# Patient Record
Sex: Male | Born: 1970 | Race: White | Hispanic: No | Smoking: Never smoker
Health system: Southern US, Community
[De-identification: ages and names within clinical notes are randomized; demographics above are authoritative.]

## PROBLEM LIST (undated history)

## (undated) DIAGNOSIS — K5903 Drug induced constipation: Secondary | ICD-10-CM

## (undated) DIAGNOSIS — G809 Cerebral palsy, unspecified: Secondary | ICD-10-CM

## (undated) DIAGNOSIS — F419 Anxiety disorder, unspecified: Secondary | ICD-10-CM

## (undated) DIAGNOSIS — J189 Pneumonia, unspecified organism: Secondary | ICD-10-CM

## (undated) DIAGNOSIS — K224 Dyskinesia of esophagus: Secondary | ICD-10-CM

## (undated) DIAGNOSIS — J45909 Unspecified asthma, uncomplicated: Secondary | ICD-10-CM

## (undated) DIAGNOSIS — L0390 Cellulitis, unspecified: Secondary | ICD-10-CM

## (undated) DIAGNOSIS — G8250 Quadriplegia, unspecified: Secondary | ICD-10-CM

## (undated) DIAGNOSIS — I5032 Chronic diastolic (congestive) heart failure: Secondary | ICD-10-CM

## (undated) DIAGNOSIS — Z9109 Other allergy status, other than to drugs and biological substances: Secondary | ICD-10-CM

## (undated) DIAGNOSIS — K222 Esophageal obstruction: Secondary | ICD-10-CM

## (undated) DIAGNOSIS — I10 Essential (primary) hypertension: Secondary | ICD-10-CM

## (undated) DIAGNOSIS — Z9229 Personal history of other drug therapy: Secondary | ICD-10-CM

## (undated) DIAGNOSIS — E119 Type 2 diabetes mellitus without complications: Secondary | ICD-10-CM

## (undated) DIAGNOSIS — J302 Other seasonal allergic rhinitis: Secondary | ICD-10-CM

## (undated) DIAGNOSIS — K219 Gastro-esophageal reflux disease without esophagitis: Secondary | ICD-10-CM

## (undated) HISTORY — DX: Anxiety disorder, unspecified: F41.9

## (undated) HISTORY — PX: ESOPHAGUS SURGERY: SHX626

## (undated) HISTORY — DX: Cerebral palsy, unspecified: G80.9

## (undated) HISTORY — PX: TENDON RELEASE: SHX230

## (undated) HISTORY — DX: Drug induced constipation: K59.03

## (undated) HISTORY — DX: Unspecified asthma, uncomplicated: J45.909

## (undated) HISTORY — DX: Dyskinesia of esophagus: K22.4

## (undated) HISTORY — DX: Esophageal obstruction: K22.2

## (undated) HISTORY — DX: Quadriplegia, unspecified: G82.50

## (undated) HISTORY — PX: MOUTH SURGERY: SHX715

## (undated) HISTORY — PX: OTHER SURGICAL HISTORY: SHX169

---

## 1999-06-12 ENCOUNTER — Ambulatory Visit (HOSPITAL_COMMUNITY): Admission: RE | Admit: 1999-06-12 | Discharge: 1999-06-12 | Payer: Self-pay | Admitting: *Deleted

## 2005-06-17 ENCOUNTER — Encounter: Admission: RE | Admit: 2005-06-17 | Discharge: 2005-07-24 | Payer: Self-pay | Admitting: Internal Medicine

## 2005-10-14 ENCOUNTER — Encounter: Admission: RE | Admit: 2005-10-14 | Discharge: 2005-10-14 | Payer: Self-pay | Admitting: Unknown Physician Specialty

## 2007-07-14 ENCOUNTER — Encounter: Admission: RE | Admit: 2007-07-14 | Discharge: 2007-10-12 | Payer: Self-pay | Admitting: Occupational Therapy

## 2007-12-23 ENCOUNTER — Encounter: Admission: RE | Admit: 2007-12-23 | Discharge: 2008-03-15 | Payer: Self-pay | Admitting: Internal Medicine

## 2008-09-07 ENCOUNTER — Encounter: Admission: RE | Admit: 2008-09-07 | Discharge: 2008-12-06 | Payer: Self-pay | Admitting: Ophthalmology

## 2009-06-21 ENCOUNTER — Ambulatory Visit: Payer: Self-pay | Admitting: Family Medicine

## 2009-07-26 ENCOUNTER — Ambulatory Visit: Payer: Self-pay | Admitting: Internal Medicine

## 2009-08-13 ENCOUNTER — Encounter
Admission: RE | Admit: 2009-08-13 | Discharge: 2009-09-09 | Payer: Self-pay | Admitting: Physical Medicine and Rehabilitation

## 2009-08-26 ENCOUNTER — Ambulatory Visit: Payer: Self-pay | Admitting: Internal Medicine

## 2009-08-26 ENCOUNTER — Encounter (INDEPENDENT_AMBULATORY_CARE_PROVIDER_SITE_OTHER): Payer: Self-pay | Admitting: Family Medicine

## 2009-08-26 LAB — CONVERTED CEMR LAB
ALT: 79 units/L — ABNORMAL HIGH (ref 0–53)
AST: 41 units/L — ABNORMAL HIGH (ref 0–37)
Albumin: 4.6 g/dL (ref 3.5–5.2)
Alkaline Phosphatase: 78 units/L (ref 39–117)
BUN: 16 mg/dL (ref 6–23)
Basophils Absolute: 0 10*3/uL (ref 0.0–0.1)
Basophils Relative: 0 % (ref 0–1)
CO2: 28 meq/L (ref 19–32)
Calcium: 9.8 mg/dL (ref 8.4–10.5)
Chloride: 102 meq/L (ref 96–112)
Cholesterol: 199 mg/dL (ref 0–200)
Creatinine, Ser: 0.63 mg/dL (ref 0.40–1.50)
Eosinophils Absolute: 0.1 10*3/uL (ref 0.0–0.7)
Eosinophils Relative: 2 % (ref 0–5)
Glucose, Bld: 100 mg/dL — ABNORMAL HIGH (ref 70–99)
HCT: 47.7 % (ref 39.0–52.0)
HDL: 29 mg/dL — ABNORMAL LOW (ref >39)
Hemoglobin: 15.5 g/dL (ref 13.0–17.0)
LDL Cholesterol: 127 mg/dL — ABNORMAL HIGH (ref 0–99)
Lymphocytes Relative: 35 % (ref 12–46)
Lymphs Abs: 2.5 10*3/uL (ref 0.7–4.0)
MCHC: 32.5 g/dL (ref 30.0–36.0)
MCV: 90.7 fL (ref 78.0–100.0)
Monocytes Absolute: 0.5 10*3/uL (ref 0.1–1.0)
Monocytes Relative: 6 % (ref 3–12)
Neutro Abs: 4.1 10*3/uL (ref 1.7–7.7)
Neutrophils Relative %: 57 % (ref 43–77)
Platelets: 218 10*3/uL (ref 150–400)
Potassium: 3.8 meq/L (ref 3.5–5.3)
RBC: 5.26 M/uL (ref 4.22–5.81)
RDW: 13.1 % (ref 11.5–15.5)
Sodium: 142 meq/L (ref 135–145)
Total Bilirubin: 0.5 mg/dL (ref 0.3–1.2)
Total CHOL/HDL Ratio: 6.9
Total Protein: 7.3 g/dL (ref 6.0–8.3)
Triglycerides: 215 mg/dL — ABNORMAL HIGH (ref <150)
VLDL: 43 mg/dL — ABNORMAL HIGH (ref 0–40)
WBC: 7.2 10*3/uL (ref 4.0–10.5)

## 2010-04-17 ENCOUNTER — Encounter (INDEPENDENT_AMBULATORY_CARE_PROVIDER_SITE_OTHER): Payer: Self-pay | Admitting: *Deleted

## 2010-04-17 LAB — CONVERTED CEMR LAB
ALT: 21 units/L (ref 0–53)
AST: 17 units/L (ref 0–37)
Albumin: 4.5 g/dL (ref 3.5–5.2)
Alkaline Phosphatase: 72 units/L (ref 39–117)
BUN: 15 mg/dL (ref 6–23)
CO2: 27 meq/L (ref 19–32)
Calcium: 9.8 mg/dL (ref 8.4–10.5)
Chloride: 104 meq/L (ref 96–112)
Cholesterol: 194 mg/dL (ref 0–200)
Cortisol, Plasma: 7.5 ug/dL
Creatinine, Ser: 0.63 mg/dL (ref 0.40–1.50)
Glucose, Bld: 87 mg/dL (ref 70–99)
HDL: 27 mg/dL — ABNORMAL LOW (ref >39)
LDL Cholesterol: 132 mg/dL — ABNORMAL HIGH (ref 0–99)
Magnesium: 1.9 mg/dL (ref 1.5–2.5)
Phosphorus: 2.8 mg/dL (ref 2.3–4.6)
Potassium: 3.7 meq/L (ref 3.5–5.3)
Sodium: 143 meq/L (ref 135–145)
TSH: 0.564 microintl units/mL (ref 0.350–4.500)
Total Bilirubin: 0.6 mg/dL (ref 0.3–1.2)
Total CHOL/HDL Ratio: 7.2
Total Protein: 7 g/dL (ref 6.0–8.3)
Triglycerides: 173 mg/dL — ABNORMAL HIGH (ref <150)
VLDL: 35 mg/dL (ref 0–40)
Vitamin B-12: >2000 pg/mL — ABNORMAL HIGH (ref 211–911)

## 2010-04-18 ENCOUNTER — Encounter: Payer: Self-pay | Admitting: Internal Medicine

## 2010-07-21 ENCOUNTER — Other Ambulatory Visit (HOSPITAL_COMMUNITY): Payer: Self-pay | Admitting: Family Medicine

## 2010-07-21 DIAGNOSIS — R1310 Dysphagia, unspecified: Secondary | ICD-10-CM

## 2010-07-23 ENCOUNTER — Ambulatory Visit (HOSPITAL_COMMUNITY)
Admission: RE | Admit: 2010-07-23 | Discharge: 2010-07-23 | Disposition: A | Discharge status: Home / Self Care | Payer: Medicare Other | Source: Ambulatory Visit | Attending: Family Medicine | Admitting: Family Medicine

## 2010-07-23 DIAGNOSIS — K224 Dyskinesia of esophagus: Secondary | ICD-10-CM | POA: Insufficient documentation

## 2010-07-23 DIAGNOSIS — R1310 Dysphagia, unspecified: Secondary | ICD-10-CM

## 2010-08-06 ENCOUNTER — Encounter: Payer: Self-pay | Admitting: Gastroenterology

## 2010-09-16 ENCOUNTER — Ambulatory Visit (INDEPENDENT_AMBULATORY_CARE_PROVIDER_SITE_OTHER): Payer: Medicare Other | Admitting: Gastroenterology

## 2010-09-16 ENCOUNTER — Encounter: Payer: Self-pay | Admitting: Gastroenterology

## 2010-09-16 DIAGNOSIS — R1310 Dysphagia, unspecified: Secondary | ICD-10-CM | POA: Insufficient documentation

## 2010-09-16 DIAGNOSIS — G809 Cerebral palsy, unspecified: Secondary | ICD-10-CM

## 2010-09-16 NOTE — Progress Notes (Signed)
History of Present Illness:  Thomas Ramos is a 40 year old white male with cerebral palsy referred at the request of Dr. Clelia Croft for evaluation of dysphagia. He apparently has had dysphagia to both solids and liquids over the past year. He denies pyrosis. Recent barium swallow, which I reviewed, demonstrated a dilated esophagus with a narrowed fixed area in the distal esophagus. There was hangup of the barium pill. Questionable filling defects are also seen in the distal esophagus. Weight has been stable.    Review of Systems: Pertinent positive and negative review of systems were noted in the above HPI section. All other review of systems were otherwise negative.    Current Medications, Allergies, Past Medical History, Past Surgical History, Family History and Social History were reviewed in [employer] electronic medical record  Vital signs were reviewed in today's medical record. Physical Exam: General: Well developed , well nourished, no acute distress with some contractures sitting in a wheelchair Head: Normocephalic and atraumatic Eyes:  sclerae anicteric, EOMI Ears: Normal auditory acuity Mouth: No deformity or lesions Lungs: Clear throughout to auscultation Heart: Regular rate and rhythm; no murmurs, rubs or bruits Abdomen: Soft, non tender and non distended. No masses, hepatosplenomegaly or hernias noted. Normal Bowel sounds Rectal:deferred Musculoskeletal: Symmetrical with no gross deformities  Pulses:  Normal pulses noted Extremities: No clubbing, cyanosis, edema; there is some contractures of his upper and lower extremities Neurological: Alert oriented x 4, grossly nonfocal Psychological:  Alert and cooperative. Normal mood and affect

## 2010-09-16 NOTE — Assessment & Plan Note (Addendum)
I suspect the patient may have achalasia. Secondary achalasia from a neoplasm needs to be ruled out in view of a possible mucosal abnormality seen by barium swallow. A fixed esophageal stricture is less likely.  Recommendations #1 upper endoscopy with possible Botox injection versus balloon dilatation.  He will require sedation with propofol  Risks, alternatives, and complications of the procedure, including bleeding, perforation, and possible need for surgery, were explained to the patient.  Patient's questions were answered.

## 2010-10-28 ENCOUNTER — Ambulatory Visit (HOSPITAL_COMMUNITY)
Admission: RE | Admit: 2010-10-28 | Discharge: 2010-10-28 | Disposition: A | Discharge status: Home / Self Care | Payer: Medicare Other | Source: Ambulatory Visit | Attending: Gastroenterology | Admitting: Gastroenterology

## 2010-10-28 ENCOUNTER — Encounter: Payer: Medicare Other | Admitting: Gastroenterology

## 2010-10-28 DIAGNOSIS — G809 Cerebral palsy, unspecified: Secondary | ICD-10-CM | POA: Insufficient documentation

## 2010-10-28 DIAGNOSIS — K222 Esophageal obstruction: Secondary | ICD-10-CM | POA: Insufficient documentation

## 2010-10-28 DIAGNOSIS — I10 Essential (primary) hypertension: Secondary | ICD-10-CM | POA: Insufficient documentation

## 2010-10-28 DIAGNOSIS — R1310 Dysphagia, unspecified: Secondary | ICD-10-CM | POA: Insufficient documentation

## 2010-10-28 DIAGNOSIS — Z79899 Other long term (current) drug therapy: Secondary | ICD-10-CM | POA: Insufficient documentation

## 2010-10-28 DIAGNOSIS — K220 Achalasia of cardia: Secondary | ICD-10-CM

## 2010-12-08 ENCOUNTER — Encounter: Payer: Self-pay | Admitting: Gastroenterology

## 2010-12-08 ENCOUNTER — Ambulatory Visit (INDEPENDENT_AMBULATORY_CARE_PROVIDER_SITE_OTHER): Payer: Medicare Other | Admitting: Gastroenterology

## 2010-12-08 VITALS — BP 102/76 | HR 56

## 2010-12-08 DIAGNOSIS — R1310 Dysphagia, unspecified: Secondary | ICD-10-CM

## 2010-12-08 NOTE — Progress Notes (Signed)
Thomas Ramos has returned following Botox injection of his LES. Endoscopic findings were most compatible with achalasia. He reports significant improvement in his swallowing since the injection.

## 2010-12-08 NOTE — Patient Instructions (Signed)
Follow up as needed

## 2010-12-08 NOTE — Assessment & Plan Note (Addendum)
He probably has achalasia. Findings were discussed and therapeutic options were reviewed including repeat Botox injection, forceful balloon dilatation of the LES and surgical myotomy. It was decided to repeat his Botox injection as needed for the time being, in view of his other medical problems. Should he require frequent injections, that is every 3 to 4 months, then I would consider a more permanent therapy by surgical myotomy.

## 2011-11-17 ENCOUNTER — Other Ambulatory Visit: Payer: Self-pay | Admitting: Dentistry

## 2011-11-17 NOTE — Progress Notes (Signed)
1630 Tuesday.Marland KitchenMarland KitchenI have called the office requesting "consent to read" and labs...Marland KitchenDA

## 2011-11-17 NOTE — Pre-Procedure Instructions (Addendum)
20 Thomas Ramos   11/17/2011   Your procedure is scheduled on: Tuesday, September 10th   Report to [facility] at  5:30 AM.   Call this number if you have problems the morning of surgery: [phone]   Remember:   Do not eat food or drink any liquids:After Midnight Monday   Take these medicines the morning of surgery with A SIP OF WATER: Proair, Celexa, Flovent,             Prilosec & Inderal   Do not wear jewelry,.Do not wear lotions, powders, or cologne. You may NOT wear deodorant.   Ladies--Do not shave 48 hours prior to surgery. Men may shave face and neck.   Do not bring valuables to the hospital.   Contacts, dentures or bridgework may not be worn into surgery.  Leave suitcase in the car. After surgery it may be brought to your room.  For patients admitted to the hospital, checkout time is 11:00 AM the day of discharge.   Patients discharged the day of surgery will not be allowed to drive home.              Someone will need to stay overnight with you for the first 24 hours.   Name and phone number of your driver:      Special Instructions: CHG Shower Use Special Wash: 1/2 bottle night before surgery and 1/2 bottle morning of surgery.   Please read over the following fact sheets that you were given: Pain Booklet, Coughing and Deep Breathing and Surgical Site Infection Prevention

## 2011-11-18 ENCOUNTER — Encounter (HOSPITAL_COMMUNITY)
Admission: RE | Admit: 2011-11-18 | Discharge: 2011-11-18 | Payer: Medicare Other | Source: Ambulatory Visit | Attending: Dentistry | Admitting: Dentistry

## 2011-11-19 ENCOUNTER — Encounter (HOSPITAL_COMMUNITY): Payer: Self-pay | Admitting: Pharmacy Technician

## 2011-11-19 ENCOUNTER — Encounter (HOSPITAL_COMMUNITY)
Admission: RE | Admit: 2011-11-19 | Discharge: 2011-11-19 | Disposition: A | Discharge status: Home / Self Care | Payer: Medicare Other | Source: Ambulatory Visit | Attending: Dentistry | Admitting: Dentistry

## 2011-11-19 ENCOUNTER — Encounter (HOSPITAL_COMMUNITY): Payer: Self-pay

## 2011-11-19 ENCOUNTER — Ambulatory Visit (HOSPITAL_COMMUNITY)
Admission: RE | Admit: 2011-11-19 | Discharge: 2011-11-19 | Disposition: A | Discharge status: Home / Self Care | Payer: Medicare Other | Source: Ambulatory Visit | Attending: Dentistry | Admitting: Dentistry

## 2011-11-19 ENCOUNTER — Ambulatory Visit (HOSPITAL_COMMUNITY): Admission: RE | Admit: 2011-11-19 | Payer: Medicare Other | Source: Ambulatory Visit

## 2011-11-19 DIAGNOSIS — Z01812 Encounter for preprocedural laboratory examination: Secondary | ICD-10-CM | POA: Insufficient documentation

## 2011-11-19 DIAGNOSIS — Z01818 Encounter for other preprocedural examination: Secondary | ICD-10-CM | POA: Insufficient documentation

## 2011-11-19 DIAGNOSIS — Z01810 Encounter for preprocedural cardiovascular examination: Secondary | ICD-10-CM | POA: Insufficient documentation

## 2011-11-19 HISTORY — DX: Essential (primary) hypertension: I10

## 2011-11-19 HISTORY — DX: Other allergy status, other than to drugs and biological substances: Z91.09

## 2011-11-19 HISTORY — DX: Other seasonal allergic rhinitis: J30.2

## 2011-11-19 HISTORY — DX: Gastro-esophageal reflux disease without esophagitis: K21.9

## 2011-11-19 HISTORY — DX: Pneumonia, unspecified organism: J18.9

## 2011-11-19 HISTORY — DX: Personal history of other drug therapy: Z92.29

## 2011-11-19 HISTORY — DX: Cerebral palsy, unspecified: G80.9

## 2011-11-19 LAB — BASIC METABOLIC PANEL
Chloride: 95 mEq/L — ABNORMAL LOW (ref 96–112)
Creatinine, Ser: 0.57 mg/dL (ref 0.50–1.35)
GFR calc Af Amer: >90 mL/min (ref >90)
Potassium: 2.9 mEq/L — ABNORMAL LOW (ref 3.5–5.1)
Sodium: 137 mEq/L (ref 135–145)

## 2011-11-19 LAB — CBC
HCT: 42.7 % (ref 39.0–52.0)
RBC: 5.1 MIL/uL (ref 4.22–5.81)
RDW: 12.3 % (ref 11.5–15.5)
WBC: 8.8 10*3/uL (ref 4.0–10.5)

## 2011-11-19 NOTE — Progress Notes (Signed)
PCP was Dr. Clelia Croft # [phone] however office has closed and Nurse was unable to obtain records. Patients new PCP will be Georgette Shell, PA-C and patient has a upcoming appointment.

## 2011-11-19 NOTE — Pre-Procedure Instructions (Signed)
20 Thomas Ramos  11/19/2011   Your procedure is scheduled on:  Tuesday November 24, 2011.  Report to [facility] at 0530 AM.  Call this number if you have problems the morning of surgery: [phone]   Remember:   Do not eat food or drink:After Midnight.    Take these medicines the morning of surgery with A SIP OF WATER: Albuterol inhaler, Amlodipine (Norvasc), Citalopram (Celexa), Flovent inhaler, Omeprazole (Prilosec), Propranolol (Inderal)   Do not wear jewelry  Do not wear lotions or colognes.  Men may shave face and neck.  Do not bring valuables to the hospital.  Contacts, dentures or bridgework may not be worn into surgery.  Leave suitcase in the car. After surgery it may be brought to your room.  For patients admitted to the hospital, checkout time is 11:00 AM the day of discharge.   Patients discharged the day of surgery will not be allowed to drive home.  Name and phone number of your driver:   Special Instructions: CHG Shower Use Special Wash: 1/2 bottle night before surgery and 1/2 bottle morning of surgery.   Please read over the following fact sheets that you were given: Pain Booklet, Coughing and Deep Breathing, MRSA Information and Surgical Site Infection Prevention

## 2011-11-19 NOTE — Progress Notes (Signed)
Nurse called Dr. Janyce Llanos office and spoke with physician and informed him that patient did not have orders in EPIC.

## 2011-11-20 NOTE — Consult Note (Signed)
Anesthesia Chart Review:  Patient is a 41 year old male posted dental restoration/extraction with xray teeth by Dr. Martie Round on 11/24/11.  History includes former smoker, HTN, GERD, cerebral palsy, spastic quadriplegia.  Neurology notes indicate that he is wheelchair bound, but he can feed himself and go to the bathroom with assistance.  He saw Dr. Levert Feinstein in January of this year for Botox injections in his RUE from spasticity.  He has also seen Dr. Arlyce Dice for endoscopic findings most compatible with achalasia that has also been treated with Botox last September.  Labs noted.  K 2.9.  Cr 0.57.  Will get an ISTAT on arrival to re-evaluate for hypokalemia.    CXR on 11/19/11 showed no acute cardiopulmonary disease.  EKG on 11/19/11 showed NSR, anterior T wave abnormality, consider ischemia.  He had T wave inversion in V1-2 and a flat T wave in V3 on EKG on 06/12/99.  He now has a negative T wave in V3, but otherwise his EKG appears stable.  I spoke with patient's PAT RN.  Patient was previously seen by Dr. Clelia Croft, but that office is now closed (? [facility]).  He is scheduled to see Georgette Shell, PA-C [phone]) on 11/23/11 @ 1445 to get established and for his preoperative H&P.  I have faxed his labs, EKG, and CXR for her review. Chales Abrahams at Dr. Enrique Sack office is aware.  If no contraindications for surgery identified during his pre-operative H&P visit and his follow-up K+ is reasonable, then anticipate he can proceed as planned.  Reviewed with Anesthesiologist Dr. Malen Gauze.  Shonna Chock, PA-C

## 2011-11-24 ENCOUNTER — Encounter (HOSPITAL_COMMUNITY): Payer: Self-pay | Admitting: Vascular Surgery

## 2011-11-24 ENCOUNTER — Ambulatory Visit (HOSPITAL_COMMUNITY): Payer: Medicare Other | Admitting: Vascular Surgery

## 2011-11-24 ENCOUNTER — Encounter (HOSPITAL_COMMUNITY)
Admission: RE | Disposition: A | Discharge status: Home / Self Care | Payer: Self-pay | Source: Ambulatory Visit | Attending: Dentistry

## 2011-11-24 ENCOUNTER — Ambulatory Visit (HOSPITAL_COMMUNITY)
Admission: RE | Admit: 2011-11-24 | Discharge: 2011-11-24 | Disposition: A | Discharge status: Home / Self Care | Payer: Medicare Other | Source: Ambulatory Visit | Attending: Dentistry | Admitting: Dentistry

## 2011-11-24 DIAGNOSIS — K219 Gastro-esophageal reflux disease without esophagitis: Secondary | ICD-10-CM | POA: Insufficient documentation

## 2011-11-24 DIAGNOSIS — F79 Unspecified intellectual disabilities: Secondary | ICD-10-CM | POA: Insufficient documentation

## 2011-11-24 DIAGNOSIS — K029 Dental caries, unspecified: Secondary | ICD-10-CM | POA: Insufficient documentation

## 2011-11-24 DIAGNOSIS — I10 Essential (primary) hypertension: Secondary | ICD-10-CM | POA: Insufficient documentation

## 2011-11-24 DIAGNOSIS — F88 Other disorders of psychological development: Secondary | ICD-10-CM | POA: Insufficient documentation

## 2011-11-24 LAB — POCT I-STAT 4, (NA,K, GLUC, HGB,HCT): Potassium: 3.3 mEq/L — ABNORMAL LOW (ref 3.5–5.1)

## 2011-11-24 SURGERY — DENTAL RESTORATION/EXTRACTION WITH X-RAY
Anesthesia: General | Site: Mouth | Wound class: Clean Contaminated

## 2011-11-24 MED ORDER — KETOROLAC TROMETHAMINE 30 MG/ML IJ SOLN
30.0000 mg | INTRAMUSCULAR | Status: DC
  Filled 2011-11-24: qty 1

## 2011-11-24 MED ORDER — LIDOCAINE-EPINEPHRINE 2 %-1:100000 IJ SOLN
INTRAMUSCULAR | Status: DC | PRN
  Administered 2011-11-24: 5 mL

## 2011-11-24 MED ORDER — KETOROLAC TROMETHAMINE 30 MG/ML IJ SOLN
INTRAMUSCULAR | Status: DC | PRN
Start: 2011-11-24 — End: 2011-11-24
  Administered 2011-11-24: 30 mg via INTRAVENOUS

## 2011-11-24 MED ORDER — SUCCINYLCHOLINE CHLORIDE 20 MG/ML IJ SOLN
INTRAMUSCULAR | Status: DC | PRN
  Administered 2011-11-24: 140 mg via INTRAVENOUS

## 2011-11-24 MED ORDER — ONDANSETRON HCL 4 MG/2ML IJ SOLN
INTRAMUSCULAR | Status: DC | PRN
  Administered 2011-11-24: 4 mg via INTRAVENOUS

## 2011-11-24 MED ORDER — CHLORHEXIDINE GLUCONATE 0.12 % MT SOLN
OROMUCOSAL | Status: DC | PRN
  Administered 2011-11-24: 15 mL via OROMUCOSAL

## 2011-11-24 MED ORDER — LIDOCAINE-EPINEPHRINE 2 %-1:100000 IJ SOLN
INTRAMUSCULAR | Status: AC
  Filled 2011-11-24: qty 1

## 2011-11-24 MED ORDER — OXYMETAZOLINE HCL 0.05 % NA SOLN
NASAL | Status: DC | PRN
  Administered 2011-11-24: 2 via NASAL

## 2011-11-24 MED ORDER — LIDOCAINE-EPINEPHRINE 2 %-1:100000 IJ SOLN
INTRAMUSCULAR | Status: AC
  Filled 2011-11-24: qty 3.4

## 2011-11-24 MED ORDER — LACTATED RINGERS IV SOLN
INTRAVENOUS | Status: DC | PRN
  Administered 2011-11-24: 08:00:00 via INTRAVENOUS

## 2011-11-24 MED ORDER — OXYMETAZOLINE HCL 0.05 % NA SOLN
NASAL | Status: DC | PRN
  Administered 2011-11-24: 1 via NASAL

## 2011-11-24 MED ORDER — CHLORHEXIDINE GLUCONATE 0.12 % MT SOLN
15.0000 mL | Freq: Once | OROMUCOSAL | Status: DC
  Filled 2011-11-24: qty 15

## 2011-11-24 MED ORDER — FENTANYL CITRATE 0.05 MG/ML IJ SOLN
INTRAMUSCULAR | Status: DC | PRN
  Administered 2011-11-24: 25 ug via INTRAVENOUS
  Administered 2011-11-24: 20 ug via INTRAVENOUS
  Administered 2011-11-24: 50 ug via INTRAVENOUS

## 2011-11-24 MED ORDER — PROPOFOL 10 MG/ML IV BOLUS
INTRAVENOUS | Status: DC | PRN
  Administered 2011-11-24: 200 mg via INTRAVENOUS

## 2011-11-24 MED ORDER — OXYMETAZOLINE HCL 0.05 % NA SOLN
NASAL | Status: AC
  Filled 2011-11-24: qty 15

## 2011-11-24 MED ORDER — HYDROMORPHONE HCL PF 1 MG/ML IJ SOLN: 0.2500 mg | INTRAMUSCULAR | Status: DC | PRN

## 2011-11-24 SURGICAL SUPPLY — 41 items
BLADE SURG 15 STRL LF DISP TIS (BLADE) IMPLANT
BLADE SURG 15 STRL SS (BLADE)
CANISTER SUCTION 2500CC (MISCELLANEOUS) ×2 IMPLANT
CLOTH BEACON ORANGE TIMEOUT ST (SAFETY) ×2 IMPLANT
CONT SPEC 4OZ CLIKSEAL STRL BL (MISCELLANEOUS) IMPLANT
COVER PROBE W GEL 5X96 (DRAPES) IMPLANT
COVER SURGICAL LIGHT HANDLE (MISCELLANEOUS) ×2 IMPLANT
COVER TABLE BACK 60X90 (DRAPES) ×2 IMPLANT
DECANTER SPIKE VIAL GLASS SM (MISCELLANEOUS) ×1 IMPLANT
DRAPE PROXIMA HALF (DRAPES) IMPLANT
ELECT COATED BLADE 2.86 ST (ELECTRODE) IMPLANT
ELECT REM PT RETURN 9FT ADLT (ELECTROSURGICAL)
ELECTRODE REM PT RTRN 9FT ADLT (ELECTROSURGICAL) IMPLANT
GAUZE PACKING FOLDED 2  STR (GAUZE/BANDAGES/DRESSINGS)
GAUZE PACKING FOLDED 2 STR (GAUZE/BANDAGES/DRESSINGS) IMPLANT
GAUZE SPONGE 2X2 8PLY STRL LF (GAUZE/BANDAGES/DRESSINGS) IMPLANT
GAUZE SPONGE 4X4 16PLY XRAY LF (GAUZE/BANDAGES/DRESSINGS) ×2 IMPLANT
GLOVE BIO SURGEON STRL SZ7.5 (GLOVE) ×2 IMPLANT
GOWN STRL NON-REIN LRG LVL3 (GOWN DISPOSABLE) ×4 IMPLANT
KIT BASIN OR (CUSTOM PROCEDURE TRAY) ×2 IMPLANT
KIT ROOM TURNOVER OR (KITS) ×2 IMPLANT
NDL 25GX 5/8IN NON SAFETY (NEEDLE) IMPLANT
NDL FILTER BLUNT 18X1 1/2 (NEEDLE) IMPLANT
NEEDLE 25GX 5/8IN NON SAFETY (NEEDLE) ×2 IMPLANT
NEEDLE 27GAX1X1/2 (NEEDLE) IMPLANT
NEEDLE FILTER BLUNT 18X 1/2SAF (NEEDLE)
NEEDLE FILTER BLUNT 18X1 1/2 (NEEDLE) IMPLANT
PAD ARMBOARD 7.5X6 YLW CONV (MISCELLANEOUS) ×4 IMPLANT
PENCIL BUTTON HOLSTER BLD 10FT (ELECTRODE) IMPLANT
SPONGE GAUZE 2X2 STER 10/PKG (GAUZE/BANDAGES/DRESSINGS)
SPONGE GAUZE 4X4 12PLY (GAUZE/BANDAGES/DRESSINGS) IMPLANT
SPONGE SURGIFOAM ABS GEL 12-7 (HEMOSTASIS) IMPLANT
SPONGE SURGIFOAM ABS GEL SZ50 (HEMOSTASIS) IMPLANT
SUT CHROMIC 3 0 PS 2 (SUTURE) ×1 IMPLANT
SYR CONTROL 10ML LL (SYRINGE) ×2 IMPLANT
TOOTHBRUSH ADULT (PERSONAL CARE ITEMS) ×1 IMPLANT
TOWEL OR 17X24 6PK STRL BLUE (TOWEL DISPOSABLE) IMPLANT
TOWEL OR 17X26 10 PK STRL BLUE (TOWEL DISPOSABLE) ×2 IMPLANT
TUBE CONNECTING 12X1/4 (SUCTIONS) ×2 IMPLANT
WATER STERILE IRR 1000ML POUR (IV SOLUTION) ×3 IMPLANT
YANKAUER SUCT BULB TIP NO VENT (SUCTIONS) ×2 IMPLANT

## 2011-11-24 NOTE — Preoperative (Signed)
Beta Blockers   Reason not to administer Beta Blockers:Not Applicable 

## 2011-11-24 NOTE — Anesthesia Procedure Notes (Signed)
Procedure Name: Intubation Date/Time: 11/24/2011 8:20 AM Performed by: Jefm Miles E Pre-anesthesia Checklist: Patient identified, Timeout performed, Emergency Drugs available, Suction available and Patient being monitored Patient Re-evaluated:Patient Re-evaluated prior to inductionOxygen Delivery Method: Circle system utilized Preoxygenation: Pre-oxygenation with 100% oxygen Intubation Type: IV induction and Rapid sequence Ventilation: Mask ventilation without difficulty Laryngoscope Size: Mac and 4 Nasal Tubes: Nasal Rae, Right and Nasal prep performed Tube size: 7.0 mm Number of attempts: 1 Airway Equipment and Method: Video-laryngoscopy Placement Confirmation: ETT inserted through vocal cords under direct vision,  breath sounds checked- equal and bilateral and positive ETCO2 Secured at: 27 cm Tube secured with: Tape Dental Injury: Teeth and Oropharynx as per pre-operative assessment  Difficulty Due To: Difficulty was anticipated, Difficult Airway- due to reduced neck mobility and Difficult Airway- due to limited oral opening Future Recommendations: Recommend- induction with short-acting agent, and alternative techniques readily available

## 2011-11-24 NOTE — Brief Op Note (Signed)
11/24/2011  11:00 AM  PATIENT:  Thomas Ramos  42 y.o. male  PRE-OPERATIVE DIAGNOSIS:  DENTAL CARIES  POST-OPERATIVE DIAGNOSIS:  DENTAL CARIES  PROCEDURE:  Procedure(s) (LRB) with comments: DENTAL RESTORATION/EXTRACTION WITH X-RAY (N/A) - WITH CLEANING Recall exam, prophy, fillings, x-rays SURGEON:  Surgeon(s) and Role:    * Esaw Dace., DDS - Primary  PHYSICIAN ASSISTANT:   ASSISTANTS: Laqueta Carina ANESTHESIA:   general  EBL:  Total I/O In: 700 [I.V.:700] Out: -   BLOOD ADMINISTERED:none  DRAINS: none   LOCAL MEDICATIONS USED:  LIDOCAINE   SPECIMEN:  No Specimen  DISPOSITION OF SPECIMEN:  N/A  COUNTS:  YES  TOURNIQUET:  * No tourniquets in log *  DICTATION: .Note written in EPIC  PLAN OF CARE: Discharge to home after PACU  PATIENT DISPOSITION:  PACU - hemodynamically stable.   Delay start of Pharmacological VTE agent (>24hrs) due to surgical blood loss or risk of bleeding: not applicable

## 2011-11-24 NOTE — Anesthesia Postprocedure Evaluation (Signed)
  Anesthesia Post-op Note  Patient: Thomas Ramos  Procedure(s) Performed: Procedure(s) (LRB) with comments: DENTAL RESTORATION/EXTRACTION WITH X-RAY (N/A) - WITH CLEANING  Patient Location: PACU  Anesthesia Type: General  Level of Consciousness: awake  Airway and Oxygen Therapy: Patient Spontanous Breathing  Post-op Pain: mild  Post-op Assessment: Post-op Vital signs reviewed  Post-op Vital Signs: Reviewed  Complications: No apparent anesthesia complications

## 2011-11-24 NOTE — Anesthesia Preprocedure Evaluation (Addendum)
Anesthesia Evaluation  Patient identified by MRN, date of birth, ID band Patient awake    Reviewed: Allergy & Precautions, H&P , NPO status , Patient's Chart, lab work & pertinent test results  Airway Mallampati: IV TM Distance: >3 FB   Mouth opening: Limited Mouth Opening  Dental  (+) Missing and Dental Advisory Given   Pulmonary pneumonia -,  breath sounds clear to auscultation        Cardiovascular hypertension, Rhythm:Regular Rate:Normal     Neuro/Psych  Neuromuscular disease    GI/Hepatic Neg liver ROS, GERD-  Medicated,  Endo/Other  negative endocrine ROS  Renal/GU negative Renal ROS     Musculoskeletal   Abdominal   Peds  Hematology   Anesthesia Other Findings   Reproductive/Obstetrics                          Anesthesia Physical Anesthesia Plan  ASA: III  Anesthesia Plan: General   Post-op Pain Management:    Induction: Intravenous  Airway Management Planned: Nasal ETT  Additional Equipment:   Intra-op Plan:   Post-operative Plan: Extubation in OR  Informed Consent:   Dental advisory given  Plan Discussed with: CRNA, Anesthesiologist and Surgeon  Anesthesia Plan Comments:         Anesthesia Quick Evaluation

## 2011-11-24 NOTE — Op Note (Signed)
Recall Exam, X-rays, Prophy, Amalgams completed on #2,4,5,15,18,19,31 with 5 cc 2%Lidocaine 1:100,000 epi

## 2011-11-24 NOTE — H&P (Signed)
  Stable for surgery

## 2011-11-24 NOTE — Transfer of Care (Signed)
Immediate Anesthesia Transfer of Care Note  Patient: Thomas Ramos  Procedure(s) Performed: Procedure(s) (LRB) with comments: DENTAL RESTORATION/EXTRACTION WITH X-RAY (N/A) - WITH CLEANING  Patient Location: PACU  Anesthesia Type: General  Level of Consciousness: awake, alert  and oriented  Airway & Oxygen Therapy: Patient Spontanous Breathing and Patient connected to face mask oxygen  Post-op Assessment: Report given to PACU RN  Post vital signs: Reviewed and stable  Complications: No apparent anesthesia complications

## 2011-11-26 NOTE — Op Note (Signed)
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  Dental care provided in the OR for medically necessary treatment.  OPERATION:  Full mouth oral rehabilitation including exam, x-rays, cleaning, and operative care.  SURGEON:  Azucena Freed, D.D.S.  ASSISTANT:  Laqueta Carina and hospital staff.  ANESTHESIA:  General.  PROCEDURE:  The patient was brought into the operating room and placed in the supine position.  General anesthesia was administered via nasal intubation.  The patient was prepped and draped in the usual manner for an intraoral general dentistry procedure.  Oropharynx was suctioned and a moistened posterior throat pack was placed.  A full intraoral exam including all hard and soft tissues was performed.  This was a recall exam.  Soft tissue exam reveals the floor of the mouth, buccal mucosa, soft palate, hard palate, tongue, gingiva, and frenum attachments all within normal limits with regard to size, color, and consistency.  The hard tissue exam reveals tooth #1 missing, 2 present, 3 missing, 4 through 11 present, 12 through 14 missing, 15 present, 16 and 17 missing, 18 and 19 present, 20 missing, 21 through 29 present, 30 missing, 31 present, 32 missing.  Full mouth series of digital x-rays was taken.  Prophy was done.  Operative care was provided with high- speed handpiece #557 bur and #4 bur and a slow speed handpiece #4 bur. Amalgams were completed on #2 O, L; #4 O; #5 D, O, L; #15 M, O, L; 18 M, O, D, F, L; 19, O and 31 D, O, M.  A 5 mL of 2% lidocaine with 1:100, 000  epinephrine was given.  Estimated blood loss of 10 mL, 30 mg of Toradol was given during surgery for postoperative pain.  The mouth was suctioned dry and a posterior throat pack was carefully removed with constant suction.  The patient was awakened in the OR and transferred to the recovery room in good condition.     Esaw Dace., D.D.S.     WEM/MEDQ  D:  11/25/2011  T:  11/26/2011  Job:  [id number]

## 2011-12-02 NOTE — H&P (Signed)
Patient ok for anesthesia.

## 2011-12-31 ENCOUNTER — Ambulatory Visit (INDEPENDENT_AMBULATORY_CARE_PROVIDER_SITE_OTHER): Payer: Medicare Other | Admitting: Physician Assistant

## 2011-12-31 ENCOUNTER — Ambulatory Visit: Payer: Medicare Other | Admitting: Gastroenterology

## 2011-12-31 ENCOUNTER — Encounter: Payer: Self-pay | Admitting: Physician Assistant

## 2011-12-31 VITALS — BP 122/76 | HR 80

## 2011-12-31 DIAGNOSIS — K219 Gastro-esophageal reflux disease without esophagitis: Secondary | ICD-10-CM

## 2011-12-31 DIAGNOSIS — K220 Achalasia of cardia: Secondary | ICD-10-CM

## 2011-12-31 NOTE — Patient Instructions (Addendum)
We have given you samples of the following medication to take: Prilosec 20 mg. Please take one by mouth once daily   Please follow up as needed

## 2011-12-31 NOTE — Progress Notes (Signed)
Subjective:    Patient ID: Thomas Ramos, male    DOB: 1970-04-16, 41 y.o.   MRN: [id number]  HPI Thomas Ramos is a pleasant 41 year old white male with cerebral palsy known to Dr. Arlyce Dice from prior endoscopy. He was last seen about a year ago and at that time had been diagnosed with probable achalasia. He had had a barium swallow showing a dilated esophagus with fixed narrowing in the distal esophagus. EGD was done 10/28/2010 was questionable stenosis at the GE junction but the scope passed easily, he was injected with Botox in each quadrant . When seen in followup he had had improvement and had no complaints of dysphagia. He comes in today for followup stating that he was having problems with his stomach within the past couple of months with some increased reflux symptoms and increased frequency of stooling. He denies any dysphagia or odynophagia. He remains on Prilosec 20 mg by mouth every morning He states that he was living in a bad social situation and was anxious all the time feels that that was causing his GI symptoms. He has since moved into a much calmer household and says that he has no current complaints.    Review of Systems  Constitutional: Negative.   HENT: Negative.   Eyes: Negative.   Respiratory: Negative.   Cardiovascular: Negative.   Gastrointestinal: Negative.   Genitourinary: Negative.   Musculoskeletal: Positive for gait problem.  Skin: Negative.   Neurological: Negative.   Hematological: Negative.   Psychiatric/Behavioral: The patient is nervous/anxious.    Outpatient Prescriptions Prior to Visit  Medication Sig Dispense Refill  . albuterol (PROAIR HFA) 108 (90 BASE) MCG/ACT inhaler Inhale 2 puffs into the lungs every 6 (six) hours as needed. For shortness of breath      . amLODipine (NORVASC) 5 MG tablet Take 5 mg by mouth daily.        Marland Kitchen docusate sodium (COLACE) 100 MG capsule Take 100 mg by mouth 2 (two) times daily as needed. For constipation      . hydrochlorothiazide  25 MG tablet Take 25 mg by mouth daily.        . Multiple Vitamin (MULTIVITAMIN) tablet Take 1 tablet by mouth daily.        Marland Kitchen omeprazole (PRILOSEC) 20 MG capsule Take 20 mg by mouth daily.        . citalopram (CELEXA) 20 MG tablet Take 20 mg by mouth daily.      Marland Kitchen loratadine (CLARITIN) 10 MG tablet Take 10 mg by mouth daily as needed. For allergies      . polycarbophil (FIBERCON) 625 MG tablet Take 625 mg by mouth 2 (two) times daily.         Allergies  Allergen Reactions  . Penicillins    Patient Active Problem List  Diagnosis  . Dysphagia, unspecified  . Congenital cerebral palsy   History   Social History  . Marital Status: Married    Spouse Name: N/A    Number of Children: N/A  . Years of Education: N/A   Occupational History  . Disabled    Social History Main Topics  . Smoking status: Former Games developer  . Smokeless tobacco: Never Used  . Alcohol Use: Yes     rare  . Drug Use: No  . Sexually Active: Not on file   Other Topics Concern  . Not on file   Social History Narrative  . No narrative on file       Objective:  Physical Exam well-developed white male with cerebral palsy in a motorized wheelchair he has significant spasticity but cognitively is intact blood pressure 122/76 pulse 80. HEENT; nontraumatic normocephalic he has some spasticity of his ocular musculature. 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 He is currently asymptomatic #2 unspecified GI upset -probable IBS exacerbated by  anxiety and poor living situation. 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 He is currently asymptomatic #2 unspecified GI upset -probable IBS exacerbated by  anxiety and poor living situation. Currently asymptomatic since a recent move  Plan; continue Prilosec 20 mg by mouth every morning Followup with GI on an as-needed basis

## 2012-01-01 NOTE — Progress Notes (Signed)
Reviewed and agree with management. Robert D. Kaplan, M.D., FACG  

## 2012-02-22 DIAGNOSIS — E785 Hyperlipidemia, unspecified: Secondary | ICD-10-CM | POA: Insufficient documentation

## 2012-06-01 ENCOUNTER — Encounter: Payer: Self-pay | Admitting: Neurology

## 2012-06-01 ENCOUNTER — Ambulatory Visit (INDEPENDENT_AMBULATORY_CARE_PROVIDER_SITE_OTHER): Payer: Medicaid Other | Admitting: Neurology

## 2012-06-01 DIAGNOSIS — R259 Unspecified abnormal involuntary movements: Secondary | ICD-10-CM

## 2012-06-01 DIAGNOSIS — G808 Other cerebral palsy: Secondary | ICD-10-CM

## 2012-06-01 DIAGNOSIS — Z993 Dependence on wheelchair: Secondary | ICD-10-CM

## 2012-06-01 DIAGNOSIS — R252 Cramp and spasm: Secondary | ICD-10-CM

## 2012-06-01 MED ORDER — ONABOTULINUMTOXINA 100 UNITS IJ SOLR
300.0000 [IU] | Freq: Once | INTRAMUSCULAR | Status: AC
  Administered 2012-06-01: 300 [IU] via INTRAMUSCULAR

## 2012-06-01 NOTE — Progress Notes (Signed)
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 He was able to feed himself with his left hand , able to go to bathroom with assistance , but he can not transfer himself in and out of wheelchair.  Over the past 2 years, he was under the care of cerebral palsy specialist Dr. Dan Humphreys, at [facility], for EMG guided Botox injection for the right upper extremity spasticity, he reported moderate improvement, he has less spastic right upper extremity pain after the injection. Last injection was in March 2012. He is not ambulatory, denies significant pain in bilateral lower extremity, with fixed contraction. He transferred his injection to our office for convenience.  He recently moved to a different living facility,  He is also taking baclofen 10 mg one in the morning, 2 at nighttime, complains of drowsiness with baclofen.  UPDATE March 19th 2014:  Last injection was in 03/03/2012,he was able to relax his right arm, there was no significant side effect, his father passed away in January 31, 2012.  Physical Exam  Neck: supple no carotid bruits Respiratory: clear to auscultation bilaterally Cardiovascular: regular rate rhythm  Neurologic Exam  Mental Status:  wheelchair bound, spastic slow talking, following commands Cranial Nerves: CN II-XII pupils were equal round reactive to light.  spontaneous nystagmus, conjugate eye movements, difficulty to hold right gaze,  he was able to read large print.   Facial sensation and strength were normal.    Uvula tongue were midline.  Head turning and shoulder shrugging were spastic and symmetric. difficulty with tongue protrusion. Motor:  spastic quadriplegia, relative free movement in left arm,  contracted right upper extremity and bilateral lower extremity,  right pectoralis muscle hypertrophy, tender, right arm stayed at right elbow flexion, fixed  at 160, elbow pronation, wrist flexion, finger flexion at metacarpophalangeal joints, thumb inposition,extended finger at proximal and distal interphalangeal joints. fixed  wrist flexion, fixed bilateral knee flexion. He has mild left hip flexion 2/5, right hip flexion 0 Sensory:  not reliable Coordination:  There was no dysmetria noticed. 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 (Lot [id number], exp Oct 2016), 100 units of BOTOX was dissolved into 2 cc of NS.  Right pectoralis major 50 units Right biceps 50 Right brachialis 50 Right  flexor carpi ulnaris  50 units Right flexor carpi radialis  50 Right flexor digitorum profundus 50 units  He tolerated the injection well, will return in 3 months for repeat injections.

## 2012-07-14 ENCOUNTER — Other Ambulatory Visit: Payer: Self-pay | Admitting: Family Medicine

## 2012-09-13 ENCOUNTER — Telehealth: Payer: Self-pay

## 2012-09-13 NOTE — Telephone Encounter (Signed)
Disregard, this is not WC/ not our patient. Pharmacy advised.

## 2012-09-13 NOTE — Telephone Encounter (Signed)
Does this patient have a WC account?

## 2012-09-13 NOTE — Telephone Encounter (Signed)
[facility] is calling to follow up on a rx refill they have sent over Call back number is [phone]

## 2012-10-05 ENCOUNTER — Telehealth: Payer: Self-pay | Admitting: Gastroenterology

## 2012-10-05 NOTE — Telephone Encounter (Signed)
Left message for pt to call back  °

## 2012-10-06 NOTE — Telephone Encounter (Signed)
Spoke with patient and scheduled OV on 10/14/12 at 10:30 AM with Dr. Arlyce Dice.

## 2012-10-14 ENCOUNTER — Ambulatory Visit: Payer: Medicare Other | Admitting: Gastroenterology

## 2012-10-24 ENCOUNTER — Telehealth: Payer: Self-pay | Admitting: Gastroenterology

## 2012-10-24 ENCOUNTER — Ambulatory Visit: Payer: Medicare Other | Admitting: Gastroenterology

## 2012-10-24 NOTE — Telephone Encounter (Signed)
Pts appt moved to 11/18/12@9 :30am. Pt aware.

## 2012-11-18 ENCOUNTER — Ambulatory Visit (INDEPENDENT_AMBULATORY_CARE_PROVIDER_SITE_OTHER): Payer: Medicare Other | Admitting: Gastroenterology

## 2012-11-18 ENCOUNTER — Encounter: Payer: Self-pay | Admitting: Gastroenterology

## 2012-11-18 VITALS — BP 130/80 | HR 82

## 2012-11-18 DIAGNOSIS — R1310 Dysphagia, unspecified: Secondary | ICD-10-CM

## 2012-11-18 NOTE — Progress Notes (Signed)
History of Present Illness: 42 year old male with history of cerebral palsy, achalasia here for evaluation of dysphagia.  Last injection was in 2012.  Over the past month he's had recurrent dysphagia with choking to solids and liquids.  Pyrosis has improved since resuming omeprazole.    Past Medical History  Diagnosis Date  . Motility disorder, esophageal   . Esophageal stricture   . Hypertension   . Pneumonia   . GERD (gastroesophageal reflux disease)   . CP (cerebral palsy)   . S/P Botox injection     approx every 4 months  . Seasonal allergies   . Environmental allergies     takes inhalers if needed  . Quadriplegic spinal paralysis   . Cerebral palsy    Past Surgical History  Procedure Laterality Date  . Tendon release    . Mouth surgery    . Esophagus surgery      stretched esophagus  . Legs     family history includes Diabetes in his mother; Hypertension in his father; Lung cancer in his father. Current Outpatient Prescriptions  Medication Sig Dispense Refill  . Acetaminophen (TYLENOL EXTRA STRENGTH PO) Take by mouth as needed.       Marland Kitchen albuterol (PROAIR HFA) 108 (90 BASE) MCG/ACT inhaler Inhale 2 puffs into the lungs every 6 (six) hours as needed. For shortness of breath      . atenolol (TENORMIN) 25 MG tablet Take by mouth. Takes two tablets by mouth twice daily      . budesonide (RHINOCORT AQUA) 32 MCG/ACT nasal spray Place 1 spray into the nose daily.      . cyclobenzaprine (FLEXERIL) 10 MG tablet Take 10 mg by mouth daily.      Marland Kitchen docusate sodium (COLACE) 100 MG capsule Take 100 mg by mouth 2 (two) times daily as needed. For constipation      . esomeprazole (NEXIUM) 40 MG capsule Take 40 mg by mouth daily before breakfast.      . hydrochlorothiazide 25 MG tablet Take 25 mg by mouth daily.        Marland Kitchen HYDROcodone-acetaminophen (VICODIN) 5-500 MG per tablet Take 1 tablet by mouth every 6 (six) hours as needed for pain.      . montelukast (SINGULAIR) 10 MG tablet Take 10 mg  by mouth at bedtime.      . Multiple Vitamin (MULTIVITAMIN) tablet Take 1 tablet by mouth daily.        . polycarbophil (FIBERCON) 625 MG tablet Take 625 mg by mouth 2 (two) times daily.        Marland Kitchen senna (SENOKOT) 8.6 MG tablet Take 1 tablet by mouth daily.      Satira Sark Johns Wort 450 MG CAPS Take 1 capsule by mouth daily.       No current facility-administered medications for this visit.   Allergies as of 11/18/2012 - Review Complete 11/18/2012  Allergen Reaction Noted  . Penicillins  09/16/2010    reports that he has quit smoking. He has quit using smokeless tobacco. He reports that  drinks alcohol. He reports that he does not use illicit drugs.     Review of Systems: Pertinent positive and negative review of systems were noted in the above HPI section. All other review of systems were otherwise negative.  Vital signs were reviewed in today's medical record Physical Exam: General: Well developed , well nourished, no acute distress examined while sitting in a wheelchair Skin: anicteric Head: Normocephalic and atraumatic Eyes:  sclerae anicteric, EOMI  Ears: Normal auditory acuity Mouth: No deformity or lesions Neck: Supple, no masses or thyromegaly Lungs: Clear throughout to auscultation Heart: Regular rate and rhythm; no murmurs, rubs or bruits Abdomen: Soft, non tender and non distended. No masses, hepatosplenomegaly or hernias noted. Normal Bowel sounds Rectal:deferred Musculoskeletal: He has hyperextension of several digits of his right hand Skin: No lesions on visible extremities Pulses:  Normal pulses noted Extremities: No clubbing, cyanosis, edema or deformities noted Neurological: Alert oriented x 4, grossly nonfocal Cervical Nodes:  No significant cervical adenopathy Inguinal Nodes: No significant inguinal adenopathy Psychological:  Alert and cooperative. Normal mood and affect

## 2012-11-18 NOTE — Assessment & Plan Note (Signed)
Patient has achalasia which has responded to Botox injections.  We again discussed therapeutic alternatives including myotomy and Botox injection.  Since she's had such excellent and prolonged responses to botox he would like to repeat this in lieu of having surgery.

## 2012-11-23 ENCOUNTER — Ambulatory Visit: Payer: Medicare Other | Admitting: Gastroenterology

## 2012-12-20 ENCOUNTER — Encounter (HOSPITAL_COMMUNITY): Payer: Self-pay | Admitting: Pharmacy Technician

## 2012-12-20 ENCOUNTER — Encounter (HOSPITAL_COMMUNITY): Payer: Self-pay | Admitting: *Deleted

## 2012-12-20 NOTE — Pre-Procedure Instructions (Signed)
Talked to [facility] of patient at [facility] and went over instructions for patient for procedure 12/21/2012.

## 2012-12-21 ENCOUNTER — Ambulatory Visit (HOSPITAL_COMMUNITY): Payer: Medicare Other | Admitting: Anesthesiology

## 2012-12-21 ENCOUNTER — Encounter (HOSPITAL_COMMUNITY): Payer: Medicare Other | Admitting: Anesthesiology

## 2012-12-21 ENCOUNTER — Encounter (HOSPITAL_COMMUNITY): Payer: Self-pay

## 2012-12-21 ENCOUNTER — Encounter (HOSPITAL_COMMUNITY)
Admission: RE | Disposition: A | Discharge status: Home / Self Care | Payer: Self-pay | Source: Ambulatory Visit | Attending: Gastroenterology

## 2012-12-21 ENCOUNTER — Ambulatory Visit (HOSPITAL_COMMUNITY)
Admission: RE | Admit: 2012-12-21 | Discharge: 2012-12-21 | Disposition: A | Discharge status: Home / Self Care | Payer: Medicare Other | Source: Ambulatory Visit | Attending: Gastroenterology | Admitting: Gastroenterology

## 2012-12-21 DIAGNOSIS — K220 Achalasia of cardia: Secondary | ICD-10-CM | POA: Insufficient documentation

## 2012-12-21 DIAGNOSIS — K219 Gastro-esophageal reflux disease without esophagitis: Secondary | ICD-10-CM | POA: Insufficient documentation

## 2012-12-21 DIAGNOSIS — R1310 Dysphagia, unspecified: Secondary | ICD-10-CM

## 2012-12-21 DIAGNOSIS — I10 Essential (primary) hypertension: Secondary | ICD-10-CM | POA: Insufficient documentation

## 2012-12-21 DIAGNOSIS — G808 Other cerebral palsy: Secondary | ICD-10-CM | POA: Insufficient documentation

## 2012-12-21 HISTORY — PX: BOTOX INJECTION: SHX5754

## 2012-12-21 HISTORY — PX: ESOPHAGOGASTRODUODENOSCOPY: SHX5428

## 2012-12-21 SURGERY — EGD (ESOPHAGOGASTRODUODENOSCOPY)
Anesthesia: Monitor Anesthesia Care

## 2012-12-21 MED ORDER — MIDAZOLAM HCL 5 MG/5ML IJ SOLN
INTRAMUSCULAR | Status: DC | PRN
  Administered 2012-12-21: 2 mg via INTRAVENOUS

## 2012-12-21 MED ORDER — ONABOTULINUMTOXINA 100 UNITS IJ SOLR
100.0000 [IU] | Freq: Once | INTRAMUSCULAR | Status: AC
  Administered 2012-12-21: 100 [IU] via INTRAMUSCULAR
  Filled 2012-12-21: qty 100

## 2012-12-21 MED ORDER — LACTATED RINGERS IV SOLN
INTRAVENOUS | Status: DC | PRN
  Administered 2012-12-21: 10:00:00 via INTRAVENOUS

## 2012-12-21 MED ORDER — SODIUM CHLORIDE 0.9 % IV SOLN: INTRAVENOUS | Status: DC

## 2012-12-21 MED ORDER — BUTAMBEN-TETRACAINE-BENZOCAINE 2-2-14 % EX AERO
INHALATION_SPRAY | CUTANEOUS | Status: DC | PRN
  Administered 2012-12-21: 2 via TOPICAL

## 2012-12-21 MED ORDER — PROPOFOL 10 MG/ML IV BOLUS
INTRAVENOUS | Status: DC | PRN
  Administered 2012-12-21: 50 mg via INTRAVENOUS

## 2012-12-21 NOTE — H&P (Signed)
History of Present Illness: 42 year old male with history of cerebral palsy, achalasia here for evaluation of dysphagia.  Last injection was in 2012.  Over the past month he's had recurrent dysphagia with choking to solids and liquids.  Pyrosis has improved since resuming omeprazole.        Past Medical History   Diagnosis  Date   .  Motility disorder, esophageal     .  Esophageal stricture     .  Hypertension     .  Pneumonia     .  GERD (gastroesophageal reflux disease)     .  CP (cerebral palsy)     .  S/P Botox injection         approx every 4 months   .  Seasonal allergies     .  Environmental allergies         takes inhalers if needed   .  Quadriplegic spinal paralysis     .  Cerebral palsy         Past Surgical History   Procedure  Laterality  Date   .  Tendon release       .  Mouth surgery       .  Esophagus surgery           stretched esophagus   .  Legs          family history includes Diabetes in his mother; Hypertension in his father; Lung cancer in his father. Current Outpatient Prescriptions   Medication  Sig  Dispense  Refill   .  Acetaminophen (TYLENOL EXTRA STRENGTH PO)  Take by mouth as needed.          Marland Kitchen  albuterol (PROAIR HFA) 108 (90 BASE) MCG/ACT inhaler  Inhale 2 puffs into the lungs every 6 (six) hours as needed. For shortness of breath         .  atenolol (TENORMIN) 25 MG tablet  Take by mouth. Takes two tablets by mouth twice daily         .  budesonide (RHINOCORT AQUA) 32 MCG/ACT nasal spray  Place 1 spray into the nose daily.         .  cyclobenzaprine (FLEXERIL) 10 MG tablet  Take 10 mg by mouth daily.         Marland Kitchen  docusate sodium (COLACE) 100 MG capsule  Take 100 mg by mouth 2 (two) times daily as needed. For constipation         .  esomeprazole (NEXIUM) 40 MG capsule  Take 40 mg by mouth daily before breakfast.         .  hydrochlorothiazide 25 MG tablet  Take 25 mg by mouth daily.           Marland Kitchen  HYDROcodone-acetaminophen (VICODIN) 5-500 MG per  tablet  Take 1 tablet by mouth every 6 (six) hours as needed for pain.         .  montelukast (SINGULAIR) 10 MG tablet  Take 10 mg by mouth at bedtime.         .  Multiple Vitamin (MULTIVITAMIN) tablet  Take 1 tablet by mouth daily.           .  polycarbophil (FIBERCON) 625 MG tablet  Take 625 mg by mouth 2 (two) times daily.           Marland Kitchen  senna (SENOKOT) 8.6 MG tablet  Take 1 tablet by mouth daily.         Marland Kitchen  St Johns Wort 450 MG CAPS  Take 1 capsule by mouth daily.             No current facility-administered medications for this visit.       Allergies as of 11/18/2012 - Review Complete 11/18/2012   Allergen  Reaction  Noted   .  Penicillins    09/16/2010       reports that he has quit smoking. He has quit using smokeless tobacco. He reports that  drinks alcohol. He reports that he does not use illicit drugs.         Review of Systems: Pertinent positive and negative review of systems were noted in the above HPI section. All other review of systems were otherwise negative.   Vital signs were reviewed in today's medical record Physical Exam: General: Well developed , well nourished, no acute distress examined while sitting in a wheelchair Skin: anicteric Head: Normocephalic and atraumatic Eyes:  sclerae anicteric, EOMI Ears: Normal auditory acuity Mouth: No deformity or lesions Neck: Supple, no masses or thyromegaly Lungs: Clear throughout to auscultation Heart: Regular rate and rhythm; no murmurs, rubs or bruits Abdomen: Soft, non tender and non distended. No masses, hepatosplenomegaly or hernias noted. Normal Bowel sounds Rectal:deferred Musculoskeletal: He has hyperextension of several digits of his right hand Skin: No lesions on visible extremities Pulses:  Normal pulses noted Extremities: No clubbing, cyanosis, edema or deformities noted Neurological: Alert oriented x 4, grossly nonfocal Cervical Nodes:  No significant cervical adenopathy Inguinal Nodes: No  significant inguinal adenopathy Psychological:  Alert and cooperative. Normal mood and affect      Impression      Patient has achalasia which has responded to Botox injections.  We again discussed therapeutic alternatives including myotomy and Botox injection.  Since she's had such excellent and prolonged responses to botox he would like to repeat this in lieu of having surgery.

## 2012-12-21 NOTE — Op Note (Addendum)
[facility] [address] [place], [address]   ENDOSCOPY PROCEDURE REPORT  PATIENT: Thomas Ramos, Thomas Ramos  MR#: [id number] BIRTHDATE: Feb 08, 1971 , 42  yrs. old GENDER: Male ENDOSCOPIST: Louis Meckel, MD REFERRED BY:  Kari Baars, M.D. PROCEDURE DATE:  12/21/2012 PROCEDURE:  EGD w/ directed submucosal injection(s), any substance (botox) ASA CLASS:     Class II INDICATIONS:  Dysphagia. MEDICATIONS: MAC sedation, administered by CRNA TOPICAL ANESTHETIC:  DESCRIPTION OF PROCEDURE: After the risks benefits and alternatives of the procedure were thoroughly explained, informed consent was obtained.  The PENTAX GASTOROSCOPE [id number] endoscope was introduced through the mouth and advanced to the third portion of the duodenum. Without limitations.  The instrument was slowly withdrawn as the mucosa was fully examined.      The upper, middle and distal third of the esophagus were carefully inspected .  There was a single diverticulum in the distal esophagus.   The endoscope was pushed into the fundus which was normal including a retroflexed view.  The antrum, gastric body, first and second part of the duodenum were unremarkable. Retroflexed views revealed no abnormalities.    At the GE junction 25 units (1 cc) Botox was injected submucosally into each quadrant. The scope was then withdrawn from the patient and the procedure completed.  COMPLICATIONS: There were no complications.  ENDOSCOPIC IMPRESSION: achalasia-status post Botox injection esophageal diverticulum  RECOMMENDATIONS: Office visit one month REPEAT EXAM:  eSigned:  Louis Meckel, MD 12/21/2012 12:27 PM Revised: 12/21/2012 12:27 PM  CC:  PATIENT NAME:  Thomas Ramos, Thomas Ramos MR#: [id number]

## 2012-12-21 NOTE — Transfer of Care (Signed)
Immediate Anesthesia Transfer of Care Note  Patient: Thomas Ramos  Procedure(s) Performed: Procedure(s): ESOPHAGOGASTRODUODENOSCOPY (EGD) (N/A) BOTOX INJECTION (N/A)  Patient Location: PACU  Anesthesia Type:MAC  Level of Consciousness: awake, sedated and patient cooperative  Airway & Oxygen Therapy: Patient Spontanous Breathing and Patient connected to nasal cannula oxygen  Post-op Assessment: Report given to PACU RN and Post -op Vital signs reviewed and stable  Post vital signs: Reviewed and stable  Complications: No apparent anesthesia complications

## 2012-12-21 NOTE — Anesthesia Preprocedure Evaluation (Signed)
Anesthesia Evaluation  Patient identified by MRN, date of birth, ID band Patient awake    Reviewed: Allergy & Precautions, H&P , NPO status , Patient's Chart, lab work & pertinent test results  Airway Mallampati: II TM Distance: <3 FB Neck ROM: Full    Dental no notable dental hx. (+) Missing   Pulmonary neg pulmonary ROS,  breath sounds clear to auscultation  Pulmonary exam normal       Cardiovascular hypertension, Pt. on medications Rhythm:Regular Rate:Normal     Neuro/Psych Quadriplegic spinal paralysis  Cerebral palsy negative neurological ROS  negative psych ROS   GI/Hepatic Neg liver ROS, GERD-  Medicated,  Endo/Other  negative endocrine ROS  Renal/GU negative Renal ROS  negative genitourinary   Musculoskeletal negative musculoskeletal ROS (+)   Abdominal   Peds negative pediatric ROS (+)  Hematology negative hematology ROS (+)   Anesthesia Other Findings   Reproductive/Obstetrics negative OB ROS                           Anesthesia Physical Anesthesia Plan  ASA: III  Anesthesia Plan: MAC   Post-op Pain Management:    Induction: Intravenous  Airway Management Planned: Nasal Cannula  Additional Equipment:   Intra-op Plan:   Post-operative Plan:   Informed Consent: I have reviewed the patients History and Physical, chart, labs and discussed the procedure including the risks, benefits and alternatives for the proposed anesthesia with the patient or authorized representative who has indicated his/her understanding and acceptance.   Dental advisory given  Plan Discussed with: CRNA and Surgeon  Anesthesia Plan Comments:         Anesthesia Quick Evaluation

## 2012-12-21 NOTE — Anesthesia Postprocedure Evaluation (Signed)
  Anesthesia Post-op Note  Patient: Thomas Ramos  Procedure(s) Performed: Procedure(s) (LRB): ESOPHAGOGASTRODUODENOSCOPY (EGD) (N/A) BOTOX INJECTION (N/A)  Patient Location: PACU  Anesthesia Type: MAC  Level of Consciousness: awake and alert   Airway and Oxygen Therapy: Patient Spontanous Breathing  Post-op Pain: mild  Post-op Assessment: Post-op Vital signs reviewed, Patient's Cardiovascular Status Stable, Respiratory Function Stable, Patent Airway and No signs of Nausea or vomiting  Last Vitals:  Filed Vitals:   12/21/12 1107  BP:   Pulse: 79  Temp: 36.8 C  Resp: 16    Post-op Vital Signs: stable   Complications: No apparent anesthesia complications

## 2012-12-22 ENCOUNTER — Encounter (HOSPITAL_COMMUNITY): Payer: Self-pay | Admitting: Gastroenterology

## 2013-01-30 ENCOUNTER — Encounter: Payer: Self-pay | Admitting: Gastroenterology

## 2013-01-30 ENCOUNTER — Ambulatory Visit (INDEPENDENT_AMBULATORY_CARE_PROVIDER_SITE_OTHER): Payer: Medicare Other | Admitting: Gastroenterology

## 2013-01-30 VITALS — BP 102/78 | HR 72

## 2013-01-30 DIAGNOSIS — R1310 Dysphagia, unspecified: Secondary | ICD-10-CM

## 2013-01-30 NOTE — Progress Notes (Signed)
History of Present Illness: The patient has returned following Botox injection for achalasia.  Since the procedure approximately one month ago he has done well.  Dysphagia is significantly improved.  On only one occasion has he had difficulty swallowing food.    Past Medical History  Diagnosis Date  . Motility disorder, esophageal   . Esophageal stricture   . Hypertension   . Pneumonia   . GERD (gastroesophageal reflux disease)   . CP (cerebral palsy)   . S/P Botox injection     approx every 4 months  . Seasonal allergies   . Environmental allergies     takes inhalers if needed  . Quadriplegic spinal paralysis   . Cerebral palsy    Past Surgical History  Procedure Laterality Date  . Tendon release    . Mouth surgery    . Esophagus surgery      stretched esophagus  . Legs    . Esophagogastroduodenoscopy N/A 12/21/2012    Procedure: ESOPHAGOGASTRODUODENOSCOPY (EGD);  Surgeon: Louis Meckel, MD;  Location: [facility];  Service: Endoscopy;  Laterality: N/A;  . Botox injection N/A 12/21/2012    Procedure: BOTOX INJECTION;  Surgeon: Louis Meckel, MD;  Location: [facility];  Service: Endoscopy;  Laterality: N/A;   family history includes Diabetes in his mother; Hypertension in his father; Lung cancer in his father. Current Outpatient Prescriptions  Medication Sig Dispense Refill  . amLODipine (NORVASC) 5 MG tablet Take 5 mg by mouth every evening.      . citalopram (CELEXA) 20 MG tablet Take 20 mg by mouth every evening.      . ezetimibe (ZETIA) 10 MG tablet Take 10 mg by mouth every evening.      Marland Kitchen guaiFENesin (MUCINEX) 600 MG 12 hr tablet Take 600 mg by mouth 2 (two) times daily.      . hydrochlorothiazide 25 MG tablet Take 25 mg by mouth every evening.       . meloxicam (MOBIC) 7.5 MG tablet Take 7.5 mg by mouth daily.      . Multiple Vitamin (MULTIVITAMIN WITH MINERALS) TABS tablet Take 1 tablet by mouth daily.      Marland Kitchen omeprazole (PRILOSEC) 20 MG capsule Take 20 mg by  mouth daily.      . propranolol (INDERAL) 40 MG tablet Take 40 mg by mouth 3 (three) times daily.      . psyllium (REGULOID) 0.52 G capsule Take 0.52 g by mouth daily.       No current facility-administered medications for this visit.   Allergies as of 01/30/2013 - Review Complete 01/30/2013  Allergen Reaction Noted  . Penicillins Hives 09/16/2010    reports that he has quit smoking. He has quit using smokeless tobacco. He reports that he drinks alcohol. He reports that he does not use illicit drugs.     Review of Systems: Pertinent positive and negative review of systems were noted in the above HPI section. All other review of systems were otherwise negative.  Vital signs were reviewed in today's medical record Physical Exam: General: The patient was observed sitting in a wheelchair; he is in no acute distress He has contractile deformities over his upper and lower extremities

## 2013-01-30 NOTE — Assessment & Plan Note (Addendum)
The patient appears to have achalasia by endoscopic exam and barium swallow.  No formal manometry has yet been done.  He has had a good and prolonged response to Botox.  We again discussed therapies for achalasia including surgical myotomy or balloon dilatation.  He feels that he'd like to defer any invasive procedure provided that  response to injections remain long lasting.  Should he lose response to Botox, or if any other therapies are contemplated, he will require formal esophageal manometry testing.

## 2013-01-30 NOTE — Patient Instructions (Signed)
Follow up as needed

## 2013-02-22 ENCOUNTER — Ambulatory Visit (INDEPENDENT_AMBULATORY_CARE_PROVIDER_SITE_OTHER): Payer: Medicare Other | Admitting: Neurology

## 2013-02-22 ENCOUNTER — Encounter: Payer: Self-pay | Admitting: Neurology

## 2013-02-22 DIAGNOSIS — G809 Cerebral palsy, unspecified: Secondary | ICD-10-CM

## 2013-02-22 DIAGNOSIS — G8250 Quadriplegia, unspecified: Secondary | ICD-10-CM

## 2013-02-22 DIAGNOSIS — R1310 Dysphagia, unspecified: Secondary | ICD-10-CM

## 2013-02-22 MED ORDER — ONABOTULINUMTOXINA 100 UNITS IJ SOLR
300.0000 [IU] | Freq: Once | INTRAMUSCULAR | Status: AC
  Administered 2013-02-22: 300 [IU] via INTRAMUSCULAR

## 2013-02-22 NOTE — Progress Notes (Signed)
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 He was able to feed himself with his left hand , able to go to bathroom with assistance , but he can not transfer himself in and out of wheelchair.    Since 2010, he was under the care of cerebral palsy specialist Dr. Dan Humphreys, at [facility], for EMG guided Botox injection for the right upper extremity spasticity, he reported moderate improvement, he has less spastic right upper extremity pain after the injection. Last injection was in March 2012. He is not ambulatory, denies significant pain in bilateral lower extremity, with fixed contraction. He transferred his injection to our office for convenience.  He recently moved to a different living facility,  He is also taking baclofen 10 mg one in the morning, 2 at nighttime, complains of drowsiness with baclofen.  UPDATE 02/22/2013:  Last injection was in March 2014,he was able to relax his right arm, less pain, there was no significant side effect,    Physical Exam  Neck: supple no carotid bruits Respiratory: clear to auscultation bilaterally Cardiovascular: regular rate rhythm  Neurologic Exam  Mental Status:  wheelchair bound, spastic slow talking, following commands Cranial Nerves: CN II-XII pupils were equal round reactive to light.  spontaneous nystagmus, conjugate eye movements, difficulty to hold right gaze,  he was able to read large print.   Facial sensation and strength were normal.    Uvula tongue were midline.  Head turning and shoulder shrugging were spastic and symmetric. difficulty with tongue protrusion. Motor:  spastic quadriplegia, relative free movement in left arm, contracted right upper extremity and  bilateral lower extremity,  right pectoralis muscle hypertrophy, tender, right arm stayed at right elbow flexion, fixed  at 160, elbow pronation, forceful wrist flexion, finger flexion at metacarpophalangeal joints, thumb in position,extended finger at proximal and distal interphalangeal joints. fixed  wrist flexion, maximum 90 degree, fixed bilateral knee flexion. He has mild left hip flexion 2/5, right hip flexion 0 Sensory:  not reliable Coordination:  There was no dysmetria noticed. 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 (Lot [id number], exp April 2017), 100 units of BOTOX was dissolved into 2 cc of NS.  Right biceps 50 Right brachialis 50 Right  flexor carpi ulnaris  50 units Right flexor carpi radialis  50 Right pronator teres 50units Right flexor digitorum profundus 50 units  He tolerated the injection well, will return in 3 months for repeat injections.

## 2013-02-23 ENCOUNTER — Telehealth: Payer: Self-pay | Admitting: Neurology

## 2013-02-23 NOTE — Telephone Encounter (Signed)
Left message and relayed Dr. Zannie Cove message.  If worsening condition was told to call us back.

## 2013-02-23 NOTE — Telephone Encounter (Signed)
Left message for patient about Botox reaction.  Told to call if his condition worsens.

## 2013-02-23 NOTE — Telephone Encounter (Signed)
Patient called stating that he had a question about some type of results concerning his visit. Patient was hard to understand. Please call.

## 2013-02-23 NOTE — Telephone Encounter (Signed)
The patient's PA, Brandon Melnick called and had the patient in the office today.  He was complaining of soreness where he received the Botox injection.  She said he had no swelling or irritation at the injection sites and didn't think this was an unusual reaction.  I told her I would check with the doctor.  Please advise.

## 2013-02-23 NOTE — Telephone Encounter (Signed)
Please call patient, it is expected, continue to observe patient, call back for worsening  Complains, edema, hematoma.

## 2013-02-24 ENCOUNTER — Telehealth: Payer: Self-pay | Admitting: Neurology

## 2013-02-24 NOTE — Telephone Encounter (Signed)
Please advise 

## 2013-02-24 NOTE — Telephone Encounter (Signed)
Patient said doesn't want to use the new machine. New machine feels like the voltage was going through patients body. The old one it doesn't do that with.   Patient is a little sore and achy now. Didn't have the problem with the old one. Would like you to use the old one in the future. Does not want to go through this again. Patient did go to his primary doctor yesterday because of pain and discomfort. Dr. Jannifer Hick concerned. She didn't see any marks. But, patient states, it's his body and it wasn't comfortable.

## 2013-02-24 NOTE — Telephone Encounter (Signed)
I have called his superviser, he complained of discomfort following his BOTOX injection in 12/10, now he is feeling much better.  I tried to call him at [phone].  He has severe dysarthria, it was difficulty to understand him sometimes,  he complained of right arm achy pain, now he is much better.

## 2013-02-28 ENCOUNTER — Telehealth: Payer: Self-pay | Admitting: *Deleted

## 2013-02-28 DIAGNOSIS — R1310 Dysphagia, unspecified: Secondary | ICD-10-CM

## 2013-02-28 DIAGNOSIS — G809 Cerebral palsy, unspecified: Secondary | ICD-10-CM

## 2013-02-28 NOTE — Telephone Encounter (Signed)
Please let patient know that I have ordered home OT

## 2013-02-28 NOTE — Telephone Encounter (Signed)
Dr.Yan's order has been placed and in chart for Home OT

## 2013-04-03 ENCOUNTER — Encounter: Payer: Self-pay | Admitting: Family Medicine

## 2013-04-16 ENCOUNTER — Encounter: Payer: Self-pay | Admitting: Family Medicine

## 2013-05-23 ENCOUNTER — Ambulatory Visit: Payer: Medicare Other | Admitting: Neurology

## 2013-06-28 ENCOUNTER — Telehealth: Payer: Self-pay | Admitting: Neurology

## 2013-06-28 NOTE — Telephone Encounter (Signed)
Message copied by Marcial Pacas on Wed Jun 28, 2013 10:56 AM ------      Message from: Danford Bad      Created: Tue Jun 13, 2013  8:32 AM       I called this patient because it looks like he has appointments being made by everyone and is now set for an appointment in July and his last injection was in December. He did not want to set up an appointment stating he wants to know if you will be using the "new" machine again because he did not like it and only wants more injections if you are going to use the "old" machine again. Please advise so that I can adjust his appointment. ------

## 2013-06-28 NOTE — Telephone Encounter (Addendum)
Janisha:  Please call patient, he needs to come every 3 month for BOTOX injection if he thinks that previous injection was helpful. I only used Botox, is that correct? Dr. Krista Blue  Message copied by Marcial Pacas on Wed Jun 28, 2013 10:58 AM ------      Message from: Danford Bad      Created: Tue Jun 13, 2013  8:32 AM       I called this patient because it looks like he has appointments being made by everyone and is now set for an appointment in July and his last injection was in December. He did not want to set up an appointment stating he wants to know if you will be using the "new" machine again because he did not like it and only wants more injections if you are going to use the "old" machine again. Please advise so that I can adjust his appointment. ------

## 2013-07-04 ENCOUNTER — Ambulatory Visit: Payer: Self-pay | Admitting: Neurology

## 2013-07-19 ENCOUNTER — Emergency Department (HOSPITAL_COMMUNITY): Payer: Medicare Other

## 2013-07-19 ENCOUNTER — Emergency Department (INDEPENDENT_AMBULATORY_CARE_PROVIDER_SITE_OTHER)
Admission: EM | Admit: 2013-07-19 | Discharge: 2013-07-19 | Disposition: A | Discharge status: Nursing Home (Includes ICF) | Payer: Medicare Other | Source: Home / Self Care | Attending: Family Medicine | Admitting: Family Medicine

## 2013-07-19 ENCOUNTER — Encounter (HOSPITAL_COMMUNITY): Payer: Self-pay | Admitting: Emergency Medicine

## 2013-07-19 ENCOUNTER — Emergency Department (HOSPITAL_COMMUNITY)
Admission: EM | Admit: 2013-07-19 | Discharge: 2013-07-19 | Disposition: A | Discharge status: Home / Self Care | Payer: Medicare Other | Attending: Emergency Medicine | Admitting: Emergency Medicine

## 2013-07-19 DIAGNOSIS — Z880 Allergy status to penicillin: Secondary | ICD-10-CM | POA: Insufficient documentation

## 2013-07-19 DIAGNOSIS — R079 Chest pain, unspecified: Secondary | ICD-10-CM

## 2013-07-19 DIAGNOSIS — Z8701 Personal history of pneumonia (recurrent): Secondary | ICD-10-CM | POA: Insufficient documentation

## 2013-07-19 DIAGNOSIS — S50311A Abrasion of right elbow, initial encounter: Secondary | ICD-10-CM

## 2013-07-19 DIAGNOSIS — Y939 Activity, unspecified: Secondary | ICD-10-CM | POA: Insufficient documentation

## 2013-07-19 DIAGNOSIS — S2231XA Fracture of one rib, right side, initial encounter for closed fracture: Secondary | ICD-10-CM

## 2013-07-19 DIAGNOSIS — R0781 Pleurodynia: Secondary | ICD-10-CM

## 2013-07-19 DIAGNOSIS — I10 Essential (primary) hypertension: Secondary | ICD-10-CM | POA: Insufficient documentation

## 2013-07-19 DIAGNOSIS — Z79899 Other long term (current) drug therapy: Secondary | ICD-10-CM | POA: Insufficient documentation

## 2013-07-19 DIAGNOSIS — K219 Gastro-esophageal reflux disease without esophagitis: Secondary | ICD-10-CM | POA: Insufficient documentation

## 2013-07-19 DIAGNOSIS — IMO0002 Reserved for concepts with insufficient information to code with codable children: Secondary | ICD-10-CM | POA: Insufficient documentation

## 2013-07-19 DIAGNOSIS — Z791 Long term (current) use of non-steroidal anti-inflammatories (NSAID): Secondary | ICD-10-CM | POA: Insufficient documentation

## 2013-07-19 DIAGNOSIS — Y929 Unspecified place or not applicable: Secondary | ICD-10-CM | POA: Insufficient documentation

## 2013-07-19 DIAGNOSIS — Z8669 Personal history of other diseases of the nervous system and sense organs: Secondary | ICD-10-CM | POA: Insufficient documentation

## 2013-07-19 DIAGNOSIS — W050XXA Fall from non-moving wheelchair, initial encounter: Secondary | ICD-10-CM | POA: Insufficient documentation

## 2013-07-19 DIAGNOSIS — Z87891 Personal history of nicotine dependence: Secondary | ICD-10-CM | POA: Insufficient documentation

## 2013-07-19 DIAGNOSIS — S2239XA Fracture of one rib, unspecified side, initial encounter for closed fracture: Secondary | ICD-10-CM | POA: Insufficient documentation

## 2013-07-19 MED ORDER — IBUPROFEN 200 MG PO TABS
600.0000 mg | ORAL_TABLET | Freq: Once | ORAL | Status: AC
Start: 2013-07-19 — End: 2013-07-19
  Administered 2013-07-19: 600 mg via ORAL
  Filled 2013-07-19 (×2): qty 1

## 2013-07-19 MED ORDER — OXYCODONE-ACETAMINOPHEN 5-325 MG PO TABS: ORAL_TABLET | ORAL | Status: DC

## 2013-07-19 MED ORDER — OXYCODONE-ACETAMINOPHEN 5-325 MG PO TABS
2.0000 | ORAL_TABLET | Freq: Once | ORAL | Status: AC
  Administered 2013-07-19: 2 via ORAL
  Filled 2013-07-19: qty 2

## 2013-07-19 NOTE — ED Notes (Signed)
2 caregivers with patient, patient wheelchair bound, sustained a fall today.  Abrasion to right elbow, pain right ribcage-no visible mark on torso.

## 2013-07-19 NOTE — ED Notes (Signed)
Pt sent down from urgent care for xrays, told by urgent care RN that they were unable to complete xrays due to patient muscle problems

## 2013-07-19 NOTE — ED Notes (Signed)
Pt sent here for further evaluation of fall from wheelchair this am. Pt having right rib pain.

## 2013-07-19 NOTE — ED Provider Notes (Signed)
Thomas Ramos is a 43 y.o. male who presents to Urgent Care today for rib pain. Patient fell from his wheelchair today laying on his right side. He has right lateral elbow pain as well as rib pain. The pain is worse with deep inspiration. He denies any significant new shortness of breath. He denies any nausea vomiting or diarrhea. He denies hitting his head headache dizziness lightheadedness. He denies any loss of consciousness.  History significant for cerebral palsy and quadriplegia.   Past Medical History  Diagnosis Date  . Motility disorder, esophageal   . Esophageal stricture   . Hypertension   . Pneumonia   . GERD (gastroesophageal reflux disease)   . CP (cerebral palsy)   . S/P Botox injection     approx every 4 months  . Seasonal allergies   . Environmental allergies     takes inhalers if needed  . Quadriplegic spinal paralysis   . Cerebral palsy    History  Substance Use Topics  . Smoking status: Former Research scientist (life sciences)  . Smokeless tobacco: Former Systems developer     Comment: smoked one time  . Alcohol Use: 0.0 oz/week     Comment: rare   ROS as above Medications: No current facility-administered medications for this encounter.   Current Outpatient Prescriptions  Medication Sig Dispense Refill  . amLODipine (NORVASC) 5 MG tablet Take 5 mg by mouth every evening.      . citalopram (CELEXA) 20 MG tablet Take 20 mg by mouth every evening.      . ezetimibe (ZETIA) 10 MG tablet Take 10 mg by mouth every evening.      Marland Kitchen guaiFENesin (MUCINEX) 600 MG 12 hr tablet Take 600 mg by mouth 2 (two) times daily.      . hydrochlorothiazide 25 MG tablet Take 25 mg by mouth every evening.       . meloxicam (MOBIC) 7.5 MG tablet Take 7.5 mg by mouth daily.      . Multiple Vitamin (MULTIVITAMIN WITH MINERALS) TABS tablet Take 1 tablet by mouth daily.      Marland Kitchen omeprazole (PRILOSEC) 20 MG capsule Take 20 mg by mouth daily.      Marland Kitchen PROAIR HFA 108 (90 BASE) MCG/ACT inhaler       . propranolol (INDERAL) 40 MG  tablet Take 40 mg by mouth 3 (three) times daily.      . psyllium (REGULOID) 0.52 G capsule Take 0.52 g by mouth daily.        Exam:  BP 154/92  Pulse 94  Temp(Src) 98 F (36.7 C) (Oral)  Resp 18  SpO2 100% Gen: Well NAD HEENT: EOMI,  MMM Lungs: Normal work of breathing. Breath sounds present bilaterally. Some expiratory wheezing present in the upper airway but not heard in the lungs.  Heart: RRR no MRG Abd: NABS, Soft. NT, ND Exts: Abrasion right forearm. Spasticity bilateral upper and lower extremities  No results found for this or any previous visit (from the past 24 hour(s)). No results found.  Assessment and Plan: 43 y.o. male with rib injury and elbow injury secondary to fall with the setting of pre-existing cerebral palsy and spastic quadriparesis. Were technically unable to perform satisfactory x-rays at the urgent care with our limited x-ray room given the nature of his injury and pre-existing condition.  Patient has good air movement bilaterally. Doubtful for pneumothorax. Plan to transfer to the emergency room for further evaluation and management  Discussed warning signs or symptoms. Please see discharge instructions. Patient  expresses understanding.    Gregor Hams, MD 07/19/13 1059

## 2013-07-19 NOTE — ED Provider Notes (Signed)
CSN: [id number]     Arrival date & time 07/19/13  1134 History   First MD Initiated Contact with Patient 07/19/13 1429    This chart was scribed for Noland Fordyce PA-C, a non-physician practitioner working with Alfonzo Feller, DO by Denice Bors, ED Scribe. This patient was seen in room TR09C/TR09C and the patient's care was started at 2:44 PM    Chief Complaint  Patient presents with  . Fall     (Consider location/radiation/quality/duration/timing/severity/associated sxs/prior Treatment) The history is provided by the patient and a caregiver. No language interpreter was used.  Level 5 caveat applies due to CP.   HPI Comments: Thomas Ramos is a 43 y.o. male who presents to the Emergency Department with PMHx of CP complaining of fall onto right side from a wheel chair onset this morning as staff members were moving him in a manual wheelchair. Pt normally has an IT trainer wheelchair. Reports associated right rib pain and right elbow pain. Pain is exacerbated by touch and deep breathing. Denies taking any medications PTA. Denies associated head injury, and LOC.  Pt is at baseline per caregiver.  Past Medical History  Diagnosis Date  . Motility disorder, esophageal   . Esophageal stricture   . Hypertension   . Pneumonia   . GERD (gastroesophageal reflux disease)   . CP (cerebral palsy)   . S/P Botox injection     approx every 4 months  . Seasonal allergies   . Environmental allergies     takes inhalers if needed  . Quadriplegic spinal paralysis   . Cerebral palsy    Past Surgical History  Procedure Laterality Date  . Tendon release    . Mouth surgery    . Esophagus surgery      stretched esophagus  . Legs    . Esophagogastroduodenoscopy N/A 12/21/2012    Procedure: ESOPHAGOGASTRODUODENOSCOPY (EGD);  Surgeon: Inda Castle, MD;  Location: [facility];  Service: Endoscopy;  Laterality: N/A;  . Botox injection N/A 12/21/2012    Procedure: BOTOX INJECTION;  Surgeon: Inda Castle, MD;  Location: [facility];  Service: Endoscopy;  Laterality: N/A;   Family History  Problem Relation Age of Onset  . Hypertension Father   . Lung cancer Father   . Diabetes Mother    History  Substance Use Topics  . Smoking status: Former Research scientist (life sciences)  . Smokeless tobacco: Former Systems developer     Comment: smoked one time  . Alcohol Use: 0.0 oz/week     Comment: rare    Review of Systems  Unable to perform ROS: Other  Constitutional: Negative for fever.  Musculoskeletal: Positive for myalgias.  Psychiatric/Behavioral: Negative for confusion.      Allergies  Penicillins  Home Medications   Prior to Admission medications   Medication Sig Start Date End Date Taking? Authorizing Provider  amLODipine (NORVASC) 5 MG tablet Take 5 mg by mouth every evening.    Historical Provider, MD  citalopram (CELEXA) 20 MG tablet Take 20 mg by mouth every evening.    Historical Provider, MD  ezetimibe (ZETIA) 10 MG tablet Take 10 mg by mouth every evening.    Historical Provider, MD  guaiFENesin (MUCINEX) 600 MG 12 hr tablet Take 600 mg by mouth 2 (two) times daily.    Historical Provider, MD  hydrochlorothiazide 25 MG tablet Take 25 mg by mouth every evening.     Historical Provider, MD  meloxicam (MOBIC) 7.5 MG tablet Take 7.5 mg by mouth daily.  Historical Provider, MD  Multiple Vitamin (MULTIVITAMIN WITH MINERALS) TABS tablet Take 1 tablet by mouth daily.    Historical Provider, MD  omeprazole (PRILOSEC) 20 MG capsule Take 20 mg by mouth daily.    Historical Provider, MD  PROAIR HFA 108 (90 BASE) MCG/ACT inhaler  12/29/12   Historical Provider, MD  propranolol (INDERAL) 40 MG tablet Take 40 mg by mouth 3 (three) times daily.    Historical Provider, MD  psyllium (REGULOID) 0.52 G capsule Take 0.52 g by mouth daily.    Historical Provider, MD   BP 118/94  Pulse 103  Temp(Src) 98.4 F (36.9 C)  Resp 18  SpO2 96% Physical Exam  Nursing note and vitals reviewed. Constitutional: He is  oriented to person, place, and time. He appears well-developed and well-nourished. No distress.  HENT:  Head: Normocephalic and atraumatic.  Eyes: EOM are normal. Pupils are equal, round, and reactive to light.  Neck: Normal range of motion. Neck supple. No tracheal deviation present.  No midline bone tenderness, no crepitus or step-offs.   Cardiovascular: Normal rate.   Pulmonary/Chest: Effort normal and breath sounds normal. No respiratory distress. He has no wheezes. He has no rales. He exhibits tenderness.  No respiratory distress.  Lungs: CTAB.  Tenderness to palpation over right ribs 9-11. No flail chest or crepitus.    Musculoskeletal: Normal range of motion.  Muscle contractures of both arms and hands (chronic).  Tenderness to lateral aspect of right elbow.   Neurological: He is alert and oriented to person, place, and time.  Skin: Skin is warm and dry.  Abrasion to right elbow.  Psychiatric: He has a normal mood and affect. His behavior is normal.    ED Course  Procedures (including critical care time) COORDINATION OF CARE:  Nursing notes reviewed. Vital signs reviewed. Initial pt interview and examination performed.   Filed Vitals:   07/19/13 1156  BP: 118/94  Pulse: 103  Temp: 98.4 F (36.9 C)  Resp: 18  SpO2: 96%    4:08 PM-Discussed work up plan with pt at bedside, which includes  Orders Placed This Encounter  Procedures  . DG Ribs Unilateral W/Chest Right    Standing Status: Standing     Number of Occurrences: 1     Standing Expiration Date:     Order Specific Question:  Reason for exam:    Answer:  FALL  . DG Elbow Complete Right    Standing Status: Standing     Number of Occurrences: 1     Standing Expiration Date:     Order Specific Question:  Reason for exam:    Answer:  FALL  . Incentive spirometry RT    Standing Status: Standing     Number of Occurrences: 1     Standing Expiration Date:   . Pt agrees with plan.   Treatment plan  initiated: Medications  oxyCODONE-acetaminophen (PERCOCET/ROXICET) 5-325 MG per tablet 2 tablet (2 tablets Oral Given 07/19/13 1502)  ibuprofen (ADVIL,MOTRIN) tablet 600 mg (600 mg Oral Given 07/19/13 1502)     Initial diagnostic testing ordered.      Labs Review Labs Reviewed - No data to display  Imaging Review Dg Ribs Unilateral W/chest Right  07/19/2013   CLINICAL DATA:  Status post fall.  Lateral right chest pain.  EXAM: RIGHT RIBS AND CHEST - 3+ VIEW  COMPARISON:  PA and lateral chest 11/19/2011.  FINDINGS: Mild elevation of the right hemidiaphragm relative to the left is identified. Lungs appear clear.  No pneumothorax or pleural effusion. Heart size is normal. There is a fracture of the posterior arc of the right eleventh rib. No other fracture is identified.  IMPRESSION: Right eleventh rib fracture.  No other acute abnormality.   Electronically Signed   By: Inge Rise M.D.   On: 07/19/2013 12:51   2:49 PM Nursing Notes Reviewed/ Care Coordinated Applicable Imaging Reviewed and incorporated into ED treatment Discussed results and treatment plan with pt. Pt demonstrates understanding and agrees with plan.   EKG Interpretation None      MDM   Final diagnoses:  Fall from wheelchair  Right rib fracture  Abrasion of right elbow    Pt brought to ED by care giver after fall from wheelchair. No loss of consciousness.  No respiratory distress however complaining of right rib pain plain films consistent with 11th rib fracture no pneumothorax or pleural effusion.  Will discharge patient home with incentive spirometry and pain medication. Advise followup with PCP. Return precautions provided. Pt and caregiver verbalized understanding and agreement with tx plan.   I personally performed the services described in this documentation, which was scribed in my presence. The recorded information has been reviewed and is accurate.    Noland Fordyce, PA-C 07/19/13 [phone]

## 2013-07-19 NOTE — ED Notes (Signed)
The pt reports that his pain is better

## 2013-07-21 NOTE — ED Provider Notes (Signed)
Medical screening examination/treatment/procedure(s) were performed by non-physician practitioner and as supervising physician I was immediately available for consultation/collaboration.   EKG Interpretation None        Alfonzo Feller, DO 07/21/13 2111

## 2013-07-27 ENCOUNTER — Ambulatory Visit: Payer: Medicare Other | Attending: Family Medicine | Admitting: Physical Therapy

## 2013-07-27 DIAGNOSIS — M6281 Muscle weakness (generalized): Secondary | ICD-10-CM | POA: Diagnosis not present

## 2013-07-27 DIAGNOSIS — IMO0001 Reserved for inherently not codable concepts without codable children: Secondary | ICD-10-CM | POA: Diagnosis present

## 2013-07-27 DIAGNOSIS — G809 Cerebral palsy, unspecified: Secondary | ICD-10-CM | POA: Insufficient documentation

## 2013-08-09 ENCOUNTER — Encounter: Payer: Self-pay | Admitting: Neurology

## 2013-08-09 ENCOUNTER — Ambulatory Visit (INDEPENDENT_AMBULATORY_CARE_PROVIDER_SITE_OTHER): Payer: Medicare Other | Admitting: Neurology

## 2013-08-09 VITALS — BP 138/94 | HR 76

## 2013-08-09 DIAGNOSIS — G8250 Quadriplegia, unspecified: Secondary | ICD-10-CM

## 2013-08-09 DIAGNOSIS — R1310 Dysphagia, unspecified: Secondary | ICD-10-CM

## 2013-08-09 DIAGNOSIS — G809 Cerebral palsy, unspecified: Secondary | ICD-10-CM

## 2013-08-09 MED ORDER — ONABOTULINUMTOXINA 100 UNITS IJ SOLR
300.0000 [IU] | Freq: Once | INTRAMUSCULAR | Status: AC
  Administered 2013-08-09: 300 [IU] via INTRAMUSCULAR

## 2013-08-09 NOTE — Progress Notes (Signed)
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    Since 2010, he was under the care of cerebral palsy specialist Dr. Gilford Rile, at [facility], for EMG guided Botox injection for the right upper extremity spasticity, he reported moderate improvement, he has less spastic right upper extremity pain after the injection. Last injection was in March 2012. He is not ambulatory, denies significant pain in bilateral lower extremity, with fixed contraction. He transferred his injection to our office for convenience.  He recently moved to a different living facility,  He is also taking baclofen 10 mg one in the morning, 2 at nighttime, complains of drowsiness with baclofen.  UPDATE 02/22/2013:  Last injection was in March 2014,he was able to relax his right arm, less pain, there was no significant side effect,    Physical Exam  Neck: supple no carotid bruits Respiratory: clear to auscultation bilaterally Cardiovascular: regular rate rhythm  Neurologic Exam  Mental Status:  wheelchair bound, spastic slow talking, following commands Cranial Nerves: CN II-XII pupils were equal round reactive to light.  spontaneous nystagmus, conjugate eye movements, difficulty to hold right gaze,  he was able to read large print.   Facial sensation and strength were normal.    Uvula tongue were midline.  Head turning and shoulder shrugging were spastic and symmetric. difficulty with tongue protrusion. Motor:  spastic quadriplegia, relative free movement in left arm, contracted right upper extremity and  bilateral lower extremity,  right pectoralis muscle hypertrophy, tender, right arm stayed at right elbow flexion, fixed  at 160, elbow pronation, forceful wrist flexion, finger flexion at metacarpophalangeal joints, thumb in position,extended finger at proximal and distal interphalangeal joints. fixed  wrist flexion, maximum 90 degree, fixed bilateral knee flexion. He has mild left hip flexion 2/5, right hip flexion 0 Sensory:  not reliable Coordination:  There was no dysmetria noticed. Gait and Station:  deferred Reflexes:  hyperactive at bilateral upper extremities  Assessment Plan: 43 years old left-handed Caucasian male, with cerebral palsy, wheelchair-bound all his life,  spastic quadriplegia, now with right arm and chest spastic pain, responded moderately to previous Botox injection, desires continued injections at our office,  Under EMG guidance, 300 units of BOTOX A were injected.  100 units of BOTOX/2 cc of NS.  Right biceps 50 Right brachialis 50   Right  flexor carpi ulnaris  50 units Right flexor digitorum profundus 50 units  Right pectoralis major 50 units Right pronator teres 50 units  He tolerated the injection well, will return in 3 months for repeat injections.

## 2013-10-06 ENCOUNTER — Ambulatory Visit: Payer: Self-pay | Admitting: Neurology

## 2013-10-15 ENCOUNTER — Encounter (HOSPITAL_COMMUNITY): Payer: Self-pay | Admitting: Emergency Medicine

## 2013-10-15 ENCOUNTER — Emergency Department (HOSPITAL_COMMUNITY)
Admission: EM | Admit: 2013-10-15 | Discharge: 2013-10-15 | Disposition: A | Discharge status: Home / Self Care | Payer: Medicare Other | Attending: Emergency Medicine | Admitting: Emergency Medicine

## 2013-10-15 DIAGNOSIS — Z880 Allergy status to penicillin: Secondary | ICD-10-CM | POA: Diagnosis not present

## 2013-10-15 DIAGNOSIS — S0003XA Contusion of scalp, initial encounter: Secondary | ICD-10-CM | POA: Insufficient documentation

## 2013-10-15 DIAGNOSIS — W1809XA Striking against other object with subsequent fall, initial encounter: Secondary | ICD-10-CM | POA: Insufficient documentation

## 2013-10-15 DIAGNOSIS — I10 Essential (primary) hypertension: Secondary | ICD-10-CM | POA: Diagnosis not present

## 2013-10-15 DIAGNOSIS — Z8701 Personal history of pneumonia (recurrent): Secondary | ICD-10-CM | POA: Insufficient documentation

## 2013-10-15 DIAGNOSIS — Y9389 Activity, other specified: Secondary | ICD-10-CM | POA: Insufficient documentation

## 2013-10-15 DIAGNOSIS — Z791 Long term (current) use of non-steroidal anti-inflammatories (NSAID): Secondary | ICD-10-CM | POA: Diagnosis not present

## 2013-10-15 DIAGNOSIS — S0993XA Unspecified injury of face, initial encounter: Secondary | ICD-10-CM | POA: Insufficient documentation

## 2013-10-15 DIAGNOSIS — S199XXA Unspecified injury of neck, initial encounter: Secondary | ICD-10-CM

## 2013-10-15 DIAGNOSIS — S1093XA Contusion of unspecified part of neck, initial encounter: Principal | ICD-10-CM

## 2013-10-15 DIAGNOSIS — S0081XA Abrasion of other part of head, initial encounter: Secondary | ICD-10-CM

## 2013-10-15 DIAGNOSIS — S0083XA Contusion of other part of head, initial encounter: Principal | ICD-10-CM | POA: Insufficient documentation

## 2013-10-15 DIAGNOSIS — IMO0002 Reserved for concepts with insufficient information to code with codable children: Secondary | ICD-10-CM | POA: Insufficient documentation

## 2013-10-15 DIAGNOSIS — K219 Gastro-esophageal reflux disease without esophagitis: Secondary | ICD-10-CM | POA: Diagnosis not present

## 2013-10-15 DIAGNOSIS — W19XXXA Unspecified fall, initial encounter: Secondary | ICD-10-CM

## 2013-10-15 DIAGNOSIS — Z79899 Other long term (current) drug therapy: Secondary | ICD-10-CM | POA: Insufficient documentation

## 2013-10-15 DIAGNOSIS — Y9289 Other specified places as the place of occurrence of the external cause: Secondary | ICD-10-CM | POA: Insufficient documentation

## 2013-10-15 DIAGNOSIS — W010XXA Fall on same level from slipping, tripping and stumbling without subsequent striking against object, initial encounter: Secondary | ICD-10-CM | POA: Insufficient documentation

## 2013-10-15 DIAGNOSIS — Z8669 Personal history of other diseases of the nervous system and sense organs: Secondary | ICD-10-CM | POA: Insufficient documentation

## 2013-10-15 DIAGNOSIS — S0033XA Contusion of nose, initial encounter: Secondary | ICD-10-CM

## 2013-10-15 DIAGNOSIS — Z993 Dependence on wheelchair: Secondary | ICD-10-CM | POA: Diagnosis not present

## 2013-10-15 DIAGNOSIS — Z87891 Personal history of nicotine dependence: Secondary | ICD-10-CM | POA: Diagnosis not present

## 2013-10-15 MED ORDER — FLUORESCEIN SODIUM 1 MG OP STRP
1.0000 | ORAL_STRIP | Freq: Once | OPHTHALMIC | Status: DC
  Filled 2013-10-15: qty 1

## 2013-10-15 MED ORDER — TETRACAINE HCL 0.5 % OP SOLN
1.0000 [drp] | Freq: Once | OPHTHALMIC | Status: DC
  Filled 2013-10-15: qty 2

## 2013-10-15 NOTE — ED Provider Notes (Signed)
CSN: [id number]     Arrival date & time 10/15/13  1931 History  This chart was scribed for non-physician practitioner, Antonietta Breach, PA-C,working with Threasa Beards, MD, by Marlowe Kays, ED Scribe. This patient was seen in room WTR5/WTR5 and the patient's care was started at 8:17 PM.  Chief Complaint  Patient presents with  . Fall   The history is provided by the patient. No language interpreter was used.   HPI Comments:  Thomas Ramos is a 43 y.o. quadriplegic male with h/o HTN and cerebral palsy who presents to the Emergency Department complaining of a fall from his wheel chair that occurred approximately one hour ago. Pt states his care taker was helping him back in his chair and he slipped onto a cement floor. He reports hitting his nose on the cement floor. Pt reports associated tenderness of the right-sided forehead bleeding from the nose area and mild epistaxis. He states it feels as if he swallowed a small amount of blood. He denies pain, LOC, visual disturbance, trouble breathing, any new numbness, lower or upper extremity pain, or tinnitus.    Past Medical History  Diagnosis Date  . Motility disorder, esophageal   . Esophageal stricture   . Hypertension   . Pneumonia   . GERD (gastroesophageal reflux disease)   . CP (cerebral palsy)   . S/P Botox injection     approx every 4 months  . Seasonal allergies   . Environmental allergies     takes inhalers if needed  . Quadriplegic spinal paralysis   . Cerebral palsy    Past Surgical History  Procedure Laterality Date  . Tendon release    . Mouth surgery    . Esophagus surgery      stretched esophagus  . Legs    . Esophagogastroduodenoscopy N/A 12/21/2012    Procedure: ESOPHAGOGASTRODUODENOSCOPY (EGD);  Surgeon: Inda Castle, MD;  Location: [facility];  Service: Endoscopy;  Laterality: N/A;  . Botox injection N/A 12/21/2012    Procedure: BOTOX INJECTION;  Surgeon: Inda Castle, MD;  Location: [facility];  Service:  Endoscopy;  Laterality: N/A;   Family History  Problem Relation Age of Onset  . Hypertension Father   . Lung cancer Father   . Diabetes Mother    History  Substance Use Topics  . Smoking status: Former Research scientist (life sciences)  . Smokeless tobacco: Former Systems developer     Comment: smoked one time  . Alcohol Use: 0.0 oz/week     Comment: rare    Review of Systems  HENT: Negative for tinnitus and trouble swallowing.   Eyes: Negative for visual disturbance.  Respiratory: Negative for shortness of breath.   Musculoskeletal: Negative for arthralgias, back pain and neck pain.  Skin: Positive for wound.  Neurological: Negative for syncope and numbness.  All other systems reviewed and are negative.   Allergies  Penicillins  Home Medications   Prior to Admission medications   Medication Sig Start Date End Date Taking? Authorizing Provider  amLODipine (NORVASC) 5 MG tablet Take 5 mg by mouth every evening.   Yes Historical Provider, MD  citalopram (CELEXA) 20 MG tablet Take 20 mg by mouth every evening.   Yes Historical Provider, MD  guaiFENesin (MUCINEX) 600 MG 12 hr tablet Take 600 mg by mouth 2 (two) times daily.   Yes Historical Provider, MD  hydrochlorothiazide 25 MG tablet Take 25 mg by mouth every evening.    Yes Historical Provider, MD  meloxicam (MOBIC) 7.5 MG tablet  Take 7.5 mg by mouth daily.   Yes Historical Provider, MD  Multiple Vitamin (MULTIVITAMIN WITH MINERALS) TABS tablet Take 1 tablet by mouth daily.   Yes Historical Provider, MD  Omega-3 Fatty Acids (FISH OIL) 1000 MG CAPS Take 1 capsule by mouth daily.   Yes Historical Provider, MD  omeprazole (PRILOSEC) 20 MG capsule Take 20 mg by mouth daily.   Yes Historical Provider, MD  oxyCODONE-acetaminophen (PERCOCET/ROXICET) 5-325 MG per tablet Take 1-2 pills every 4-6 hours as needed for pain. 07/19/13  Yes Noland Fordyce, PA-C  PROAIR HFA 108 (90 BASE) MCG/ACT inhaler Inhale 2 puffs into the lungs every 6 (six) hours as needed.  12/29/12  Yes  Historical Provider, MD  propranolol (INDERAL) 40 MG tablet Take 40 mg by mouth 3 (three) times daily.   Yes Historical Provider, MD  psyllium (REGULOID) 0.52 G capsule Take 0.52 g by mouth daily.   Yes Historical Provider, MD   Triage Vitals: BP 142/98  Pulse 101  Temp(Src) 98.6 F (37 C) (Oral)  Resp 16  SpO2 98%  Physical Exam  Nursing note and vitals reviewed. Constitutional: He is oriented to person, place, and time. He appears well-developed and well-nourished. No distress.  Nontoxic/nonseptic appearing  HENT:  Head: Normocephalic.    Nose: Sinus tenderness present. No septal deviation or nasal septal hematoma.  Mouth/Throat: Uvula is midline, oropharynx is clear and moist and mucous membranes are normal.  B/l nares patent. Contusion to bridge of nose with mild associated TTP. Oropharynx clear without signs of dental trauma. Abrasion along zygomatic arch of R cheek with mild swelling. No skull instability.  Eyes: Conjunctivae and EOM are normal. Pupils are equal, round, and reactive to light. No scleral icterus.    Mild contusion to inner aspect of R eye. No proptosis or hyphema. Normal fundoscopic exam. No FB identified. No uptake on fluorescein staining; no corneal abrasion or ulcer. Patient states visual acuity is at baseline.  Neck: Normal range of motion.  Pulmonary/Chest: Effort normal. No respiratory distress.  Chest expansion symmetric.  Musculoskeletal: Normal range of motion.  No deformities to extremities noted.  Neurological: He is alert and oriented to person, place, and time. He exhibits normal muscle tone. Coordination normal.  GCS 15. Patient moving extremities at baseline, per group home staff. No gross sensory deficits appreciated.  Skin: Skin is warm and dry. No rash noted. He is not diaphoretic. No erythema. No pallor.  Psychiatric: He has a normal mood and affect. His behavior is normal.    ED Course  Procedures (including critical care  time) DIAGNOSTIC STUDIES: Oxygen Saturation is 98% on RA, normal by my interpretation.   COORDINATION OF CARE: 8:25 PM- Will apply fluorescein dye to right eye and check for abrasion. Pt verbalizes understanding and agrees to plan.  Medications  tetracaine (PONTOCAINE) 0.5 % ophthalmic solution 1 drop (not administered)  fluorescein ophthalmic strip 1 strip (not administered)   Labs Review Labs Reviewed - No data to display  Imaging Review No results found.   EKG Interpretation None      MDM   Final diagnoses:  Nasal contusion, initial encounter  Abrasion of face, initial encounter  Fall, initial encounter    43 year old male presents to the emergency department for further evaluation of injuries after he fell out of his wheelchair. Patient is wheelchair-bound secondary to quadriplegia. Patient denies loss of consciousness. Patient denies any pain. He does have a contusion to the bridge of his nose. Bilateral nares patent. No septal  deviation or hematoma. There is also a mild periorbital contusion of the right eye. No evidence of corneal abrasion or ulcer on fluorescein staining. No proptosis or hyphema. Imaging not indicated based on work up today. Patient stable and appropriate for discharge with instructions for symptomatic management. Patient has Percocet which he or he takes as needed for pain. Have advised patient to continue with this if needed. Return precautions discussed and provided. Patient agreeable to plan with no unaddressed concerns.  I personally performed the services described in this documentation, which was scribed in my presence. The recorded information has been reviewed and is accurate.   Filed Vitals:   10/15/13 1939 10/15/13 1948  BP: 137/107 142/98  Pulse: 78 101  Temp: 98.7 F (37.1 C) 98.6 F (37 C)  TempSrc: Oral Oral  Resp: 20 16  SpO2: 97% 98%     Antonietta Breach, PA-C 10/16/13 0031

## 2013-10-15 NOTE — ED Notes (Addendum)
Patient from group home and wheelchair bound Patient brought in by group home staff who states that he "slipped and fell out of the wheelchair on the ground"--staff want him checked out, per group home policy Patient currently using urinal and is in NAD Small superficial laceration noted to bridge of nose--bleeding controlled

## 2013-10-15 NOTE — ED Notes (Signed)
ED PA at bedside

## 2013-10-15 NOTE — Discharge Instructions (Signed)
Abrasion °An abrasion is a cut or scrape of the skin. Abrasions do not extend through all layers of the skin and most heal within 10 days. It is important to care for your abrasion properly to prevent infection. °CAUSES  °Most abrasions are caused by falling on, or gliding across, the ground or other surface. When your skin rubs on something, the outer and inner layer of skin rubs off, causing an abrasion. °DIAGNOSIS  °Your caregiver will be able to diagnose an abrasion during a physical exam.  °TREATMENT  °Your treatment depends on how large and deep the abrasion is. Generally, your abrasion will be cleaned with water and a mild soap to remove any dirt or debris. An antibiotic ointment may be put over the abrasion to prevent an infection. A bandage (dressing) may be wrapped around the abrasion to keep it from getting dirty.  °You may need a tetanus shot if: °· You cannot remember when you had your last tetanus shot. °· You have never had a tetanus shot. °· The injury broke your skin. °If you get a tetanus shot, your arm may swell, get red, and feel warm to the touch. This is common and not a problem. If you need a tetanus shot and you choose not to have one, there is a rare chance of getting tetanus. Sickness from tetanus can be serious.  °HOME CARE INSTRUCTIONS  °· If a dressing was applied, change it at least once a day or as directed by your caregiver. If the bandage sticks, soak it off with warm water.   °· Wash the area with water and a mild soap to remove all the ointment 2 times a day. Rinse off the soap and pat the area dry with a clean towel.   °· Reapply any ointment as directed by your caregiver. This will help prevent infection and keep the bandage from sticking. Use gauze over the wound and under the dressing to help keep the bandage from sticking.   °· Change your dressing right away if it becomes wet or dirty.   °· Only take over-the-counter or prescription medicines for pain, discomfort, or fever as  directed by your caregiver.   °· Follow up with your caregiver within 24-48 hours for a wound check, or as directed. If you were not given a wound-check appointment, look closely at your abrasion for redness, swelling, or pus. These are signs of infection. °SEEK IMMEDIATE MEDICAL CARE IF:  °· You have increasing pain in the wound.   °· You have redness, swelling, or tenderness around the wound.   °· You have pus coming from the wound.   °· You have a fever or persistent symptoms for more than 2-3 days. °· You have a fever and your symptoms suddenly get worse. °· You have a bad smell coming from the wound or dressing.   °MAKE SURE YOU:  °· Understand these instructions. °· Will watch your condition. °· Will get help right away if you are not doing well or get worse. °Document Released: 12/10/2004 Document Revised: 02/17/2012 Document Reviewed: 02/03/2011 °[employer]® Patient Information ©2015 [employer]. This information is not intended to replace advice given to you by your health care provider. Make sure you discuss any questions you have with your health care provider. °Contusion °A contusion is a deep bruise. Contusions are the result of an injury that caused bleeding under the skin. The contusion may turn blue, purple, or yellow. Minor injuries will give you a painless contusion, but more severe contusions may stay painful and swollen for   a few weeks.  °CAUSES  °A contusion is usually caused by a blow, trauma, or direct force to an area of the body. °SYMPTOMS  °· Swelling and redness of the injured area. °· Bruising of the injured area. °· Tenderness and soreness of the injured area. °· Pain. °DIAGNOSIS  °The diagnosis can be made by taking a history and physical exam. An X-ray, CT scan, or MRI may be needed to determine if there were any associated injuries, such as fractures. °TREATMENT  °Specific treatment will depend on what area of the body was injured. In general, the best treatment for a contusion is  resting, icing, elevating, and applying cold compresses to the injured area. Over-the-counter medicines may also be recommended for pain control. Ask your caregiver what the best treatment is for your contusion. °HOME CARE INSTRUCTIONS  °· Put ice on the injured area. °¨ Put ice in a plastic bag. °¨ Place a towel between your skin and the bag. °¨ Leave the ice on for 15-20 minutes, 3-4 times a day, or as directed by your health care provider. °· Only take over-the-counter or prescription medicines for pain, discomfort, or fever as directed by your caregiver. Your caregiver may recommend avoiding anti-inflammatory medicines (aspirin, ibuprofen, and naproxen) for 48 hours because these medicines may increase bruising. °· Rest the injured area. °· If possible, elevate the injured area to reduce swelling. °SEEK IMMEDIATE MEDICAL CARE IF:  °· You have increased bruising or swelling. °· You have pain that is getting worse. °· Your swelling or pain is not relieved with medicines. °MAKE SURE YOU:  °· Understand these instructions. °· Will watch your condition. °· Will get help right away if you are not doing well or get worse. °Document Released: 12/10/2004 Document Revised: 03/07/2013 Document Reviewed: 01/05/2011 °[employer]® Patient Information ©2015 [employer]. This information is not intended to replace advice given to you by your health care provider. Make sure you discuss any questions you have with your health care provider. ° °

## 2013-10-17 NOTE — ED Provider Notes (Signed)
Medical screening examination/treatment/procedure(s) were performed by non-physician practitioner and as supervising physician I was immediately available for consultation/collaboration.   EKG Interpretation None       Threasa Beards, MD 10/17/13 1500

## 2013-11-08 ENCOUNTER — Ambulatory Visit: Payer: Medicare Other | Admitting: Neurology

## 2013-11-15 ENCOUNTER — Ambulatory Visit (INDEPENDENT_AMBULATORY_CARE_PROVIDER_SITE_OTHER): Payer: Medicare Other | Admitting: Neurology

## 2013-11-15 ENCOUNTER — Encounter: Payer: Self-pay | Admitting: Neurology

## 2013-11-15 DIAGNOSIS — R1310 Dysphagia, unspecified: Secondary | ICD-10-CM

## 2013-11-15 DIAGNOSIS — G8250 Quadriplegia, unspecified: Secondary | ICD-10-CM

## 2013-11-15 DIAGNOSIS — G809 Cerebral palsy, unspecified: Secondary | ICD-10-CM

## 2013-11-15 MED ORDER — ONABOTULINUMTOXINA 100 UNITS IJ SOLR
300.0000 [IU] | Freq: Once | INTRAMUSCULAR | Status: AC
  Administered 2013-11-15: 300 [IU] via INTRAMUSCULAR

## 2013-11-15 NOTE — Progress Notes (Signed)
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    Since 2010, he was under the care of cerebral palsy specialist Dr. Gilford Rile, at [facility], for EMG guided Botox injection for the right upper extremity spasticity, he reported moderate improvement, he has less spastic right upper extremity pain after the injection. Last injection by Dr. Gilford Rile was in March 2012 . He is not ambulatory, denies significant pain in bilateral lower extremity, with fixed contraction. He transferred his injection to our office for convenience since May 2012, he received EMG guided BOTOX injection every 3 months.  He recently moved to a different living facility,  UPDATE Sep 2015:  Last injection was in May 27th 2015,he was able to relax his right arm, less pain, there was no significant side effect, he is at  different facility now  Physical Exam  Neck: supple no carotid bruits Respiratory: clear to auscultation bilaterally Cardiovascular: regular rate rhythm  Neurologic Exam  Mental Status:  wheelchair bound, spastic slow talking, following commands Cranial Nerves: CN II-XII pupils were equal round reactive to light.  spontaneous nystagmus, conjugate eye movements, difficulty to hold right gaze,  he was able to read large print.   Facial sensation and strength were normal.    Uvula tongue were midline.  Head turning and shoulder shrugging were spastic and symmetric. difficulty with tongue protrusion. Motor:  spastic quadriplegia, relative free movement in left arm, contracted right upper  extremity and bilateral lower extremity,  right pectoralis muscle hypertrophy, tender, right arm stayed at right elbow flexion, fixed  at 160, elbow pronation, forceful wrist flexion, finger flexion at metacarpophalangeal joints, thumb in position,extended finger at proximal and distal interphalangeal joints. fixed right wrist flexion, maximum 90 degree, fixed bilateral knee flexion. He has mild left hip flexion movement 2/5, right hip flexion 0 Sensory:  not reliable Coordination:  There was no dysmetria noticed. 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  100 units of BOTOX/2 cc of NS.  Right biceps 50 Right brachialis 50   Right  flexor carpi ulnaris  50 units Right flexor digitorum profundus 50 units  Right palmaris longus 25  Right flexor carpi radialis 25 Right pronator teres 50 units  He tolerated the injection well, will return in 3 months for repeat injections.

## 2014-02-22 ENCOUNTER — Ambulatory Visit (INDEPENDENT_AMBULATORY_CARE_PROVIDER_SITE_OTHER): Payer: Medicare Other | Admitting: Neurology

## 2014-02-22 ENCOUNTER — Encounter: Payer: Self-pay | Admitting: Neurology

## 2014-02-22 DIAGNOSIS — R1310 Dysphagia, unspecified: Secondary | ICD-10-CM

## 2014-02-22 DIAGNOSIS — G809 Cerebral palsy, unspecified: Secondary | ICD-10-CM

## 2014-02-22 MED ORDER — ONABOTULINUMTOXINA 100 UNITS IJ SOLR
300.0000 [IU] | Freq: Once | INTRAMUSCULAR | Status: AC
  Administered 2014-02-22: 300 [IU] via INTRAMUSCULAR

## 2014-02-22 NOTE — Progress Notes (Signed)
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    Since 2010, he was under the care of cerebral palsy specialist Dr. Gilford Rile, at [facility], for EMG guided Botox injection for the right upper extremity spasticity, he reported moderate improvement, he has less spastic right upper extremity pain after the injection. Last injection by Dr. Gilford Rile was in March 2012 . He is not ambulatory, denies significant pain in bilateral lower extremity, with fixed contraction. He transferred his injection to our office for convenience since May 2012, he received EMG guided BOTOX injection every 3 months.  He recently moved to a different living facility,  UPDATE Sep 2015:  Last injection was in Sep 2015,he was able to relax his right arm, less pain, there was no significant side effect, he moved to  different facility again  Physical Exam  Neck: supple no carotid bruits Respiratory: clear to auscultation bilaterally Cardiovascular: regular rate rhythm  Neurologic Exam  Mental Status:  wheelchair bound, spastic slow talking, following commands Cranial Nerves: CN II-XII pupils were equal round reactive to light.  spontaneous nystagmus, conjugate eye movements, difficulty to hold right gaze,  he was able to read large print.   Facial sensation and strength were normal.    Uvula tongue were midline.  Head turning and shoulder shrugging were spastic and symmetric. difficulty with tongue protrusion. Motor:  spastic quadriplegia, relative free movement in left arm, contracted right upper  extremity and bilateral lower extremity,  right pectoralis muscle hypertrophy, tender, right arm stayed at right elbow flexion, fixed  at 160, elbow pronation, forceful wrist flexion, finger flexion at metacarpophalangeal joints, thumb in position,extended finger at proximal and distal interphalangeal joints. fixed right wrist flexion, maximum 90 degree, fixed bilateral knee flexion. He has mild left hip flexion movement 1/5, right hip flexion 0 Sensory:  not reliable Coordination:  There was no dysmetria noticed. 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  100 units of BOTOX/2 cc of NS.  Right biceps 25 Right brachialis 50   Right  flexor carpi ulnaris  50 units  Right pectoralis major 50x2= 100  Right palmaris longus 25  Right flexor carpi radialis 25 Right pronator teres 25 units  He tolerated the injection well, will return in 3 months for repeat injections.  Marcial Pacas, M.D. Ph.D.  [facility] [place], [place] [address] Phone: [phone] Fax:      [phone]

## 2014-05-24 ENCOUNTER — Ambulatory Visit: Payer: Medicare Other | Admitting: Neurology

## 2014-05-24 ENCOUNTER — Emergency Department (HOSPITAL_COMMUNITY): Payer: Medicare Other

## 2014-05-24 ENCOUNTER — Encounter (HOSPITAL_COMMUNITY): Payer: Self-pay | Admitting: *Deleted

## 2014-05-24 ENCOUNTER — Inpatient Hospital Stay (HOSPITAL_COMMUNITY)
Admission: EM | Admit: 2014-05-24 | Discharge: 2014-05-28 | DRG: 193 | Disposition: A | Discharge status: Home Health | Payer: Medicare Other | Attending: Internal Medicine | Admitting: Internal Medicine

## 2014-05-24 DIAGNOSIS — J1100 Influenza due to unidentified influenza virus with unspecified type of pneumonia: Secondary | ICD-10-CM | POA: Insufficient documentation

## 2014-05-24 DIAGNOSIS — G809 Cerebral palsy, unspecified: Secondary | ICD-10-CM | POA: Diagnosis present

## 2014-05-24 DIAGNOSIS — R0602 Shortness of breath: Secondary | ICD-10-CM | POA: Diagnosis not present

## 2014-05-24 DIAGNOSIS — R509 Fever, unspecified: Secondary | ICD-10-CM

## 2014-05-24 DIAGNOSIS — I10 Essential (primary) hypertension: Secondary | ICD-10-CM

## 2014-05-24 DIAGNOSIS — R1314 Dysphagia, pharyngoesophageal phase: Secondary | ICD-10-CM | POA: Diagnosis not present

## 2014-05-24 DIAGNOSIS — Z993 Dependence on wheelchair: Secondary | ICD-10-CM | POA: Diagnosis not present

## 2014-05-24 DIAGNOSIS — E785 Hyperlipidemia, unspecified: Secondary | ICD-10-CM | POA: Diagnosis present

## 2014-05-24 DIAGNOSIS — R1312 Dysphagia, oropharyngeal phase: Secondary | ICD-10-CM | POA: Diagnosis present

## 2014-05-24 DIAGNOSIS — Z801 Family history of malignant neoplasm of trachea, bronchus and lung: Secondary | ICD-10-CM

## 2014-05-24 DIAGNOSIS — R110 Nausea: Secondary | ICD-10-CM | POA: Diagnosis not present

## 2014-05-24 DIAGNOSIS — Z79899 Other long term (current) drug therapy: Secondary | ICD-10-CM

## 2014-05-24 DIAGNOSIS — Z8249 Family history of ischemic heart disease and other diseases of the circulatory system: Secondary | ICD-10-CM

## 2014-05-24 DIAGNOSIS — J690 Pneumonitis due to inhalation of food and vomit: Secondary | ICD-10-CM | POA: Diagnosis present

## 2014-05-24 DIAGNOSIS — R0902 Hypoxemia: Secondary | ICD-10-CM | POA: Diagnosis present

## 2014-05-24 DIAGNOSIS — R1310 Dysphagia, unspecified: Secondary | ICD-10-CM | POA: Diagnosis present

## 2014-05-24 DIAGNOSIS — K226 Gastro-esophageal laceration-hemorrhage syndrome: Secondary | ICD-10-CM | POA: Diagnosis present

## 2014-05-24 DIAGNOSIS — Z880 Allergy status to penicillin: Secondary | ICD-10-CM | POA: Diagnosis not present

## 2014-05-24 DIAGNOSIS — Z87891 Personal history of nicotine dependence: Secondary | ICD-10-CM | POA: Diagnosis not present

## 2014-05-24 DIAGNOSIS — K220 Achalasia of cardia: Secondary | ICD-10-CM | POA: Diagnosis present

## 2014-05-24 DIAGNOSIS — K567 Ileus, unspecified: Secondary | ICD-10-CM

## 2014-05-24 DIAGNOSIS — K219 Gastro-esophageal reflux disease without esophagitis: Secondary | ICD-10-CM | POA: Diagnosis present

## 2014-05-24 DIAGNOSIS — Z833 Family history of diabetes mellitus: Secondary | ICD-10-CM

## 2014-05-24 DIAGNOSIS — R112 Nausea with vomiting, unspecified: Secondary | ICD-10-CM | POA: Diagnosis not present

## 2014-05-24 DIAGNOSIS — K920 Hematemesis: Secondary | ICD-10-CM | POA: Insufficient documentation

## 2014-05-24 DIAGNOSIS — J09X1 Influenza due to identified novel influenza A virus with pneumonia: Secondary | ICD-10-CM | POA: Diagnosis not present

## 2014-05-24 DIAGNOSIS — G801 Spastic diplegic cerebral palsy: Secondary | ICD-10-CM | POA: Diagnosis present

## 2014-05-24 LAB — BASIC METABOLIC PANEL
Anion gap: 9 (ref 5–15)
BUN: 14 mg/dL (ref 6–23)
CHLORIDE: 105 mmol/L (ref 96–112)
CO2: 26 mmol/L (ref 19–32)
Calcium: 9.4 mg/dL (ref 8.4–10.5)
Creatinine, Ser: 0.73 mg/dL (ref 0.50–1.35)
GFR calc Af Amer: >90 mL/min (ref >90)
Glucose, Bld: 119 mg/dL — ABNORMAL HIGH (ref 70–99)
Potassium: 3.8 mmol/L (ref 3.5–5.1)
SODIUM: 140 mmol/L (ref 135–145)

## 2014-05-24 LAB — HEPATIC FUNCTION PANEL
ALK PHOS: 109 U/L (ref 39–117)
ALT: 20 U/L (ref 0–53)
AST: 24 U/L (ref 0–37)
Albumin: 4.2 g/dL (ref 3.5–5.2)
BILIRUBIN INDIRECT: 0.2 mg/dL — AB (ref 0.3–0.9)
BILIRUBIN TOTAL: 0.3 mg/dL (ref 0.3–1.2)
Bilirubin, Direct: 0.1 mg/dL (ref 0.0–0.5)
Total Protein: 7.3 g/dL (ref 6.0–8.3)

## 2014-05-24 LAB — CBC WITH DIFFERENTIAL/PLATELET
BASOS ABS: 0 10*3/uL (ref 0.0–0.1)
BASOS PCT: 0 % (ref 0–1)
Eosinophils Absolute: 0.1 10*3/uL (ref 0.0–0.7)
Eosinophils Relative: 1 % (ref 0–5)
HEMATOCRIT: 42.1 % (ref 39.0–52.0)
HEMOGLOBIN: 14.6 g/dL (ref 13.0–17.0)
LYMPHS PCT: 15 % (ref 12–46)
Lymphs Abs: 1.4 10*3/uL (ref 0.7–4.0)
MCH: 28.7 pg (ref 26.0–34.0)
MCHC: 34.7 g/dL (ref 30.0–36.0)
MCV: 82.9 fL (ref 78.0–100.0)
MONO ABS: 1.1 10*3/uL — AB (ref 0.1–1.0)
Monocytes Relative: 11 % (ref 3–12)
NEUTROS ABS: 7.2 10*3/uL (ref 1.7–7.7)
NEUTROS PCT: 73 % (ref 43–77)
PLATELETS: 232 10*3/uL (ref 150–400)
RBC: 5.08 MIL/uL (ref 4.22–5.81)
RDW: 12.7 % (ref 11.5–15.5)
WBC: 9.9 10*3/uL (ref 4.0–10.5)

## 2014-05-24 LAB — URINALYSIS, ROUTINE W REFLEX MICROSCOPIC
BILIRUBIN URINE: NEGATIVE
GLUCOSE, UA: 250 mg/dL — AB
HGB URINE DIPSTICK: NEGATIVE
Ketones, ur: NEGATIVE mg/dL
Leukocytes, UA: NEGATIVE
Nitrite: NEGATIVE
PH: 5.5 (ref 5.0–8.0)
PROTEIN: 30 mg/dL — AB
SPECIFIC GRAVITY, URINE: 1.027 (ref 1.005–1.030)
Urobilinogen, UA: 1 mg/dL (ref 0.0–1.0)

## 2014-05-24 LAB — URINE MICROSCOPIC-ADD ON

## 2014-05-24 LAB — I-STAT CG4 LACTIC ACID, ED: LACTIC ACID, VENOUS: 2.54 mmol/L — AB (ref 0.5–2.0)

## 2014-05-24 LAB — LIPASE, BLOOD: LIPASE: 24 U/L (ref 11–59)

## 2014-05-24 MED ORDER — ACETAMINOPHEN 650 MG RE SUPP
650.0000 mg | Freq: Once | RECTAL | Status: AC
  Administered 2014-05-24: 650 mg via RECTAL
  Filled 2014-05-24: qty 1

## 2014-05-24 MED ORDER — ACETAMINOPHEN 325 MG PO TABS: 650.0000 mg | ORAL_TABLET | Freq: Once | ORAL | Status: DC

## 2014-05-24 MED ORDER — CLINDAMYCIN PHOSPHATE 900 MG/50ML IV SOLN
900.0000 mg | Freq: Once | INTRAVENOUS | Status: AC
  Administered 2014-05-25: 900 mg via INTRAVENOUS
  Filled 2014-05-24: qty 50

## 2014-05-24 MED ORDER — SODIUM CHLORIDE 0.9 % IV BOLUS (SEPSIS)
500.0000 mL | Freq: Once | INTRAVENOUS | Status: AC
  Administered 2014-05-24: 500 mL via INTRAVENOUS

## 2014-05-24 MED ORDER — SODIUM CHLORIDE 0.9 % IV SOLN: Freq: Once | INTRAVENOUS | Status: DC

## 2014-05-24 MED ORDER — ALBUTEROL SULFATE (2.5 MG/3ML) 0.083% IN NEBU
2.5000 mg | INHALATION_SOLUTION | RESPIRATORY_TRACT | Status: DC | PRN
  Administered 2014-05-24: 2.5 mg via RESPIRATORY_TRACT
  Filled 2014-05-24: qty 3

## 2014-05-24 MED ORDER — ONDANSETRON HCL 4 MG/2ML IJ SOLN
4.0000 mg | Freq: Once | INTRAMUSCULAR | Status: AC
  Administered 2014-05-25: 4 mg via INTRAVENOUS
  Filled 2014-05-24: qty 2

## 2014-05-24 NOTE — ED Notes (Signed)
Pt arrives from home via [facility]. Pt c/o back pain and leg pain that started to day. Pt has n/v. Pt has hx of CP. 137/95 [id number] [id number]

## 2014-05-24 NOTE — ED Notes (Signed)
ATTEMPTED IN AND OUT BUT UNSUCCESSFUL

## 2014-05-24 NOTE — ED Provider Notes (Signed)
CSN: [id number]     Arrival date & time 05/24/14  2024 History   First MD Initiated Contact with Patient 05/24/14 2031     Chief Complaint  Patient presents with  . Back Pain  . Leg Pain     HPI  Patient presents via EMS from his group home. Complains of back pain. Also states that he's had a cough and nausea and vomiting through most of the day. Upon arrival was hypoxemic and febrile.  Patient is able to provide his own history. Assisted by his group home staff. History of spastic CP. Gets frequent RUE Botox injections. History of dysphasia. According to the staff used to get "Botox in his neck because of the difficulty swallowing".  Per chart review has a previous diagnosis of achalasia. Never had formal manometry, but diagnosis clinically, M.D. a barium swallow. Has had prolonged response to Botox injections, therefore has never undergone balloon dilatation, or myotomy.    He is scheduled for a swallow study (per his Group home staff)  and possibly additional injections, because of increasing difficulty with eating and drinking and  coughing and choking. Caregiver states that once or twice per week they "have to do the Heimlich" because of choking.  Went to [employer] today. Upon returning home he complained of some body aches. Particularly in  his low back. No leg symptoms. Is normally in a wheelchair. Symptoms progressed with fevers and shakes and chills and progressive coughing and sensation of choking.   Past Medical History  Diagnosis Date  . Motility disorder, esophageal   . Esophageal stricture   . Hypertension   . Pneumonia   . GERD (gastroesophageal reflux disease)   . CP (cerebral palsy)   . S/P Botox injection     approx every 4 months  . Seasonal allergies   . Environmental allergies     takes inhalers if needed  . Quadriplegic spinal paralysis   . Cerebral palsy    Past Surgical History  Procedure Laterality Date  . Tendon release    . Mouth surgery    . Esophagus  surgery      stretched esophagus  . Legs    . Esophagogastroduodenoscopy N/A 12/21/2012    Procedure: ESOPHAGOGASTRODUODENOSCOPY (EGD);  Surgeon: Inda Castle, MD;  Location: [facility];  Service: Endoscopy;  Laterality: N/A;  . Botox injection N/A 12/21/2012    Procedure: BOTOX INJECTION;  Surgeon: Inda Castle, MD;  Location: [facility];  Service: Endoscopy;  Laterality: N/A;   Family History  Problem Relation Age of Onset  . Hypertension Father   . Lung cancer Father   . Diabetes Mother    History  Substance Use Topics  . Smoking status: Former Research scientist (life sciences)  . Smokeless tobacco: Former Systems developer     Comment: smoked one time  . Alcohol Use: 0.0 oz/week     Comment: rare    Review of Systems  Constitutional: Positive for fever, chills, diaphoresis and fatigue. Negative for appetite change.  HENT: Negative for mouth sores, sore throat and trouble swallowing.   Eyes: Negative for visual disturbance.  Respiratory: Positive for cough, choking and shortness of breath. Negative for chest tightness and wheezing.   Cardiovascular: Negative for chest pain.  Gastrointestinal: Positive for nausea and vomiting. Negative for abdominal pain, diarrhea and abdominal distention.  Endocrine: Negative for polydipsia, polyphagia and polyuria.  Genitourinary: Negative for dysuria, frequency and hematuria.  Musculoskeletal: Negative for gait problem.  Skin: Negative for color change, pallor and rash.  Neurological: Negative for dizziness, syncope, light-headedness and headaches.  Hematological: Does not bruise/bleed easily.  Psychiatric/Behavioral: Negative for behavioral problems and confusion.      Allergies  Penicillins  Home Medications   Prior to Admission medications   Medication Sig Start Date End Date Taking? Authorizing Provider  amLODipine (NORVASC) 5 MG tablet Take 5 mg by mouth every evening.   Yes Historical Provider, MD  cetirizine (ZYRTEC) 10 MG tablet Take 10 mg by mouth  daily.  03/01/14  Yes Historical Provider, MD  citalopram (CELEXA) 20 MG tablet Take 20 mg by mouth every evening.   Yes Historical Provider, MD  CRESTOR 5 MG tablet Take 5 mg by mouth at bedtime. 04/22/14  Yes Historical Provider, MD  econazole nitrate 1 % cream Apply 1 application topically daily.  05/16/14 05/16/15 Yes Historical Provider, MD  fluticasone (FLONASE) 50 MCG/ACT nasal spray Place 1 spray into both nostrils daily.  03/01/14  Yes Historical Provider, MD  meloxicam (MOBIC) 7.5 MG tablet Take 7.5 mg by mouth daily.   Yes Historical Provider, MD  montelukast (SINGULAIR) 10 MG tablet Take 10 mg by mouth at bedtime.  03/01/14 03/01/15 Yes Historical Provider, MD  Multiple Vitamin (MULTIVITAMIN WITH MINERALS) TABS tablet Take 1 tablet by mouth daily.   Yes Historical Provider, MD  Omega-3 Fatty Acids (FISH OIL) 1000 MG CAPS Take 1 capsule by mouth daily.   Yes Historical Provider, MD  PROAIR HFA 108 (90 BASE) MCG/ACT inhaler Inhale 2 puffs into the lungs every 6 (six) hours as needed for wheezing or shortness of breath.  12/29/12  Yes Historical Provider, MD  ranitidine (ZANTAC) 150 MG tablet Take 150 mg by mouth at bedtime.  04/30/14 04/30/15 Yes Historical Provider, MD  guaiFENesin (MUCINEX) 600 MG 12 hr tablet Take 600 mg by mouth 2 (two) times daily.    Historical Provider, MD  hydrochlorothiazide 25 MG tablet Take 25 mg by mouth every evening.     Historical Provider, MD  omeprazole (PRILOSEC) 20 MG capsule Take 20 mg by mouth daily.    Historical Provider, MD  oxyCODONE-acetaminophen (PERCOCET/ROXICET) 5-325 MG per tablet Take 1-2 pills every 4-6 hours as needed for pain. Patient not taking: Reported on 05/24/2014 07/19/13   Noland Fordyce, PA-C  propranolol (INDERAL) 40 MG tablet Take 40 mg by mouth 3 (three) times daily.    Historical Provider, MD  psyllium (REGULOID) 0.52 G capsule Take 0.52 g by mouth daily.    Historical Provider, MD   BP 103/66 mmHg  Pulse 73  Temp(Src) 99 F (37.2 C)  (Oral)  Resp 24  Ht 4\' 10"  (1.473 m)  Wt 161 lb 6.4 oz (73.211 kg)  BMI 33.74 kg/m2  SpO2 95% Physical Exam  Constitutional: He is oriented to person, place, and time. He appears ill. No distress.  Awake. Legs flexed at the hips and knees. Is able to speak in full sentences and give details of his history. Appears ill, but mentating well.  HENT:  Head: Normocephalic.  Frequent coughing and clearing of his throat.  Eyes: Conjunctivae are normal. Pupils are equal, round, and reactive to light. No scleral icterus.  Neck: Normal range of motion. Neck supple. No thyromegaly present.  Cardiovascular: Normal rate and regular rhythm.  Exam reveals no gallop and no friction rub.   No murmur heard. Tachycardic. Regular. Sinus tach 112 on the monitor.  Pulmonary/Chest:  Distant breath sounds. Diffuse rhonchi.  No increased work of breathing.  Abdominal: Soft. Bowel sounds are normal. He  exhibits no distension. There is no tenderness. There is no rebound.  Soft benign abdomen. No complaint of tenderness.  Musculoskeletal: Normal range of motion.       Back:  Paraspinal muscular tenderness in the low back. No midline tenderness. Denies radicular pain.  Neurological: He is alert and oriented to person, place, and time.  States normal sensation to his legs. Able to move both lower extremities. Has spastic limitation of his right upper extremity.  Skin: Skin is warm and dry. No rash noted.  Psychiatric: He has a normal mood and affect. His behavior is normal.    ED Course  Procedures (including critical care time) Labs Review Labs Reviewed  CBC WITH DIFFERENTIAL/PLATELET - Abnormal; Notable for the following:    Monocytes Absolute 1.1 (*)    All other components within normal limits  BASIC METABOLIC PANEL - Abnormal; Notable for the following:    Glucose, Bld 119 (*)    All other components within normal limits  HEPATIC FUNCTION PANEL - Abnormal; Notable for the following:    Indirect  Bilirubin 0.2 (*)    All other components within normal limits  URINALYSIS, ROUTINE W REFLEX MICROSCOPIC - Abnormal; Notable for the following:    Glucose, UA 250 (*)    Protein, ur 30 (*)    All other components within normal limits  URINE MICROSCOPIC-ADD ON - Abnormal; Notable for the following:    Bacteria, UA FEW (*)    Casts HYALINE CASTS (*)    All other components within normal limits  HEMOGLOBIN A1C - Abnormal; Notable for the following:    Hgb A1c MFr Bld 6.2 (*)    All other components within normal limits  CBC - Abnormal; Notable for the following:    Hemoglobin 12.3 (*)    HCT 36.6 (*)    All other components within normal limits  INFLUENZA PANEL BY PCR (TYPE A & B, H1N1) - Abnormal; Notable for the following:    Influenza A By PCR POSITIVE (*)    H1N1 flu by pcr DETECTED (*)    All other components within normal limits  GLUCOSE, CAPILLARY - Abnormal; Notable for the following:    Glucose-Capillary 109 (*)    All other components within normal limits  GLUCOSE, CAPILLARY - Abnormal; Notable for the following:    Glucose-Capillary 63 (*)    All other components within normal limits  GLUCOSE, CAPILLARY - Abnormal; Notable for the following:    Glucose-Capillary 190 (*)    All other components within normal limits  CBC - Abnormal; Notable for the following:    WBC 3.1 (*)    RBC 4.21 (*)    Hemoglobin 11.8 (*)    HCT 37.0 (*)    Platelets 129 (*)    All other components within normal limits  GLUCOSE, CAPILLARY - Abnormal; Notable for the following:    Glucose-Capillary 106 (*)    All other components within normal limits  URINALYSIS, ROUTINE W REFLEX MICROSCOPIC - Abnormal; Notable for the following:    Color, Urine AMBER (*)    Specific Gravity, Urine 1.035 (*)    Bilirubin Urine SMALL (*)    Ketones, ur >80 (*)    Protein, ur 30 (*)    All other components within normal limits  CBC - Abnormal; Notable for the following:    WBC 3.4 (*)    RBC 4.16 (*)     Hemoglobin 11.6 (*)    HCT 36.0 (*)    Platelets  129 (*)    All other components within normal limits  BASIC METABOLIC PANEL - Abnormal; Notable for the following:    Glucose, Bld 106 (*)    Calcium 8.2 (*)    All other components within normal limits  GLUCOSE, CAPILLARY - Abnormal; Notable for the following:    Glucose-Capillary 101 (*)    All other components within normal limits  GLUCOSE, CAPILLARY - Abnormal; Notable for the following:    Glucose-Capillary 125 (*)    All other components within normal limits  GLUCOSE, CAPILLARY - Abnormal; Notable for the following:    Glucose-Capillary 107 (*)    All other components within normal limits  HEMOGLOBIN AND HEMATOCRIT, BLOOD - Abnormal; Notable for the following:    Hemoglobin 12.7 (*)    HCT 38.1 (*)    All other components within normal limits  HEMOGLOBIN AND HEMATOCRIT, BLOOD - Abnormal; Notable for the following:    Hemoglobin 12.5 (*)    HCT 37.9 (*)    All other components within normal limits  HEMOGLOBIN AND HEMATOCRIT, BLOOD - Abnormal; Notable for the following:    Hemoglobin 11.8 (*)    HCT 35.9 (*)    All other components within normal limits  GLUCOSE, CAPILLARY - Abnormal; Notable for the following:    Glucose-Capillary 103 (*)    All other components within normal limits  GLUCOSE, CAPILLARY - Abnormal; Notable for the following:    Glucose-Capillary 103 (*)    All other components within normal limits  I-STAT CG4 LACTIC ACID, ED - Abnormal; Notable for the following:    Lactic Acid, Venous 2.54 (*)    All other components within normal limits  CULTURE, BLOOD (ROUTINE X 2)  CULTURE, BLOOD (ROUTINE X 2)  URINE CULTURE  MRSA PCR SCREENING  CULTURE, BLOOD (ROUTINE X 2)  CULTURE, BLOOD (ROUTINE X 2)  LIPASE, BLOOD  TSH  COMPREHENSIVE METABOLIC PANEL  GLUCOSE, CAPILLARY  BASIC METABOLIC PANEL  URINE MICROSCOPIC-ADD ON  GLUCOSE, CAPILLARY  GLUCOSE, CAPILLARY  CBC  BASIC METABOLIC PANEL  TYPE AND SCREEN    ABO/RH    Imaging Review Dg Chest 2 View  05/26/2014   CLINICAL DATA:  One day history of cough and fever.  EXAM: CHEST  2 VIEW  COMPARISON:  05/24/2014 dating back to 11/19/2011.  FINDINGS: Suboptimal inspiration accounts for crowded bronchovascular markings diffusely and atelectasis in the bases, and accentuates the cardiac silhouette. Taking this into account, cardiac silhouette upper normal in size to slightly enlarged. Lungs otherwise clear. No localized airspace consolidation. No pleural effusions. No pneumothorax. Normal pulmonary vascularity. Residual barium in the stomach and in the visualized colon from the swallowing function study earlier today.  IMPRESSION: Suboptimal inspiration accounts for bibasilar atelectasis. No acute cardiopulmonary disease otherwise.   Electronically Signed   By: Evangeline Dakin M.D.   On: 05/26/2014 16:22   Dg Abd Portable 1v  05/27/2014   CLINICAL DATA:  Ileus.  Coffee ground emesis.  EXAM: PORTABLE ABDOMEN - 1 VIEW  COMPARISON:  No prior abdominal exams.  FINDINGS: There is contrast throughout distal small bowel and colon from barium swallow performed 1 day prior. No bowel obstruction. Moderate stool distending the rectum. Mild gaseous distention of small bowel loops in the central abdomen consistent with ileus. There is mild gastric gastric distension. Evaluation for radiopaque calculi is limited due to the presence of oral contrast. There is no evidence pneumatosis or free air. Dysplastic appearance of the left hip.  IMPRESSION: Findings consistent with  ileus. There is oral contrast throughout the distal small bowel in colon from barium swallow performed 1 day prior, no obstruction.   Electronically Signed   By: Jeb Levering M.D.   On: 05/27/2014 01:48   Dg Swallowing Func-speech Pathology  05/26/2014   Lorre Nick, CCC-SLP     05/26/2014  1:16 PM  Objective Swallowing Evaluation: Modified Barium Swallowing Study   Patient Details  Name: MORIS RATCHFORD  MRN: [id number] Date of Birth: August 18, 1970  Today's Date: 05/26/2014 Time: SLP Start Time (ACUTE ONLY): 1215-SLP Stop Time (ACUTE  ONLY): 1245 SLP Time Calculation (min) (ACUTE ONLY): 30 min  Past Medical History:  Past Medical History  Diagnosis Date  . Motility disorder, esophageal   . Esophageal stricture   . Hypertension   . Pneumonia   . GERD (gastroesophageal reflux disease)   . CP (cerebral palsy)   . S/P Botox injection     approx every 4 months  . Seasonal allergies   . Environmental allergies     takes inhalers if needed  . Quadriplegic spinal paralysis   . Cerebral palsy    Past Surgical History:  Past Surgical History  Procedure Laterality Date  . Tendon release    . Mouth surgery    . Esophagus surgery      stretched esophagus  . Legs    . Esophagogastroduodenoscopy N/A 12/21/2012    Procedure: ESOPHAGOGASTRODUODENOSCOPY (EGD);  Surgeon: Inda Castle, MD;  Location: [facility];  Service: Endoscopy;   Laterality: N/A;  . Botox injection N/A 12/21/2012    Procedure: BOTOX INJECTION;  Surgeon: Inda Castle, MD;   Location: [facility];  Service: Endoscopy;  Laterality: N/A;   HPI:  HPI: VETO MACQUEEN is a 44 year old male who presents with cough  and pneumonia. Patient states that he has been having episode of  choking and gagging. He states that anytime he eats or drinks he  has noted that he chokes and that he has reportedly had vomiting.  He feels as though it goes down his lungs. He has had fevers also  noted in the ED. In addition he also has a history of achalasia  and has had attempts at stretching his esophagus in the past. BSE  completed, with recommendation to proceed with MBS.  No Data Recorded  Assessment / Plan / Recommendation CHL IP CLINICAL IMPRESSIONS 05/26/2014  Dysphagia Diagnosis (None)  Clinical impression Mild oropharyngeal dysphagia, characterized  by excessive secretions orally, but otherwise functional oral  phase. No anterior leakage of any consistency noted.  Pharyngeally,  Vallecular post-swallow residue was noted across  consistencies due to decreased tongue base retraction. Dry  swallow effectively cleared residue. Trace flash penetration of  thin liquids was seen intermittently, during the swallow due to  decreased airway closure, and after the swallow due to vallecular  residue. Pt was able to follow commands to clear throat to remove  penetrate. Puree, cracker, and barium tablet (given with water)  were tolerated without penetration or aspiration, but with trace  vallecular residue.  Dry swallow was effective to reduce  post-swallow residue. Will begin conservative diet of dys 2/thin  liquids with cues to swallow 2x, clear throat intermittently, and  remain upright 30 minutes after eating/drinking. ST to follow for  diet tolerance and education.      CHL IP TREATMENT RECOMMENDATION 05/26/2014  Treatment Plan Recommendations Therapy as outlined in treatment  plan below     CHL IP DIET RECOMMENDATION  05/26/2014  Diet Recommendations Dysphagia 2 (Fine chop);Thin liquid  Liquid Administration via (None)  Medication Administration (None)  Compensations (None)  Postural Changes and/or Swallow Maneuvers (None)     CHL IP OTHER RECOMMENDATIONS 05/26/2014  Recommended Consults (None)  Oral Care Recommendations Oral care Q4 per protocol  Other Recommendations Clarify dietary restrictions     CHL IP FOLLOW UP RECOMMENDATIONS 05/26/2014 Follow up  Recommendations 24 hour supervision/assistance     CHL IP FREQUENCY AND DURATION 05/26/2014  Speech Therapy Frequency (ACUTE ONLY) min 2x/week  Treatment Duration 1 week     Pertinent Vitals/Pain No pain reported    SLP Swallow Goals No flowsheet data found.  No flowsheet data found.    CHL IP REASON FOR REFERRAL 05/26/2014  Reason for Referral Objectively evaluate swallowing function     CHL IP ORAL PHASE 05/26/2014  Lips (None)  Tongue (None)  Mucous membranes (None)  Nutritional status (None)  Other (None)  Oxygen therapy (None)  Oral Phase Impaired   Oral - Pudding Teaspoon (None)  Oral - Pudding Cup (None)  Oral - Honey Teaspoon (None)  Oral - Honey Cup (None)  Oral - Honey Syringe (None)  Oral - Nectar Teaspoon (None)  Oral - Nectar Cup (None)  Oral - Nectar Straw (None)  Oral - Nectar Syringe (None)  Oral - Ice Chips (None)  Oral - Thin Teaspoon (None)  Oral - Thin Cup (None)  Oral - Thin Straw (None)  Oral - Thin Syringe (None)  Oral - Puree (None)  Oral - Mechanical Soft (None)  Oral - Regular (None) Oral - Multi-consistency (None)  Oral - Pill (None)  Oral Phase - Comment Excessive oral secretions, otherwise  functional oral phase      CHL IP PHARYNGEAL PHASE 05/26/2014  Pharyngeal Phase Impaired  Pharyngeal - Pudding Teaspoon (None)  Penetration/Aspiration details (pudding teaspoon) (None)  Pharyngeal - Pudding Cup (None)  Penetration/Aspiration details (pudding cup) (None)  Pharyngeal - Honey Teaspoon (None)  Penetration/Aspiration details (honey teaspoon) (None)  Pharyngeal - Honey Cup (None)  Penetration/Aspiration details (honey cup) (None)  Pharyngeal - Honey Syringe (None)  Penetration/Aspiration details (honey syringe) (None)  Pharyngeal - Nectar Teaspoon (None)  Penetration/Aspiration details (nectar teaspoon) (None)  Pharyngeal - Nectar Cup (None)  Penetration/Aspiration details (nectar cup) (None)  Pharyngeal - Nectar Straw Reduced tongue base  retraction;Pharyngeal residue - valleculae  Penetration/Aspiration details (nectar straw) (None)  Pharyngeal - Nectar Syringe (None)  Penetration/Aspiration details (nectar syringe) (None)  Pharyngeal - Ice Chips (None)  Penetration/Aspiration details (ice chips) (None)  Pharyngeal - Thin Teaspoon (None)  Penetration/Aspiration details (thin teaspoon) (None)  Pharyngeal - Thin Cup (None)  Penetration/Aspiration details (thin cup) (None)  Pharyngeal - Thin Straw Reduced tongue base retraction;Pharyngeal  residue - valleculae;Reduced airway/laryngeal  closure;Penetration/Aspiration after   swallow;Penetration/Aspiration during swallow  Penetration/Aspiration details (thin straw) Material enters  airway, remains ABOVE vocal cords then ejected out;Material does  not enter airway  Pharyngeal - Thin Syringe (None)  Penetration/Aspiration details (thin syringe') (None)  Pharyngeal - Puree Reduced tongue base retraction;Pharyngeal  residue - valleculae  Penetration/Aspiration details (puree) (None)  Pharyngeal - Mechanical Soft (None)  Penetration/Aspiration details (mechanical soft) (None)  Pharyngeal - Regular (None)  Penetration/Aspiration details (regular) (None)  Pharyngeal - Multi-consistency Reduced tongue base  retraction;Pharyngeal residue - valleculae  Penetration/Aspiration details (multi-consistency) (None)  Pharyngeal - Pill Reduced pharyngeal peristalsis;Pharyngeal  residue - valleculae  Penetration/Aspiration details (pill) (None)  Pharyngeal Comment (None)     CHL IP CERVICAL ESOPHAGEAL PHASE 05/26/2014  Cervical Esophageal Phase WFL  Pudding Teaspoon (None)  Pudding Cup (None)  Honey Teaspoon (None)  Honey Cup (None)  Honey Syringe (None)  Nectar Teaspoon (None)  Nectar Cup (None)  Nectar Straw (None)  Nectar Syringe (None)  Thin Teaspoon (None)  Thin Cup (None)  Thin Straw (None)  Thin Syringe (None)  Cervical Esophageal Comment (None)    No flowsheet data found.  Celia B. Quentin Ore [facility], CCC-SLP [phone]       Shonna Chock 05/26/2014, 1:15 PM      EKG Interpretation None      MDM   Final diagnoses:  SOB (shortness of breath)  Aspiration pneumonia, unspecified aspiration pneumonia type    Febrile and tachycardic. Hypoxemic at 86%. Plan is nasal cannula O2.  Given Tylenol. Plan is IV, x-ray, lab evaluation. Reevaluation. He would be high risk for aspiration.  Chest x-ray shows no obvious or marked infiltrate. Limited by positioning and body habitus. No marked leukocytosis. Continues with dyspnea and continues to require 1-2 L nasal cannula.  Clinically, on  recheck he appears much or comfortable. His heart rate improved to 110. Diaphoretic. Recheck temp 100.7. Urine pending. No increased work of  breathing. Remains conversational. States he feels "better". Still requiring 1 to 2 L nasal cannula O2.   Tanna Furry, MD 05/28/14 [phone]

## 2014-05-24 NOTE — H&P (Signed)
[facility] History and Physical  Thomas Ramos NWG:[id number] DOB: 19-Oct-1970 DOA: 05/24/2014  Referring physician: Tanna Furry, MD PCP: Aura Dials, PA-C   Chief Complaint: Fever Pneumonia  HPI: Thomas Ramos is a 44 y.o. male presents with cough and pneumonia. Patient states that he has been having episode of choking and gagging. He states that anytime he eats or drinks he has noted that he chokes. He also states that he has had vomiting since he has been here. He feels as though it goes down his lungs. He has had fevers also noted in the ED. Patient has no shortness of breath noted. He has no chest pain. He has had prior history of pneumonia. In addition he has been having back pain which sounds like it is more chronic. He staets that he does get periodic spasms in his llimbs due to contractures. He has received Botox in the past to relieve spasm. In addition he also has a history of achalasia and has had attempts at stretching his esophagus in the past.   Review of Systems:  Patient ROS was negative other than what is noted in HPI  Past Medical History  Diagnosis Date  . Motility disorder, esophageal   . Esophageal stricture   . Hypertension   . Pneumonia   . GERD (gastroesophageal reflux disease)   . CP (cerebral palsy)   . S/P Botox injection     approx every 4 months  . Seasonal allergies   . Environmental allergies     takes inhalers if needed  . Quadriplegic spinal paralysis   . Cerebral palsy    Past Surgical History  Procedure Laterality Date  . Tendon release    . Mouth surgery    . Esophagus surgery      stretched esophagus  . Legs    . Esophagogastroduodenoscopy N/A 12/21/2012    Procedure: ESOPHAGOGASTRODUODENOSCOPY (EGD);  Surgeon: Inda Castle, MD;  Location: [facility];  Service: Endoscopy;  Laterality: N/A;  . Botox injection N/A 12/21/2012    Procedure: BOTOX INJECTION;  Surgeon: Inda Castle, MD;  Location: [facility];  Service:  Endoscopy;  Laterality: N/A;   Social History:  reports that he has quit smoking. He has quit using smokeless tobacco. He reports that he drinks alcohol. He reports that he does not use illicit drugs.  Allergies  Allergen Reactions  . Penicillins Hives and Nausea And Vomiting    Family History  Problem Relation Age of Onset  . Hypertension Father   . Lung cancer Father   . Diabetes Mother      Prior to Admission medications   Medication Sig Start Date End Date Taking? Authorizing Provider  amLODipine (NORVASC) 5 MG tablet Take 5 mg by mouth every evening.   Yes Historical Provider, MD  cetirizine (ZYRTEC) 10 MG tablet Take 10 mg by mouth daily.  03/01/14  Yes Historical Provider, MD  citalopram (CELEXA) 20 MG tablet Take 20 mg by mouth every evening.   Yes Historical Provider, MD  CRESTOR 5 MG tablet Take 5 mg by mouth at bedtime. 04/22/14  Yes Historical Provider, MD  econazole nitrate 1 % cream Apply 1 application topically daily.  05/16/14 05/16/15 Yes Historical Provider, MD  fluticasone (FLONASE) 50 MCG/ACT nasal spray Place 1 spray into both nostrils daily.  03/01/14  Yes Historical Provider, MD  meloxicam (MOBIC) 7.5 MG tablet Take 7.5 mg by mouth daily.   Yes Historical Provider, MD  montelukast (SINGULAIR) 10 MG tablet Take  10 mg by mouth at bedtime.  03/01/14 03/01/15 Yes Historical Provider, MD  Multiple Vitamin (MULTIVITAMIN WITH MINERALS) TABS tablet Take 1 tablet by mouth daily.   Yes Historical Provider, MD  Omega-3 Fatty Acids (FISH OIL) 1000 MG CAPS Take 1 capsule by mouth daily.   Yes Historical Provider, MD  PROAIR HFA 108 (90 BASE) MCG/ACT inhaler Inhale 2 puffs into the lungs every 6 (six) hours as needed for wheezing or shortness of breath.  12/29/12  Yes Historical Provider, MD  ranitidine (ZANTAC) 150 MG tablet Take 150 mg by mouth at bedtime.  04/30/14 04/30/15 Yes Historical Provider, MD  guaiFENesin (MUCINEX) 600 MG 12 hr tablet Take 600 mg by mouth 2 (two) times  daily.    Historical Provider, MD  hydrochlorothiazide 25 MG tablet Take 25 mg by mouth every evening.     Historical Provider, MD  omeprazole (PRILOSEC) 20 MG capsule Take 20 mg by mouth daily.    Historical Provider, MD  oxyCODONE-acetaminophen (PERCOCET/ROXICET) 5-325 MG per tablet Take 1-2 pills every 4-6 hours as needed for pain. Patient not taking: Reported on 05/24/2014 07/19/13   Noland Fordyce, PA-C  propranolol (INDERAL) 40 MG tablet Take 40 mg by mouth 3 (three) times daily.    Historical Provider, MD  psyllium (REGULOID) 0.52 G capsule Take 0.52 g by mouth daily.    Historical Provider, MD   Physical Exam: Filed Vitals:   05/24/14 2245 05/24/14 2300 05/24/14 2315 05/24/14 2330  BP: 106/58 122/53 114/54 100/62  Pulse: 109 111 117 111  Temp:      TempSrc:      Resp: 37 34 21 19  Height:      Weight:      SpO2: 96% 94% 95% 96%    Wt Readings from Last 3 Encounters:  05/24/14 99.791 kg (220 lb)    General:  Appears calm and comfortable Eyes: PERRL, normal lids, irises & conjunctiva ENT: grossly normal hearing, lips & tongue Neck: no LAD, masses or thyromegaly Cardiovascular: RRR, no m/r/g  Respiratory: CTA bilaterally, no w/r/r. Normal respiratory effort. Abdomen: soft, ntnd Skin: no rash or induration seen on limited exam Musculoskeletal: bilateral upper and lower extremity contractures noted Psychiatric: grossly normal Neurologic: unable to assess          Labs on Admission:  Basic Metabolic Panel:  Recent Labs Lab 05/24/14 2120  NA 140  K 3.8  CL 105  CO2 26  GLUCOSE 119*  BUN 14  CREATININE 0.73  CALCIUM 9.4   Liver Function Tests:  Recent Labs Lab 05/24/14 2120  AST 24  ALT 20  ALKPHOS 109  BILITOT 0.3  PROT 7.3  ALBUMIN 4.2    Recent Labs Lab 05/24/14 2120  LIPASE 24   No results for input(s): AMMONIA in the last 168 hours. CBC:  Recent Labs Lab 05/24/14 2120  WBC 9.9  NEUTROABS 7.2  HGB 14.6  HCT 42.1  MCV 82.9  PLT 232    Cardiac Enzymes: No results for input(s): CKTOTAL, CKMB, CKMBINDEX, TROPONINI in the last 168 hours.  BNP (last 3 results) No results for input(s): BNP in the last 8760 hours.  ProBNP (last 3 results) No results for input(s): PROBNP in the last 8760 hours.  CBG: No results for input(s): GLUCAP in the last 168 hours.  Radiological Exams on Admission: Dg Chest Port 1 View  05/24/2014   CLINICAL DATA:  Shortness of breath for 1 month. Patient is at risk for checking in aspiration. History of  esophageal motility disorder, cerebral palsy, pneumonia, esophageal stricture, quadriplegic spinal paralysis. Nonsmoker.  EXAM: PORTABLE CHEST - 1 VIEW  COMPARISON:  07/19/2013  FINDINGS: Examination is technically limited due to patient positioning and rotation. Suggestion of thoracic kyphosis. Shallow inspiration. Heart size and pulmonary vascularity are normal. No focal airspace disease or consolidation seen in the lungs. No pneumothorax. No blunting of costophrenic angles.  IMPRESSION: Shallow inspiration.  No evidence of active pulmonary disease.   Electronically Signed   By: Lucienne Capers M.D.   On: 05/24/2014 21:40     Assessment/Plan Active Problems:   Aspiration pneumonia   HTN (hypertension)   Fever   1. Aspiration Pneumonia -patient has obvious aspiration by history but no obvious infiltrate on CXR however he is a high risk patient has had witnessed aspiration so may actually benefit from antibiotics -he is PCN allergic will start on clindamycin and meropenem pharmacy consult -will monitor CXR -will need GI consultation as he does have history of UGI issues in the past  2. HTN -will continue with home medications -monitor pressures  3. Fever -blood cultures have been ordered -tylenol for fever -urine cultures also ordered  Code Status: Full Code (must indicate code status--if unknown or must be presumed, indicate so) DVT Prophylaxis:Heparin Family Communication: Caretaker  in room (indicate person spoken with, if applicable, with phone number if by telephone) Disposition Plan: Home (indicate anticipated LOS)  Time spent: 33min  Vonya Ohalloran A [facility] Pager [phone]

## 2014-05-24 NOTE — ED Notes (Signed)
Admitting MD at bedside.

## 2014-05-25 ENCOUNTER — Inpatient Hospital Stay (HOSPITAL_COMMUNITY): Payer: Medicare Other

## 2014-05-25 LAB — CBC
HCT: 36.6 % — ABNORMAL LOW (ref 39.0–52.0)
Hemoglobin: 12.3 g/dL — ABNORMAL LOW (ref 13.0–17.0)
MCH: 28.7 pg (ref 26.0–34.0)
MCHC: 33.6 g/dL (ref 30.0–36.0)
MCV: 85.5 fL (ref 78.0–100.0)
PLATELETS: 163 10*3/uL (ref 150–400)
RBC: 4.28 MIL/uL (ref 4.22–5.81)
RDW: 13.1 % (ref 11.5–15.5)
WBC: 5.2 10*3/uL (ref 4.0–10.5)

## 2014-05-25 LAB — COMPREHENSIVE METABOLIC PANEL
ALBUMIN: 3.5 g/dL (ref 3.5–5.2)
ALK PHOS: 83 U/L (ref 39–117)
ALT: 16 U/L (ref 0–53)
AST: 20 U/L (ref 0–37)
Anion gap: 7 (ref 5–15)
BUN: 15 mg/dL (ref 6–23)
CALCIUM: 8.7 mg/dL (ref 8.4–10.5)
CO2: 27 mmol/L (ref 19–32)
Chloride: 108 mmol/L (ref 96–112)
Creatinine, Ser: 0.68 mg/dL (ref 0.50–1.35)
GFR calc non Af Amer: >90 mL/min (ref >90)
Glucose, Bld: 95 mg/dL (ref 70–99)
Potassium: 3.9 mmol/L (ref 3.5–5.1)
SODIUM: 142 mmol/L (ref 135–145)
TOTAL PROTEIN: 6 g/dL (ref 6.0–8.3)
Total Bilirubin: 0.5 mg/dL (ref 0.3–1.2)

## 2014-05-25 LAB — GLUCOSE, CAPILLARY: GLUCOSE-CAPILLARY: 96 mg/dL (ref 70–99)

## 2014-05-25 LAB — TSH: TSH: 0.961 u[IU]/mL (ref 0.350–4.500)

## 2014-05-25 LAB — MRSA PCR SCREENING: MRSA by PCR: NEGATIVE

## 2014-05-25 MED ORDER — CITALOPRAM HYDROBROMIDE 20 MG PO TABS
20.0000 mg | ORAL_TABLET | Freq: Every evening | ORAL | Status: DC
  Administered 2014-05-26 – 2014-05-28 (×3): 20 mg via ORAL
  Filled 2014-05-25 (×4): qty 1

## 2014-05-25 MED ORDER — ECONAZOLE NITRATE 1 % EX CREA: 1.0000 "application " | TOPICAL_CREAM | Freq: Every day | CUTANEOUS | Status: DC

## 2014-05-25 MED ORDER — ONDANSETRON HCL 4 MG/2ML IJ SOLN
4.0000 mg | Freq: Four times a day (QID) | INTRAMUSCULAR | Status: DC | PRN
  Administered 2014-05-25 – 2014-05-26 (×3): 4 mg via INTRAVENOUS
  Filled 2014-05-25 (×3): qty 2

## 2014-05-25 MED ORDER — FAMOTIDINE 20 MG PO TABS
20.0000 mg | ORAL_TABLET | Freq: Every day | ORAL | Status: DC
  Administered 2014-05-26 – 2014-05-27 (×2): 20 mg via ORAL
  Filled 2014-05-25 (×5): qty 1

## 2014-05-25 MED ORDER — HEPARIN SODIUM (PORCINE) 5000 UNIT/ML IJ SOLN
5000.0000 [IU] | Freq: Three times a day (TID) | INTRAMUSCULAR | Status: DC
  Administered 2014-05-25 – 2014-05-27 (×7): 5000 [IU] via SUBCUTANEOUS
  Filled 2014-05-25 (×10): qty 1

## 2014-05-25 MED ORDER — ADULT MULTIVITAMIN W/MINERALS CH
1.0000 | ORAL_TABLET | Freq: Every day | ORAL | Status: DC
  Administered 2014-05-26 – 2014-05-28 (×2): 1 via ORAL
  Filled 2014-05-25 (×4): qty 1

## 2014-05-25 MED ORDER — ASPIRIN EC 325 MG PO TBEC
325.0000 mg | DELAYED_RELEASE_TABLET | Freq: Every day | ORAL | Status: DC
  Administered 2014-05-26: 325 mg via ORAL
  Filled 2014-05-25 (×3): qty 1

## 2014-05-25 MED ORDER — ACETAMINOPHEN 325 MG PO TABS
650.0000 mg | ORAL_TABLET | Freq: Four times a day (QID) | ORAL | Status: DC | PRN
  Filled 2014-05-25: qty 2

## 2014-05-25 MED ORDER — SODIUM CHLORIDE 0.9 % IV SOLN
1.0000 g | Freq: Once | INTRAVENOUS | Status: AC
  Administered 2014-05-25: 1 g via INTRAVENOUS
  Filled 2014-05-25: qty 1

## 2014-05-25 MED ORDER — AMLODIPINE BESYLATE 5 MG PO TABS
5.0000 mg | ORAL_TABLET | Freq: Every evening | ORAL | Status: DC
  Administered 2014-05-26 – 2014-05-28 (×3): 5 mg via ORAL
  Filled 2014-05-25 (×4): qty 1

## 2014-05-25 MED ORDER — MELOXICAM 7.5 MG PO TABS
7.5000 mg | ORAL_TABLET | Freq: Every day | ORAL | Status: DC
  Administered 2014-05-26: 7.5 mg via ORAL
  Filled 2014-05-25 (×3): qty 1

## 2014-05-25 MED ORDER — MONTELUKAST SODIUM 10 MG PO TABS
10.0000 mg | ORAL_TABLET | Freq: Every day | ORAL | Status: DC
  Administered 2014-05-26 – 2014-05-27 (×2): 10 mg via ORAL
  Filled 2014-05-25 (×5): qty 1

## 2014-05-25 MED ORDER — CETYLPYRIDINIUM CHLORIDE 0.05 % MT LIQD
7.0000 mL | Freq: Two times a day (BID) | OROMUCOSAL | Status: DC
  Administered 2014-05-25 – 2014-05-27 (×5): 7 mL via OROMUCOSAL

## 2014-05-25 MED ORDER — KETOCONAZOLE 2 % EX CREA
TOPICAL_CREAM | Freq: Every day | CUTANEOUS | Status: DC
  Administered 2014-05-25 – 2014-05-28 (×4): via TOPICAL
  Filled 2014-05-25: qty 15

## 2014-05-25 MED ORDER — BISACODYL 10 MG RE SUPP: 10.0000 mg | Freq: Every day | RECTAL | Status: DC | PRN

## 2014-05-25 MED ORDER — ONDANSETRON HCL 4 MG PO TABS
4.0000 mg | ORAL_TABLET | Freq: Four times a day (QID) | ORAL | Status: DC | PRN
Start: 2014-05-25 — End: 2014-05-28

## 2014-05-25 MED ORDER — SODIUM CHLORIDE 0.9 % IV SOLN
INTRAVENOUS | Status: DC
  Administered 2014-05-25 – 2014-05-26 (×2): via INTRAVENOUS

## 2014-05-25 MED ORDER — ACETAMINOPHEN 650 MG RE SUPP
650.0000 mg | Freq: Four times a day (QID) | RECTAL | Status: DC | PRN
  Administered 2014-05-25 (×2): 650 mg via RECTAL
  Filled 2014-05-25 (×2): qty 1

## 2014-05-25 MED ORDER — FLUTICASONE PROPIONATE 50 MCG/ACT NA SUSP
1.0000 | Freq: Every day | NASAL | Status: DC
  Administered 2014-05-26 – 2014-05-28 (×3): 1 via NASAL
  Filled 2014-05-25: qty 16

## 2014-05-25 MED ORDER — SODIUM CHLORIDE 0.9 % IV SOLN
1.0000 g | Freq: Three times a day (TID) | INTRAVENOUS | Status: DC
  Administered 2014-05-25: 1 g via INTRAVENOUS
  Filled 2014-05-25 (×2): qty 1

## 2014-05-25 MED ORDER — OSELTAMIVIR PHOSPHATE 75 MG PO CAPS
75.0000 mg | ORAL_CAPSULE | Freq: Two times a day (BID) | ORAL | Status: DC
Start: 2014-05-25 — End: 2014-05-28
  Administered 2014-05-26 – 2014-05-28 (×5): 75 mg via ORAL
  Filled 2014-05-25 (×8): qty 1

## 2014-05-25 MED ORDER — CLINDAMYCIN PHOSPHATE 600 MG/50ML IV SOLN
600.0000 mg | Freq: Four times a day (QID) | INTRAVENOUS | Status: DC
  Administered 2014-05-25: 600 mg via INTRAVENOUS
  Filled 2014-05-25 (×3): qty 50

## 2014-05-25 MED ORDER — OXYCODONE HCL 5 MG PO TABS: 5.0000 mg | ORAL_TABLET | ORAL | Status: DC | PRN

## 2014-05-25 MED ORDER — ROSUVASTATIN CALCIUM 5 MG PO TABS
5.0000 mg | ORAL_TABLET | Freq: Every day | ORAL | Status: DC
  Administered 2014-05-26 – 2014-05-27 (×2): 5 mg via ORAL
  Filled 2014-05-25 (×6): qty 1

## 2014-05-25 MED ORDER — IPRATROPIUM BROMIDE 0.02 % IN SOLN
0.5000 mg | Freq: Four times a day (QID) | RESPIRATORY_TRACT | Status: DC
  Administered 2014-05-25 – 2014-05-26 (×4): 0.5 mg via RESPIRATORY_TRACT
  Filled 2014-05-25 (×5): qty 2.5

## 2014-05-25 MED ORDER — VITAMIN B-1 100 MG PO TABS
100.0000 mg | ORAL_TABLET | Freq: Every day | ORAL | Status: DC
  Administered 2014-05-26 – 2014-05-28 (×2): 100 mg via ORAL
  Filled 2014-05-25 (×4): qty 1

## 2014-05-25 MED ORDER — OMEGA-3-ACID ETHYL ESTERS 1 G PO CAPS
1.0000 g | ORAL_CAPSULE | Freq: Every day | ORAL | Status: DC
  Administered 2014-05-26 – 2014-05-28 (×2): 1 g via ORAL
  Filled 2014-05-25 (×4): qty 1

## 2014-05-25 MED ORDER — DOCUSATE SODIUM 100 MG PO CAPS
100.0000 mg | ORAL_CAPSULE | Freq: Two times a day (BID) | ORAL | Status: DC
  Administered 2014-05-26 – 2014-05-28 (×4): 100 mg via ORAL
  Filled 2014-05-25 (×9): qty 1

## 2014-05-25 MED ORDER — FOLIC ACID 1 MG PO TABS
1.0000 mg | ORAL_TABLET | Freq: Every day | ORAL | Status: DC
  Administered 2014-05-26 – 2014-05-28 (×2): 1 mg via ORAL
  Filled 2014-05-25 (×4): qty 1

## 2014-05-25 NOTE — ED Notes (Signed)
Attempt to call report.  RN to call back.

## 2014-05-25 NOTE — Progress Notes (Signed)
Received pt report from Monique,RN-ED. 

## 2014-05-25 NOTE — Progress Notes (Signed)
ANTIBIOTIC CONSULT NOTE - INITIAL  Pharmacy Consult for Merrem Indication: rule out pneumonia  Allergies  Allergen Reactions  . Penicillins Hives and Nausea And Vomiting    Patient Measurements: Height: 5' (152.4 cm) Weight: 220 lb (99.791 kg) IBW/kg (Calculated) : 50  Vital Signs: Temp: 97.3 F (36.3 C) (03/11 0043) Temp Source: Oral (03/11 0043) BP: 121/90 mmHg (03/11 0030) Pulse Rate: 112 (03/11 0000)  Labs:  Recent Labs  05/24/14 2120  WBC 9.9  HGB 14.6  PLT 232  CREATININE 0.73   Estimated Creatinine Clearance: 120.1 mL/min (by C-G formula based on Cr of 0.73).  Medical History: Past Medical History  Diagnosis Date  . Motility disorder, esophageal   . Esophageal stricture   . Hypertension   . Pneumonia   . GERD (gastroesophageal reflux disease)   . CP (cerebral palsy)   . S/P Botox injection     approx every 4 months  . Seasonal allergies   . Environmental allergies     takes inhalers if needed  . Quadriplegic spinal paralysis   . Cerebral palsy     Medications:  Prescriptions prior to admission  Medication Sig Dispense Refill Last Dose  . amLODipine (NORVASC) 5 MG tablet Take 5 mg by mouth every evening.   05/24/2014 at Unknown time  . cetirizine (ZYRTEC) 10 MG tablet Take 10 mg by mouth daily.    05/24/2014 at Unknown time  . citalopram (CELEXA) 20 MG tablet Take 20 mg by mouth every evening.   05/23/2014 at Unknown time  . CRESTOR 5 MG tablet Take 5 mg by mouth at bedtime.  5 05/23/2014 at Unknown time  . econazole nitrate 1 % cream Apply 1 application topically daily.    05/24/2014 at Unknown time  . fluticasone (FLONASE) 50 MCG/ACT nasal spray Place 1 spray into both nostrils daily.    05/24/2014 at Unknown time  . meloxicam (MOBIC) 7.5 MG tablet Take 7.5 mg by mouth daily.   05/24/2014 at Unknown time  . montelukast (SINGULAIR) 10 MG tablet Take 10 mg by mouth at bedtime.    05/23/2014 at Unknown time  . Multiple Vitamin (MULTIVITAMIN WITH MINERALS) TABS  tablet Take 1 tablet by mouth daily.   05/24/2014 at Unknown time  . Omega-3 Fatty Acids (FISH OIL) 1000 MG CAPS Take 1 capsule by mouth daily.   05/24/2014 at Unknown time  . PROAIR HFA 108 (90 BASE) MCG/ACT inhaler Inhale 2 puffs into the lungs every 6 (six) hours as needed for wheezing or shortness of breath.    05/24/2014 at Unknown time  . ranitidine (ZANTAC) 150 MG tablet Take 150 mg by mouth at bedtime.    05/23/2014 at Unknown time  . guaiFENesin (MUCINEX) 600 MG 12 hr tablet Take 600 mg by mouth 2 (two) times daily.   Not Taking at Unknown time  . hydrochlorothiazide 25 MG tablet Take 25 mg by mouth every evening.    Not Taking at Unknown time  . omeprazole (PRILOSEC) 20 MG capsule Take 20 mg by mouth daily.   Not Taking at Unknown time  . oxyCODONE-acetaminophen (PERCOCET/ROXICET) 5-325 MG per tablet Take 1-2 pills every 4-6 hours as needed for pain. (Patient not taking: Reported on 05/24/2014) 20 tablet 0 Not Taking at Unknown time  . propranolol (INDERAL) 40 MG tablet Take 40 mg by mouth 3 (three) times daily.   Not Taking at Unknown time  . psyllium (REGULOID) 0.52 G capsule Take 0.52 g by mouth daily.   Not Taking  at Unknown time   Scheduled:  . amLODipine  5 mg Oral QPM  . aspirin EC  325 mg Oral Daily  . citalopram  20 mg Oral QPM  . clindamycin (CLEOCIN) IV  600 mg Intravenous 4 times per day  . docusate sodium  100 mg Oral BID  . famotidine  20 mg Oral QHS  . fluticasone  1 spray Each Nare Daily  . folic acid  1 mg Oral Daily  . heparin  5,000 Units Subcutaneous 3 times per day  . ipratropium  0.5 mg Nebulization Q6H  . ketoconazole   Topical Daily  . meloxicam  7.5 mg Oral Daily  . meropenem (MERREM) IV  1 g Intravenous Once  . meropenem (MERREM) IV  1 g Intravenous Q8H  . montelukast  10 mg Oral QHS  . multivitamin with minerals  1 tablet Oral Daily  . omega-3 acid ethyl esters  1 g Oral Daily  . rosuvastatin  5 mg Oral QHS  . thiamine  100 mg Oral Daily   Infusions:  .  sodium chloride      Assessment: 44yo male c/o back/leg pain w/ N/V and choking/gagging w/ eating, known h/o esophageal stricture and motility d/o, concern for PNA, to begin IV ABX.   Plan:  Will begin Merrem 1g IV Q8H and monitor CBC and Cx.  Wynona Neat, PharmD, BCPS  05/25/2014,1:32 AM

## 2014-05-25 NOTE — Progress Notes (Signed)
Attempted to get report, RN unavailable at this time. Left number.

## 2014-05-25 NOTE — Progress Notes (Signed)
Thomas Ramos [id number] Admission Data: 05/25/2014 2:09 AM Attending Provider: Allyne Gee, MD UKG:[id number],CBJS C, PA-C Code Status: Full  Thomas Ramos is a 44 y.o. male patient admitted from ED:  -No acute distress noted.  -No complaints of shortness of breath.  -No complaints of chest pain.   Cardiac Monitoring: Box # n/a in place. Cardiac monitor yields:n/a.  Blood pressure 123/72, pulse 104, temperature 99.8 F (37.7 C), temperature source Oral, resp. rate 18, height 4\' 10"  (1.473 m), weight 73.211 kg (161 lb 6.4 oz), SpO2 94 %.   IV Fluids:  IV in place, occlusive dsg intact without redness, IV cath L hand normal saline.   Allergies:  Penicillins  Past Medical History:   has a past medical history of Motility disorder, esophageal; Esophageal stricture; Hypertension; Pneumonia; GERD (gastroesophageal reflux disease); CP (cerebral palsy); S/P Botox injection; Seasonal allergies; Environmental allergies; Quadriplegic spinal paralysis; and Cerebral palsy.  Past Surgical History:   has past surgical history that includes Tendon release; Mouth surgery; Esophagus surgery; legs; Esophagogastroduodenoscopy (N/A, 12/21/2012); and Botox injection (N/A, 12/21/2012).  Social History:   reports that he has quit smoking. He has quit using smokeless tobacco. He reports that he drinks alcohol. He reports that he does not use illicit drugs.  Skin: charted on CHL  Patient/Family orientated to room. Information packet given to patient/family. Admission inpatient armband information verified with patient/family to include name and date of birth and placed on patient arm. Side rails up x 2, fall assessment and education completed with patient/family. Patient/family able to verbalize understanding of risk associated with falls and verbalized understanding to call for assistance before getting out of bed. Call light within reach. Patient/family able to voice and demonstrate understanding of unit  orientation instructions.

## 2014-05-25 NOTE — Evaluation (Signed)
Clinical/Bedside Swallow Evaluation Patient Details  Name: Thomas Ramos MRN: [id number] Date of Birth: 13-Feb-1971  Today's Date: 05/25/2014 Time: SLP Start Time (ACUTE ONLY): 0935 SLP Stop Time (ACUTE ONLY): 1002 SLP Time Calculation (min) (ACUTE ONLY): 27 min  Past Medical History:  Past Medical History  Diagnosis Date  . Motility disorder, esophageal   . Esophageal stricture   . Hypertension   . Pneumonia   . GERD (gastroesophageal reflux disease)   . CP (cerebral palsy)   . S/P Botox injection     approx every 4 months  . Seasonal allergies   . Environmental allergies     takes inhalers if needed  . Quadriplegic spinal paralysis   . Cerebral palsy    Past Surgical History:  Past Surgical History  Procedure Laterality Date  . Tendon release    . Mouth surgery    . Esophagus surgery      stretched esophagus  . Legs    . Esophagogastroduodenoscopy N/A 12/21/2012    Procedure: ESOPHAGOGASTRODUODENOSCOPY (EGD);  Surgeon: Inda Castle, MD;  Location: [facility];  Service: Endoscopy;  Laterality: N/A;  . Botox injection N/A 12/21/2012    Procedure: BOTOX INJECTION;  Surgeon: Inda Castle, MD;  Location: [facility];  Service: Endoscopy;  Laterality: N/A;   HPI:  Thomas Ramos is a 44 year old male who presents with cough and pneumonia. Patient states that he has been having episode of choking and gagging. He states that anytime he eats or drinks he has noted that he chokes and that he has reportedly had vomiting. He feels as though it goes down his lungs. He has had fevers also noted in the ED. In addition he also has a history of achalasia and has had attempts at stretching his esophagus in the past.   Assessment / Plan / Recommendation Clinical Impression  Orders received; bedside swallow evaluation completed.  Patient presents with mildly decreased labial and lingual strength as well as suspected delayed swallow initiation.  These impairments result in overt s/s of  aspiration indicating airway compromise with thin and pureed consistencies.  At this time SLP is unable to determine if aspiration is a result of pharyngeal or esophageal deficits, given patient's history; as a result, recommend an objective swallow assessment to determine cause of aspiration.      Aspiration Risk  Severe    Diet Recommendation NPO   Medication Administration: Via alternative means    Other  Recommendations Oral Care Recommendations: Oral care Q4 per protocol   Follow Up Recommendations  Other (comment) (TBD)            Pertinent Vitals/Pain Temperature 101.7, SOB throughout evaluation so trials were spaced out.    SLP Swallow Goals  defer until after objective assessment   Swallow Study Prior Functional Status       General HPI: Thomas Ramos is a 44 year old male who presents with cough and pneumonia. Patient states that he has been having episode of choking and gagging. He states that anytime he eats or drinks he has noted that he chokes and that he has reportedly had vomiting. He feels as though it goes down his lungs. He has had fevers also noted in the ED. In addition he also has a history of achalasia and has had attempts at stretching his esophagus in the past. Type of Study: Bedside swallow evaluation Previous Swallow Assessment: none on record Diet Prior to this Study: NPO Temperature Spikes Noted: Yes  Respiratory Status: Nasal cannula History of Recent Intubation: No Behavior/Cognition: Alert;Cooperative;Pleasant mood Oral Cavity - Dentition: Adequate natural dentition Self-Feeding Abilities: Needs assist;Needs set up;Able to feed self with adaptive devices Patient Positioning: Partially reclined Baseline Vocal Quality: Other (comment);Wet (strained, SOB) Volitional Cough: Strong;Congested Volitional Swallow: Able to elicit    Oral/Motor/Sensory Function Overall Oral Motor/Sensory Function: Impaired Labial ROM: Within Functional Limits Labial  Symmetry: Within Functional Limits Labial Strength: Reduced Labial Sensation: Within Functional Limits Lingual ROM: Reduced right;Reduced left Lingual Symmetry: Within Functional Limits Lingual Strength: Reduced Lingual Sensation: Within Functional Limits Facial ROM: Within Functional Limits Facial Symmetry: Within Functional Limits Facial Strength: Within Functional Limits Facial Sensation: Within Functional Limits Velum: Within Functional Limits Mandible: Within Functional Limits   Ice Chips Ice chips: Impaired Presentation: Spoon Pharyngeal Phase Impairments: Suspected delayed Swallow   Thin Liquid Thin Liquid: Impaired Presentation: Straw Pharyngeal  Phase Impairments: Suspected delayed Swallow;Multiple swallows;Cough - Delayed    Nectar Thick Nectar Thick Liquid: Not tested   Honey Thick Honey Thick Liquid: Not tested   Puree Puree: Impaired Presentation: Spoon Pharyngeal Phase Impairments: Suspected delayed Swallow;Cough - Delayed   Solid   GO    Solid: Not tested      Gunnar Fusi, M.A., CCC-SLP [phone]  Katora Fini 05/25/2014,10:22 AM

## 2014-05-25 NOTE — Progress Notes (Signed)
UR completed.  Kirkland Figg, RN BSN MHA CCM Trauma/Neuro ICU Case Manager [phone]  

## 2014-05-25 NOTE — Progress Notes (Signed)
Patient Demographics  Thomas Ramos, is a 44 y.o. male, DOB - 1970-08-24, VAN:[id number]  Admit date - 05/24/2014   Admitting Physician Allyne Gee, MD  Outpatient Primary MD for the patient is Aura Dials, PA-C  LOS - 1   Chief Complaint  Patient presents with  . Back Pain  . Leg Pain        Subjective:   Denise Bramblett today has, No headache, No chest pain, No abdominal pain - No Nausea, No new weakness tingling or numbness, No Cough - SOB.    Assessment & Plan    1. Fever, body aches. History of nausea vomiting yesterday. Suspicious for viral illness, rule out flu, place on Tamiflu, hold antibiotics. Currently symptom-free feeling better. Will monitor clinically.    2. History of cerebral palsy. Lives in group home. Supportive care.    3.H/O Dysphagia and Achalasia -  Speech following, B swallow today, diet per speech.    4.Dyslipidemia - on statin.    5.HTN - continue Norvasc.    6.GERD - on Pepcid.       Code Status: Full  Family Communication:  none  Disposition Plan: Group Home   Procedures   B Swallow   Consults  Speech   Medications  Scheduled Meds: . amLODipine  5 mg Oral QPM  . aspirin EC  325 mg Oral Daily  . citalopram  20 mg Oral QPM  . docusate sodium  100 mg Oral BID  . famotidine  20 mg Oral QHS  . fluticasone  1 spray Each Nare Daily  . folic acid  1 mg Oral Daily  . heparin  5,000 Units Subcutaneous 3 times per day  . ipratropium  0.5 mg Nebulization Q6H  . ketoconazole   Topical Daily  . meloxicam  7.5 mg Oral Daily  . meropenem (MERREM) IV  1 g Intravenous Q8H  . montelukast  10 mg Oral QHS  . multivitamin with minerals  1 tablet Oral Daily  . omega-3 acid ethyl esters  1 g Oral Daily  . oseltamivir  75 mg Oral BID  .  rosuvastatin  5 mg Oral QHS  . thiamine  100 mg Oral Daily   Continuous Infusions: . sodium chloride 50 mL/hr at 05/25/14 0249   PRN Meds:.acetaminophen **OR** acetaminophen, bisacodyl, ondansetron **OR** ondansetron (ZOFRAN) IV, oxyCODONE  DVT Prophylaxis   Heparin   Lab Results  Component Value Date   PLT 163 05/25/2014    Antibiotics     Anti-infectives    Start     Dose/Rate Route Frequency Ordered Stop   05/25/14 1030  oseltamivir (TAMIFLU) capsule 75 mg     75 mg Oral 2 times daily 05/25/14 1020 05/30/14 0959   05/25/14 0800  meropenem (MERREM) 1 g in sodium chloride 0.9 % 100 mL IVPB     1 g 200 mL/hr over 30 Minutes Intravenous Every 8 hours 05/25/14 0130     05/25/14 0600  clindamycin (CLEOCIN) IVPB 600 mg  Status:  Discontinued     600 mg 100 mL/hr over 30 Minutes Intravenous 4 times per day 05/25/14 0120 05/25/14 1020   05/25/14 0145  meropenem (MERREM) 1 g in sodium chloride 0.9 % 100 mL IVPB  1 g 200 mL/hr over 30 Minutes Intravenous  Once 05/25/14 0130 05/25/14 0402   05/24/14 2345  clindamycin (CLEOCIN) IVPB 900 mg     900 mg 100 mL/hr over 30 Minutes Intravenous  Once 05/24/14 2332 05/25/14 0041          Objective:   Filed Vitals:   05/25/14 0207 05/25/14 0447 05/25/14 0538 05/25/14 1013  BP:   105/59   Pulse:   83   Temp:  99 F (37.2 C) 99.5 F (37.5 C)   TempSrc:   Oral   Resp:   18   Height:      Weight:      SpO2: 96%  98% 96%    Wt Readings from Last 3 Encounters:  05/25/14 73.211 kg (161 lb 6.4 oz)     Intake/Output Summary (Last 24 hours) at 05/25/14 1021 Last data filed at 05/25/14 0601  Gross per 24 hour  Intake 304.17 ml  Output      0 ml  Net 304.17 ml     Physical Exam  Awake Alert, Oriented X 3, No new F.N deficits, Normal affect [place].AT,PERRAL Supple Neck,No JVD, No cervical lymphadenopathy appriciated.  Symmetrical Chest wall movement, Good air movement bilaterally, CTAB RRR,No Gallops,Rubs or new Murmurs, No  Parasternal Heave +ve B.Sounds, Abd Soft, No tenderness, No organomegaly appriciated, No rebound - guarding or rigidity. No Cyanosis, Clubbing or edema, No new Rash or bruise      Data Review   Micro Results Recent Results (from the past 240 hour(s))  MRSA PCR Screening     Status: None   Collection Time: 05/25/14  2:05 AM  Result Value Ref Range Status   MRSA by PCR NEGATIVE NEGATIVE Final    Comment:        The GeneXpert MRSA Assay (FDA approved for NASAL specimens only), is one component of a comprehensive MRSA colonization surveillance program. It is not intended to diagnose MRSA infection nor to guide or monitor treatment for MRSA infections.     Radiology Reports Dg Chest Port 1 View  05/24/2014   CLINICAL DATA:  Shortness of breath for 1 month. Patient is at risk for checking in aspiration. History of esophageal motility disorder, cerebral palsy, pneumonia, esophageal stricture, quadriplegic spinal paralysis. Nonsmoker.  EXAM: PORTABLE CHEST - 1 VIEW  COMPARISON:  07/19/2013  FINDINGS: Examination is technically limited due to patient positioning and rotation. Suggestion of thoracic kyphosis. Shallow inspiration. Heart size and pulmonary vascularity are normal. No focal airspace disease or consolidation seen in the lungs. No pneumothorax. No blunting of costophrenic angles.  IMPRESSION: Shallow inspiration.  No evidence of active pulmonary disease.   Electronically Signed   By: Lucienne Capers M.D.   On: 05/24/2014 21:40     CBC  Recent Labs Lab 05/24/14 2120 05/25/14 0528  WBC 9.9 5.2  HGB 14.6 12.3*  HCT 42.1 36.6*  PLT 232 163  MCV 82.9 85.5  MCH 28.7 28.7  MCHC 34.7 33.6  RDW 12.7 13.1  LYMPHSABS 1.4  --   MONOABS 1.1*  --   EOSABS 0.1  --   BASOSABS 0.0  --     Chemistries   Recent Labs Lab 05/24/14 2120 05/25/14 0528  NA 140 142  K 3.8 3.9  CL 105 108  CO2 26 27  GLUCOSE 119* 95  BUN 14 15  CREATININE 0.73 0.68  CALCIUM 9.4 8.7  AST 24 20   ALT 20 16  ALKPHOS 109 83  BILITOT  0.3 0.5   ------------------------------------------------------------------------------------------------------------------ estimated creatinine clearance is 97.1 mL/min (by C-G formula based on Cr of 0.68). ------------------------------------------------------------------------------------------------------------------ No results for input(s): HGBA1C in the last 72 hours. ------------------------------------------------------------------------------------------------------------------ No results for input(s): CHOL, HDL, LDLCALC, TRIG, CHOLHDL, LDLDIRECT in the last 72 hours. ------------------------------------------------------------------------------------------------------------------  Recent Labs  05/25/14 0528  TSH 0.961   ------------------------------------------------------------------------------------------------------------------ No results for input(s): VITAMINB12, FOLATE, FERRITIN, TIBC, IRON, RETICCTPCT in the last 72 hours.  Coagulation profile No results for input(s): INR, PROTIME in the last 168 hours.  No results for input(s): DDIMER in the last 72 hours.  Cardiac Enzymes No results for input(s): CKMB, TROPONINI, MYOGLOBIN in the last 168 hours.  Invalid input(s): CK ------------------------------------------------------------------------------------------------------------------ Invalid input(s): POCBNP     Time Spent in minutes   35   Lala Lund K M.D on 05/25/2014 at 10:21 AM  Between 7am to 7pm - Pager - [phone]  After 7pm go to [email or web address] - password [facility]  [facility]   Office  [phone]

## 2014-05-26 ENCOUNTER — Inpatient Hospital Stay (HOSPITAL_COMMUNITY): Payer: Medicare Other

## 2014-05-26 DIAGNOSIS — R1314 Dysphagia, pharyngoesophageal phase: Secondary | ICD-10-CM

## 2014-05-26 LAB — INFLUENZA PANEL BY PCR (TYPE A & B)
H1N1 flu by pcr: DETECTED — AB
INFLBPCR: NEGATIVE
Influenza A By PCR: POSITIVE — AB

## 2014-05-26 LAB — HEMOGLOBIN A1C
HEMOGLOBIN A1C: 6.2 % — AB (ref 4.8–5.6)
MEAN PLASMA GLUCOSE: 131 mg/dL

## 2014-05-26 LAB — CBC
HCT: 37 % — ABNORMAL LOW (ref 39.0–52.0)
Hemoglobin: 11.8 g/dL — ABNORMAL LOW (ref 13.0–17.0)
MCH: 28 pg (ref 26.0–34.0)
MCHC: 31.9 g/dL (ref 30.0–36.0)
MCV: 87.9 fL (ref 78.0–100.0)
Platelets: 129 10*3/uL — ABNORMAL LOW (ref 150–400)
RBC: 4.21 MIL/uL — ABNORMAL LOW (ref 4.22–5.81)
RDW: 13.3 % (ref 11.5–15.5)
WBC: 3.1 10*3/uL — ABNORMAL LOW (ref 4.0–10.5)

## 2014-05-26 LAB — URINALYSIS, ROUTINE W REFLEX MICROSCOPIC
Glucose, UA: NEGATIVE mg/dL
HGB URINE DIPSTICK: NEGATIVE
LEUKOCYTES UA: NEGATIVE
NITRITE: NEGATIVE
PH: 6 (ref 5.0–8.0)
PROTEIN: 30 mg/dL — AB
Specific Gravity, Urine: 1.035 — ABNORMAL HIGH (ref 1.005–1.030)
UROBILINOGEN UA: 1 mg/dL (ref 0.0–1.0)

## 2014-05-26 LAB — BASIC METABOLIC PANEL
ANION GAP: 8 (ref 5–15)
BUN: 12 mg/dL (ref 6–23)
CALCIUM: 8.5 mg/dL (ref 8.4–10.5)
CO2: 25 mmol/L (ref 19–32)
CREATININE: 0.63 mg/dL (ref 0.50–1.35)
Chloride: 108 mmol/L (ref 96–112)
GFR calc Af Amer: >90 mL/min (ref >90)
Glucose, Bld: 89 mg/dL (ref 70–99)
Potassium: 3.7 mmol/L (ref 3.5–5.1)
Sodium: 141 mmol/L (ref 135–145)

## 2014-05-26 LAB — GLUCOSE, CAPILLARY
GLUCOSE-CAPILLARY: 63 mg/dL — AB (ref 70–99)
Glucose-Capillary: 109 mg/dL — ABNORMAL HIGH (ref 70–99)
Glucose-Capillary: 190 mg/dL — ABNORMAL HIGH (ref 70–99)
Glucose-Capillary: 106 mg/dL — ABNORMAL HIGH (ref 70–99)
Glucose-Capillary: 101 mg/dL — ABNORMAL HIGH (ref 70–99)

## 2014-05-26 LAB — URINE CULTURE
Colony Count: 30000
Special Requests: NORMAL

## 2014-05-26 LAB — URINE MICROSCOPIC-ADD ON

## 2014-05-26 MED ORDER — VANCOMYCIN HCL IN DEXTROSE 1-5 GM/200ML-% IV SOLN
1000.0000 mg | Freq: Two times a day (BID) | INTRAVENOUS | Status: DC
  Administered 2014-05-26 – 2014-05-28 (×5): 1000 mg via INTRAVENOUS
  Filled 2014-05-26 (×6): qty 200

## 2014-05-26 MED ORDER — DEXTROSE 50 % IV SOLN
INTRAVENOUS | Status: AC
  Filled 2014-05-26: qty 50

## 2014-05-26 MED ORDER — IPRATROPIUM-ALBUTEROL 0.5-2.5 (3) MG/3ML IN SOLN
3.0000 mL | RESPIRATORY_TRACT | Status: DC | PRN
  Administered 2014-05-26: 3 mL via RESPIRATORY_TRACT
  Filled 2014-05-26: qty 3

## 2014-05-26 MED ORDER — MEROPENEM 1 G IV SOLR
1.0000 g | Freq: Three times a day (TID) | INTRAVENOUS | Status: DC
  Administered 2014-05-26 – 2014-05-28 (×7): 1 g via INTRAVENOUS
  Filled 2014-05-26 (×9): qty 1

## 2014-05-26 MED ORDER — DEXTROSE 50 % IV SOLN
1.0000 | Freq: Once | INTRAVENOUS | Status: AC
  Administered 2014-05-26: 50 mL via INTRAVENOUS

## 2014-05-26 MED ORDER — SODIUM CHLORIDE 0.9 % IV SOLN: INTRAVENOUS | Status: DC

## 2014-05-26 NOTE — Progress Notes (Signed)
Patient Demographics  Thomas Ramos, is a 44 y.o. male, DOB - 07/05/1970, ZHG:[id number]  Admit date - 05/24/2014   Admitting Physician Allyne Gee, MD  Outpatient Primary MD for the patient is Aura Dials, PA-C  LOS - 2   Chief Complaint  Patient presents with  . Back Pain  . Leg Pain        Subjective:   Thomas Ramos today has, No headache, No chest pain, No abdominal pain - No Nausea, No new weakness tingling or numbness, No Cough - SOB.    Assessment & Plan    1. Fever, body aches. History of nausea vomiting yesterday. Suspicious for viral illness, pending influenza panel, placed on Tamiflu, hold antibiotics. Currently symptom-free feeling better. Will monitor clinically.    2. History of cerebral palsy. Lives in group home. Supportive care.    3.H/O Dysphagia and Achalasia -  Speech following, B swallow today, currently nothing by mouth except for Tamiflu with assistance and aspiration precautions.    4.Dyslipidemia - on statin once taking by mouth.    5.HTN - continue Norvasc once taking by mouth.    6.GERD - on Pepcid once taking by mouth.       Code Status: Full  Family Communication:  none  Disposition Plan: Group Home   Procedures   B Swallow   Consults  Speech   Medications  Scheduled Meds: . amLODipine  5 mg Oral QPM  . antiseptic oral rinse  7 mL Mouth Rinse BID  . aspirin EC  325 mg Oral Daily  . citalopram  20 mg Oral QPM  . dextrose      . docusate sodium  100 mg Oral BID  . famotidine  20 mg Oral QHS  . fluticasone  1 spray Each Nare Daily  . folic acid  1 mg Oral Daily  . heparin  5,000 Units Subcutaneous 3 times per day  . ketoconazole   Topical Daily  . meloxicam  7.5 mg Oral Daily  . montelukast  10 mg Oral QHS  . multivitamin  with minerals  1 tablet Oral Daily  . omega-3 acid ethyl esters  1 g Oral Daily  . oseltamivir  75 mg Oral BID  . rosuvastatin  5 mg Oral QHS  . thiamine  100 mg Oral Daily   Continuous Infusions: . sodium chloride 60 mL/hr at 05/26/14 0855   PRN Meds:.acetaminophen **OR** acetaminophen, bisacodyl, ipratropium-albuterol, ondansetron **OR** ondansetron (ZOFRAN) IV, oxyCODONE  DVT Prophylaxis   Heparin   Lab Results  Component Value Date   PLT 163 05/25/2014    Antibiotics     Anti-infectives    Start     Dose/Rate Route Frequency Ordered Stop   05/25/14 1100  oseltamivir (TAMIFLU) capsule 75 mg     75 mg Oral 2 times daily 05/25/14 1020 05/30/14 0959   05/25/14 0800  meropenem (MERREM) 1 g in sodium chloride 0.9 % 100 mL IVPB  Status:  Discontinued     1 g 200 mL/hr over 30 Minutes Intravenous Every 8 hours 05/25/14 0130 05/25/14 1351   05/25/14 0600  clindamycin (CLEOCIN) IVPB 600 mg  Status:  Discontinued     600 mg 100 mL/hr over 30 Minutes Intravenous 4 times  per day 05/25/14 0120 05/25/14 1020   05/25/14 0145  meropenem (MERREM) 1 g in sodium chloride 0.9 % 100 mL IVPB     1 g 200 mL/hr over 30 Minutes Intravenous  Once 05/25/14 0130 05/25/14 0402   05/24/14 2345  clindamycin (CLEOCIN) IVPB 900 mg     900 mg 100 mL/hr over 30 Minutes Intravenous  Once 05/24/14 2332 05/25/14 0041          Objective:   Filed Vitals:   05/25/14 2122 05/25/14 2203 05/26/14 0242 05/26/14 0510  BP: 113/60   99/47  Pulse: 88   74  Temp: 100.2 F (37.9 C) 99.3 F (37.4 C)  99.8 F (37.7 C)  TempSrc: Oral Oral  Oral  Resp: 18   18  Height:      Weight:      SpO2: 97%  98% 100%    Wt Readings from Last 3 Encounters:  05/25/14 73.211 kg (161 lb 6.4 oz)     Intake/Output Summary (Last 24 hours) at 05/26/14 1031 Last data filed at 05/26/14 0844  Gross per 24 hour  Intake   1130 ml  Output    325 ml  Net    805 ml     Physical Exam  Awake Alert, Oriented X 3, No new  F.N deficits, Normal affect [place].AT,PERRAL Supple Neck,No JVD, No cervical lymphadenopathy appriciated.  Symmetrical Chest wall movement, Good air movement bilaterally, CTAB RRR,No Gallops,Rubs or new Murmurs, No Parasternal Heave +ve B.Sounds, Abd Soft, No tenderness, No organomegaly appriciated, No rebound - guarding or rigidity. No Cyanosis, Clubbing or edema, No new Rash or bruise      Data Review   Micro Results Recent Results (from the past 240 hour(s))  Culture, blood (routine x 2)     Status: None (Preliminary result)   Collection Time: 05/24/14  9:20 PM  Result Value Ref Range Status   Specimen Description BLOOD LEFT WRIST  Final   Special Requests BOTTLES DRAWN AEROBIC AND ANAEROBIC 5CC EACH  Final   Culture   Final           BLOOD CULTURE RECEIVED NO GROWTH TO DATE CULTURE WILL BE HELD FOR 5 DAYS BEFORE ISSUING A FINAL NEGATIVE REPORT Performed at [facility]    Report Status PENDING  Incomplete  Culture, blood (routine x 2)     Status: None (Preliminary result)   Collection Time: 05/24/14  9:45 PM  Result Value Ref Range Status   Specimen Description BLOOD LEFT ARM  Final   Special Requests BOTTLES DRAWN AEROBIC AND ANAEROBIC 5CC  Final   Culture   Final           BLOOD CULTURE RECEIVED NO GROWTH TO DATE CULTURE WILL BE HELD FOR 5 DAYS BEFORE ISSUING A FINAL NEGATIVE REPORT Performed at [facility]    Report Status PENDING  Incomplete  Urine culture     Status: None   Collection Time: 05/24/14 10:20 PM  Result Value Ref Range Status   Specimen Description URINE, RANDOM  Final   Special Requests Normal  Final   Colony Count   Final    30,000 COLONIES/ML Performed at [facility]    Culture   Final    Multiple bacterial morphotypes present, none predominant. Suggest appropriate recollection if clinically indicated. Performed at [facility]    Report Status 05/26/2014 FINAL  Final  MRSA PCR Screening     Status: None    Collection Time:  05/25/14  2:05 AM  Result Value Ref Range Status   MRSA by PCR NEGATIVE NEGATIVE Final    Comment:        The GeneXpert MRSA Assay (FDA approved for NASAL specimens only), is one component of a comprehensive MRSA colonization surveillance program. It is not intended to diagnose MRSA infection nor to guide or monitor treatment for MRSA infections.     Radiology Reports Dg Chest Port 1 View  05/24/2014   CLINICAL DATA:  Shortness of breath for 1 month. Patient is at risk for checking in aspiration. History of esophageal motility disorder, cerebral palsy, pneumonia, esophageal stricture, quadriplegic spinal paralysis. Nonsmoker.  EXAM: PORTABLE CHEST - 1 VIEW  COMPARISON:  07/19/2013  FINDINGS: Examination is technically limited due to patient positioning and rotation. Suggestion of thoracic kyphosis. Shallow inspiration. Heart size and pulmonary vascularity are normal. No focal airspace disease or consolidation seen in the lungs. No pneumothorax. No blunting of costophrenic angles.  IMPRESSION: Shallow inspiration.  No evidence of active pulmonary disease.   Electronically Signed   By: Lucienne Capers M.D.   On: 05/24/2014 21:40     CBC  Recent Labs Lab 05/24/14 2120 05/25/14 0528  WBC 9.9 5.2  HGB 14.6 12.3*  HCT 42.1 36.6*  PLT 232 163  MCV 82.9 85.5  MCH 28.7 28.7  MCHC 34.7 33.6  RDW 12.7 13.1  LYMPHSABS 1.4  --   MONOABS 1.1*  --   EOSABS 0.1  --   BASOSABS 0.0  --     Chemistries   Recent Labs Lab 05/24/14 2120 05/25/14 0528  NA 140 142  K 3.8 3.9  CL 105 108  CO2 26 27  GLUCOSE 119* 95  BUN 14 15  CREATININE 0.73 0.68  CALCIUM 9.4 8.7  AST 24 20  ALT 20 16  ALKPHOS 109 83  BILITOT 0.3 0.5   ------------------------------------------------------------------------------------------------------------------ estimated creatinine clearance is 97.1 mL/min (by C-G formula based on Cr of  0.68). ------------------------------------------------------------------------------------------------------------------  Recent Labs  05/25/14 0528  HGBA1C 6.2*   ------------------------------------------------------------------------------------------------------------------ No results for input(s): CHOL, HDL, LDLCALC, TRIG, CHOLHDL, LDLDIRECT in the last 72 hours. ------------------------------------------------------------------------------------------------------------------  Recent Labs  05/25/14 0528  TSH 0.961   ------------------------------------------------------------------------------------------------------------------ No results for input(s): VITAMINB12, FOLATE, FERRITIN, TIBC, IRON, RETICCTPCT in the last 72 hours.  Coagulation profile No results for input(s): INR, PROTIME in the last 168 hours.  No results for input(s): DDIMER in the last 72 hours.  Cardiac Enzymes No results for input(s): CKMB, TROPONINI, MYOGLOBIN in the last 168 hours.  Invalid input(s): CK ------------------------------------------------------------------------------------------------------------------ Invalid input(s): POCBNP     Time Spent in minutes   35   Lala Lund K M.D on 05/26/2014 at 10:31 AM  Between 7am to 7pm - Pager - [phone]  After 7pm go to [email or web address] - password [facility]  [facility]   Office  [phone]

## 2014-05-26 NOTE — Progress Notes (Signed)
Speech Language Pathology Treatment:  (Patient was seen for self care visit.  )  Patient Details Name: Thomas Ramos MRN: [id number] DOB: 12-28-1970 Today's Date: 05/26/2014 Time: 2395-3202 SLP Time Calculation (min) (ACUTE ONLY): 8 min  Assessment / Plan / Recommendation Clinical Impression  The patient was scheduled for an MBS yesterday and was unable to participate secondary to emesis.  The patient's chart was reviewed and discussion with nursing to determine if the patient was appropriate for MBS today to determine the safest diet.  Nursing reported that the patient has no longer been vomiting and is appropriate for MBS today.  Exam will take place at approximately 1030am.     HPI HPI: Thomas Ramos is a 44 year old male who presents with cough and pneumonia. Patient states that he has been having episode of choking and gagging. He states that anytime he eats or drinks he has noted that he chokes and that he has reportedly had vomiting. He feels as though it goes down his lungs. He has had fevers also noted in the ED. In addition he also has a history of achalasia and has had attempts at stretching his esophagus in the past.      SLP Plan  MBS                  Plan: Brice, Deloise Marchant N 05/26/2014, 9:16 AM Shelly Flatten, [place], [facility] SLP [phone]

## 2014-05-26 NOTE — Progress Notes (Signed)
MD Candiss Norse at bedside and verbal order okay to give patient oral tamiflu and hold all other medications until after MBS test.

## 2014-05-26 NOTE — Progress Notes (Signed)
Hypoglycemic Event  CBG: 63  Treatment: D50 IV 50 mL  Symptoms: Pale, Sweaty and Hungry  Follow-up CBG: Time:0700 CBG Result: 190  Possible Reasons for Event: Inadequate meal intake  Comments/MD notified: Sharlene Motts L  Remember to initiate Hypoglycemia Order Set & complete

## 2014-05-26 NOTE — Progress Notes (Signed)
ANTIBIOTIC CONSULT NOTE - FOLLOW UP  Pharmacy Consult for vancomycin and meropenem Indication: pneumonia  Allergies  Allergen Reactions  . Penicillins Hives and Nausea And Vomiting    Patient Measurements: Height: 4\' 10"  (147.3 cm) Weight: 161 lb 6.4 oz (73.211 kg) IBW/kg (Calculated) : 45.4  Vital Signs: Temp: 101 F (38.3 C) (03/12 1348) Temp Source: Oral (03/12 1348) BP: 115/59 mmHg (03/12 1348) Pulse Rate: 92 (03/12 1348) Intake/Output from previous day: 03/11 0701 - 03/12 0700 In: 1130 [I.V.:1130] Out: 200 [Urine:200] Intake/Output from this shift: Total I/O In: -  Out: 125 [Urine:125]  Labs:  Recent Labs  05/24/14 2120 05/25/14 0528 05/26/14 0950  WBC 9.9 5.2 3.1*  HGB 14.6 12.3* 11.8*  PLT 232 163 129*  CREATININE 0.73 0.68 0.63   Estimated Creatinine Clearance: 97.1 mL/min (by C-G formula based on Cr of 0.63). No results for input(s): VANCOTROUGH, VANCOPEAK, VANCORANDOM, GENTTROUGH, GENTPEAK, GENTRANDOM, TOBRATROUGH, TOBRAPEAK, TOBRARND, AMIKACINPEAK, AMIKACINTROU, AMIKACIN in the last 72 hours.   Microbiology: Recent Results (from the past 720 hour(s))  Culture, blood (routine x 2)     Status: None (Preliminary result)   Collection Time: 05/24/14  9:20 PM  Result Value Ref Range Status   Specimen Description BLOOD LEFT WRIST  Final   Special Requests BOTTLES DRAWN AEROBIC AND ANAEROBIC 5CC EACH  Final   Culture   Final           BLOOD CULTURE RECEIVED NO GROWTH TO DATE CULTURE WILL BE HELD FOR 5 DAYS BEFORE ISSUING A FINAL NEGATIVE REPORT Performed at [facility]    Report Status PENDING  Incomplete  Culture, blood (routine x 2)     Status: None (Preliminary result)   Collection Time: 05/24/14  9:45 PM  Result Value Ref Range Status   Specimen Description BLOOD LEFT ARM  Final   Special Requests BOTTLES DRAWN AEROBIC AND ANAEROBIC 5CC  Final   Culture   Final           BLOOD CULTURE RECEIVED NO GROWTH TO DATE CULTURE WILL BE HELD FOR 5  DAYS BEFORE ISSUING A FINAL NEGATIVE REPORT Performed at [facility]    Report Status PENDING  Incomplete  Urine culture     Status: None   Collection Time: 05/24/14 10:20 PM  Result Value Ref Range Status   Specimen Description URINE, RANDOM  Final   Special Requests Normal  Final   Colony Count   Final    30,000 COLONIES/ML Performed at [facility]    Culture   Final    Multiple bacterial morphotypes present, none predominant. Suggest appropriate recollection if clinically indicated. Performed at [facility]    Report Status 05/26/2014 FINAL  Final  MRSA PCR Screening     Status: None   Collection Time: 05/25/14  2:05 AM  Result Value Ref Range Status   MRSA by PCR NEGATIVE NEGATIVE Final    Comment:        The GeneXpert MRSA Assay (FDA approved for NASAL specimens only), is one component of a comprehensive MRSA colonization surveillance program. It is not intended to diagnose MRSA infection nor to guide or monitor treatment for MRSA infections.     Anti-infectives    Start     Dose/Rate Route Frequency Ordered Stop   05/25/14 1100  oseltamivir (TAMIFLU) capsule 75 mg     75 mg Oral 2 times daily 05/25/14 1020 05/30/14 0959   05/25/14 0800  meropenem (MERREM) 1 g in sodium  chloride 0.9 % 100 mL IVPB  Status:  Discontinued     1 g 200 mL/hr over 30 Minutes Intravenous Every 8 hours 05/25/14 0130 05/25/14 1351   05/25/14 0600  clindamycin (CLEOCIN) IVPB 600 mg  Status:  Discontinued     600 mg 100 mL/hr over 30 Minutes Intravenous 4 times per day 05/25/14 0120 05/25/14 1020   05/25/14 0145  meropenem (MERREM) 1 g in sodium chloride 0.9 % 100 mL IVPB     1 g 200 mL/hr over 30 Minutes Intravenous  Once 05/25/14 0130 05/25/14 0402   05/24/14 2345  clindamycin (CLEOCIN) IVPB 900 mg     900 mg 100 mL/hr over 30 Minutes Intravenous  Once 05/24/14 2332 05/25/14 0041      Assessment: 44 y/o male with cerebral palsy and quadriplegia admitted  with back/leg pain w/ N/V and choking/gagging w/ eating, known h/o esophageal stricture and motility d/o. Pharmacy consulted to begin meropenem and vancomycin for PNA. He is febrile with Tmax 101. WBC are low. SCr is normal but difficult to determine true renal function with quadriplegia.   Tamiflu 3/12>> Meropenem 3/11 x1, 3/12>> Clinda 3/11 x1 Vanc 3/12>>  3/10 BCx2 - NGTD 3/10 UCx - NEG MRSA PCR NEG  Goal of Therapy:  Vancomycin trough level 15-20 mcg/ml Eradication of infection  Plan:  - Meropenem 1 g IV q8h - Vancomycin 1000 mg IV q12h - Monitor renal function, clinical course, and culture data - Vancomycin trough at steady state  [facility], [place].D., BCPS Clinical Pharmacist Pager: [phone] 05/26/2014 2:37 PM

## 2014-05-26 NOTE — Progress Notes (Signed)
RN called to pt room due to patient suctioning dark brown colored liquid from his mouth. RN able to view approximately 20-30 mL of dark brown substance in suction container. Patient asked if he coughed it up or was having emesis. Patient stated he coughed it up. No other distress noted. On-call provider made aware. Will await any new orders and continue to monitor.

## 2014-05-26 NOTE — Procedures (Signed)
Objective Swallowing Evaluation: Modified Barium Swallowing Study  Patient Details  Name: Thomas Ramos MRN: [id number] Date of Birth: 1971-03-03  Today's Date: 05/26/2014 Time: SLP Start Time (ACUTE ONLY): 1215-SLP Stop Time (ACUTE ONLY): 1245 SLP Time Calculation (min) (ACUTE ONLY): 30 min  Past Medical History:  Past Medical History  Diagnosis Date  . Motility disorder, esophageal   . Esophageal stricture   . Hypertension   . Pneumonia   . GERD (gastroesophageal reflux disease)   . CP (cerebral palsy)   . S/P Botox injection     approx every 4 months  . Seasonal allergies   . Environmental allergies     takes inhalers if needed  . Quadriplegic spinal paralysis   . Cerebral palsy    Past Surgical History:  Past Surgical History  Procedure Laterality Date  . Tendon release    . Mouth surgery    . Esophagus surgery      stretched esophagus  . Legs    . Esophagogastroduodenoscopy N/A 12/21/2012    Procedure: ESOPHAGOGASTRODUODENOSCOPY (EGD);  Surgeon: Inda Castle, MD;  Location: [facility];  Service: Endoscopy;  Laterality: N/A;  . Botox injection N/A 12/21/2012    Procedure: BOTOX INJECTION;  Surgeon: Inda Castle, MD;  Location: [facility];  Service: Endoscopy;  Laterality: N/A;   HPI:  HPI: Thomas Ramos is a 44 year old male who presents with cough and pneumonia. Patient states that he has been having episode of choking and gagging. He states that anytime he eats or drinks he has noted that he chokes and that he has reportedly had vomiting. He feels as though it goes down his lungs. He has had fevers also noted in the ED. In addition he also has a history of achalasia and has had attempts at stretching his esophagus in the past. BSE completed, with recommendation to proceed with MBS.  No Data Recorded  Assessment / Plan / Recommendation CHL IP CLINICAL IMPRESSIONS 05/26/2014  Dysphagia Diagnosis (None)  Clinical impression Mild oropharyngeal dysphagia,  characterized by excessive secretions orally, but otherwise functional oral phase. No anterior leakage of any consistency noted. Pharyngeally, Vallecular post-swallow residue was noted across consistencies due to decreased tongue base retraction. Dry swallow effectively cleared residue. Trace flash penetration of thin liquids was seen intermittently, during the swallow due to decreased airway closure, and after the swallow due to vallecular residue. Pt was able to follow commands to clear throat to remove penetrate. Puree, cracker, and barium tablet (given with water) were tolerated without penetration or aspiration, but with trace vallecular residue.  Dry swallow was effective to reduce post-swallow residue. Will begin conservative diet of dys 2/thin liquids with cues to swallow 2x, clear throat intermittently, and remain upright 30 minutes after eating/drinking. ST to follow for diet tolerance and education.      CHL IP TREATMENT RECOMMENDATION 05/26/2014  Treatment Plan Recommendations Therapy as outlined in treatment plan below     CHL IP DIET RECOMMENDATION 05/26/2014  Diet Recommendations Dysphagia 2 (Fine chop);Thin liquid  Liquid Administration via (None)  Medication Administration (None)  Compensations (None)  Postural Changes and/or Swallow Maneuvers (None)     CHL IP OTHER RECOMMENDATIONS 05/26/2014  Recommended Consults (None)  Oral Care Recommendations Oral care Q4 per protocol  Other Recommendations Clarify dietary restrictions     CHL IP FOLLOW UP RECOMMENDATIONS 05/26/2014  Follow up Recommendations 24 hour supervision/assistance     CHL IP FREQUENCY AND DURATION 05/26/2014  Speech Therapy Frequency (ACUTE ONLY)  min 2x/week  Treatment Duration 1 week     Pertinent Vitals/Pain No pain reported    SLP Swallow Goals No flowsheet data found.  No flowsheet data found.    CHL IP REASON FOR REFERRAL 05/26/2014  Reason for Referral Objectively evaluate swallowing function      CHL IP ORAL PHASE 05/26/2014  Lips (None)  Tongue (None)  Mucous membranes (None)  Nutritional status (None)  Other (None)  Oxygen therapy (None)  Oral Phase Impaired  Oral - Pudding Teaspoon (None)  Oral - Pudding Cup (None)  Oral - Honey Teaspoon (None)  Oral - Honey Cup (None)  Oral - Honey Syringe (None)  Oral - Nectar Teaspoon (None)  Oral - Nectar Cup (None)  Oral - Nectar Straw (None)  Oral - Nectar Syringe (None)  Oral - Ice Chips (None)  Oral - Thin Teaspoon (None)  Oral - Thin Cup (None)  Oral - Thin Straw (None)  Oral - Thin Syringe (None)  Oral - Puree (None)  Oral - Mechanical Soft (None)  Oral - Regular (None)  Oral - Multi-consistency (None)  Oral - Pill (None)  Oral Phase - Comment Excessive oral secretions, otherwise functional oral phase      CHL IP PHARYNGEAL PHASE 05/26/2014  Pharyngeal Phase Impaired  Pharyngeal - Pudding Teaspoon (None)  Penetration/Aspiration details (pudding teaspoon) (None)  Pharyngeal - Pudding Cup (None)  Penetration/Aspiration details (pudding cup) (None)  Pharyngeal - Honey Teaspoon (None)  Penetration/Aspiration details (honey teaspoon) (None)  Pharyngeal - Honey Cup (None)  Penetration/Aspiration details (honey cup) (None)  Pharyngeal - Honey Syringe (None)  Penetration/Aspiration details (honey syringe) (None)  Pharyngeal - Nectar Teaspoon (None)  Penetration/Aspiration details (nectar teaspoon) (None)  Pharyngeal - Nectar Cup (None)  Penetration/Aspiration details (nectar cup) (None)  Pharyngeal - Nectar Straw Reduced tongue base retraction;Pharyngeal residue - valleculae  Penetration/Aspiration details (nectar straw) (None)  Pharyngeal - Nectar Syringe (None)  Penetration/Aspiration details (nectar syringe) (None)  Pharyngeal - Ice Chips (None)  Penetration/Aspiration details (ice chips) (None)  Pharyngeal - Thin Teaspoon (None)  Penetration/Aspiration details (thin teaspoon) (None)  Pharyngeal - Thin Cup (None)   Penetration/Aspiration details (thin cup) (None)  Pharyngeal - Thin Straw Reduced tongue base retraction;Pharyngeal residue - valleculae;Reduced airway/laryngeal closure;Penetration/Aspiration after swallow;Penetration/Aspiration during swallow  Penetration/Aspiration details (thin straw) Material enters airway, remains ABOVE vocal cords then ejected out;Material does not enter airway  Pharyngeal - Thin Syringe (None)  Penetration/Aspiration details (thin syringe') (None)  Pharyngeal - Puree Reduced tongue base retraction;Pharyngeal residue - valleculae  Penetration/Aspiration details (puree) (None)  Pharyngeal - Mechanical Soft (None)  Penetration/Aspiration details (mechanical soft) (None)  Pharyngeal - Regular (None)  Penetration/Aspiration details (regular) (None)  Pharyngeal - Multi-consistency Reduced tongue base retraction;Pharyngeal residue - valleculae  Penetration/Aspiration details (multi-consistency) (None)  Pharyngeal - Pill Reduced pharyngeal peristalsis;Pharyngeal residue - valleculae  Penetration/Aspiration details (pill) (None)  Pharyngeal Comment (None)     CHL IP CERVICAL ESOPHAGEAL PHASE 05/26/2014  Cervical Esophageal Phase WFL  Pudding Teaspoon (None)  Pudding Cup (None)  Honey Teaspoon (None)  Honey Cup (None)  Honey Syringe (None)  Nectar Teaspoon (None)  Nectar Cup (None)  Nectar Straw (None)  Nectar Syringe (None)  Thin Teaspoon (None)  Thin Cup (None)  Thin Straw (None)  Thin Syringe (None)  Cervical Esophageal Comment (None)    No flowsheet data found.  Celia B. Quentin Ore [facility], CCC-SLP [phone]       Shonna Chock 05/26/2014, 1:15 PM

## 2014-05-27 ENCOUNTER — Inpatient Hospital Stay (HOSPITAL_COMMUNITY): Payer: Medicare Other

## 2014-05-27 DIAGNOSIS — R110 Nausea: Secondary | ICD-10-CM

## 2014-05-27 DIAGNOSIS — K920 Hematemesis: Secondary | ICD-10-CM | POA: Insufficient documentation

## 2014-05-27 DIAGNOSIS — R112 Nausea with vomiting, unspecified: Secondary | ICD-10-CM

## 2014-05-27 DIAGNOSIS — J1100 Influenza due to unidentified influenza virus with unspecified type of pneumonia: Secondary | ICD-10-CM

## 2014-05-27 LAB — BASIC METABOLIC PANEL
Anion gap: 5 (ref 5–15)
BUN: 11 mg/dL (ref 6–23)
CO2: 29 mmol/L (ref 19–32)
CREATININE: 0.51 mg/dL (ref 0.50–1.35)
Calcium: 8.2 mg/dL — ABNORMAL LOW (ref 8.4–10.5)
Chloride: 104 mmol/L (ref 96–112)
GFR calc Af Amer: >90 mL/min (ref >90)
GFR calc non Af Amer: >90 mL/min (ref >90)
GLUCOSE: 106 mg/dL — AB (ref 70–99)
POTASSIUM: 3.5 mmol/L (ref 3.5–5.1)
Sodium: 138 mmol/L (ref 135–145)

## 2014-05-27 LAB — HEMOGLOBIN AND HEMATOCRIT, BLOOD
HCT: 38.1 % — ABNORMAL LOW (ref 39.0–52.0)
HCT: 35.9 % — ABNORMAL LOW (ref 39.0–52.0)
HEMATOCRIT: 37.9 % — AB (ref 39.0–52.0)
HEMOGLOBIN: 12.5 g/dL — AB (ref 13.0–17.0)
Hemoglobin: 12.7 g/dL — ABNORMAL LOW (ref 13.0–17.0)
Hemoglobin: 11.8 g/dL — ABNORMAL LOW (ref 13.0–17.0)

## 2014-05-27 LAB — TYPE AND SCREEN
ABO/RH(D): O POS
Antibody Screen: NEGATIVE

## 2014-05-27 LAB — CBC
HEMATOCRIT: 36 % — AB (ref 39.0–52.0)
HEMOGLOBIN: 11.6 g/dL — AB (ref 13.0–17.0)
MCH: 27.9 pg (ref 26.0–34.0)
MCHC: 32.2 g/dL (ref 30.0–36.0)
MCV: 86.5 fL (ref 78.0–100.0)
Platelets: 129 10*3/uL — ABNORMAL LOW (ref 150–400)
RBC: 4.16 MIL/uL — ABNORMAL LOW (ref 4.22–5.81)
RDW: 13.2 % (ref 11.5–15.5)
WBC: 3.4 10*3/uL — AB (ref 4.0–10.5)

## 2014-05-27 LAB — GLUCOSE, CAPILLARY
GLUCOSE-CAPILLARY: 107 mg/dL — AB (ref 70–99)
GLUCOSE-CAPILLARY: 97 mg/dL (ref 70–99)
Glucose-Capillary: 125 mg/dL — ABNORMAL HIGH (ref 70–99)
Glucose-Capillary: 83 mg/dL (ref 70–99)

## 2014-05-27 LAB — ABO/RH: ABO/RH(D): O POS

## 2014-05-27 MED ORDER — PANTOPRAZOLE SODIUM 40 MG IV SOLR: 40.0000 mg | Freq: Two times a day (BID) | INTRAVENOUS | Status: DC

## 2014-05-27 MED ORDER — SODIUM CHLORIDE 0.9 % IV SOLN
80.0000 mg | Freq: Once | INTRAVENOUS | Status: AC
  Administered 2014-05-27: 80 mg via INTRAVENOUS
  Filled 2014-05-27: qty 80

## 2014-05-27 MED ORDER — SODIUM CHLORIDE 0.9 % IV SOLN
INTRAVENOUS | Status: AC
  Administered 2014-05-27: 08:00:00 via INTRAVENOUS

## 2014-05-27 MED ORDER — BISACODYL 10 MG RE SUPP
10.0000 mg | Freq: Every day | RECTAL | Status: DC
  Administered 2014-05-27: 10 mg via RECTAL
  Filled 2014-05-27: qty 1

## 2014-05-27 MED ORDER — SODIUM CHLORIDE 0.9 % IV SOLN
8.0000 mg/h | INTRAVENOUS | Status: DC
  Administered 2014-05-27: 8 mg/h via INTRAVENOUS
  Filled 2014-05-27 (×5): qty 80

## 2014-05-27 MED ORDER — PANTOPRAZOLE SODIUM 40 MG IV SOLR
40.0000 mg | Freq: Two times a day (BID) | INTRAVENOUS | Status: DC
  Filled 2014-05-27 (×2): qty 40

## 2014-05-27 NOTE — Progress Notes (Signed)
Patient Demographics  Thomas Ramos, is a 44 y.o. male, DOB - 27-Jul-1970, HQI:[id number]  Admit date - 05/24/2014   Admitting Physician Allyne Gee, MD  Outpatient Primary MD for the patient is [name]  LOS - 3   Chief Complaint  Patient presents with  . Back Pain  . Leg Pain        Subjective:   [name] today has, No headache, No chest pain, No abdominal pain - No Nausea, No new weakness tingling or numbness, No Cough - SOB.    Assessment & Plan    1. Fever, body aches due to influenza. With nausea vomiting . Suspicious for viral illness, continue  Tamiflu, a clear as aspirated some vomitus to therefore HCAP antibiotics along with blood cultures and aspiration precautions, speech following, continue supportive care and monitor.     2. History of cerebral palsy. Lives in group home. Supportive care.    3.H/O Dysphagia and Achalasia -  Speech following, B swallow  has been done, had developed nausea vomiting on 05/27/2014 with some hematemesis therefore now nothing by mouth, GI to see, question repeat EGD.     4.Dyslipidemia - on statin once taking by mouth.    5.HTN - continue Norvasc once taking by mouth.   6. Nausea vomiting with mild hematemesis on 05/27/2014. Likely small Mallory-Weiss tear, hold NSAIDs, aspirin, heparin for DVT prophylaxis. Placed on IV PPI. Monitor H&H, type screen done. GI called. May require EGD.        Code Status: Full  Family Communication:  none  Disposition Plan: Group Home   Procedures   B Swallow   Consults  Speech, GI   Medications  Scheduled Meds: . amLODipine  5 mg Oral QPM  . antiseptic oral rinse  7 mL Mouth Rinse BID  . bisacodyl  10 mg Rectal Daily  . citalopram  20 mg Oral QPM  . docusate sodium  100 mg  Oral BID  . famotidine  20 mg Oral QHS  . fluticasone  1 spray Each Nare Daily  . folic acid  1 mg Oral Daily  . ketoconazole   Topical Daily  . meropenem (MERREM) IV  1 g Intravenous 3 times per day  . montelukast  10 mg Oral QHS  . multivitamin with minerals  1 tablet Oral Daily  . omega-3 acid ethyl esters  1 g Oral Daily  . oseltamivir  75 mg Oral BID  . [START ON 05/30/2014] pantoprazole (PROTONIX) IV  40 mg Intravenous Q12H  . rosuvastatin  5 mg Oral QHS  . thiamine  100 mg Oral Daily  . vancomycin  1,000 mg Intravenous Q12H   Continuous Infusions: . sodium chloride 60 mL/hr at 05/27/14 0821  . pantoprozole (PROTONIX) infusion 8 mg/hr (05/27/14 0951)   PRN Meds:.acetaminophen **OR** acetaminophen, ipratropium-albuterol, ondansetron **OR** ondansetron (ZOFRAN) IV, oxyCODONE  DVT Prophylaxis   Heparin   Lab Results  Component Value Date   PLT 129* 05/27/2014    Antibiotics     Anti-infectives    Start     Dose/Rate Route Frequency Ordered Stop   05/26/14 1515  meropenem (MERREM) 1 g in sodium chloride 0.9 % 100 mL IVPB     1 g 200  mL/hr over 30 Minutes Intravenous 3 times per day 05/26/14 1440     05/26/14 1500  vancomycin (VANCOCIN) IVPB 1000 mg/200 mL premix     1,000 mg 200 mL/hr over 60 Minutes Intravenous Every 12 hours 05/26/14 1440     05/25/14 1100  oseltamivir (TAMIFLU) capsule 75 mg     75 mg Oral 2 times daily 05/25/14 1020 05/30/14 0959   05/25/14 0800  meropenem (MERREM) 1 g in sodium chloride 0.9 % 100 mL IVPB  Status:  Discontinued     1 g 200 mL/hr over 30 Minutes Intravenous Every 8 hours 05/25/14 0130 05/25/14 1351   05/25/14 0600  clindamycin (CLEOCIN) IVPB 600 mg  Status:  Discontinued     600 mg 100 mL/hr over 30 Minutes Intravenous 4 times per day 05/25/14 0120 05/25/14 1020   05/25/14 0145  meropenem (MERREM) 1 g in sodium chloride 0.9 % 100 mL IVPB     1 g 200 mL/hr over 30 Minutes Intravenous  Once 05/25/14 0130 05/25/14 0402   05/24/14  2345  clindamycin (CLEOCIN) IVPB 900 mg     900 mg 100 mL/hr over 30 Minutes Intravenous  Once 05/24/14 2332 05/25/14 0041          Objective:   Filed Vitals:   05/26/14 1617 05/26/14 1748 05/26/14 2150 05/27/14 0606  BP:  108/60 125/65 107/62  Pulse:   87 81  Temp: 99.4 F (37.4 C)  98.8 F (37.1 C) 99.5 F (37.5 C)  TempSrc: Oral  Oral Oral  Resp:   34 24  Height:      Weight:      SpO2:   96% 94%    Wt Readings from Last 3 Encounters:  05/25/14 73.211 kg (161 lb 6.4 oz)     Intake/Output Summary (Last 24 hours) at 05/27/14 1028 Last data filed at 05/27/14 0915  Gross per 24 hour  Intake   1320 ml  Output    375 ml  Net    945 ml     Physical Exam  Awake Alert, Oriented X 3, No new F.N deficits, Normal affect [place].AT,PERRAL Supple Neck,No JVD, No cervical lymphadenopathy appriciated.  Symmetrical Chest wall movement, Good air movement bilaterally, CTAB RRR,No Gallops,Rubs or new Murmurs, No Parasternal Heave +ve B.Sounds, Abd Soft, No tenderness, No organomegaly appriciated, No rebound - guarding or rigidity. No Cyanosis, Clubbing or edema, No new Rash or bruise      Data Review   Micro Results Recent Results (from the past 240 hour(s))  Culture, blood (routine x 2)     Status: None (Preliminary result)   Collection Time: 05/24/14  9:20 PM  Result Value Ref Range Status   Specimen Description BLOOD LEFT WRIST  Final   Special Requests BOTTLES DRAWN AEROBIC AND ANAEROBIC 5CC EACH  Final   Culture   Final           BLOOD CULTURE RECEIVED NO GROWTH TO DATE CULTURE WILL BE HELD FOR 5 DAYS BEFORE ISSUING A FINAL NEGATIVE REPORT Performed at [facility]    Report Status PENDING  Incomplete  Culture, blood (routine x 2)     Status: None (Preliminary result)   Collection Time: 05/24/14  9:45 PM  Result Value Ref Range Status   Specimen Description BLOOD LEFT ARM  Final   Special Requests BOTTLES DRAWN AEROBIC AND ANAEROBIC 5CC  Final   Culture    Final           BLOOD  CULTURE RECEIVED NO GROWTH TO DATE CULTURE WILL BE HELD FOR 5 DAYS BEFORE ISSUING A FINAL NEGATIVE REPORT Performed at [facility]    Report Status PENDING  Incomplete  Urine culture     Status: None   Collection Time: 05/24/14 10:20 PM  Result Value Ref Range Status   Specimen Description URINE, RANDOM  Final   Special Requests Normal  Final   Colony Count   Final    30,000 COLONIES/ML Performed at [facility]    Culture   Final    Multiple bacterial morphotypes present, none predominant. Suggest appropriate recollection if clinically indicated. Performed at [facility]    Report Status 05/26/2014 FINAL  Final  MRSA PCR Screening     Status: None   Collection Time: 05/25/14  2:05 AM  Result Value Ref Range Status   MRSA by PCR NEGATIVE NEGATIVE Final    Comment:        The GeneXpert MRSA Assay (FDA approved for NASAL specimens only), is one component of a comprehensive MRSA colonization surveillance program. It is not intended to diagnose MRSA infection nor to guide or monitor treatment for MRSA infections.   Culture, blood (routine x 2)     Status: None (Preliminary result)   Collection Time: 05/26/14  3:10 PM  Result Value Ref Range Status   Specimen Description BLOOD LEFT HAND  Final   Special Requests BOTTLES DRAWN AEROBIC AND ANAEROBIC 10CC  Final   Culture   Final           BLOOD CULTURE RECEIVED NO GROWTH TO DATE CULTURE WILL BE HELD FOR 5 DAYS BEFORE ISSUING A FINAL NEGATIVE REPORT Performed at [facility]    Report Status PENDING  Incomplete  Culture, blood (routine x 2)     Status: None (Preliminary result)   Collection Time: 05/26/14  3:17 PM  Result Value Ref Range Status   Specimen Description BLOOD LEFT WRIST  Final   Special Requests BOTTLES DRAWN AEROBIC AND ANAEROBIC 10CC  Final   Culture   Final           BLOOD CULTURE RECEIVED NO GROWTH TO DATE CULTURE WILL BE HELD FOR 5 DAYS BEFORE ISSUING  A FINAL NEGATIVE REPORT Performed at [facility]    Report Status PENDING  Incomplete    Radiology Reports Dg Chest 2 View  05/26/2014   CLINICAL DATA:  One day history of cough and fever.  EXAM: CHEST  2 VIEW  COMPARISON:  05/24/2014 dating back to 11/19/2011.  FINDINGS: Suboptimal inspiration accounts for crowded bronchovascular markings diffusely and atelectasis in the bases, and accentuates the cardiac silhouette. Taking this into account, cardiac silhouette upper normal in size to slightly enlarged. Lungs otherwise clear. No localized airspace consolidation. No pleural effusions. No pneumothorax. Normal pulmonary vascularity. Residual barium in the stomach and in the visualized colon from the swallowing function study earlier today.  IMPRESSION: Suboptimal inspiration accounts for bibasilar atelectasis. No acute cardiopulmonary disease otherwise.   Electronically Signed   By: Evangeline Dakin M.D.   On: 05/26/2014 16:22   Dg Chest Port 1 View  05/24/2014   CLINICAL DATA:  Shortness of breath for 1 month. Patient is at risk for checking in aspiration. History of esophageal motility disorder, cerebral palsy, pneumonia, esophageal stricture, quadriplegic spinal paralysis. Nonsmoker.  EXAM: PORTABLE CHEST - 1 VIEW  COMPARISON:  07/19/2013  FINDINGS: Examination is technically limited due to patient positioning and rotation. Suggestion of thoracic kyphosis.  Shallow inspiration. Heart size and pulmonary vascularity are normal. No focal airspace disease or consolidation seen in the lungs. No pneumothorax. No blunting of costophrenic angles.  IMPRESSION: Shallow inspiration.  No evidence of active pulmonary disease.   Electronically Signed   By: Lucienne Capers M.D.   On: 05/24/2014 21:40   Dg Abd Portable 1v  05/27/2014   CLINICAL DATA:  Ileus.  Coffee ground emesis.  EXAM: PORTABLE ABDOMEN - 1 VIEW  COMPARISON:  No prior abdominal exams.  FINDINGS: There is contrast throughout distal small  bowel and colon from barium swallow performed 1 day prior. No bowel obstruction. Moderate stool distending the rectum. Mild gaseous distention of small bowel loops in the central abdomen consistent with ileus. There is mild gastric gastric distension. Evaluation for radiopaque calculi is limited due to the presence of oral contrast. There is no evidence pneumatosis or free air. Dysplastic appearance of the left hip.  IMPRESSION: Findings consistent with ileus. There is oral contrast throughout the distal small bowel in colon from barium swallow performed 1 day prior, no obstruction.   Electronically Signed   By: Jeb Levering M.D.   On: 05/27/2014 01:48   Dg Swallowing Func-speech Pathology  05/26/2014   Lorre Nick, CCC-SLP     05/26/2014  1:16 PM  Objective Swallowing Evaluation: Modified Barium Swallowing Study   Patient Details  Name: SHEPHERD FINNAN MRN: [id number] Date of Birth: May 03, 1970  Today's Date: 05/26/2014 Time: SLP Start Time (ACUTE ONLY): 1215-SLP Stop Time (ACUTE  ONLY): 1245 SLP Time Calculation (min) (ACUTE ONLY): 30 min  Past Medical History:  Past Medical History  Diagnosis Date  . Motility disorder, esophageal   . Esophageal stricture   . Hypertension   . Pneumonia   . GERD (gastroesophageal reflux disease)   . CP (cerebral palsy)   . S/P Botox injection     approx every 4 months  . Seasonal allergies   . Environmental allergies     takes inhalers if needed  . Quadriplegic spinal paralysis   . Cerebral palsy    Past Surgical History:  Past Surgical History  Procedure Laterality Date  . Tendon release    . Mouth surgery    . Esophagus surgery      stretched esophagus  . Legs    . Esophagogastroduodenoscopy N/A 12/21/2012    Procedure: ESOPHAGOGASTRODUODENOSCOPY (EGD);  Surgeon: Inda Castle, MD;  Location: [facility];  Service: Endoscopy;   Laterality: N/A;  . Botox injection N/A 12/21/2012    Procedure: BOTOX INJECTION;  Surgeon: Inda Castle, MD;   Location: [facility];  Service:  Endoscopy;  Laterality: N/A;   HPI:  HPI: JAVARUS DORNER is a 44 year old male who presents with cough  and pneumonia. Patient states that he has been having episode of  choking and gagging. He states that anytime he eats or drinks he  has noted that he chokes and that he has reportedly had vomiting.  He feels as though it goes down his lungs. He has had fevers also  noted in the ED. In addition he also has a history of achalasia  and has had attempts at stretching his esophagus in the past. BSE  completed, with recommendation to proceed with MBS.  No Data Recorded  Assessment / Plan / Recommendation CHL IP CLINICAL IMPRESSIONS 05/26/2014  Dysphagia Diagnosis (None)  Clinical impression Mild oropharyngeal dysphagia, characterized  by excessive secretions orally, but otherwise functional oral  phase. No anterior  leakage of any consistency noted.  Pharyngeally, Vallecular post-swallow residue was noted across  consistencies due to decreased tongue base retraction. Dry  swallow effectively cleared residue. Trace flash penetration of  thin liquids was seen intermittently, during the swallow due to  decreased airway closure, and after the swallow due to vallecular  residue. Pt was able to follow commands to clear throat to remove  penetrate. Puree, cracker, and barium tablet (given with water)  were tolerated without penetration or aspiration, but with trace  vallecular residue.  Dry swallow was effective to reduce  post-swallow residue. Will begin conservative diet of dys 2/thin  liquids with cues to swallow 2x, clear throat intermittently, and  remain upright 30 minutes after eating/drinking. ST to follow for  diet tolerance and education.      CHL IP TREATMENT RECOMMENDATION 05/26/2014  Treatment Plan Recommendations Therapy as outlined in treatment  plan below     CHL IP DIET RECOMMENDATION 05/26/2014  Diet Recommendations Dysphagia 2 (Fine chop);Thin liquid  Liquid Administration via (None)  Medication Administration  (None)  Compensations (None)  Postural Changes and/or Swallow Maneuvers (None)     CHL IP OTHER RECOMMENDATIONS 05/26/2014  Recommended Consults (None)  Oral Care Recommendations Oral care Q4 per protocol  Other Recommendations Clarify dietary restrictions     CHL IP FOLLOW UP RECOMMENDATIONS 05/26/2014 Follow up  Recommendations 24 hour supervision/assistance     CHL IP FREQUENCY AND DURATION 05/26/2014  Speech Therapy Frequency (ACUTE ONLY) min 2x/week  Treatment Duration 1 week     Pertinent Vitals/Pain No pain reported    SLP Swallow Goals No flowsheet data found.  No flowsheet data found.    CHL IP REASON FOR REFERRAL 05/26/2014  Reason for Referral Objectively evaluate swallowing function     CHL IP ORAL PHASE 05/26/2014  Lips (None)  Tongue (None)  Mucous membranes (None)  Nutritional status (None)  Other (None)  Oxygen therapy (None)  Oral Phase Impaired  Oral - Pudding Teaspoon (None)  Oral - Pudding Cup (None)  Oral - Honey Teaspoon (None)  Oral - Honey Cup (None)  Oral - Honey Syringe (None)  Oral - Nectar Teaspoon (None)  Oral - Nectar Cup (None)  Oral - Nectar Straw (None)  Oral - Nectar Syringe (None)  Oral - Ice Chips (None)  Oral - Thin Teaspoon (None)  Oral - Thin Cup (None)  Oral - Thin Straw (None)  Oral - Thin Syringe (None)  Oral - Puree (None)  Oral - Mechanical Soft (None)  Oral - Regular (None) Oral - Multi-consistency (None)  Oral - Pill (None)  Oral Phase - Comment Excessive oral secretions, otherwise  functional oral phase      CHL IP PHARYNGEAL PHASE 05/26/2014  Pharyngeal Phase Impaired  Pharyngeal - Pudding Teaspoon (None)  Penetration/Aspiration details (pudding teaspoon) (None)  Pharyngeal - Pudding Cup (None)  Penetration/Aspiration details (pudding cup) (None)  Pharyngeal - Honey Teaspoon (None)  Penetration/Aspiration details (honey teaspoon) (None)  Pharyngeal - Honey Cup (None)  Penetration/Aspiration details (honey cup) (None)  Pharyngeal - Honey Syringe (None)  Penetration/Aspiration  details (honey syringe) (None)  Pharyngeal - Nectar Teaspoon (None)  Penetration/Aspiration details (nectar teaspoon) (None)  Pharyngeal - Nectar Cup (None)  Penetration/Aspiration details (nectar cup) (None)  Pharyngeal - Nectar Straw Reduced tongue base  retraction;Pharyngeal residue - valleculae  Penetration/Aspiration details (nectar straw) (None)  Pharyngeal - Nectar Syringe (None)  Penetration/Aspiration details (nectar syringe) (None)  Pharyngeal - Ice Chips (None)  Penetration/Aspiration details (ice chips) (None)  Pharyngeal - Thin Teaspoon (None)  Penetration/Aspiration details (thin teaspoon) (None)  Pharyngeal - Thin Cup (None)  Penetration/Aspiration details (thin cup) (None)  Pharyngeal - Thin Straw Reduced tongue base retraction;Pharyngeal  residue - valleculae;Reduced airway/laryngeal  closure;Penetration/Aspiration after  swallow;Penetration/Aspiration during swallow  Penetration/Aspiration details (thin straw) Material enters  airway, remains ABOVE vocal cords then ejected out;Material does  not enter airway  Pharyngeal - Thin Syringe (None)  Penetration/Aspiration details (thin syringe') (None)  Pharyngeal - Puree Reduced tongue base retraction;Pharyngeal  residue - valleculae  Penetration/Aspiration details (puree) (None)  Pharyngeal - Mechanical Soft (None)  Penetration/Aspiration details (mechanical soft) (None)  Pharyngeal - Regular (None)  Penetration/Aspiration details (regular) (None)  Pharyngeal - Multi-consistency Reduced tongue base  retraction;Pharyngeal residue - valleculae  Penetration/Aspiration details (multi-consistency) (None)  Pharyngeal - Pill Reduced pharyngeal peristalsis;Pharyngeal  residue - valleculae  Penetration/Aspiration details (pill) (None)  Pharyngeal Comment (None)     CHL IP CERVICAL ESOPHAGEAL PHASE 05/26/2014  Cervical Esophageal Phase WFL  Pudding Teaspoon (None)  Pudding Cup (None)  Honey Teaspoon (None)  Honey Cup (None)  Honey Syringe (None)  Nectar Teaspoon  (None)  Nectar Cup (None)  Nectar Straw (None)  Nectar Syringe (None)  Thin Teaspoon (None)  Thin Cup (None)  Thin Straw (None)  Thin Syringe (None)  Cervical Esophageal Comment (None)    No flowsheet data found.  Celia B. Quentin Ore [facility], CCC-SLP [phone]       Shonna Chock 05/26/2014, 1:15 PM      CBC  Recent Labs Lab 05/24/14 2120 05/25/14 0528 05/26/14 0950 05/27/14 0632  WBC 9.9 5.2 3.1* 3.4*  HGB 14.6 12.3* 11.8* 11.6*  HCT 42.1 36.6* 37.0* 36.0*  PLT 232 163 129* 129*  MCV 82.9 85.5 87.9 86.5  MCH 28.7 28.7 28.0 27.9  MCHC 34.7 33.6 31.9 32.2  RDW 12.7 13.1 13.3 13.2  LYMPHSABS 1.4  --   --   --   MONOABS 1.1*  --   --   --   EOSABS 0.1  --   --   --   BASOSABS 0.0  --   --   --     Chemistries   Recent Labs Lab 05/24/14 2120 05/25/14 0528 05/26/14 0950 05/27/14 0632  NA 140 142 141 138  K 3.8 3.9 3.7 3.5  CL 105 108 108 104  CO2 26 27 25 29   GLUCOSE 119* 95 89 106*  BUN 14 15 12 11   CREATININE 0.73 0.68 0.63 0.51  CALCIUM 9.4 8.7 8.5 8.2*  AST 24 20  --   --   ALT 20 16  --   --   ALKPHOS 109 83  --   --   BILITOT 0.3 0.5  --   --    ------------------------------------------------------------------------------------------------------------------ estimated creatinine clearance is 97.1 mL/min (by C-G formula based on Cr of 0.51). ------------------------------------------------------------------------------------------------------------------  Recent Labs  05/25/14 0528  HGBA1C 6.2*   ------------------------------------------------------------------------------------------------------------------ No results for input(s): CHOL, HDL, LDLCALC, TRIG, CHOLHDL, LDLDIRECT in the last 72 hours. ------------------------------------------------------------------------------------------------------------------  Recent Labs  05/25/14 0528  TSH 0.961    ------------------------------------------------------------------------------------------------------------------ No results for input(s): VITAMINB12, FOLATE, FERRITIN, TIBC, IRON, RETICCTPCT in the last 72 hours.  Coagulation profile No results for input(s): INR, PROTIME in the last 168 hours.  No results for input(s): DDIMER in the last 72 hours.  Cardiac Enzymes No results for input(s): CKMB, TROPONINI, MYOGLOBIN in the last 168 hours.  Invalid input(s): CK ------------------------------------------------------------------------------------------------------------------ Invalid input(s): POCBNP     Time Spent  in minutes   35   Lala Lund K M.D on 05/27/2014 at 10:28 AM  Between 7am to 7pm - Pager - [phone]  After 7pm go to [email or web address] - password [facility]  [facility]   Office  [phone]

## 2014-05-27 NOTE — Evaluation (Addendum)
Physical Therapy Evaluation and D/C Patient Details Name: VERNICE BOWKER MRN: [id number] DOB: [birth year] Today's Date: 05/27/2014   History of Present Illness  Pt admit for aspiration PNA and flu.  Clinical Impression  Pt admitted with above diagnosis. Pt currently with functional limitations due to chronic deficits. Pt appears to be at baseline.  Has 24 hour caregivers.  REcommended to nurse that if pt stays that caregivers bring chair up and then pt can get up to wheelchair with hospital mechanical lift.  Do not feel it would be safe for pt to get up to recliner.  Nurse feels that pt will go home tomorrow but will pass along information to caregivers if pt stays.  Nurse asked this PT about transport to group home and this PT agrees that ambulance transport will most likely be best.  Pt has no PT needs at this time therefore will sign off.       Follow Up Recommendations No PT follow up    Equipment Recommendations  None recommended by PT    Recommendations for Other Services       Precautions / Restrictions Precautions Precautions: Fall (DROPLET) Restrictions Weight Bearing Restrictions: No      Mobility  Bed Mobility Overal bed mobility: Needs Assistance;+2 for physical assistance Bed Mobility: Rolling Rolling: Max assist;+2 for physical assistance         General bed mobility comments: Pt can only use left hand functionally.  Used hand on rail to roll to left.  Assisted nurse in moving pt up in bed and positioning with pillows.    Transfers                 General transfer comment: N/A as pt hoyer lifted at group home   Ambulation/Gait                Stairs            Wheelchair Mobility    Modified Rankin (Stroke Patients Only)       Balance                                             Pertinent Vitals/Pain Pain Assessment: No/denies pain  VSS    Home Living Family/patient expects to be discharged to:: Group  home Living Arrangements: Non-relatives/Friends Available Help at Discharge: Personal care attendant;Available 24 hours/day Type of Home: Group Home Home Access: Level entry       Home Equipment: Wheelchair - power      Prior Function Level of Independence: Needs assistance   Gait / Transfers Assistance Needed: hoyer lift to chair  ADL's / Homemaking Assistance Needed: total assist        Hand Dominance        Extremity/Trunk Assessment   Upper Extremity Assessment: Defer to OT evaluation           Lower Extremity Assessment: RLE deficits/detail;LLE deficits/detail RLE Deficits / Details: No functional movement.  Tone bil LEs LLE Deficits / Details: No functional movement, tone bil LEs     Communication      Cognition Arousal/Alertness: Awake/alert Behavior During Therapy: Flat affect Overall Cognitive Status: History of cognitive impairments - at baseline                      General Comments General comments (skin integrity, edema, etc.): Pt states  that caregivers assist him 24 hours day.      Exercises        Assessment/Plan    PT Assessment Patent does not need any further PT services  PT Diagnosis Quadraplegia   PT Problem List    PT Treatment Interventions     PT Goals (Current goals can be found in the Care Plan section) Acute Rehab PT Goals PT Goal Formulation: All assessment and education complete, DC therapy    Frequency     Barriers to discharge        Co-evaluation               End of Session Equipment Utilized During Treatment: Oxygen Activity Tolerance: Patient limited by fatigue (limited by prior functional level) Patient left: in bed;with call bell/phone within reach;with nursing/sitter in room Nurse Communication: Need for lift equipment;Mobility status         Time: 0952-1003 PT Time Calculation (min) (ACUTE ONLY): 11 min   Charges:   PT Evaluation $Initial PT Evaluation Tier I: 1 Procedure      PT G CodesDenice Paradise 2014/06/14, 11:41 AM Amanda Cockayne Acute Rehabilitation [phone] (pager)

## 2014-05-27 NOTE — Consult Note (Signed)
Referring Provider: [facility] Primary Care Physician:  Aura Dials, PA-C Primary Gastroenterologist:  Dr.Kaplan  Reason for Consultation:   hematemesis    HPI: Thomas Ramos is a 44 y.o. male and dysphagia. He has a history of cerebral palsy. Had an EGD with Botox injection in 2012 and again in October 2014. He was last seen in the office 01/30/2013 at which time he reported that he was doing well with significant improvement of his dysphagia. Over the past 1-2 months, the patient has been having recurrent episodes of choking and gagging. He feels that when he eats food "goes into my lungs". He presented to the emergency room on arch 10th with complaints of cough and pneumonia. He was admitted for aspiration pneumonia although there was no obvious infiltrate on chest x-ray. He tested positive for influenza A . Due to his dysphagia, cough, and sputtering he was evaluated by speech and language pathology with a bedside swallow evaluation on the 11th. 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 The patient apparently vomited several times yesterday and had some blood in his vomitus. He denies melena or bright red blood per rectum. Past Medical History  Diagnosis Date  . Motility disorder, esophageal   . Esophageal stricture   . Hypertension   . Pneumonia   . GERD (gastroesophageal reflux disease)   . CP (cerebral palsy)   . S/P Botox injection     approx every 4 months  . Seasonal allergies   . Environmental allergies     takes inhalers if needed  . Quadriplegic spinal paralysis   . Cerebral palsy     Past Surgical History  Procedure Laterality Date  . Tendon release    . Mouth surgery    . Esophagus surgery      stretched esophagus  . Legs    . Esophagogastroduodenoscopy N/A 12/21/2012    Procedure: ESOPHAGOGASTRODUODENOSCOPY (EGD);  Surgeon: Inda Castle, MD;  Location: [facility];  Service: Endoscopy;  Laterality: N/A;  . Botox injection N/A 12/21/2012   Procedure: BOTOX INJECTION;  Surgeon: Inda Castle, MD;  Location: [facility];  Service: Endoscopy;  Laterality: N/A;    Prior to Admission medications   Medication Sig Start Date End Date Taking? Authorizing Provider  amLODipine (NORVASC) 5 MG tablet Take 5 mg by mouth every evening.   Yes Historical Provider, MD  cetirizine (ZYRTEC) 10 MG tablet Take 10 mg by mouth daily.  03/01/14  Yes Historical Provider, MD  citalopram (CELEXA) 20 MG tablet Take 20 mg by mouth every evening.   Yes Historical Provider, MD  CRESTOR 5 MG tablet Take 5 mg by mouth at bedtime. 04/22/14  Yes Historical Provider, MD  econazole nitrate 1 % cream Apply 1 application topically daily.  05/16/14 05/16/15 Yes Historical Provider, MD  fluticasone (FLONASE) 50 MCG/ACT nasal spray Place 1 spray into both nostrils daily.  03/01/14  Yes Historical Provider, MD  meloxicam (MOBIC) 7.5 MG tablet Take 7.5 mg by mouth daily.   Yes Historical Provider, MD  montelukast (SINGULAIR) 10 MG tablet Take 10 mg by mouth at bedtime.  03/01/14 03/01/15 Yes Historical Provider, MD  Multiple Vitamin (MULTIVITAMIN WITH MINERALS) TABS tablet Take 1 tablet by mouth daily.   Yes Historical Provider, MD  Omega-3 Fatty Acids (FISH OIL) 1000 MG CAPS Take 1 capsule by mouth daily.   Yes Historical Provider, MD  PROAIR HFA 108 (90 BASE) MCG/ACT inhaler Inhale 2 puffs into the lungs every 6 (six) hours  as needed for wheezing or shortness of breath.  12/29/12  Yes Historical Provider, MD  ranitidine (ZANTAC) 150 MG tablet Take 150 mg by mouth at bedtime.  04/30/14 04/30/15 Yes Historical Provider, MD  guaiFENesin (MUCINEX) 600 MG 12 hr tablet Take 600 mg by mouth 2 (two) times daily.    Historical Provider, MD  hydrochlorothiazide 25 MG tablet Take 25 mg by mouth every evening.     Historical Provider, MD  omeprazole (PRILOSEC) 20 MG capsule Take 20 mg by mouth daily.    Historical Provider, MD  oxyCODONE-acetaminophen (PERCOCET/ROXICET) 5-325 MG per tablet  Take 1-2 pills every 4-6 hours as needed for pain. Patient not taking: Reported on 05/24/2014 07/19/13   Noland Fordyce, PA-C  propranolol (INDERAL) 40 MG tablet Take 40 mg by mouth 3 (three) times daily.    Historical Provider, MD  psyllium (REGULOID) 0.52 G capsule Take 0.52 g by mouth daily.    Historical Provider, MD    Current Facility-Administered Medications  Medication Dose Route Frequency Provider Last Rate Last Dose  . 0.9 %  sodium chloride infusion   Intravenous Continuous Thurnell Lose, MD 60 mL/hr at 05/27/14 [phone]    . acetaminophen (TYLENOL) tablet 650 mg  650 mg Oral Q6H PRN Allyne Gee, MD       Or  . acetaminophen (TYLENOL) suppository 650 mg  650 mg Rectal Q6H PRN Allyne Gee, MD   650 mg at 05/25/14 1925  . amLODipine (NORVASC) tablet 5 mg  5 mg Oral QPM Allyne Gee, MD   5 mg at 05/26/14 1744  . antiseptic oral rinse (CPC / CETYLPYRIDINIUM CHLORIDE 0.05%) solution 7 mL  7 mL Mouth Rinse BID Thurnell Lose, MD   7 mL at 05/26/14 2200  . bisacodyl (DULCOLAX) suppository 10 mg  10 mg Rectal Daily Thurnell Lose, MD      . citalopram (CELEXA) tablet 20 mg  20 mg Oral QPM Allyne Gee, MD   20 mg at 05/26/14 1413  . docusate sodium (COLACE) capsule 100 mg  100 mg Oral BID Allyne Gee, MD   100 mg at 05/26/14 2146  . famotidine (PEPCID) tablet 20 mg  20 mg Oral QHS Allyne Gee, MD   20 mg at 05/26/14 2146  . fluticasone (FLONASE) 50 MCG/ACT nasal spray 1 spray  1 spray Each Nare Daily Allyne Gee, MD   1 spray at 05/26/14 0956  . folic acid (FOLVITE) tablet 1 mg  1 mg Oral Daily Allyne Gee, MD   1 mg at 05/26/14 1413  . ipratropium-albuterol (DUONEB) 0.5-2.5 (3) MG/3ML nebulizer solution 3 mL  3 mL Nebulization Q4H PRN Thurnell Lose, MD   3 mL at 05/26/14 2240  . ketoconazole (NIZORAL) 2 % cream   Topical Daily Allyne Gee, MD      . meropenem (MERREM) 1 g in sodium chloride 0.9 % 100 mL IVPB  1 g Intravenous 3 times per day Alvira Philips, RPH   1 g at  05/27/14 0156  . montelukast (SINGULAIR) tablet 10 mg  10 mg Oral QHS Allyne Gee, MD   10 mg at 05/26/14 2146  . multivitamin with minerals tablet 1 tablet  1 tablet Oral Daily Allyne Gee, MD   1 tablet at 05/26/14 1413  . omega-3 acid ethyl esters (LOVAZA) capsule 1 g  1 g Oral Daily Allyne Gee, MD   1 g at 05/26/14 1413  .  ondansetron (ZOFRAN) tablet 4 mg  4 mg Oral Q6H PRN Allyne Gee, MD       Or  . ondansetron [facility]) injection 4 mg  4 mg Intravenous Q6H PRN Allyne Gee, MD   4 mg at 05/26/14 2358  . oseltamivir (TAMIFLU) capsule 75 mg  75 mg Oral BID Thurnell Lose, MD   75 mg at 05/26/14 2147  . oxyCODONE (Oxy IR/ROXICODONE) immediate release tablet 5 mg  5 mg Oral Q4H PRN Allyne Gee, MD      . pantoprazole (PROTONIX) 80 mg in sodium chloride 0.9 % 250 mL (0.32 mg/mL) infusion  8 mg/hr Intravenous Continuous Thurnell Lose, MD      . Derrill Memo ON 05/30/2014] pantoprazole (PROTONIX) injection 40 mg  40 mg Intravenous Q12H Thurnell Lose, MD      . rosuvastatin (CRESTOR) tablet 5 mg  5 mg Oral QHS Allyne Gee, MD   5 mg at 05/26/14 2147  . thiamine (VITAMIN B-1) tablet 100 mg  100 mg Oral Daily Allyne Gee, MD   100 mg at 05/26/14 1413  . vancomycin (VANCOCIN) IVPB 1000 mg/200 mL premix  1,000 mg Intravenous Q12H Donalynn Furlong Dunbar, RPH   1,000 mg at 05/27/14 0240    Allergies as of 05/24/2014 - Review Complete 05/24/2014  Allergen Reaction Noted  . Penicillins Hives and Nausea And Vomiting 09/16/2010    Family History  Problem Relation Age of Onset  . Hypertension Father   . Lung cancer Father   . Diabetes Mother     History   Social History  . Marital Status: Divorced    Spouse Name: N/A  . Number of Children: 0  . Years of Education: 6 th   Occupational History  . Disabled    Social History Main Topics  . Smoking status: Former Research scientist (life sciences)  . Smokeless tobacco: Former Systems developer     Comment: smoked one time  . Alcohol Use: 0.0 oz/week     Comment: rare    . Drug Use: No  . Sexual Activity: Not on file   Other Topics Concern  . Not on file   Social History Narrative   Patient lives in a [phone]   Disabled.   Education- 6 th grade   Left handed.   Caffeine- coffee one cup daily.          Review of Systems: Gen:Has fever and chills CV: Denies chest pain, angina, palpitations, syncope, orthopnea, PND, peripheral edema, and claudication. Resp:Has productive cough GI: Has dysphagia to solids and liquids. Vomited blood yesterday GU : Denies urinary burning, blood in urine, urinary frequency, urinary hesitancy, nocturnal urination, and urinary incontinence. MS: has some contractures form CP  Derm: Denies rash, itching, dry skin, hives, moles, warts, or unhealing ulcers.  Psych: Denies depression, anxiety, memory loss, suicidal ideation, hallucinations, paranoia, and confusion. Heme: Denies bruising, bleeding, and enlarged lymph nodes. Neuro: hx CP Endo:  Denies any problems with DM, thyroid, adrenal function.  Physical Exam: Vital signs in last 24 hours: Temp:  [98.8 F (37.1 C)-101 F (38.3 C)] 99.5 F (37.5 C) (03/13 0606) Pulse Rate:  [81-92] 81 (03/13 0606) Resp:  [20-34] 24 (03/13 0606) BP: (107-125)/(59-65) 107/62 mmHg (03/13 0606) SpO2:  [94 %-96 %] 94 % (03/13 0606) Last BM Date: 05/24/14 General:   Alert,  Male with CP with contractures of hands, UE, in NAD Head:  Normocephalic and atraumatic. Eyes:  Sclera  clear, no icterus.  Conjunctiva pink. Ears:  Normal auditory acuity. Nose:  No deformity, discharge,  or lesions. Mouth:  No deformity or lesions.   Neck:  Supple; no masses or thyromegaly. Lungs:  Diminished BS, poor effoert . Heart:  Regular rate and rhythm Abdomen:  Soft,nontender, BS active,nonpalp mass or hsm.   Rectal:  Deferred  Pulses:  Normal pulses noted. Extremities:  Without clubbing or edema. Neurologic:  Alert and  oriented x4. Psych:  Alert and  cooperative  Intake/Output from previous day: 03/12 0701 - 03/13 0700 In: 1320 [P.O.:360; I.V.:660; IV Piggyback:300] Out: 500 [Urine:500] Intake/Output this shift:    Lab Results:  Recent Labs  05/25/14 0528 05/26/14 0950 05/27/14 0632  WBC 5.2 3.1* 3.4*  HGB 12.3* 11.8* 11.6*  HCT 36.6* 37.0* 36.0*  PLT 163 129* 129*   BMET  Recent Labs  05/25/14 0528 05/26/14 0950 05/27/14 0632  NA 142 141 138  K 3.9 3.7 3.5  CL 108 108 104  CO2 27 25 29   GLUCOSE 95 89 106*  BUN 15 12 11   CREATININE 0.68 0.63 0.51  CALCIUM 8.7 8.5 8.2*   LFT  Recent Labs  05/24/14 2120 05/25/14 0528  PROT 7.3 6.0  ALBUMIN 4.2 3.5  AST 24 20  ALT 20 16  ALKPHOS 109 83  BILITOT 0.3 0.5  BILIDIR 0.1  --   IBILI 0.2*  --       Studies/Results: Dg Chest 2 View  05/26/2014   CLINICAL DATA:  One day history of cough and fever.  EXAM: CHEST  2 VIEW  COMPARISON:  05/24/2014 dating back to 11/19/2011.  FINDINGS: Suboptimal inspiration accounts for crowded bronchovascular markings diffusely and atelectasis in the bases, and accentuates the cardiac silhouette. Taking this into account, cardiac silhouette upper normal in size to slightly enlarged. Lungs otherwise clear. No localized airspace consolidation. No pleural effusions. No pneumothorax. Normal pulmonary vascularity. Residual barium in the stomach and in the visualized colon from the swallowing function study earlier today.  IMPRESSION: Suboptimal inspiration accounts for bibasilar atelectasis. No acute cardiopulmonary disease otherwise.   Electronically Signed   By: Evangeline Dakin M.D.   On: 05/26/2014 16:22   Dg Abd Portable 1v  05/27/2014   CLINICAL DATA:  Ileus.  Coffee ground emesis.  EXAM: PORTABLE ABDOMEN - 1 VIEW  COMPARISON:  No prior abdominal exams.  FINDINGS: There is contrast throughout distal small bowel and colon from barium swallow performed 1 day prior. No bowel obstruction. Moderate stool distending the rectum. Mild gaseous  distention of small bowel loops in the central abdomen consistent with ileus. There is mild gastric gastric distension. Evaluation for radiopaque calculi is limited due to the presence of oral contrast. There is no evidence pneumatosis or free air. Dysplastic appearance of the left hip.  IMPRESSION: Findings consistent with ileus. There is oral contrast throughout the distal small bowel in colon from barium swallow performed 1 day prior, no obstruction.   Electronically Signed   By: Jeb Levering M.D.   On: 05/27/2014 01:48   Dg Swallowing Func-speech Pathology  05/26/2014   Lorre Nick, CCC-SLP     05/26/2014  1:16 PM  Objective Swallowing Evaluation: Modified Barium Swallowing Study   Patient Details  Name: Thomas Ramos: [id number] Date of Birth: 04/18/1970  Today's Date: 05/26/2014 Time: SLP Start Time (ACUTE ONLY): 1215-SLP Stop Time (ACUTE  ONLY): 1245 SLP Time Calculation (min) (ACUTE ONLY): 30 min  Past Medical History:  Past Medical History  Diagnosis Date  . Motility disorder, esophageal   . Esophageal stricture   . Hypertension   . Pneumonia   . GERD (gastroesophageal reflux disease)   . CP (cerebral palsy)   . S/P Botox injection     approx every 4 months  . Seasonal allergies   . Environmental allergies     takes inhalers if needed  . Quadriplegic spinal paralysis   . Cerebral palsy    Past Surgical History:  Past Surgical History  Procedure Laterality Date  . Tendon release    . Mouth surgery    . Esophagus surgery      stretched esophagus  . Legs    . Esophagogastroduodenoscopy N/A 12/21/2012    Procedure: ESOPHAGOGASTRODUODENOSCOPY (EGD);  Surgeon: Inda Castle, MD;  Location: [facility];  Service: Endoscopy;   Laterality: N/A;  . Botox injection N/A 12/21/2012    Procedure: BOTOX INJECTION;  Surgeon: Inda Castle, MD;   Location: [facility];  Service: Endoscopy;  Laterality: N/A;   HPI:  HPI: SRIHAAN MASTRANGELO is a 44 year old male who presents with cough  and pneumonia. Patient states  that he has been having episode of  choking and gagging. He states that anytime he eats or drinks he  has noted that he chokes and that he has reportedly had vomiting.  He feels as though it goes down his lungs. He has had fevers also  noted in the ED. In addition he also has a history of achalasia  and has had attempts at stretching his esophagus in the past. BSE  completed, with recommendation to proceed with MBS.  No Data Recorded  Assessment / Plan / Recommendation CHL IP CLINICAL IMPRESSIONS 05/26/2014  Dysphagia Diagnosis (None)  Clinical impression Mild oropharyngeal dysphagia, characterized  by excessive secretions orally, but otherwise functional oral  phase. No anterior leakage of any consistency noted.  Pharyngeally, Vallecular post-swallow residue was noted across  consistencies due to decreased tongue base retraction. Dry  swallow effectively cleared residue. Trace flash penetration of  thin liquids was seen intermittently, during the swallow due to  decreased airway closure, and after the swallow due to vallecular  residue. Pt was able to follow commands to clear throat to remove  penetrate. Puree, cracker, and barium tablet (given with water)  were tolerated without penetration or aspiration, but with trace  vallecular residue.  Dry swallow was effective to reduce  post-swallow residue. Will begin conservative diet of dys 2/thin  liquids with cues to swallow 2x, clear throat intermittently, and  remain upright 30 minutes after eating/drinking. ST to follow for  diet tolerance and education.      CHL IP TREATMENT RECOMMENDATION 05/26/2014  Treatment Plan Recommendations Therapy as outlined in treatment  plan below     CHL IP DIET RECOMMENDATION 05/26/2014  Diet Recommendations Dysphagia 2 (Fine chop);Thin liquid  Liquid Administration via (None)  Medication Administration (None)  Compensations (None)  Postural Changes and/or Swallow Maneuvers (None)     CHL IP OTHER RECOMMENDATIONS 05/26/2014  Recommended  Consults (None)  Oral Care Recommendations Oral care Q4 per protocol  Other Recommendations Clarify dietary restrictions     CHL IP FOLLOW UP RECOMMENDATIONS 05/26/2014 Follow up  Recommendations 24 hour supervision/assistance     CHL IP FREQUENCY AND DURATION 05/26/2014  Speech Therapy Frequency (ACUTE ONLY) min 2x/week  Treatment Duration 1 week     Pertinent Vitals/Pain No pain reported    SLP Swallow Goals No flowsheet data found.  No flowsheet data found.    CHL IP REASON FOR REFERRAL 05/26/2014  Reason for Referral Objectively evaluate swallowing function     CHL IP ORAL PHASE 05/26/2014  Lips (None)  Tongue (None)  Mucous membranes (None)  Nutritional status (None)  Other (None)  Oxygen therapy (None)  Oral Phase Impaired  Oral - Pudding Teaspoon (None)  Oral - Pudding Cup (None)  Oral - Honey Teaspoon (None)  Oral - Honey Cup (None)  Oral - Honey Syringe (None)  Oral - Nectar Teaspoon (None)  Oral - Nectar Cup (None)  Oral - Nectar Straw (None)  Oral - Nectar Syringe (None)  Oral - Ice Chips (None)  Oral - Thin Teaspoon (None)  Oral - Thin Cup (None)  Oral - Thin Straw (None)  Oral - Thin Syringe (None)  Oral - Puree (None)  Oral - Mechanical Soft (None)  Oral - Regular (None) Oral - Multi-consistency (None)  Oral - Pill (None)  Oral Phase - Comment Excessive oral secretions, otherwise  functional oral phase      CHL IP PHARYNGEAL PHASE 05/26/2014  Pharyngeal Phase Impaired  Pharyngeal - Pudding Teaspoon (None)  Penetration/Aspiration details (pudding teaspoon) (None)  Pharyngeal - Pudding Cup (None)  Penetration/Aspiration details (pudding cup) (None)  Pharyngeal - Honey Teaspoon (None)  Penetration/Aspiration details (honey teaspoon) (None)  Pharyngeal - Honey Cup (None)  Penetration/Aspiration details (honey cup) (None)  Pharyngeal - Honey Syringe (None)  Penetration/Aspiration details (honey syringe) (None)  Pharyngeal - Nectar Teaspoon (None)  Penetration/Aspiration details (nectar teaspoon) (None)   Pharyngeal - Nectar Cup (None)  Penetration/Aspiration details (nectar cup) (None)  Pharyngeal - Nectar Straw Reduced tongue base  retraction;Pharyngeal residue - valleculae  Penetration/Aspiration details (nectar straw) (None)  Pharyngeal - Nectar Syringe (None)  Penetration/Aspiration details (nectar syringe) (None)  Pharyngeal - Ice Chips (None)  Penetration/Aspiration details (ice chips) (None)  Pharyngeal - Thin Teaspoon (None)  Penetration/Aspiration details (thin teaspoon) (None)  Pharyngeal - Thin Cup (None)  Penetration/Aspiration details (thin cup) (None)  Pharyngeal - Thin Straw Reduced tongue base retraction;Pharyngeal  residue - valleculae;Reduced airway/laryngeal  closure;Penetration/Aspiration after  swallow;Penetration/Aspiration during swallow  Penetration/Aspiration details (thin straw) Material enters  airway, remains ABOVE vocal cords then ejected out;Material does  not enter airway  Pharyngeal - Thin Syringe (None)  Penetration/Aspiration details (thin syringe') (None)  Pharyngeal - Puree Reduced tongue base retraction;Pharyngeal  residue - valleculae  Penetration/Aspiration details (puree) (None)  Pharyngeal - Mechanical Soft (None)  Penetration/Aspiration details (mechanical soft) (None)  Pharyngeal - Regular (None)  Penetration/Aspiration details (regular) (None)  Pharyngeal - Multi-consistency Reduced tongue base  retraction;Pharyngeal residue - valleculae  Penetration/Aspiration details (multi-consistency) (None)  Pharyngeal - Pill Reduced pharyngeal peristalsis;Pharyngeal  residue - valleculae  Penetration/Aspiration details (pill) (None)  Pharyngeal Comment (None)     CHL IP CERVICAL ESOPHAGEAL PHASE 05/26/2014  Cervical Esophageal Phase WFL  Pudding Teaspoon (None)  Pudding Cup (None)  Honey Teaspoon (None)  Honey Cup (None)  Honey Syringe (None)  Nectar Teaspoon (None)  Nectar Cup (None)  Nectar Straw (None)  Nectar Syringe (None)  Thin Teaspoon (None)  Thin Cup (None)  Thin Straw (None)   Thin Syringe (None)  Cervical Esophageal Comment (None)    No flowsheet data found.  Celia B. Quentin Ore [facility], CCC-SLP [phone]       Shonna Chock 05/26/2014, 1:15 PM     IMPRESSION/PLAN: #1. Influenza a period on Tamiflu. Has less body aches. #2. Pneumonia. Currently on vancomycin and meropenum. #3. History dysphagia and achalasia.  Patient has had Botox injections in the past with resolution of his dysphagia. He has been experiencing progressive dysphagia for the past 1-2 months. Will review with Dr. Hilarie Fredrickson as to whether or not patient may be a candidate for EGD with Botox once pneumonia resolved. #4. Hematemesis. Patient may have a small Mallory-Weiss tear from retching. As per #3, respiratory status needs to be improved before proceeding with EGD. #5. Ileus. Patient is passing gas and has no abdominal pain.   Zeniah Briney, Deloris Ping 05/27/2014,  Pager [phone]

## 2014-05-27 NOTE — Plan of Care (Signed)
Problem: Phase I Progression Outcomes Goal: OOB as tolerated unless otherwise ordered Outcome: Not Met (add Reason) Patient parapalegic at baseline  Problem: Phase II Progression Outcomes Goal: Progress activity as tolerated unless otherwise ordered Outcome: Not Met (add Reason) Patient quadrepeligic at baseline

## 2014-05-28 LAB — BASIC METABOLIC PANEL
ANION GAP: 8 (ref 5–15)
BUN: <5 mg/dL — ABNORMAL LOW (ref 6–23)
CHLORIDE: 104 mmol/L (ref 96–112)
CO2: 27 mmol/L (ref 19–32)
Calcium: 8.3 mg/dL — ABNORMAL LOW (ref 8.4–10.5)
Creatinine, Ser: 0.54 mg/dL (ref 0.50–1.35)
GFR calc non Af Amer: >90 mL/min (ref >90)
Glucose, Bld: 94 mg/dL (ref 70–99)
POTASSIUM: 3.7 mmol/L (ref 3.5–5.1)
SODIUM: 139 mmol/L (ref 135–145)

## 2014-05-28 LAB — CBC
HEMATOCRIT: 37.7 % — AB (ref 39.0–52.0)
Hemoglobin: 12.2 g/dL — ABNORMAL LOW (ref 13.0–17.0)
MCH: 27.6 pg (ref 26.0–34.0)
MCHC: 32.4 g/dL (ref 30.0–36.0)
MCV: 85.3 fL (ref 78.0–100.0)
PLATELETS: 121 10*3/uL — AB (ref 150–400)
RBC: 4.42 MIL/uL (ref 4.22–5.81)
RDW: 12.9 % (ref 11.5–15.5)
WBC: 2.6 10*3/uL — ABNORMAL LOW (ref 4.0–10.5)

## 2014-05-28 LAB — GLUCOSE, CAPILLARY
GLUCOSE-CAPILLARY: 101 mg/dL — AB (ref 70–99)
Glucose-Capillary: 103 mg/dL — ABNORMAL HIGH (ref 70–99)
Glucose-Capillary: 103 mg/dL — ABNORMAL HIGH (ref 70–99)

## 2014-05-28 MED ORDER — LEVOFLOXACIN 500 MG PO TABS: 500.0000 mg | ORAL_TABLET | Freq: Every day | ORAL | Status: DC

## 2014-05-28 MED ORDER — METRONIDAZOLE 500 MG PO TABS: 500.0000 mg | ORAL_TABLET | Freq: Three times a day (TID) | ORAL | Status: DC

## 2014-05-28 MED ORDER — OMEPRAZOLE 40 MG PO CPDR: 40.0000 mg | DELAYED_RELEASE_CAPSULE | Freq: Two times a day (BID) | ORAL | Status: DC

## 2014-05-28 MED ORDER — OSELTAMIVIR PHOSPHATE 75 MG PO CAPS: 75.0000 mg | ORAL_CAPSULE | Freq: Two times a day (BID) | ORAL | Status: DC

## 2014-05-28 NOTE — Progress Notes (Signed)
Pt left facility per EMS. D/c'd IV lines. Pt in good condition.

## 2014-05-28 NOTE — Clinical Social Work Note (Signed)
Per treatment team, patient plans to return to his alternative family home today to stay with his caretaker West Carbo. Plan has been confirmed with [facility] Car[facility]. [facility] has requested DC Summary, PT, OT, ST notes, and new scripts. DC packet placed on patient's chart.

## 2014-05-28 NOTE — Clinical Social Work Note (Signed)
Per MD patient ready to DC back to his alternative family home.. RN, patient/family, and facility notified [facility] Carbo [phone] and Lacretia Nicks [phone]) of patient's DC. RN given number for report. DC packet on patient's chart. Ambulance transport will be requested for patient by RN once oxygen (CSW has instructed the home to call once oxygen has arrived) has been delivered to the home (Ambulance transport numbers listed on discharge packet with shadow chart). CSW signing off at this time.   Liz Beach MSW, [place], [place], [phone]

## 2014-05-28 NOTE — Care Management Note (Signed)
    Page 1 of 1   05/28/2014     3:58:17 PM CARE MANAGEMENT NOTE 05/28/2014  Patient:  Thomas Ramos, Thomas Ramos   Account Number:  [id number]  Date Initiated:  05/25/2014  Documentation initiated by:  Sandi Mariscal  Subjective/Objective Assessment:   complex PMH: adm w/ aspiration PNA--witnessed aspiration but no infiltrates on CXR     Action/Plan:   await recovery to determine if back to home is safest option   Anticipated DC Date:  05/28/2014   Anticipated DC Plan:  [place]  CM consult      [facility] Choice  HOME HEALTH   Choice offered to / List presented to:  C-2 HC POA / Guardian        HH arranged  [place].   Status of service:  Completed, signed off Medicare Important Message given?  YES (If response is "NO", the following Medicare IM given date fields will be blank) Date Medicare IM given:  05/28/2014 Medicare IM given by:  Tomi Bamberger Date Additional Medicare IM given:   Additional Medicare IM given by:    Discharge Disposition:  Agenda  Per UR Regulation:  Reviewed for med. necessity/level of care/duration of stay  If discussed at [place] of Stay Meetings, dates discussed:    Comments:  05/28/14 [place] [phone] patient for dc today, NCM spoke with [facility] Carbo at  [phone] ,he states patient already has a Therapist, sports and an aide but will need st therapy,  he chose [facility] , referral made to [facility], [place] notified. Soc will begin 24-48 hrs post dc.

## 2014-05-28 NOTE — Progress Notes (Signed)
NCM  Spoke with Frederico Hamman he will check patients room air sats at rest and call me back.

## 2014-05-28 NOTE — Progress Notes (Signed)
Speech Language Pathology Treatment: Dysphagia  Patient Details Name: Thomas Ramos MRN: [id number] DOB: [birth year] Today's Date: 05/28/2014 Time: 2482-5003 SLP Time Calculation (min) (ACUTE ONLY): 22 min  Assessment / Plan / Recommendation Clinical Impression  F/u diet tolerance assessment revealed overall oropharyngeal swallow function consistent with MBS results from 3/13. One time immediate cough response noted post swallow with thin liquids, suspect delayed swallow initiation due to sub optimal positioning despite max clinician assistance. Caregiver present and educated regarding results of MBS, diet recommendations, and use of compensatory strategies which patient currently requires moderate verbal cueing to successfully utilize. SLP will continue to f/u to encourage use of strategies, mitigating risk of aspiration.    HPI HPI: Thomas Ramos is a 44 year old male who presents with cough and pneumonia. Patient states that he has been having episode of choking and gagging. He states that anytime he eats or drinks he has noted that he chokes and that he has reportedly had vomiting. He feels as though it goes down his lungs. He has had fevers also noted in the ED. In addition he also has a history of achalasia and has had attempts at stretching his esophagus in the past. BSE completed, with recommendation to proceed with MBS.   Pertinent Vitals Pain Assessment: No/denies pain  SLP Plan  Continue with current plan of care    Recommendations Diet recommendations: Dysphagia 2 (fine chop);Thin liquid Liquids provided via: Straw Medication Administration: Whole meds with liquid Supervision: Full supervision/cueing for compensatory strategies;Trained caregiver to feed patient Compensations: Small sips/bites;Multiple dry swallows after each bite/sip;Clear throat intermittently Postural Changes and/or Swallow Maneuvers: Seated upright 90 degrees;Upright 30-60 min after meal              Oral Care  Recommendations: Oral care BID Follow up Recommendations: None Plan: Continue with current plan of care    [place], [place] [phone]  Cathline Dowen Meryl 05/28/2014, 9:39 AM

## 2014-05-28 NOTE — Progress Notes (Signed)
Medicare Important Message given?  YES (If response is "NO", the following Medicare IM given date fields will be blank) Date Medicare IM given:05/28/14 Medicare IM given by:  Eliah Ozawa 

## 2014-05-28 NOTE — Discharge Instructions (Signed)
Follow with Primary MD SPENCER,SARA C, PA-C in 7 days   Get CBC, CMP, 2 view Chest X ray checked  by Primary MD next visit.    Activity: As tolerated with Full fall precautions use walker/cane & assistance as needed   Disposition group home   Diet: Dysphagia 2 diet with feeding assistance and aspiration precautions .  For Heart failure patients - Check your Weight same time everyday, if you gain over 2 pounds, or you develop in leg swelling, experience more shortness of breath or chest pain, call your Primary MD immediately. Follow Cardiac Low Salt Diet and 1.5 lit/day fluid restriction.   On your next visit with your primary care physician please Get Medicines reviewed and adjusted.   Please request your Prim.MD to go over all Hospital Tests and Procedure/Radiological results at the follow up, please get all Hospital records sent to your Prim MD by signing hospital release before you go home.   If you experience worsening of your admission symptoms, develop shortness of breath, life threatening emergency, suicidal or homicidal thoughts you must seek medical attention immediately by calling 911 or calling your MD immediately  if symptoms less severe.  You Must read complete instructions/literature along with all the possible adverse reactions/side effects for all the Medicines you take and that have been prescribed to you. Take any new Medicines after you have completely understood and accpet all the possible adverse reactions/side effects.   Do not drive, operating heavy machinery, perform activities at heights, swimming or participation in water activities or provide baby sitting services if your were admitted for syncope or siezures until you have seen by Primary MD or a Neurologist and advised to do so again.  Do not drive when taking Pain medications.    Do not take more than prescribed Pain, Sleep and Anxiety Medications  Special Instructions: If you have smoked or chewed  Tobacco  in the last 2 yrs please stop smoking, stop any regular Alcohol  and or any Recreational drug use.  Wear Seat belts while driving.   Please note  You were cared for by a hospitalist during your hospital stay. If you have any questions about your discharge medications or the care you received while you were in the hospital after you are discharged, you can call the unit and asked to speak with the hospitalist on call if the hospitalist that took care of you is not available. Once you are discharged, your primary care physician will handle any further medical issues. Please note that NO REFILLS for any discharge medications will be authorized once you are discharged, as it is imperative that you return to your primary care physician (or establish a relationship with a primary care physician if you do not have one) for your aftercare needs so that they can reassess your need for medications and monitor your lab values.                                                       Bretton Tandy was admitted to the Hospital on 05/24/2014 and Discharged  05/28/2014 and should be excused from work/school   for 7  days starting 05/24/2014 , may return to work/school without any restrictions.  Call Lala Lund MD, [facility]  [phone] with questions.  Thurnell Lose M.D on 05/28/2014,at 10:48 AM  [facility]   Office  [phone]

## 2014-05-28 NOTE — Discharge Summary (Signed)
Thomas Ramos, is a 44 y.o. male  DOB 1970-07-14  MRN [id number].  Admission date:  05/24/2014  Admitting Physician  Allyne Gee, MD  Discharge Date:  05/28/2014   Primary MD  Aura Dials, PA-C  Recommendations for primary care physician for things to follow:   Check CBC, BMP and a 2 view chest x-ray in 3-5 days.  Needs continued follow-up with GI and speech therapy   Admission Diagnosis  SOB (shortness of breath) [R06.02] Aspiration pneumonia, unspecified aspiration pneumonia type [J69.0]   Discharge Diagnosis  SOB (shortness of breath) [R06.02] Aspiration pneumonia, unspecified aspiration pneumonia type [J69.0]     Active Problems:   Aspiration pneumonia   HTN (hypertension)   Fever   Hematemesis with nausea   Influenza with pneumonia      Past Medical History  Diagnosis Date  . Motility disorder, esophageal   . Esophageal stricture   . Hypertension   . Pneumonia   . GERD (gastroesophageal reflux disease)   . CP (cerebral palsy)   . S/P Botox injection     approx every 4 months  . Seasonal allergies   . Environmental allergies     takes inhalers if needed  . Quadriplegic spinal paralysis   . Cerebral palsy     Past Surgical History  Procedure Laterality Date  . Tendon release    . Mouth surgery    . Esophagus surgery      stretched esophagus  . Legs    . Esophagogastroduodenoscopy N/A 12/21/2012    Procedure: ESOPHAGOGASTRODUODENOSCOPY (EGD);  Surgeon: Inda Castle, MD;  Location: [facility];  Service: Endoscopy;  Laterality: N/A;  . Botox injection N/A 12/21/2012    Procedure: BOTOX INJECTION;  Surgeon: Inda Castle, MD;  Location: [facility];  Service: Endoscopy;  Laterality: N/A;       History of present illness and  Hospital Course:     Kindly see H&P for history  of present illness and admission details, please review complete Labs, Consult reports and Test reports for all details in brief  HPI  from the history and physical done on the day of admission  Thomas Ramos is a 44 y.o. male presents with cough and pneumonia. Patient states that he has been having episode of choking and gagging. He states that anytime he eats or drinks he has noted that he chokes. He also states that he has had vomiting since he has been here. He feels as though it goes down his lungs. He has had fevers also noted in the ED. Patient has no shortness of breath noted. He has no chest pain. He has had prior history of pneumonia. In addition he has been having back pain which sounds like it is more chronic. He staets that he does get periodic spasms in his llimbs due to contractures. He has received Botox in the past to relieve spasm. In addition he also has a history of achalasia and has had attempts at stretching his esophagus in the past.  Hospital Course   1. Fever, body aches due to influenza. With nausea vomiting . Nausea vomiting Due to influenza infection along with secondary aspiration pneumonia, seen by GI and speech, on dysphagia 2 diet kindly see dietary instructions below in discharge instructions, no further treatment for GI. Will be placed on appropriate antibiotic along with Tamiflu, will follow with GI in the office as he has history of achalasia. He will also get home speech therapy and RN upon discharge.    2. History of cerebral palsy. Lives in group home. Supportive care to be continued.    3.H/O Dysphagia and Achalasia - Speech saw the patient diet as per discharge instructions below, B swallow has been done, had developed nausea vomiting on 05/27/2014 with some hematemesis therefore now nothing by mouth, seen by GI likely small Mallory-Weiss tear due to nausea vomiting, placed on PPI no further episodes of nausea vomiting or hematemesis, H&H stable. Will follow  with GI in the outpatient setting post discharge.    4.Dyslipidemia - on statin     5.HTN - continue Norvasc    6. Nausea vomiting with mild hematemesis on 05/27/2014. Likely small Mallory-Weiss tear, hold NSAIDs, aspirin, placed on twice a day PPI. Stable H&H, in by GI no further inpatient treatment. Symptoms resolved and follow with GI in the outpatient setting post discharge.     Discharge Condition: Stable   Follow UP  Follow-up Information    Follow up with SPENCER,SARA C, PA-C. Schedule an appointment as soon as possible for a visit in 1 week.   Specialty:  Physician Assistant   Contact information:   [place] [address] [phone]       Follow up with Jerene Bears, MD. Schedule an appointment as soon as possible for a visit in 1 week.   Specialty:  Gastroenterology   Why:  Dysphagia, achalasia   Contact information:   [address] [place] [address] [phone]         Discharge Instructions  and  Discharge Medications          Discharge Instructions    Discharge instructions    Complete by:  As directed   Follow with Primary MD SPENCER,SARA C, PA-C in 7 days   Get CBC, CMP, 2 view Chest X ray checked  by Primary MD next visit.    Activity: As tolerated with Full fall precautions use walker/cane & assistance as needed   Disposition group home   Diet: Dysphagia 2 diet with feeding assistance and aspiration precautions .  For Heart failure patients - Check your Weight same time everyday, if you gain over 2 pounds, or you develop in leg swelling, experience more shortness of breath or chest pain, call your Primary MD immediately. Follow Cardiac Low Salt Diet and 1.5 lit/day fluid restriction.   On your next visit with your primary care physician please Get Medicines reviewed and adjusted.   Please request your Prim.MD to go over all Hospital Tests and Procedure/Radiological results at the follow up, please get all  Hospital records sent to your Prim MD by signing hospital release before you go home.   If you experience worsening of your admission symptoms, develop shortness of breath, life threatening emergency, suicidal or homicidal thoughts you must seek medical attention immediately by calling 911 or calling your MD immediately  if symptoms less severe.  You Must read complete instructions/literature along with all the possible adverse reactions/side effects for all the Medicines you  take and that have been prescribed to you. Take any new Medicines after you have completely understood and accpet all the possible adverse reactions/side effects.   Do not drive, operating heavy machinery, perform activities at heights, swimming or participation in water activities or provide baby sitting services if your were admitted for syncope or siezures until you have seen by Primary MD or a Neurologist and advised to do so again.  Do not drive when taking Pain medications.    Do not take more than prescribed Pain, Sleep and Anxiety Medications  Special Instructions: If you have smoked or chewed Tobacco  in the last 2 yrs please stop smoking, stop any regular Alcohol  and or any Recreational drug use.  Wear Seat belts while driving.   Please note  You were cared for by a hospitalist during your hospital stay. If you have any questions about your discharge medications or the care you received while you were in the hospital after you are discharged, you can call the unit and asked to speak with the hospitalist on call if the hospitalist that took care of you is not available. Once you are discharged, your primary care physician will handle any further medical issues. Please note that NO REFILLS for any discharge medications will be authorized once you are discharged, as it is imperative that you return to your primary care physician (or establish a relationship with a primary care physician if you do not have one) for  your aftercare needs so that they can reassess your need for medications and monitor your lab values.                                                       Even Budlong was admitted to the Hospital on 05/24/2014 and Discharged  05/28/2014 and should be excused from work/school   for 7  days starting 05/24/2014 , may return to work/school without any restrictions.  Call Lala Lund MD, [facility]  [phone] with questions.  Thurnell Lose M.D on 05/28/2014,at 10:48 AM  [facility]   Office  [phone]     Increase activity slowly    Complete by:  As directed             Medication List    STOP taking these medications        hydrochlorothiazide 25 MG tablet  Commonly known as:  HYDRODIURIL     meloxicam 7.5 MG tablet  Commonly known as:  MOBIC     ranitidine 150 MG tablet  Commonly known as:  ZANTAC      TAKE these medications        amLODipine 5 MG tablet  Commonly known as:  NORVASC  Take 5 mg by mouth every evening.     cetirizine 10 MG tablet  Commonly known as:  ZYRTEC  Take 10 mg by mouth daily.     citalopram 20 MG tablet  Commonly known as:  CELEXA  Take 20 mg by mouth every evening.     CRESTOR 5 MG tablet  Generic drug:  rosuvastatin  Take 5 mg by mouth at bedtime.     econazole nitrate 1 % cream  Apply 1 application topically daily.     Fish Oil 1000 MG Caps  Take 1 capsule by mouth daily.  fluticasone 50 MCG/ACT nasal spray  Commonly known as:  FLONASE  Place 1 spray into both nostrils daily.     guaiFENesin 600 MG 12 hr tablet  Commonly known as:  MUCINEX  Take 600 mg by mouth 2 (two) times daily.     levofloxacin 500 MG tablet  Commonly known as:  LEVAQUIN  Take 1 tablet (500 mg total) by mouth daily.     metroNIDAZOLE 500 MG tablet  Commonly known as:  FLAGYL  Take 1 tablet (500 mg total) by mouth 3 (three) times daily.     montelukast 10 MG tablet  Commonly known as:  SINGULAIR  Take 10 mg by mouth  at bedtime.     multivitamin with minerals Tabs tablet  Take 1 tablet by mouth daily.     omeprazole 40 MG capsule  Commonly known as:  PRILOSEC  Take 1 capsule (40 mg total) by mouth 2 (two) times daily before a meal.     oseltamivir 75 MG capsule  Commonly known as:  TAMIFLU  Take 1 capsule (75 mg total) by mouth 2 (two) times daily.     oxyCODONE-acetaminophen 5-325 MG per tablet  Commonly known as:  PERCOCET/ROXICET  Take 1-2 pills every 4-6 hours as needed for pain.     PROAIR HFA 108 (90 BASE) MCG/ACT inhaler  Generic drug:  albuterol  Inhale 2 puffs into the lungs every 6 (six) hours as needed for wheezing or shortness of breath.     propranolol 40 MG tablet  Commonly known as:  INDERAL  Take 40 mg by mouth 3 (three) times daily.     psyllium 0.52 G capsule  Commonly known as:  REGULOID  Take 0.52 g by mouth daily.          Diet and Activity recommendation: See Discharge Instructions above   Consults obtained - Speech, GI   Major procedures and Radiology Reports - PLEASE review detailed and final reports for all details, in brief -       Dg Chest 2 View  05/26/2014   CLINICAL DATA:  One day history of cough and fever.  EXAM: CHEST  2 VIEW  COMPARISON:  05/24/2014 dating back to 11/19/2011.  FINDINGS: Suboptimal inspiration accounts for crowded bronchovascular markings diffusely and atelectasis in the bases, and accentuates the cardiac silhouette. Taking this into account, cardiac silhouette upper normal in size to slightly enlarged. Lungs otherwise clear. No localized airspace consolidation. No pleural effusions. No pneumothorax. Normal pulmonary vascularity. Residual barium in the stomach and in the visualized colon from the swallowing function study earlier today.  IMPRESSION: Suboptimal inspiration accounts for bibasilar atelectasis. No acute cardiopulmonary disease otherwise.   Electronically Signed   By: Evangeline Dakin M.D.   On: 05/26/2014 16:22   Dg  Chest Port 1 View  05/24/2014   CLINICAL DATA:  Shortness of breath for 1 month. Patient is at risk for checking in aspiration. History of esophageal motility disorder, cerebral palsy, pneumonia, esophageal stricture, quadriplegic spinal paralysis. Nonsmoker.  EXAM: PORTABLE CHEST - 1 VIEW  COMPARISON:  07/19/2013  FINDINGS: Examination is technically limited due to patient positioning and rotation. Suggestion of thoracic kyphosis. Shallow inspiration. Heart size and pulmonary vascularity are normal. No focal airspace disease or consolidation seen in the lungs. No pneumothorax. No blunting of costophrenic angles.  IMPRESSION: Shallow inspiration.  No evidence of active pulmonary disease.   Electronically Signed   By: Lucienne Capers M.D.   On: 05/24/2014 21:40   Dg  Abd Portable 1v  05/27/2014   CLINICAL DATA:  Ileus.  Coffee ground emesis.  EXAM: PORTABLE ABDOMEN - 1 VIEW  COMPARISON:  No prior abdominal exams.  FINDINGS: There is contrast throughout distal small bowel and colon from barium swallow performed 1 day prior. No bowel obstruction. Moderate stool distending the rectum. Mild gaseous distention of small bowel loops in the central abdomen consistent with ileus. There is mild gastric gastric distension. Evaluation for radiopaque calculi is limited due to the presence of oral contrast. There is no evidence pneumatosis or free air. Dysplastic appearance of the left hip.  IMPRESSION: Findings consistent with ileus. There is oral contrast throughout the distal small bowel in colon from barium swallow performed 1 day prior, no obstruction.   Electronically Signed   By: Jeb Levering M.D.   On: 05/27/2014 01:48   Dg Swallowing Func-speech Pathology  05/26/2014   Lorre Nick, CCC-SLP     05/26/2014  1:16 PM  Objective Swallowing Evaluation: Modified Barium Swallowing Study   Patient Details  Name: Thomas Ramos MRN: [id number] Date of Birth: Nov 19, 1970  Today's Date: 05/26/2014 Time: SLP Start Time (ACUTE  ONLY): 1215-SLP Stop Time (ACUTE  ONLY): 1245 SLP Time Calculation (min) (ACUTE ONLY): 30 min  Past Medical History:  Past Medical History  Diagnosis Date  . Motility disorder, esophageal   . Esophageal stricture   . Hypertension   . Pneumonia   . GERD (gastroesophageal reflux disease)   . CP (cerebral palsy)   . S/P Botox injection     approx every 4 months  . Seasonal allergies   . Environmental allergies     takes inhalers if needed  . Quadriplegic spinal paralysis   . Cerebral palsy    Past Surgical History:  Past Surgical History  Procedure Laterality Date  . Tendon release    . Mouth surgery    . Esophagus surgery      stretched esophagus  . Legs    . Esophagogastroduodenoscopy N/A 12/21/2012    Procedure: ESOPHAGOGASTRODUODENOSCOPY (EGD);  Surgeon: Inda Castle, MD;  Location: [facility];  Service: Endoscopy;   Laterality: N/A;  . Botox injection N/A 12/21/2012    Procedure: BOTOX INJECTION;  Surgeon: Inda Castle, MD;   Location: [facility];  Service: Endoscopy;  Laterality: N/A;   HPI:  HPI: Thomas Ramos is a 44 year old male who presents with cough  and pneumonia. Patient states that he has been having episode of  choking and gagging. He states that anytime he eats or drinks he  has noted that he chokes and that he has reportedly had vomiting.  He feels as though it goes down his lungs. He has had fevers also  noted in the ED. In addition he also has a history of achalasia  and has had attempts at stretching his esophagus in the past. BSE  completed, with recommendation to proceed with MBS.  No Data Recorded  Assessment / Plan / Recommendation CHL IP CLINICAL IMPRESSIONS 05/26/2014  Dysphagia Diagnosis (None)  Clinical impression Mild oropharyngeal dysphagia, characterized  by excessive secretions orally, but otherwise functional oral  phase. No anterior leakage of any consistency noted.  Pharyngeally, Vallecular post-swallow residue was noted across  consistencies due to decreased tongue base  retraction. Dry  swallow effectively cleared residue. Trace flash penetration of  thin liquids was seen intermittently, during the swallow due to  decreased airway closure, and after the swallow due to vallecular  residue. Pt  was able to follow commands to clear throat to remove  penetrate. Puree, cracker, and barium tablet (given with water)  were tolerated without penetration or aspiration, but with trace  vallecular residue.  Dry swallow was effective to reduce  post-swallow residue. Will begin conservative diet of dys 2/thin  liquids with cues to swallow 2x, clear throat intermittently, and  remain upright 30 minutes after eating/drinking. ST to follow for  diet tolerance and education.      CHL IP TREATMENT RECOMMENDATION 05/26/2014  Treatment Plan Recommendations Therapy as outlined in treatment  plan below     CHL IP DIET RECOMMENDATION 05/26/2014  Diet Recommendations Dysphagia 2 (Fine chop);Thin liquid  Liquid Administration via (None)  Medication Administration (None)  Compensations (None)  Postural Changes and/or Swallow Maneuvers (None)     CHL IP OTHER RECOMMENDATIONS 05/26/2014  Recommended Consults (None)  Oral Care Recommendations Oral care Q4 per protocol  Other Recommendations Clarify dietary restrictions     CHL IP FOLLOW UP RECOMMENDATIONS 05/26/2014 Follow up  Recommendations 24 hour supervision/assistance     CHL IP FREQUENCY AND DURATION 05/26/2014  Speech Therapy Frequency (ACUTE ONLY) min 2x/week  Treatment Duration 1 week     Pertinent Vitals/Pain No pain reported    SLP Swallow Goals No flowsheet data found.  No flowsheet data found.    CHL IP REASON FOR REFERRAL 05/26/2014  Reason for Referral Objectively evaluate swallowing function     CHL IP ORAL PHASE 05/26/2014  Lips (None)  Tongue (None)  Mucous membranes (None)  Nutritional status (None)  Other (None)  Oxygen therapy (None)  Oral Phase Impaired  Oral - Pudding Teaspoon (None)  Oral - Pudding Cup (None)  Oral - Honey Teaspoon (None)  Oral  - Honey Cup (None)  Oral - Honey Syringe (None)  Oral - Nectar Teaspoon (None)  Oral - Nectar Cup (None)  Oral - Nectar Straw (None)  Oral - Nectar Syringe (None)  Oral - Ice Chips (None)  Oral - Thin Teaspoon (None)  Oral - Thin Cup (None)  Oral - Thin Straw (None)  Oral - Thin Syringe (None)  Oral - Puree (None)  Oral - Mechanical Soft (None)  Oral - Regular (None) Oral - Multi-consistency (None)  Oral - Pill (None)  Oral Phase - Comment Excessive oral secretions, otherwise  functional oral phase      CHL IP PHARYNGEAL PHASE 05/26/2014  Pharyngeal Phase Impaired  Pharyngeal - Pudding Teaspoon (None)  Penetration/Aspiration details (pudding teaspoon) (None)  Pharyngeal - Pudding Cup (None)  Penetration/Aspiration details (pudding cup) (None)  Pharyngeal - Honey Teaspoon (None)  Penetration/Aspiration details (honey teaspoon) (None)  Pharyngeal - Honey Cup (None)  Penetration/Aspiration details (honey cup) (None)  Pharyngeal - Honey Syringe (None)  Penetration/Aspiration details (honey syringe) (None)  Pharyngeal - Nectar Teaspoon (None)  Penetration/Aspiration details (nectar teaspoon) (None)  Pharyngeal - Nectar Cup (None)  Penetration/Aspiration details (nectar cup) (None)  Pharyngeal - Nectar Straw Reduced tongue base  retraction;Pharyngeal residue - valleculae  Penetration/Aspiration details (nectar straw) (None)  Pharyngeal - Nectar Syringe (None)  Penetration/Aspiration details (nectar syringe) (None)  Pharyngeal - Ice Chips (None)  Penetration/Aspiration details (ice chips) (None)  Pharyngeal - Thin Teaspoon (None)  Penetration/Aspiration details (thin teaspoon) (None)  Pharyngeal - Thin Cup (None)  Penetration/Aspiration details (thin cup) (None)  Pharyngeal - Thin Straw Reduced tongue base retraction;Pharyngeal  residue - valleculae;Reduced airway/laryngeal  closure;Penetration/Aspiration after  swallow;Penetration/Aspiration during swallow  Penetration/Aspiration details (thin straw) Material enters   airway, remains ABOVE vocal  cords then ejected out;Material does  not enter airway  Pharyngeal - Thin Syringe (None)  Penetration/Aspiration details (thin syringe') (None)  Pharyngeal - Puree Reduced tongue base retraction;Pharyngeal  residue - valleculae  Penetration/Aspiration details (puree) (None)  Pharyngeal - Mechanical Soft (None)  Penetration/Aspiration details (mechanical soft) (None)  Pharyngeal - Regular (None)  Penetration/Aspiration details (regular) (None)  Pharyngeal - Multi-consistency Reduced tongue base  retraction;Pharyngeal residue - valleculae  Penetration/Aspiration details (multi-consistency) (None)  Pharyngeal - Pill Reduced pharyngeal peristalsis;Pharyngeal  residue - valleculae  Penetration/Aspiration details (pill) (None)  Pharyngeal Comment (None)     CHL IP CERVICAL ESOPHAGEAL PHASE 05/26/2014  Cervical Esophageal Phase WFL  Pudding Teaspoon (None)  Pudding Cup (None)  Honey Teaspoon (None)  Honey Cup (None)  Honey Syringe (None)  Nectar Teaspoon (None)  Nectar Cup (None)  Nectar Straw (None)  Nectar Syringe (None)  Thin Teaspoon (None)  Thin Cup (None)  Thin Straw (None)  Thin Syringe (None)  Cervical Esophageal Comment (None)    No flowsheet data found.  Celia B. Quentin Ore [facility], CCC-SLP [phone]       Shonna Chock 05/26/2014, 1:15 PM     Micro Results      Recent Results (from the past 240 hour(s))  Culture, blood (routine x 2)     Status: None (Preliminary result)   Collection Time: 05/24/14  9:20 PM  Result Value Ref Range Status   Specimen Description BLOOD LEFT WRIST  Final   Special Requests BOTTLES DRAWN AEROBIC AND ANAEROBIC 5CC EACH  Final   Culture   Final           BLOOD CULTURE RECEIVED NO GROWTH TO DATE CULTURE WILL BE HELD FOR 5 DAYS BEFORE ISSUING A FINAL NEGATIVE REPORT Performed at [facility]    Report Status PENDING  Incomplete  Culture, blood (routine x 2)     Status: None (Preliminary result)   Collection Time: 05/24/14  9:45  PM  Result Value Ref Range Status   Specimen Description BLOOD LEFT ARM  Final   Special Requests BOTTLES DRAWN AEROBIC AND ANAEROBIC 5CC  Final   Culture   Final           BLOOD CULTURE RECEIVED NO GROWTH TO DATE CULTURE WILL BE HELD FOR 5 DAYS BEFORE ISSUING A FINAL NEGATIVE REPORT Performed at [facility]    Report Status PENDING  Incomplete  Urine culture     Status: None   Collection Time: 05/24/14 10:20 PM  Result Value Ref Range Status   Specimen Description URINE, RANDOM  Final   Special Requests Normal  Final   Colony Count   Final    30,000 COLONIES/ML Performed at [facility]    Culture   Final    Multiple bacterial morphotypes present, none predominant. Suggest appropriate recollection if clinically indicated. Performed at [facility]    Report Status 05/26/2014 FINAL  Final  MRSA PCR Screening     Status: None   Collection Time: 05/25/14  2:05 AM  Result Value Ref Range Status   MRSA by PCR NEGATIVE NEGATIVE Final    Comment:        The GeneXpert MRSA Assay (FDA approved for NASAL specimens only), is one component of a comprehensive MRSA colonization surveillance program. It is not intended to diagnose MRSA infection nor to guide or monitor treatment for MRSA infections.   Culture, blood (routine x 2)     Status: None (Preliminary result)   Collection  Time: 05/26/14  3:10 PM  Result Value Ref Range Status   Specimen Description BLOOD LEFT HAND  Final   Special Requests BOTTLES DRAWN AEROBIC AND ANAEROBIC 10CC  Final   Culture   Final           BLOOD CULTURE RECEIVED NO GROWTH TO DATE CULTURE WILL BE HELD FOR 5 DAYS BEFORE ISSUING A FINAL NEGATIVE REPORT Performed at [facility]    Report Status PENDING  Incomplete  Culture, blood (routine x 2)     Status: None (Preliminary result)   Collection Time: 05/26/14  3:17 PM  Result Value Ref Range Status   Specimen Description BLOOD LEFT WRIST  Final   Special Requests  BOTTLES DRAWN AEROBIC AND ANAEROBIC 10CC  Final   Culture   Final           BLOOD CULTURE RECEIVED NO GROWTH TO DATE CULTURE WILL BE HELD FOR 5 DAYS BEFORE ISSUING A FINAL NEGATIVE REPORT Performed at [facility]    Report Status PENDING  Incomplete       Today   Subjective:   Thomas Ramos today has no headache,no chest abdominal pain,no new weakness tingling or numbness, feels much better wants to go home today.    Objective:   Blood pressure 103/66, pulse 73, temperature 99 F (37.2 C), temperature source Oral, resp. rate 24, height 4\' 10"  (1.473 m), weight 73.211 kg (161 lb 6.4 oz), SpO2 95 %.   Intake/Output Summary (Last 24 hours) at 05/28/14 1144 Last data filed at 05/28/14 0956  Gross per 24 hour  Intake   1319 ml  Output   2050 ml  Net   -731 ml    Exam Awake Alert, Oriented x 3, No new F.N deficits, Normal affect [place].AT,PERRAL Supple Neck,No JVD, No cervical lymphadenopathy appriciated.  Symmetrical Chest wall movement, Good air movement bilaterally, CTAB RRR,No Gallops,Rubs or new Murmurs, No Parasternal Heave +ve B.Sounds, Abd Soft, Non tender, No organomegaly appriciated, No rebound -guarding or rigidity. No Cyanosis, Clubbing or edema, No new Rash or bruise  Data Review   CBC w Diff:  Lab Results  Component Value Date   WBC 2.6* 05/28/2014   HGB 12.2* 05/28/2014   HCT 37.7* 05/28/2014   PLT 121* 05/28/2014   LYMPHOPCT 15 05/24/2014   MONOPCT 11 05/24/2014   EOSPCT 1 05/24/2014   BASOPCT 0 05/24/2014    CMP:  Lab Results  Component Value Date   NA 139 05/28/2014   K 3.7 05/28/2014   CL 104 05/28/2014   CO2 27 05/28/2014   BUN <5* 05/28/2014   CREATININE 0.54 05/28/2014   PROT 6.0 05/25/2014   ALBUMIN 3.5 05/25/2014   BILITOT 0.5 05/25/2014   ALKPHOS 83 05/25/2014   AST 20 05/25/2014   ALT 16 05/25/2014  .   Total Time in preparing paper work, data evaluation and todays exam - 35 minutes  Thurnell Lose M.D on 05/28/2014 at  11:44 AM  [facility]   Office  [phone]

## 2014-05-28 NOTE — Progress Notes (Signed)
Called [phone] to arrange patient transport per Jeb Levering, transport stated they are running behind and will be here 2-3 hours from now.

## 2014-05-28 NOTE — Progress Notes (Signed)
Conveyed to night shift RN patient is to be picked up by EMS transport, which is running late. All questions answered, gave night shift RN direct phone number of caregiver to call once EMS comes to pick up patient.

## 2014-05-29 ENCOUNTER — Telehealth: Payer: Self-pay | Admitting: Neurology

## 2014-05-29 NOTE — Telephone Encounter (Signed)
called and LM on patient voicemail that appt is cancelled due to we are no longer buy & bill for medication.

## 2014-05-31 LAB — CULTURE, BLOOD (ROUTINE X 2)
Culture: NO GROWTH
Culture: NO GROWTH

## 2014-06-01 LAB — CULTURE, BLOOD (ROUTINE X 2)
Culture: NO GROWTH
Culture: NO GROWTH

## 2014-06-06 ENCOUNTER — Telehealth: Payer: Self-pay | Admitting: Gastroenterology

## 2014-06-06 NOTE — Telephone Encounter (Signed)
Appointment scheduled. He is still choking when he eats. He reports he is taking very small bite, drinks in between bites and use a straw for drinking.

## 2014-06-13 ENCOUNTER — Ambulatory Visit (INDEPENDENT_AMBULATORY_CARE_PROVIDER_SITE_OTHER): Payer: Medicare Other | Admitting: Gastroenterology

## 2014-06-13 ENCOUNTER — Encounter: Payer: Self-pay | Admitting: Gastroenterology

## 2014-06-13 VITALS — BP 124/86 | HR 68

## 2014-06-13 DIAGNOSIS — R1310 Dysphagia, unspecified: Secondary | ICD-10-CM | POA: Diagnosis not present

## 2014-06-13 NOTE — Assessment & Plan Note (Signed)
Patient's dysphagia has recently worsened.  Plan repeat endoscopy with Botox injection

## 2014-06-13 NOTE — Patient Instructions (Signed)
You have been scheduled for a hospital procedure at [facility] instructions have been given We need someone from the facility you reside at to contact the office at [phone] and ask for Thomas Ramos to discuss this procedure that is scheduled, He will need his POA or Care Partner with him

## 2014-06-13 NOTE — Progress Notes (Signed)
      History of Present Illness:  [name] is here following hospitalization for pneumonia.  He has recurrent dysphagia.  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  Because of the pneumonia endoscopy was deferred while he was in the hospital.  He also underwent a swallowing evaluation.    Review of Systems: Pertinent positive and negative review of systems were noted in the above HPI section. All other review of systems were otherwise negative.    Current Medications, Allergies, Past Medical History, Past Surgical History, Family History and Social History were reviewed in [facility] record  Vital signs were reviewed in today's medical record. Physical Exam: General: Slightly chronically ill-appearing in no acute distress   See Assessment and Plan under Problem List

## 2014-06-27 ENCOUNTER — Ambulatory Visit: Payer: Medicare Other | Admitting: Neurology

## 2014-06-28 ENCOUNTER — Ambulatory Visit (HOSPITAL_COMMUNITY)
Admission: RE | Admit: 2014-06-28 | Discharge: 2014-06-28 | Disposition: A | Discharge status: Home / Self Care | Payer: Medicare Other | Source: Ambulatory Visit | Attending: Gastroenterology | Admitting: Gastroenterology

## 2014-06-28 ENCOUNTER — Encounter (HOSPITAL_COMMUNITY): Payer: Self-pay | Admitting: *Deleted

## 2014-06-28 ENCOUNTER — Encounter (HOSPITAL_COMMUNITY)
Admission: RE | Disposition: A | Discharge status: Home / Self Care | Payer: Self-pay | Source: Ambulatory Visit | Attending: Gastroenterology

## 2014-06-28 DIAGNOSIS — R1310 Dysphagia, unspecified: Secondary | ICD-10-CM | POA: Diagnosis present

## 2014-06-28 DIAGNOSIS — J189 Pneumonia, unspecified organism: Secondary | ICD-10-CM | POA: Insufficient documentation

## 2014-06-28 HISTORY — DX: Type 2 diabetes mellitus without complications: E11.9

## 2014-06-28 HISTORY — PX: BOTOX INJECTION: SHX5754

## 2014-06-28 HISTORY — PX: ESOPHAGOGASTRODUODENOSCOPY: SHX5428

## 2014-06-28 SURGERY — EGD (ESOPHAGOGASTRODUODENOSCOPY)
Anesthesia: Moderate Sedation

## 2014-06-28 MED ORDER — FENTANYL CITRATE 0.05 MG/ML IJ SOLN
INTRAMUSCULAR | Status: AC
  Filled 2014-06-28: qty 4

## 2014-06-28 MED ORDER — MIDAZOLAM HCL 5 MG/ML IJ SOLN
INTRAMUSCULAR | Status: AC
  Filled 2014-06-28: qty 3

## 2014-06-28 MED ORDER — SODIUM CHLORIDE 0.9 % IJ SOLN
INTRAMUSCULAR | Status: DC | PRN
  Administered 2014-06-28: 4 mL via SUBMUCOSAL

## 2014-06-28 MED ORDER — MIDAZOLAM HCL 5 MG/5ML IJ SOLN
INTRAMUSCULAR | Status: DC | PRN
  Administered 2014-06-28 (×2): 1 mg via INTRAVENOUS
  Administered 2014-06-28: 2 mg via INTRAVENOUS

## 2014-06-28 MED ORDER — FENTANYL CITRATE 0.05 MG/ML IJ SOLN
INTRAMUSCULAR | Status: DC | PRN
  Administered 2014-06-28 (×2): 12.5 ug via INTRAVENOUS
  Administered 2014-06-28: 25 ug via INTRAVENOUS

## 2014-06-28 MED ORDER — SODIUM CHLORIDE 0.9 % IJ SOLN
100.0000 [IU] | Freq: Once | INTRAMUSCULAR | Status: DC
  Filled 2014-06-28 (×2): qty 100

## 2014-06-28 MED ORDER — SODIUM CHLORIDE 0.9 % IV SOLN
INTRAVENOUS | Status: DC
  Administered 2014-06-28: 500 mL via INTRAVENOUS

## 2014-06-28 MED ORDER — BUTAMBEN-TETRACAINE-BENZOCAINE 2-2-14 % EX AERO
INHALATION_SPRAY | CUTANEOUS | Status: DC | PRN
  Administered 2014-06-28: 1 via TOPICAL

## 2014-06-28 MED ORDER — DIPHENHYDRAMINE HCL 50 MG/ML IJ SOLN
INTRAMUSCULAR | Status: AC
  Filled 2014-06-28: qty 1

## 2014-06-28 NOTE — H&P (View-Only) (Signed)
      History of Present Illness:  [name] is here following hospitalization for pneumonia.  He has recurrent dysphagia.  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  Because of the pneumonia endoscopy was deferred while he was in the hospital.  He also underwent a swallowing evaluation.    Review of Systems: Pertinent positive and negative review of systems were noted in the above HPI section. All other review of systems were otherwise negative.    Current Medications, Allergies, Past Medical History, Past Surgical History, Family History and Social History were reviewed in [facility] record  Vital signs were reviewed in today's medical record. Physical Exam: General: Slightly chronically ill-appearing in no acute distress   See Assessment and Plan under Problem List

## 2014-06-28 NOTE — Interval H&P Note (Signed)
History and Physical Interval Note:  06/28/2014 10:41 AM  Thomas Ramos  has presented today for surgery, with the diagnosis of Dysphagia   The various methods of treatment have been discussed with the patient and family. After consideration of risks, benefits and other options for treatment, the patient has consented to  Procedure(s): ESOPHAGOGASTRODUODENOSCOPY (EGD) (N/A) BOTOX INJECTION (N/A) as a surgical intervention .  The patient's history has been reviewed, patient examined, no change in status, stable for surgery.  I have reviewed the patient's chart and labs.  Questions were answered to the patient's satisfaction.    The recent H&P (dated **06/13/14*) was reviewed, the patient was examined and there is no change in the patients condition since that H&P was completed.   Erskine Emery  06/28/2014, 10:41 AM    Erskine Emery

## 2014-06-28 NOTE — Op Note (Signed)
[facility] [place], [id number]   ENDOSCOPY PROCEDURE REPORT  PATIENT: Thomas, Ramos  MR#: [id number] BIRTHDATE: 1971-01-10 , 44  yrs. old GENDER: male ENDOSCOPIST: Inda Castle, MD REFERRED BY:  Janalyn Rouse, M.D. PROCEDURE DATE:  06/28/2014 PROCEDURE:  EGD w/ directed submucosal injection(s), any substance  ASA CLASS:     Class II INDICATIONS:  dysphagia. 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  The    endoscope was introduced through the mouth and advanced to the second portion of the duodenum , Without limitations.  The instrument was slowly withdrawn as the mucosa was fully examined.        EXAM: The esophagus and gastroesophageal junction were completely normal in appearance.  The stomach was entered and closely examined.The antrum, angularis, and lesser curvature were well visualized, including a retroflexed view of the cardia and fundus. The stomach wall was normally distensable.  The scope passed easily through the pylorus into the duodenum.  Retroflexed views revealed no abnormalities.    1 cc (25 units) of Botox was injected submucosally into each quadrant at the GE junction. The scope was then withdrawn from the patient and the procedure completed.  COMPLICATIONS: There were no immediate complications.  ENDOSCOPIC IMPRESSION: normal endoscopy?"status post Botox injection  RECOMMENDATIONS: repeat injection as needed  REPEAT EXAM:  eSigned:  Inda Castle, MD 06/28/2014 11:24 AM    CC:

## 2014-06-28 NOTE — Discharge Instructions (Signed)
Esophagogastroduodenoscopy °Care After °Refer to this sheet in the next few weeks. These instructions provide you with information on caring for yourself after your procedure. Your caregiver may also give you more specific instructions. Your treatment has been planned according to current medical practices, but problems sometimes occur. Call your caregiver if you have any problems or questions after your procedure.  °HOME CARE INSTRUCTIONS °· Do not eat or drink anything until the numbing medicine (local anesthetic) has worn off and your gag reflex has returned. You will know that the local anesthetic has worn off when you can swallow comfortably. °· Do not drive for 12 hours after the procedure or as directed by your caregiver. °· Only take medicines as directed by your caregiver. °SEEK MEDICAL CARE IF:  °· You cannot stop coughing. °· You are not urinating at all or less than usual. °SEEK IMMEDIATE MEDICAL CARE IF: °· You have difficulty swallowing. °· You cannot eat or drink. °· You have worsening throat or chest pain. °· You have dizziness, lightheadedness, or you faint. °· You have nausea or vomiting. °· You have chills. °· You have a fever. °· You have severe abdominal pain. °· You have black, tarry, or bloody stools. °Document Released: 02/17/2012 Document Reviewed: 02/17/2012 °ExitCare® Patient Information ©2015 [employer]. This information is not intended to replace advice given to you by your health care provider. Make sure you discuss any questions you have with your health care provider. ° ° °Conscious Sedation, Adult, Care After °Refer to this sheet in the next few weeks. These instructions provide you with information on caring for yourself after your procedure. Your health care provider may also give you more specific instructions. Your treatment has been planned according to current medical practices, but problems sometimes occur. Call your health care provider if you have any problems or  questions after your procedure. °WHAT TO EXPECT AFTER THE PROCEDURE  °After your procedure: °· You may feel sleepy, clumsy, and have poor balance for several hours. °· Vomiting may occur if you eat too soon after the procedure. °HOME CARE INSTRUCTIONS °· Do not participate in any activities where you could become injured for at least 24 hours. Do not: °¨ Drive. °¨ Swim. °¨ Ride a bicycle. °¨ Operate heavy machinery. °¨ Cook. °¨ Use power tools. °¨ Climb ladders. °¨ Work from a high place. °· Do not make important decisions or sign legal documents until you are improved. °· If you vomit, drink water, juice, or soup when you can drink without vomiting. Make sure you have little or no nausea before eating solid foods. °· Only take over-the-counter or prescription medicines for pain, discomfort, or fever as directed by your health care provider. °· Make sure you and your family fully understand everything about the medicines given to you, including what side effects may occur. °· You should not drink alcohol, take sleeping pills, or take medicines that cause drowsiness for at least 24 hours. °· If you smoke, do not smoke without supervision. °· If you are feeling better, you may resume normal activities 24 hours after you were sedated. °· Keep all appointments with your health care provider. °SEEK MEDICAL CARE IF: °· Your skin is pale or bluish in color. °· You continue to feel nauseous or vomit. °· Your pain is getting worse and is not helped by medicine. °· You have bleeding or swelling. °· You are still sleepy or feeling clumsy after 24 hours. °SEEK IMMEDIATE MEDICAL CARE IF: °· You develop a   rash. °· You have difficulty breathing. °· You develop any type of allergic problem. °· You have a fever. °MAKE SURE YOU: °· Understand these instructions. °· Will watch your condition. °· Will get help right away if you are not doing well or get worse. °Document Released: 12/21/2012 Document Reviewed: 12/21/2012 °ExitCare®  Patient Information ©2015 [employer]. This information is not intended to replace advice given to you by your health care provider. Make sure you discuss any questions you have with your health care provider. ° °

## 2014-06-29 ENCOUNTER — Encounter (HOSPITAL_COMMUNITY): Payer: Self-pay | Admitting: Gastroenterology

## 2015-04-30 ENCOUNTER — Encounter (HOSPITAL_COMMUNITY): Payer: Self-pay

## 2015-04-30 ENCOUNTER — Emergency Department (HOSPITAL_COMMUNITY): Payer: Medicare Other

## 2015-04-30 ENCOUNTER — Emergency Department (HOSPITAL_COMMUNITY)
Admission: EM | Admit: 2015-04-30 | Discharge: 2015-04-30 | Disposition: A | Discharge status: Home / Self Care | Payer: Medicare Other | Attending: Emergency Medicine | Admitting: Emergency Medicine

## 2015-04-30 DIAGNOSIS — Z87891 Personal history of nicotine dependence: Secondary | ICD-10-CM | POA: Diagnosis not present

## 2015-04-30 DIAGNOSIS — Z79899 Other long term (current) drug therapy: Secondary | ICD-10-CM | POA: Insufficient documentation

## 2015-04-30 DIAGNOSIS — Z8701 Personal history of pneumonia (recurrent): Secondary | ICD-10-CM | POA: Diagnosis not present

## 2015-04-30 DIAGNOSIS — Z880 Allergy status to penicillin: Secondary | ICD-10-CM | POA: Insufficient documentation

## 2015-04-30 DIAGNOSIS — Z7951 Long term (current) use of inhaled steroids: Secondary | ICD-10-CM | POA: Diagnosis not present

## 2015-04-30 DIAGNOSIS — R000 Tachycardia, unspecified: Secondary | ICD-10-CM | POA: Insufficient documentation

## 2015-04-30 DIAGNOSIS — R05 Cough: Secondary | ICD-10-CM | POA: Insufficient documentation

## 2015-04-30 DIAGNOSIS — Z791 Long term (current) use of non-steroidal anti-inflammatories (NSAID): Secondary | ICD-10-CM | POA: Diagnosis not present

## 2015-04-30 DIAGNOSIS — K219 Gastro-esophageal reflux disease without esophagitis: Secondary | ICD-10-CM | POA: Diagnosis not present

## 2015-04-30 DIAGNOSIS — R0789 Other chest pain: Secondary | ICD-10-CM | POA: Diagnosis not present

## 2015-04-30 DIAGNOSIS — R079 Chest pain, unspecified: Secondary | ICD-10-CM | POA: Diagnosis present

## 2015-04-30 DIAGNOSIS — I10 Essential (primary) hypertension: Secondary | ICD-10-CM | POA: Diagnosis not present

## 2015-04-30 DIAGNOSIS — Z8669 Personal history of other diseases of the nervous system and sense organs: Secondary | ICD-10-CM | POA: Diagnosis not present

## 2015-04-30 LAB — I-STAT TROPONIN, ED
TROPONIN I, POC: 0 ng/mL (ref 0.00–0.08)
Troponin i, poc: 0 ng/mL (ref 0.00–0.08)

## 2015-04-30 LAB — BASIC METABOLIC PANEL
Anion gap: 12 (ref 5–15)
BUN: 7 mg/dL (ref 6–20)
CO2: 25 mmol/L (ref 22–32)
CREATININE: 0.6 mg/dL — AB (ref 0.61–1.24)
Calcium: 9.3 mg/dL (ref 8.9–10.3)
Chloride: 103 mmol/L (ref 101–111)
GFR calc Af Amer: >60 mL/min (ref >60)
Glucose, Bld: 96 mg/dL (ref 65–99)
Potassium: 4 mmol/L (ref 3.5–5.1)
SODIUM: 140 mmol/L (ref 135–145)

## 2015-04-30 LAB — I-STAT CHEM 8, ED
BUN: 8 mg/dL (ref 6–20)
CALCIUM ION: 1.11 mmol/L — AB (ref 1.12–1.23)
CHLORIDE: 100 mmol/L — AB (ref 101–111)
CREATININE: 0.7 mg/dL (ref 0.61–1.24)
Glucose, Bld: 115 mg/dL — ABNORMAL HIGH (ref 65–99)
HEMATOCRIT: 44 % (ref 39.0–52.0)
Hemoglobin: 15 g/dL (ref 13.0–17.0)
Potassium: 4 mmol/L (ref 3.5–5.1)
SODIUM: 142 mmol/L (ref 135–145)
TCO2: 25 mmol/L (ref 0–100)

## 2015-04-30 LAB — CBC
HCT: 43.2 % (ref 39.0–52.0)
Hemoglobin: 14.3 g/dL (ref 13.0–17.0)
MCH: 26.7 pg (ref 26.0–34.0)
MCHC: 33.1 g/dL (ref 30.0–36.0)
MCV: 80.6 fL (ref 78.0–100.0)
PLATELETS: 192 10*3/uL (ref 150–400)
RBC: 5.36 MIL/uL (ref 4.22–5.81)
RDW: 13 % (ref 11.5–15.5)
WBC: 6.6 10*3/uL (ref 4.0–10.5)

## 2015-04-30 LAB — D-DIMER, QUANTITATIVE (NOT AT ARMC)

## 2015-04-30 MED ORDER — SODIUM CHLORIDE 0.9 % IV BOLUS (SEPSIS)
500.0000 mL | Freq: Once | INTRAVENOUS | Status: AC
  Administered 2015-04-30: 500 mL via INTRAVENOUS

## 2015-04-30 MED ORDER — MORPHINE SULFATE (PF) 2 MG/ML IV SOLN
2.0000 mg | Freq: Once | INTRAVENOUS | Status: AC
  Administered 2015-04-30: 2 mg via INTRAVENOUS
  Filled 2015-04-30: qty 1

## 2015-04-30 MED ORDER — ACETAMINOPHEN 500 MG PO TABS
1000.0000 mg | ORAL_TABLET | Freq: Once | ORAL | Status: AC
  Administered 2015-04-30: 1000 mg via ORAL
  Filled 2015-04-30: qty 2

## 2015-04-30 NOTE — ED Provider Notes (Signed)
CSN: [id number]     Arrival date & time 04/30/15  1340 History   First MD Initiated Contact with Patient 04/30/15 1349     Chief Complaint  Patient presents with  . Chest Pain     (Consider location/radiation/quality/duration/timing/severity/associated sxs/prior Treatment) HPI Comments: 45 year old male with high blood pressure, cerebral palsy presents with chest tightness since this morning around 7:00. Patient denies any injuries. Pain worse with position. Pain fairly constant and nonexertional. No cardiac or blood clot history, no recent surgeries. Patient says he's fairly independent lives in apartment. Patient had aspirin on route.  Patient is a 45 y.o. male presenting with chest pain. The history is provided by the patient.  Chest Pain Associated symptoms: cough   Associated symptoms: no abdominal pain, no back pain, no fever, no headache, no nausea, no shortness of breath and not vomiting     Past Medical History  Diagnosis Date  . Motility disorder, esophageal   . Esophageal stricture   . Hypertension   . Pneumonia   . GERD (gastroesophageal reflux disease)   . CP (cerebral palsy) ([place])   . S/P Botox injection     approx every 4 months  . Seasonal allergies   . Environmental allergies     takes inhalers if needed  . 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 Cerebral palsy ([place])   . Diabetes mellitus without complication ([place])     borderline   Past Surgical History  Procedure Laterality Date  . Tendon release    . Mouth surgery    . Esophagus surgery      stretched esophagus  . Legs    . Esophagogastroduodenoscopy N/A 12/21/2012    Procedure: ESOPHAGOGASTRODUODENOSCOPY (EGD);  Surgeon: Inda Castle, MD;  Location: [facility];  Service: Endoscopy;  Laterality: N/A;  . Botox injection N/A 12/21/2012    Procedure: BOTOX INJECTION;  Surgeon: Inda Castle, MD;  Location: [facility];  Service: Endoscopy;  Laterality: N/A;  . Esophagogastroduodenoscopy N/A  06/28/2014    Procedure: ESOPHAGOGASTRODUODENOSCOPY (EGD);  Surgeon: Inda Castle, MD;  Location: [facility];  Service: Endoscopy;  Laterality: N/A;  . Botox injection N/A 06/28/2014    Procedure: BOTOX INJECTION;  Surgeon: Inda Castle, MD;  Location: [facility];  Service: Endoscopy;  Laterality: N/A;   Family History  Problem Relation Age of Onset  . Hypertension Father   . Lung cancer Father   . Diabetes Mother    Social History  Substance Use Topics  . Smoking status: Former Research scientist (life sciences)  . Smokeless tobacco: Former Systems developer     Comment: smoked one time  . Alcohol Use: 0.0 oz/week     Comment: rare    Review of Systems  Constitutional: Negative for fever and chills.  HENT: Negative for congestion.   Eyes: Negative for visual disturbance.  Respiratory: Positive for cough. Negative for shortness of breath.   Cardiovascular: Positive for chest pain.  Gastrointestinal: Negative for nausea, vomiting and abdominal pain.  Genitourinary: Negative for dysuria and flank pain.  Musculoskeletal: Negative for back pain, neck pain and neck stiffness.  Skin: Negative for rash.  Neurological: Negative for light-headedness and headaches.      Allergies  Metronidazole and Penicillins  Home Medications   Prior to Admission medications   Medication Sig Start Date End Date Taking? Authorizing Provider  amLODipine (NORVASC) 5 MG tablet Take 5 mg by mouth every evening.   Yes Historical Provider, MD  cetirizine (ZYRTEC) 10 MG tablet Take 10 mg by  mouth daily.  03/01/14  Yes Historical Provider, MD  citalopram (CELEXA) 20 MG tablet Take 20 mg by mouth every evening.   Yes Historical Provider, MD  CRESTOR 5 MG tablet Take 5 mg by mouth at bedtime. 04/22/14  Yes Historical Provider, MD  fluticasone (FLONASE) 50 MCG/ACT nasal spray Place 1 spray into both nostrils daily.  03/01/14  Yes Historical Provider, MD  meloxicam (MOBIC) 7.5 MG tablet Take 7.5 mg by mouth daily.   Yes Historical Provider,  MD  montelukast (SINGULAIR) 10 MG tablet Take 10 mg by mouth daily.   Yes Historical Provider, MD  omeprazole (PRILOSEC) 20 MG capsule Take 20 mg by mouth daily.   Yes Historical Provider, MD  PROAIR HFA 108 (90 BASE) MCG/ACT inhaler Inhale 2 puffs into the lungs every 6 (six) hours as needed for wheezing or shortness of breath.  12/29/12  Yes Historical Provider, MD  ranitidine (ZANTAC) 150 MG capsule Take 150 mg by mouth 2 (two) times daily as needed for heartburn.   Yes Historical Provider, MD  levofloxacin (LEVAQUIN) 500 MG tablet Take 1 tablet (500 mg total) by mouth daily. Patient not taking: Reported on 04/30/2015 05/28/14   Thurnell Lose, MD  omeprazole (PRILOSEC) 40 MG capsule Take 1 capsule (40 mg total) by mouth 2 (two) times daily before a meal. Patient not taking: Reported on 04/30/2015 05/28/14   Thurnell Lose, MD  oseltamivir (TAMIFLU) 75 MG capsule Take 1 capsule (75 mg total) by mouth 2 (two) times daily. Patient not taking: Reported on 04/30/2015 05/28/14   Thurnell Lose, MD  oxyCODONE-acetaminophen (PERCOCET/ROXICET) 5-325 MG per tablet Take 1-2 pills every 4-6 hours as needed for pain. Patient not taking: Reported on 04/30/2015 07/19/13   Noland Fordyce, PA-C   BP 132/103 mmHg  Pulse 97  Temp(Src) 98.5 F (36.9 C) (Oral)  Resp 18  SpO2 100% Physical Exam  Constitutional: He is oriented to person, place, and time. He appears well-developed and well-nourished.  HENT:  Head: Normocephalic and atraumatic.  Eyes: Conjunctivae are normal. Right eye exhibits no discharge. Left eye exhibits no discharge.  Neck: Normal range of motion. Neck supple. No tracheal deviation present.  Cardiovascular: Regular rhythm.  Tachycardia present.   Pulmonary/Chest: Effort normal and breath sounds normal.  Abdominal: Soft. He exhibits no distension. There is no tenderness. There is no guarding.  Musculoskeletal: He exhibits no edema.  Neurological: He is alert and oriented to person, place,  and time.  Skin: Skin is warm. No rash noted.  Psychiatric: He has a normal mood and affect.  Nursing note and vitals reviewed.   ED Course  Procedures (including critical care time) Labs Review Labs Reviewed  BASIC METABOLIC PANEL - Abnormal; Notable for the following:    Creatinine, Ser 0.60 (*)    All other components within normal limits  I-STAT CHEM 8, ED - Abnormal; Notable for the following:    Chloride 100 (*)    Glucose, Bld 115 (*)    Calcium, Ion 1.11 (*)    All other components within normal limits  CBC  D-DIMER, QUANTITATIVE (NOT AT [facility])  I-STAT TROPOININ, ED  I-STAT TROPOININ, ED    Imaging Review No results found. I have personally reviewed and evaluated these images and lab results as part of my medical decision-making.   EKG Interpretation   Date/Time:  Tuesday April 30 2015 13:45:58 EST Ventricular Rate:  114 PR Interval:  146 QRS Duration: 92 QT Interval:  319 QTC Calculation: 439  R Axis:   116 Text Interpretation:  Sinus tachycardia Right axis deviation Confirmed by  Cora Stetson  MD, Takeria Marquina ([id number]) on 04/30/2015 2:27:11 PM      MDM   Final diagnoses:  Chest tightness   Patient presents with chest pain since this morning sinus tachycardia without acute ST elevation or depression on EKG. Patient very low risk cardiac and low risk blood clot. Plan for d-dimer, pain meds and screening cardiac enzymes.    Patient symptoms improved in the ER. Patient's d-dimer negative. Plan for delta troponin and likely close outpatient follow.  Signed out to reassess and follow second troponin.     Elnora Morrison, MD 05/04/15 [phone]

## 2015-04-30 NOTE — Discharge Instructions (Signed)
If you were given medicines take as directed.  If you are on coumadin or contraceptives realize their levels and effectiveness is altered by many different medicines.  If you have any reaction (rash, tongues swelling, other) to the medicines stop taking and see a physician.    If your blood pressure was elevated in the ER make sure you follow up for management with a primary doctor or return for chest pain, shortness of breath or stroke symptoms.  Please follow up as directed and return to the ER or see a physician for new or worsening symptoms.  Thank you. Filed Vitals:   04/30/15 1350  BP: 134/93  Pulse: 110  Temp: 98.5 F (36.9 C)  TempSrc: Oral  Resp: 20  SpO2: 99%

## 2015-04-30 NOTE — ED Notes (Signed)
Pt. BIB EMS for evaluation of central CP starting this AM around 0700. Pt. Went to work and pain was still present. No recent trauma/fall. Pt. States pain worsens with certain positions. Pt. Denies radiation. HR 128. Pt. AxO x4.

## 2015-04-30 NOTE — ED Notes (Signed)
Contacted lab to add-on d-dimer and BMP.

## 2015-04-30 NOTE — ED Notes (Signed)
Pt. Given 324 ASA and 1 Nitro in route.

## 2015-05-26 ENCOUNTER — Emergency Department (HOSPITAL_COMMUNITY)
Admission: EM | Admit: 2015-05-26 | Discharge: 2015-05-27 | Disposition: A | Discharge status: Home / Self Care | Payer: Medicare Other | Attending: Emergency Medicine | Admitting: Emergency Medicine

## 2015-05-26 ENCOUNTER — Encounter (HOSPITAL_COMMUNITY): Payer: Self-pay | Admitting: *Deleted

## 2015-05-26 ENCOUNTER — Emergency Department (HOSPITAL_COMMUNITY): Payer: Medicare Other

## 2015-05-26 DIAGNOSIS — Z87891 Personal history of nicotine dependence: Secondary | ICD-10-CM | POA: Insufficient documentation

## 2015-05-26 DIAGNOSIS — Z79899 Other long term (current) drug therapy: Secondary | ICD-10-CM | POA: Diagnosis not present

## 2015-05-26 DIAGNOSIS — Z7951 Long term (current) use of inhaled steroids: Secondary | ICD-10-CM | POA: Diagnosis not present

## 2015-05-26 DIAGNOSIS — Z880 Allergy status to penicillin: Secondary | ICD-10-CM | POA: Diagnosis not present

## 2015-05-26 DIAGNOSIS — E119 Type 2 diabetes mellitus without complications: Secondary | ICD-10-CM | POA: Insufficient documentation

## 2015-05-26 DIAGNOSIS — Z8701 Personal history of pneumonia (recurrent): Secondary | ICD-10-CM | POA: Insufficient documentation

## 2015-05-26 DIAGNOSIS — K219 Gastro-esophageal reflux disease without esophagitis: Secondary | ICD-10-CM | POA: Diagnosis not present

## 2015-05-26 DIAGNOSIS — R0600 Dyspnea, unspecified: Secondary | ICD-10-CM

## 2015-05-26 DIAGNOSIS — I10 Essential (primary) hypertension: Secondary | ICD-10-CM | POA: Diagnosis not present

## 2015-05-26 DIAGNOSIS — R000 Tachycardia, unspecified: Secondary | ICD-10-CM | POA: Diagnosis not present

## 2015-05-26 DIAGNOSIS — R0602 Shortness of breath: Secondary | ICD-10-CM | POA: Diagnosis present

## 2015-05-26 DIAGNOSIS — Z8669 Personal history of other diseases of the nervous system and sense organs: Secondary | ICD-10-CM | POA: Insufficient documentation

## 2015-05-26 NOTE — ED Notes (Signed)
Bed: WA03 Expected date:  Expected time:  Means of arrival:  Comments: EMS shortness of breath/ anxiety CP

## 2015-05-26 NOTE — ED Provider Notes (Signed)
CSN: [id number]     Arrival date & time 05/26/15  2237 History  By signing my name below, I, Doran Stabler, attest that this documentation has been prepared under the direction and in the presence of Virgel Manifold, MD. Electronically Signed: Doran Stabler, ED Scribe. 05/26/2015. 11:24 PM.   Chief Complaint  Patient presents with  . Shortness of Breath  . Anxiety   The history is provided by the patient. No language interpreter was used.   HPI Comments: ZAYON DOMMER is a 45 y.o. male with a PMHx of chronic leg swelling, HTN, cerebral palsy and DM who presents to the Emergency Department via EMS complaining of SOB, earlier tonight. Pt reports when he went to lie down to go to bed, he could not catch his breath. He states before that he was in no distress and felt fine. Pt denies hx of similar. Pt denies any CP, back pain, fever, cough, nausea, abdominal pain, or any other symptoms at this time.   Past Medical History  Diagnosis Date  . Motility disorder, esophageal   . Esophageal stricture   . Hypertension   . Pneumonia   . GERD (gastroesophageal reflux disease)   . CP (cerebral palsy) ([place])   . S/P Botox injection     approx every 4 months  . Seasonal allergies   . Environmental allergies     takes inhalers if needed  . 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 Cerebral palsy ([place])   . Diabetes mellitus without complication ([place])     borderline   Past Surgical History  Procedure Laterality Date  . Tendon release    . Mouth surgery    . Esophagus surgery      stretched esophagus  . Legs    . Esophagogastroduodenoscopy N/A 12/21/2012    Procedure: ESOPHAGOGASTRODUODENOSCOPY (EGD);  Surgeon: Inda Castle, MD;  Location: [facility];  Service: Endoscopy;  Laterality: N/A;  . Botox injection N/A 12/21/2012    Procedure: BOTOX INJECTION;  Surgeon: Inda Castle, MD;  Location: [facility];  Service: Endoscopy;  Laterality: N/A;  . Esophagogastroduodenoscopy N/A 06/28/2014     Procedure: ESOPHAGOGASTRODUODENOSCOPY (EGD);  Surgeon: Inda Castle, MD;  Location: [place];  Service: Endoscopy;  Laterality: N/A;  . Botox injection N/A 06/28/2014    Procedure: BOTOX INJECTION;  Surgeon: Inda Castle, MD;  Location: [place];  Service: Endoscopy;  Laterality: N/A;   Family History  Problem Relation Age of Onset  . Hypertension Father   . Lung cancer Father   . Diabetes Mother    Social History  Substance Use Topics  . Smoking status: Former Research scientist (life sciences)  . Smokeless tobacco: Former Systems developer     Comment: smoked one time  . Alcohol Use: 0.0 oz/week     Comment: rare    Review of Systems  Constitutional: Negative for fever.  Respiratory: Positive for shortness of breath. Negative for cough.   Cardiovascular: Negative for chest pain.  Gastrointestinal: Negative for nausea and abdominal pain.  Musculoskeletal: Negative for back pain.  All other systems reviewed and are negative.  Allergies  Metronidazole and Penicillins  Home Medications   Prior to Admission medications   Medication Sig Start Date End Date Taking? Authorizing Provider  amLODipine (NORVASC) 5 MG tablet Take 5 mg by mouth every evening.   Yes Historical Provider, MD  cetirizine (ZYRTEC) 10 MG tablet Take 10 mg by mouth daily.  03/01/14  Yes Historical Provider, MD  citalopram (CELEXA) 20 MG tablet  Take 20 mg by mouth every evening.   Yes Historical Provider, MD  CRESTOR 5 MG tablet Take 5 mg by mouth at bedtime. 04/22/14  Yes Historical Provider, MD  fluticasone (FLONASE) 50 MCG/ACT nasal spray Place 1 spray into both nostrils daily.  03/01/14  Yes Historical Provider, MD  montelukast (SINGULAIR) 10 MG tablet Take 10 mg by mouth daily.   Yes Historical Provider, MD  omeprazole (PRILOSEC) 20 MG capsule Take 20 mg by mouth daily.   Yes Historical Provider, MD  PROAIR HFA 108 (90 BASE) MCG/ACT inhaler Inhale 2 puffs into the lungs every 6 (six) hours as needed for wheezing or shortness of breath.   12/29/12  Yes Historical Provider, MD  ranitidine (ZANTAC) 150 MG capsule Take 150 mg by mouth 2 (two) times daily as needed for heartburn.   Yes Historical Provider, MD   BP 148/88 mmHg  Pulse 122  Temp(Src) 98.4 F (36.9 C) (Oral)  Resp 20  SpO2 96%   Physical Exam  Constitutional: He appears well-developed and well-nourished. No distress.  Contractures consistent with cerebral palsy.   HENT:  Head: Normocephalic and atraumatic.  Right Ear: External ear normal.  Left Ear: External ear normal.  Eyes: Conjunctivae are normal. Right eye exhibits no discharge. Left eye exhibits no discharge. No scleral icterus.  Neck: Neck supple. No tracheal deviation present.  Cardiovascular: Regular rhythm and intact distal pulses.  Tachycardia present.   Pulmonary/Chest: Effort normal and breath sounds normal. No stridor. No respiratory distress. He has no wheezes. He has no rales.  Abdominal: Soft. Bowel sounds are normal. He exhibits no distension. There is no tenderness. There is no rebound and no guarding.  Musculoskeletal: He exhibits no edema or tenderness.  Neurological: He is alert. He has normal strength. No sensory deficit. Cranial nerve deficit: no facial droop, extraocular movements intact, no slurred speech. He exhibits normal muscle tone. He displays no seizure activity. Coordination normal.  Skin: Skin is warm and dry. No rash noted.  Psychiatric: He has a normal mood and affect.  Nursing note and vitals reviewed.   ED Course  Procedures   DIAGNOSTIC STUDIES: Oxygen Saturation is 96% on room air, normal by my interpretation.    COORDINATION OF CARE: 11:19 PM Will order CXR and EKG. Discussed treatment plan with pt at bedside and pt agreed to plan.  Labs Review Labs Reviewed  BASIC METABOLIC PANEL - Abnormal; Notable for the following:    Glucose, Bld 174 (*)    All other components within normal limits  TROPONIN I  D-DIMER, QUANTITATIVE (NOT AT [facility])  CBC WITH  DIFFERENTIAL/PLATELET    Imaging Review No results found. I have personally reviewed and evaluated these images and lab results as part of my medical decision-making.   EKG Interpretation   Date/Time:  Sunday May 26 2015 22:48:45 EDT Ventricular Rate:  113 PR Interval:  64 QRS Duration: 94 QT Interval:  420 QTC Calculation: 576 R Axis:   109 Text Interpretation:  Sinus tachycardia Right axis deviation Borderline T  abnormalities, anterior leads Prolonged QT interval Confirmed by Johnney Killian,  MD, Jeannie Done [phone]) on 05/26/2015 10:57:05 PM      MDM   Final diagnoses:  Dyspnea    45 year old male with dyspnea of unclear etiology. Workup reassuring. Doubt ACS. Chest x-ray is without acute abnormality. He has no increased work of breathing. D-dimer is normal. Possible anxiety?. It has been determined that no acute conditions requiring further emergency intervention are present at this time. The  patient has been advised of the diagnosis and plan. I reviewed any labs and imaging including any potential incidental findings. We have discussed signs and symptoms that warrant return to the ED and they are listed in the discharge instructions.    I personally preformed the services scribed in my presence. The recorded information has been reviewed is accurate. Virgel Manifold, MD.   Virgel Manifold, MD 05/31/15 [phone]

## 2015-05-26 NOTE — ED Notes (Addendum)
Per EMS, pt with hx of cerebral palsy complains of SOB and anxiety since waking up at ~945 this evening. Pt denies chest pain. Pt states "Every bone ion my body feels tight"

## 2015-05-27 DIAGNOSIS — R0600 Dyspnea, unspecified: Secondary | ICD-10-CM | POA: Diagnosis not present

## 2015-05-27 LAB — CBC WITH DIFFERENTIAL/PLATELET
Basophils Absolute: 0 10*3/uL (ref 0.0–0.1)
Basophils Relative: 1 %
Eosinophils Absolute: 0.1 10*3/uL (ref 0.0–0.7)
Eosinophils Relative: 2 %
HCT: 40.5 % (ref 39.0–52.0)
Hemoglobin: 13 g/dL (ref 13.0–17.0)
Lymphocytes Relative: 30 %
Lymphs Abs: 1.9 10*3/uL (ref 0.7–4.0)
MCH: 27 pg (ref 26.0–34.0)
MCHC: 32.1 g/dL (ref 30.0–36.0)
MCV: 84.2 fL (ref 78.0–100.0)
Monocytes Absolute: 0.5 10*3/uL (ref 0.1–1.0)
Monocytes Relative: 8 %
Neutro Abs: 3.8 10*3/uL (ref 1.7–7.7)
Neutrophils Relative %: 59 %
Platelets: 219 10*3/uL (ref 150–400)
RBC: 4.81 MIL/uL (ref 4.22–5.81)
RDW: 13.2 % (ref 11.5–15.5)
WBC: 6.3 10*3/uL (ref 4.0–10.5)

## 2015-05-27 LAB — BASIC METABOLIC PANEL
Anion gap: 9 (ref 5–15)
BUN: 16 mg/dL (ref 6–20)
CO2: 25 mmol/L (ref 22–32)
Calcium: 9.4 mg/dL (ref 8.9–10.3)
Chloride: 106 mmol/L (ref 101–111)
Creatinine, Ser: 1.01 mg/dL (ref 0.61–1.24)
GFR calc Af Amer: >60 mL/min (ref >60)
GFR calc non Af Amer: >60 mL/min (ref >60)
Glucose, Bld: 174 mg/dL — ABNORMAL HIGH (ref 65–99)
Potassium: 4 mmol/L (ref 3.5–5.1)
Sodium: 140 mmol/L (ref 135–145)

## 2015-05-27 LAB — TROPONIN I: Troponin I: <0.03 ng/mL (ref <0.031)

## 2015-05-27 LAB — D-DIMER, QUANTITATIVE (NOT AT ARMC): D-Dimer, Quant: 0.34 ug/mL-FEU (ref 0.00–0.50)

## 2015-05-27 NOTE — Discharge Instructions (Signed)

## 2015-05-27 NOTE — ED Notes (Signed)
PTAR came to get pt and pt tried to call his neighbor to let him into his appartment.  Neighbor did not pick up phone and pt got in touch with his caregiver and she will come let him in between 7-8 am this morning.  Pt reports living at home alone.

## 2015-06-03 ENCOUNTER — Emergency Department (HOSPITAL_COMMUNITY)
Admission: EM | Admit: 2015-06-03 | Discharge: 2015-06-04 | Disposition: A | Discharge status: Home / Self Care | Payer: Medicare Other | Attending: Emergency Medicine | Admitting: Emergency Medicine

## 2015-06-03 ENCOUNTER — Emergency Department (HOSPITAL_COMMUNITY): Payer: Medicare Other

## 2015-06-03 ENCOUNTER — Encounter (HOSPITAL_COMMUNITY): Payer: Self-pay | Admitting: Emergency Medicine

## 2015-06-03 ENCOUNTER — Emergency Department (HOSPITAL_COMMUNITY)
Admission: EM | Admit: 2015-06-03 | Discharge: 2015-06-03 | Disposition: A | Discharge status: Home / Self Care | Payer: Medicare Other | Attending: Emergency Medicine | Admitting: Emergency Medicine

## 2015-06-03 DIAGNOSIS — Z8701 Personal history of pneumonia (recurrent): Secondary | ICD-10-CM | POA: Diagnosis not present

## 2015-06-03 DIAGNOSIS — Z87891 Personal history of nicotine dependence: Secondary | ICD-10-CM | POA: Insufficient documentation

## 2015-06-03 DIAGNOSIS — Z7951 Long term (current) use of inhaled steroids: Secondary | ICD-10-CM | POA: Insufficient documentation

## 2015-06-03 DIAGNOSIS — Z880 Allergy status to penicillin: Secondary | ICD-10-CM | POA: Insufficient documentation

## 2015-06-03 DIAGNOSIS — K219 Gastro-esophageal reflux disease without esophagitis: Secondary | ICD-10-CM | POA: Diagnosis not present

## 2015-06-03 DIAGNOSIS — R197 Diarrhea, unspecified: Secondary | ICD-10-CM | POA: Insufficient documentation

## 2015-06-03 DIAGNOSIS — R1110 Vomiting, unspecified: Secondary | ICD-10-CM | POA: Insufficient documentation

## 2015-06-03 DIAGNOSIS — I10 Essential (primary) hypertension: Secondary | ICD-10-CM | POA: Diagnosis not present

## 2015-06-03 DIAGNOSIS — R600 Localized edema: Secondary | ICD-10-CM | POA: Diagnosis not present

## 2015-06-03 DIAGNOSIS — F419 Anxiety disorder, unspecified: Secondary | ICD-10-CM | POA: Insufficient documentation

## 2015-06-03 DIAGNOSIS — R0602 Shortness of breath: Secondary | ICD-10-CM | POA: Diagnosis present

## 2015-06-03 DIAGNOSIS — Z79899 Other long term (current) drug therapy: Secondary | ICD-10-CM | POA: Diagnosis not present

## 2015-06-03 DIAGNOSIS — R0600 Dyspnea, unspecified: Secondary | ICD-10-CM

## 2015-06-03 DIAGNOSIS — J441 Chronic obstructive pulmonary disease with (acute) exacerbation: Secondary | ICD-10-CM | POA: Diagnosis not present

## 2015-06-03 LAB — CBC WITH DIFFERENTIAL/PLATELET
Basophils Absolute: 0 10*3/uL (ref 0.0–0.1)
Basophils Relative: 0 %
EOS ABS: 0.2 10*3/uL (ref 0.0–0.7)
EOS PCT: 3 %
HCT: 39.3 % (ref 39.0–52.0)
Hemoglobin: 13 g/dL (ref 13.0–17.0)
LYMPHS ABS: 2.6 10*3/uL (ref 0.7–4.0)
LYMPHS PCT: 40 %
MCH: 27.1 pg (ref 26.0–34.0)
MCHC: 33.1 g/dL (ref 30.0–36.0)
MCV: 82 fL (ref 78.0–100.0)
MONO ABS: 0.6 10*3/uL (ref 0.1–1.0)
MONOS PCT: 9 %
Neutro Abs: 3.1 10*3/uL (ref 1.7–7.7)
Neutrophils Relative %: 48 %
PLATELETS: 193 10*3/uL (ref 150–400)
RBC: 4.79 MIL/uL (ref 4.22–5.81)
RDW: 12.9 % (ref 11.5–15.5)
WBC: 6.4 10*3/uL (ref 4.0–10.5)

## 2015-06-03 LAB — COMPREHENSIVE METABOLIC PANEL
ALT: 20 U/L (ref 17–63)
ANION GAP: 12 (ref 5–15)
AST: 22 U/L (ref 15–41)
Albumin: 3.7 g/dL (ref 3.5–5.0)
Alkaline Phosphatase: 105 U/L (ref 38–126)
BUN: 9 mg/dL (ref 6–20)
CHLORIDE: 103 mmol/L (ref 101–111)
CO2: 25 mmol/L (ref 22–32)
CREATININE: 0.66 mg/dL (ref 0.61–1.24)
Calcium: 9.2 mg/dL (ref 8.9–10.3)
GFR calc non Af Amer: >60 mL/min (ref >60)
GLUCOSE: 118 mg/dL — AB (ref 65–99)
POTASSIUM: 3.8 mmol/L (ref 3.5–5.1)
SODIUM: 140 mmol/L (ref 135–145)
Total Bilirubin: 0.2 mg/dL — ABNORMAL LOW (ref 0.3–1.2)
Total Protein: 6.6 g/dL (ref 6.5–8.1)

## 2015-06-03 MED ORDER — ALBUTEROL SULFATE (2.5 MG/3ML) 0.083% IN NEBU: 5.0000 mg | INHALATION_SOLUTION | Freq: Once | RESPIRATORY_TRACT | Status: DC

## 2015-06-03 MED ORDER — ALBUTEROL SULFATE (2.5 MG/3ML) 0.083% IN NEBU
5.0000 mg | INHALATION_SOLUTION | Freq: Once | RESPIRATORY_TRACT | Status: AC
  Administered 2015-06-03: 5 mg via RESPIRATORY_TRACT
  Filled 2015-06-03: qty 6

## 2015-06-03 NOTE — ED Notes (Addendum)
Wrong pt roomed to A11. Wrong triage info for current pt. Pt information verified by this RN and discrepancy found. Wrong pt registered. Current pt verified by pt name and spelling, social security, and address.

## 2015-06-03 NOTE — ED Notes (Signed)
Pt coming from home with c/o SOB. Pt stats he can't breath. 98% on RA. VSS with EMS. Pt recently seen with [place] with SOB complaints, diagnosed SOB.

## 2015-06-03 NOTE — ED Provider Notes (Signed)
CSN: [id number]     Arrival date & time 06/03/15  2250 History   None    Chief Complaint  Patient presents with  . Shortness of Breath     The history is provided by the patient and the EMS personnel. No language interpreter was used.   Thomas Ramos is a 45 y.o. male who presents to the Emergency Department complaining of SOB.  He has a history of cerebral palsy and was seen a week ago in the emergency department for shortness of breath. He reports ongoing shortness of breath. He did have vomiting, diarrhea and chest pain last week, this is now resolved. He has chronic intermittent lower extremity edema, unchanged from prior. He denies any fevers, abdominal pain. Moderate and waxing and waning in nature. He has an aide that assists him at home but otherwise he lives alone. He is requesting a breathing treatment. His shortness of breath is worse with sitting up and laying on his side and improved when he is supine.  Past Medical History  Diagnosis Date  . Motility disorder, esophageal   . Esophageal stricture   . Hypertension   . Pneumonia   . GERD (gastroesophageal reflux disease)   . CP (cerebral palsy) ([place])   . S/P Botox injection     approx every 4 months  . Seasonal allergies   . Environmental allergies     takes inhalers if needed  . 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 Cerebral palsy ([place])   . Diabetes mellitus without complication ([place])     borderline   Past Surgical History  Procedure Laterality Date  . Tendon release    . Mouth surgery    . Esophagus surgery      stretched esophagus  . Legs    . Esophagogastroduodenoscopy N/A 12/21/2012    Procedure: ESOPHAGOGASTRODUODENOSCOPY (EGD);  Surgeon: Inda Castle, MD;  Location: [facility];  Service: Endoscopy;  Laterality: N/A;  . Botox injection N/A 12/21/2012    Procedure: BOTOX INJECTION;  Surgeon: Inda Castle, MD;  [facility];  Service: Endoscopy;  Laterality: N/A;  . Esophagogastroduodenoscopy  N/A 06/28/2014    Procedure: ESOPHAGOGASTRODUODENOSCOPY (EGD);  Surgeon: Inda Castle, MD;  Location: [place];  Service: Endoscopy;  Laterality: N/A;  . Botox injection N/A 06/28/2014    Procedure: BOTOX INJECTION;  Surgeon: Inda Castle, MD;  Location: [place];  Service: Endoscopy;  Laterality: N/A;   Family History  Problem Relation Age of Onset  . Hypertension Father   . Lung cancer Father   . Diabetes Mother    Social History  Substance Use Topics  . Smoking status: Former Research scientist (life sciences)  . Smokeless tobacco: Former Systems developer     Comment: smoked one time  . Alcohol Use: 0.0 oz/week     Comment: rare    Review of Systems  All other systems reviewed and are negative.     Allergies  Metronidazole and Penicillins  Home Medications   Prior to Admission medications   Medication Sig Start Date End Date Taking? Authorizing Provider  amLODipine (NORVASC) 5 MG tablet Take 5 mg by mouth every evening.    Historical Provider, MD  cetirizine (ZYRTEC) 10 MG tablet Take 10 mg by mouth daily.  03/01/14   Historical Provider, MD  citalopram (CELEXA) 20 MG tablet Take 20 mg by mouth every evening.    Historical Provider, MD  CRESTOR 5 MG tablet Take 5 mg by mouth at bedtime. 04/22/14   Historical  Provider, MD  fluticasone (FLONASE) 50 MCG/ACT nasal spray Place 1 spray into both nostrils daily.  03/01/14   Historical Provider, MD  montelukast (SINGULAIR) 10 MG tablet Take 10 mg by mouth daily.    Historical Provider, MD  omeprazole (PRILOSEC) 20 MG capsule Take 20 mg by mouth daily.    Historical Provider, MD  PROAIR HFA 108 (90 BASE) MCG/ACT inhaler Inhale 2 puffs into the lungs every 6 (six) hours as needed for wheezing or shortness of breath.  12/29/12   Historical Provider, MD  ranitidine (ZANTAC) 150 MG capsule Take 150 mg by mouth 2 (two) times daily as needed for heartburn.    Historical Provider, MD   There were no vitals taken for this visit. Physical Exam  Constitutional: He is  oriented to person, place, and time. He appears well-developed and well-nourished.  HENT:  Head: Normocephalic and atraumatic.  Cardiovascular: Normal rate and regular rhythm.   No murmur heard. Pulmonary/Chest: Effort normal and breath sounds normal. No respiratory distress.  Abdominal: There is no rebound and no guarding.  Mildly distended nontender abdomen  Musculoskeletal: He exhibits no tenderness.  Trace nonpitting edema of BLE; contractures of all four extremities.     Neurological: He is alert and oriented to person, place, and time.  Skin: Skin is warm and dry.  Psychiatric:  anxious  Nursing note and vitals reviewed.   ED Course  Procedures (including critical care time) Labs Review Labs Reviewed  COMPREHENSIVE METABOLIC PANEL  CBC WITH DIFFERENTIAL/PLATELET  I-STAT TROPOININ, ED    Imaging Review No results found. I have personally reviewed and evaluated these images and lab results as part of my medical decision-making.   EKG Interpretation   Date/Time:  Monday June 03 2015 23:05:53 EDT Ventricular Rate:  76 PR Interval:  148 QRS Duration: 103 QT Interval:  402 QTC Calculation: 452 R Axis:   92 Text Interpretation:  Sinus rhythm Borderline right axis deviation  Borderline T abnormalities, anterior leads Confirmed by Christy Gentles  MD,  DONALD ([id number]) on 06/03/2015 11:08:38 PM      MDM   Final diagnoses:  None   Pt with hx/o COPD here with sob.  He is anxious appearing on examination with no respiratory distress.  Providing nebulizer treatment to eval for symptomatic improvement.  Reviewed records from prior ED visit on 3/13.  Pt care transferred to Dr. Christy Gentles pending repeat assessment.     Quintella Reichert, MD 06/03/15 [phone]

## 2015-06-04 NOTE — ED Provider Notes (Signed)
Pt awake/alert He is well appearing No distress Lung sounds clear CXR negative No tachycardia No hypoxia (pulse ox >95% when I am in room) He feels comfortable for d/c home He has home aide present daily and will be there tomorrow at 7am He has PCP followup  Ripley Fraise, MD 06/04/15 [phone]

## 2015-06-04 NOTE — ED Notes (Signed)
PTAR CALLED @ 0113.

## 2015-06-04 NOTE — ED Notes (Signed)
Pt not in room to recheck vitals

## 2015-06-05 LAB — I-STAT TROPONIN, ED: TROPONIN I, POC: 0 ng/mL (ref 0.00–0.08)

## 2015-06-30 ENCOUNTER — Encounter (HOSPITAL_COMMUNITY): Payer: Self-pay | Admitting: Emergency Medicine

## 2015-06-30 ENCOUNTER — Emergency Department (HOSPITAL_COMMUNITY): Payer: Medicare Other

## 2015-06-30 ENCOUNTER — Emergency Department (HOSPITAL_COMMUNITY)
Admission: EM | Admit: 2015-06-30 | Discharge: 2015-07-01 | Disposition: A | Discharge status: Home / Self Care | Payer: Medicare Other | Attending: Emergency Medicine | Admitting: Emergency Medicine

## 2015-06-30 DIAGNOSIS — Z880 Allergy status to penicillin: Secondary | ICD-10-CM | POA: Diagnosis not present

## 2015-06-30 DIAGNOSIS — R0602 Shortness of breath: Secondary | ICD-10-CM | POA: Diagnosis not present

## 2015-06-30 DIAGNOSIS — R61 Generalized hyperhidrosis: Secondary | ICD-10-CM | POA: Insufficient documentation

## 2015-06-30 DIAGNOSIS — R509 Fever, unspecified: Secondary | ICD-10-CM | POA: Insufficient documentation

## 2015-06-30 DIAGNOSIS — Z8669 Personal history of other diseases of the nervous system and sense organs: Secondary | ICD-10-CM | POA: Insufficient documentation

## 2015-06-30 DIAGNOSIS — R079 Chest pain, unspecified: Secondary | ICD-10-CM | POA: Insufficient documentation

## 2015-06-30 DIAGNOSIS — I10 Essential (primary) hypertension: Secondary | ICD-10-CM | POA: Diagnosis not present

## 2015-06-30 DIAGNOSIS — Z8701 Personal history of pneumonia (recurrent): Secondary | ICD-10-CM | POA: Insufficient documentation

## 2015-06-30 DIAGNOSIS — R000 Tachycardia, unspecified: Secondary | ICD-10-CM | POA: Diagnosis not present

## 2015-06-30 DIAGNOSIS — Z87891 Personal history of nicotine dependence: Secondary | ICD-10-CM | POA: Diagnosis not present

## 2015-06-30 DIAGNOSIS — Z7951 Long term (current) use of inhaled steroids: Secondary | ICD-10-CM | POA: Diagnosis not present

## 2015-06-30 DIAGNOSIS — F419 Anxiety disorder, unspecified: Secondary | ICD-10-CM | POA: Diagnosis not present

## 2015-06-30 DIAGNOSIS — R140 Abdominal distension (gaseous): Secondary | ICD-10-CM | POA: Diagnosis not present

## 2015-06-30 DIAGNOSIS — K219 Gastro-esophageal reflux disease without esophagitis: Secondary | ICD-10-CM | POA: Diagnosis not present

## 2015-06-30 DIAGNOSIS — R110 Nausea: Secondary | ICD-10-CM | POA: Insufficient documentation

## 2015-06-30 DIAGNOSIS — Z79899 Other long term (current) drug therapy: Secondary | ICD-10-CM | POA: Insufficient documentation

## 2015-06-30 DIAGNOSIS — E119 Type 2 diabetes mellitus without complications: Secondary | ICD-10-CM | POA: Insufficient documentation

## 2015-06-30 DIAGNOSIS — R4781 Slurred speech: Secondary | ICD-10-CM | POA: Insufficient documentation

## 2015-06-30 LAB — CBC WITH DIFFERENTIAL/PLATELET
BASOS ABS: 0 10*3/uL (ref 0.0–0.1)
Basophils Relative: 0 %
Eosinophils Absolute: 0.1 10*3/uL (ref 0.0–0.7)
Eosinophils Relative: 1 %
HEMATOCRIT: 41.5 % (ref 39.0–52.0)
Hemoglobin: 13.3 g/dL (ref 13.0–17.0)
LYMPHS PCT: 29 %
Lymphs Abs: 2.2 10*3/uL (ref 0.7–4.0)
MCH: 26.1 pg (ref 26.0–34.0)
MCHC: 32 g/dL (ref 30.0–36.0)
MCV: 81.5 fL (ref 78.0–100.0)
MONO ABS: 0.8 10*3/uL (ref 0.1–1.0)
MONOS PCT: 11 %
NEUTROS ABS: 4.5 10*3/uL (ref 1.7–7.7)
Neutrophils Relative %: 59 %
Platelets: 206 10*3/uL (ref 150–400)
RBC: 5.09 MIL/uL (ref 4.22–5.81)
RDW: 13 % (ref 11.5–15.5)
WBC: 7.6 10*3/uL (ref 4.0–10.5)

## 2015-06-30 LAB — I-STAT TROPONIN, ED: Troponin i, poc: 0 ng/mL (ref 0.00–0.08)

## 2015-06-30 NOTE — ED Provider Notes (Signed)
CSN: [id number]     Arrival date & time 06/30/15  2247 History  By signing my name below, I, Thomas Ramos, attest that this documentation has been prepared under the direction and in the presence of [name], MD. Electronically Signed: Irene Ramos, ED Scribe. 06/30/2015. 11:58 PM.   Chief Complaint  Patient presents with  . Shortness of Breath   The history is provided by the patient. No language interpreter was used.  HPI Comments: Thomas Ramos is a 45 y.o. Male with a hx of esophageal stricture, HTN, pneumonia, cerebral palsy, DM, and quadriplegic spinal paralysis brought in by EMS who presents to the Emergency Department complaining of SOB onset PTA. He reports associated mild chest pain and nausea. Pt initially complained of fever and chills starting today. He states this feels similar to previous presentations for SOB. Per EMS, pt was wheezing upon EMS arrival and given 1 albuterol treatment. Pt was able to call 911 by himself but unable to reach his inhaler at home. He said he was given a treatment by EMS that made him nauseated. Pt states that his blood pressure was high this morning. He reports hx of similar symptoms. He denies sick contacts, cough, vomiting, abdominal pain, new diarrhea, rash, or dysuria.  Past Medical History  Diagnosis Date  . Motility disorder, esophageal   . Esophageal stricture   . Hypertension   . Pneumonia   . GERD (gastroesophageal reflux disease)   . CP (cerebral palsy) ([place])   . S/P Botox injection     approx every 4 months  . Seasonal allergies   . Environmental allergies     takes inhalers if needed  . 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 Cerebral palsy ([place])   . Diabetes mellitus without complication ([place])     borderline   Past Surgical History  Procedure Laterality Date  . Tendon release    . Mouth surgery    . Esophagus surgery      stretched esophagus  . Legs    . Esophagogastroduodenoscopy N/A 12/21/2012    Procedure:  ESOPHAGOGASTRODUODENOSCOPY (EGD);  Surgeon: Inda Castle, MD;  Location: [facility];  Service: Endoscopy;  Laterality: N/A;  . Botox injection N/A 12/21/2012    Procedure: BOTOX INJECTION;  Surgeon: Inda Castle, MD;  [facility];  Service: Endoscopy;  Laterality: N/A;  . Esophagogastroduodenoscopy N/A 06/28/2014    Procedure: ESOPHAGOGASTRODUODENOSCOPY (EGD);  Surgeon: Inda Castle, MD;  Location: [place];  Service: Endoscopy;  Laterality: N/A;  . Botox injection N/A 06/28/2014    Procedure: BOTOX INJECTION;  Surgeon: Inda Castle, MD;  Location: [place];  Service: Endoscopy;  Laterality: N/A;   Family History  Problem Relation Age of Onset  . Hypertension Father   . Lung cancer Father   . Diabetes Mother    Social History  Substance Use Topics  . Smoking status: Former Research scientist (life sciences)  . Smokeless tobacco: Former Systems developer     Comment: smoked one time  . Alcohol Use: 0.0 oz/week     Comment: rare    Review of Systems 10 Systems reviewed and all are negative for acute change except as noted in the HPI.  Allergies  Metronidazole and Penicillins  Home Medications   Prior to Admission medications   Medication Sig Start Date End Date Taking? Authorizing Provider  amLODipine (NORVASC) 5 MG tablet Take 5 mg by mouth every evening.   Yes Historical Provider, MD  cetirizine (ZYRTEC) 10 MG tablet Take  10 mg by mouth daily.  03/01/14  Yes Historical Provider, MD  citalopram (CELEXA) 20 MG tablet Take 20 mg by mouth every evening.   Yes Historical Provider, MD  CRESTOR 5 MG tablet Take 5 mg by mouth at bedtime. 04/22/14  Yes Historical Provider, MD  fluticasone (FLONASE) 50 MCG/ACT nasal spray Place 1 spray into both nostrils daily.  03/01/14  Yes Historical Provider, MD  montelukast (SINGULAIR) 10 MG tablet Take 10 mg by mouth daily.   Yes Historical Provider, MD  omeprazole (PRILOSEC) 20 MG capsule Take 20 mg by mouth daily.   Yes Historical Provider, MD  PROAIR HFA  108 (90 BASE) MCG/ACT inhaler Inhale 2 puffs into the lungs every 6 (six) hours as needed for wheezing or shortness of breath.  12/29/12  Yes Historical Provider, MD  ranitidine (ZANTAC) 150 MG capsule Take 150 mg by mouth 2 (two) times daily as needed for heartburn.   Yes Historical Provider, MD   BP 90/53 mmHg  Pulse 74  Temp(Src) 98.2 F (36.8 C) (Oral)  Resp 19  Wt 150 lb (68.04 kg)  SpO2 100% Physical Exam  Constitutional: He is oriented to person, place, and time. He appears well-developed and well-nourished. No distress.  HENT:  Head: Normocephalic and atraumatic.  Moist mucous membranes  Eyes: Conjunctivae are normal. Pupils are equal, round, and reactive to light.  Neck: Neck supple.  Cardiovascular: Regular rhythm and normal heart sounds.  Tachycardia present.   No murmur heard. Pulmonary/Chest: Effort normal.  Coarse and diminished breath sounds bilaterally  Abdominal: Bowel sounds are normal. He exhibits distension. There is no tenderness.  Musculoskeletal: He exhibits no edema.  Atrophied arms and legs  Neurological: He is alert and oriented to person, place, and time.  Delayed and slurred speech but normal comprehension  Skin: Skin is warm. No rash noted. He is diaphoretic.  Psychiatric: He has a normal mood and affect. Judgment normal.  Nursing note and vitals reviewed.   ED Course  Procedures (including critical care time) DIAGNOSTIC STUDIES: Oxygen Saturation is 95% on RA, normal by my interpretation.    COORDINATION OF CARE: 11:51 PM-Discussed treatment plan which includes chest x-ray, EKG, and labs with pt at bedside and pt agreed to plan.    Labs Review Labs Reviewed  BASIC METABOLIC PANEL - Abnormal; Notable for the following:    Glucose, Bld 151 (*)    All other components within normal limits  URINALYSIS, ROUTINE W REFLEX MICROSCOPIC (NOT AT [facility]) - Abnormal; Notable for the following:    Specific Gravity, Urine 1.031 (*)    Bilirubin Urine SMALL  (*)    All other components within normal limits  BRAIN NATRIURETIC PEPTIDE  CBC WITH DIFFERENTIAL/PLATELET  I-STAT TROPOININ, ED  I-STAT CG4 LACTIC ACID, ED    Imaging Review Dg Chest 2 View  07/01/2015  CLINICAL DATA:  Shortness of breath tonight. EXAM: CHEST  2 VIEW COMPARISON:  06/03/2015 FINDINGS: Shallow inspiration. Probable atelectasis in the lung bases. Heart size is prominent but likely normal for technique. No blunting of costophrenic angles. No pneumothorax. Mediastinal contours appear grossly intact. Degenerative changes in the spine. Old appearing vertebral compression deformities. IMPRESSION: Shallow inspiration with atelectasis in the lung bases. Electronically Signed   By: Lucienne Capers M.D.   On: 07/01/2015 00:34   I have personally reviewed and evaluated these images and lab results as part of my medical decision-making.   EKG Interpretation   Date/Time:  Sunday June 30 2015 22:58:28 EDT Ventricular  Rate:  116 PR Interval:  155 QRS Duration: 90 QT Interval:  324 QTC Calculation: 450 R Axis:   114 Text Interpretation:  Sinus tachycardia Probable left atrial enlargement  Right axis deviation Borderline T wave abnormalities tachycardia new from  previous, otherwise no significant changes Confirmed by Janyth Riera MD, Torsten Weniger  [id number]) on 06/30/2015 11:02:44 PM      MDM   Final diagnoses:  Shortness of breath  Patient presents with shortness of breath that began at home; he states he was unable to reach his inhaler. He received albuterol by EMS. On exam, he was anxious but awake and alert, mildly diaphoretic but in no acute distress. Vital signs showed normal rectal temperature, mild tachycardia at 118 likely due to albuterol. He had normal work of breathing on exam, mildly diminished breath sounds but no wheezing or crackles. Chest x-ray shows atelectasis without acute infiltrate. EKG shows tachycardia but otherwise unchanged from previous EKG. Obtained above lab work  which shows normal CBC, BNP, troponin, and BMP. I reviewed his chart which shows several recent visits for shortness of breath; patient has had negative d-dimer study twice therefore I feel PE is unlikely. On several reexaminations here, he has been resting comfortably without dyspnea or change in O2 sat. He denies any significant smoking history and given no wheezing, I do not feel he needs steroids or antibiotics at this time. I discussed supportive care and emphasized need to follow-up with PCP this week. Patient voiced understanding and was discharged in satisfactory condition.   I personally performed the services described in this documentation, which was scribed in my presence. The recorded information has been reviewed and is accurate.   [name], MD 07/01/15 [phone]

## 2015-06-30 NOTE — ED Notes (Signed)
Per ems, hx of cerebral palsy. Wheezing 1 albuterol prior to arrival. Complains of fever and chills. Complains now of shortness of breath. Sinus tach in 120s on monitor, 98% on 2L, was initially 84% on room air. Patient from home alone. He was able to call 911 himself, unable to reach his inhaler.

## 2015-07-01 DIAGNOSIS — R0602 Shortness of breath: Secondary | ICD-10-CM | POA: Diagnosis not present

## 2015-07-01 LAB — URINALYSIS, ROUTINE W REFLEX MICROSCOPIC
Glucose, UA: NEGATIVE mg/dL
HGB URINE DIPSTICK: NEGATIVE
Ketones, ur: NEGATIVE mg/dL
LEUKOCYTES UA: NEGATIVE
NITRITE: NEGATIVE
PROTEIN: NEGATIVE mg/dL
SPECIFIC GRAVITY, URINE: 1.031 — AB (ref 1.005–1.030)
pH: 5.5 (ref 5.0–8.0)

## 2015-07-01 LAB — BASIC METABOLIC PANEL
ANION GAP: 10 (ref 5–15)
BUN: 13 mg/dL (ref 6–20)
CHLORIDE: 105 mmol/L (ref 101–111)
CO2: 25 mmol/L (ref 22–32)
Calcium: 9.3 mg/dL (ref 8.9–10.3)
Creatinine, Ser: 0.85 mg/dL (ref 0.61–1.24)
GFR calc non Af Amer: >60 mL/min (ref >60)
Glucose, Bld: 151 mg/dL — ABNORMAL HIGH (ref 65–99)
Potassium: 3.9 mmol/L (ref 3.5–5.1)
SODIUM: 140 mmol/L (ref 135–145)

## 2015-07-01 LAB — BRAIN NATRIURETIC PEPTIDE: B NATRIURETIC PEPTIDE 5: 12.3 pg/mL (ref 0.0–100.0)

## 2015-07-01 MED ORDER — ONDANSETRON HCL 4 MG/2ML IJ SOLN
4.0000 mg | Freq: Once | INTRAMUSCULAR | Status: AC
  Administered 2015-07-01: 4 mg via INTRAVENOUS
  Filled 2015-07-01: qty 2

## 2015-07-01 MED ORDER — SODIUM CHLORIDE 0.9 % IV BOLUS (SEPSIS)
1000.0000 mL | Freq: Once | INTRAVENOUS | Status: AC
  Administered 2015-07-01: 1000 mL via INTRAVENOUS

## 2015-07-01 NOTE — ED Notes (Signed)
PTAR CALLED @ [phone].

## 2015-07-01 NOTE — Discharge Instructions (Signed)

## 2015-07-07 ENCOUNTER — Emergency Department (HOSPITAL_COMMUNITY): Payer: Medicare Other

## 2015-07-07 ENCOUNTER — Emergency Department (HOSPITAL_COMMUNITY)
Admission: EM | Admit: 2015-07-07 | Discharge: 2015-07-08 | Disposition: A | Discharge status: Home / Self Care | Payer: Medicare Other | Attending: Emergency Medicine | Admitting: Emergency Medicine

## 2015-07-07 ENCOUNTER — Encounter (HOSPITAL_COMMUNITY): Payer: Self-pay

## 2015-07-07 DIAGNOSIS — K219 Gastro-esophageal reflux disease without esophagitis: Secondary | ICD-10-CM | POA: Diagnosis not present

## 2015-07-07 DIAGNOSIS — E119 Type 2 diabetes mellitus without complications: Secondary | ICD-10-CM | POA: Diagnosis not present

## 2015-07-07 DIAGNOSIS — Z87891 Personal history of nicotine dependence: Secondary | ICD-10-CM | POA: Insufficient documentation

## 2015-07-07 DIAGNOSIS — R0602 Shortness of breath: Secondary | ICD-10-CM | POA: Diagnosis present

## 2015-07-07 DIAGNOSIS — Z880 Allergy status to penicillin: Secondary | ICD-10-CM | POA: Insufficient documentation

## 2015-07-07 DIAGNOSIS — G809 Cerebral palsy, unspecified: Secondary | ICD-10-CM | POA: Diagnosis not present

## 2015-07-07 DIAGNOSIS — Z8701 Personal history of pneumonia (recurrent): Secondary | ICD-10-CM | POA: Insufficient documentation

## 2015-07-07 DIAGNOSIS — Z7951 Long term (current) use of inhaled steroids: Secondary | ICD-10-CM | POA: Diagnosis not present

## 2015-07-07 DIAGNOSIS — I10 Essential (primary) hypertension: Secondary | ICD-10-CM | POA: Insufficient documentation

## 2015-07-07 DIAGNOSIS — Z79899 Other long term (current) drug therapy: Secondary | ICD-10-CM | POA: Insufficient documentation

## 2015-07-07 DIAGNOSIS — R0600 Dyspnea, unspecified: Secondary | ICD-10-CM | POA: Diagnosis not present

## 2015-07-07 MED ORDER — IPRATROPIUM-ALBUTEROL 0.5-2.5 (3) MG/3ML IN SOLN
3.0000 mL | Freq: Once | RESPIRATORY_TRACT | Status: AC
  Administered 2015-07-08: 3 mL via RESPIRATORY_TRACT
  Filled 2015-07-07: qty 3

## 2015-07-07 NOTE — ED Notes (Signed)
Pt from home by [facility]. Pt complaining of SOB. Pt lung sounds clear 96% on room air. EMS stated that pt was anxious and they talked him down with breathing and pt states "I feel better now"

## 2015-07-07 NOTE — ED Notes (Signed)
Patient placed on 2L of  oxygen states he feel like he can not breathe . Patient stats before oxygen placed 98%

## 2015-07-07 NOTE — ED Provider Notes (Signed)
CSN: [id number]     Arrival date & time 07/07/15  2151 History  By signing my name below, I, Thomas Ramos, attest that this documentation has been prepared under the direction and in the presence of Delora Fuel, MD . Electronically Signed: Evelene Ramos, Scribe. 07/07/2015. 11:39 PM.    Chief Complaint  Patient presents with  . Shortness of Breath     The history is provided by the patient. No language interpreter was used.     HPI Comments:  Thomas Ramos is a 45 y.o. male with a history of cerebral palsy, and PNA, who presents to the Emergency Department via EMS complaining of difficulty breathing that began while he was trying to go to bed this evening. He notes associated mild cough earlier in the day. Pt also notes similar episode of SOB  4 days ago that improved. He denies chills, fever, and diaphoresis. No alleviating factors noted.   Past Medical History  Diagnosis Date  . Motility disorder, esophageal   . Esophageal stricture   . Hypertension   . Pneumonia   . GERD (gastroesophageal reflux disease)   . CP (cerebral palsy) ([place])   . S/P Botox injection     approx every 4 months  . Seasonal allergies   . Environmental allergies     takes inhalers if needed  . 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 Cerebral palsy ([place])   . Diabetes mellitus without complication ([place])     borderline   Past Surgical History  Procedure Laterality Date  . Tendon release    . Mouth surgery    . Esophagus surgery      stretched esophagus  . Legs    . Esophagogastroduodenoscopy N/A 12/21/2012    Procedure: ESOPHAGOGASTRODUODENOSCOPY (EGD);  Surgeon: Inda Castle, MD;  Location: [facility];  Service: Endoscopy;  Laterality: N/A;  . Botox injection N/A 12/21/2012    Procedure: BOTOX INJECTION;  Surgeon: Inda Castle, MD;  Location: [facility];  Service: Endoscopy;  Laterality: N/A;  . Esophagogastroduodenoscopy N/A 06/28/2014    Procedure: ESOPHAGOGASTRODUODENOSCOPY (EGD);   Surgeon: Inda Castle, MD;  Location: [facility];  Service: Endoscopy;  Laterality: N/A;  . Botox injection N/A 06/28/2014    Procedure: BOTOX INJECTION;  Surgeon: Inda Castle, MD;  Location: [facility];  Service: Endoscopy;  Laterality: N/A;   Family History  Problem Relation Age of Onset  . Hypertension Father   . Lung cancer Father   . Diabetes Mother    Social History  Substance Use Topics  . Smoking status: Former Research scientist (life sciences)  . Smokeless tobacco: Former Systems developer     Comment: smoked one time  . Alcohol Use: 0.0 oz/week     Comment: rare    Review of Systems  Constitutional: Negative for fever, chills and diaphoresis.  Respiratory: Positive for cough and shortness of breath.   All other systems reviewed and are negative.   Allergies  Metronidazole and Penicillins  Home Medications   Prior to Admission medications   Medication Sig Start Date End Date Taking? Authorizing Provider  amLODipine (NORVASC) 5 MG tablet Take 5 mg by mouth every evening.   Yes Historical Provider, MD  cetirizine (ZYRTEC) 10 MG tablet Take 10 mg by mouth daily.  03/01/14  Yes Historical Provider, MD  citalopram (CELEXA) 20 MG tablet Take 20 mg by mouth every evening.   Yes Historical Provider, MD  CRESTOR 5 MG tablet Take 5 mg by mouth at bedtime. 04/22/14  Yes  Historical Provider, MD  fluticasone (FLONASE) 50 MCG/ACT nasal spray Place 1 spray into both nostrils daily.  03/01/14  Yes Historical Provider, MD  montelukast (SINGULAIR) 10 MG tablet Take 10 mg by mouth daily.   Yes Historical Provider, MD  omeprazole (PRILOSEC) 20 MG capsule Take 20 mg by mouth daily.   Yes Historical Provider, MD  PROAIR HFA 108 (90 BASE) MCG/ACT inhaler Inhale 2 puffs into the lungs every 6 (six) hours as needed for wheezing or shortness of breath.  12/29/12  Yes Historical Provider, MD  ranitidine (ZANTAC) 150 MG capsule Take 150 mg by mouth 2 (two) times daily as needed for heartburn.   Yes Historical Provider, MD    BP 143/84 mmHg  Pulse 90  Temp(Src) 97.8 F (36.6 C) (Oral)  Resp 20  SpO2 96% Physical Exam  Constitutional: He is oriented to person, place, and time. He appears well-developed and well-nourished. No distress.  HENT:  Head: Normocephalic and atraumatic.  Eyes: Conjunctivae and EOM are normal. Pupils are equal, round, and reactive to light.  Neck: No JVD present.  Cardiovascular: Normal rate, regular rhythm and normal heart sounds.   No murmur heard. Pulmonary/Chest: Effort normal and breath sounds normal. He has no wheezes. He has no rales. He exhibits no tenderness.  Abdominal: Soft. Bowel sounds are normal. He exhibits no distension and no mass. There is no tenderness.  Musculoskeletal: He exhibits no edema.  Contracture in arms and legs   Lymphadenopathy:    He has no cervical adenopathy.  Neurological: He is alert and oriented to person, place, and time. No cranial nerve deficit.  Quadriplegic  Speech moderately dysarthric  Prominent nystagmus on upward gaze  Skin: Skin is warm and dry. No rash noted.  Psychiatric: He has a normal mood and affect.  Nursing note and vitals reviewed.   ED Course  Procedures   DIAGNOSTIC STUDIES:  Oxygen Saturation is 96% on RA, normal by my interpretation.    COORDINATION OF CARE:  11:32 PM Will order breathing tx. Discussed treatment plan with pt at bedside and pt agreed to plan.  Labs Review Results for orders placed or performed during the hospital encounter of 07/07/15  CBC with Differential  Result Value Ref Range   WBC 7.2 4.0 - 10.5 K/uL   RBC 4.96 4.22 - 5.81 MIL/uL   Hemoglobin 13.4 13.0 - 17.0 g/dL   HCT 40.7 39.0 - 52.0 %   MCV 82.1 78.0 - 100.0 fL   MCH 27.0 26.0 - 34.0 pg   MCHC 32.9 30.0 - 36.0 g/dL   RDW 13.3 11.5 - 15.5 %   Platelets 193 150 - 400 K/uL   Neutrophils Relative % 54 %   Neutro Abs 3.8 1.7 - 7.7 K/uL   Lymphocytes Relative 35 %   Lymphs Abs 2.5 0.7 - 4.0 K/uL   Monocytes Relative 8 %    Monocytes Absolute 0.6 0.1 - 1.0 K/uL   Eosinophils Relative 2 %   Eosinophils Absolute 0.2 0.0 - 0.7 K/uL   Basophils Relative 1 %   Basophils Absolute 0.1 0.0 - 0.1 K/uL  Brain natriuretic peptide  Result Value Ref Range   B Natriuretic Peptide 9.2 0.0 - 100.0 pg/mL  Basic metabolic panel  Result Value Ref Range   Sodium 141 135 - 145 mmol/L   Potassium 3.9 3.5 - 5.1 mmol/L   Chloride 106 101 - 111 mmol/L   CO2 24 22 - 32 mmol/L   Glucose, Bld 131 (H) 65 -  99 mg/dL   BUN 10 6 - 20 mg/dL   Creatinine, Ser 0.62 0.61 - 1.24 mg/dL   Calcium 9.3 8.9 - 10.3 mg/dL   GFR calc non Af Amer >60 >60 mL/min   GFR calc Af Amer >60 >60 mL/min   Anion gap 11 5 - 15   Imaging Review Dg Chest Portable 1 View  07/07/2015  CLINICAL DATA:  Acute onset of shortness of breath. Initial encounter. EXAM: PORTABLE CHEST 1 VIEW COMPARISON:  Chest radiograph performed 06/30/2015 FINDINGS: The lungs are hypoexpanded. There is elevation of the right hemidiaphragm. Patchy bilateral airspace opacities may reflect mild interstitial edema or atelectasis. No pleural effusion or pneumothorax is seen, though the left costophrenic angle is incompletely imaged on this study. The cardiomediastinal silhouette is mildly enlarged. No acute osseous abnormalities are identified. IMPRESSION: Lungs hypoexpanded. Elevation of the right hemidiaphragm. Mild cardiomegaly. Patchy bilateral airspace opacities may reflect mild interstitial edema or atelectasis. Electronically Signed   By: Garald Balding M.D.   On: 07/07/2015 23:15   I have personally reviewed and evaluated these images and lab results as part of my medical decision-making.   EKG Interpretation   Date/Time:  Sunday July 07 2015 22:16:08 EDT Ventricular Rate:  90 PR Interval:  150 QRS Duration: 95 QT Interval:  359 QTC Calculation: 439 R Axis:   102 Text Interpretation:  Sinus rhythm Right axis deviation Borderline T wave  abnormalities When compared with ECG of  06/30/2015, No significant change  was found Confirmed by [facility]  MD, Siddh Vandeventer ([id number]) on 07/07/2015 11:05:52 PM      MDM   Final diagnoses:  Dyspnea  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HCC)    Dyspnea with normal oxygen saturations on exam and normal lung exam when I see him. Old records are reviewed and he does have prior ED visits and hospital admissions for dyspnea and pneumonia. Chest x-ray shows no evidence of pneumonia. He was given an albuterol with ipratropium nebulizer treatment with subjective improvement. Decision was made to give him a single dose of dexamethasone. He has albuterol that he can use at home. He is to follow-up with his PCP.  I personally performed the services described in this documentation, which was scribed in my presence. The recorded information has been reviewed and is accurate.       Delora Fuel, MD [phone] [phone]

## 2015-07-08 DIAGNOSIS — R0600 Dyspnea, unspecified: Secondary | ICD-10-CM | POA: Diagnosis not present

## 2015-07-08 LAB — CBC WITH DIFFERENTIAL/PLATELET
BASOS PCT: 1 %
Basophils Absolute: 0.1 10*3/uL (ref 0.0–0.1)
EOS ABS: 0.2 10*3/uL (ref 0.0–0.7)
Eosinophils Relative: 2 %
HCT: 40.7 % (ref 39.0–52.0)
Hemoglobin: 13.4 g/dL (ref 13.0–17.0)
Lymphocytes Relative: 35 %
Lymphs Abs: 2.5 10*3/uL (ref 0.7–4.0)
MCH: 27 pg (ref 26.0–34.0)
MCHC: 32.9 g/dL (ref 30.0–36.0)
MCV: 82.1 fL (ref 78.0–100.0)
MONO ABS: 0.6 10*3/uL (ref 0.1–1.0)
MONOS PCT: 8 %
Neutro Abs: 3.8 10*3/uL (ref 1.7–7.7)
Neutrophils Relative %: 54 %
Platelets: 193 10*3/uL (ref 150–400)
RBC: 4.96 MIL/uL (ref 4.22–5.81)
RDW: 13.3 % (ref 11.5–15.5)
WBC: 7.2 10*3/uL (ref 4.0–10.5)

## 2015-07-08 LAB — BASIC METABOLIC PANEL
Anion gap: 11 (ref 5–15)
BUN: 10 mg/dL (ref 6–20)
CHLORIDE: 106 mmol/L (ref 101–111)
CO2: 24 mmol/L (ref 22–32)
CREATININE: 0.62 mg/dL (ref 0.61–1.24)
Calcium: 9.3 mg/dL (ref 8.9–10.3)
GFR calc Af Amer: >60 mL/min (ref >60)
GLUCOSE: 131 mg/dL — AB (ref 65–99)
Potassium: 3.9 mmol/L (ref 3.5–5.1)
SODIUM: 141 mmol/L (ref 135–145)

## 2015-07-08 LAB — BRAIN NATRIURETIC PEPTIDE: B NATRIURETIC PEPTIDE 5: 9.2 pg/mL (ref 0.0–100.0)

## 2015-07-08 MED ORDER — DEXAMETHASONE SODIUM PHOSPHATE 10 MG/ML IJ SOLN
10.0000 mg | Freq: Once | INTRAMUSCULAR | Status: AC
  Administered 2015-07-08: 10 mg via INTRAVENOUS
  Filled 2015-07-08: qty 1

## 2015-07-08 NOTE — Discharge Instructions (Signed)
Continue using your inhaler as needed.   Shortness of Breath Shortness of breath means you have trouble breathing. It could also mean that you have a medical problem. You should get immediate medical care for shortness of breath. CAUSES   Not enough oxygen in the air such as with high altitudes or a smoke-filled room.  Certain lung diseases, infections, or problems.  Heart disease or conditions, such as angina or heart failure.  Low red blood cells (anemia).  Poor physical fitness, which can cause shortness of breath when you exercise.  Chest or back injuries or stiffness.  Being overweight.  Smoking.  Anxiety, which can make you feel like you are not getting enough air. DIAGNOSIS  Serious medical problems can often be found during your physical exam. Tests may also be done to determine why you are having shortness of breath. Tests may include:  Chest X-rays.  Lung function tests.  Blood tests.  An electrocardiogram (ECG).  An ambulatory electrocardiogram. An ambulatory ECG records your heartbeat patterns over a 24-hour period.  Exercise testing.  A transthoracic echocardiogram (TTE). During echocardiography, sound waves are used to evaluate how blood flows through your heart.  A transesophageal echocardiogram (TEE).  Imaging scans. Your health care provider may not be able to find a cause for your shortness of breath after your exam. In this case, it is important to have a follow-up exam with your health care provider as directed.  TREATMENT  Treatment for shortness of breath depends on the cause of your symptoms and can vary greatly. HOME CARE INSTRUCTIONS   Do not smoke. Smoking is a common cause of shortness of breath. If you smoke, ask for help to quit.  Avoid being around chemicals or things that may bother your breathing, such as paint fumes and dust.  Rest as needed. Slowly resume your usual activities.  If medicines were prescribed, take them as  directed for the full length of time directed. This includes oxygen and any inhaled medicines.  Keep all follow-up appointments as directed by your health care provider. SEEK MEDICAL CARE IF:   Your condition does not improve in the time expected.  You have a hard time doing your normal activities even with rest.  You have any new symptoms. SEEK IMMEDIATE MEDICAL CARE IF:   Your shortness of breath gets worse.  You feel light-headed, faint, or develop a cough not controlled with medicines.  You start coughing up blood.  You have pain with breathing.  You have chest pain or pain in your arms, shoulders, or abdomen.  You have a fever.  You are unable to walk up stairs or exercise the way you normally do. MAKE SURE YOU:  Understand these instructions.  Will watch your condition.  Will get help right away if you are not doing well or get worse.   This information is not intended to replace advice given to you by your health care provider. Make sure you discuss any questions you have with your health care provider.   Document Released: 11/25/2000 Document Revised: 03/07/2013 Document Reviewed: 05/18/2011 Elsevier Interactive Patient Education [employer].

## 2015-07-15 ENCOUNTER — Encounter (HOSPITAL_COMMUNITY): Payer: Self-pay

## 2015-07-16 ENCOUNTER — Ambulatory Visit (INDEPENDENT_AMBULATORY_CARE_PROVIDER_SITE_OTHER): Payer: Medicare Other | Admitting: Pulmonary Disease

## 2015-07-16 ENCOUNTER — Telehealth: Payer: Self-pay | Admitting: Pulmonary Disease

## 2015-07-16 ENCOUNTER — Institutional Professional Consult (permissible substitution): Payer: Self-pay | Admitting: Pulmonary Disease

## 2015-07-16 VITALS — BP 138/88 | HR 104

## 2015-07-16 DIAGNOSIS — G4710 Hypersomnia, unspecified: Secondary | ICD-10-CM | POA: Diagnosis not present

## 2015-07-16 DIAGNOSIS — J4520 Mild intermittent asthma, uncomplicated: Secondary | ICD-10-CM | POA: Diagnosis not present

## 2015-07-16 DIAGNOSIS — R0683 Snoring: Secondary | ICD-10-CM | POA: Insufficient documentation

## 2015-07-16 DIAGNOSIS — K219 Gastro-esophageal reflux disease without esophagitis: Secondary | ICD-10-CM | POA: Insufficient documentation

## 2015-07-16 DIAGNOSIS — R0600 Dyspnea, unspecified: Secondary | ICD-10-CM | POA: Diagnosis not present

## 2015-07-16 DIAGNOSIS — G809 Cerebral palsy, unspecified: Secondary | ICD-10-CM | POA: Insufficient documentation

## 2015-07-16 DIAGNOSIS — J45901 Unspecified asthma with (acute) exacerbation: Secondary | ICD-10-CM | POA: Insufficient documentation

## 2015-07-16 NOTE — Patient Instructions (Signed)
1. We will schedule you for a lab sleep study. 2.We will schedule you for a breathing test, ABG, and heart ultrasound.  3. Cont albuterol 2 puffs every 4 hrs as needed for shortness of breath 4. Cont singulair 1 tab daily 5. Cont Prilosec 1 tab at bedtime.  Return to clinic in mid August

## 2015-07-16 NOTE — Assessment & Plan Note (Signed)
Wheelchair bound. Fair cough.  Needs help with ADLs.

## 2015-07-16 NOTE — Assessment & Plan Note (Signed)
Has snoring. Has gasping or choking. Denies witnessed apneas.  Sleeps 11 hrs. Has awakenings at night.  Has hypersomnia in am. Occasional naps.   Plan : 1. Needs an in lab sleep study. 2. May have issues with CPAP but will try it. If positive, try auto CPAP 5-15 cm water.

## 2015-07-16 NOTE — Assessment & Plan Note (Signed)
cont PPI at HS. Asp precaution. Recent cough and SOB when he lays flat. HOB 30 degrees.  

## 2015-07-16 NOTE — Assessment & Plan Note (Signed)
recnt ED visit 2/2 SOB. Not sure if related to asthma. Need to r/o cardiac issues. Needs 2DEcho.

## 2015-07-16 NOTE — Assessment & Plan Note (Addendum)
Recently to ER 2/2 SOB episodic. Not sure if related to nerves.  He has "wheezing" every now and then. Uses alb MDI 1x/week. Triggers for wheezing : change in weather and pollen. CXR (06/2015)  low lung volumes. Atelectasis at the bases. Likely with asthma.  Plan : 1. Needs PFT, ABG. 2. Continue albuterol 2 puffs every 4 hours as needed. He denies any issues using MDIs. May benefit from nebulizer. 3. Cont daily singulair.

## 2015-07-16 NOTE — Progress Notes (Signed)
Subjective:    Patient ID: Thomas Ramos, male    DOB: 1970/04/26, 45 y.o.   MRN: [id number]  HPI   This is the case of Thomas Ramos, 45 y.o. Male, who was referred by Dr. Anastasia Pall and Roe Coombs PA  in consultation regarding dyspnea  And possible OSA.   As you very well know, patient  Is a non smoker.  He is wheelchair bound 2/2 CP.  He lives by himself. Has help with ADLs.  Has not been admitted to hospital recently.  Recently to ER 2/2 SOB episodic. Not sure if related to nerves.  He has "wheezing" every now and then. Uses alb MDI 1x/week. Triggers for wheezing : change in weather and pollen.  Denies sinus issues.  Has GERD -- better. Has SOB when he lays flat.   Has snoring. Has gasping or choking. Denies witnessed apneas.  Sleeps 11 hrs. Has awakenings at night.  Has hypersomnia in am. Occasional naps.          Review of Systems  Constitutional: Negative.  Negative for fever and unexpected weight change.  HENT: Negative.  Negative for congestion, dental problem, ear pain, nosebleeds, postnasal drip, rhinorrhea, sinus pressure, sneezing, sore throat and trouble swallowing.   Eyes: Negative.  Negative for redness and itching.  Respiratory: Positive for wheezing. Negative for cough, chest tightness and shortness of breath.   Cardiovascular: Negative.  Negative for palpitations and leg swelling.  Gastrointestinal: Negative.  Negative for nausea and vomiting.  Endocrine: Negative.   Genitourinary: Negative.  Negative for dysuria.  Musculoskeletal: Positive for arthralgias. Negative for joint swelling.  Skin: Negative.  Negative for rash.  Allergic/Immunologic: Positive for environmental allergies.  Neurological: Positive for dizziness. Negative for headaches.  Hematological: Negative.  Does not bruise/bleed easily.  Psychiatric/Behavioral: Negative.  Negative for dysphoric mood. The patient is not nervous/anxious.    Past Medical History  Diagnosis Date  .  Motility disorder, esophageal   . Esophageal stricture   . Hypertension   . Pneumonia   . GERD (gastroesophageal reflux disease)   . CP (cerebral palsy) ([place])   . S/P Botox injection     approx every 4 months  . Seasonal allergies   . Environmental allergies     takes inhalers if needed  . 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 Cerebral palsy ([place])   . Diabetes mellitus without complication (HCC)     borderline   (-) CA, DVT, CAD  Family History  Problem Relation Age of Onset  . Hypertension Father   . Lung cancer Father   . Diabetes Mother      Past Surgical History  Procedure Laterality Date  . Tendon release    . Mouth surgery    . Esophagus surgery      stretched esophagus  . Legs    . Esophagogastroduodenoscopy N/A 12/21/2012    Procedure: ESOPHAGOGASTRODUODENOSCOPY (EGD);  Surgeon: Inda Castle, MD;  Location: [facility];  Service: Endoscopy;  Laterality: N/A;  . Botox injection N/A 12/21/2012    Procedure: BOTOX INJECTION;  Surgeon: Inda Castle, MD;  Location: [facility];  Service: Endoscopy;  Laterality: N/A;  . Esophagogastroduodenoscopy N/A 06/28/2014    Procedure: ESOPHAGOGASTRODUODENOSCOPY (EGD);  Surgeon: Inda Castle, MD;  Location: [facility];  Service: Endoscopy;  Laterality: N/A;  . Botox injection N/A 06/28/2014    Procedure: BOTOX INJECTION;  Surgeon: Inda Castle, MD;  Location: [facility];  Service: Endoscopy;  Laterality: N/A;  Social History   Social History  . Marital Status: Divorced    Spouse Name: N/A  . Number of Children: 0  . Years of Education: 6 th   Occupational History  . Disabled    Social History Main Topics  . Smoking status: Former Research scientist (life sciences)  . Smokeless tobacco: Former Systems developer     Comment: smoked one time  . Alcohol Use: 0.0 oz/week     Comment: rare  . Drug Use: No  . Sexual Activity: Not on file   Other Topics Concern  . Not on file   Social History Narrative   ** Merged History Encounter **         Patient lives in a [phone]   Disabled.   Education- 6 th grade   Left handed.   Caffeine- coffee one cup daily.         Lives in an apt.  (-) smoking.  Works Scientist, product/process development. Supervisor.  Drinks beer 2 bottles q weekend.   Allergies  Allergen Reactions  . Metronidazole Nausea And Vomiting  . Penicillins Hives and Nausea And Vomiting     Outpatient Prescriptions Prior to Visit  Medication Sig Dispense Refill  . amLODipine (NORVASC) 5 MG tablet Take 5 mg by mouth every evening.    . cetirizine (ZYRTEC) 10 MG tablet Take 10 mg by mouth daily.     . citalopram (CELEXA) 20 MG tablet Take 20 mg by mouth every evening.    Marland Kitchen CRESTOR 5 MG tablet Take 5 mg by mouth at bedtime.  5  . fluticasone (FLONASE) 50 MCG/ACT nasal spray Place 1 spray into both nostrils daily.     . montelukast (SINGULAIR) 10 MG tablet Take 10 mg by mouth daily.    Marland Kitchen omeprazole (PRILOSEC) 20 MG capsule Take 20 mg by mouth daily.    Marland Kitchen PROAIR HFA 108 (90 BASE) MCG/ACT inhaler Inhale 2 puffs into the lungs every 6 (six) hours as needed for wheezing or shortness of breath.     . ranitidine (ZANTAC) 150 MG capsule Take 150 mg by mouth 2 (two) times daily as needed for heartburn.     No facility-administered medications prior to visit.   Meds ordered this encounter  Medications  . busPIRone (BUSPAR) 7.5 MG tablet    Sig: Take 7.5 mg by mouth 3 (three) times daily.          Objective:   Physical Exam   Vitals:  Filed Vitals:   07/16/15 1042  BP: 138/88  Pulse: 104  SpO2: 93%    Constitutional/General:  Pleasant, well-nourished, well-developed, not in any distress,  Comfortably seating.  Well kempt Has RUE contracture (chronic)  There is no weight on file to calculate BMI. Wt Readings from Last 3 Encounters:  06/30/15 150 lb (68.04 kg)  06/28/14 161 lb (73.029 kg)  05/25/14 161 lb 6.4 oz (73.211 kg)      HEENT: Pupils equal and reactive to light and accommodation.  Anicteric sclerae. Normal nasal mucosa.   No oral  lesions,  mouth clear,  oropharynx clear, no postnasal drip. (-) Oral thrush. No dental caries.  Airway - Mallampati class III  Neck: No masses. Midline trachea. No JVD, (-) LAD. (-) bruits appreciated.  Respiratory/Chest: Grossly normal chest. (-) deformity. (-) Accessory muscle use.  Symmetric expansion. (-) Tenderness on palpation.  Resonant on percussion.  Diminished BS on both lower lung zones. (-) wheezing, crackles, rhonchi (-) egophony  Cardiovascular: Regular rate  and  rhythm, heart sounds normal, no murmur or gallops, no peripheral edema  Gastrointestinal:  Normal bowel sounds. Soft, non-tender. No hepatosplenomegaly.  (-) masses.   Musculoskeletal:  Normal muscle tone. Normal gait.   Extremities: Grossly normal. (-) clubbing, cyanosis.  (-) edema  Skin: (-) rash,lesions seen.   Neurological/Psychiatric : alert, oriented to time, place, person. Normal mood and affect           Assessment & Plan:  Hypersomnia Has snoring. Has gasping or choking. Denies witnessed apneas.  Sleeps 11 hrs. Has awakenings at night.  Has hypersomnia in am. Occasional naps.   Plan : 1. Needs an in lab sleep study. 2. May have issues with CPAP but will try it. If positive, try auto CPAP 5-15 cm water.  Asthma Recently to ER 2/2 SOB episodic. Not sure if related to nerves.  He has "wheezing" every now and then. Uses alb MDI 1x/week. Triggers for wheezing : change in weather and pollen. CXR (06/2015)  low lung volumes. Atelectasis at the bases. Likely with asthma.  Plan : 1. Needs PFT, ABG. 2. Continue albuterol 2 puffs every 4 hours as needed. He denies any issues using MDIs. May benefit from nebulizer. 3. Cont daily singulair.   Dyspnea recnt ED visit 2/2 SOB. Not sure if related to asthma. Need to r/o cardiac issues. Needs 2DEcho.   GERD (gastroesophageal reflux disease) cont PPI at HS. Asp precaution. Recent cough  and SOB when he lays flat. HOB 30 degrees.   Cerebral palsy ([place]) Wheelchair bound. Fair cough.  Needs help with ADLs.      Thank you very much for letting me participate in this patient's care. Please do not hesitate to give me a call if you have any questions or concerns regarding the treatment plan.   Patient will follow up with me in August.     J. Angelo A. de Dios, MD 07/16/2015   11:48 AM [facility] [place] Pager: [phone] Office: [phone], Fax: [phone]

## 2015-07-16 NOTE — Telephone Encounter (Signed)
Spoke with pt and explained tests that have been ordered. Pt voiced understanding. Nothing further needed.

## 2015-07-17 ENCOUNTER — Encounter (HOSPITAL_COMMUNITY): Payer: Self-pay | Admitting: Emergency Medicine

## 2015-07-17 ENCOUNTER — Emergency Department (HOSPITAL_COMMUNITY)
Admission: EM | Admit: 2015-07-17 | Discharge: 2015-07-18 | Disposition: A | Discharge status: Home / Self Care | Payer: Medicare Other | Attending: Emergency Medicine | Admitting: Emergency Medicine

## 2015-07-17 ENCOUNTER — Emergency Department (HOSPITAL_COMMUNITY): Payer: Medicare Other

## 2015-07-17 DIAGNOSIS — J4521 Mild intermittent asthma with (acute) exacerbation: Secondary | ICD-10-CM

## 2015-07-17 DIAGNOSIS — K219 Gastro-esophageal reflux disease without esophagitis: Secondary | ICD-10-CM | POA: Insufficient documentation

## 2015-07-17 DIAGNOSIS — Z87891 Personal history of nicotine dependence: Secondary | ICD-10-CM | POA: Diagnosis not present

## 2015-07-17 DIAGNOSIS — Z880 Allergy status to penicillin: Secondary | ICD-10-CM | POA: Insufficient documentation

## 2015-07-17 DIAGNOSIS — Z8701 Personal history of pneumonia (recurrent): Secondary | ICD-10-CM | POA: Diagnosis not present

## 2015-07-17 DIAGNOSIS — I10 Essential (primary) hypertension: Secondary | ICD-10-CM | POA: Diagnosis not present

## 2015-07-17 DIAGNOSIS — Z8669 Personal history of other diseases of the nervous system and sense organs: Secondary | ICD-10-CM | POA: Insufficient documentation

## 2015-07-17 DIAGNOSIS — Z7952 Long term (current) use of systemic steroids: Secondary | ICD-10-CM | POA: Diagnosis not present

## 2015-07-17 DIAGNOSIS — Z79899 Other long term (current) drug therapy: Secondary | ICD-10-CM | POA: Diagnosis not present

## 2015-07-17 DIAGNOSIS — R0602 Shortness of breath: Secondary | ICD-10-CM | POA: Diagnosis present

## 2015-07-17 LAB — BASIC METABOLIC PANEL
ANION GAP: 9 (ref 5–15)
BUN: 12 mg/dL (ref 6–20)
CO2: 24 mmol/L (ref 22–32)
Calcium: 9.3 mg/dL (ref 8.9–10.3)
Chloride: 105 mmol/L (ref 101–111)
Creatinine, Ser: 0.64 mg/dL (ref 0.61–1.24)
GFR calc Af Amer: >60 mL/min (ref >60)
GLUCOSE: 221 mg/dL — AB (ref 65–99)
POTASSIUM: 3.5 mmol/L (ref 3.5–5.1)
Sodium: 138 mmol/L (ref 135–145)

## 2015-07-17 LAB — CBC
HCT: 38.4 % — ABNORMAL LOW (ref 39.0–52.0)
HEMOGLOBIN: 12.6 g/dL — AB (ref 13.0–17.0)
MCH: 26.9 pg (ref 26.0–34.0)
MCHC: 32.8 g/dL (ref 30.0–36.0)
MCV: 81.9 fL (ref 78.0–100.0)
PLATELETS: 191 10*3/uL (ref 150–400)
RBC: 4.69 MIL/uL (ref 4.22–5.81)
RDW: 13.2 % (ref 11.5–15.5)
WBC: 6.8 10*3/uL (ref 4.0–10.5)

## 2015-07-17 LAB — I-STAT TROPONIN, ED: TROPONIN I, POC: 0 ng/mL (ref 0.00–0.08)

## 2015-07-17 NOTE — ED Notes (Signed)
Pt brought to ED by [facility] from home for CP 9/10, mostly increase with deep breath, pt has been seen during the past few weeks for the same complain with no change on his condition, Hx of bell palsy and asthma, no n/v/d denies fevers or chills.

## 2015-07-17 NOTE — ED Provider Notes (Signed)
CSN: [id number]     Arrival date & time 07/17/15  2240 History  By signing my name below, I, [name], attest that this documentation has been prepared under the direction and in the presence of Orpah Greek, MD. Electronically Signed: [name], ED Scribe. 07/17/2015. 11:09 PM.   Chief Complaint  Patient presents with  . Chest Pain   The history is provided by the patient. No language interpreter was used.  HPI Comments: Thomas Ramos is a 45 y.o. Male with a hx of HTN, pneumonia ,CP, motility disorder, and DM brought in by EMS who presents to the Emergency Department complaining of right sided, sharp, tight chest pain onset 2 hours ago. Pt has been seen in the ED for the same over the past few weeks. He reports worsening pain with deep breathing with associated SOB and cough. He has not taken anything for his symptoms. He denies fever, chills, nausea, vomiting, or diarrhea.   Past Medical History  Diagnosis Date  . Motility disorder, esophageal   . Esophageal stricture   . Hypertension   . Pneumonia   . GERD (gastroesophageal reflux disease)   . CP (cerebral palsy) ([place])   . S/P Botox injection     approx every 4 months  . Seasonal allergies   . Environmental allergies     takes inhalers if needed  . 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 Cerebral palsy ([place])   . Diabetes mellitus without complication ([place])     borderline   Past Surgical History  Procedure Laterality Date  . Tendon release    . Mouth surgery    . Esophagus surgery      stretched esophagus  . Legs    . Esophagogastroduodenoscopy N/A 12/21/2012    Procedure: ESOPHAGOGASTRODUODENOSCOPY (EGD);  Surgeon: Inda Castle, MD;  Location: [facility];  Service: Endoscopy;  Laterality: N/A;  . Botox injection N/A 12/21/2012    Procedure: BOTOX INJECTION;  Surgeon: Inda Castle, MD;  Location: [facility];  Service: Endoscopy;  Laterality: N/A;  . Esophagogastroduodenoscopy N/A 06/28/2014     Procedure: ESOPHAGOGASTRODUODENOSCOPY (EGD);  Surgeon: Inda Castle, MD;  Location: [facility];  Service: Endoscopy;  Laterality: N/A;  . Botox injection N/A 06/28/2014    Procedure: BOTOX INJECTION;  Surgeon: Inda Castle, MD;  Location: [facility];  Service: Endoscopy;  Laterality: N/A;   Family History  Problem Relation Age of Onset  . Hypertension Father   . Lung cancer Father   . Diabetes Mother    Social History  Substance Use Topics  . Smoking status: Former Research scientist (life sciences)  . Smokeless tobacco: Former Systems developer     Comment: smoked one time  . Alcohol Use: 0.0 oz/week     Comment: rare    Review of Systems  Constitutional: Negative for fever and chills.  Respiratory: Positive for cough and shortness of breath.   Cardiovascular: Positive for chest pain.  Gastrointestinal: Negative for nausea, vomiting and diarrhea.  All other systems reviewed and are negative.  Allergies  Metronidazole and Penicillins  Home Medications   Prior to Admission medications   Medication Sig Start Date End Date Taking? Authorizing Provider  amLODipine (NORVASC) 5 MG tablet Take 5 mg by mouth every evening.   Yes Historical Provider, MD  busPIRone (BUSPAR) 7.5 MG tablet Take 7.5 mg by mouth 3 (three) times daily.   Yes Historical Provider, MD  cetirizine (ZYRTEC) 10 MG tablet Take 10 mg by mouth daily.  03/01/14  Yes Historical Provider, MD  citalopram (CELEXA) 20 MG tablet Take 20 mg by mouth every evening.   Yes Historical Provider, MD  CRESTOR 5 MG tablet Take 5 mg by mouth at bedtime. 04/22/14  Yes Historical Provider, MD  fluticasone (FLONASE) 50 MCG/ACT nasal spray Place 1 spray into both nostrils daily.  03/01/14  Yes Historical Provider, MD  montelukast (SINGULAIR) 10 MG tablet Take 10 mg by mouth daily.   Yes Historical Provider, MD  omeprazole (PRILOSEC) 20 MG capsule Take 20 mg by mouth daily.   Yes Historical Provider, MD  PROAIR HFA 108 (90 BASE) MCG/ACT inhaler Inhale 2 puffs into the  lungs every 6 (six) hours as needed for wheezing or shortness of breath.  12/29/12  Yes Historical Provider, MD  ranitidine (ZANTAC) 150 MG capsule Take 150 mg by mouth 2 (two) times daily as needed for heartburn.   Yes Historical Provider, MD   BP 109/67 mmHg  Pulse 78  Temp(Src) 97.9 F (36.6 C) (Oral)  Resp 21  Ht 4\' 11"  (1.499 m)  Wt 150 lb (68.04 kg)  BMI 30.28 kg/m2  SpO2 96% Physical Exam  Constitutional: He is oriented to person, place, and time. He appears well-developed and well-nourished. No distress.  HENT:  Head: Normocephalic and atraumatic.  Right Ear: Hearing normal.  Left Ear: Hearing normal.  Nose: Nose normal.  Mouth/Throat: Oropharynx is clear and moist and mucous membranes are normal.  Eyes: Conjunctivae and EOM are normal. Pupils are equal, round, and reactive to light.  Neck: Normal range of motion. Neck supple.  Cardiovascular: Regular rhythm, S1 normal and S2 normal.  Exam reveals no gallop and no friction rub.   No murmur heard. Pulmonary/Chest: Effort normal. No respiratory distress. He has decreased breath sounds. He has no wheezes. He has rhonchi. He exhibits no tenderness.  Diminished breath sounds with rhonchi at the bases  Abdominal: Soft. Normal appearance and bowel sounds are normal. There is no hepatosplenomegaly. There is no tenderness. There is no rebound, no guarding, no tenderness at McBurney's point and negative Murphy's sign. No hernia.  Musculoskeletal: Normal range of motion.  Neurological: He is alert and oriented to person, place, and time. He has normal strength. No cranial nerve deficit or sensory deficit. Coordination normal. GCS eye subscore is 4. GCS verbal subscore is 5. GCS motor subscore is 6.  Skin: Skin is warm, dry and intact. No rash noted. No cyanosis.  Psychiatric: He has a normal mood and affect. His speech is normal and behavior is normal. Thought content normal.  Nursing note and vitals reviewed.   ED Course  Procedures  (including critical care time) DIAGNOSTIC STUDIES: Oxygen Saturation is 97% on RA, normal by my interpretation.    COORDINATION OF CARE: 11:08 PM-Discussed treatment plan which includes labs and x-ray with pt at bedside and pt agreed to plan.    Labs Review Labs Reviewed  BASIC METABOLIC PANEL - Abnormal; Notable for the following:    Glucose, Bld 221 (*)    All other components within normal limits  CBC - Abnormal; Notable for the following:    Hemoglobin 12.6 (*)    HCT 38.4 (*)    All other components within normal limits  I-STAT TROPOININ, ED    Imaging Review Dg Chest 2 View  07/17/2015  CLINICAL DATA:  Right-sided chest pain, onset 2 hours ago. EXAM: CHEST  2 VIEW COMPARISON:  07/07/2015 FINDINGS: The lungs are clear. The pulmonary vasculature is normal. Heart size is normal.  Hilar and mediastinal contours are unremarkable. There is no pleural effusion. IMPRESSION: No active cardiopulmonary disease. Electronically Signed   By: Andreas Newport M.D.   On: 07/17/2015 23:40   I have personally reviewed and evaluated these images and lab results as part of my medical decision-making.   EKG Interpretation   Date/Time:  Wednesday Jul 17 2015 22:50:31 EDT Ventricular Rate:  88 PR Interval:  156 QRS Duration: 94 QT Interval:  370 QTC Calculation: 448 R Axis:   95 Text Interpretation:  Sinus rhythm Borderline right axis deviation  Borderline T abnormalities, anterior leads No significant change since  last tracing Confirmed by Shakirra Buehler  MD, [place] [phone]) on 07/17/2015  11:23:01 PM      MDM   Final diagnoses:  None    Presents to the ER for evaluation of shortness of breath. Patient does have a history of asthma and pneumonia. Patient has a history of esophageal motility disorder, but did have endoscopy and Botox injection last month. He does not appear to have aspirated today. Oxygen saturations are normal. Chest x-ray is clear. Had slight rhonchi at arrival but these  cleared with cough and DuoNeb. Patient appropriate for discharge and follow-up with primary care and pulmonology.  I personally performed the services described in this documentation, which was scribed in my presence. The recorded information has been reviewed and is accurate.    Orpah Greek, MD 07/18/15 0230

## 2015-07-18 DIAGNOSIS — J4521 Mild intermittent asthma with (acute) exacerbation: Secondary | ICD-10-CM | POA: Diagnosis not present

## 2015-07-18 MED ORDER — IPRATROPIUM-ALBUTEROL 0.5-2.5 (3) MG/3ML IN SOLN
3.0000 mL | Freq: Once | RESPIRATORY_TRACT | Status: AC
Start: 2015-07-18 — End: 2015-07-18
  Administered 2015-07-18: 3 mL via RESPIRATORY_TRACT
  Filled 2015-07-18: qty 3

## 2015-07-18 NOTE — Discharge Instructions (Signed)
Bronchospasm, Adult  A bronchospasm is a spasm or tightening of the airways going into the lungs. During a bronchospasm breathing becomes more difficult because the airways get smaller. When this happens there can be coughing, a whistling sound when breathing (wheezing), and difficulty breathing. Bronchospasm is often associated with asthma, but not all patients who experience a bronchospasm have asthma.  CAUSES   A bronchospasm is caused by inflammation or irritation of the airways. The inflammation or irritation may be triggered by:   · Allergies (such as to animals, pollen, food, or mold). Allergens that cause bronchospasm may cause wheezing immediately after exposure or many hours later.    · Infection. Viral infections are believed to be the most common cause of bronchospasm.    · Exercise.    · Irritants (such as pollution, cigarette smoke, strong odors, aerosol sprays, and paint fumes).    · Weather changes. Winds increase molds and pollens in the air. Rain refreshes the air by washing irritants out. Cold air may cause inflammation.    · Stress and emotional upset.    SIGNS AND SYMPTOMS   · Wheezing.    · Excessive nighttime coughing.    · Frequent or severe coughing with a simple cold.    · Chest tightness.    · Shortness of breath.    DIAGNOSIS   Bronchospasm is usually diagnosed through a history and physical exam. Tests, such as chest X-rays, are sometimes done to look for other conditions.  TREATMENT   · Inhaled medicines can be given to open up your airways and help you breathe. The medicines can be given using either an inhaler or a nebulizer machine.  · Corticosteroid medicines may be given for severe bronchospasm, usually when it is associated with asthma.  HOME CARE INSTRUCTIONS   · Always have a plan prepared for seeking medical care. Know when to call your health care provider and local emergency services (911 in the [place] Know where you can access local emergency care.  · Only take medicines as  directed by your health care provider.  · If you were prescribed an inhaler or nebulizer machine, ask your health care provider to explain how to use it correctly. Always use a spacer with your inhaler if you were given one.  · It is necessary to remain calm during an attack. Try to relax and breathe more slowly.   · Control your home environment in the following ways:      Change your heating and air conditioning filter at least once a month.      Limit your use of fireplaces and wood stoves.    Do not smoke and do not allow smoking in your home.      Avoid exposure to perfumes and fragrances.      Get rid of pests (such as roaches and mice) and their droppings.      Throw away plants if you see mold on them.      Keep your house clean and dust free.      Replace carpet with wood, tile, or vinyl flooring. Carpet can trap dander and dust.      Use allergy-proof pillows, mattress covers, and box spring covers.      Wash bed sheets and blankets every week in hot water and dry them in a dryer.      Use blankets that are made of polyester or cotton.      Wash hands frequently.  SEEK MEDICAL CARE IF:   · You have muscle aches.    · You have chest pain.    · The sputum changes from clear or   white to yellow, green, gray, or bloody.    · The sputum you cough up gets thicker.    · There are problems that may be related to the medicine you are given, such as a rash, itching, swelling, or trouble breathing.    SEEK IMMEDIATE MEDICAL CARE IF:   · You have worsening wheezing and coughing even after taking your prescribed medicines.    · You have increased difficulty breathing.    · You develop severe chest pain.  MAKE SURE YOU:   · Understand these instructions.  · Will watch your condition.  · Will get help right away if you are not doing well or get worse.     This information is not intended to replace advice given to you by your health care provider. Make sure you discuss any questions you have with your health care  provider.     Document Released: 03/05/2003 Document Revised: 03/23/2014 Document Reviewed: 08/22/2012  Elsevier Interactive Patient Education ©2016 Elsevier Inc.

## 2015-07-23 ENCOUNTER — Ambulatory Visit (HOSPITAL_COMMUNITY)
Admission: RE | Admit: 2015-07-23 | Discharge: 2015-07-23 | Disposition: A | Discharge status: Home / Self Care | Payer: Medicare Other | Source: Ambulatory Visit | Attending: Pulmonary Disease | Admitting: Pulmonary Disease

## 2015-07-23 DIAGNOSIS — R0600 Dyspnea, unspecified: Secondary | ICD-10-CM | POA: Insufficient documentation

## 2015-07-23 DIAGNOSIS — E785 Hyperlipidemia, unspecified: Secondary | ICD-10-CM | POA: Diagnosis not present

## 2015-07-23 DIAGNOSIS — I119 Hypertensive heart disease without heart failure: Secondary | ICD-10-CM | POA: Insufficient documentation

## 2015-07-23 LAB — BLOOD GAS, ARTERIAL
ACID-BASE EXCESS: 1.8 mmol/L (ref 0.0–2.0)
BICARBONATE: 25.4 meq/L — AB (ref 20.0–24.0)
DRAWN BY: 211791
FIO2: 0.21
O2 SAT: 94.5 %
PO2 ART: 69.3 mmHg — AB (ref 80.0–100.0)
Patient temperature: 98.6
TCO2: 26.5 mmol/L (ref 0–100)
pCO2 arterial: 36.7 mmHg (ref 35.0–45.0)
pH, Arterial: 7.454 — ABNORMAL HIGH (ref 7.350–7.450)

## 2015-07-23 NOTE — Progress Notes (Signed)
  Echocardiogram 2D Echocardiogram has been performed.  Diamond Nickel 07/23/2015, 2:59 PM

## 2015-07-23 NOTE — Progress Notes (Signed)
Attempted to perform PFT on patient.  Patient unable to perform any of the tests due to a strong gag reflex.  Attempted multiple times to complete test, with mouthpiece and without.  Patient unable to control the gag reflex.  He wanted to stop the test at this time.

## 2015-07-24 ENCOUNTER — Telehealth: Payer: Self-pay | Admitting: Pulmonary Disease

## 2015-07-24 NOTE — Telephone Encounter (Signed)
LM for pt x 1  

## 2015-07-24 NOTE — Telephone Encounter (Signed)
Result Notes     Notes Recorded by Rush Landmark, MD on 07/23/2015 at 7:02 PM Echo with diastolic dysfxn 2/2 obesity and htn. Treat htn and lose weight.   Notes Recorded by Rush Landmark, MD on 07/23/2015 at 6:52 PM abg ok. -------------------- Pt is aware of results. Nothing further was needed.

## 2015-07-24 NOTE — Telephone Encounter (Signed)
Patient is returning call, CB is [phone]

## 2015-07-27 ENCOUNTER — Emergency Department (HOSPITAL_COMMUNITY): Payer: Medicare Other

## 2015-07-27 ENCOUNTER — Emergency Department (HOSPITAL_COMMUNITY)
Admission: EM | Admit: 2015-07-27 | Discharge: 2015-07-28 | Disposition: A | Discharge status: Home / Self Care | Payer: Medicare Other | Attending: Emergency Medicine | Admitting: Emergency Medicine

## 2015-07-27 ENCOUNTER — Encounter (HOSPITAL_COMMUNITY): Payer: Self-pay | Admitting: Emergency Medicine

## 2015-07-27 DIAGNOSIS — Z87891 Personal history of nicotine dependence: Secondary | ICD-10-CM | POA: Diagnosis not present

## 2015-07-27 DIAGNOSIS — I10 Essential (primary) hypertension: Secondary | ICD-10-CM | POA: Insufficient documentation

## 2015-07-27 DIAGNOSIS — Z8701 Personal history of pneumonia (recurrent): Secondary | ICD-10-CM | POA: Diagnosis not present

## 2015-07-27 DIAGNOSIS — Z79899 Other long term (current) drug therapy: Secondary | ICD-10-CM | POA: Insufficient documentation

## 2015-07-27 DIAGNOSIS — Z8669 Personal history of other diseases of the nervous system and sense organs: Secondary | ICD-10-CM | POA: Diagnosis not present

## 2015-07-27 DIAGNOSIS — Z880 Allergy status to penicillin: Secondary | ICD-10-CM | POA: Insufficient documentation

## 2015-07-27 DIAGNOSIS — K219 Gastro-esophageal reflux disease without esophagitis: Secondary | ICD-10-CM | POA: Diagnosis not present

## 2015-07-27 DIAGNOSIS — Z7951 Long term (current) use of inhaled steroids: Secondary | ICD-10-CM | POA: Diagnosis not present

## 2015-07-27 DIAGNOSIS — R0602 Shortness of breath: Secondary | ICD-10-CM | POA: Diagnosis not present

## 2015-07-27 MED ORDER — IPRATROPIUM-ALBUTEROL 0.5-2.5 (3) MG/3ML IN SOLN
3.0000 mL | RESPIRATORY_TRACT | Status: AC
  Administered 2015-07-27 (×3): 3 mL via RESPIRATORY_TRACT
  Filled 2015-07-27: qty 6

## 2015-07-27 MED ORDER — DEXAMETHASONE 4 MG PO TABS
10.0000 mg | ORAL_TABLET | Freq: Once | ORAL | Status: AC
  Administered 2015-07-27: 10 mg via ORAL
  Filled 2015-07-27: qty 3

## 2015-07-27 NOTE — Discharge Instructions (Signed)
Use your inhaler ever 4 hours while awake.  Shortness of Breath Shortness of breath means you have trouble breathing. Shortness of breath needs medical care right away. HOME CARE   Do not smoke.  Avoid being around chemicals or things (paint fumes, dust) that may bother your breathing.  Rest as needed. Slowly begin your normal activities.  Only take medicines as told by your doctor.  Keep all doctor visits as told. GET HELP RIGHT AWAY IF:   Your shortness of breath gets worse.  You feel lightheaded, pass out (faint), or have a cough that is not helped by medicine.  You cough up blood.  You have pain with breathing.  You have pain in your chest, arms, shoulders, or belly (abdomen).  You have a fever.  You cannot walk up stairs or exercise the way you normally do.  You do not get better in the time expected.  You have a hard time doing normal activities even with rest.  You have problems with your medicines.  You have any new symptoms. MAKE SURE YOU:  Understand these instructions.  Will watch your condition.  Will get help right away if you are not doing well or get worse.   This information is not intended to replace advice given to you by your health care provider. Make sure you discuss any questions you have with your health care provider.   Document Released: 08/19/2007 Document Revised: 03/07/2013 Document Reviewed: 05/18/2011 Elsevier Interactive Patient Education [employer].

## 2015-07-27 NOTE — ED Provider Notes (Signed)
CSN: [id number]     Arrival date & time 07/27/15  2213 History   First MD Initiated Contact with Patient 07/27/15 2222     Chief Complaint  Patient presents with  . Shortness of Breath     (Consider location/radiation/quality/duration/timing/severity/associated sxs/prior Treatment) Patient is a 45 y.o. male presenting with shortness of breath. The history is provided by the patient.  Shortness of Breath Severity:  Moderate Onset quality:  Gradual Duration:  1 week Timing:  Constant Progression:  Worsening Chronicity:  New Relieved by:  Nothing Worsened by:  Nothing tried Ineffective treatments:  None tried Associated symptoms: no abdominal pain, no chest pain, no fever, no headaches, no rash and no vomiting    45 yo M With a chief complaint shortness of breath. Is been going on for quite some time. The patient has been having some trouble breathing denies chest pain. Feels like the last few times that he had these symptoms. Denies fevers or chills denies cough or congestion.  Past Medical History  Diagnosis Date  . Motility disorder, esophageal   . Esophageal stricture   . Hypertension   . Pneumonia   . GERD (gastroesophageal reflux disease)   . CP (cerebral palsy) ([place])   . S/P Botox injection     approx every 4 months  . Seasonal allergies   . Environmental allergies     takes inhalers if needed  . 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 Cerebral palsy ([place])   . Diabetes mellitus without complication ([place])     borderline   Past Surgical History  Procedure Laterality Date  . Tendon release    . Mouth surgery    . Esophagus surgery      stretched esophagus  . Legs    . Esophagogastroduodenoscopy N/A 12/21/2012    Procedure: ESOPHAGOGASTRODUODENOSCOPY (EGD);  Surgeon: Inda Castle, MD;  Location: [facility];  Service: Endoscopy;  Laterality: N/A;  . Botox injection N/A 12/21/2012    Procedure: BOTOX INJECTION;  Surgeon: Inda Castle, MD;  Location: [facility];  Service: Endoscopy;  Laterality: N/A;  . Esophagogastroduodenoscopy N/A 06/28/2014    Procedure: ESOPHAGOGASTRODUODENOSCOPY (EGD);  Surgeon: Inda Castle, MD;  Location: [facility];  Service: Endoscopy;  Laterality: N/A;  . Botox injection N/A 06/28/2014    Procedure: BOTOX INJECTION;  Surgeon: Inda Castle, MD;  Location: [facility];  Service: Endoscopy;  Laterality: N/A;   Family History  Problem Relation Age of Onset  . Hypertension Father   . Lung cancer Father   . Diabetes Mother    Social History  Substance Use Topics  . Smoking status: Former Research scientist (life sciences)  . Smokeless tobacco: Former Systems developer     Comment: smoked one time  . Alcohol Use: 0.0 oz/week     Comment: rare    Review of Systems  Constitutional: Negative for fever and chills.  HENT: Negative for congestion and facial swelling.   Eyes: Negative for discharge and visual disturbance.  Respiratory: Positive for shortness of breath.   Cardiovascular: Negative for chest pain and palpitations.  Gastrointestinal: Negative for vomiting, abdominal pain and diarrhea.  Musculoskeletal: Negative for myalgias and arthralgias.  Skin: Negative for color change and rash.  Neurological: Negative for tremors, syncope and headaches.  Psychiatric/Behavioral: Negative for confusion and dysphoric mood.      Allergies  Metronidazole and Penicillins  Home Medications   Prior to Admission medications   Medication Sig Start Date End Date Taking? Authorizing Provider  amLODipine (NORVASC) 5  MG tablet Take 5 mg by mouth every evening.    Historical Provider, MD  busPIRone (BUSPAR) 7.5 MG tablet Take 7.5 mg by mouth 3 (three) times daily.    Historical Provider, MD  cetirizine (ZYRTEC) 10 MG tablet Take 10 mg by mouth daily.  03/01/14   Historical Provider, MD  citalopram (CELEXA) 20 MG tablet Take 20 mg by mouth every evening.    Historical Provider, MD  CRESTOR 5 MG tablet Take 5 mg by mouth at bedtime. 04/22/14   Historical  Provider, MD  fluticasone (FLONASE) 50 MCG/ACT nasal spray Place 1 spray into both nostrils daily.  03/01/14   Historical Provider, MD  montelukast (SINGULAIR) 10 MG tablet Take 10 mg by mouth daily.    Historical Provider, MD  omeprazole (PRILOSEC) 20 MG capsule Take 20 mg by mouth daily.    Historical Provider, MD  PROAIR HFA 108 (90 BASE) MCG/ACT inhaler Inhale 2 puffs into the lungs every 6 (six) hours as needed for wheezing or shortness of breath.  12/29/12   Historical Provider, MD  ranitidine (ZANTAC) 150 MG capsule Take 150 mg by mouth 2 (two) times daily as needed for heartburn.    Historical Provider, MD   BP 140/80 mmHg  Pulse 91  Temp(Src) 97.9 F (36.6 C) (Oral)  Resp 16  SpO2 95% Physical Exam  Constitutional: He is oriented to person, place, and time. He appears well-developed and well-nourished.  HENT:  Head: Normocephalic and atraumatic.  Eyes: EOM are normal. Pupils are equal, round, and reactive to light.  Neck: Normal range of motion. Neck supple. No JVD present.  Cardiovascular: Normal rate and regular rhythm.  Exam reveals no gallop and no friction rub.   No murmur heard. Pulmonary/Chest: No respiratory distress. He has no wheezes. He has no rales. He exhibits no tenderness.  Diminished respiratory effort   Abdominal: He exhibits no distension. There is no rebound and no guarding.  Musculoskeletal: Normal range of motion.  Neurological: He is alert and oriented to person, place, and time.  Skin: No rash noted. No pallor.  Psychiatric: He has a normal mood and affect. His behavior is normal.  Nursing note and vitals reviewed.   ED Course  Procedures (including critical care time) Labs Review Labs Reviewed - No data to display  Imaging Review Dg Chest Port 1 View  07/27/2015  CLINICAL DATA:  Short of breath EXAM: PORTABLE CHEST 1 VIEW COMPARISON:  07/17/2015 FINDINGS: Hypoventilation with decreased lung volume. Bibasilar atelectasis has progressed in the  interval. Negative for heart failure or effusion. Retrocardiac density may represent hiatal hernia. IMPRESSION: Hypoventilation with bibasilar atelectasis. Electronically Signed   By: Franchot Gallo M.D.   On: 07/27/2015 23:00   I have personally reviewed and evaluated these images and lab results as part of my medical decision-making.   EKG Interpretation None      MDM   Final diagnoses:  SOB (shortness of breath)    45 yo M with a chief complaint of shortness of breath. Patient was seen recently for similar complaints. He normally gets a chest x-ray and has improvement with breathing treatments. Has had pneumonia in the past. Will give a dose of Decadron 3 DuoNeb's back-to-back and reassess.  Symptoms improved with duonebs, mild increase in aeration.    11:30 PM:  I have discussed the diagnosis/risks/treatment options with the patient and family and believe the pt to be eligible for discharge home to follow-up with PCP. We also discussed returning to the  ED immediately if new or worsening sx occur. We discussed the sx which are most concerning (e.g., sudden worsening pain, fever, inability to tolerate by mouth) that necessitate immediate return. Medications administered to the patient during their visit and any new prescriptions provided to the patient are listed below.  Medications given during this visit Medications  ipratropium-albuterol (DUONEB) 0.5-2.5 (3) MG/3ML nebulizer solution 3 mL (3 mLs Nebulization Given 07/27/15 2317)  dexamethasone (DECADRON) tablet 10 mg (10 mg Oral Given 07/27/15 2256)    New Prescriptions   No medications on file    The patient appears reasonably screen and/or stabilized for discharge and I doubt any other medical condition or other [facility] requiring further screening, evaluation, or treatment in the ED at this time prior to discharge.       Deno Etienne, DO 07/27/15 2331

## 2015-07-27 NOTE — ED Notes (Signed)
Pt in reporting SOB. Seen here on the 9th for asthma exacerbation. Has been using inhaler but states its providing no relief. 96 RA

## 2015-07-28 NOTE — ED Notes (Signed)
Leaving with [facility] at this time. 

## 2015-08-01 ENCOUNTER — Ambulatory Visit: Payer: Medicare Other | Attending: Physician Assistant | Admitting: Physical Therapy

## 2015-08-01 DIAGNOSIS — G809 Cerebral palsy, unspecified: Secondary | ICD-10-CM | POA: Insufficient documentation

## 2015-08-01 DIAGNOSIS — M6281 Muscle weakness (generalized): Secondary | ICD-10-CM | POA: Diagnosis present

## 2015-08-04 NOTE — Therapy (Signed)
[facility] [address] [place], [place], [address] Phone: [phone]   Fax:  [phone]  Physical Therapy Evaluation  Patient Details  Name: Thomas Ramos MRN: [id number] Date of Birth: [birth year] Referring Provider: Dr. Mattie Marlin  Encounter Date: 08/01/2015    Past Medical History  Diagnosis Date  . Motility disorder, esophageal   . Esophageal stricture   . Hypertension   . Pneumonia   . GERD (gastroesophageal reflux disease)   . CP (cerebral palsy) ([place])   . S/P Botox injection     approx every 4 months  . Seasonal allergies   . Environmental allergies     takes inhalers if needed  . 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 Cerebral palsy ([place])   . Diabetes mellitus without complication ([place])     borderline    Past Surgical History  Procedure Laterality Date  . Tendon release    . Mouth surgery    . Esophagus surgery      stretched esophagus  . Legs    . Esophagogastroduodenoscopy N/A 12/21/2012    Procedure: ESOPHAGOGASTRODUODENOSCOPY (EGD);  Surgeon: Inda Castle, MD;  Location: [facility];  Service: Endoscopy;  Laterality: N/A;  . Botox injection N/A 12/21/2012    Procedure: BOTOX INJECTION;  Surgeon: Inda Castle, MD;  Location: [facility];  Service: Endoscopy;  Laterality: N/A;  . Esophagogastroduodenoscopy N/A 06/28/2014    Procedure: ESOPHAGOGASTRODUODENOSCOPY (EGD);  Surgeon: Inda Castle, MD;  Location: [facility];  Service: Endoscopy;  Laterality: N/A;  . Botox injection N/A 06/28/2014    Procedure: BOTOX INJECTION;  Surgeon: Inda Castle, MD;  Location: [facility];  Service: Endoscopy;  Laterality: N/A;    There were no vitals filed for this visit.       Subjective Assessment - 08/04/15 1745    Subjective Pt presents in power wheelchair - very poor positioning noted; pt is accompanied by caregiver; pt states "I want to increase motion and lose some weight"    Patient is accompained by: --  caregiver - Joe   Pertinent History congenital CP   Patient Stated Goals "increase motion": lose weight   Currently in Pain? No/denies            [facility] PT Assessment - 08/04/15 0001    Assessment   Medical Diagnosis CP   Referring Provider Dr. Mattie Marlin   Onset Date/Surgical Date --  Congenital   Precautions   Precautions Fall   Balance Screen   Has the patient fallen in the past 6 months No   Has the patient had a decrease in activity level because of a fear of falling?  No   Is the patient reluctant to leave their home because of a fear of falling?  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 = -50 degrees L knee ext. 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 09-01-15   Time 4   Period Weeks   Status New   PT LONG TERM GOAL #2   Title Perform 10" on SCifit with LE's to demo increased endurance/activity tolerance.  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  Pt is nonambulatory and uses a power wheelchair for mobility - however has very poor positioning in this wheelchair.  Pt would benefit from PT for HEP instruction and for caregiver education for pt assistance with HEP.                                                                                   Rehab Potential Fair   PT Frequency 1x / week   PT Duration 4 weeks   PT Treatment/Interventions ADLs/Self Care Home Management;Therapeutic activities;Therapeutic exercise;Balance training;Functional mobility training;Neuromuscular re-education;Patient/family education   PT Next Visit Plan HEP for UE and LE strengthening - with theraband?;  trial UBE (table top)   PT Home Exercise Plan see above   Consulted and Agree with Plan of Care Patient;Family member/caregiver   Family Member Consulted Joe - caregiver      Patient will benefit from skilled therapeutic intervention in order to improve the following deficits and impairments:  Decreased strength, Impaired tone, Postural dysfunction, Decreased balance, Decreased mobility, Impaired flexibility, Increased muscle spasms, Impaired UE functional use  Visit Diagnosis: Cerebral palsy, unspecified ([place]) - Plan: PT plan of care cert/re-cert  Muscle weakness (generalized) - Plan: PT plan of care cert/re-cert     Problem List Patient Active Problem List   Diagnosis Date Noted  . Hypersomnia 07/16/2015  . Dyspnea 07/16/2015  . Asthma 07/16/2015  . GERD (gastroesophageal reflux disease) 07/16/2015  . Cerebral palsy ([place]) 07/16/2015  . Hematemesis with nausea   . Influenza with pneumonia   . Aspiration pneumonia ([place]) 05/24/2014  . HTN (hypertension) 05/24/2014  . Fever 05/24/2014  . Dysphagia 09/16/2010  . 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

## 2015-08-06 ENCOUNTER — Telehealth: Payer: Self-pay | Admitting: Pulmonary Disease

## 2015-08-06 NOTE — Telephone Encounter (Signed)
Spoke with pt. He was scheduled for a sleep study on 08/07/15. The sleep lab canceled this due to the pt needing someone to be with the entire test. His sleep study was rescheduled to 09/20/15. The pt was confused and needed this explained to him. I answered all of his questions. Nothing further was needed at this time.

## 2015-08-07 ENCOUNTER — Encounter (HOSPITAL_COMMUNITY): Payer: Self-pay | Admitting: Emergency Medicine

## 2015-08-07 ENCOUNTER — Emergency Department (HOSPITAL_COMMUNITY)
Admission: EM | Admit: 2015-08-07 | Discharge: 2015-08-08 | Disposition: A | Discharge status: Home / Self Care | Payer: Medicare Other | Attending: Emergency Medicine | Admitting: Emergency Medicine

## 2015-08-07 ENCOUNTER — Encounter (HOSPITAL_BASED_OUTPATIENT_CLINIC_OR_DEPARTMENT_OTHER): Payer: Self-pay

## 2015-08-07 ENCOUNTER — Emergency Department (HOSPITAL_COMMUNITY): Payer: Medicare Other

## 2015-08-07 DIAGNOSIS — R0602 Shortness of breath: Secondary | ICD-10-CM | POA: Diagnosis not present

## 2015-08-07 DIAGNOSIS — Z79899 Other long term (current) drug therapy: Secondary | ICD-10-CM | POA: Diagnosis not present

## 2015-08-07 DIAGNOSIS — I10 Essential (primary) hypertension: Secondary | ICD-10-CM | POA: Insufficient documentation

## 2015-08-07 DIAGNOSIS — J45909 Unspecified asthma, uncomplicated: Secondary | ICD-10-CM | POA: Insufficient documentation

## 2015-08-07 DIAGNOSIS — R0600 Dyspnea, unspecified: Secondary | ICD-10-CM | POA: Insufficient documentation

## 2015-08-07 DIAGNOSIS — Z87891 Personal history of nicotine dependence: Secondary | ICD-10-CM | POA: Diagnosis not present

## 2015-08-07 DIAGNOSIS — F419 Anxiety disorder, unspecified: Secondary | ICD-10-CM | POA: Diagnosis present

## 2015-08-07 NOTE — ED Provider Notes (Signed)
CSN: [id number]     Arrival date & time 08/07/15  2213 History   First MD Initiated Contact with Patient 08/07/15 2325     Chief Complaint  Patient presents with  . Anxiety     (Consider location/radiation/quality/duration/timing/severity/associated sxs/prior Treatment) HPI Comments: This 45 year old male who has a history of cerebral palsy and paraplegia also has a history of asthma, states that when he was lying down tonight became acutely short of breath and anxious.  He was seen a week ago and was told to use his inhaler 4 times a day, which he states he's been doing for the past week.  He did not taken.  Additional inhalation treatment prior to arrival, but called EMS for transport.  Denies any fever, productive cough, nausea, vomiting  Patient is a 45 y.o. male presenting with anxiety and shortness of breath. The history is provided by the patient.  Anxiety This is a recurrent problem. The current episode started today. The problem occurs intermittently. The problem has been unchanged. Pertinent negatives include no chills, fever, nausea or vomiting. Nothing aggravates the symptoms. He has tried nothing for the symptoms.  Shortness of Breath Severity:  Mild Onset quality:  Unable to specify Timing:  Intermittent Progression:  Worsening Chronicity:  Recurrent Context comment:  Laying down Relieved by:  Nothing Ineffective treatments:  None tried Associated symptoms: wheezing   Associated symptoms: no fever and no vomiting     Past Medical History  Diagnosis Date  . Motility disorder, esophageal   . Esophageal stricture   . Hypertension   . Pneumonia   . GERD (gastroesophageal reflux disease)   . CP (cerebral palsy) ([place])   . S/P Botox injection     approx every 4 months  . Seasonal allergies   . Environmental allergies     takes inhalers if needed  . 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 Cerebral palsy ([place])   . Diabetes mellitus without complication ([place])    borderline   Past Surgical History  Procedure Laterality Date  . Tendon release    . Mouth surgery    . Esophagus surgery      stretched esophagus  . Legs    . Esophagogastroduodenoscopy N/A 12/21/2012    Procedure: ESOPHAGOGASTRODUODENOSCOPY (EGD);  Surgeon: Inda Castle, MD;  Location: [facility];  Service: Endoscopy;  Laterality: N/A;  . Botox injection N/A 12/21/2012    Procedure: BOTOX INJECTION;  Surgeon: Inda Castle, MD;  Location: [facility];  Service: Endoscopy;  Laterality: N/A;  . Esophagogastroduodenoscopy N/A 06/28/2014    Procedure: ESOPHAGOGASTRODUODENOSCOPY (EGD);  Surgeon: Inda Castle, MD;  Location: [facility];  Service: Endoscopy;  Laterality: N/A;  . Botox injection N/A 06/28/2014    Procedure: BOTOX INJECTION;  Surgeon: Inda Castle, MD;  Location: [facility];  Service: Endoscopy;  Laterality: N/A;   Family History  Problem Relation Age of Onset  . Hypertension Father   . Lung cancer Father   . Diabetes Mother    Social History  Substance Use Topics  . Smoking status: Former Research scientist (life sciences)  . Smokeless tobacco: Former Systems developer     Comment: smoked one time  . Alcohol Use: 0.0 oz/week     Comment: rare    Review of Systems  Constitutional: Negative for fever and chills.  Respiratory: Positive for shortness of breath and wheezing.   Gastrointestinal: Negative for nausea, vomiting, diarrhea and constipation.  Psychiatric/Behavioral: The patient is nervous/anxious.   All other systems reviewed and are negative.  Allergies  Metronidazole and Penicillins  Home Medications   Prior to Admission medications   Medication Sig Start Date End Date Taking? Authorizing Provider  amLODipine (NORVASC) 5 MG tablet Take 5 mg by mouth every evening.   Yes Historical Provider, MD  busPIRone (BUSPAR) 7.5 MG tablet Take 7.5 mg by mouth 3 (three) times daily.   Yes Historical Provider, MD  cetirizine (ZYRTEC) 10 MG tablet Take 10 mg by mouth daily.  03/01/14   Yes Historical Provider, MD  citalopram (CELEXA) 20 MG tablet Take 20 mg by mouth every evening.   Yes Historical Provider, MD  CRESTOR 5 MG tablet Take 5 mg by mouth at bedtime. 04/22/14  Yes Historical Provider, MD  fluticasone (FLONASE) 50 MCG/ACT nasal spray Place 1 spray into both nostrils daily.  03/01/14  Yes Historical Provider, MD  montelukast (SINGULAIR) 10 MG tablet Take 10 mg by mouth daily.   Yes Historical Provider, MD  omeprazole (PRILOSEC) 20 MG capsule Take 20 mg by mouth daily.   Yes Historical Provider, MD  PROAIR HFA 108 (90 BASE) MCG/ACT inhaler Inhale 2 puffs into the lungs every 6 (six) hours as needed for wheezing or shortness of breath.  12/29/12  Yes Historical Provider, MD  ranitidine (ZANTAC) 150 MG capsule Take 150 mg by mouth 2 (two) times daily as needed for heartburn.   Yes Historical Provider, MD   BP 122/67 mmHg  Pulse 76  Temp(Src) 97.8 F (36.6 C) (Oral)  Resp 22  SpO2 97% Physical Exam  Constitutional: He is oriented to person, place, and time. He appears well-developed and well-nourished.  HENT:  Head: Normocephalic.  Eyes: Pupils are equal, round, and reactive to light.  Neck: Normal range of motion.  Cardiovascular: Normal rate and regular rhythm.   Pulmonary/Chest: Effort normal and breath sounds normal.  Abdominal: Soft.  Neurological: He is alert and oriented to person, place, and time.  Skin: Skin is warm.  Nursing note and vitals reviewed.   ED Course  Procedures (including critical care time) Labs Review Labs Reviewed - No data to display  Imaging Review Dg Chest 2 View  08/08/2015  CLINICAL DATA:  Acute onset of shortness of breath and left arm pain. Initial encounter. EXAM: CHEST  2 VIEW COMPARISON:  Chest radiograph performed 07/27/2015 FINDINGS: There is elevation of the right hemidiaphragm. The lungs are hypoexpanded. Mild vascular crowding is noted. There is no evidence of focal opacification, pleural effusion or pneumothorax. The  heart is normal in size; the mediastinal contour is within normal limits. No acute osseous abnormalities are seen. IMPRESSION: Lungs hypoexpanded, with elevation of the right hemidiaphragm. No focal airspace consolidation seen. Electronically Signed   By: Garald Balding M.D.   On: 08/08/2015 00:08   I have personally reviewed and evaluated these images and lab results as part of my medical decision-making.   EKG Interpretation None     Chest x-ray is negative.  I went to reevaluate patient.  He is sleeping.  Family normal vital signs.  Oxygen saturation 98% MDM   Final diagnoses:  Dyspnea  Anxiety         Junius Creamer, NP 08/08/15 0122  Varney Biles, MD 08/08/15 2304

## 2015-08-07 NOTE — ED Notes (Signed)
Pt BIB from home c/o anxiety that started about an hour ago; pt reporting trouble breathing; SpO2 100%, lung sounds clear per EMS; pt has hx of asthma; pt states this is how he usually feels when he is very anxious; pt also c/o left arm pain since this AM

## 2015-08-07 NOTE — ED Notes (Addendum)
NP at bedside.

## 2015-08-07 NOTE — ED Notes (Signed)
Bed: WA16 Expected date:  Expected time:  Means of arrival:  Comments: EMS 

## 2015-08-07 NOTE — ED Notes (Signed)
Pt returned from [facility].

## 2015-08-08 NOTE — ED Notes (Signed)
[facility] contacted for transport 

## 2015-08-08 NOTE — Discharge Instructions (Signed)
Generalized Anxiety Disorder Generalized anxiety disorder (GAD) is a mental disorder. It interferes with life functions, including relationships, work, and school. GAD is different from normal anxiety, which everyone experiences at some point in their lives in response to specific life events and activities. Normal anxiety actually helps [place] prepare for and get through these life events and activities. Normal anxiety goes away after the event or activity is over.  GAD causes anxiety that is not necessarily related to specific events or activities. It also causes excess anxiety in proportion to specific events or activities. The anxiety associated with GAD is also difficult to control. GAD can vary from mild to severe. People with severe GAD can have intense waves of anxiety with physical symptoms (panic attacks).  SYMPTOMS The anxiety and worry associated with GAD are difficult to control. This anxiety and worry are related to many life events and activities and also occur more days than not for 6 months or longer. People with GAD also have three or more of the following symptoms (one or more in children):  Restlessness.   Fatigue.  Difficulty concentrating.   Irritability.  Muscle tension.  Difficulty sleeping or unsatisfying sleep. DIAGNOSIS GAD is diagnosed through an assessment by your health care provider. Your health care provider will ask you questions aboutyour mood,physical symptoms, and events in your life. Your health care provider may ask you about your medical history and use of alcohol or drugs, including prescription medicines. Your health care provider may also do a physical exam and blood tests. Certain medical conditions and the use of certain substances can cause symptoms similar to those associated with GAD. Your health care provider may refer you to a mental health specialist for further evaluation. TREATMENT The following therapies are usually used to treat GAD:    Medication. Antidepressant medication usually is prescribed for long-term daily control. Antianxiety medicines may be added in severe cases, especially when panic attacks occur.   Talk therapy (psychotherapy). Certain types of talk therapy can be helpful in treating GAD by providing support, education, and guidance. A form of talk therapy called cognitive behavioral therapy can teach you healthy ways to think about and react to daily life events and activities.  Stress managementtechniques. These include yoga, meditation, and exercise and can be very helpful when they are practiced regularly. A mental health specialist can help determine which treatment is best for you. Some people see improvement with one therapy. However, other people require a combination of therapies.   This information is not intended to replace advice given to you by your health care provider. Make sure you discuss any questions you have with your health care provider.   Document Released: 06/27/2012 Document Revised: 03/23/2014 Document Reviewed: 06/27/2012 Elsevier Interactive Patient Education 2016 [place] your chest x-ray is normal.  Please continue using your albuterol inhaler 4 times a day.  If you wake during the night or feel short of breath in between it safe to use an additional 2 puffs if you develop fever or the inhaler does not help your shortness of breath.  Return for further evaluation

## 2015-08-10 ENCOUNTER — Encounter (HOSPITAL_COMMUNITY): Payer: Self-pay | Admitting: Emergency Medicine

## 2015-08-10 ENCOUNTER — Emergency Department (HOSPITAL_COMMUNITY): Payer: Medicare Other

## 2015-08-10 ENCOUNTER — Emergency Department (HOSPITAL_COMMUNITY)
Admission: EM | Admit: 2015-08-10 | Discharge: 2015-08-11 | Disposition: A | Discharge status: Home / Self Care | Payer: Medicare Other | Attending: Emergency Medicine | Admitting: Emergency Medicine

## 2015-08-10 DIAGNOSIS — E119 Type 2 diabetes mellitus without complications: Secondary | ICD-10-CM | POA: Diagnosis not present

## 2015-08-10 DIAGNOSIS — Z87891 Personal history of nicotine dependence: Secondary | ICD-10-CM | POA: Insufficient documentation

## 2015-08-10 DIAGNOSIS — G809 Cerebral palsy, unspecified: Secondary | ICD-10-CM | POA: Insufficient documentation

## 2015-08-10 DIAGNOSIS — K2100 Gastro-esophageal reflux disease with esophagitis, without bleeding: Secondary | ICD-10-CM

## 2015-08-10 DIAGNOSIS — I10 Essential (primary) hypertension: Secondary | ICD-10-CM | POA: Insufficient documentation

## 2015-08-10 DIAGNOSIS — F419 Anxiety disorder, unspecified: Secondary | ICD-10-CM | POA: Diagnosis present

## 2015-08-10 LAB — I-STAT CHEM 8, ED
BUN: 17 mg/dL (ref 6–20)
CALCIUM ION: 1.19 mmol/L (ref 1.12–1.23)
Chloride: 103 mmol/L (ref 101–111)
Creatinine, Ser: 0.7 mg/dL (ref 0.61–1.24)
Glucose, Bld: 161 mg/dL — ABNORMAL HIGH (ref 65–99)
HEMATOCRIT: 39 % (ref 39.0–52.0)
HEMOGLOBIN: 13.3 g/dL (ref 13.0–17.0)
Potassium: 3.8 mmol/L (ref 3.5–5.1)
SODIUM: 141 mmol/L (ref 135–145)
TCO2: 25 mmol/L (ref 0–100)

## 2015-08-10 MED ORDER — GI COCKTAIL ~~LOC~~
30.0000 mL | Freq: Once | ORAL | Status: AC
  Administered 2015-08-11: 30 mL via ORAL
  Filled 2015-08-10: qty 30

## 2015-08-10 NOTE — ED Notes (Signed)
Pt reports that he was having episodes of shortness of breath that occurred intermittently. No distress noted at this time and reports that he started on a new alcohol program and started feeling anxious around 8:30pm tonight.

## 2015-08-10 NOTE — ED Notes (Signed)
Per EMS pt coming from home for anxiety that started at 8:30pm after drinking alcohol today and not taking his normal medications. Pt denies any SI or HI.

## 2015-08-10 NOTE — ED Notes (Signed)
Bed: [id number] Expected date:  Expected time:  Means of arrival:  Comments: shob

## 2015-08-10 NOTE — ED Provider Notes (Signed)
CSN: [id number]     Arrival date & time 08/10/15  2210 History   .By signing my name below, I, Maud Deed. Royston Sinner, attest that this documentation has been prepared under the direction and in the presence of Lannette Avellino, MD.  Electronically Signed: Maud Deed. Royston Sinner, ED Scribe. 08/10/2015. 11:39 PM.   Chief Complaint  Patient presents with  . Anxiety   Patient is a 45 y.o. male presenting with anxiety. The history is provided by the patient. No language interpreter was used.  Anxiety This is a recurrent problem. The current episode started 3 to 5 hours ago. The problem occurs constantly. The problem has not changed since onset.Nothing aggravates the symptoms. Nothing relieves the symptoms. He has tried nothing for the symptoms. The treatment provided no relief.      HPI Comments: JAIQUAN SITES is a 45 y.o. male with a PMHx of HTN, quadriplegic spinal paralysis, cerebral palsy, and DM who presents to the Emergency Department complaining of possible anxiety this evening. Pt reports intermittent episodes of shortness of breath, which has been ongoing for several weeks. However, per triage note, episode this evening started at approximately 8:30pm.  It is always in the evenings lying flat.  Pt recently started a new alcohol program. Mr. Capel has been seen in the ED for anxiety within the last week.   PCP: Chesley Noon, MD    Past Medical History  Diagnosis Date  . Motility disorder, esophageal   . Esophageal stricture   . Hypertension   . Pneumonia   . GERD (gastroesophageal reflux disease)   . CP (cerebral palsy) ([place])   . S/P Botox injection     approx every 4 months  . Seasonal allergies   . Environmental allergies     takes inhalers if needed  . 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[place])   . Cerebral palsy ([place])   . Diabetes mellitus without complication ([place])     borderline   Past Surgical History  Procedure Laterality Date  . Tendon release    . Mouth surgery    . Esophagus  surgery      stretched esophagus  . Legs    . Esophagogastroduodenoscopy N/A 12/21/2012    Procedure: ESOPHAGOGASTRODUODENOSCOPY (EGD);  Surgeon: Inda Castle, MD;  Location: [facility];  Service: Endoscopy;  Laterality: N/A;  . Botox injection N/A 12/21/2012    Procedure: BOTOX INJECTION;  Surgeon: Inda Castle, MD;  Location: [facility];  Service: Endoscopy;  Laterality: N/A;  . Esophagogastroduodenoscopy N/A 06/28/2014    Procedure: ESOPHAGOGASTRODUODENOSCOPY (EGD);  Surgeon: Inda Castle, MD;  Location: [facility];  Service: Endoscopy;  Laterality: N/A;  . Botox injection N/A 06/28/2014    Procedure: BOTOX INJECTION;  Surgeon: Inda Castle, MD;  Location: [facility];  Service: Endoscopy;  Laterality: N/A;   Family History  Problem Relation Age of Onset  . Hypertension Father   . Lung cancer Father   . Diabetes Mother    Social History  Substance Use Topics  . Smoking status: Former Research scientist (life sciences)  . Smokeless tobacco: Former Systems developer     Comment: smoked one time  . Alcohol Use: 0.0 oz/week     Comment: 2 beers every other Friday and Saturday    Review of Systems  Unable to perform ROS: Other      Allergies  Metronidazole and Penicillins  Home Medications   Prior to Admission medications   Medication Sig Start Date End Date Taking? Authorizing Provider  amLODipine (NORVASC) 5 MG  tablet Take 5 mg by mouth every evening.   Yes Historical Provider, MD  busPIRone (BUSPAR) 7.5 MG tablet Take 7.5 mg by mouth 3 (three) times daily.   Yes Historical Provider, MD  cetirizine (ZYRTEC) 10 MG tablet Take 10 mg by mouth daily.  03/01/14  Yes Historical Provider, MD  citalopram (CELEXA) 20 MG tablet Take 20 mg by mouth every evening.   Yes Historical Provider, MD  CRESTOR 5 MG tablet Take 5 mg by mouth at bedtime. 04/22/14  Yes Historical Provider, MD  fluticasone (FLONASE) 50 MCG/ACT nasal spray Place 1 spray into both nostrils daily.  03/01/14  Yes Historical Provider, MD   montelukast (SINGULAIR) 10 MG tablet Take 10 mg by mouth daily.   Yes Historical Provider, MD  omeprazole (PRILOSEC) 20 MG capsule Take 20 mg by mouth daily.   Yes Historical Provider, MD  PROAIR HFA 108 (90 BASE) MCG/ACT inhaler Inhale 2 puffs into the lungs every 6 (six) hours as needed for wheezing or shortness of breath.  12/29/12  Yes Historical Provider, MD  ranitidine (ZANTAC) 150 MG capsule Take 150 mg by mouth 2 (two) times daily as needed for heartburn.   Yes Historical Provider, MD   Triage Vitals: BP 131/86 mmHg  Pulse 96  Temp(Src) 98.7 F (37.1 C) (Oral)  Resp 18  Wt 155 lb (70.308 kg)  SpO2 97%   Physical Exam  Constitutional: He is oriented to person, place, and time. He appears well-developed and well-nourished.  HENT:  Head: Normocephalic and atraumatic.  Mouth/Throat: Oropharynx is clear and moist.  Eyes: EOM are normal. Pupils are equal, round, and reactive to light.  Neck: Normal range of motion.  No Bruits  Cardiovascular: Normal rate, regular rhythm, normal heart sounds and intact distal pulses.   Pulmonary/Chest: Effort normal and breath sounds normal. No stridor. No respiratory distress. He has no wheezes. He has no rales.  Abdominal: Soft. He exhibits no distension. There is no tenderness. There is no rebound and no guarding.  Constipation. Hyperactive bowel sounds into the thoracic cavity.  Musculoskeletal: Normal range of motion.  Neurological: He is alert and oriented to person, place, and time. He has normal reflexes.  Skin: Skin is warm and dry.  Psychiatric: He has a normal mood and affect. Judgment normal.  Nursing note and vitals reviewed.   ED Course  Procedures (including critical care time)  DIAGNOSTIC STUDIES: Oxygen Saturation is 97% on RA, Normal by my interpretation.    COORDINATION OF CARE: 11:28 PM- Will order imaging, blood work, and EKG. Discussed treatment plan with pt at bedside and pt agreed to plan.     Labs Review Labs  Reviewed - No data to display  Imaging Review No results found. I have personally reviewed and evaluated these images and lab results as part of my medical decision-making.   EKG Interpretation None      MDM   Final diagnoses:  None    Date: 08/11/2015  Rate: 91  Rhythm: normal sinus rhythm  QRS Axis: normal  Intervals: normal  ST/T Wave abnormalities: normal  Conduction Disutrbances: none  Narrative Interpretation: unremarkable  Filed Vitals:   08/10/15 2217 08/11/15 0042  BP: 131/86 134/84  Pulse: 96 84  Temp: 98.7 F (37.1 C) 97.9 F (36.6 C)  Resp: 18 23    Results for orders placed or performed during the hospital encounter of 08/10/15  CBC with Differential/Platelet  Result Value Ref Range   WBC 8.1 4.0 - 10.5 K/uL  RBC 4.75 4.22 - 5.81 MIL/uL   Hemoglobin 13.2 13.0 - 17.0 g/dL   HCT 38.9 (L) 39.0 - 52.0 %   MCV 81.9 78.0 - 100.0 fL   MCH 27.8 26.0 - 34.0 pg   MCHC 33.9 30.0 - 36.0 g/dL   RDW 13.6 11.5 - 15.5 %   Platelets 206 150 - 400 K/uL   Neutrophils Relative % 53 %   Neutro Abs 4.3 1.7 - 7.7 K/uL   Lymphocytes Relative 36 %   Lymphs Abs 2.9 0.7 - 4.0 K/uL   Monocytes Relative 9 %   Monocytes Absolute 0.7 0.1 - 1.0 K/uL   Eosinophils Relative 2 %   Eosinophils Absolute 0.2 0.0 - 0.7 K/uL   Basophils Relative 0 %   Basophils Absolute 0.0 0.0 - 0.1 K/uL  I-stat chem 8, ed  Result Value Ref Range   Sodium 141 135 - 145 mmol/L   Potassium 3.8 3.5 - 5.1 mmol/L   Chloride 103 101 - 111 mmol/L   BUN 17 6 - 20 mg/dL   Creatinine, Ser 0.70 0.61 - 1.24 mg/dL   Glucose, Bld 161 (H) 65 - 99 mg/dL   Calcium, Ion 1.19 1.12 - 1.23 mmol/L   TCO2 25 0 - 100 mmol/L   Hemoglobin 13.3 13.0 - 17.0 g/dL   HCT 39.0 39.0 - 52.0 %  I-stat troponin, ED  Result Value Ref Range   Troponin i, poc 0.00 0.00 - 0.08 ng/mL   Comment 3           Dg Chest 2 View  08/08/2015  CLINICAL DATA:  Acute onset of shortness of breath and left arm pain. Initial encounter.  EXAM: CHEST  2 VIEW COMPARISON:  Chest radiograph performed 07/27/2015 FINDINGS: There is elevation of the right hemidiaphragm. The lungs are hypoexpanded. Mild vascular crowding is noted. There is no evidence of focal opacification, pleural effusion or pneumothorax. The heart is normal in size; the mediastinal contour is within normal limits. No acute osseous abnormalities are seen. IMPRESSION: Lungs hypoexpanded, with elevation of the right hemidiaphragm. No focal airspace consolidation seen. Electronically Signed   By: Garald Balding M.D.   On: 08/08/2015 00:08   Dg Chest 2 View  07/17/2015  CLINICAL DATA:  Right-sided chest pain, onset 2 hours ago. EXAM: CHEST  2 VIEW COMPARISON:  07/07/2015 FINDINGS: The lungs are clear. The pulmonary vasculature is normal. Heart size is normal. Hilar and mediastinal contours are unremarkable. There is no pleural effusion. IMPRESSION: No active cardiopulmonary disease. Electronically Signed   By: Andreas Newport M.D.   On: 07/17/2015 23:40   Ct Angio Chest Pe W/cm &/or Wo Cm  08/11/2015  CLINICAL DATA:  Acute onset of anxiety. Difficulty breathing. Initial encounter. EXAM: CT ANGIOGRAPHY CHEST WITH CONTRAST TECHNIQUE: Multidetector CT imaging of the chest was performed using the standard protocol during bolus administration of intravenous contrast. Multiplanar CT image reconstructions and MIPs were obtained to evaluate the vascular anatomy. CONTRAST:  100 mL of Isovue 370 IV contrast COMPARISON:  Chest radiograph performed 08/07/2015 FINDINGS: There is no evidence of pulmonary embolus. Scattered peripheral blebs are noted within both lungs. Minimal bilateral atelectasis is noted. There is no evidence of pleural effusion or pneumothorax. No masses are identified; no abnormal focal contrast enhancement is seen. A moderate to large hiatal hernia is noted. There appears to be focal wall thickening along the distal esophagus. Endoscopy could be considered for further  evaluation, as deemed clinically appropriate. No axillary lymphadenopathy is seen.  The visualized portions of the thyroid gland are unremarkable in appearance. The visualized portions of the liver and spleen are unremarkable. No acute osseous abnormalities are seen. Review of the MIP images confirms the above findings. IMPRESSION: 1. No evidence of pulmonary embolus. 2. Status peripheral blebs noted within both lungs. Minimal bilateral atelectasis noted. 3. Moderate to large hiatal hernia seen. 4. Apparent focal wall thickening along the distal esophagus. Endoscopy could be considered for further evaluation, as deemed clinically appropriate. Electronically Signed   By: Garald Balding M.D.   On: 08/11/2015 00:50   Dg Chest Port 1 View  07/27/2015  CLINICAL DATA:  Short of breath EXAM: PORTABLE CHEST 1 VIEW COMPARISON:  07/17/2015 FINDINGS: Hypoventilation with decreased lung volume. Bibasilar atelectasis has progressed in the interval. Negative for heart failure or effusion. Retrocardiac density may represent hiatal hernia. IMPRESSION: Hypoventilation with bibasilar atelectasis. Electronically Signed   By: Franchot Gallo M.D.   On: 07/27/2015 23:00   Medications  gi cocktail (Maalox,Lidocaine,Donnatal) (30 mLs Oral Given 08/11/15 0042)  iopamidol (ISOVUE-370) 76 % injection 100 mL (100 mLs Intravenous Contrast Given 08/11/15 0017)    Exam and history are consistent with GERD and overlying anxiety.  Will treat for GERD with medication and gerd friendly diet and due to thickening of the esophagus will have follow up with GI.  Discuss your anxiety with your PMD.   Strict return precautions.     HEART score 1, ruled out for MI in the ED stable for discharge I personally performed the services described in this documentation, which was scribed in my presence. The recorded information has been reviewed and is accurate.     Veatrice Kells, MD 08/11/15 [phone]

## 2015-08-10 NOTE — ED Notes (Signed)
MD at bedside. 

## 2015-08-11 ENCOUNTER — Encounter (HOSPITAL_COMMUNITY): Payer: Self-pay | Admitting: Emergency Medicine

## 2015-08-11 DIAGNOSIS — K210 Gastro-esophageal reflux disease with esophagitis: Secondary | ICD-10-CM | POA: Diagnosis not present

## 2015-08-11 LAB — CBC WITH DIFFERENTIAL/PLATELET
BASOS ABS: 0 10*3/uL (ref 0.0–0.1)
BASOS PCT: 0 %
EOS ABS: 0.2 10*3/uL (ref 0.0–0.7)
EOS PCT: 2 %
HCT: 38.9 % — ABNORMAL LOW (ref 39.0–52.0)
Hemoglobin: 13.2 g/dL (ref 13.0–17.0)
Lymphocytes Relative: 36 %
Lymphs Abs: 2.9 10*3/uL (ref 0.7–4.0)
MCH: 27.8 pg (ref 26.0–34.0)
MCHC: 33.9 g/dL (ref 30.0–36.0)
MCV: 81.9 fL (ref 78.0–100.0)
MONO ABS: 0.7 10*3/uL (ref 0.1–1.0)
Monocytes Relative: 9 %
NEUTROS ABS: 4.3 10*3/uL (ref 1.7–7.7)
Neutrophils Relative %: 53 %
PLATELETS: 206 10*3/uL (ref 150–400)
RBC: 4.75 MIL/uL (ref 4.22–5.81)
RDW: 13.6 % (ref 11.5–15.5)
WBC: 8.1 10*3/uL (ref 4.0–10.5)

## 2015-08-11 LAB — I-STAT TROPONIN, ED: TROPONIN I, POC: 0 ng/mL (ref 0.00–0.08)

## 2015-08-11 MED ORDER — IOPAMIDOL (ISOVUE-370) INJECTION 76%
100.0000 mL | Freq: Once | INTRAVENOUS | Status: AC | PRN
Start: 2015-08-11 — End: 2015-08-11
  Administered 2015-08-11: 100 mL via INTRAVENOUS

## 2015-08-11 MED ORDER — SUCRALFATE 1 GM/10ML PO SUSP: 1.0000 g | Freq: Three times a day (TID) | ORAL | Status: DC

## 2015-08-11 MED ORDER — OMEPRAZOLE 20 MG PO CPDR: 20.0000 mg | DELAYED_RELEASE_CAPSULE | Freq: Every day | ORAL | Status: DC

## 2015-08-11 NOTE — ED Notes (Signed)
Awaiting transportation back to residence.

## 2015-08-11 NOTE — ED Notes (Signed)
Pt reports understanding of discharge information. No questions at time of discharge 

## 2015-08-11 NOTE — ED Notes (Signed)
[facility] here for transport of pt back to residence.  

## 2015-08-11 NOTE — Discharge Instructions (Signed)
Esophagitis °Esophagitis is inflammation of the esophagus. The esophagus is the tube that carries food and liquids from your mouth to your stomach. Esophagitis can cause soreness or pain in the esophagus. This condition can make it difficult and painful to swallow.  °CAUSES °Most causes of esophagitis are not serious. Common causes of this condition include: °· Gastroesophageal reflux disease (GERD). This is when stomach contents move back up into the esophagus (reflux). °· Repeated vomiting. °· An allergic-type reaction, especially caused by food allergies (eosinophilic esophagitis). °· Injury to the esophagus by swallowing large pills with or without water, or swallowing certain types of medicines. °· Swallowing (ingesting) harmful chemicals, such as household cleaning products. °· Heavy alcohol use. °· An infection of the esophagus. This most often occurs in people who have a weakened immune system. °· Radiation or chemotherapy treatment for cancer. °· Certain diseases such as sarcoidosis, Crohn disease, and scleroderma. °SYMPTOMS °Symptoms of this condition include: °· Difficult or painful swallowing. °· Pain with swallowing acidic liquids, such as citrus juices. °· Pain with burping. °· Chest pain. °· Difficulty breathing. °· Nausea. °· Vomiting. °· Pain in the abdomen. °· Weight loss. °· Ulcers in the mouth. °· Patches of white material in the mouth (candidiasis). °· Fever. °· Coughing up blood or vomiting blood. °· Stool that is black, tarry, or bright red. °DIAGNOSIS °Your health care provider will take a medical history and perform a physical exam. You may also have other tests, including: °· An endoscopy to examine your stomach and esophagus with a small camera. °· A test that measures the acidity level in your esophagus. °· A test that measures how much pressure is on your esophagus. °· A barium swallow or modified barium swallow to show the shape, size, and functioning of your esophagus. °· Allergy  tests. °TREATMENT °Treatment for this condition depends on the cause of your esophagitis. In some cases, steroids or other medicines may be given to help relieve your symptoms or to treat the underlying cause of your condition. You may have to make some lifestyle changes, such as: °· Avoiding alcohol. °· Quitting smoking. °· Changing your diet. °· Exercising. °· Changing your sleep habits and your sleep environment. °HOME CARE INSTRUCTIONS °Take these actions to decrease your discomfort and to help avoid complications. °Diet °· Follow a diet as recommended by your health care provider. This may involve avoiding foods and drinks such as: °¨ Coffee and tea (with or without caffeine). °¨ Drinks that contain alcohol. °¨ Energy drinks and sports drinks. °¨ Carbonated drinks or sodas. °¨ Chocolate and cocoa. °¨ Peppermint and mint flavorings. °¨ Garlic and onions. °¨ Horseradish. °¨ Spicy and acidic foods, including peppers, chili powder, curry powder, vinegar, hot sauces, and barbecue sauce. °¨ Citrus fruit juices and citrus fruits, such as oranges, lemons, and limes. °¨ Tomato-based foods, such as red sauce, chili, salsa, and pizza with red sauce. °¨ Fried and fatty foods, such as donuts, french fries, potato chips, and high-fat dressings. °¨ High-fat meats, such as hot dogs and fatty cuts of red and white meats, such as rib eye steak, sausage, ham, and bacon. °¨ High-fat dairy items, such as whole milk, butter, and cream cheese. °· Eat small, frequent meals instead of large meals. °· Avoid drinking large amounts of liquid with your meals. °· Avoid eating meals during the 2-3 hours before bedtime. °· Avoid lying down right after you eat. °· Do not exercise right after you eat. °· Avoid foods and drinks that seem to   make your symptoms worse. °General Instructions °· Pay attention to any changes in your symptoms. °· Take over-the-counter and prescription medicines only as told by your health care provider. Do not take  aspirin, ibuprofen, or other NSAIDs unless your health care provider told you to do so. °· If you have trouble taking pills, use a pill splitter to decrease the size of the pill. This will decrease the chance of the pill getting stuck or injuring your esophagus on the way down. Also, drink water after you take a pill. °· Do not use any tobacco products, including cigarettes, chewing tobacco, and e-cigarettes. If you need help quitting, ask your health care provider. °· Wear loose-fitting clothing. Do not wear anything tight around your waist that causes pressure on your abdomen. °· Raise (elevate) the head of your bed about 6 inches (15 cm). °· Try to reduce your stress, such as with yoga or meditation. If you need help reducing stress, ask your health care provider. °· If you are overweight, reduce your weight to an amount that is healthy for you. Ask your health care provider for guidance about a safe weight loss goal. °· Keep all follow-up visits as told by your health care provider. This is important. °SEEK MEDICAL CARE IF: °· You have new symptoms. °· You have unexplained weight loss. °· You have difficulty swallowing, or it hurts to swallow. °· You have wheezing or a persistent cough. °· Your symptoms do not improve with treatment. °· You have frequent heartburn for more than two weeks. °SEEK IMMEDIATE MEDICAL CARE IF: °· You have severe pain in your arms, neck, jaw, teeth, or back. °· You feel sweaty, dizzy, or light-headed. °· You have chest pain or shortness of breath. °· You vomit and your vomit looks like blood or coffee grounds. °· Your stool is bloody or black. °· You have a fever. °· You cannot swallow, drink, or eat. °  °This information is not intended to replace advice given to you by your health care provider. Make sure you discuss any questions you have with your health care provider. °  °Document Released: 04/09/2004 Document Revised: 11/21/2014 Document Reviewed: 06/27/2014 °Elsevier Interactive  Patient Education ©2016 [employer] ° °

## 2015-08-11 NOTE — ED Notes (Signed)
Awaiting transport of pt to residence.

## 2015-08-11 NOTE — ED Notes (Signed)
Awaiting transportation of pt back to residence via [place].

## 2015-08-11 NOTE — ED Notes (Signed)
[place]  Notified for need of transport of pt back to residence.

## 2015-08-23 ENCOUNTER — Encounter: Payer: Self-pay | Admitting: Physical Therapy

## 2015-08-23 ENCOUNTER — Ambulatory Visit: Payer: Medicare Other | Attending: Physician Assistant | Admitting: Physical Therapy

## 2015-08-23 DIAGNOSIS — M6281 Muscle weakness (generalized): Secondary | ICD-10-CM | POA: Insufficient documentation

## 2015-08-23 DIAGNOSIS — G809 Cerebral palsy, unspecified: Secondary | ICD-10-CM | POA: Insufficient documentation

## 2015-08-23 NOTE — Patient Instructions (Signed)
Sretches for lower leg: can be performed seated in power chair or lying on his back in bed (this may be easier for the hip stretches). All stretches should be gentle and not cause pain.  1. Gently hold behind lower leg just above the ankle. Gently pull the ankle toward you to create a gentle stretch in the hamstring. Yehya is good at telling you when to stop/if a pull if felt or if it hurts. DO NOT GO THROUGH PAIN, DECREASE PULL IF STARTING TO HURT. Hold each stretch for 30 seconds, 3 times with each leg.  2. With knees bent and feet support on leg rest (or bed if performing this in bed while lying down). Gently push knee out (away from other knee) until Zayyan reports he feels a gently pull on the inside of his thigh. Hold for 30 seconds. Repeat for 3 times each leg.  3. With knees bent and feet support as above with number 2- gently push both knees together until Haney reports feeling a pull/stretch on outer hip/thigh area. Hold for 30 seconds. Repeat for 3 reps.

## 2015-08-25 NOTE — Therapy (Signed)
[facility] [address] [place], [place], [address] Phone: [phone]   Fax:  [phone]  Physical Therapy Treatment  Patient Details  Name: Thomas Ramos MRN: [id number] Date of Birth: 1970/08/18 Referring Provider: Dr. Mattie Marlin  Encounter Date: 08/23/2015   08/23/15 1452  PT Visits / Re-Eval  Visit Number 2  Number of Visits 5  Date for PT Re-Evaluation 09/01/15  Authorization  Authorization Type Medicare  Authorization Time Period 08-01-15 - 09-30-15  PT Time Calculation  PT Start Time 1448  PT Stop Time 1528  PT Time Calculation (min) 40 min     Past Medical History  Diagnosis Date  . Motility disorder, esophageal   . Esophageal stricture   . Hypertension   . Pneumonia   . GERD (gastroesophageal reflux disease)   . CP (cerebral palsy) ([place])   . S/P Botox injection     approx every 4 months  . Seasonal allergies   . Environmental allergies     takes inhalers if needed  . 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[place])   . Cerebral palsy ([place])   . Diabetes mellitus without complication ([place])     borderline    Past Surgical History  Procedure Laterality Date  . Tendon release    . Mouth surgery    . Esophagus surgery      stretched esophagus  . Legs    . Esophagogastroduodenoscopy N/A 12/21/2012    Procedure: ESOPHAGOGASTRODUODENOSCOPY (EGD);  Surgeon: Inda Castle, MD;  Location: [facility];  Service: Endoscopy;  Laterality: N/A;  . Botox injection N/A 12/21/2012    Procedure: BOTOX INJECTION;  Surgeon: Inda Castle, MD;  Location: [facility];  Service: Endoscopy;  Laterality: N/A;  . Esophagogastroduodenoscopy N/A 06/28/2014    Procedure: ESOPHAGOGASTRODUODENOSCOPY (EGD);  Surgeon: Inda Castle, MD;  Location: [facility];  Service: Endoscopy;  Laterality: N/A;  . Botox injection N/A 06/28/2014    Procedure: BOTOX INJECTION;  Surgeon: Inda Castle, MD;  Location: [facility];  Service:  Endoscopy;  Laterality: N/A;    There were no vitals filed for this visit.     08/23/15 1450  Symptoms/Limitations  Subjective Pt presents to therapy in power wheelchair today, no caregiver with him.   Pertinent History congenital CP  Patient Stated Goals "increase motion": lose weight  Pain Assessment  Currently in Pain? Yes  Pain Score 2  Pain Location Neck  Pain Orientation Lower  Pain Descriptors / Indicators Aching;Sore  Pain Type Acute pain  Pain Onset In the past 7 days  Pain Frequency Intermittent  Aggravating Factors  in the mornings  Pain Relieving Factors movements     treatment: Passive stretching performed of bil LE's: hamstrings, hip adductors, hip abductions and heel cords. Passive stretching of right UE: elbow extension, shoulder flexion, abduction and shoulder IR/ER. UE required scapular and shoulder stabilization with stretching. Written instructions provided for LE's only.         08/23/15 1525  PT Education  Education Details HEP: LE stretching program  Person(s) Educated Patient  Methods Explanation;Demonstration;Handout;Verbal cues  Comprehension Verbalized understanding;Returned demonstration;Verbal cues required;Need further instruction           PT Long Term Goals - 08/04/15 1808    PT LONG TERM GOAL #1   Title Caregiver will demonstrate understanding/assistance with HEP for pt to increase strengthening and stretching and cardio as able to perform. 09-01-15   Time 4   Period Weeks   Status New  PT LONG TERM GOAL #2   Title Perform 10" on SCifit with LE's to demo increased endurance/activity tolerance.  09-01-15   Time 4   Period Weeks   Status New        08/23/15 1452  Plan  Clinical Impression Statement Pt shown stretches for LE"s and right UE. No caregiver present to educate on safe and correct form/technique with stretching. Written instructions provided for LE's only as the UE requires more stability/stabilization with  stretching. These will need hands on practice in session with caregiver. Pt to see if caregiver can come to next visit so this can be addressed.                                Pt will benefit from skilled therapeutic intervention in order to improve on the following deficits Decreased strength;Impaired tone;Postural dysfunction;Decreased balance;Decreased mobility;Impaired flexibility;Increased muscle spasms;Impaired UE functional use  Rehab Potential Fair  PT Frequency 1x / week  PT Duration 4 weeks  PT Treatment/Interventions ADLs/Self Care Home Management;Therapeutic activities;Therapeutic exercise;Balance training;Functional mobility training;Neuromuscular re-education;Patient/family education  PT Next Visit Plan educated caregiver in stretching for LE's and right UE;left UE strengthening HEP; trial UBE at table top.  PT Home Exercise Plan see above  Consulted and Agree with Plan of Care Patient;Family member/caregiver  Family Member Consulted Joe - caregiver      Patient will benefit from skilled therapeutic intervention in order to improve the following deficits and impairments:  Decreased strength, Impaired tone, Postural dysfunction, Decreased balance, Decreased mobility, Impaired flexibility, Increased muscle spasms, Impaired UE functional use  Visit Diagnosis: Cerebral palsy, unspecified (HCC)  Muscle weakness (generalized)     Problem List Patient Active Problem List   Diagnosis Date Noted  . Hypersomnia 07/16/2015  . Dyspnea 07/16/2015  . Asthma 07/16/2015  . GERD (gastroesophageal reflux disease) 07/16/2015  . Cerebral palsy ([place]) 07/16/2015  . Hematemesis with nausea   . Influenza with pneumonia   . Aspiration pneumonia ([place]) 05/24/2014  . HTN (hypertension) 05/24/2014  . Fever 05/24/2014  . Dysphagia 09/16/2010  . 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

## 2015-08-26 ENCOUNTER — Ambulatory Visit: Payer: Medicare Other | Admitting: Physical Therapy

## 2015-08-26 ENCOUNTER — Emergency Department (HOSPITAL_COMMUNITY): Payer: Medicare Other

## 2015-08-26 ENCOUNTER — Encounter (HOSPITAL_COMMUNITY): Payer: Self-pay | Admitting: Emergency Medicine

## 2015-08-26 ENCOUNTER — Emergency Department (HOSPITAL_COMMUNITY)
Admission: EM | Admit: 2015-08-26 | Discharge: 2015-08-26 | Disposition: A | Discharge status: Home / Self Care | Payer: Medicare Other | Attending: Emergency Medicine | Admitting: Emergency Medicine

## 2015-08-26 DIAGNOSIS — M542 Cervicalgia: Secondary | ICD-10-CM | POA: Insufficient documentation

## 2015-08-26 DIAGNOSIS — Z87891 Personal history of nicotine dependence: Secondary | ICD-10-CM | POA: Diagnosis not present

## 2015-08-26 DIAGNOSIS — E119 Type 2 diabetes mellitus without complications: Secondary | ICD-10-CM | POA: Insufficient documentation

## 2015-08-26 DIAGNOSIS — I10 Essential (primary) hypertension: Secondary | ICD-10-CM | POA: Insufficient documentation

## 2015-08-26 DIAGNOSIS — Z79899 Other long term (current) drug therapy: Secondary | ICD-10-CM | POA: Diagnosis not present

## 2015-08-26 MED ORDER — IBUPROFEN 400 MG PO TABS
ORAL_TABLET | ORAL | Status: AC
  Filled 2015-08-26: qty 1

## 2015-08-26 MED ORDER — IBUPROFEN 400 MG PO TABS
400.0000 mg | ORAL_TABLET | Freq: Once | ORAL | Status: AC | PRN
  Administered 2015-08-26: 400 mg via ORAL

## 2015-08-26 MED ORDER — ALBUTEROL SULFATE HFA 108 (90 BASE) MCG/ACT IN AERS: 1.0000 | INHALATION_SPRAY | Freq: Four times a day (QID) | RESPIRATORY_TRACT | Status: DC | PRN

## 2015-08-26 MED ORDER — OXYCODONE-ACETAMINOPHEN 5-325 MG PO TABS
1.0000 | ORAL_TABLET | Freq: Once | ORAL | Status: AC
  Administered 2015-08-26: 1 via ORAL
  Filled 2015-08-26: qty 1

## 2015-08-26 NOTE — Discharge Instructions (Signed)
You have neck pain, possibly from a cervical strain and/or pinched nerve.   SEEK IMMEDIATE MEDICAL ATTENTION IF: You develop difficulties swallowing or breathing.  You have new or worse numbness, weakness, tingling, or movement problems in your arms or legs.  You develop increasing pain which is uncontrolled with medications.

## 2015-08-26 NOTE — ED Notes (Signed)
Dr. Wickline at bedside.  

## 2015-08-26 NOTE — ED Notes (Signed)
Gave pt [place] sandwich, applesauce and a Sprite, per Dr. Christy Gentles. Informed [place] - RN.

## 2015-08-26 NOTE — Progress Notes (Addendum)
ED CM noted that patient has had 11 ED visits not resulting in any hospitalizations. Patient is a  Medicare recipient. PCP noted as Dr. Diamantina Monks. CM met with patient at bedside to discuss assistance with care coordination.  Patient confirmed information, last appointment with Dr Melford Aase was a couple of weeks ago, states, next appointment is in 3 months, offered assistance care coordination, patient declined states, he will contact office to schedule f/u ED appt. Denies any additional concerns or questions at this time. [facility] consult placed due to number of ED visits. No further CM needs identified.

## 2015-08-26 NOTE — ED Provider Notes (Signed)
CSN: [id number]     Arrival date & time 08/26/15  1152 History   First MD Initiated Contact with Patient 08/26/15 1659     Chief Complaint  Patient presents with  . Neck Pain     Patient is a 45 y.o. male presenting with neck pain. The history is provided by the patient and a friend.  Neck Pain Pain location:  L side Quality:  Aching Pain radiates to:  Does not radiate Pain severity:  Moderate Onset quality:  Gradual Duration: several days. Timing:  Constant Progression:  Worsening Chronicity:  New Context: not fall   Relieved by:  Nothing Worsened by:  Position (movement) Associated symptoms: no fever, no headaches and no weakness   Patient with h/o cerebral palsy presents with left sided neck pain for at least 2 days No falls/traum No previous h/o neck surgery No new weakness reported No new HA No difficulty swallowing No fever/vomiting Friend at bedside who knows patient well reports he is at baseline  Past Medical History  Diagnosis Date  . Motility disorder, esophageal   . Esophageal stricture   . Hypertension   . Pneumonia   . GERD (gastroesophageal reflux disease)   . CP (cerebral palsy) ([place])   . S/P Botox injection     approx every 4 months  . Seasonal allergies   . Environmental allergies     takes inhalers if needed  . 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[place])   . Cerebral palsy ([place])   . Diabetes mellitus without complication ([place])     borderline   Past Surgical History  Procedure Laterality Date  . Tendon release    . Mouth surgery    . Esophagus surgery      stretched esophagus  . Legs    . Esophagogastroduodenoscopy N/A 12/21/2012    Procedure: ESOPHAGOGASTRODUODENOSCOPY (EGD);  Surgeon: Inda Castle, MD;  Location: [facility];  Service: Endoscopy;  Laterality: N/A;  . Botox injection N/A 12/21/2012    Procedure: BOTOX INJECTION;  Surgeon: Inda Castle, MD;  [facility];  Service: Endoscopy;  Laterality: N/A;  .  Esophagogastroduodenoscopy N/A 06/28/2014    Procedure: ESOPHAGOGASTRODUODENOSCOPY (EGD);  Surgeon: Inda Castle, MD;  Location: [facility];  Service: Endoscopy;  Laterality: N/A;  . Botox injection N/A 06/28/2014    Procedure: BOTOX INJECTION;  Surgeon: Inda Castle, MD;  Location: [facility];  Service: Endoscopy;  Laterality: N/A;   Family History  Problem Relation Age of Onset  . Hypertension Father   . Lung cancer Father   . Diabetes Mother    Social History  Substance Use Topics  . Smoking status: Former Research scientist (life sciences)  . Smokeless tobacco: Former Systems developer     Comment: smoked one time  . Alcohol Use: 0.0 oz/week     Comment: 2 beers every other Friday and Saturday    Review of Systems  Constitutional: Negative for fever.  Gastrointestinal: Negative for vomiting.  Musculoskeletal: Positive for neck pain.  Neurological: Negative for weakness and headaches.  All other systems reviewed and are negative.     Allergies  Metronidazole and Penicillins  Home Medications   Prior to Admission medications   Medication Sig Start Date End Date Taking? Authorizing Provider  amLODipine (NORVASC) 5 MG tablet Take 5 mg by mouth every evening.    Historical Provider, MD  busPIRone (BUSPAR) 7.5 MG tablet Take 7.5 mg by mouth 3 (three) times daily.    Historical Provider, MD  cetirizine (ZYRTEC) 10 MG tablet Take  10 mg by mouth daily.  03/01/14   Historical Provider, MD  citalopram (CELEXA) 20 MG tablet Take 20 mg by mouth every evening.    Historical Provider, MD  CRESTOR 5 MG tablet Take 5 mg by mouth at bedtime. 04/22/14   Historical Provider, MD  fluticasone (FLONASE) 50 MCG/ACT nasal spray Place 1 spray into both nostrils daily.  03/01/14   Historical Provider, MD  montelukast (SINGULAIR) 10 MG tablet Take 10 mg by mouth daily.    Historical Provider, MD  omeprazole (PRILOSEC) 20 MG capsule Take 20 mg by mouth daily.    Historical Provider, MD  omeprazole (PRILOSEC) 20 MG capsule Take 1  capsule (20 mg total) by mouth daily. 08/11/15   April Palumbo, MD  PROAIR HFA 108 (90 BASE) MCG/ACT inhaler Inhale 2 puffs into the lungs every 6 (six) hours as needed for wheezing or shortness of breath.  12/29/12   Historical Provider, MD  ranitidine (ZANTAC) 150 MG capsule Take 150 mg by mouth 2 (two) times daily as needed for heartburn.    Historical Provider, MD  sucralfate (CARAFATE) 1 GM/10ML suspension Take 10 mLs (1 g total) by mouth 4 (four) times daily -  with meals and at bedtime. 08/11/15   April Palumbo, MD   BP 150/120 mmHg  Pulse 101  Temp(Src) 97.9 F (36.6 C) (Oral)  Resp 20  SpO2 99% Physical Exam CONSTITUTIONAL: Well developed/well nourished, sitting in wheelchair HEAD: Normocephalic/atraumatic EYES: EOMI/PERRL ENMT: Mucous membranes moist NECK: supple no meningeal signs, no bruits SPINE/BACK:mild cervical spinal tenderness and mild left paracervical tenderness, no bruising or crepitus noted CV: S1/S2 noted, no murmurs/rubs/gallops noted LUNGS: Lungs are clear to auscultation bilaterally, no apparent distress ABDOMEN: soft NEURO: Pt is awake/alert.  No obvious facial droop.  He has some limiiation in movement of upper extremities (weakness is greater distally than proximal) but this is chronic and unchanged SKIN: warm, color normal   ED Course  Procedures  6:14 PM Pt stable H/o CP now here with neck pain that is atraumatic He is in no distress He is interactive He is at baseline weakness in upper extremities, no new neuro deficits reported Will obtain CT imaging  Imaging Review Ct Cervical Spine Wo Contrast  08/26/2015  CLINICAL DATA:  Neck pain in a patient with history of cerebral palsy. EXAM: CT CERVICAL SPINE WITHOUT CONTRAST TECHNIQUE: Multidetector CT imaging of the cervical spine was performed without intravenous contrast. Multiplanar CT image reconstructions were also generated. COMPARISON:  No comparison studies available. FINDINGS: Imaging was  obtained from the skullbase through the T1-2 interspace. No evidence fracture. Loss of disc height is seen at C5-6 and C6-7 with endplate degeneration. No prevertebral soft tissue swelling. Images which include the lower brain shows marked dilatation of the lateral ventricles with overlying cortical volume loss, incompletely visualized. IMPRESSION: 1. No evidence for cervical spine fracture. 2. Marked dilatation of the lateral ventricles, incompletely visualized. This may be related to the history of cerebral palsy. If there is clinical concern for hydrocephalus, dedicated CT scan of the head is recommended. Electronically Signed   By: Misty Stanley M.D.   On: 08/26/2015 18:44    9:46 PM Ct cspine negative Pt awake/alert Still has some tenderness to palpation of left cervical paraspinous muscles He has full ROM of his neck He is in no distress He is eating  Will d/c home Advised f/u with PCP this week if no improvement He denies HA, no new neuro deficits, defer further workup  MDM   Final diagnoses:  Neck pain    Nursing notes including past medical history and social history reviewed and considered in documentation Previous records reviewed and considered - previous ED visits reviewed     Ripley Fraise, MD 08/26/15 2147

## 2015-08-26 NOTE — ED Notes (Signed)
Pt c/o neck pain that started this weekend, "thought it would go away" -- pt in motorized w/c -- hx of Cerebral Palsy-- also c/o cough

## 2015-09-03 ENCOUNTER — Ambulatory Visit: Payer: Medicare Other | Admitting: Physical Therapy

## 2015-09-12 ENCOUNTER — Ambulatory Visit: Payer: Medicare Other | Admitting: Physical Therapy

## 2015-09-12 DIAGNOSIS — G809 Cerebral palsy, unspecified: Secondary | ICD-10-CM

## 2015-09-12 DIAGNOSIS — M6281 Muscle weakness (generalized): Secondary | ICD-10-CM

## 2015-09-14 NOTE — Therapy (Signed)
[facility] [address] [place], [place], [address] Phone: [phone]   Fax:  [phone]  Physical Therapy Treatment  Patient Details  Name: Thomas Ramos MRN: [id number] Date of Birth: June 24, 1970 Referring Provider: Dr. Mattie Marlin  Encounter Date: 09/12/2015      PT End of Session - 09/14/15 1204    Visit Number 3  G3   Number of Visits 5   Date for PT Re-Evaluation 09/01/15   Authorization Type Medicare   Authorization Time Period 08-01-15 - 09-30-15   PT Start Time 1447   PT Stop Time 1531   PT Time Calculation (min) 44 min      Past Medical History  Diagnosis Date  . Motility disorder, esophageal   . Esophageal stricture   . Hypertension   . Pneumonia   . GERD (gastroesophageal reflux disease)   . CP (cerebral palsy) ([place])   . S/P Botox injection     approx every 4 months  . Seasonal allergies   . Environmental allergies     takes inhalers if needed  . 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[place])   . Cerebral palsy ([place])   . Diabetes mellitus without complication ([place])     borderline    Past Surgical History  Procedure Laterality Date  . Tendon release    . Mouth surgery    . Esophagus surgery      stretched esophagus  . Legs    . Esophagogastroduodenoscopy N/A 12/21/2012    Procedure: ESOPHAGOGASTRODUODENOSCOPY (EGD);  Surgeon: Inda Castle, MD;  Location: [facility];  Service: Endoscopy;  Laterality: N/A;  . Botox injection N/A 12/21/2012    Procedure: BOTOX INJECTION;  Surgeon: Inda Castle, MD;  [facility];  Service: Endoscopy;  Laterality: N/A;  . Esophagogastroduodenoscopy N/A 06/28/2014    Procedure: ESOPHAGOGASTRODUODENOSCOPY (EGD);  Surgeon: Inda Castle, MD;  Location: [facility];  Service: Endoscopy;  Laterality: N/A;  . Botox injection N/A 06/28/2014    Procedure: BOTOX INJECTION;  Surgeon: Inda Castle, MD;  Location: [facility];  Service: Endoscopy;   Laterality: N/A;    There were no vitals filed for this visit.      Subjective Assessment - 09/14/15 1158    Subjective Pt presents without caregiver to PT: states he missed the last 2 appts because he went to ED - requests to make up those 2 visits   Pertinent History congenital CP   Patient Stated Goals "increase motion": lose weight   Currently in Pain? No/denies                         [facility] Adult PT Treatment/Exercise - 09/14/15 0001    Exercises   Exercises Shoulder;Knee/Hip;Lumbar   Knee/Hip Exercises: Stretches   Passive Hamstring Stretch Right;Left;3 reps;20 seconds   Other Knee/Hip Stretches Hip/knee flexion for PROM 10 reps each leg - pt reclined back in power wheelchair   Shoulder Exercises: Stretch   Elbow Flexion PROM;Right;20 reps  RUE     Performed RUE PROM/stretching - shoulder flexion/extension and abduction and IR/ER - approx. 10 reps each R elbow flexion/extension PROM and stretching 10 reps each Attempted R wrist extension as wrist is at 90 degrees of flexion - unable to extend due to spasticity/tone  Pt performed LUE shoulder flexion/extension and abduction/adduction with 2# weight 10 - 15 reps each;  L shoulder Protraction with pt reclined back in wheelchair - 2# weight on LUE - 10 reps  PT Long Term Goals - 08/04/15 1808    PT LONG TERM GOAL #1   Title Caregiver will demonstrate understanding/assistance with HEP for pt to increase strengthening and stretching and cardio as able to perform. 09-01-15   Time 4   Period Weeks   Status New   PT LONG TERM GOAL #2   Title Perform 10" on SCifit with LE's to demo increased endurance/activity tolerance.  09-01-15   Time 4   Period Weeks   Status New               Plan - 09/14/15 1205    Clinical Impression Statement Pt is unable to independently perform stretches for LE's and RUE due to severity of spasticity/contractures; no caregiver accompanying pt to PT,  but pt reports he may be moving and agetting someone to assist him with exercises.  Pt has very minimal to no volitional movement of UE's/LE's - therefore,  effectiveness of PT is very minimal due to lack of carryover without someone to assist him.   Rehab Potential Fair   PT Frequency 1x / week   PT Duration 4 weeks   PT Treatment/Interventions ADLs/Self Care Home Management;Therapeutic activities;Therapeutic exercise;Balance training;Functional mobility training;Neuromuscular re-education;Patient/family education   PT Next Visit Plan PROM RUE and bil. LE's:  LUE strengthening as able;   PT Home Exercise Plan see above   Consulted and Agree with Plan of Care Patient;Family member/caregiver      Patient will benefit from skilled therapeutic intervention in order to improve the following deficits and impairments:  Decreased strength, Impaired tone, Postural dysfunction, Decreased balance, Decreased mobility, Impaired flexibility, Increased muscle spasms, Impaired UE functional use  Visit Diagnosis: Cerebral palsy, unspecified (HCC)  Muscle weakness (generalized)     Problem List Patient Active Problem List   Diagnosis Date Noted  . Hypersomnia 07/16/2015  . Dyspnea 07/16/2015  . Asthma 07/16/2015  . GERD (gastroesophageal reflux disease) 07/16/2015  . Cerebral palsy ([place]) 07/16/2015  . Hematemesis with nausea   . Influenza with pneumonia   . Aspiration pneumonia ([place]) 05/24/2014  . HTN (hypertension) 05/24/2014  . Fever 05/24/2014  . Dysphagia 09/16/2010  . 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

## 2015-09-16 ENCOUNTER — Ambulatory Visit: Payer: Medicare Other | Attending: Physician Assistant | Admitting: Physical Therapy

## 2015-09-20 ENCOUNTER — Ambulatory Visit (HOSPITAL_BASED_OUTPATIENT_CLINIC_OR_DEPARTMENT_OTHER): Payer: Medicare Other

## 2015-09-20 ENCOUNTER — Encounter (HOSPITAL_COMMUNITY): Payer: Self-pay | Admitting: Emergency Medicine

## 2015-09-20 ENCOUNTER — Emergency Department (HOSPITAL_COMMUNITY): Payer: Medicare Other

## 2015-09-20 ENCOUNTER — Inpatient Hospital Stay (HOSPITAL_COMMUNITY)
Admission: EM | Admit: 2015-09-20 | Discharge: 2015-09-23 | DRG: 194 | Disposition: A | Discharge status: Home / Self Care | Payer: Medicare Other | Attending: Internal Medicine | Admitting: Internal Medicine

## 2015-09-20 DIAGNOSIS — R0682 Tachypnea, not elsewhere classified: Secondary | ICD-10-CM | POA: Diagnosis present

## 2015-09-20 DIAGNOSIS — L03116 Cellulitis of left lower limb: Secondary | ICD-10-CM | POA: Diagnosis not present

## 2015-09-20 DIAGNOSIS — I10 Essential (primary) hypertension: Secondary | ICD-10-CM | POA: Diagnosis present

## 2015-09-20 DIAGNOSIS — R1310 Dysphagia, unspecified: Secondary | ICD-10-CM

## 2015-09-20 DIAGNOSIS — G808 Other cerebral palsy: Secondary | ICD-10-CM | POA: Diagnosis present

## 2015-09-20 DIAGNOSIS — Z8249 Family history of ischemic heart disease and other diseases of the circulatory system: Secondary | ICD-10-CM

## 2015-09-20 DIAGNOSIS — M7989 Other specified soft tissue disorders: Secondary | ICD-10-CM | POA: Diagnosis not present

## 2015-09-20 DIAGNOSIS — F418 Other specified anxiety disorders: Secondary | ICD-10-CM | POA: Diagnosis present

## 2015-09-20 DIAGNOSIS — J45901 Unspecified asthma with (acute) exacerbation: Secondary | ICD-10-CM | POA: Diagnosis present

## 2015-09-20 DIAGNOSIS — J4530 Mild persistent asthma, uncomplicated: Secondary | ICD-10-CM | POA: Diagnosis not present

## 2015-09-20 DIAGNOSIS — Z79899 Other long term (current) drug therapy: Secondary | ICD-10-CM

## 2015-09-20 DIAGNOSIS — J45909 Unspecified asthma, uncomplicated: Secondary | ICD-10-CM | POA: Diagnosis present

## 2015-09-20 DIAGNOSIS — J690 Pneumonitis due to inhalation of food and vomit: Secondary | ICD-10-CM

## 2015-09-20 DIAGNOSIS — J189 Pneumonia, unspecified organism: Principal | ICD-10-CM | POA: Diagnosis present

## 2015-09-20 DIAGNOSIS — F329 Major depressive disorder, single episode, unspecified: Secondary | ICD-10-CM | POA: Diagnosis present

## 2015-09-20 DIAGNOSIS — Z880 Allergy status to penicillin: Secondary | ICD-10-CM

## 2015-09-20 DIAGNOSIS — R0902 Hypoxemia: Secondary | ICD-10-CM | POA: Diagnosis present

## 2015-09-20 DIAGNOSIS — E785 Hyperlipidemia, unspecified: Secondary | ICD-10-CM | POA: Diagnosis present

## 2015-09-20 DIAGNOSIS — G809 Cerebral palsy, unspecified: Secondary | ICD-10-CM | POA: Diagnosis present

## 2015-09-20 DIAGNOSIS — Z888 Allergy status to other drugs, medicaments and biological substances status: Secondary | ICD-10-CM

## 2015-09-20 DIAGNOSIS — I872 Venous insufficiency (chronic) (peripheral): Secondary | ICD-10-CM | POA: Insufficient documentation

## 2015-09-20 DIAGNOSIS — Z7951 Long term (current) use of inhaled steroids: Secondary | ICD-10-CM

## 2015-09-20 DIAGNOSIS — Z833 Family history of diabetes mellitus: Secondary | ICD-10-CM

## 2015-09-20 DIAGNOSIS — R7303 Prediabetes: Secondary | ICD-10-CM | POA: Diagnosis present

## 2015-09-20 DIAGNOSIS — G800 Spastic quadriplegic cerebral palsy: Secondary | ICD-10-CM | POA: Diagnosis not present

## 2015-09-20 DIAGNOSIS — Z801 Family history of malignant neoplasm of trachea, bronchus and lung: Secondary | ICD-10-CM

## 2015-09-20 DIAGNOSIS — K219 Gastro-esophageal reflux disease without esophagitis: Secondary | ICD-10-CM | POA: Diagnosis present

## 2015-09-20 DIAGNOSIS — F419 Anxiety disorder, unspecified: Secondary | ICD-10-CM | POA: Diagnosis present

## 2015-09-20 LAB — CBC WITH DIFFERENTIAL/PLATELET
BASOS ABS: 0 10*3/uL (ref 0.0–0.1)
BASOS PCT: 0 %
EOS ABS: 0 10*3/uL (ref 0.0–0.7)
Eosinophils Relative: 0 %
HEMATOCRIT: 37.6 % — AB (ref 39.0–52.0)
HEMOGLOBIN: 13.3 g/dL (ref 13.0–17.0)
Lymphocytes Relative: 6 %
Lymphs Abs: 0.8 10*3/uL (ref 0.7–4.0)
MCH: 27.7 pg (ref 26.0–34.0)
MCHC: 35.4 g/dL (ref 30.0–36.0)
MCV: 78.2 fL (ref 78.0–100.0)
MONOS PCT: 6 %
Monocytes Absolute: 0.9 10*3/uL (ref 0.1–1.0)
NEUTROS ABS: 13.4 10*3/uL — AB (ref 1.7–7.7)
NEUTROS PCT: 88 %
PLATELETS: 179 10*3/uL (ref 150–400)
RBC: 4.81 MIL/uL (ref 4.22–5.81)
RDW: 13.1 % (ref 11.5–15.5)
WBC: 15.2 10*3/uL — AB (ref 4.0–10.5)

## 2015-09-20 LAB — BASIC METABOLIC PANEL
Anion gap: 7 (ref 5–15)
BUN: 17 mg/dL (ref 6–20)
CALCIUM: 9.3 mg/dL (ref 8.9–10.3)
CO2: 25 mmol/L (ref 22–32)
Chloride: 103 mmol/L (ref 101–111)
Creatinine, Ser: 0.56 mg/dL — ABNORMAL LOW (ref 0.61–1.24)
GFR calc Af Amer: >60 mL/min (ref >60)
GLUCOSE: 173 mg/dL — AB (ref 65–99)
Potassium: 3.8 mmol/L (ref 3.5–5.1)
Sodium: 135 mmol/L (ref 135–145)

## 2015-09-20 LAB — LACTIC ACID, PLASMA: Lactic Acid, Venous: 1 mmol/L (ref 0.5–1.9)

## 2015-09-20 MED ORDER — ENOXAPARIN SODIUM 40 MG/0.4ML ~~LOC~~ SOLN
40.0000 mg | SUBCUTANEOUS | Status: DC
  Administered 2015-09-21 – 2015-09-23 (×3): 40 mg via SUBCUTANEOUS
  Filled 2015-09-20 (×3): qty 0.4

## 2015-09-20 MED ORDER — ACETAMINOPHEN 325 MG PO TABS
650.0000 mg | ORAL_TABLET | Freq: Once | ORAL | Status: AC
  Administered 2015-09-20: 650 mg via ORAL
  Filled 2015-09-20: qty 2

## 2015-09-20 MED ORDER — CLINDAMYCIN PHOSPHATE 600 MG/50ML IV SOLN
600.0000 mg | Freq: Three times a day (TID) | INTRAVENOUS | Status: DC
  Administered 2015-09-21 – 2015-09-23 (×7): 600 mg via INTRAVENOUS
  Filled 2015-09-20 (×7): qty 50

## 2015-09-20 MED ORDER — SODIUM CHLORIDE 0.9 % IV SOLN
INTRAVENOUS | Status: AC
  Administered 2015-09-21: via INTRAVENOUS

## 2015-09-20 MED ORDER — FLUTICASONE PROPIONATE 50 MCG/ACT NA SUSP
1.0000 | Freq: Every day | NASAL | Status: DC
  Administered 2015-09-21 – 2015-09-22 (×2): 1 via NASAL
  Filled 2015-09-20: qty 16

## 2015-09-20 MED ORDER — ALBUTEROL SULFATE (2.5 MG/3ML) 0.083% IN NEBU: 2.5000 mg | INHALATION_SOLUTION | RESPIRATORY_TRACT | Status: DC | PRN

## 2015-09-20 MED ORDER — CLINDAMYCIN PHOSPHATE 600 MG/50ML IV SOLN
600.0000 mg | Freq: Once | INTRAVENOUS | Status: AC
  Administered 2015-09-20: 600 mg via INTRAVENOUS
  Filled 2015-09-20: qty 50

## 2015-09-20 MED ORDER — AMLODIPINE BESYLATE 10 MG PO TABS
10.0000 mg | ORAL_TABLET | Freq: Every day | ORAL | Status: DC
  Administered 2015-09-21 – 2015-09-23 (×3): 10 mg via ORAL
  Filled 2015-09-20 (×3): qty 1

## 2015-09-20 MED ORDER — IRBESARTAN 75 MG PO TABS
75.0000 mg | ORAL_TABLET | Freq: Every day | ORAL | Status: DC
  Administered 2015-09-21 – 2015-09-23 (×3): 75 mg via ORAL
  Filled 2015-09-20 (×3): qty 1

## 2015-09-20 MED ORDER — ROSUVASTATIN CALCIUM 5 MG PO TABS
5.0000 mg | ORAL_TABLET | Freq: Every day | ORAL | Status: DC
  Administered 2015-09-21 – 2015-09-22 (×3): 5 mg via ORAL
  Filled 2015-09-20 (×3): qty 1

## 2015-09-20 MED ORDER — LEVOFLOXACIN IN D5W 750 MG/150ML IV SOLN
750.0000 mg | Freq: Once | INTRAVENOUS | Status: AC
  Administered 2015-09-20: 750 mg via INTRAVENOUS
  Filled 2015-09-20: qty 150

## 2015-09-20 MED ORDER — IOPAMIDOL (ISOVUE-370) INJECTION 76%
100.0000 mL | Freq: Once | INTRAVENOUS | Status: AC | PRN
  Administered 2015-09-20: 100 mL via INTRAVENOUS

## 2015-09-20 MED ORDER — LEVOFLOXACIN IN D5W 750 MG/150ML IV SOLN
750.0000 mg | INTRAVENOUS | Status: DC
  Administered 2015-09-21: 750 mg via INTRAVENOUS
  Filled 2015-09-20: qty 150

## 2015-09-20 MED ORDER — CITALOPRAM HYDROBROMIDE 20 MG PO TABS
20.0000 mg | ORAL_TABLET | Freq: Every evening | ORAL | Status: DC
  Administered 2015-09-21 – 2015-09-22 (×2): 20 mg via ORAL
  Filled 2015-09-20 (×3): qty 2

## 2015-09-20 MED ORDER — MONTELUKAST SODIUM 10 MG PO TABS
10.0000 mg | ORAL_TABLET | Freq: Every day | ORAL | Status: DC
  Administered 2015-09-21 – 2015-09-23 (×3): 10 mg via ORAL
  Filled 2015-09-20 (×3): qty 1

## 2015-09-20 MED ORDER — SODIUM CHLORIDE 0.9 % IV BOLUS (SEPSIS)
1000.0000 mL | Freq: Once | INTRAVENOUS | Status: AC
  Administered 2015-09-21: 1000 mL via INTRAVENOUS

## 2015-09-20 MED ORDER — PANTOPRAZOLE SODIUM 40 MG PO TBEC
40.0000 mg | DELAYED_RELEASE_TABLET | Freq: Every day | ORAL | Status: DC
  Administered 2015-09-21 – 2015-09-23 (×3): 40 mg via ORAL
  Filled 2015-09-20 (×3): qty 1

## 2015-09-20 MED ORDER — BACLOFEN 10 MG PO TABS
10.0000 mg | ORAL_TABLET | Freq: Three times a day (TID) | ORAL | Status: DC | PRN
  Administered 2015-09-22: 10 mg via ORAL
  Filled 2015-09-20: qty 1

## 2015-09-20 MED ORDER — FAMOTIDINE 20 MG PO TABS
20.0000 mg | ORAL_TABLET | Freq: Two times a day (BID) | ORAL | Status: DC
  Administered 2015-09-21 – 2015-09-23 (×6): 20 mg via ORAL
  Filled 2015-09-20 (×6): qty 1

## 2015-09-20 MED ORDER — LORATADINE 10 MG PO TABS
10.0000 mg | ORAL_TABLET | Freq: Every day | ORAL | Status: DC
  Administered 2015-09-21 – 2015-09-23 (×3): 10 mg via ORAL
  Filled 2015-09-20 (×3): qty 1

## 2015-09-20 MED ORDER — BUSPIRONE HCL 5 MG PO TABS: 7.5000 mg | ORAL_TABLET | Freq: Two times a day (BID) | ORAL | Status: DC | PRN

## 2015-09-20 MED ORDER — IBUPROFEN 200 MG PO TABS
400.0000 mg | ORAL_TABLET | Freq: Once | ORAL | Status: AC
  Administered 2015-09-20: 400 mg via ORAL
  Filled 2015-09-20: qty 2

## 2015-09-20 NOTE — ED Notes (Signed)
Pt in CT.

## 2015-09-20 NOTE — ED Notes (Signed)
Pt with swelling to left foot, edema and tight to touch, due to edema unable to palpate pulse, cap refills brisk, foot hot to tocuh, lower leg red ( which is not new stated by pt) due to swelling pt is having pain in area. States there is no change with his rt foot.

## 2015-09-20 NOTE — ED Notes (Signed)
Per EMS pt complaint of leg swelling and pain onset Tuesday.

## 2015-09-20 NOTE — H&P (Signed)
History and Physical    Thomas Ramos [id number] DOB: [birth year] DOA: 09/20/2015  PCP: Chesley Noon, MD   Patient coming from: Home (group home)   Chief Complaint: Left leg swelling and redness   HPI: Thomas Ramos is a 45 y.o. male with medical history significant for cerebral palsy with quadriplegia, asthma, depression with anxiety, and hypertension who presents the emergency department for evaluation of swelling and erythema involving the lower left leg since 09/17/2015. Patient reports pain in his usual state of health until approximately 3 days ago when he noted swelling and redness to the lower left leg. He denies any trauma to the site and denies wound. There is no associated pain and the patient reports that he has not experienced similar symptoms previously. He reports subjective fevers over the past 1-2 days. He denies chest pain or palpitations, but endorses a new cough and dyspnea. He denies hemoptysis or history of VTE. Patient denies abdominal pain, nausea, vomiting, or diarrhea. He denies headaches, vision or hearing change, loss of coordination, or focal numbness or weakness.  ED Course: Upon arrival to the ED, patient is found to be febrile to 38.2 C, saturating adequately on room air, but tachypneic to 38, tachycardic in the 120s, and with stable blood pressure. EKG demonstrates a sinus rhythm with tachycardia at 112 and right axis deviation. BMP is notable for hyperglycemia to 173 but otherwise unremarkable. CBC features a leukocytosis to [id number] with neutrophilic predominance. Lactic acid is reassuring at 1.0. There was concern for PE and CTA PE study was obtained. CTA is negative for PE, but right upper lobe pneumonia is identified, as well as wall thickening along the distal esophagus suggestive of chronic inflammation versus esophagitis. Blood cultures were obtained, symptomatic care was provided with Advil and Tylenol, and empiric treatment of community-acquired pneumonia  and suspected cellulitis was initiated with Levaquin and clindamycin, respectively. Patient remained stable in the emergency department and will be observed on the telemetry unit for ongoing evaluation and management of community-acquired pneumonia and left lower extremity swelling and erythema suggestive of possible cellulitis versus DVT.  Review of Systems:  All other systems reviewed and apart from HPI, are negative.  Past Medical History  Diagnosis Date  . Motility disorder, esophageal   . Esophageal stricture   . Hypertension   . Pneumonia   . GERD (gastroesophageal reflux disease)   . CP (cerebral palsy) ([place])   . S/P Botox injection     approx every 4 months  . Seasonal allergies   . Environmental allergies     takes inhalers if needed  . 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[place])   . Cerebral palsy ([place])   . 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 Anxiety     Past Surgical History  Procedure Laterality Date  . Tendon release    . Mouth surgery    . Esophagus surgery      stretched esophagus  . Legs    . Esophagogastroduodenoscopy N/A 12/21/2012    Procedure: ESOPHAGOGASTRODUODENOSCOPY (EGD);  Surgeon: Inda Castle, MD;  Location: [facility];  Service: Endoscopy;  Laterality: N/A;  . Botox injection N/A 12/21/2012    Procedure: BOTOX INJECTION;  Surgeon: Inda Castle, MD;  Location: [facility];  Service: Endoscopy;  Laterality: N/A;  . Esophagogastroduodenoscopy N/A 06/28/2014    Procedure: ESOPHAGOGASTRODUODENOSCOPY (EGD);  Surgeon: Inda Castle, MD;  Location: [facility];  Service: Endoscopy;  Laterality: N/A;  . Botox injection N/A 06/28/2014  Procedure: BOTOX INJECTION;  Surgeon: Inda Castle, MD;  Location: [facility];  Service: Endoscopy;  Laterality: N/A;     reports that he has quit smoking. He has quit using smokeless tobacco. He reports that he drinks alcohol. He reports that he does not use illicit drugs.  Allergies    Allergen Reactions  . Metronidazole Nausea And Vomiting  . Penicillins Hives and Nausea And Vomiting    Has patient had a PCN reaction causing immediate rash, facial/tongue/throat swelling, SOB or lightheadedness with hypotension: yes Has patient had a PCN reaction causing severe rash involving mucus membranes or skin necrosis: no Has patient had a PCN reaction that required hospitalization: no Has patient had a PCN reaction occurring within the last 10 years: no If all of the above answers are "NO", then may proceed with Cephalosporin use.     Family History  Problem Relation Age of Onset  . Hypertension Father   . Lung cancer Father   . Diabetes Mother      Prior to Admission medications   Medication Sig Start Date End Date Taking? Authorizing Provider  albuterol (PROVENTIL HFA;VENTOLIN HFA) 108 (90 Base) MCG/ACT inhaler Inhale 1-2 puffs into the lungs every 6 (six) hours as needed for wheezing or shortness of breath. 08/26/15  Yes Ripley Fraise, MD  amLODipine (NORVASC) 10 MG tablet Take 10 mg by mouth daily.   Yes Historical Provider, MD  baclofen (LIORESAL) 10 MG tablet Take 10 mg by mouth 3 (three) times daily as needed. For neck stiffness/pain. 08/30/15  Yes Historical Provider, MD  busPIRone (BUSPAR) 7.5 MG tablet Take 7.5 mg by mouth 2 (two) times daily as needed (for anxiousness).    Yes Historical Provider, MD  cetirizine (ZYRTEC) 10 MG tablet Take 10 mg by mouth daily.  03/01/14  Yes Historical Provider, MD  citalopram (CELEXA) 20 MG tablet Take 20 mg by mouth every evening.   Yes Historical Provider, MD  CRESTOR 5 MG tablet Take 5 mg by mouth at bedtime. 04/22/14  Yes Historical Provider, MD  fluticasone (FLONASE) 50 MCG/ACT nasal spray Place 1 spray into both nostrils daily.  03/01/14  Yes Historical Provider, MD  montelukast (SINGULAIR) 10 MG tablet Take 10 mg by mouth daily.   Yes Historical Provider, MD  nitroGLYCERIN (NITROSTAT) 0.4 MG SL tablet Place 0.4 mg under the  tongue every 5 (five) minutes as needed. For chest pain. 08/26/15  Yes Historical Provider, MD  omeprazole (PRILOSEC) 20 MG capsule Take 1 capsule (20 mg total) by mouth daily. 08/11/15  Yes April Palumbo, MD  ranitidine (ZANTAC) 150 MG tablet Take 150 mg by mouth 2 (two) times daily as needed. For heartburn. 08/26/15  Yes Historical Provider, MD  valsartan (DIOVAN) 80 MG tablet Take 80 mg by mouth daily. 08/26/15  Yes Historical Provider, MD  sucralfate (CARAFATE) 1 GM/10ML suspension Take 10 mLs (1 g total) by mouth 4 (four) times daily -  with meals and at bedtime. Patient not taking: Reported on 09/20/2015 08/11/15   April Palumbo, MD    Physical Exam: Marcelline Mates:   09/20/15 2153 09/20/15 2207 09/20/15 2212 09/20/15 2300  BP:   141/98 139/95  Pulse:  110    Temp: 100.7 F (38.2 C)     TempSrc: Rectal     Resp:  32 18 18  SpO2:  96%        Constitutional: NAD, calm, comfortable, sweaty  Eyes: PERTLA, lids and conjunctivae normal ENMT: Mucous membranes are moist. Posterior pharynx  clear of any exudate or lesions.   Neck: normal, supple, no masses, no thyromegaly Respiratory: Mildly diminished with coarse rhonchi b/l. Normal respiratory effort. No accessory muscle use.  Cardiovascular: Rate ~120 and regular without significant murmur. BLE edema, worse on left. No carotid bruits. No significant JVD. Abdomen: No distension, no tenderness, no masses palpated. Bowel sounds normal.  Musculoskeletal: Contractures involving all extremities. LLE with edema, erythema, no drainage, no palpable cord.  Skin: Leg findings as above. Otherwise, skin is warm, dry, well-perfused. Neurologic: CN 2-12 grossly intact. Sensation intact, quadriplegic with contractures.  Psychiatric: Normal judgment and insight. Alert and oriented x 3. Normal mood and affect.     Labs on Admission: I have personally reviewed following labs and imaging studies  CBC:  Recent Labs Lab 09/20/15 1946  WBC 15.2*    NEUTROABS 13.4*  HGB 13.3  HCT 37.6*  MCV 78.2  PLT 0000000   Basic Metabolic Panel:  Recent Labs Lab 09/20/15 1946  NA 135  K 3.8  CL 103  CO2 25  GLUCOSE 173*  BUN 17  CREATININE 0.56*  CALCIUM 9.3   GFR: CrCl cannot be calculated (Unknown ideal weight.). Liver Function Tests: No results for input(s): AST, ALT, ALKPHOS, BILITOT, PROT, ALBUMIN in the last 168 hours. No results for input(s): LIPASE, AMYLASE in the last 168 hours. No results for input(s): AMMONIA in the last 168 hours. Coagulation Profile: No results for input(s): INR, PROTIME in the last 168 hours. Cardiac Enzymes: No results for input(s): CKTOTAL, CKMB, CKMBINDEX, TROPONINI in the last 168 hours. BNP (last 3 results) No results for input(s): PROBNP in the last 8760 hours. HbA1C: No results for input(s): HGBA1C in the last 72 hours. CBG: No results for input(s): GLUCAP in the last 168 hours. Lipid Profile: No results for input(s): CHOL, HDL, LDLCALC, TRIG, CHOLHDL, LDLDIRECT in the last 72 hours. Thyroid Function Tests: No results for input(s): TSH, T4TOTAL, FREET4, T3FREE, THYROIDAB in the last 72 hours. Anemia Panel: No results for input(s): VITAMINB12, FOLATE, FERRITIN, TIBC, IRON, RETICCTPCT in the last 72 hours. Urine analysis:    Component Value Date/Time   COLORURINE YELLOW 07/01/2015 0143   APPEARANCEUR CLEAR 07/01/2015 0143   LABSPEC 1.031* 07/01/2015 0143   PHURINE 5.5 07/01/2015 0143   GLUCOSEU NEGATIVE 07/01/2015 0143   HGBUR NEGATIVE 07/01/2015 0143   BILIRUBINUR SMALL* 07/01/2015 0143   KETONESUR NEGATIVE 07/01/2015 0143   PROTEINUR NEGATIVE 07/01/2015 0143   UROBILINOGEN 1.0 05/26/2014 2211   NITRITE NEGATIVE 07/01/2015 0143   LEUKOCYTESUR NEGATIVE 07/01/2015 0143   Sepsis Labs: @LABRCNTIP (procalcitonin:4,lacticidven:4) )No results found for this or any previous visit (from the past 240 hour(s)).   Radiological Exams on Admission: Ct Angio Chest Pe W/cm &/or Wo  Cm  09/20/2015  CLINICAL DATA:  Acute onset of left lower extremity swelling and dyspnea. Tachypnea and tachycardia. Initial encounter. EXAM: CT ANGIOGRAPHY CHEST WITH CONTRAST TECHNIQUE: Multidetector CT imaging of the chest was performed using the standard protocol during bolus administration of intravenous contrast. Multiplanar CT image reconstructions and MIPs were obtained to evaluate the vascular anatomy. CONTRAST:  80 mL of Isovue 370 IV contrast COMPARISON:  CTA of the chest performed 08/11/2015, and chest radiograph performed 08/07/2015 FINDINGS: There is no evidence of pulmonary embolus. Mild patchy focal airspace opacification is noted at the right upper lobe, compatible with pneumonia. Evaluation is somewhat suboptimal due to motion artifact. There is no evidence of pleural effusion or pneumothorax. No masses are identified; no abnormal focal contrast enhancement is seen.  A moderate to large hiatal hernia is noted. There is wall thickening along the distal esophagus, which may reflect chronic inflammation or possibly esophagitis. The mediastinum is otherwise unremarkable. No mediastinal lymphadenopathy is seen. No pericardial effusion is identified. The great vessels are grossly unremarkable in appearance. No axillary lymphadenopathy is seen. The visualized portions of the thyroid gland are unremarkable in appearance. The visualized portions of the liver and spleen are unremarkable. No acute osseous abnormalities are seen. Review of the MIP images confirms the above findings. IMPRESSION: 1. No evidence of pulmonary embolus. 2. Right upper lobe pneumonia noted. 3. Moderate to large hiatal hernia seen. 4. Wall thickening along the distal esophagus, which may reflect chronic inflammation or possibly esophagitis. Electronically Signed   By: Garald Balding M.D.   On: 09/20/2015 22:32    EKG: Independently reviewed. Sinus tachycardia (rate 112), right axis-deviation   Assessment/Plan  1. CAP - RUL  infiltrate noted on CTA  - Saturating adequately on rm air, but tachypneic  - Empiric treatment initiated with Levaquin, will continue  - Blood cultures incubating, sputum culture requested  - Lactate reassuring at 1.0  - Trend PCT, check urine for antigens to strep pneumo and legionella    2. LLE swelling and erythema  - Uncertain etiology, most likely DVT vs cellulitis  - There are systemic infectious features present, but pt also with CAP  - An empiric dose of clindamycin was given in ED for suspected non-purulent cellulitis with severe penicillin allergy  - Rule-out DVT with venous dopplers; if negative, continue empiric clindamycin for likely cellulitis    3. Cerebral palsy with quadraplegia  - Is status-post remote tendon release  - Continue baclofen prn   4. Hypertension  - At goal currently  - Managed with Norvasc and valsartan at home, will continue (irbesartan in place of valsartan while in hospital)   5. Hyperlipidemia  - Continue current treatment with Crestor    6. Asthma, seasonal allergy  - No wheezing at time of admission, saturating adequately on rm air  - Albuterol neb prn SOB or wheezing  - Continue Singulair, Flonase, 2nd gen antihistamine    7. GERD - Stable  - Managed with Zantac and Prilosec at home  - Continue H2-blocker and PPI with Pepcid and Protonix while in hospital  - CTA demonstrates thickening of distal esophageal wall, possibly reflective of esophagitis  - Followed by GI in outpatient setting   8. Depression with anxiety  - Stable  - Continue current management with Buspar, Celexa    DVT prophylaxis: sq Lovenox  Code Status: Full  Family Communication: Discussed with patient  Disposition Plan: Observe on telemetry  Consults called: None  Admission status: Observation     [name], MD [facility] Pager [phone]  If 7PM-7AM, please contact night-coverage [email or web address] Password [email or web address]  09/20/2015, 11:44 PM

## 2015-09-20 NOTE — ED Notes (Signed)
Hospitalist at bedside 

## 2015-09-20 NOTE — ED Provider Notes (Signed)
Medical screening examination/treatment/procedure(s) were conducted as a shared visit with non-physician practitioner(s) and myself.  I personally evaluated the patient during the encounter.   EKG Interpretation None     Patient here with left lower extremity swelling and edema as well as rash. Has been more dyspneic. Leg swelling has been going on times several days. Patient is tachycardic and tachypnea. Concern for pulmonary and was in. Will obtain chest CT and results are pending  Lacretia Leigh, MD 09/20/15 2114

## 2015-09-20 NOTE — ED Provider Notes (Signed)
CSN: [id number]     Arrival date & time 09/20/15  1808 History   First MD Initiated Contact with Patient 09/20/15 1840     Chief Complaint  Patient presents with  . Leg Pain  . Leg Swelling     (Consider location/radiation/quality/duration/timing/severity/associated sxs/prior Treatment) HPI Comments: Thomas Ramos is a 45 y.o. male with history of congenital cerebral palsy with quadriplegia, HTN, and GERD who lives at home presents to ED with left lower extremity swelling and pain. Pt states he first noticed "red spots" on his left lower extremity Thursday night. Symptoms have progressively worsened to include left lower extremity swelling and pain. Today, he felt overall "unwell" and came to ED for evaluation. Pain is 9/10. He has not taken anything for pain. Positive chills, palpitations, dyspneic, and non-productive cough. No fever, sore throat, trouble swallowing, changes in vision, chest pain, abdominal complaints, urinary complaints, or headache. He is non-ambulatory at baseline.   The history is provided by the patient and medical records.    Past Medical History  Diagnosis Date  . Motility disorder, esophageal   . Esophageal stricture   . Hypertension   . Pneumonia   . GERD (gastroesophageal reflux disease)   . CP (cerebral palsy) ([place])   . S/P Botox injection     approx every 4 months  . Seasonal allergies   . Environmental allergies     takes inhalers if needed  . 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[place])   . Cerebral palsy ([place])   . 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 Anxiety    Past Surgical History  Procedure Laterality Date  . Tendon release    . Mouth surgery    . Esophagus surgery      stretched esophagus  . Legs    . Esophagogastroduodenoscopy N/A 12/21/2012    Procedure: ESOPHAGOGASTRODUODENOSCOPY (EGD);  Surgeon: Inda Castle, MD;  Location: [facility];  Service: Endoscopy;  Laterality: N/A;  . Botox injection N/A 12/21/2012   Procedure: BOTOX INJECTION;  Surgeon: Inda Castle, MD;  Location: [facility];  Service: Endoscopy;  Laterality: N/A;  . Esophagogastroduodenoscopy N/A 06/28/2014    Procedure: ESOPHAGOGASTRODUODENOSCOPY (EGD);  Surgeon: Inda Castle, MD;  Location: [facility];  Service: Endoscopy;  Laterality: N/A;  . Botox injection N/A 06/28/2014    Procedure: BOTOX INJECTION;  Surgeon: Inda Castle, MD;  Location: [facility];  Service: Endoscopy;  Laterality: N/A;   Family History  Problem Relation Age of Onset  . Hypertension Father   . Lung cancer Father   . Diabetes Mother    Social History  Substance Use Topics  . Smoking status: Former Research scientist (life sciences)  . Smokeless tobacco: Former Systems developer     Comment: smoked one time  . Alcohol Use: 0.0 oz/week     Comment: 2 beers every other Friday and Saturday    Review of Systems  Constitutional: Positive for chills.  Respiratory: Positive for cough ( nonproductive) and shortness of breath.   Cardiovascular: Positive for palpitations and leg swelling.  Skin: Positive for color change and rash.  Neurological:       Cerebral palsy with quadrepleia  All other systems reviewed and are negative.     Allergies  Metronidazole and Penicillins  Home Medications   Prior to Admission medications   Medication Sig Start Date End Date Taking? Authorizing Provider  albuterol (PROVENTIL HFA;VENTOLIN HFA) 108 (90 Base) MCG/ACT inhaler Inhale 1-2 puffs into the lungs every 6 (six) hours  as needed for wheezing or shortness of breath. 08/26/15  Yes Ripley Fraise, MD  amLODipine (NORVASC) 10 MG tablet Take 10 mg by mouth daily.   Yes Historical Provider, MD  baclofen (LIORESAL) 10 MG tablet Take 10 mg by mouth 3 (three) times daily as needed. For neck stiffness/pain. 08/30/15  Yes Historical Provider, MD  busPIRone (BUSPAR) 7.5 MG tablet Take 7.5 mg by mouth 2 (two) times daily as needed (for anxiousness).    Yes Historical Provider, MD  cetirizine (ZYRTEC) 10 MG  tablet Take 10 mg by mouth daily.  03/01/14  Yes Historical Provider, MD  citalopram (CELEXA) 20 MG tablet Take 20 mg by mouth every evening.   Yes Historical Provider, MD  CRESTOR 5 MG tablet Take 5 mg by mouth at bedtime. 04/22/14  Yes Historical Provider, MD  fluticasone (FLONASE) 50 MCG/ACT nasal spray Place 1 spray into both nostrils daily.  03/01/14  Yes Historical Provider, MD  montelukast (SINGULAIR) 10 MG tablet Take 10 mg by mouth daily.   Yes Historical Provider, MD  nitroGLYCERIN (NITROSTAT) 0.4 MG SL tablet Place 0.4 mg under the tongue every 5 (five) minutes as needed. For chest pain. 08/26/15  Yes Historical Provider, MD  omeprazole (PRILOSEC) 20 MG capsule Take 1 capsule (20 mg total) by mouth daily. 08/11/15  Yes April Palumbo, MD  ranitidine (ZANTAC) 150 MG tablet Take 150 mg by mouth 2 (two) times daily as needed. For heartburn. 08/26/15  Yes Historical Provider, MD  valsartan (DIOVAN) 80 MG tablet Take 80 mg by mouth daily. 08/26/15  Yes Historical Provider, MD  sucralfate (CARAFATE) 1 GM/10ML suspension Take 10 mLs (1 g total) by mouth 4 (four) times daily -  with meals and at bedtime. Patient not taking: Reported on 09/20/2015 08/11/15   April Palumbo, MD   BP 123/79 mmHg  Pulse 89  Temp(Src) 97.8 F (36.6 C) (Oral)  Resp 28  Ht 5' (1.524 m)  Wt 79.742 kg  BMI 34.33 kg/m2  SpO2 99% Physical Exam  Constitutional: He appears well-nourished. He appears ill.  HENT:  Head: Normocephalic and atraumatic.  Mouth/Throat: Uvula is midline, oropharynx is clear and moist and mucous membranes are normal. No trismus in the jaw. No oropharyngeal exudate.  Eyes: Conjunctivae and EOM are normal. Pupils are equal, round, and reactive to light. Right eye exhibits no discharge. Left eye exhibits no discharge. No scleral icterus.  Cardiovascular: Regular rhythm and normal heart sounds.  Tachycardia present.   No murmur heard. Pulses:      Radial pulses are 2+ on the right side, and 2+ on the  left side.  Capillary refill <3 second in lower extremities b/l  Pulmonary/Chest: Tachypnea noted. No respiratory distress. He has no wheezes. He has rhonchi. He has no rales.  Rhonchi heard on lung exam.   Abdominal: Soft. Bowel sounds are normal. There is no tenderness. There is no rebound and no guarding.  Musculoskeletal: He exhibits edema.  Muscle contractures noted. B/l lower extremity swelling, worse on left compared to right. No TTP of posterior left calf.   Neurological: He is alert.  Skin: Skin is warm. Petechiae noted. He is diaphoretic. There is erythema.  Warm, erythematous left lower extremity with diffuse non-blanching petechiae.   Psychiatric: He has a normal mood and affect. His behavior is normal.    ED Course  Procedures (including critical care time) Labs Review Labs Reviewed  CBC WITH DIFFERENTIAL/PLATELET - Abnormal; Notable for the following:    WBC 15.2 (*)  HCT 37.6 (*)    Neutro Abs 13.4 (*)    All other components within normal limits  BASIC METABOLIC PANEL - Abnormal; Notable for the following:    Glucose, Bld 173 (*)    Creatinine, Ser 0.56 (*)    All other components within normal limits  CULTURE, BLOOD (ROUTINE X 2)  CULTURE, BLOOD (ROUTINE X 2)  CULTURE, EXPECTORATED SPUTUM-ASSESSMENT  GRAM STAIN  LACTIC ACID, PLASMA  PROCALCITONIN  HIV ANTIBODY (ROUTINE TESTING)  STREP PNEUMONIAE URINARY ANTIGEN  LEGIONELLA PNEUMOPHILA SEROGP 1 UR AG  I-STAT CG4 LACTIC ACID, ED    Imaging Review Ct Angio Chest Pe W/cm &/or Wo Cm  09/20/2015  CLINICAL DATA:  Acute onset of left lower extremity swelling and dyspnea. Tachypnea and tachycardia. Initial encounter. EXAM: CT ANGIOGRAPHY CHEST WITH CONTRAST TECHNIQUE: Multidetector CT imaging of the chest was performed using the standard protocol during bolus administration of intravenous contrast. Multiplanar CT image reconstructions and MIPs were obtained to evaluate the vascular anatomy. CONTRAST:  80 mL of Isovue  370 IV contrast COMPARISON:  CTA of the chest performed 08/11/2015, and chest radiograph performed 08/07/2015 FINDINGS: There is no evidence of pulmonary embolus. Mild patchy focal airspace opacification is noted at the right upper lobe, compatible with pneumonia. Evaluation is somewhat suboptimal due to motion artifact. There is no evidence of pleural effusion or pneumothorax. No masses are identified; no abnormal focal contrast enhancement is seen. A moderate to large hiatal hernia is noted. There is wall thickening along the distal esophagus, which may reflect chronic inflammation or possibly esophagitis. The mediastinum is otherwise unremarkable. No mediastinal lymphadenopathy is seen. No pericardial effusion is identified. The great vessels are grossly unremarkable in appearance. No axillary lymphadenopathy is seen. The visualized portions of the thyroid gland are unremarkable in appearance. The visualized portions of the liver and spleen are unremarkable. No acute osseous abnormalities are seen. Review of the MIP images confirms the above findings. IMPRESSION: 1. No evidence of pulmonary embolus. 2. Right upper lobe pneumonia noted. 3. Moderate to large hiatal hernia seen. 4. Wall thickening along the distal esophagus, which may reflect chronic inflammation or possibly esophagitis. Electronically Signed   By: Garald Balding M.D.   On: 09/20/2015 22:32   I have personally reviewed and evaluated these images and lab results as part of my medical decision-making.   EKG Interpretation None       MDM   Final diagnoses:  Aspiration pneumonia of right upper lobe, unspecified aspiration pneumonia type ([place])   Pt is tachypneic, tachycardic, and hypoxic sating in low 90's. Initial PO temp nml; however, rectal temp febrile at 100.66F. Pt given tylenol and ibuprofen. Exam remarkable for rhonchi and b/l lower extremity swelling worse on left compared to right with left lower extremity erythema, warmth, and  petechia. CBC remarkable for leukocytosis and left shift. CMP positive for hyperglycemia. IV clindamycin initiated for ?cellulitis vs. ?VTE. Discussed pt with Dr. Zenia Resides who also evaluated pt. Concern for possible PE - CTA ordered. Check EKG and lactic acid; obtain blood cultures.    Lactic acid normal. EKG shows sinus tachycardia with RAD. CTA negative for PE; however, positive for RUL PNA and wall thickening of distal esophagus questionable of whether chronic inflammation vs. esophagitis. IVF ABX for CAP initiated. Consult to hospitalist for admission for RUL PNA and lower extremity swelling and erythema.   Consulted Dr. Myna Hidalgo of [facility], appreciated his time. Will admit to observation telemetry for further evaluation and management of RUL PNA and lower  extremity swelling and erythema.       Macario Golds [place], [place] 09/21/15 [id number]  Lacretia Leigh, MD 09/22/15 Dyann Kief

## 2015-09-21 ENCOUNTER — Encounter (HOSPITAL_COMMUNITY): Payer: Self-pay | Admitting: *Deleted

## 2015-09-21 ENCOUNTER — Observation Stay (HOSPITAL_COMMUNITY): Payer: Medicare Other

## 2015-09-21 DIAGNOSIS — R0682 Tachypnea, not elsewhere classified: Secondary | ICD-10-CM | POA: Diagnosis present

## 2015-09-21 DIAGNOSIS — J189 Pneumonia, unspecified organism: Principal | ICD-10-CM

## 2015-09-21 DIAGNOSIS — Z8249 Family history of ischemic heart disease and other diseases of the circulatory system: Secondary | ICD-10-CM | POA: Diagnosis not present

## 2015-09-21 DIAGNOSIS — G800 Spastic quadriplegic cerebral palsy: Secondary | ICD-10-CM | POA: Diagnosis not present

## 2015-09-21 DIAGNOSIS — M7989 Other specified soft tissue disorders: Secondary | ICD-10-CM

## 2015-09-21 DIAGNOSIS — R1310 Dysphagia, unspecified: Secondary | ICD-10-CM | POA: Diagnosis present

## 2015-09-21 DIAGNOSIS — Z79899 Other long term (current) drug therapy: Secondary | ICD-10-CM | POA: Diagnosis not present

## 2015-09-21 DIAGNOSIS — G809 Cerebral palsy, unspecified: Secondary | ICD-10-CM | POA: Diagnosis not present

## 2015-09-21 DIAGNOSIS — Z888 Allergy status to other drugs, medicaments and biological substances status: Secondary | ICD-10-CM | POA: Diagnosis not present

## 2015-09-21 DIAGNOSIS — F419 Anxiety disorder, unspecified: Secondary | ICD-10-CM | POA: Diagnosis present

## 2015-09-21 DIAGNOSIS — E785 Hyperlipidemia, unspecified: Secondary | ICD-10-CM | POA: Diagnosis present

## 2015-09-21 DIAGNOSIS — Z7984 Long term (current) use of oral hypoglycemic drugs: Secondary | ICD-10-CM | POA: Diagnosis not present

## 2015-09-21 DIAGNOSIS — J45909 Unspecified asthma, uncomplicated: Secondary | ICD-10-CM | POA: Diagnosis present

## 2015-09-21 DIAGNOSIS — K219 Gastro-esophageal reflux disease without esophagitis: Secondary | ICD-10-CM | POA: Diagnosis present

## 2015-09-21 DIAGNOSIS — R0902 Hypoxemia: Secondary | ICD-10-CM | POA: Diagnosis present

## 2015-09-21 DIAGNOSIS — Z7951 Long term (current) use of inhaled steroids: Secondary | ICD-10-CM | POA: Diagnosis not present

## 2015-09-21 DIAGNOSIS — G808 Other cerebral palsy: Secondary | ICD-10-CM | POA: Diagnosis present

## 2015-09-21 DIAGNOSIS — Z87891 Personal history of nicotine dependence: Secondary | ICD-10-CM | POA: Diagnosis not present

## 2015-09-21 DIAGNOSIS — M79672 Pain in left foot: Secondary | ICD-10-CM | POA: Diagnosis present

## 2015-09-21 DIAGNOSIS — Z801 Family history of malignant neoplasm of trachea, bronchus and lung: Secondary | ICD-10-CM | POA: Diagnosis not present

## 2015-09-21 DIAGNOSIS — F329 Major depressive disorder, single episode, unspecified: Secondary | ICD-10-CM | POA: Diagnosis present

## 2015-09-21 DIAGNOSIS — Z833 Family history of diabetes mellitus: Secondary | ICD-10-CM | POA: Diagnosis not present

## 2015-09-21 DIAGNOSIS — L03116 Cellulitis of left lower limb: Secondary | ICD-10-CM

## 2015-09-21 DIAGNOSIS — R7303 Prediabetes: Secondary | ICD-10-CM | POA: Diagnosis present

## 2015-09-21 DIAGNOSIS — I10 Essential (primary) hypertension: Secondary | ICD-10-CM | POA: Diagnosis present

## 2015-09-21 DIAGNOSIS — E119 Type 2 diabetes mellitus without complications: Secondary | ICD-10-CM | POA: Diagnosis not present

## 2015-09-21 DIAGNOSIS — I872 Venous insufficiency (chronic) (peripheral): Secondary | ICD-10-CM | POA: Insufficient documentation

## 2015-09-21 DIAGNOSIS — Z880 Allergy status to penicillin: Secondary | ICD-10-CM | POA: Diagnosis not present

## 2015-09-21 LAB — STREP PNEUMONIAE URINARY ANTIGEN: Strep Pneumo Urinary Antigen: NEGATIVE

## 2015-09-21 LAB — PROCALCITONIN: Procalcitonin: 0.26 ng/mL

## 2015-09-21 LAB — HIV ANTIBODY (ROUTINE TESTING W REFLEX): HIV Screen 4th Generation wRfx: NONREACTIVE

## 2015-09-21 MED ORDER — HYDROCODONE-ACETAMINOPHEN 5-325 MG PO TABS
2.0000 | ORAL_TABLET | Freq: Once | ORAL | Status: AC
Start: 2015-09-21 — End: 2015-09-21
  Administered 2015-09-21: 2 via ORAL
  Filled 2015-09-21: qty 2

## 2015-09-21 NOTE — Care Management Obs Status (Signed)
[place] NOTIFICATION   Patient Details  Name: Thomas Ramos MRN: [id number] Date of Birth: Nov 10, 1970   Medicare Observation Status Notification Given:       Erenest Rasher, RN 09/21/2015, 3:51 PM

## 2015-09-21 NOTE — Progress Notes (Addendum)
*  Preliminary Results* Bilateral lower extremity venous duplex completed. Study was very technically limited due to edema, patient body habitus, and patient's contracted state. Unable to adequately evaluate majority of bilateral lower extremity veins due to technical limitations. There is no obvious evidence of deep vein thrombosis, however this exam is grossly inconclusive.  There is a heterogenous area of the left groin measuring 3.4cm; suggestive of possible enlarged inguinal lymph node.  09/21/2015  Maudry Mayhew, RVT, RDCS, RDMS

## 2015-09-21 NOTE — Progress Notes (Signed)
PROGRESS NOTE    Thomas Ramos  [id number] DOB: 1970/08/07 DOA: 09/20/2015 PCP: Chesley Noon, MD    Brief Narrative:  45 y.o. male with medical history significant for cerebral palsy with quadriplegia, asthma, depression with anxiety, and hypertension who presents the emergency department for evaluation of swelling and erythema involving the lower left leg since 09/17/2015. Patient reports pain in his usual state of health until approximately 3 days prior to admission when he noted swelling and redness to the lower left leg.  Assessment & Plan:   Principal Problem:   Community acquired pneumonia Active Problems:   Dysphagia   HTN (hypertension)   Asthma   GERD (gastroesophageal reflux disease)   Cerebral palsy (HCC)   Depression with anxiety   Cellulitis   CAP (community acquired pneumonia)   Leg swelling  1. CAP - RUL infiltrate noted on CTA  - Saturating adequately on room air, but tachypneic  - Empiric treatment initiated with Levaquin, will continue  - Blood cultures pending, sputum culture requested  - Lactate on admission within normal limits. - Urine antigen negative for strep pneumo  2. LLE swelling and erythema  - Uncertain etiology, most likely DVT vs cellulitis  - There are systemic infectious features present, but pt also with CAP  - An empiric dose of clindamycin was given in ED for suspected non-purulent cellulitis with severe penicillin allergy  - Pending lower extremity venous dopplers; if negative, continue empiric clindamycin for likely cellulitis   3. Cerebral palsy with quadraplegia  - Is status-post remote tendon release  - Continue baclofen prn   4. Hypertension  - At goal currently  - Managed with Norvasc and valsartan at home, will continue (irbesartan in place of valsartan while in hospital)   5. Hyperlipidemia  - Continue current treatment with Crestor   6. Asthma, seasonal allergy  - No wheezing at time of  admission, saturating adequately on rm air  - Albuterol neb prn SOB or wheezing  - Continue Singulair, Flonase, 2nd gen antihistamine   7. GERD - Stable  - Managed with Zantac and Prilosec at home  - Continue H2-blocker and PPI with Pepcid and Protonix while in hospital  - CTA demonstrates thickening of distal esophageal wall, possibly reflective of esophagitis  - Followed by GI in outpatient setting   8. Depression with anxiety  - Stable  - Continue current management with Buspar, Celexa    DVT prophylaxis: Lovenox subcutaneous Code Status: Full Family Communication: Patient in room, family not at bedside Disposition Plan: Possible discharge in 24-48 hours   Consultants:     Procedures:     Antimicrobials: Anti-infectives    Start     Dose/Rate Route Frequency Ordered Stop   09/21/15 2300  levofloxacin (LEVAQUIN) IVPB 750 mg     750 mg 100 mL/hr over 90 Minutes Intravenous Every 24 hours 09/20/15 2343 09/26/15 2259   09/21/15 0600  clindamycin (CLEOCIN) IVPB 600 mg     600 mg 100 mL/hr over 30 Minutes Intravenous Every 8 hours 09/20/15 2343     09/20/15 2300  levofloxacin (LEVAQUIN) IVPB 750 mg     750 mg 100 mL/hr over 90 Minutes Intravenous  Once 09/20/15 2255 09/21/15 0043   09/20/15 2015  clindamycin (CLEOCIN) IVPB 600 mg     600 mg 100 mL/hr over 30 Minutes Intravenous  Once 09/20/15 2000 09/20/15 2053           Subjective: Patient eager to go home today.  Objective: Filed  Vitals:   09/21/15 0017 09/21/15 0440 09/21/15 1149 09/21/15 1327  BP: 108/60 123/79 140/78 147/90  Pulse: 104 89 100 107  Temp: 98.7 F (37.1 C) 97.8 F (36.6 C)  98.9 F (37.2 C)  TempSrc: Oral Oral  Oral  Resp: 34 28  28  Height: 5' (1.524 m)     Weight: 79.742 kg (175 lb 12.8 oz)     SpO2: 98% 99%  97%    Intake/Output Summary (Last 24 hours) at 09/21/15 1450 Last data filed at 09/21/15 1300  Gross per 24 hour  Intake 2119.17 ml  Output    825 ml  Net  1294.17 ml   Filed Weights   09/21/15 0017  Weight: 79.742 kg (175 lb 12.8 oz)    Examination:  General exam: Appears calm and comfortable, Lying in bed  Respiratory system: Clear to auscultation. Respiratory effort normal. Cardiovascular system: S1 & S2 heard, RRR.Marland Kitchen Gastrointestinal system: Abdomen is nondistended, soft and nontender. No organomegaly or masses felt. Normal bowel sounds heard. Central nervous system: Alert and oriented. Baseline for extremity weakness, with contractures Extremities: Symmetric 5 x 5 power. Skin: No rashes, lesions or ulcers Psychiatry: Judgement and insight appear normal. Mood & affect appropriate.     Data Reviewed: I have personally reviewed following labs and imaging studies  CBC:  Recent Labs Lab 09/20/15 1946  WBC 15.2*  NEUTROABS 13.4*  HGB 13.3  HCT 37.6*  MCV 78.2  PLT 0000000   Basic Metabolic Panel:  Recent Labs Lab 09/20/15 1946  NA 135  K 3.8  CL 103  CO2 25  GLUCOSE 173*  BUN 17  CREATININE 0.56*  CALCIUM 9.3   GFR: Estimated Creatinine Clearance: 104.2 mL/min (by C-G formula based on Cr of 0.56). Liver Function Tests: No results for input(s): AST, ALT, ALKPHOS, BILITOT, PROT, ALBUMIN in the last 168 hours. No results for input(s): LIPASE, AMYLASE in the last 168 hours. No results for input(s): AMMONIA in the last 168 hours. Coagulation Profile: No results for input(s): INR, PROTIME in the last 168 hours. Cardiac Enzymes: No results for input(s): CKTOTAL, CKMB, CKMBINDEX, TROPONINI in the last 168 hours. BNP (last 3 results) No results for input(s): PROBNP in the last 8760 hours. HbA1C: No results for input(s): HGBA1C in the last 72 hours. CBG: No results for input(s): GLUCAP in the last 168 hours. Lipid Profile: No results for input(s): CHOL, HDL, LDLCALC, TRIG, CHOLHDL, LDLDIRECT in the last 72 hours. Thyroid Function Tests: No results for input(s): TSH, T4TOTAL, FREET4, T3FREE, THYROIDAB in the last 72  hours. Anemia Panel: No results for input(s): VITAMINB12, FOLATE, FERRITIN, TIBC, IRON, RETICCTPCT in the last 72 hours. Sepsis Labs:  Recent Labs Lab 09/20/15 2143 09/21/15 0040  PROCALCITON  --  0.26  LATICACIDVEN 1.0  --     Recent Results (from the past 240 hour(s))  Culture, blood (Routine X 2) w Reflex to ID Panel     Status: None (Preliminary result)   Collection Time: 09/20/15  9:30 PM  Result Value Ref Range Status   Specimen Description BLOOD LEFT ANTECUBITAL  Final   Special Requests BOTTLES DRAWN AEROBIC AND ANAEROBIC 5CC  Final   Culture   Final    NO GROWTH < 12 HOURS Performed at [facility]    Report Status PENDING  Incomplete         Radiology Studies: Ct Angio Chest Pe W/cm &/or Wo Cm  09/20/2015  CLINICAL DATA:  Acute onset of left lower extremity  swelling and dyspnea. Tachypnea and tachycardia. Initial encounter. EXAM: CT ANGIOGRAPHY CHEST WITH CONTRAST TECHNIQUE: Multidetector CT imaging of the chest was performed using the standard protocol during bolus administration of intravenous contrast. Multiplanar CT image reconstructions and MIPs were obtained to evaluate the vascular anatomy. CONTRAST:  80 mL of Isovue 370 IV contrast COMPARISON:  CTA of the chest performed 08/11/2015, and chest radiograph performed 08/07/2015 FINDINGS: There is no evidence of pulmonary embolus. Mild patchy focal airspace opacification is noted at the right upper lobe, compatible with pneumonia. Evaluation is somewhat suboptimal due to motion artifact. There is no evidence of pleural effusion or pneumothorax. No masses are identified; no abnormal focal contrast enhancement is seen. A moderate to large hiatal hernia is noted. There is wall thickening along the distal esophagus, which may reflect chronic inflammation or possibly esophagitis. The mediastinum is otherwise unremarkable. No mediastinal lymphadenopathy is seen. No pericardial effusion is identified. The great vessels are  grossly unremarkable in appearance. No axillary lymphadenopathy is seen. The visualized portions of the thyroid gland are unremarkable in appearance. The visualized portions of the liver and spleen are unremarkable. No acute osseous abnormalities are seen. Review of the MIP images confirms the above findings. IMPRESSION: 1. No evidence of pulmonary embolus. 2. Right upper lobe pneumonia noted. 3. Moderate to large hiatal hernia seen. 4. Wall thickening along the distal esophagus, which may reflect chronic inflammation or possibly esophagitis. Electronically Signed   By: Garald Balding M.D.   On: 09/20/2015 22:32        Scheduled Meds: . amLODipine  10 mg Oral Daily  . citalopram  20 mg Oral QPM  . clindamycin (CLEOCIN) IV  600 mg Intravenous Q8H  . enoxaparin (LOVENOX) injection  40 mg Subcutaneous Q24H  . famotidine  20 mg Oral BID  . fluticasone  1 spray Each Nare Daily  . irbesartan  75 mg Oral Daily  . levofloxacin (LEVAQUIN) IV  750 mg Intravenous Q24H  . loratadine  10 mg Oral Daily  . montelukast  10 mg Oral Daily  . pantoprazole  40 mg Oral Daily  . rosuvastatin  5 mg Oral QHS   Continuous Infusions:        Prestyn Stanco, Orpah Melter, MD [facility] Pager [phone]  If 7PM-7AM, please contact night-coverage [email or web address] Password [email or web address] 09/21/2015, 2:50 PM

## 2015-09-21 NOTE — Care Management Note (Signed)
Case Management Note  Patient Details  Name: Thomas Ramos MRN: [id number] Date of Birth: 06/12/1970  Subjective/Objective:   CAP, Cerebral palsy with quadraplegia                   Action/Plan: Discharge Planning: NCm spoke to pt and caregiver, Olivia Mackie at bedside. Pt has caregivers 16 hours per day from [facility], states he will soon transition to 24 aide. He has hospital bed, wheelchair, and hoyer lift at home. Will continue to follow for dc needs.   PCP- Chesley Noon MD   Expected Discharge Date:                  Expected Discharge Plan:  Home/Self Care  In-House Referral:  NA  Discharge planning Services  CM Consult  Post Acute Care Choice:  NA Choice offered to:  NA  DME Arranged:  N/A DME Agency:  NA  HH Arranged:  NA HH Agency:  NA  Status of Service:  Completed, signed off  If discussed at [place] of Stay Meetings, dates discussed:    Additional Comments:  Erenest Rasher, RN 09/21/2015, 3:52 PM

## 2015-09-21 NOTE — Care Management Obs Status (Signed)
[place] NOTIFICATION   Patient Details  Name: Thomas Ramos MRN: [id number] Date of Birth: 01-20-1971   Medicare Observation Status Notification Given:  Yes    Erenest Rasher, RN 09/21/2015, 4:03 PM

## 2015-09-22 DIAGNOSIS — K219 Gastro-esophageal reflux disease without esophagitis: Secondary | ICD-10-CM

## 2015-09-22 LAB — CBC
HEMATOCRIT: 33.3 % — AB (ref 39.0–52.0)
HEMOGLOBIN: 11.3 g/dL — AB (ref 13.0–17.0)
MCH: 27.2 pg (ref 26.0–34.0)
MCHC: 33.9 g/dL (ref 30.0–36.0)
MCV: 80.2 fL (ref 78.0–100.0)
PLATELETS: 158 10*3/uL (ref 150–400)
RBC: 4.15 MIL/uL — AB (ref 4.22–5.81)
RDW: 13.5 % (ref 11.5–15.5)
WBC: 9.9 10*3/uL (ref 4.0–10.5)

## 2015-09-22 LAB — PROCALCITONIN: Procalcitonin: 0.24 ng/mL

## 2015-09-22 MED ORDER — LEVOFLOXACIN 750 MG PO TABS
750.0000 mg | ORAL_TABLET | ORAL | Status: DC
  Administered 2015-09-22: 750 mg via ORAL
  Filled 2015-09-22: qty 1

## 2015-09-22 MED ORDER — HYDROCODONE-ACETAMINOPHEN 5-325 MG PO TABS
1.0000 | ORAL_TABLET | Freq: Once | ORAL | Status: AC
Start: 2015-09-22 — End: 2015-09-22
  Administered 2015-09-22: 1 via ORAL
  Filled 2015-09-22: qty 1

## 2015-09-22 NOTE — Progress Notes (Signed)
PHARMACIST - PHYSICIAN COMMUNICATION DR:   Wyline Copas CONCERNING: Antibiotic IV to Oral Route Change Policy  RECOMMENDATION: This patient is receiving Levaquin by the intravenous route.  Based on criteria approved by the Pharmacy and Therapeutics Committee, the antibiotic(s) is/are being converted to the equivalent oral dose form(s).    DESCRIPTION: These criteria include:  Patient being treated for a respiratory tract infection, urinary tract infection, cellulitis or clostridium difficile associated diarrhea if on metronidazole  The patient is not neutropenic and does not exhibit a GI malabsorption state  The patient is eating (either orally or via tube) and/or has been taking other orally administered medications for a least 24 hours  The patient is improving clinically and has a Tmax < 100.5  If you have questions about this conversion, please contact the Pharmacy Department  [x]   [phone] )  [facility] PharmD, [place] Pager [phone] 09/22/2015 8:33 AM

## 2015-09-22 NOTE — Progress Notes (Signed)
PROGRESS NOTE    Thomas Ramos  [id number] DOB: 09-09-1970 DOA: 09/20/2015 PCP: Chesley Noon, MD    Brief Narrative:  45 y.o. male with medical history significant for cerebral palsy with quadriplegia, asthma, depression with anxiety, and hypertension who presents the emergency department for evaluation of swelling and erythema involving the lower left leg since 09/17/2015. Patient reports pain in his usual state of health until approximately 3 days prior to admission when he noted swelling and redness to the lower left leg.  Assessment & Plan:   Principal Problem:   Community acquired pneumonia Active Problems:   Dysphagia   HTN (hypertension)   Asthma   GERD (gastroesophageal reflux disease)   Cerebral palsy (HCC)   Depression with anxiety   Cellulitis   CAP (community acquired pneumonia)   Leg swelling  1. CAP - RUL infiltrate noted on CTA  - Saturating adequately on room air, but tachypneic  - Empiric treatment initiated with Levaquin, will continue  - Blood cultures pending, sputum culture requested  - Lactate on admission within normal limits. - Urine antigen negative for strep pneumo - Clinically improving  2. LLE swelling and erythema  - Uncertain etiology, most likely DVT vs cellulitis  - There are systemic infectious features present, but pt also with CAP  - An empiric dose of clindamycin was given in ED for suspected non-purulent cellulitis with severe penicillin allergy  - Pending lower extremity venous dopplers; if negative, continue empiric clindamycin for likely cellulitis   3. Cerebral palsy with quadraplegia  - Is status-post remote tendon release  - Continue baclofen prn   4. Hypertension  - At goal currently  - Managed with Norvasc and valsartan at home, will continue (irbesartan in place of valsartan while in hospital)   5. Hyperlipidemia  - Continue current treatment with Crestor   6. Asthma, seasonal allergy  - No  wheezing at time of admission, saturating adequately on rm air  - Albuterol neb prn SOB or wheezing  - Continue Singulair, Flonase, 2nd gen antihistamine   7. GERD - Stable  - Managed with Zantac and Prilosec at home  - Continue H2-blocker and PPI with Pepcid and Protonix while in hospital  - CTA demonstrates thickening of distal esophageal wall, possibly reflective of esophagitis  - Followed by GI in outpatient setting   8. Depression with anxiety  - Stable  - Continue current management with Buspar, Celexa    DVT prophylaxis: Lovenox subcutaneous Code Status: Full Family Communication: Patient in room, family not at bedside Disposition Plan: Possible discharge in 24hrs   Consultants:     Procedures:     Antimicrobials: Anti-infectives    Start     Dose/Rate Route Frequency Ordered Stop   09/22/15 2200  levofloxacin (LEVAQUIN) tablet 750 mg     750 mg Oral Every 24 hours 09/22/15 0843 09/25/15 2159   09/21/15 2300  levofloxacin (LEVAQUIN) IVPB 750 mg  Status:  Discontinued     750 mg 100 mL/hr over 90 Minutes Intravenous Every 24 hours 09/20/15 2343 09/22/15 0843   09/21/15 0600  clindamycin (CLEOCIN) IVPB 600 mg     600 mg 100 mL/hr over 30 Minutes Intravenous Every 8 hours 09/20/15 2343     09/20/15 2300  levofloxacin (LEVAQUIN) IVPB 750 mg     750 mg 100 mL/hr over 90 Minutes Intravenous  Once 09/20/15 2255 09/21/15 0043   09/20/15 2015  clindamycin (CLEOCIN) IVPB 600 mg     600 mg 100 mL/hr  over 30 Minutes Intravenous  Once 09/20/15 2000 09/20/15 2053          Subjective: Reports feeling better today  Objective: Filed Vitals:   09/21/15 2156 09/22/15 0457 09/22/15 1000 09/22/15 1400  BP: 119/83 121/74 112/75 135/76  Pulse: 103 86  123  Temp: 97.8 F (36.6 C) 98.2 F (36.8 C)  98.6 F (37 C)  TempSrc: Oral Oral  Oral  Resp: 20 16  20   Height:      Weight:      SpO2: 97% 97%  97%    Intake/Output Summary (Last 24 hours) at 09/22/15  1432 Last data filed at 09/22/15 1403  Gross per 24 hour  Intake   1140 ml  Output   1100 ml  Net     40 ml   Filed Weights   09/21/15 0017  Weight: 79.742 kg (175 lb 12.8 oz)    Examination:  General exam: Appears calm and comfortable, Lying in bed  Respiratory system: Clear to auscultation. Respiratory effort normal. Cardiovascular system: S1 & S2 heard, RRR.Marland Kitchen Gastrointestinal system: Abdomen is nondistended, soft and nontender. No organomegaly or masses felt. Normal bowel sounds heard. Central nervous system: Alert and oriented. Baseline for extremity weakness, with contractures Extremities: Symmetric 5 x 5 power. Skin: No rashes, lesions Psychiatry: Judgement and insight appear normal. Mood & affect appropriate.     Data Reviewed: I have personally reviewed following labs and imaging studies  CBC:  Recent Labs Lab 09/20/15 1946 09/22/15 0444  WBC 15.2* 9.9  NEUTROABS 13.4*  --   HGB 13.3 11.3*  HCT 37.6* 33.3*  MCV 78.2 80.2  PLT 179 0000000   Basic Metabolic Panel:  Recent Labs Lab 09/20/15 1946  NA 135  K 3.8  CL 103  CO2 25  GLUCOSE 173*  BUN 17  CREATININE 0.56*  CALCIUM 9.3   GFR: Estimated Creatinine Clearance: 104.2 mL/min (by C-G formula based on Cr of 0.56). Liver Function Tests: No results for input(s): AST, ALT, ALKPHOS, BILITOT, PROT, ALBUMIN in the last 168 hours. No results for input(s): LIPASE, AMYLASE in the last 168 hours. No results for input(s): AMMONIA in the last 168 hours. Coagulation Profile: No results for input(s): INR, PROTIME in the last 168 hours. Cardiac Enzymes: No results for input(s): CKTOTAL, CKMB, CKMBINDEX, TROPONINI in the last 168 hours. BNP (last 3 results) No results for input(s): PROBNP in the last 8760 hours. HbA1C: No results for input(s): HGBA1C in the last 72 hours. CBG: No results for input(s): GLUCAP in the last 168 hours. Lipid Profile: No results for input(s): CHOL, HDL, LDLCALC, TRIG, CHOLHDL,  LDLDIRECT in the last 72 hours. Thyroid Function Tests: No results for input(s): TSH, T4TOTAL, FREET4, T3FREE, THYROIDAB in the last 72 hours. Anemia Panel: No results for input(s): VITAMINB12, FOLATE, FERRITIN, TIBC, IRON, RETICCTPCT in the last 72 hours. Sepsis Labs:  Recent Labs Lab 09/20/15 2143 09/21/15 0040 09/22/15 0444  PROCALCITON  --  0.26 0.24  LATICACIDVEN 1.0  --   --     Recent Results (from the past 240 hour(s))  Culture, blood (Routine X 2) w Reflex to ID Panel     Status: None (Preliminary result)   Collection Time: 09/20/15  9:30 PM  Result Value Ref Range Status   Specimen Description BLOOD LEFT ANTECUBITAL  Final   Special Requests BOTTLES DRAWN AEROBIC AND ANAEROBIC 5CC  Final   Culture   Final    NO GROWTH < 24 HOURS Performed at [facility]    Report Status PENDING  Incomplete         Radiology Studies: Ct Angio Chest Pe W/cm &/or Wo Cm  09/20/2015  CLINICAL DATA:  Acute onset of left lower extremity swelling and dyspnea. Tachypnea and tachycardia. Initial encounter. EXAM: CT ANGIOGRAPHY CHEST WITH CONTRAST TECHNIQUE: Multidetector CT imaging of the chest was performed using the standard protocol during bolus administration of intravenous contrast. Multiplanar CT image reconstructions and MIPs were obtained to evaluate the vascular anatomy. CONTRAST:  80 mL of Isovue 370 IV contrast COMPARISON:  CTA of the chest performed 08/11/2015, and chest radiograph performed 08/07/2015 FINDINGS: There is no evidence of pulmonary embolus. Mild patchy focal airspace opacification is noted at the right upper lobe, compatible with pneumonia. Evaluation is somewhat suboptimal due to motion artifact. There is no evidence of pleural effusion or pneumothorax. No masses are identified; no abnormal focal contrast enhancement is seen. A moderate to large hiatal hernia is noted. There is wall thickening along the distal esophagus, which may reflect chronic inflammation or  possibly esophagitis. The mediastinum is otherwise unremarkable. No mediastinal lymphadenopathy is seen. No pericardial effusion is identified. The great vessels are grossly unremarkable in appearance. No axillary lymphadenopathy is seen. The visualized portions of the thyroid gland are unremarkable in appearance. The visualized portions of the liver and spleen are unremarkable. No acute osseous abnormalities are seen. Review of the MIP images confirms the above findings. IMPRESSION: 1. No evidence of pulmonary embolus. 2. Right upper lobe pneumonia noted. 3. Moderate to large hiatal hernia seen. 4. Wall thickening along the distal esophagus, which may reflect chronic inflammation or possibly esophagitis. Electronically Signed   By: Garald Balding M.D.   On: 09/20/2015 22:32        Scheduled Meds: . amLODipine  10 mg Oral Daily  . citalopram  20 mg Oral QPM  . clindamycin (CLEOCIN) IV  600 mg Intravenous Q8H  . enoxaparin (LOVENOX) injection  40 mg Subcutaneous Q24H  . famotidine  20 mg Oral BID  . fluticasone  1 spray Each Nare Daily  . irbesartan  75 mg Oral Daily  . levofloxacin  750 mg Oral Q24H  . loratadine  10 mg Oral Daily  . montelukast  10 mg Oral Daily  . pantoprazole  40 mg Oral Daily  . rosuvastatin  5 mg Oral QHS   Continuous Infusions:    LOS: 1 day     Rhilynn Preyer, Orpah Melter, MD [facility] Pager [phone]  If 7PM-7AM, please contact night-coverage [email or web address] Password [email or web address] 09/22/2015, 2:32 PM

## 2015-09-23 ENCOUNTER — Encounter (HOSPITAL_COMMUNITY): Payer: Self-pay | Admitting: Nurse Practitioner

## 2015-09-23 ENCOUNTER — Emergency Department (HOSPITAL_COMMUNITY)
Admission: EM | Admit: 2015-09-23 | Discharge: 2015-09-24 | Disposition: A | Discharge status: Home / Self Care | Payer: Medicare Other | Attending: Emergency Medicine | Admitting: Emergency Medicine

## 2015-09-23 DIAGNOSIS — Z87891 Personal history of nicotine dependence: Secondary | ICD-10-CM | POA: Insufficient documentation

## 2015-09-23 DIAGNOSIS — L03116 Cellulitis of left lower limb: Secondary | ICD-10-CM | POA: Insufficient documentation

## 2015-09-23 DIAGNOSIS — G800 Spastic quadriplegic cerebral palsy: Secondary | ICD-10-CM

## 2015-09-23 DIAGNOSIS — G809 Cerebral palsy, unspecified: Secondary | ICD-10-CM | POA: Insufficient documentation

## 2015-09-23 DIAGNOSIS — Z79899 Other long term (current) drug therapy: Secondary | ICD-10-CM | POA: Insufficient documentation

## 2015-09-23 DIAGNOSIS — I10 Essential (primary) hypertension: Secondary | ICD-10-CM | POA: Insufficient documentation

## 2015-09-23 DIAGNOSIS — Z7984 Long term (current) use of oral hypoglycemic drugs: Secondary | ICD-10-CM | POA: Insufficient documentation

## 2015-09-23 DIAGNOSIS — E119 Type 2 diabetes mellitus without complications: Secondary | ICD-10-CM | POA: Insufficient documentation

## 2015-09-23 LAB — BASIC METABOLIC PANEL
ANION GAP: 6 (ref 5–15)
BUN: 15 mg/dL (ref 6–20)
CHLORIDE: 105 mmol/L (ref 101–111)
CO2: 27 mmol/L (ref 22–32)
Calcium: 8.6 mg/dL — ABNORMAL LOW (ref 8.9–10.3)
Creatinine, Ser: 0.5 mg/dL — ABNORMAL LOW (ref 0.61–1.24)
GFR calc Af Amer: >60 mL/min (ref >60)
GFR calc non Af Amer: >60 mL/min (ref >60)
GLUCOSE: 119 mg/dL — AB (ref 65–99)
POTASSIUM: 3.4 mmol/L — AB (ref 3.5–5.1)
Sodium: 138 mmol/L (ref 135–145)

## 2015-09-23 LAB — CBC
HEMATOCRIT: 32.3 % — AB (ref 39.0–52.0)
HEMOGLOBIN: 10.7 g/dL — AB (ref 13.0–17.0)
MCH: 27.4 pg (ref 26.0–34.0)
MCHC: 33.1 g/dL (ref 30.0–36.0)
MCV: 82.6 fL (ref 78.0–100.0)
Platelets: 173 10*3/uL (ref 150–400)
RBC: 3.91 MIL/uL — AB (ref 4.22–5.81)
RDW: 13.7 % (ref 11.5–15.5)
WBC: 6.1 10*3/uL (ref 4.0–10.5)

## 2015-09-23 LAB — LEGIONELLA PNEUMOPHILA SEROGP 1 UR AG: L. pneumophila Serogp 1 Ur Ag: NEGATIVE

## 2015-09-23 MED ORDER — POTASSIUM CHLORIDE CRYS ER 20 MEQ PO TBCR
40.0000 meq | EXTENDED_RELEASE_TABLET | Freq: Once | ORAL | Status: AC
  Administered 2015-09-23: 40 meq via ORAL
  Filled 2015-09-23: qty 2

## 2015-09-23 MED ORDER — IBUPROFEN 200 MG PO TABS
400.0000 mg | ORAL_TABLET | Freq: Once | ORAL | Status: AC
  Administered 2015-09-24: 400 mg via ORAL
  Filled 2015-09-23: qty 2

## 2015-09-23 MED ORDER — SULFAMETHOXAZOLE-TRIMETHOPRIM 800-160 MG PO TABS: 1.0000 | ORAL_TABLET | Freq: Two times a day (BID) | ORAL | Status: DC

## 2015-09-23 MED ORDER — HYDROCODONE-ACETAMINOPHEN 5-325 MG PO TABS
1.0000 | ORAL_TABLET | Freq: Once | ORAL | Status: AC
  Administered 2015-09-24: 1 via ORAL
  Filled 2015-09-23: qty 1

## 2015-09-23 MED ORDER — SULFAMETHOXAZOLE-TRIMETHOPRIM 800-160 MG PO TABS
1.0000 | ORAL_TABLET | Freq: Once | ORAL | Status: AC
  Administered 2015-09-24: 1 via ORAL
  Filled 2015-09-23: qty 1

## 2015-09-23 MED ORDER — LEVOFLOXACIN 750 MG PO TABS: 750.0000 mg | ORAL_TABLET | ORAL | Status: DC

## 2015-09-23 NOTE — Progress Notes (Signed)
Patient transported home via [place]. Pt spoke with his caretaker prior to D/C and caretaker is to meet pt at his home. Patient A&O, VSS prior to D/C- denies pain & discomfort. Condom cath removed prior to D/C per pt request. All belongings sent with patient- D/C summary explained & pt verbalized understanding. Printed prescriptions also sent with pt.

## 2015-09-23 NOTE — Discharge Summary (Signed)
Physician Discharge Summary  Thomas Ramos [id number] DOB: [birth year] DOA: 09/20/2015  PCP: Chesley Noon, MD  Admit date: 09/20/2015 Discharge date: 09/23/2015  Disposition:  Home  Recommendations for Outpatient Follow-up:  1. Follow up with PCP in 1-2 weeks  Discharge Condition:Improved CODE STATUS:Full Diet recommendation: Heart Healthy   Brief/Interim Summary: 45 y.o. male with medical history significant for cerebral palsy with quadriplegia, asthma, depression with anxiety, and hypertension who presents the emergency department for evaluation of swelling and erythema involving the lower left leg since 09/17/2015. Patient reports pain in his usual state of health until approximately 3 days prior to admission when he noted swelling and redness to the lower left leg.  1. CAP - RUL infiltrate noted on CTA  - Saturating adequately on room air, but tachypneic  - Empiric treatment initiated with Levaquin, will continue  - Blood cultures pending, sputum culture requested  - Lactate on admission within normal limits. - Urine antigen negative for strep pneumo - Clinically improving - Complete course of levaquin on discharge  2. LLE swelling and erythema  - Uncertain etiology, most likely DVT vs cellulitis  - There are systemic infectious features present, but pt also with CAP  - An empiric dose of clindamycin was given in ED for suspected non-purulent cellulitis with severe penicillin allergy  - LE dopplers neg for DVT. Consider empiric bactrim to complete course as outpatient  3. Cerebral palsy with quadraplegia  - Is status-post remote tendon release  - Continued baclofen prn   4. Hypertension  - At goal currently  - Managed with Norvasc and valsartan at home, will continue (irbesartan in place of valsartan while in hospital)   5. Hyperlipidemia  - Continue current treatment with Crestor   6. Asthma, seasonal allergy  - No wheezing at time of admission,  saturating adequately on rm air  - Albuterol neb prn SOB or wheezing  - Continue Singulair, Flonase, 2nd gen antihistamine   7. GERD - Stable  - Managed with Zantac and Prilosec at home  - Continue H2-blocker and PPI with Pepcid and Protonix while in hospital  - CTA demonstrates thickening of distal esophageal wall, possibly reflective of esophagitis  - Followed by GI in outpatient setting   8. Depression with anxiety  - Stable  - Continue current management with Buspar, Celexa   Discharge Diagnoses:  Principal Problem:   Community acquired pneumonia Active Problems:   Dysphagia   HTN (hypertension)   Asthma   GERD (gastroesophageal reflux disease)   Cerebral palsy (HCC)   Depression with anxiety   Cellulitis   CAP (community acquired pneumonia)   Leg swelling       Medication List    TAKE these medications        albuterol 108 (90 Base) MCG/ACT inhaler  Commonly known as:  PROVENTIL HFA;VENTOLIN HFA  Inhale 1-2 puffs into the lungs every 6 (six) hours as needed for wheezing or shortness of breath.     amLODipine 10 MG tablet  Commonly known as:  NORVASC  Take 10 mg by mouth daily.     baclofen 10 MG tablet  Commonly known as:  LIORESAL  Take 10 mg by mouth 3 (three) times daily as needed. For neck stiffness/pain.     busPIRone 7.5 MG tablet  Commonly known as:  BUSPAR  Take 7.5 mg by mouth 2 (two) times daily as needed (for anxiousness).     cetirizine 10 MG tablet  Commonly known as:  ZYRTEC  Take  10 mg by mouth daily.     citalopram 20 MG tablet  Commonly known as:  CELEXA  Take 20 mg by mouth every evening.     CRESTOR 5 MG tablet  Generic drug:  rosuvastatin  Take 5 mg by mouth at bedtime.     fluticasone 50 MCG/ACT nasal spray  Commonly known as:  FLONASE  Place 1 spray into both nostrils daily.     levofloxacin 750 MG tablet  Commonly known as:  LEVAQUIN  Take 1 tablet (750 mg total) by mouth daily.     montelukast 10 MG tablet   Commonly known as:  SINGULAIR  Take 10 mg by mouth daily.     nitroGLYCERIN 0.4 MG SL tablet  Commonly known as:  NITROSTAT  Place 0.4 mg under the tongue every 5 (five) minutes as needed. For chest pain.     omeprazole 20 MG capsule  Commonly known as:  PRILOSEC  Take 1 capsule (20 mg total) by mouth daily.     ranitidine 150 MG tablet  Commonly known as:  ZANTAC  Take 150 mg by mouth 2 (two) times daily as needed. For heartburn.     sucralfate 1 GM/10ML suspension  Commonly known as:  CARAFATE  Take 10 mLs (1 g total) by mouth 4 (four) times daily -  with meals and at bedtime.     sulfamethoxazole-trimethoprim 800-160 MG tablet  Commonly known as:  BACTRIM DS,SEPTRA DS  Take 1 tablet by mouth 2 (two) times daily.     valsartan 80 MG tablet  Commonly known as:  DIOVAN  Take 80 mg by mouth daily.           Follow-up Information    Follow up with BADGER,MICHAEL C, MD. Schedule an appointment as soon as possible for a visit in 1 week.   Specialty:  Family Medicine   Why:  Hospital follow up   Contact information:   [address] [place] [address] [phone]      Allergies  Allergen Reactions  . Metronidazole Nausea And Vomiting  . Penicillins Hives and Nausea And Vomiting    Has patient had a PCN reaction causing immediate rash, facial/tongue/throat swelling, SOB or lightheadedness with hypotension: yes Has patient had a PCN reaction causing severe rash involving mucus membranes or skin necrosis: no Has patient had a PCN reaction that required hospitalization: no Has patient had a PCN reaction occurring within the last 10 years: no If all of the above answers are "NO", then may proceed with Cephalosporin use.      Procedures/Studies: Ct Angio Chest Pe W/cm &/or Wo Cm  09/20/2015  CLINICAL DATA:  Acute onset of left lower extremity swelling and dyspnea. Tachypnea and tachycardia. Initial encounter. EXAM: CT ANGIOGRAPHY CHEST WITH CONTRAST TECHNIQUE:  Multidetector CT imaging of the chest was performed using the standard protocol during bolus administration of intravenous contrast. Multiplanar CT image reconstructions and MIPs were obtained to evaluate the vascular anatomy. CONTRAST:  80 mL of Isovue 370 IV contrast COMPARISON:  CTA of the chest performed 08/11/2015, and chest radiograph performed 08/07/2015 FINDINGS: There is no evidence of pulmonary embolus. Mild patchy focal airspace opacification is noted at the right upper lobe, compatible with pneumonia. Evaluation is somewhat suboptimal due to motion artifact. There is no evidence of pleural effusion or pneumothorax. No masses are identified; no abnormal focal contrast enhancement is seen. A moderate to large hiatal hernia is noted. There is wall thickening along the distal esophagus,  which may reflect chronic inflammation or possibly esophagitis. The mediastinum is otherwise unremarkable. No mediastinal lymphadenopathy is seen. No pericardial effusion is identified. The great vessels are grossly unremarkable in appearance. No axillary lymphadenopathy is seen. The visualized portions of the thyroid gland are unremarkable in appearance. The visualized portions of the liver and spleen are unremarkable. No acute osseous abnormalities are seen. Review of the MIP images confirms the above findings. IMPRESSION: 1. No evidence of pulmonary embolus. 2. Right upper lobe pneumonia noted. 3. Moderate to large hiatal hernia seen. 4. Wall thickening along the distal esophagus, which may reflect chronic inflammation or possibly esophagitis. Electronically Signed   By: Garald Balding M.D.   On: 09/20/2015 22:32   Ct Cervical Spine Wo Contrast  08/26/2015  CLINICAL DATA:  Neck pain in a patient with history of cerebral palsy. EXAM: CT CERVICAL SPINE WITHOUT CONTRAST TECHNIQUE: Multidetector CT imaging of the cervical spine was performed without intravenous contrast. Multiplanar CT image reconstructions were also  generated. COMPARISON:  No comparison studies available. FINDINGS: Imaging was obtained from the skullbase through the T1-2 interspace. No evidence fracture. Loss of disc height is seen at C5-6 and C6-7 with endplate degeneration. No prevertebral soft tissue swelling. Images which include the lower brain shows marked dilatation of the lateral ventricles with overlying cortical volume loss, incompletely visualized. IMPRESSION: 1. No evidence for cervical spine fracture. 2. Marked dilatation of the lateral ventricles, incompletely visualized. This may be related to the history of cerebral palsy. If there is clinical concern for hydrocephalus, dedicated CT scan of the head is recommended. Electronically Signed   By: Misty Stanley M.D.   On: 08/26/2015 18:44     Subjective: Eager to go home  Discharge Exam: Filed Vitals:   09/22/15 2130 09/23/15 0544  BP: 130/83 110/55  Pulse: 96 80  Temp: 99.1 F (37.3 C) 98.4 F (36.9 C)  Resp: 20 20   Filed Vitals:   09/22/15 1000 09/22/15 1400 09/22/15 2130 09/23/15 0544  BP: 112/75 135/76 130/83 110/55  Pulse:  123 96 80  Temp:  98.6 F (37 C) 99.1 F (37.3 C) 98.4 F (36.9 C)  TempSrc:  Oral Oral Oral  Resp:  20 20 20   Height:      Weight:      SpO2:  97% 95% 94%    General: Pt is alert, awake, not in acute distress Cardiovascular: RRR, S1/S2  Respiratory: CTA bilaterally, no wheezing, no rhonchi Abdominal: Soft, NT, ND, bowel sounds + Extremities: chronic extremity contractures, LLE erythema improving    The results of significant diagnostics from this hospitalization (including imaging, microbiology, ancillary and laboratory) are listed below for reference.     Microbiology: Recent Results (from the past 240 hour(s))  Culture, blood (Routine X 2) w Reflex to ID Panel     Status: None (Preliminary result)   Collection Time: 09/20/15  9:30 PM  Result Value Ref Range Status   Specimen Description BLOOD LEFT ANTECUBITAL  Final    Special Requests BOTTLES DRAWN AEROBIC AND ANAEROBIC 5CC  Final   Culture   Final    NO GROWTH 2 DAYS Performed at [facility]    Report Status PENDING  Incomplete  Culture, blood (Routine X 2) w Reflex to ID Panel     Status: None (Preliminary result)   Collection Time: 09/21/15 12:39 AM  Result Value Ref Range Status   Specimen Description BLOOD LEFT HAND  Final   Special Requests BOTTLES DRAWN AEROBIC AND ANAEROBIC  5CC  Final   Culture   Final    NO GROWTH 1 DAY Performed at [facility]    Report Status PENDING  Incomplete     Labs: BNP (last 3 results)  Recent Labs  06/30/15 2322 07/08/15 0023  BNP 12.3 9.2   Basic Metabolic Panel:  Recent Labs Lab 09/20/15 1946 09/23/15 0355  NA 135 138  K 3.8 3.4*  CL 103 105  CO2 25 27  GLUCOSE 173* 119*  BUN 17 15  CREATININE 0.56* 0.50*  CALCIUM 9.3 8.6*   Liver Function Tests: No results for input(s): AST, ALT, ALKPHOS, BILITOT, PROT, ALBUMIN in the last 168 hours. No results for input(s): LIPASE, AMYLASE in the last 168 hours. No results for input(s): AMMONIA in the last 168 hours. CBC:  Recent Labs Lab 09/20/15 1946 09/22/15 0444 09/23/15 0355  WBC 15.2* 9.9 6.1  NEUTROABS 13.4*  --   --   HGB 13.3 11.3* 10.7*  HCT 37.6* 33.3* 32.3*  MCV 78.2 80.2 82.6  PLT 179 158 173   Cardiac Enzymes: No results for input(s): CKTOTAL, CKMB, CKMBINDEX, TROPONINI in the last 168 hours. BNP: Invalid input(s): POCBNP CBG: No results for input(s): GLUCAP in the last 168 hours. D-Dimer No results for input(s): DDIMER in the last 72 hours. Hgb A1c No results for input(s): HGBA1C in the last 72 hours. Lipid Profile No results for input(s): CHOL, HDL, LDLCALC, TRIG, CHOLHDL, LDLDIRECT in the last 72 hours. Thyroid function studies No results for input(s): TSH, T4TOTAL, T3FREE, THYROIDAB in the last 72 hours.  Invalid input(s): FREET3 Anemia work up No results for input(s): VITAMINB12, FOLATE, FERRITIN,  TIBC, IRON, RETICCTPCT in the last 72 hours. Urinalysis    Component Value Date/Time   COLORURINE YELLOW 07/01/2015 0143   APPEARANCEUR CLEAR 07/01/2015 0143   LABSPEC 1.031* 07/01/2015 0143   PHURINE 5.5 07/01/2015 0143   GLUCOSEU NEGATIVE 07/01/2015 0143   HGBUR NEGATIVE 07/01/2015 0143   BILIRUBINUR SMALL* 07/01/2015 0143   KETONESUR NEGATIVE 07/01/2015 0143   PROTEINUR NEGATIVE 07/01/2015 0143   UROBILINOGEN 1.0 05/26/2014 2211   NITRITE NEGATIVE 07/01/2015 0143   LEUKOCYTESUR NEGATIVE 07/01/2015 0143   Sepsis Labs Invalid input(s): PROCALCITONIN,  WBC,  LACTICIDVEN Microbiology Recent Results (from the past 240 hour(s))  Culture, blood (Routine X 2) w Reflex to ID Panel     Status: None (Preliminary result)   Collection Time: 09/20/15  9:30 PM  Result Value Ref Range Status   Specimen Description BLOOD LEFT ANTECUBITAL  Final   Special Requests BOTTLES DRAWN AEROBIC AND ANAEROBIC 5CC  Final   Culture   Final    NO GROWTH 2 DAYS Performed at [facility]    Report Status PENDING  Incomplete  Culture, blood (Routine X 2) w Reflex to ID Panel     Status: None (Preliminary result)   Collection Time: 09/21/15 12:39 AM  Result Value Ref Range Status   Specimen Description BLOOD LEFT HAND  Final   Special Requests BOTTLES DRAWN AEROBIC AND ANAEROBIC 5CC  Final   Culture   Final    NO GROWTH 1 DAY Performed at [facility]    Report Status PENDING  Incomplete     SIGNED:   Donne Hazel, MD  [facility] 09/23/2015, 11:33 AM  If 7PM-7AM, please contact night-coverage [email or web address] Password [email or web address]

## 2015-09-23 NOTE — ED Notes (Signed)
Bed: WA20 Expected date:  Expected time:  Means of arrival:  Comments: EMS quad; leg swelling / redness

## 2015-09-23 NOTE — ED Provider Notes (Signed)
CSN: [id number]     Arrival date & time 09/23/15  2214 History  By signing my name below, I, Hansel Feinstein, attest that this documentation has been prepared under the direction and in the presence of Delora Fuel, MD. Electronically Signed: Hansel Feinstein, ED Scribe. 09/23/2015. 11:34 PM.     Chief Complaint  Patient presents with  . Foot Pain   The history is provided by the patient and medical records. No language interpreter was used.    HPI Comments: Thomas Ramos is a 45 y.o. male with h/o Cerebral Palsy, Quadriplegic spinal paralysis who presents to the Emergency Department complaining of moderate, persistent 10/10 left foot pain onset 6 days ago with associated redness and swelling. Pt denies taking OTC medications at home to improve symptoms. Pt was just discharged from the hospital today after treatment for PNA. Pt was given an empiric dose of clindamycin for suspected cellulitis while admitted and discharged with a prescription for Bactrim. He states he has not yet gotten his prescriptions filled since being discharged and intends to fill them tomorrow. He reports that his redness and swelling has worsened since he was admitted.   Past Medical History  Diagnosis Date  . Motility disorder, esophageal   . Esophageal stricture   . Hypertension   . Pneumonia   . GERD (gastroesophageal reflux disease)   . CP (cerebral palsy) ([place])   . S/P Botox injection     approx every 4 months  . Seasonal allergies   . Environmental allergies     takes inhalers if needed  . 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[place])   . Cerebral palsy ([place])   . 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 Anxiety    Past Surgical History  Procedure Laterality Date  . Tendon release    . Mouth surgery    . Esophagus surgery      stretched esophagus  . Legs    . Esophagogastroduodenoscopy N/A 12/21/2012    Procedure: ESOPHAGOGASTRODUODENOSCOPY (EGD);  Surgeon: Inda Castle, MD;  Location: [facility];  Service: Endoscopy;  Laterality: N/A;  . Botox injection N/A 12/21/2012    Procedure: BOTOX INJECTION;  Surgeon: Inda Castle, MD;  Location: [facility] END[facility]COPY;  Service: Endoscopy;  Laterality: N/A;  . Esophagogastroduodenoscopy N/A 06/28/2014    Procedure: ESOPHAGOGASTRODUODENOSCOPY (EGD);  Surgeon: Inda Castle, MD;  Location: [facility];  Service: Endoscopy;  Laterality: N/A;  . Botox injection N/A 06/28/2014    Procedure: BOTOX INJECTION;  Surgeon: Inda Castle, MD;  Location: [facility];  Service: Endoscopy;  Laterality: N/A;   Family History  Problem Relation Age of Onset  . Hypertension Father   . Lung cancer Father   . Diabetes Mother    Social History  Substance Use Topics  . Smoking status: Former Research scientist (life sciences)  . Smokeless tobacco: Former Systems developer     Comment: smoked one time  . Alcohol Use: 0.0 oz/week     Comment: 2 beers every other Friday and Saturday    Review of Systems  Cardiovascular: Positive for leg swelling (LLE).  Musculoskeletal: Positive for arthralgias (LLE).  Skin: Positive for color change (redness).  All other systems reviewed and are negative.  Allergies  Metronidazole and Penicillins  Home Medications   Prior to Admission medications   Medication Sig Start Date End Date Taking? Authorizing Provider  albuterol (PROVENTIL HFA;VENTOLIN HFA) 108 (90 Base) MCG/ACT inhaler Inhale 1-2 puffs into the lungs every 6 (six) hours as needed  for wheezing or shortness of breath. 08/26/15  Yes Ripley Fraise, MD  amLODipine (NORVASC) 10 MG tablet Take 10 mg by mouth daily.   Yes Historical Provider, MD  baclofen (LIORESAL) 10 MG tablet Take 10 mg by mouth 3 (three) times daily as needed. For neck stiffness/pain. 08/30/15  Yes Historical Provider, MD  busPIRone (BUSPAR) 7.5 MG tablet Take 7.5 mg by mouth 2 (two) times daily as needed (for anxiousness).    Yes Historical Provider, MD  cetirizine (ZYRTEC) 10 MG tablet Take 10 mg by mouth daily.  03/01/14  Yes  Historical Provider, MD  citalopram (CELEXA) 20 MG tablet Take 20 mg by mouth every evening.   Yes Historical Provider, MD  CRESTOR 5 MG tablet Take 5 mg by mouth at bedtime. 04/22/14  Yes Historical Provider, MD  fluticasone (FLONASE) 50 MCG/ACT nasal spray Place 1 spray into both nostrils daily.  03/01/14  Yes Historical Provider, MD  montelukast (SINGULAIR) 10 MG tablet Take 10 mg by mouth daily.   Yes Historical Provider, MD  nitroGLYCERIN (NITROSTAT) 0.4 MG SL tablet Place 0.4 mg under the tongue every 5 (five) minutes as needed. For chest pain. 08/26/15  Yes Historical Provider, MD  omeprazole (PRILOSEC) 20 MG capsule Take 1 capsule (20 mg total) by mouth daily. 08/11/15  Yes April Palumbo, MD  ranitidine (ZANTAC) 150 MG tablet Take 150 mg by mouth 2 (two) times daily as needed. For heartburn. 08/26/15  Yes Historical Provider, MD  valsartan (DIOVAN) 80 MG tablet Take 80 mg by mouth daily. 08/26/15  Yes Historical Provider, MD  levofloxacin (LEVAQUIN) 750 MG tablet Take 1 tablet (750 mg total) by mouth daily. 09/23/15   Donne Hazel, MD  sucralfate (CARAFATE) 1 GM/10ML suspension Take 10 mLs (1 g total) by mouth 4 (four) times daily -  with meals and at bedtime. Patient not taking: Reported on 09/20/2015 08/11/15   April Palumbo, MD  sulfamethoxazole-trimethoprim (BACTRIM DS,SEPTRA DS) 800-160 MG tablet Take 1 tablet by mouth 2 (two) times daily. 09/23/15   Donne Hazel, MD   BP 129/81 mmHg  Pulse 119  Temp(Src) 98.3 F (36.8 C) (Oral)  Resp 18  SpO2 94% Physical Exam  Constitutional: He is oriented to person, place, and time. He appears well-developed and well-nourished.  HENT:  Head: Normocephalic and atraumatic.  Eyes: Conjunctivae and EOM are normal. Pupils are equal, round, and reactive to light.  Neck: No JVD present.  Cardiovascular: Normal rate, regular rhythm and normal heart sounds.   No murmur heard. Pulmonary/Chest: Effort normal and breath sounds normal. He has no wheezes. He  has no rales. He exhibits no tenderness.  Abdominal: Soft. Bowel sounds are normal. He exhibits no distension and no mass. There is no tenderness.  Musculoskeletal: Normal range of motion. He exhibits edema.  Left leg has erythema over the mid and lower calf extending to the foot. Left leg swollen compared with the right. Warm to the touch. No proximal swelling or tenderness. Flexion contracture of the right arm.    Lymphadenopathy:    He has no cervical adenopathy.  Neurological: He is alert and oriented to person, place, and time.  Dysarthric speech. Spastic quadriparesis.   Skin: Skin is warm and dry. No rash noted. There is erythema.  Psychiatric: He has a normal mood and affect. His behavior is normal. Thought content normal.  Nursing note and vitals reviewed.   ED Course  Procedures (including critical care time) DIAGNOSTIC STUDIES: Oxygen Saturation is 94% on RA,  adequate by my interpretation.    COORDINATION OF CARE: 11:29 PM Discussed treatment plan with pt at bedside which includes lab work, pain management and pt agreed to plan.   Labs Review Results for orders placed or performed during the hospital encounter of 09/23/15  CBC with Differential  Result Value Ref Range   WBC 8.4 4.0 - 10.5 K/uL   RBC 4.42 4.22 - 5.81 MIL/uL   Hemoglobin 11.9 (L) 13.0 - 17.0 g/dL   HCT 35.9 (L) 39.0 - 52.0 %   MCV 81.2 78.0 - 100.0 fL   MCH 26.9 26.0 - 34.0 pg   MCHC 33.1 30.0 - 36.0 g/dL   RDW 13.2 11.5 - 15.5 %   Platelets 212 150 - 400 K/uL   Neutrophils Relative % 73 %   Neutro Abs 6.2 1.7 - 7.7 K/uL   Lymphocytes Relative 16 %   Lymphs Abs 1.3 0.7 - 4.0 K/uL   Monocytes Relative 10 %   Monocytes Absolute 0.8 0.1 - 1.0 K/uL   Eosinophils Relative 1 %   Eosinophils Absolute 0.0 0.0 - 0.7 K/uL   Basophils Relative 0 %   Basophils Absolute 0.0 0.0 - 0.1 K/uL  Basic metabolic panel  Result Value Ref Range   Sodium 137 135 - 145 mmol/L   Potassium 3.7 3.5 - 5.1 mmol/L    Chloride 104 101 - 111 mmol/L   CO2 25 22 - 32 mmol/L   Glucose, Bld 125 (H) 65 - 99 mg/dL   BUN 15 6 - 20 mg/dL   Creatinine, Ser 0.53 (L) 0.61 - 1.24 mg/dL   Calcium 8.9 8.9 - 10.3 mg/dL   GFR calc non Af Amer >60 >60 mL/min   GFR calc Af Amer >60 >60 mL/min   Anion gap 8 5 - 15  Sedimentation rate  Result Value Ref Range   Sed Rate 62 (H) 0 - 16 mm/hr   I have personally reviewed and evaluated these images and lab results as part of my medical decision-making.   MDM   Final diagnoses:  Cellulitis of left lower leg  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HCC)    Cellulitis of the left leg. Old records are reviewed and he was just discharged from the hospital where he was found to have community-acquired pneumonia. There was also noted pain and swelling in the legs but had been negative venous duplex scans. He was discharged with prescriptions for levofloxacin and trimethoprim-sulfamethoxazole, but did not fill those prescriptions. On review of the MAR,  He did receive his dose of levofloxacin for today. He is given a dose of trimethoprim-sulfamethoxazole. Screening labs are obtained and WBC is noted to be normal without left shift. Sedimentation rate is moderately elevated at 62. He does not appear toxic and appears safe to go home at this point. He states that he can get his prescriptions filled tomorrow and is encouraged to do so. His main complaint now is pain in his foot. He would have been given a dose of hydrocodone-acetaminophen with good relief of pain. He is discharged with prescription for that. He is to continue taking levofloxacin and trimethoprim-sulfamethoxazole. Follow-up with PCP in 2 days.  I personally performed the services described in this documentation, which was scribed in my presence. The recorded information has been reviewed and is accurate.      Delora Fuel, MD [id number]

## 2015-09-23 NOTE — ED Notes (Signed)
Pt c/o bilateral foot ongoing pain, discharge from hospitalization today, requesting pain control.

## 2015-09-23 NOTE — Progress Notes (Signed)
Spoke with pt concerning transportation home. Pt will need PTAR to transported home. Pt will call [facility]iver when leaving the hospital.

## 2015-09-23 NOTE — Progress Notes (Signed)
Patient noted to have 12 ED visits in 6 months. Patient with Medicare and Medicaid insurance. Patient recently admitted and discharged from 07/07 to 07/10 In patient's discharge summary: Discharge Condition:Improved  2. LLE swelling and erythema  - Uncertain etiology, most likely DVT vs cellulitis  - There are systemic infectious features present, but pt also with CAP  - An empiric dose of clindamycin was given in ED for suspected non-purulent cellulitis with severe penicillin allergy  - LE dopplers neg for DVT. Consider empiric bactrim to complete course as outpatient  Per unit CM: 09/21/15 NCm spoke to pt and caregiver, Olivia Mackie at bedside. Pt has caregivers 16 hours per day from [facility], states he will soon transition to 24 aide. He has hospital bed, wheelchair, and hoyer lift at home.Marland Kitchen

## 2015-09-24 LAB — CBC WITH DIFFERENTIAL/PLATELET
Basophils Absolute: 0 10*3/uL (ref 0.0–0.1)
Basophils Relative: 0 %
Eosinophils Absolute: 0 10*3/uL (ref 0.0–0.7)
Eosinophils Relative: 1 %
HCT: 35.9 % — ABNORMAL LOW (ref 39.0–52.0)
Hemoglobin: 11.9 g/dL — ABNORMAL LOW (ref 13.0–17.0)
Lymphocytes Relative: 16 %
Lymphs Abs: 1.3 10*3/uL (ref 0.7–4.0)
MCH: 26.9 pg (ref 26.0–34.0)
MCHC: 33.1 g/dL (ref 30.0–36.0)
MCV: 81.2 fL (ref 78.0–100.0)
Monocytes Absolute: 0.8 10*3/uL (ref 0.1–1.0)
Monocytes Relative: 10 %
Neutro Abs: 6.2 10*3/uL (ref 1.7–7.7)
Neutrophils Relative %: 73 %
Platelets: 212 10*3/uL (ref 150–400)
RBC: 4.42 MIL/uL (ref 4.22–5.81)
RDW: 13.2 % (ref 11.5–15.5)
WBC: 8.4 10*3/uL (ref 4.0–10.5)

## 2015-09-24 LAB — BASIC METABOLIC PANEL
Anion gap: 8 (ref 5–15)
BUN: 15 mg/dL (ref 6–20)
CO2: 25 mmol/L (ref 22–32)
Calcium: 8.9 mg/dL (ref 8.9–10.3)
Chloride: 104 mmol/L (ref 101–111)
Creatinine, Ser: 0.53 mg/dL — ABNORMAL LOW (ref 0.61–1.24)
GFR calc Af Amer: >60 mL/min (ref >60)
GLUCOSE: 125 mg/dL — AB (ref 65–99)
POTASSIUM: 3.7 mmol/L (ref 3.5–5.1)
Sodium: 137 mmol/L (ref 135–145)

## 2015-09-24 LAB — SEDIMENTATION RATE: Sed Rate: 62 mm/h — ABNORMAL HIGH (ref 0–16)

## 2015-09-24 MED ORDER — HYDROCODONE-ACETAMINOPHEN 5-325 MG PO TABS: 1.0000 | ORAL_TABLET | ORAL | Status: DC | PRN

## 2015-09-24 NOTE — Discharge Instructions (Signed)
Make sure to get your prescriptions filled so that she cannot miss any doses of your antibiotics.   Cellulitis Cellulitis is an infection of the skin and the tissue beneath it. The infected area is usually red and tender. Cellulitis occurs most often in the arms and lower legs.  CAUSES  Cellulitis is caused by bacteria that enter the skin through cracks or cuts in the skin. The most common types of bacteria that cause cellulitis are staphylococci and streptococci. SIGNS AND SYMPTOMS   Redness and warmth.  Swelling.  Tenderness or pain.  Fever. DIAGNOSIS  Your health care provider can usually determine what is wrong based on a physical exam. Blood tests may also be done. TREATMENT  Treatment usually involves taking an antibiotic medicine. HOME CARE INSTRUCTIONS   Take your antibiotic medicine as directed by your health care provider. Finish the antibiotic even if you start to feel better.  Keep the infected arm or leg elevated to reduce swelling.  Apply a warm cloth to the affected area up to 4 times per day to relieve pain.  Take medicines only as directed by your health care provider.  Keep all follow-up visits as directed by your health care provider. SEEK MEDICAL CARE IF:   You notice red streaks coming from the infected area.  Your red area gets larger or turns dark in color.  Your bone or joint underneath the infected area becomes painful after the skin has healed.  Your infection returns in the same area or another area.  You notice a swollen bump in the infected area.  You develop new symptoms.  You have a fever. SEEK IMMEDIATE MEDICAL CARE IF:   You feel very sleepy.  You develop vomiting or diarrhea.  You have a general ill feeling (malaise) with muscle aches and pains.   This information is not intended to replace advice given to you by your health care provider. Make sure you discuss any questions you have with your health care provider.   Document  Released: 12/10/2004 Document Revised: 11/21/2014 Document Reviewed: 05/18/2011 Elsevier Interactive Patient Education 2016 Else[employer]  Acetaminophen; Hydrocodone tablets or capsules What is this medicine? ACETAMINOPHEN; HYDROCODONE (a set a MEE noe fen; hye droe KOE done) is a pain reliever. It is used to treat moderate to severe pain. This medicine may be used for other purposes; ask your health care provider or pharmacist if you have questions. What should I tell my health care provider before I take this medicine? They need to know if you have any of these conditions: -brain tumor -Crohn's disease, inflammatory bowel disease, or ulcerative colitis -drug abuse or addiction -head injury -heart or circulation problems -if you often drink alcohol -kidney disease or problems going to the bathroom -liver disease -lung disease, asthma, or breathing problems -an unusual or allergic reaction to acetaminophen, hydrocodone, other opioid analgesics, other medicines, foods, dyes, or preservatives -pregnant or trying to get pregnant -breast-feeding How should I use this medicine? Take this medicine by mouth. Swallow it with a full glass of water. Follow the directions on the prescription label. If the medicine upsets your stomach, take the medicine with food or milk. Do not take more than you are told to take. Talk to your pediatrician regarding the use of this medicine in children. This medicine is not approved for use in children. Patients over 65 years may have a stronger reaction and need a smaller dose. Overdosage: If you think you have taken too much of this medicine  contact a poison control center or emergency room at once. NOTE: This medicine is only for you. Do not share this medicine with others. What if I miss a dose? If you miss a dose, take it as soon as you can. If it is almost time for your next dose, take only that dose. Do not take double or extra doses. What may interact with  this medicine? -alcohol -antihistamines -isoniazid -medicines for depression, anxiety, or psychotic disturbances -medicines for sleep -muscle relaxants -naltrexone -narcotic medicines (opiates) for pain -phenobarbital -ritonavir -tramadol This list may not describe all possible interactions. Give your health care provider a list of all the medicines, herbs, non-prescription drugs, or dietary supplements you use. Also tell them if you smoke, drink alcohol, or use illegal drugs. Some items may interact with your medicine. What should I watch for while using this medicine? Tell your doctor or health care professional if your pain does not go away, if it gets worse, or if you have new or a different type of pain. You may develop tolerance to the medicine. Tolerance means that you will need a higher dose of the medicine for pain relief. Tolerance is normal and is expected if you take the medicine for a long time. Do not suddenly stop taking your medicine because you may develop a severe reaction. Your body becomes used to the medicine. This does NOT mean you are addicted. Addiction is a behavior related to getting and using a drug for a non-medical reason. If you have pain, you have a medical reason to take pain medicine. Your doctor will tell you how much medicine to take. If your doctor wants you to stop the medicine, the dose will be slowly lowered over time to avoid any side effects. You may get drowsy or dizzy when you first start taking the medicine or change doses. Do not drive, use machinery, or do anything that may be dangerous until you know how the medicine affects you. Stand or sit up slowly. There are different types of narcotic medicines (opiates) for pain. If you take more than one type at the same time, you may have more side effects. Give your health care provider a list of all medicines you use. Your doctor will tell you how much medicine to take. Do not take more medicine than  directed. Call emergency for help if you have problems breathing. The medicine will cause constipation. Try to have a bowel movement at least every 2 to 3 days. If you do not have a bowel movement for 3 days, call your doctor or health care professional. Too much acetaminophen can be very dangerous. Do not take Tylenol (acetaminophen) or medicines that contain acetaminophen with this medicine. Many non-prescription medicines contain acetaminophen. Always read the labels carefully. What side effects may I notice from receiving this medicine? Side effects that you should report to your doctor or health care professional as soon as possible: -allergic reactions like skin rash, itching or hives, swelling of the face, lips, or tongue -breathing problems -confusion -feeling faint or lightheaded, falls -stomach pain -yellowing of the eyes or skin Side effects that usually do not require medical attention (report to your doctor or health care professional if they continue or are bothersome): -nausea, vomiting -stomach upset This list may not describe all possible side effects. Call your doctor for medical advice about side effects. You may report side effects to FDA at 1-800-FDA-1088. Where should I keep my medicine? Keep out of the reach of children.  This medicine can be abused. Keep your medicine in a safe place to protect it from theft. Do not share this medicine with anyone. Selling or giving away this medicine is dangerous and against the law. This medicine may cause accidental overdose and death if it taken by other adults, children, or pets. Mix any unused medicine with a substance like cat litter or coffee grounds. Then throw the medicine away in a sealed container like a sealed bag or a coffee can with a lid. Do not use the medicine after the expiration date. Store at room temperature between 15 and 30 degrees C (59 and 86 degrees F). NOTE: This sheet is a summary. It may not cover all possible  information. If you have questions about this medicine, talk to your doctor, pharmacist, or health care provider.    2016, Elsevier/Gold Standard. (2014-01-31 15:29:20)

## 2015-09-25 ENCOUNTER — Encounter (HOSPITAL_BASED_OUTPATIENT_CLINIC_OR_DEPARTMENT_OTHER): Payer: Medicare Other

## 2015-09-25 ENCOUNTER — Institutional Professional Consult (permissible substitution): Payer: Medicare Other | Admitting: Pulmonary Disease

## 2015-09-25 LAB — CULTURE, BLOOD (ROUTINE X 2): Culture: NO GROWTH

## 2015-09-26 LAB — CULTURE, BLOOD (ROUTINE X 2): CULTURE: NO GROWTH

## 2015-10-01 ENCOUNTER — Ambulatory Visit: Payer: Medicare Other | Admitting: Gastroenterology

## 2015-10-01 ENCOUNTER — Ambulatory Visit: Payer: Medicare Other | Admitting: Physical Therapy

## 2015-10-01 ENCOUNTER — Emergency Department (HOSPITAL_COMMUNITY): Payer: Medicare Other

## 2015-10-01 ENCOUNTER — Inpatient Hospital Stay (HOSPITAL_COMMUNITY)
Admission: EM | Admit: 2015-10-01 | Discharge: 2015-10-05 | DRG: 602 | Disposition: A | Discharge status: Home / Self Care | Payer: Medicare Other | Attending: Internal Medicine | Admitting: Internal Medicine

## 2015-10-01 ENCOUNTER — Encounter (HOSPITAL_COMMUNITY): Payer: Self-pay | Admitting: Emergency Medicine

## 2015-10-01 DIAGNOSIS — Z801 Family history of malignant neoplasm of trachea, bronchus and lung: Secondary | ICD-10-CM

## 2015-10-01 DIAGNOSIS — G809 Cerebral palsy, unspecified: Secondary | ICD-10-CM | POA: Diagnosis present

## 2015-10-01 DIAGNOSIS — Z888 Allergy status to other drugs, medicaments and biological substances status: Secondary | ICD-10-CM

## 2015-10-01 DIAGNOSIS — Z9114 Patient's other noncompliance with medication regimen: Secondary | ICD-10-CM

## 2015-10-01 DIAGNOSIS — J45909 Unspecified asthma, uncomplicated: Secondary | ICD-10-CM | POA: Diagnosis present

## 2015-10-01 DIAGNOSIS — Z881 Allergy status to other antibiotic agents status: Secondary | ICD-10-CM

## 2015-10-01 DIAGNOSIS — R1310 Dysphagia, unspecified: Secondary | ICD-10-CM

## 2015-10-01 DIAGNOSIS — Z87891 Personal history of nicotine dependence: Secondary | ICD-10-CM

## 2015-10-01 DIAGNOSIS — Z8701 Personal history of pneumonia (recurrent): Secondary | ICD-10-CM

## 2015-10-01 DIAGNOSIS — J45901 Unspecified asthma with (acute) exacerbation: Secondary | ICD-10-CM | POA: Diagnosis present

## 2015-10-01 DIAGNOSIS — Z880 Allergy status to penicillin: Secondary | ICD-10-CM

## 2015-10-01 DIAGNOSIS — Z8249 Family history of ischemic heart disease and other diseases of the circulatory system: Secondary | ICD-10-CM

## 2015-10-01 DIAGNOSIS — E785 Hyperlipidemia, unspecified: Secondary | ICD-10-CM | POA: Diagnosis present

## 2015-10-01 DIAGNOSIS — Z7951 Long term (current) use of inhaled steroids: Secondary | ICD-10-CM

## 2015-10-01 DIAGNOSIS — Z792 Long term (current) use of antibiotics: Secondary | ICD-10-CM

## 2015-10-01 DIAGNOSIS — J189 Pneumonia, unspecified organism: Secondary | ICD-10-CM | POA: Diagnosis not present

## 2015-10-01 DIAGNOSIS — E119 Type 2 diabetes mellitus without complications: Secondary | ICD-10-CM | POA: Diagnosis present

## 2015-10-01 DIAGNOSIS — L03116 Cellulitis of left lower limb: Principal | ICD-10-CM | POA: Diagnosis present

## 2015-10-01 DIAGNOSIS — Z79899 Other long term (current) drug therapy: Secondary | ICD-10-CM

## 2015-10-01 DIAGNOSIS — Z833 Family history of diabetes mellitus: Secondary | ICD-10-CM

## 2015-10-01 DIAGNOSIS — I5032 Chronic diastolic (congestive) heart failure: Secondary | ICD-10-CM | POA: Diagnosis present

## 2015-10-01 DIAGNOSIS — M7989 Other specified soft tissue disorders: Secondary | ICD-10-CM

## 2015-10-01 DIAGNOSIS — F418 Other specified anxiety disorders: Secondary | ICD-10-CM | POA: Diagnosis present

## 2015-10-01 DIAGNOSIS — I110 Hypertensive heart disease with heart failure: Secondary | ICD-10-CM | POA: Diagnosis present

## 2015-10-01 DIAGNOSIS — I10 Essential (primary) hypertension: Secondary | ICD-10-CM | POA: Diagnosis present

## 2015-10-01 DIAGNOSIS — K222 Esophageal obstruction: Secondary | ICD-10-CM | POA: Diagnosis present

## 2015-10-01 DIAGNOSIS — K219 Gastro-esophageal reflux disease without esophagitis: Secondary | ICD-10-CM | POA: Diagnosis present

## 2015-10-01 HISTORY — DX: Chronic diastolic (congestive) heart failure: I50.32

## 2015-10-01 LAB — URINALYSIS, ROUTINE W REFLEX MICROSCOPIC
Bilirubin Urine: NEGATIVE
GLUCOSE, UA: NEGATIVE mg/dL
HGB URINE DIPSTICK: NEGATIVE
Ketones, ur: NEGATIVE mg/dL
Leukocytes, UA: NEGATIVE
Nitrite: NEGATIVE
PROTEIN: NEGATIVE mg/dL
Specific Gravity, Urine: 1.025 (ref 1.005–1.030)
pH: 6 (ref 5.0–8.0)

## 2015-10-01 LAB — CBC WITH DIFFERENTIAL/PLATELET
Basophils Absolute: 0 10*3/uL (ref 0.0–0.1)
Basophils Relative: 0 %
EOS ABS: 0.1 10*3/uL (ref 0.0–0.7)
Eosinophils Relative: 2 %
HCT: 37.7 % — ABNORMAL LOW (ref 39.0–52.0)
Hemoglobin: 12.5 g/dL — ABNORMAL LOW (ref 13.0–17.0)
LYMPHS ABS: 2.3 10*3/uL (ref 0.7–4.0)
Lymphocytes Relative: 31 %
MCH: 27.3 pg (ref 26.0–34.0)
MCHC: 33.2 g/dL (ref 30.0–36.0)
MCV: 82.3 fL (ref 78.0–100.0)
MONOS PCT: 6 %
Monocytes Absolute: 0.4 10*3/uL (ref 0.1–1.0)
Neutro Abs: 4.6 10*3/uL (ref 1.7–7.7)
Neutrophils Relative %: 61 %
Platelets: 273 10*3/uL (ref 150–400)
RBC: 4.58 MIL/uL (ref 4.22–5.81)
RDW: 13.2 % (ref 11.5–15.5)
WBC: 7.5 10*3/uL (ref 4.0–10.5)

## 2015-10-01 LAB — BASIC METABOLIC PANEL
Anion gap: 8 (ref 5–15)
BUN: 15 mg/dL (ref 6–20)
CHLORIDE: 103 mmol/L (ref 101–111)
CO2: 26 mmol/L (ref 22–32)
CREATININE: 0.47 mg/dL — AB (ref 0.61–1.24)
Calcium: 8.9 mg/dL (ref 8.9–10.3)
GFR calc Af Amer: >60 mL/min (ref >60)
Glucose, Bld: 111 mg/dL — ABNORMAL HIGH (ref 65–99)
Potassium: 3.8 mmol/L (ref 3.5–5.1)
SODIUM: 137 mmol/L (ref 135–145)

## 2015-10-01 LAB — SEDIMENTATION RATE: Sed Rate: 26 mm/hr — ABNORMAL HIGH (ref 0–16)

## 2015-10-01 LAB — C-REACTIVE PROTEIN: CRP: 1.5 mg/dL — ABNORMAL HIGH (ref <1.0)

## 2015-10-01 LAB — BRAIN NATRIURETIC PEPTIDE: B NATRIURETIC PEPTIDE 5: 34.5 pg/mL (ref 0.0–100.0)

## 2015-10-01 LAB — I-STAT CG4 LACTIC ACID, ED: LACTIC ACID, VENOUS: 0.93 mmol/L (ref 0.5–1.9)

## 2015-10-01 MED ORDER — ROSUVASTATIN CALCIUM 5 MG PO TABS
5.0000 mg | ORAL_TABLET | Freq: Every day | ORAL | Status: DC
  Administered 2015-10-01 – 2015-10-04 (×4): 5 mg via ORAL
  Filled 2015-10-01 (×4): qty 1

## 2015-10-01 MED ORDER — AMLODIPINE BESYLATE 10 MG PO TABS
10.0000 mg | ORAL_TABLET | Freq: Every day | ORAL | Status: DC
  Administered 2015-10-02 – 2015-10-05 (×4): 10 mg via ORAL
  Filled 2015-10-01 (×4): qty 1

## 2015-10-01 MED ORDER — BACLOFEN 10 MG PO TABS: 10.0000 mg | ORAL_TABLET | Freq: Three times a day (TID) | ORAL | Status: DC | PRN

## 2015-10-01 MED ORDER — HYDROCODONE-ACETAMINOPHEN 5-325 MG PO TABS
1.0000 | ORAL_TABLET | ORAL | Status: DC | PRN
  Administered 2015-10-01: 1 via ORAL
  Filled 2015-10-01: qty 1

## 2015-10-01 MED ORDER — FAMOTIDINE 20 MG PO TABS
20.0000 mg | ORAL_TABLET | Freq: Two times a day (BID) | ORAL | Status: DC
  Administered 2015-10-01 – 2015-10-05 (×8): 20 mg via ORAL
  Filled 2015-10-01 (×8): qty 1

## 2015-10-01 MED ORDER — ACETAMINOPHEN 650 MG RE SUPP: 650.0000 mg | Freq: Four times a day (QID) | RECTAL | Status: DC | PRN

## 2015-10-01 MED ORDER — ONDANSETRON HCL 4 MG PO TABS: 4.0000 mg | ORAL_TABLET | Freq: Four times a day (QID) | ORAL | Status: DC | PRN

## 2015-10-01 MED ORDER — ALBUTEROL SULFATE (2.5 MG/3ML) 0.083% IN NEBU: 2.5000 mg | INHALATION_SOLUTION | RESPIRATORY_TRACT | Status: DC | PRN

## 2015-10-01 MED ORDER — ENOXAPARIN SODIUM 40 MG/0.4ML ~~LOC~~ SOLN
40.0000 mg | SUBCUTANEOUS | Status: DC
  Administered 2015-10-01 – 2015-10-04 (×4): 40 mg via SUBCUTANEOUS
  Filled 2015-10-01 (×4): qty 0.4

## 2015-10-01 MED ORDER — IRBESARTAN 75 MG PO TABS
75.0000 mg | ORAL_TABLET | Freq: Every day | ORAL | Status: DC
  Administered 2015-10-02 – 2015-10-05 (×4): 75 mg via ORAL
  Filled 2015-10-01 (×4): qty 1

## 2015-10-01 MED ORDER — SULFAMETHOXAZOLE-TRIMETHOPRIM 800-160 MG PO TABS
2.0000 | ORAL_TABLET | Freq: Two times a day (BID) | ORAL | Status: DC
  Administered 2015-10-01: 2 via ORAL
  Filled 2015-10-01 (×2): qty 2

## 2015-10-01 MED ORDER — FLUTICASONE PROPIONATE 50 MCG/ACT NA SUSP
1.0000 | Freq: Every day | NASAL | Status: DC
Start: 2015-10-02 — End: 2015-10-05
  Administered 2015-10-02 – 2015-10-03 (×2): 1 via NASAL
  Filled 2015-10-01: qty 16

## 2015-10-01 MED ORDER — LEVOFLOXACIN IN D5W 500 MG/100ML IV SOLN: 500.0000 mg | INTRAVENOUS | Status: DC

## 2015-10-01 MED ORDER — SODIUM CHLORIDE 0.9 % IV BOLUS (SEPSIS)
1000.0000 mL | Freq: Once | INTRAVENOUS | Status: AC
  Administered 2015-10-01: 1000 mL via INTRAVENOUS

## 2015-10-01 MED ORDER — ONDANSETRON HCL 4 MG/2ML IJ SOLN: 4.0000 mg | Freq: Four times a day (QID) | INTRAMUSCULAR | Status: DC | PRN

## 2015-10-01 MED ORDER — CITALOPRAM HYDROBROMIDE 20 MG PO TABS
20.0000 mg | ORAL_TABLET | Freq: Every evening | ORAL | Status: DC
  Administered 2015-10-01 – 2015-10-04 (×4): 20 mg via ORAL
  Filled 2015-10-01 (×3): qty 1
  Filled 2015-10-01 (×2): qty 2

## 2015-10-01 MED ORDER — LEVOFLOXACIN IN D5W 500 MG/100ML IV SOLN
500.0000 mg | Freq: Once | INTRAVENOUS | Status: AC
  Administered 2015-10-01: 500 mg via INTRAVENOUS
  Filled 2015-10-01: qty 100

## 2015-10-01 MED ORDER — ACETAMINOPHEN 325 MG PO TABS: 650.0000 mg | ORAL_TABLET | Freq: Four times a day (QID) | ORAL | Status: DC | PRN

## 2015-10-01 MED ORDER — BUSPIRONE HCL 15 MG PO TABS
7.5000 mg | ORAL_TABLET | Freq: Two times a day (BID) | ORAL | Status: DC | PRN
  Filled 2015-10-01: qty 1

## 2015-10-01 MED ORDER — MONTELUKAST SODIUM 10 MG PO TABS
10.0000 mg | ORAL_TABLET | Freq: Every day | ORAL | Status: DC
  Administered 2015-10-02 – 2015-10-05 (×4): 10 mg via ORAL
  Filled 2015-10-01 (×4): qty 1

## 2015-10-01 MED ORDER — LORATADINE 10 MG PO TABS
10.0000 mg | ORAL_TABLET | Freq: Every day | ORAL | Status: DC
  Administered 2015-10-02 – 2015-10-05 (×4): 10 mg via ORAL
  Filled 2015-10-01 (×4): qty 1

## 2015-10-01 MED ORDER — NITROGLYCERIN 0.4 MG SL SUBL: 0.4000 mg | SUBLINGUAL_TABLET | SUBLINGUAL | Status: DC | PRN

## 2015-10-01 MED ORDER — SODIUM CHLORIDE 0.9 % IV SOLN
INTRAVENOUS | Status: AC
  Administered 2015-10-01: 22:00:00 via INTRAVENOUS

## 2015-10-01 MED ORDER — PANTOPRAZOLE SODIUM 40 MG PO TBEC
40.0000 mg | DELAYED_RELEASE_TABLET | Freq: Every day | ORAL | Status: DC
  Administered 2015-10-02 – 2015-10-05 (×4): 40 mg via ORAL
  Filled 2015-10-01 (×4): qty 1

## 2015-10-01 NOTE — ED Provider Notes (Signed)
CSN: [id number]     Arrival date & time 10/01/15  1438 History   First MD Initiated Contact with Patient 10/01/15 1458     Chief Complaint  Patient presents with  . Cellulitis  . Pneumonia   HPI  Thomas Ramos is a 45 year old male with PMHx of cerebral palsy, quadriplegia, GERD and DM presenting with worsening cellulitis and untreated CAP. Pt was hospitalized 7/7 - 7/11 for PNA and cellulitis. Pt had DVT work up of LLE during hospitalized which was negative for blood clot. Pt presented to his PCP today for a hospital follow up and was sent immediately to ED for worsening cellulitis. Pt states there was a miscommunication since his discharge from the hospital and he has not filled his prescription for bactrim or levaquin. He has been 7 days without treatment for PNA or cellulitis. His caregiver is at bedside and confirms that the erythema on his left lower leg and swelling has worsened since discharge. Photos provided by the caregiver of pt's legs shows erythema extending approximately 3-4 cm above pt's ankle. Current erythema extends significantly past this mark and is just distal to the knee. Pt reports increased pain in the lower extremity which is controlled well with his vicodin. Pt endorses a mild cough but no sputum production, chest pain, shortness of breath or wheezing. Denies subjective fevers and caregiver is uncertain of documented temperatures over the past week. Pt denies headaches, URI symptoms, abdominal pain, nausea, vomiting or diarrhea.   Past Medical History  Diagnosis Date  . Motility disorder, esophageal   . Esophageal stricture   . Hypertension   . Pneumonia   . GERD (gastroesophageal reflux disease)   . CP (cerebral palsy) ([place])   . S/P Botox injection     approx every 4 months  . Seasonal allergies   . Environmental allergies     takes inhalers if needed  . 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[place])   . Cerebral palsy ([place])   . Diabetes mellitus without complication (HCC)      borderline  . Anxiety   . Chronic diastolic (congestive) heart failure [facility])    Past Surgical History  Procedure Laterality Date  . Tendon release    . Mouth surgery    . Esophagus surgery      stretched esophagus  . Legs    . Esophagogastroduodenoscopy N/A 12/21/2012    Procedure: ESOPHAGOGASTRODUODENOSCOPY (EGD);  Surgeon: Inda Castle, MD;  Location: [facility];  Service: Endoscopy;  Laterality: N/A;  . Botox injection N/A 12/21/2012    Procedure: BOTOX INJECTION;  Surgeon: Inda Castle, MD;  Location: [facility];  Service: Endoscopy;  Laterality: N/A;  . Esophagogastroduodenoscopy N/A 06/28/2014    Procedure: ESOPHAGOGASTRODUODENOSCOPY (EGD);  Surgeon: Inda Castle, MD;  Location: [facility];  Service: Endoscopy;  Laterality: N/A;  . Botox injection N/A 06/28/2014    Procedure: BOTOX INJECTION;  Surgeon: Inda Castle, MD;  Location: [facility];  Service: Endoscopy;  Laterality: N/A;   Family History  Problem Relation Age of Onset  . Hypertension Father   . Lung cancer Father   . Diabetes Mother    Social History  Substance Use Topics  . Smoking status: Former Research scientist (life sciences)  . Smokeless tobacco: Former Systems developer     Comment: smoked one time  . Alcohol Use: 0.0 oz/week     Comment: 2 beers every other Friday and Saturday    Review of Systems  Constitutional: Negative for fever.  Respiratory: Positive for cough.  Negative for shortness of breath and wheezing.   Cardiovascular: Negative for chest pain.  Gastrointestinal: Negative for nausea, vomiting, abdominal pain and diarrhea.  Musculoskeletal: Positive for myalgias.  Skin: Positive for color change.  Neurological: Negative for headaches.  All other systems reviewed and are negative.     Allergies  Metronidazole and Penicillins  Home Medications   Prior to Admission medications   Medication Sig Start Date End Date Taking? Authorizing Provider  albuterol (PROVENTIL HFA;VENTOLIN HFA) 108 (90 Base) MCG/ACT  inhaler Inhale 1-2 puffs into the lungs every 6 (six) hours as needed for wheezing or shortness of breath. 08/26/15  Yes Ripley Fraise, MD  amLODipine (NORVASC) 10 MG tablet Take 10 mg by mouth daily.   Yes Historical Provider, MD  baclofen (LIORESAL) 10 MG tablet Take 10 mg by mouth 3 (three) times daily as needed. For neck stiffness/pain. 08/30/15  Yes Historical Provider, MD  busPIRone (BUSPAR) 7.5 MG tablet Take 7.5 mg by mouth 2 (two) times daily as needed (for anxiousness).    Yes Historical Provider, MD  cetirizine (ZYRTEC) 10 MG tablet Take 10 mg by mouth daily.  03/01/14  Yes Historical Provider, MD  citalopram (CELEXA) 20 MG tablet Take 20 mg by mouth every evening.   Yes Historical Provider, MD  CRESTOR 5 MG tablet Take 5 mg by mouth at bedtime. 04/22/14  Yes Historical Provider, MD  fluticasone (FLONASE) 50 MCG/ACT nasal spray Place 1 spray into both nostrils daily.  03/01/14  Yes Historical Provider, MD  HYDROcodone-acetaminophen (NORCO) 5-325 MG tablet Take 1 tablet by mouth every 4 (four) hours as needed for moderate pain. 0000000  Yes Delora Fuel, MD  montelukast (SINGULAIR) 10 MG tablet Take 10 mg by mouth daily.   Yes Historical Provider, MD  nitroGLYCERIN (NITROSTAT) 0.4 MG SL tablet Place 0.4 mg under the tongue every 5 (five) minutes as needed. For chest pain. 08/26/15  Yes Historical Provider, MD  omeprazole (PRILOSEC) 20 MG capsule Take 1 capsule (20 mg total) by mouth daily. 08/11/15  Yes April Palumbo, MD  ranitidine (ZANTAC) 150 MG tablet Take 150 mg by mouth 2 (two) times daily as needed. For heartburn. 08/26/15  Yes Historical Provider, MD  valsartan (DIOVAN) 80 MG tablet Take 80 mg by mouth daily. 08/26/15  Yes Historical Provider, MD  levofloxacin (LEVAQUIN) 750 MG tablet Take 1 tablet (750 mg total) by mouth daily. 09/23/15   Donne Hazel, MD  sulfamethoxazole-trimethoprim (BACTRIM DS,SEPTRA DS) 800-160 MG tablet Take 1 tablet by mouth 2 (two) times daily. 09/23/15   Donne Hazel, MD   BP 132/84 mmHg  Pulse 81  Temp(Src) 98.1 F (36.7 C) (Oral)  Resp 18  SpO2 99% Physical Exam  Constitutional: He is oriented to person, place, and time. He appears well-developed and well-nourished. No distress.  HENT:  Head: Normocephalic and atraumatic.  Eyes: Conjunctivae are normal. Pupils are equal, round, and reactive to light. Right eye exhibits no discharge. Left eye exhibits no discharge. No scleral icterus.  Neck: Normal range of motion. Neck supple.  Cardiovascular: Normal rate, regular rhythm and normal heart sounds.   Pulmonary/Chest: Effort normal and breath sounds normal. No respiratory distress.  Abdominal: Soft. He exhibits no distension. There is no tenderness.  Musculoskeletal: Normal range of motion.  Neurological: He is alert and oriented to person, place, and time. Coordination normal.  Dysarthric speech. Otherwise cranial nerves intact. Quadriplegia.   Skin: Skin is warm and dry. There is erythema.  Left lower extremity erythema  from the foot to knee. When compared with photos from pt's discharge, the erythema is spreading. No streaking. Left lower leg is swollen and tender. Warm to the touch.   Psychiatric: He has a normal mood and affect. His behavior is normal.  Nursing note and vitals reviewed.   ED Course  Procedures (including critical care time) Labs Review Labs Reviewed  CBC WITH DIFFERENTIAL/PLATELET - Abnormal; Notable for the following:    Hemoglobin 12.5 (*)    HCT 37.7 (*)    All other components within normal limits  BASIC METABOLIC PANEL - Abnormal; Notable for the following:    Glucose, Bld 111 (*)    Creatinine, Ser 0.47 (*)    All other components within normal limits  SEDIMENTATION RATE - Abnormal; Notable for the following:    Sed Rate 26 (*)    All other components within normal limits  C-REACTIVE PROTEIN - Abnormal; Notable for the following:    CRP 1.5 (*)    All other components within normal limits  CULTURE, BLOOD  (ROUTINE X 2)  CULTURE, BLOOD (ROUTINE X 2)  URINALYSIS, ROUTINE W REFLEX MICROSCOPIC (NOT AT [facility])  BRAIN NATRIURETIC PEPTIDE  BASIC METABOLIC PANEL  CBC  I-STAT CG4 LACTIC ACID, ED  I-STAT CG4 LACTIC ACID, ED    Imaging Review Dg Chest 2 View  10/01/2015  CLINICAL DATA:  Chest pain.  Recent pneumonia EXAM: CHEST  2 VIEW COMPARISON:  Chest radiograph Aug 07, 2015 and chest CT September 20, 2015 FINDINGS: There is stable elevation of the right hemidiaphragm. There is no appreciable edema or consolidation. Heart is mildly enlarged with pulmonary vascularity within normal limits. No adenopathy. There is degenerative change in the thoracic spine. IMPRESSION: Stable elevation the right hemidiaphragm. Stable cardiac silhouette. No edema or consolidation. Electronically Signed   By: Lowella Grip III M.D.   On: 10/01/2015 15:47   I have personally reviewed and evaluated these images and lab results as part of my medical decision-making.   EKG Interpretation None      MDM   Final diagnoses:  Cellulitis of left lower extremity  Left leg swelling   45 year old male presenting with worsening cellulitis who was discharged from hospital one week ago. Recently hospitalized for pneumonia and cellulitis. Discharged on Bactrim and Levaquin which patient has not been taking. Follow up with PCP today who notes worsening cellulitis and was transferred to ER for workup. Afebrile. VSS. Nontoxic appearing. Cellulitis of the left lower extremity noted from the foot to knee. When compared to pictures from week ago, this is certainly worsening. Lungs clear to auscultation bilaterally. Heart regular rate and rhythm. No leukocytosis. BMET unremarkable. Lactic acid 0.93. Chest x-ray without sign of infection. Given dose of Levaquin and Bactrim in emergency department. Patient is not septic. Given worsening cellulitis, will consult hospitalist for admission. Dr. Blaine Hamper accepting.   Lahoma Crocker Cella Cappello, PA-C 10/02/15  [phone]  Davonna Belling, MD 10/02/15 2352

## 2015-10-01 NOTE — ED Notes (Signed)
Patient was given sandwich and sprite.

## 2015-10-01 NOTE — Progress Notes (Signed)
Pharmacy Antibiotic Note  Thomas Ramos is a 45 y.o. male admitted on 10/01/2015 with worsening CAP and nonpurulent cellulitis of LLE.  Pt was hospitalized 7/7-7/11 for same but d/t miscommunication has not been able to fill Rx since discharge.  Pharmacy consulted for PO Bactrim for cellulitis d/t PCN allergy (hives, N/V only, but no record of tolerating cephalosporins in Epic).  Also started on Levaquin per MD for CAP  Plan:  Bactrim DS, 2 tabs PO q12 hr; ensure adequate hydration to avoid crystalluria  Recommended duration is at least 5 days for uncomplicated SSTIs  Levaquin per MD; dosing appropriate  Bactrim coverage of group A strep is poor; would continue Levaquin until cellulitis resolved, even if CAP treatment completed.  As renal function is excellent, do not anticipate further dose adjustments needed.  Pharmacy will sign off at this time.     Temp (24hrs), Avg:99 F (37.2 C), Min:98.5 F (36.9 C), Max:99.5 F (37.5 C)   Recent Labs Lab 10/01/15 1557  WBC 7.5  CREATININE 0.47*    Estimated Creatinine Clearance: 104.2 mL/min (by C-G formula based on Cr of 0.47).    Allergies  Allergen Reactions  . Metronidazole Nausea And Vomiting  . Penicillins Hives and Nausea And Vomiting    Has patient had a PCN reaction causing immediate rash, facial/tongue/throat swelling, SOB or lightheadedness with hypotension: yes Has patient had a PCN reaction causing severe rash involving mucus membranes or skin necrosis: no Has patient had a PCN reaction that required hospitalization: no Has patient had a PCN reaction occurring within the last 10 years: no If all of the above answers are "NO", then may proceed with Cephalosporin use.     Microbiology results: 7/7 BCx: NGF   Thank you for allowing pharmacy to be a part of this patient's care.  Reuel Boom, PharmD, BCPS Pager: [phone] 10/01/2015, 5:32 PM

## 2015-10-01 NOTE — ED Notes (Signed)
Bed: [id number] Expected date:  Expected time:  Means of arrival:  Comments: EMS - cellulitis

## 2015-10-01 NOTE — ED Notes (Signed)
Patient comes from [facility] in [place] for cellulitis that has gotten worse on bilat legs.  Patient was seen at here recently for PNA and never started antibiotics, so when he had follow up appt today with PCP they wanted him seen here for eval of PNA and cellulitis.

## 2015-10-01 NOTE — H&P (Signed)
History and Physical    Thomas Ramos [id number] DOB: [birth year] DOA: 10/01/2015  Referring MD/NP/PA:   PCP: Chesley Noon, MD   Patient coming from:  The patient is coming from home.  At baseline, pt is dependent for most of ADL.  Chief Complaint: Left leg swelling and pain  HPI: Thomas Ramos is a 45 y.o. male with medical history significant of hypertension, hyperlipidemia, GERD, depression, anxiety, esophageal stricture, cerebral palsy, dCHF, quadriplegia, allergy, who presents with left leg swelling and pain.  Patient was recently hospitalized from 7/7 to 7/10 and treated for left leg cellulitis and pneumonia. Patient was discharged at a stable condition on Levaquin and Bactrim, but patient did not start taking antibiotics after he went home. He states that his leg pain has been worsening. It is constant, moderate, nonradiating. His leg swelling seems to get worse too. He does not have fever or chills. He states that he has mild dry cough, no chest pain or shortness of breath anymore. Patient denies nausea, vomiting, abdominal pain, diarrhea, symptoms of UTI.   ED Course: pt was found to have Negative urinalysis, WBC 7.5, lactate is 0.93, temperature normal, no tachycardia, no tachypnea, electrolytes and renal function okay. Chest x-rays is negative for infiltration, but showed a stable right hemidiaphragm elevation.  Review of Systems:   General: no fevers, chills, no changes in body weight, has poor appetite, has fatigue HEENT: no blurry vision, hearing changes or sore throat Pulm: no dyspnea, has coughing, no wheezing CV: no chest pain, no palpitations Abd: no nausea, vomiting, abdominal pain, diarrhea, constipation GU: no dysuria, burning on urination, increased urinary frequency, hematuria  Ext:  Has left leg swelling, redness and pain Neuro: has quadriplegia Skin: no rash MSK: No muscle spasm, no deformity, no limitation of range of movement in spin Heme: No easy  bruising.  Travel history: No recent long distant travel.  Allergy:  Allergies  Allergen Reactions  . Metronidazole Nausea And Vomiting  . Penicillins Hives and Nausea And Vomiting    Has patient had a PCN reaction causing immediate rash, facial/tongue/throat swelling, SOB or lightheadedness with hypotension: yes Has patient had a PCN reaction causing severe rash involving mucus membranes or skin necrosis: no Has patient had a PCN reaction that required hospitalization: no Has patient had a PCN reaction occurring within the last 10 years: no If all of the above answers are "NO", then may proceed with Cephalosporin use.     Past Medical History  Diagnosis Date  . Motility disorder, esophageal   . Esophageal stricture   . Hypertension   . Pneumonia   . GERD (gastroesophageal reflux disease)   . CP (cerebral palsy) ([place])   . S/P Botox injection     approx every 4 months  . Seasonal allergies   . Environmental allergies     takes inhalers if needed  . 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[place])   . Cerebral palsy ([place])   . 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 Anxiety   . Chronic diastolic (congestive) heart failure [facility])     Past Surgical History  Procedure Laterality Date  . Tendon release    . Mouth surgery    . Esophagus surgery      stretched esophagus  . Legs    . Esophagogastroduodenoscopy N/A 12/21/2012    Procedure: ESOPHAGOGASTRODUODENOSCOPY (EGD);  Surgeon: Inda Castle, MD;  Location: [facility];  Service: Endoscopy;  Laterality: N/A;  . Botox injection N/A  12/21/2012    Procedure: BOTOX INJECTION;  Surgeon: Inda Castle, MD;  Location: [facility];  Service: Endoscopy;  Laterality: N/A;  . Esophagogastroduodenoscopy N/A 06/28/2014    Procedure: ESOPHAGOGASTRODUODENOSCOPY (EGD);  Surgeon: Inda Castle, MD;  Location: [place];  Service: Endoscopy;  Laterality: N/A;  . Botox injection N/A 06/28/2014    Procedure: BOTOX INJECTION;   Surgeon: Inda Castle, MD;  Location: [place];  Service: Endoscopy;  Laterality: N/A;    Social History:  reports that he has quit smoking. He has quit using smokeless tobacco. He reports that he drinks alcohol. He reports that he does not use illicit drugs.  Family History:  Family History  Problem Relation Age of Onset  . Hypertension Father   . Lung cancer Father   . Diabetes Mother      Prior to Admission medications   Medication Sig Start Date End Date Taking? Authorizing Provider  albuterol (PROVENTIL HFA;VENTOLIN HFA) 108 (90 Base) MCG/ACT inhaler Inhale 1-2 puffs into the lungs every 6 (six) hours as needed for wheezing or shortness of breath. 08/26/15  Yes Ripley Fraise, MD  amLODipine (NORVASC) 10 MG tablet Take 10 mg by mouth daily.   Yes Historical Provider, MD  baclofen (LIORESAL) 10 MG tablet Take 10 mg by mouth 3 (three) times daily as needed. For neck stiffness/pain. 08/30/15  Yes Historical Provider, MD  busPIRone (BUSPAR) 7.5 MG tablet Take 7.5 mg by mouth 2 (two) times daily as needed (for anxiousness).    Yes Historical Provider, MD  cetirizine (ZYRTEC) 10 MG tablet Take 10 mg by mouth daily.  03/01/14  Yes Historical Provider, MD  citalopram (CELEXA) 20 MG tablet Take 20 mg by mouth every evening.   Yes Historical Provider, MD  CRESTOR 5 MG tablet Take 5 mg by mouth at bedtime. 04/22/14  Yes Historical Provider, MD  fluticasone (FLONASE) 50 MCG/ACT nasal spray Place 1 spray into both nostrils daily.  03/01/14  Yes Historical Provider, MD  HYDROcodone-acetaminophen (NORCO) 5-325 MG tablet Take 1 tablet by mouth every 4 (four) hours as needed for moderate pain. 0000000  Yes Delora Fuel, MD  montelukast (SINGULAIR) 10 MG tablet Take 10 mg by mouth daily.   Yes Historical Provider, MD  nitroGLYCERIN (NITROSTAT) 0.4 MG SL tablet Place 0.4 mg under the tongue every 5 (five) minutes as needed. For chest pain. 08/26/15  Yes Historical Provider, MD  omeprazole (PRILOSEC) 20  MG capsule Take 1 capsule (20 mg total) by mouth daily. 08/11/15  Yes April Palumbo, MD  ranitidine (ZANTAC) 150 MG tablet Take 150 mg by mouth 2 (two) times daily as needed. For heartburn. 08/26/15  Yes Historical Provider, MD  valsartan (DIOVAN) 80 MG tablet Take 80 mg by mouth daily. 08/26/15  Yes Historical Provider, MD  levofloxacin (LEVAQUIN) 750 MG tablet Take 1 tablet (750 mg total) by mouth daily. 09/23/15   Donne Hazel, MD  sulfamethoxazole-trimethoprim (BACTRIM DS,SEPTRA DS) 800-160 MG tablet Take 1 tablet by mouth 2 (two) times daily. 09/23/15   Donne Hazel, MD    Physical Exam: Filed Vitals:   10/01/15 1446 10/01/15 1710 10/01/15 1930 10/01/15 2041  BP: 121/83 144/91 132/85 132/84  Pulse: 80 87 80 81  Temp: 99.5 F (37.5 C) 98.5 F (36.9 C)  98.1 F (36.7 C)  TempSrc: Oral Rectal  Oral  Resp: 16 16 17 18   SpO2: 96% 98% 99% 99%   General: Not in acute distress HEENT:       Eyes:  PERRL, EOMI, no scleral icterus.       ENT: No discharge from the ears and nose, no pharynx injection, no tonsillar enlargement.        Neck: No JVD, no bruit, no mass felt. Heme: No neck lymph node enlargement. Cardiac: S1/S2, RRR, No murmurs, No gallops or rubs. Pulm: No rales, wheezing, rhonchi or rubs. Abd: Soft, nondistended, nontender, no rebound pain, no organomegaly, BS present. GU: No hematuria Ext: 2+DP/PT pulse bilaterally. Has left leg swelling, warmth, redness and tenderness. Left LE erythema extending from foot to knee. When compared with photos from pt's discharge, the erythema is spreading.  Musculoskeletal: No joint deformities, No joint redness or warmth, no limitation of ROM in spin. Skin: No rashes.  Neuro: Alert, oriented X3, cranial nerves II-XII grossly intact except for dysarthric speech, has quadriplegia Psych: Patient is not psychotic, no suicidal or hemocidal ideation.  Labs on Admission: I have personally reviewed following labs and imaging studies  CBC:  Recent  Labs Lab 10/01/15 1557  WBC 7.5  NEUTROABS 4.6  HGB 12.5*  HCT 37.7*  MCV 82.3  PLT 123456   Basic Metabolic Panel:  Recent Labs Lab 10/01/15 1557  NA 137  K 3.8  CL 103  CO2 26  GLUCOSE 111*  BUN 15  CREATININE 0.47*  CALCIUM 8.9   GFR: Estimated Creatinine Clearance: 104.2 mL/min (by C-G formula based on Cr of 0.47). Liver Function Tests: No results for input(s): AST, ALT, ALKPHOS, BILITOT, PROT, ALBUMIN in the last 168 hours. No results for input(s): LIPASE, AMYLASE in the last 168 hours. No results for input(s): AMMONIA in the last 168 hours. Coagulation Profile: No results for input(s): INR, PROTIME in the last 168 hours. Cardiac Enzymes: No results for input(s): CKTOTAL, CKMB, CKMBINDEX, TROPONINI in the last 168 hours. BNP (last 3 results) No results for input(s): PROBNP in the last 8760 hours. HbA1C: No results for input(s): HGBA1C in the last 72 hours. CBG: No results for input(s): GLUCAP in the last 168 hours. Lipid Profile: No results for input(s): CHOL, HDL, LDLCALC, TRIG, CHOLHDL, LDLDIRECT in the last 72 hours. Thyroid Function Tests: No results for input(s): TSH, T4TOTAL, FREET4, T3FREE, THYROIDAB in the last 72 hours. Anemia Panel: No results for input(s): VITAMINB12, FOLATE, FERRITIN, TIBC, IRON, RETICCTPCT in the last 72 hours. Urine analysis:    Component Value Date/Time   COLORURINE YELLOW 10/01/2015 1557   APPEARANCEUR CLEAR 10/01/2015 1557   LABSPEC 1.025 10/01/2015 1557   PHURINE 6.0 10/01/2015 1557   GLUCOSEU NEGATIVE 10/01/2015 1557   HGBUR NEGATIVE 10/01/2015 1557   BILIRUBINUR NEGATIVE 10/01/2015 1557   KETONESUR NEGATIVE 10/01/2015 1557   PROTEINUR NEGATIVE 10/01/2015 1557   UROBILINOGEN 1.0 05/26/2014 2211   NITRITE NEGATIVE 10/01/2015 1557   LEUKOCYTESUR NEGATIVE 10/01/2015 1557   Sepsis Labs: @LABRCNTIP (procalcitonin:4,lacticidven:4) )No results found for this or any previous visit (from the past 240 hour(s)).    Radiological Exams on Admission: Dg Chest 2 View  10/01/2015  CLINICAL DATA:  Chest pain.  Recent pneumonia EXAM: CHEST  2 VIEW COMPARISON:  Chest radiograph Aug 07, 2015 and chest CT September 20, 2015 FINDINGS: There is stable elevation of the right hemidiaphragm. There is no appreciable edema or consolidation. Heart is mildly enlarged with pulmonary vascularity within normal limits. No adenopathy. There is degenerative change in the thoracic spine. IMPRESSION: Stable elevation the right hemidiaphragm. Stable cardiac silhouette. No edema or consolidation. Electronically Signed   By: Lowella Grip III M.D.   On: 10/01/2015 15:47  EKG: Not done in ED, will get one.   Assessment/Plan Principal Problem:   Cellulitis of left leg Active Problems:   Dysphagia   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HCC)   HTN (hypertension)   Asthma   GERD (gastroesophageal reflux disease)   Community acquired pneumonia   Depression with anxiety   Chronic diastolic (congestive) heart failure (HCC)   Cellulitis of left leg: his LLE cellulitis has been worsening. Likely due to medication noncompliance to antibiotics. Patient is not septic, no fever, leukocytosis, tachycardia. Lactate is normal. Hemodynamically stable. Pt had negative LE venous doppler on 09/20/17, but it was limited study -Will place on tele bed for obs -Start Levaquin and Bactrim -Follow-up blood culture -Pain control: When necessary Norco -repeat LE venous doppler  Congenital cerebral palsy and Dysphagia: -continue baclofen -Dysphagia diet 3  HTN:  -Continue amlodipine and diovan  GERD: -Protonix -Pepcid  Community acquired pneumonia: pt has mild cough, no fever, leukocytosis, chest pain. His pneumonia is under control. -On Levaquin and Bactrim -When necessary albuterol nebulizers for shortness of breath  Depression and anxiety: Stable, no suicidal or homicidal ideations. -Continue home medications: BuSpar and Celexa  Asthma:  stable -When necessary albuterol nebulizer as above -Continue Singulair  HLD: Last LDL was 132 on 04/17/10 -Continue home medications: Crestor  Chronic diastolic congestive heart failure: 2-D echo on 07/23/15 showed EF 55-60 percent with grade 2 diastolic dysfunction. Pt does not have right leg edema. No JVD. BNP 34.5. CHF is compensated. -Watch volume status carefully.     DVT ppx: SQ Lovenox Code Status: Full code Family Communication: None at bed side.  Disposition Plan:  Anticipate discharge back to previous home environment Consults called:  none Admission status: Obs / tele  Date of Service 10/01/2015    Ivor Costa [facility] Pager [phone]  If 7PM-7AM, please contact night-coverage [email or web address] Password [email or web address] 10/01/2015, 11:18 PM

## 2015-10-02 ENCOUNTER — Observation Stay (HOSPITAL_BASED_OUTPATIENT_CLINIC_OR_DEPARTMENT_OTHER): Payer: Medicare Other

## 2015-10-02 DIAGNOSIS — Z87891 Personal history of nicotine dependence: Secondary | ICD-10-CM | POA: Diagnosis not present

## 2015-10-02 DIAGNOSIS — Z8701 Personal history of pneumonia (recurrent): Secondary | ICD-10-CM | POA: Diagnosis not present

## 2015-10-02 DIAGNOSIS — Z880 Allergy status to penicillin: Secondary | ICD-10-CM | POA: Diagnosis not present

## 2015-10-02 DIAGNOSIS — E119 Type 2 diabetes mellitus without complications: Secondary | ICD-10-CM | POA: Diagnosis present

## 2015-10-02 DIAGNOSIS — R1310 Dysphagia, unspecified: Secondary | ICD-10-CM | POA: Diagnosis present

## 2015-10-02 DIAGNOSIS — Z792 Long term (current) use of antibiotics: Secondary | ICD-10-CM | POA: Diagnosis not present

## 2015-10-02 DIAGNOSIS — I5032 Chronic diastolic (congestive) heart failure: Secondary | ICD-10-CM | POA: Diagnosis present

## 2015-10-02 DIAGNOSIS — F418 Other specified anxiety disorders: Secondary | ICD-10-CM

## 2015-10-02 DIAGNOSIS — G809 Cerebral palsy, unspecified: Secondary | ICD-10-CM | POA: Diagnosis present

## 2015-10-02 DIAGNOSIS — M7989 Other specified soft tissue disorders: Secondary | ICD-10-CM

## 2015-10-02 DIAGNOSIS — Z8249 Family history of ischemic heart disease and other diseases of the circulatory system: Secondary | ICD-10-CM | POA: Diagnosis not present

## 2015-10-02 DIAGNOSIS — J45909 Unspecified asthma, uncomplicated: Secondary | ICD-10-CM | POA: Diagnosis present

## 2015-10-02 DIAGNOSIS — J189 Pneumonia, unspecified organism: Secondary | ICD-10-CM

## 2015-10-02 DIAGNOSIS — I110 Hypertensive heart disease with heart failure: Secondary | ICD-10-CM | POA: Diagnosis present

## 2015-10-02 DIAGNOSIS — I10 Essential (primary) hypertension: Secondary | ICD-10-CM

## 2015-10-02 DIAGNOSIS — K219 Gastro-esophageal reflux disease without esophagitis: Secondary | ICD-10-CM | POA: Diagnosis present

## 2015-10-02 DIAGNOSIS — Z881 Allergy status to other antibiotic agents status: Secondary | ICD-10-CM | POA: Diagnosis not present

## 2015-10-02 DIAGNOSIS — K222 Esophageal obstruction: Secondary | ICD-10-CM | POA: Diagnosis present

## 2015-10-02 DIAGNOSIS — L03116 Cellulitis of left lower limb: Secondary | ICD-10-CM | POA: Diagnosis present

## 2015-10-02 DIAGNOSIS — Z9114 Patient's other noncompliance with medication regimen: Secondary | ICD-10-CM | POA: Diagnosis not present

## 2015-10-02 DIAGNOSIS — Z833 Family history of diabetes mellitus: Secondary | ICD-10-CM | POA: Diagnosis not present

## 2015-10-02 DIAGNOSIS — Z801 Family history of malignant neoplasm of trachea, bronchus and lung: Secondary | ICD-10-CM | POA: Diagnosis not present

## 2015-10-02 DIAGNOSIS — E785 Hyperlipidemia, unspecified: Secondary | ICD-10-CM | POA: Diagnosis present

## 2015-10-02 DIAGNOSIS — Z7951 Long term (current) use of inhaled steroids: Secondary | ICD-10-CM | POA: Diagnosis not present

## 2015-10-02 DIAGNOSIS — Z888 Allergy status to other drugs, medicaments and biological substances status: Secondary | ICD-10-CM | POA: Diagnosis not present

## 2015-10-02 DIAGNOSIS — Z79899 Other long term (current) drug therapy: Secondary | ICD-10-CM | POA: Diagnosis not present

## 2015-10-02 LAB — BASIC METABOLIC PANEL
Anion gap: 5 (ref 5–15)
BUN: 10 mg/dL (ref 6–20)
CALCIUM: 8.8 mg/dL — AB (ref 8.9–10.3)
CHLORIDE: 106 mmol/L (ref 101–111)
CO2: 26 mmol/L (ref 22–32)
Creatinine, Ser: 0.62 mg/dL (ref 0.61–1.24)
GFR calc non Af Amer: >60 mL/min (ref >60)
Glucose, Bld: 95 mg/dL (ref 65–99)
Potassium: 4.1 mmol/L (ref 3.5–5.1)
Sodium: 137 mmol/L (ref 135–145)

## 2015-10-02 LAB — CBC
HCT: 34.7 % — ABNORMAL LOW (ref 39.0–52.0)
HEMOGLOBIN: 11.6 g/dL — AB (ref 13.0–17.0)
MCH: 27.8 pg (ref 26.0–34.0)
MCHC: 33.4 g/dL (ref 30.0–36.0)
MCV: 83.2 fL (ref 78.0–100.0)
PLATELETS: 245 10*3/uL (ref 150–400)
RBC: 4.17 MIL/uL — AB (ref 4.22–5.81)
RDW: 13.2 % (ref 11.5–15.5)
WBC: 5.9 10*3/uL (ref 4.0–10.5)

## 2015-10-02 MED ORDER — VANCOMYCIN HCL 10 G IV SOLR
1250.0000 mg | Freq: Two times a day (BID) | INTRAVENOUS | Status: DC
  Administered 2015-10-02 – 2015-10-04 (×3): 1250 mg via INTRAVENOUS
  Filled 2015-10-02 (×4): qty 1250

## 2015-10-02 MED ORDER — VANCOMYCIN HCL 10 G IV SOLR
1250.0000 mg | Freq: Once | INTRAVENOUS | Status: AC
  Administered 2015-10-02: 1250 mg via INTRAVENOUS
  Filled 2015-10-02: qty 1250

## 2015-10-02 MED ORDER — SODIUM CHLORIDE 0.9 % IV SOLN
1.5000 g | Freq: Four times a day (QID) | INTRAVENOUS | Status: DC
  Administered 2015-10-02 – 2015-10-04 (×8): 1.5 g via INTRAVENOUS
  Filled 2015-10-02 (×9): qty 1.5

## 2015-10-02 NOTE — Progress Notes (Addendum)
[facility] PROGRESS NOTE    Progress Note  BOLDEN MEST  [id number] DOB: 01-20-1971 DOA: 10/01/2015 PCP: Chesley Noon, MD     Brief Narrative:   Thomas Ramos is an 45 y.o. male past medical history of essential hypertension. She'll stricture cervical palsy, recently discharged from the hospital on 09/22/2012 was noncompliant with his antibiotics when he went home came in with worsening leg pain and erythema.  Assessment/Plan:   Cellulitis of left leg He was started on admission on Levaquin and Bactrim we'll go ahead and DC these, IV vancomycin. We'll monitor his fever curve, CBC and see how he tolerates his diet. Lower extremity Doppler was negative for DVT. He relates pain is not improved.Erythema has not receded.  Community-acquired pneumonia: On discharge and 09/23/2015 CT angios of the chest show upper lobe pneumonia. He was noncompliant with his antibiotics at home he has been switched to IV vancomycin and Unasyn. His remained afebrile with no leukocytosis saturations remain stable.  Congenital cerebral palsy and dysphagia: Continue baclofen and dysphagia 3 diet.  Essential hypertension: No changes made to his medication.  Depression and anxiety: Continue current home regimen no changes are made.  Stable asthma: Continue Singulair.  Chronic diastolic congestive heart failure: With a 2-D echo on May 2017 that showed an EF of 55% with a grade 2 diastolic heart failure. No lower extremity edema. Seems to be well, say to continue current home regimen.  DVT prophylaxis: lovenox Family Communication:none Disposition Plan/Barrier to D/C: home in 2 days Code Status:     Code Status Orders        Start     Ordered   10/01/15 2002  Full code   Continuous     10/01/15 2002    Code Status History    Date Active Date Inactive Code Status Order ID Comments User Context   09/20/2015 11:43 PM 09/23/2015  5:24 PM Full Code [id number]  [name], MD ED   05/25/2014  1:20 AM 05/28/2014 11:17 PM Full Code [id number]  Allyne Gee, MD Inpatient        IV Access:    Peripheral IV   Procedures and diagnostic studies:   Dg Chest 2 View  10/01/2015  CLINICAL DATA:  Chest pain.  Recent pneumonia EXAM: CHEST  2 VIEW COMPARISON:  Chest radiograph Aug 07, 2015 and chest CT September 20, 2015 FINDINGS: There is stable elevation of the right hemidiaphragm. There is no appreciable edema or consolidation. Heart is mildly enlarged with pulmonary vascularity within normal limits. No adenopathy. There is degenerative change in the thoracic spine. IMPRESSION: Stable elevation the right hemidiaphragm. Stable cardiac silhouette. No edema or consolidation. Electronically Signed   By: Lowella Grip III M.D.   On: 10/01/2015 15:47     Medical Consultants:    None.  Anti-Infectives:   Vancomycin and Unasyn.  Subjective:    [name] he relates his left lower extremity still hurts, no shortness of breath or cough.  Objective:    Filed Vitals:   10/01/15 1710 10/01/15 1930 10/01/15 2041 10/02/15 0553  BP: 144/91 132/85 132/84 119/75  Pulse: 87 80 81 61  Temp: 98.5 F (36.9 C)  98.1 F (36.7 C) 97.7 F (36.5 C)  TempSrc: Rectal  Oral Oral  Resp: 16 17 18 16   Height:    5' (1.524 m)  Weight:    78.926 kg (174 lb)  SpO2: 98% 99% 99% 100%    Intake/Output Summary (Last 24 hours)  at 10/02/15 1034 Last data filed at 10/02/15 0830  Gross per 24 hour  Intake    120 ml  Output   1075 ml  Net   -955 ml   Filed Weights   10/02/15 0553  Weight: 78.926 kg (174 lb)    Exam: General exam: In no acute distress. Respiratory system: Good air movement and clear to auscultation. Cardiovascular system: S1 & S2 heard, RRR. No JVD. Gastrointestinal system: Abdomen is nondistended, soft and nontender.  Central nervous system: Alert and oriented. No focal neurological deficits. Extremities: No pedal edema. Skin: No rashes, lesions or  ulcers Psychiatry: Judgement and insight appear normal. Mood & affect appropriate.    Data Reviewed:    Labs: Basic Metabolic Panel:  Recent Labs Lab 10/01/15 1557 10/02/15 0506  NA 137 137  K 3.8 4.1  CL 103 106  CO2 26 26  GLUCOSE 111* 95  BUN 15 10  CREATININE 0.47* 0.62  CALCIUM 8.9 8.8*   GFR Estimated Creatinine Clearance: 103.7 mL/min (by C-G formula based on Cr of 0.62). Liver Function Tests: No results for input(s): AST, ALT, ALKPHOS, BILITOT, PROT, ALBUMIN in the last 168 hours. No results for input(s): LIPASE, AMYLASE in the last 168 hours. No results for input(s): AMMONIA in the last 168 hours. Coagulation profile No results for input(s): INR, PROTIME in the last 168 hours.  CBC:  Recent Labs Lab 10/01/15 1557 10/02/15 0506  WBC 7.5 5.9  NEUTROABS 4.6  --   HGB 12.5* 11.6*  HCT 37.7* 34.7*  MCV 82.3 83.2  PLT 273 245   Cardiac Enzymes: No results for input(s): CKTOTAL, CKMB, CKMBINDEX, TROPONINI in the last 168 hours. BNP (last 3 results) No results for input(s): PROBNP in the last 8760 hours. CBG: No results for input(s): GLUCAP in the last 168 hours. D-Dimer: No results for input(s): DDIMER in the last 72 hours. Hgb A1c: No results for input(s): HGBA1C in the last 72 hours. Lipid Profile: No results for input(s): CHOL, HDL, LDLCALC, TRIG, CHOLHDL, LDLDIRECT in the last 72 hours. Thyroid function studies: No results for input(s): TSH, T4TOTAL, T3FREE, THYROIDAB in the last 72 hours.  Invalid input(s): FREET3 Anemia work up: No results for input(s): VITAMINB12, FOLATE, FERRITIN, TIBC, IRON, RETICCTPCT in the last 72 hours. Sepsis Labs:  Recent Labs Lab 10/01/15 1557 10/01/15 1824 10/02/15 0506  WBC 7.5  --  5.9  LATICACIDVEN  --  0.93  --    Microbiology No results found for this or any previous visit (from the past 240 hour(s)).   Medications:   . amLODipine  10 mg Oral Daily  . ampicillin-sulbactam (UNASYN) IV  1.5 g  Intravenous Q6H  . citalopram  20 mg Oral QPM  . enoxaparin (LOVENOX) injection  40 mg Subcutaneous Q24H  . famotidine  20 mg Oral BID  . fluticasone  1 spray Each Nare Daily  . irbesartan  75 mg Oral Daily  . loratadine  10 mg Oral Daily  . montelukast  10 mg Oral Daily  . pantoprazole  40 mg Oral Daily  . rosuvastatin  5 mg Oral QHS  . vancomycin  1,250 mg Intravenous Once   Continuous Infusions:   Time spent: 25 min     Charlynne Cousins  [facility] Pager [phone]  *Please refer to [email or web address], password [email or web address] to get updated schedule on who will round on this patient, as hospitalists switch teams weekly. If 7PM-7AM, please contact night-coverage at [email or web address], password [email or web address] for any overnight needs.  10/02/2015, 10:34 AM

## 2015-10-02 NOTE — Progress Notes (Signed)
Preliminary results by tech - Left Lower Ext. Venous Duplex attempted. Technically limited study due to inability to visualize the deep system in its entirety. The common femoral, popliteal and posterior tibial veins appeared patent without evidence of obvious deep vein thrombosis. The femoral vein and peroneal veins were not visualized due to body habitus. Oda Cogan, BS, RDMS, RVT

## 2015-10-02 NOTE — Care Management Note (Signed)
Case Management Note  Patient Details  Name: Thomas Ramos MRN: [id number] Date of Birth: Feb 12, 1971  Subjective/Objective: 45 y/o m admitted w/L leg cellulitis. Failed otpt abx.Hx: Cerebral Palsy, quadriplegia. Patient works @ [employer], receives aide from the same company-7a-9a,3p-9p,company provides transp to & from  work. Patient states he will receive additional aide service soon. Will provide [facility] non emergency ambulance transp home @ d/c-address on face sheet confirmed.  Noted MD to call/e-pharmacy meds to [place] @ d/c.               Action/Plan:d/c plan home.   Expected Discharge Date:   (unknown)               Expected Discharge Plan:  Home/Self Care  In-House Referral:     Discharge planning Services  CM Consult  Post Acute Care Choice:  Durable Medical Equipment (w/c,hoyer lift,hospital bed) Choice offered to:     DME Arranged:    DME Agency:     HH Arranged:    HH Agency:     Status of Service:  In process, will continue to follow  If discussed at Long Length of Stay Meetings, dates discussed:    Additional Comments:  Dessa Phi, RN 10/02/2015, 11:22 AM

## 2015-10-02 NOTE — Progress Notes (Signed)
Pharmacy Antibiotic Note  Thomas Ramos is a 44 y.o. male admitted on 10/01/2015 with cellulitis/CAP.  Pharmacy has been consulted for vancomycin dosing.  Pt had recent admission and was not compliant with abx upon discharge, leading to worsening of cellulitis. Spoke to MD regarding listed antibiotic allergy to PCN and order for Unasyn. MD was ok starting medication and monitoring for any signs of allergic reaction to med.    Plan: Vancomycin 1250 IV every 12 hours.  Goal trough 15-20 mcg/mL.  Unayn 1.5 gr IV q6h ( MD)   Height: 5' (152.4 cm) Weight: 174 lb (78.926 kg) IBW/kg (Calculated) : 50  Temp (24hrs), Avg:98.5 F (36.9 C), Min:97.7 F (36.5 C), Max:99.5 F (37.5 C)   Recent Labs Lab 10/01/15 1557 10/01/15 1824 10/02/15 0506  WBC 7.5  --  5.9  CREATININE 0.47*  --  0.62  LATICACIDVEN  --  0.93  --     Estimated Creatinine Clearance: 103.7 mL/min (by C-G formula based on Cr of 0.62).    Allergies  Allergen Reactions  . Metronidazole Nausea And Vomiting  . Penicillins Hives and Nausea And Vomiting    Has patient had a PCN reaction causing immediate rash, facial/tongue/throat swelling, SOB or lightheadedness with hypotension: yes Has patient had a PCN reaction causing severe rash involving mucus membranes or skin necrosis: no Has patient had a PCN reaction that required hospitalization: no Has patient had a PCN reaction occurring within the last 10 years: no If all of the above answers are "NO", then may proceed with Cephalosporin use.     Antimicrobials this admission: Levofloxacin 7/18 >> 7/19 Septra 7/18 >> 7/19 Unasyn 7/19 >> Vancomycin 7/19 >>  Dose adjustments this admission: ---  Microbiology results: 7/8 BCx: NGF 7/18 BCx: sent  Thank you for allowing pharmacy to be a part of this patient's care.   Royetta Asal, PharmD, BCPS Pager [phone] 10/02/2015 10:56 AM

## 2015-10-03 NOTE — Progress Notes (Signed)
[facility] PROGRESS NOTE    Progress Note  Thomas Ramos  [id number] DOB: 03-31-1970 DOA: 10/01/2015 PCP: Chesley Noon, MD     Brief Narrative:   Thomas Ramos is an 45 y.o. male past medical history of essential hypertension. She'll stricture cervical palsy, recently discharged from the hospital on 09/22/2012 was noncompliant with his antibiotics when he went home came in with worsening leg pain and erythema.  Assessment/Plan:   Cellulitis of left leg Cont IV vancomycin. Continues to remain afebrile, and received a has not receded  Community-acquired pneumonia: On discharge and 09/23/2015 CT angios of the chest show upper lobe pneumonia. He was noncompliant with his medication continue IV vancomycin and Unasyn. Has remained afebrile with no leukocytosis.  Congenital cerebral palsy and dysphagia: Continue baclofen and dysphagia 3 diet.  Essential hypertension: No changes made to his medication. Blood pressure seems to be controlled.  Depression and anxiety: Continue current home regimen no changes are made.  Stable asthma: Continue Singulair.  Chronic diastolic congestive heart failure: Seems to be compensated continue current regimen.  DVT prophylaxis: lovenox Family Communication:none Disposition Plan/Barrier to D/C: home in 1 days Code Status:     Code Status Orders        Start     Ordered   10/01/15 2002  Full code   Continuous     10/01/15 2002    Code Status History    Date Active Date Inactive Code Status Order ID Comments User Context   09/20/2015 11:43 PM 09/23/2015  5:24 PM Full Code [id number]  [name], MD ED   05/25/2014  1:20 AM 05/28/2014 11:17 PM Full Code [id number]  Allyne Gee, MD Inpatient        IV Access:    Peripheral IV   Procedures and diagnostic studies:   Dg Chest 2 View  10/01/2015  CLINICAL DATA:  Chest pain.  Recent pneumonia EXAM: CHEST  2 VIEW COMPARISON:  Chest radiograph Aug 07, 2015 and chest CT  September 20, 2015 FINDINGS: There is stable elevation of the right hemidiaphragm. There is no appreciable edema or consolidation. Heart is mildly enlarged with pulmonary vascularity within normal limits. No adenopathy. There is degenerative change in the thoracic spine. IMPRESSION: Stable elevation the right hemidiaphragm. Stable cardiac silhouette. No edema or consolidation. Electronically Signed   By: Thomas Ramos M.D.   On: 10/01/2015 15:47     Medical Consultants:    None.  Anti-Infectives:   Vancomycin and Unasyn.  Subjective:    Thomas Ramos he relates his left lower extremity still hurts,Feels much better compared to yesterday.  Objective:    Filed Vitals:   10/02/15 1101 10/02/15 1312 10/02/15 2107 10/03/15 0503  BP: 121/79 104/71 113/69 115/64  Pulse: 80 83 73 73  Temp:  98.7 F (37.1 C) 98.1 F (36.7 C) 98.3 F (36.8 C)  TempSrc:  Oral Oral Oral  Resp:  18 18 16   Height:      Weight:      SpO2:  99% 95% 98%    Intake/Output Summary (Last 24 hours) at 10/03/15 1059 Last data filed at 10/03/15 0900  Gross per 24 hour  Intake    830 ml  Output   1550 ml  Net   -720 ml   Filed Weights   10/02/15 0553  Weight: 78.926 kg (174 lb)    Exam: General exam: In no acute distress. Respiratory system: Good air movement and clear to auscultation. Cardiovascular system:  S1 & S2 heard, RRR. No JVD. Gastrointestinal system: Abdomen is nondistended, soft and nontender.  Central nervous system: Alert and oriented. No focal neurological deficits. Extremities: No pedal edema. Skin: Erythema is persistent has not receded still warm and tender to touch Psychiatry: Judgement and insight appear normal. Mood & affect appropriate.    Data Reviewed:    Labs: Basic Metabolic Panel:  Recent Labs Lab 10/01/15 1557 10/02/15 0506  NA 137 137  K 3.8 4.1  CL 103 106  CO2 26 26  GLUCOSE 111* 95  BUN 15 10  CREATININE 0.47* 0.62  CALCIUM 8.9 8.8*    GFR Estimated Creatinine Clearance: 103.7 mL/min (by C-G formula based on Cr of 0.62). Liver Function Tests: No results for input(s): AST, ALT, ALKPHOS, BILITOT, PROT, ALBUMIN in the last 168 hours. No results for input(s): LIPASE, AMYLASE in the last 168 hours. No results for input(s): AMMONIA in the last 168 hours. Coagulation profile No results for input(s): INR, PROTIME in the last 168 hours.  CBC:  Recent Labs Lab 10/01/15 1557 10/02/15 0506  WBC 7.5 5.9  NEUTROABS 4.6  --   HGB 12.5* 11.6*  HCT 37.7* 34.7*  MCV 82.3 83.2  PLT 273 245   Cardiac Enzymes: No results for input(s): CKTOTAL, CKMB, CKMBINDEX, TROPONINI in the last 168 hours. BNP (last 3 results) No results for input(s): PROBNP in the last 8760 hours. CBG: No results for input(s): GLUCAP in the last 168 hours. D-Dimer: No results for input(s): DDIMER in the last 72 hours. Hgb A1c: No results for input(s): HGBA1C in the last 72 hours. Lipid Profile: No results for input(s): CHOL, HDL, LDLCALC, TRIG, CHOLHDL, LDLDIRECT in the last 72 hours. Thyroid function studies: No results for input(s): TSH, T4TOTAL, T3FREE, THYROIDAB in the last 72 hours.  Invalid input(s): FREET3 Anemia work up: No results for input(s): VITAMINB12, FOLATE, FERRITIN, TIBC, IRON, RETICCTPCT in the last 72 hours. Sepsis Labs:  Recent Labs Lab 10/01/15 1557 10/01/15 1824 10/02/15 0506  WBC 7.5  --  5.9  LATICACIDVEN  --  0.93  --    Microbiology Recent Results (from the past 240 hour(s))  Culture, blood (routine x 2)     Status: None (Preliminary result)   Collection Time: 10/01/15  8:54 PM  Result Value Ref Range Status   Specimen Description BLOOD LEFT HAND  Final   Special Requests BOTTLES DRAWN AEROBIC ONLY 5CC  Final   Culture   Final    NO GROWTH < 24 HOURS Performed at [facility]    Report Status PENDING  Incomplete  Culture, blood (routine x 2)     Status: None (Preliminary result)   Collection Time:  10/01/15  8:54 PM  Result Value Ref Range Status   Specimen Description LEFT ANTECUBITAL  Final   Special Requests BOTTLES DRAWN AEROBIC AND ANAEROBIC 5CC  Final   Culture   Final    NO GROWTH < 24 HOURS Performed at [facility]    Report Status PENDING  Incomplete     Medications:   . amLODipine  10 mg Oral Daily  . ampicillin-sulbactam (UNASYN) IV  1.5 g Intravenous Q6H  . citalopram  20 mg Oral QPM  . enoxaparin (LOVENOX) injection  40 mg Subcutaneous Q24H  . famotidine  20 mg Oral BID  . fluticasone  1 spray Each Nare Daily  . irbesartan  75 mg Oral Daily  . loratadine  10 mg Oral Daily  . montelukast  10 mg  Oral Daily  . pantoprazole  40 mg Oral Daily  . rosuvastatin  5 mg Oral QHS  . vancomycin  1,250 mg Intravenous Q12H   Continuous Infusions:   Time spent: 25 min   LOS: 1 day   Charlynne Cousins  [facility] Pager [phone]  *Please refer to [email or web address], password [email or web address] to get updated schedule on who will round on this patient, as hospitalists switch teams weekly. If 7PM-7AM, please contact night-coverage at [email or web address], password [email or web address] for any overnight needs.  10/03/2015, 10:59 AM

## 2015-10-04 DIAGNOSIS — R1310 Dysphagia, unspecified: Secondary | ICD-10-CM

## 2015-10-04 MED ORDER — SULFAMETHOXAZOLE-TRIMETHOPRIM 800-160 MG PO TABS
1.0000 | ORAL_TABLET | Freq: Two times a day (BID) | ORAL | Status: DC
Start: 2015-10-04 — End: 2015-10-05
  Administered 2015-10-04 – 2015-10-05 (×3): 1 via ORAL
  Filled 2015-10-04 (×3): qty 1

## 2015-10-04 NOTE — Progress Notes (Signed)
[facility] PROGRESS NOTE    Progress Note  KURTUS VIOLET  [id number] DOB: November 11, 1970 DOA: 10/01/2015 PCP: Chesley Noon, MD     Brief Narrative:   Thomas Ramos is an 45 y.o. male past medical history of essential hypertension. She'll stricture cervical palsy, recently discharged from the hospital on 09/22/2012 was noncompliant with his antibiotics when he went home came in with worsening leg pain and erythema.  Assessment/Plan:   Cellulitis of left leg De-escalate abx to bactrim Continues to remain afebrile.  Community-acquired pneumonia: Will transition abx to bactrim..  Congenital cerebral palsy and dysphagia: He requested a Dys3  Essential hypertension: No changes made to his medication. Blood pressure seems to be controlled.  Depression and anxiety: Continue current home regimen no changes are made.  Stable asthma: Continue Singulair.  Chronic diastolic congestive heart failure: Seems to be compensated continue current regimen.  DVT prophylaxis: lovenox Family Communication:none Disposition Plan/Barrier to D/C: home in am Code Status:     Code Status Orders        Start     Ordered   10/01/15 2002  Full code   Continuous     10/01/15 2002    Code Status History    Date Active Date Inactive Code Status Order ID Comments User Context   09/20/2015 11:43 PM 09/23/2015  5:24 PM Full Code [id number]  [name], MD ED   05/25/2014  1:20 AM 05/28/2014 11:17 PM Full Code [id number]  Allyne Gee, MD Inpatient        IV Access:    Peripheral IV   Procedures and diagnostic studies:   No results found.   Medical Consultants:    None.  Anti-Infectives:   Vancomycin and Unasyn.  Subjective:    MY SIDEL left leg not pain full. Wants regular foods.  Objective:    Filed Vitals:   10/03/15 0503 10/03/15 1500 10/03/15 2200 10/04/15 0621  BP: 115/64 123/88 125/85 109/68  Pulse: 73 95 79 75  Temp: 98.3 F (36.8 C) 97.8 F  (36.6 C) 98.1 F (36.7 C) 98.1 F (36.7 C)  TempSrc: Oral Oral Oral Oral  Resp: 16 12 16 18   Height:      Weight:      SpO2: 98% 99% 100% 100%    Intake/Output Summary (Last 24 hours) at 10/04/15 0758 Last data filed at 10/04/15 0622  Gross per 24 hour  Intake    480 ml  Output   1625 ml  Net  -1145 ml   Filed Weights   10/02/15 0553  Weight: 78.926 kg (174 lb)    Exam: General exam: In no acute distress. Respiratory system: Good air movement and clear to auscultation. Cardiovascular system: S1 & S2 heard, RRR. No JVD. Gastrointestinal system: Abdomen is nondistended, soft and nontender.  Central nervous system: Alert and oriented. No focal neurological deficits. Extremities: No pedal edema. Skin: Erythema improved, not tender to palpation. Psychiatry: Judgement and insight appear normal. Mood & affect appropriate.    Data Reviewed:    Labs: Basic Metabolic Panel:  Recent Labs Lab 10/01/15 1557 10/02/15 0506  NA 137 137  K 3.8 4.1  CL 103 106  CO2 26 26  GLUCOSE 111* 95  BUN 15 10  CREATININE 0.47* 0.62  CALCIUM 8.9 8.8*   GFR Estimated Creatinine Clearance: 103.7 mL/min (by C-G formula based on Cr of 0.62). Liver Function Tests: No results for input(s): AST, ALT, ALKPHOS, BILITOT, PROT, ALBUMIN in the last 168  hours. No results for input(s): LIPASE, AMYLASE in the last 168 hours. No results for input(s): AMMONIA in the last 168 hours. Coagulation profile No results for input(s): INR, PROTIME in the last 168 hours.  CBC:  Recent Labs Lab 10/01/15 1557 10/02/15 0506  WBC 7.5 5.9  NEUTROABS 4.6  --   HGB 12.5* 11.6*  HCT 37.7* 34.7*  MCV 82.3 83.2  PLT 273 245   Cardiac Enzymes: No results for input(s): CKTOTAL, CKMB, CKMBINDEX, TROPONINI in the last 168 hours. BNP (last 3 results) No results for input(s): PROBNP in the last 8760 hours. CBG: No results for input(s): GLUCAP in the last 168 hours. D-Dimer: No results for input(s): DDIMER  in the last 72 hours. Hgb A1c: No results for input(s): HGBA1C in the last 72 hours. Lipid Profile: No results for input(s): CHOL, HDL, LDLCALC, TRIG, CHOLHDL, LDLDIRECT in the last 72 hours. Thyroid function studies: No results for input(s): TSH, T4TOTAL, T3FREE, THYROIDAB in the last 72 hours.  Invalid input(s): FREET3 Anemia work up: No results for input(s): VITAMINB12, FOLATE, FERRITIN, TIBC, IRON, RETICCTPCT in the last 72 hours. Sepsis Labs:  Recent Labs Lab 10/01/15 1557 10/01/15 1824 10/02/15 0506  WBC 7.5  --  5.9  LATICACIDVEN  --  0.93  --    Microbiology Recent Results (from the past 240 hour(s))  Culture, blood (routine x 2)     Status: None (Preliminary result)   Collection Time: 10/01/15  8:54 PM  Result Value Ref Range Status   Specimen Description BLOOD LEFT HAND  Final   Special Requests BOTTLES DRAWN AEROBIC ONLY 5CC  Final   Culture   Final    NO GROWTH 2 DAYS Performed at [facility]    Report Status PENDING  Incomplete  Culture, blood (routine x 2)     Status: None (Preliminary result)   Collection Time: 10/01/15  8:54 PM  Result Value Ref Range Status   Specimen Description LEFT ANTECUBITAL  Final   Special Requests BOTTLES DRAWN AEROBIC AND ANAEROBIC 5CC  Final   Culture   Final    NO GROWTH 2 DAYS Performed at [facility]    Report Status PENDING  Incomplete     Medications:   . amLODipine  10 mg Oral Daily  . ampicillin-sulbactam (UNASYN) IV  1.5 g Intravenous Q6H  . citalopram  20 mg Oral QPM  . enoxaparin (LOVENOX) injection  40 mg Subcutaneous Q24H  . famotidine  20 mg Oral BID  . fluticasone  1 spray Each Nare Daily  . irbesartan  75 mg Oral Daily  . loratadine  10 mg Oral Daily  . montelukast  10 mg Oral Daily  . pantoprazole  40 mg Oral Daily  . rosuvastatin  5 mg Oral QHS  . vancomycin  1,250 mg Intravenous Q12H   Continuous Infusions:   Time spent: 15 min   LOS: 2 days   Charlynne Cousins  [facility] Pager [phone]  *Please refer to [email or web address], password [email or web address] to get updated schedule on who will round on this patient, as hospitalists switch teams weekly. If 7PM-7AM, please contact night-coverage at [email or web address], password [email or web address] for any overnight needs.  10/04/2015, 7:58 AM

## 2015-10-04 NOTE — Care Management Note (Signed)
Case Management Note  Patient Details  Name: Thomas Ramos MRN: [id number] Date of Birth: 03/29/1970  Subjective/Objective:   [facility] spoke to Dr. Ledell Peoples will deliver meds to patient's home if meds escribed early before 10-11a tel#[phone],fax#[phone]. PTAR for non emergency transp home.Patient will have caregivers available once home.                 Action/Plan:d/c home by PTAR.   Expected Discharge Date:   (unknown)               Expected Discharge Plan:  Home/Self Care  In-House Referral:     Discharge planning Services  CM Consult  Post Acute Care Choice:  Durable Medical Equipment (w/c,hoyer lift,hospital bed) Choice offered to:     DME Arranged:    DME Agency:     HH Arranged:    HH Agency:     Status of Service:  In process, will continue to follow  If discussed at Long Length of Stay Meetings, dates discussed:    Additional Comments:  Dessa Phi, RN 10/04/2015, 2:53 PM

## 2015-10-05 MED ORDER — SULFAMETHOXAZOLE-TRIMETHOPRIM 800-160 MG PO TABS: 1.0000 | ORAL_TABLET | Freq: Two times a day (BID) | ORAL | Status: DC

## 2015-10-05 NOTE — Discharge Summary (Signed)
Physician Discharge Summary  Thomas Ramos [id number] DOB: 08/09/1970 DOA: 10/01/2015  PCP: Chesley Noon, MD  Admit date: 10/01/2015 Discharge date: 10/05/2015  Time spent: 35 minutes  Recommendations for Outpatient Follow-up:  1. Follow-up with primary care doctor as needed.   Discharge Diagnoses:  Principal Problem:   Cellulitis of left leg Active Problems:   Dysphagia   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HCC)   HTN (hypertension)   Asthma   GERD (gastroesophageal reflux disease)   Community acquired pneumonia   Depression with anxiety   Chronic diastolic (congestive) heart failure ([place])   Discharge Condition: stable  Diet recommendation: regular  Filed Weights   10/02/15 0553 10/04/15 1700 10/05/15 0640  Weight: 78.926 kg (174 lb) 74.118 kg (163 lb 6.4 oz) 74.11 kg (163 lb 6.1 oz)    History of present illness:  45 year old with past medical history of hypertension depression and anxiety who was recently discharged from the hospital for left lower extremity cellulitis and pneumonia who did not complete his antibiotic treatment comes in for left lower extremity cellulitis  Hospital Course:  Left lower extremity cellulitis: He was started empirically on IV vancomycin he defervesced and his erythema improved, he was changed to Bactrim and monitor for 24 hours which he remained afebrile. Lower extremity Doppler was negative for DVT.  Community-acquired pneumonia: He completed his treatment in house.  Congenital cerebral palsy and dysphagia: He requested a Dys3  Essential hypertension: No changes made to his medication. Blood pressure seems to be controlled.  Depression and anxiety: Continue current home regimen no changes are made.  Stable asthma: Continue Singulair.  Chronic diastolic congestive heart failure: Seems to be compensated continue current regimen.  Procedures: Lower extremity Doppler negative for DVT Chest x-ray   Consultations:  A&O  x3  Discharge Exam: Filed Vitals:   10/04/15 2022 10/05/15 0640  BP: 110/68 111/65  Pulse: 99 99  Temp: 98.2 F (36.8 C) 98.1 F (36.7 C)  Resp: 18 18    General:A&O x3 Cardiovascular: RRR Respiratory: good air movement CTA B/L  Discharge Instructions   Discharge Instructions    Diet - low sodium heart healthy    Complete by:  As directed      Increase activity slowly    Complete by:  As directed           Current Discharge Medication List    CONTINUE these medications which have CHANGED   Details  sulfamethoxazole-trimethoprim (BACTRIM DS,SEPTRA DS) 800-160 MG tablet Take 1 tablet by mouth 2 (two) times daily. Qty: 10 tablet, Refills: 0      CONTINUE these medications which have NOT CHANGED   Details  albuterol (PROVENTIL HFA;VENTOLIN HFA) 108 (90 Base) MCG/ACT inhaler Inhale 1-2 puffs into the lungs every 6 (six) hours as needed for wheezing or shortness of breath. Qty: 1 Inhaler, Refills: 0    amLODipine (NORVASC) 10 MG tablet Take 10 mg by mouth daily.    baclofen (LIORESAL) 10 MG tablet Take 10 mg by mouth 3 (three) times daily as needed. For neck stiffness/pain. Refills: 1    busPIRone (BUSPAR) 7.5 MG tablet Take 7.5 mg by mouth 2 (two) times daily as needed (for anxiousness).     cetirizine (ZYRTEC) 10 MG tablet Take 10 mg by mouth daily.     citalopram (CELEXA) 20 MG tablet Take 20 mg by mouth every evening.    CRESTOR 5 MG tablet Take 5 mg by mouth at bedtime. Refills: 5    fluticasone (FLONASE) 50 MCG/ACT  nasal spray Place 1 spray into both nostrils daily.     HYDROcodone-acetaminophen (NORCO) 5-325 MG tablet Take 1 tablet by mouth every 4 (four) hours as needed for moderate pain. Qty: 15 tablet, Refills: 0    montelukast (SINGULAIR) 10 MG tablet Take 10 mg by mouth daily.    nitroGLYCERIN (NITROSTAT) 0.4 MG SL tablet Place 0.4 mg under the tongue every 5 (five) minutes as needed. For chest pain. Refills: 1    omeprazole (PRILOSEC) 20 MG  capsule Take 1 capsule (20 mg total) by mouth daily. Qty: 30 capsule, Refills: 0    ranitidine (ZANTAC) 150 MG tablet Take 150 mg by mouth 2 (two) times daily as needed. For heartburn. Refills: 3    valsartan (DIOVAN) 80 MG tablet Take 80 mg by mouth daily. Refills: 3      STOP taking these medications     levofloxacin (LEVAQUIN) 750 MG tablet        Allergies  Allergen Reactions  . Metronidazole Nausea And Vomiting  . Penicillins Hives and Nausea And Vomiting    Has patient had a PCN reaction causing immediate rash, facial/tongue/throat swelling, SOB or lightheadedness with hypotension: yes Has patient had a PCN reaction causing severe rash involving mucus membranes or skin necrosis: no Has patient had a PCN reaction that required hospitalization: no Has patient had a PCN reaction occurring within the last 10 years: no If all of the above answers are "NO", then may proceed with Cephalosporin use.    Follow-up Information    Follow up with BADGER,MICHAEL C, MD In 2 days.   Specialty:  Family Medicine   Contact information:   [place] [address] [phone]        The results of significant diagnostics from this hospitalization (including imaging, microbiology, ancillary and laboratory) are listed below for reference.    Significant Diagnostic Studies: Dg Chest 2 View  10/01/2015  CLINICAL DATA:  Chest pain.  Recent pneumonia EXAM: CHEST  2 VIEW COMPARISON:  Chest radiograph Aug 07, 2015 and chest CT September 20, 2015 FINDINGS: There is stable elevation of the right hemidiaphragm. There is no appreciable edema or consolidation. Heart is mildly enlarged with pulmonary vascularity within normal limits. No adenopathy. There is degenerative change in the thoracic spine. IMPRESSION: Stable elevation the right hemidiaphragm. Stable cardiac silhouette. No edema or consolidation. Electronically Signed   By: Lowella Grip III M.D.   On: 10/01/2015 15:47   Ct  Angio Chest Pe W/cm &/or Wo Cm  09/20/2015  CLINICAL DATA:  Acute onset of left lower extremity swelling and dyspnea. Tachypnea and tachycardia. Initial encounter. EXAM: CT ANGIOGRAPHY CHEST WITH CONTRAST TECHNIQUE: Multidetector CT imaging of the chest was performed using the standard protocol during bolus administration of intravenous contrast. Multiplanar CT image reconstructions and MIPs were obtained to evaluate the vascular anatomy. CONTRAST:  80 mL of Isovue 370 IV contrast COMPARISON:  CTA of the chest performed 08/11/2015, and chest radiograph performed 08/07/2015 FINDINGS: There is no evidence of pulmonary embolus. Mild patchy focal airspace opacification is noted at the right upper lobe, compatible with pneumonia. Evaluation is somewhat suboptimal due to motion artifact. There is no evidence of pleural effusion or pneumothorax. No masses are identified; no abnormal focal contrast enhancement is seen. A moderate to large hiatal hernia is noted. There is wall thickening along the distal esophagus, which may reflect chronic inflammation or possibly esophagitis. The mediastinum is otherwise unremarkable. No mediastinal lymphadenopathy is seen. No pericardial effusion  is identified. The great vessels are grossly unremarkable in appearance. No axillary lymphadenopathy is seen. The visualized portions of the thyroid gland are unremarkable in appearance. The visualized portions of the liver and spleen are unremarkable. No acute osseous abnormalities are seen. Review of the MIP images confirms the above findings. IMPRESSION: 1. No evidence of pulmonary embolus. 2. Right upper lobe pneumonia noted. 3. Moderate to large hiatal hernia seen. 4. Wall thickening along the distal esophagus, which may reflect chronic inflammation or possibly esophagitis. Electronically Signed   By: Garald Balding M.D.   On: 09/20/2015 22:32    Microbiology: Recent Results (from the past 240 hour(s))  Culture, blood (routine x 2)      Status: None (Preliminary result)   Collection Time: 10/01/15  8:54 PM  Result Value Ref Range Status   Specimen Description BLOOD LEFT HAND  Final   Special Requests BOTTLES DRAWN AEROBIC ONLY 5CC  Final   Culture   Final    NO GROWTH 3 DAYS Performed at [facility]    Report Status PENDING  Incomplete  Culture, blood (routine x 2)     Status: None (Preliminary result)   Collection Time: 10/01/15  8:54 PM  Result Value Ref Range Status   Specimen Description LEFT ANTECUBITAL  Final   Special Requests BOTTLES DRAWN AEROBIC AND ANAEROBIC 5CC  Final   Culture   Final    NO GROWTH 3 DAYS Performed at [facility]    Report Status PENDING  Incomplete     Labs: Basic Metabolic Panel:  Recent Labs Lab 10/01/15 1557 10/02/15 0506  NA 137 137  K 3.8 4.1  CL 103 106  CO2 26 26  GLUCOSE 111* 95  BUN 15 10  CREATININE 0.47* 0.62  CALCIUM 8.9 8.8*   Liver Function Tests: No results for input(s): AST, ALT, ALKPHOS, BILITOT, PROT, ALBUMIN in the last 168 hours. No results for input(s): LIPASE, AMYLASE in the last 168 hours. No results for input(s): AMMONIA in the last 168 hours. CBC:  Recent Labs Lab 10/01/15 1557 10/02/15 0506  WBC 7.5 5.9  NEUTROABS 4.6  --   HGB 12.5* 11.6*  HCT 37.7* 34.7*  MCV 82.3 83.2  PLT 273 245   Cardiac Enzymes: No results for input(s): CKTOTAL, CKMB, CKMBINDEX, TROPONINI in the last 168 hours. BNP: BNP (last 3 results)  Recent Labs  06/30/15 2322 07/08/15 0023 10/01/15 2054  BNP 12.3 9.2 34.5    ProBNP (last 3 results) No results for input(s): PROBNP in the last 8760 hours.  CBG: No results for input(s): GLUCAP in the last 168 hours.   Signed:  Charlynne Cousins MD.  [facility] 10/05/2015, 10:45 AM

## 2015-10-06 ENCOUNTER — Inpatient Hospital Stay (HOSPITAL_COMMUNITY)
Admission: EM | Admit: 2015-10-06 | Discharge: 2015-10-10 | DRG: 682 | Disposition: A | Discharge status: Home / Self Care | Payer: Medicare Other | Attending: Internal Medicine | Admitting: Internal Medicine

## 2015-10-06 ENCOUNTER — Encounter (HOSPITAL_COMMUNITY): Payer: Self-pay | Admitting: Family Medicine

## 2015-10-06 DIAGNOSIS — Z87891 Personal history of nicotine dependence: Secondary | ICD-10-CM

## 2015-10-06 DIAGNOSIS — R112 Nausea with vomiting, unspecified: Secondary | ICD-10-CM | POA: Diagnosis present

## 2015-10-06 DIAGNOSIS — I110 Hypertensive heart disease with heart failure: Secondary | ICD-10-CM | POA: Diagnosis present

## 2015-10-06 DIAGNOSIS — Z8249 Family history of ischemic heart disease and other diseases of the circulatory system: Secondary | ICD-10-CM

## 2015-10-06 DIAGNOSIS — D72829 Elevated white blood cell count, unspecified: Secondary | ICD-10-CM | POA: Diagnosis present

## 2015-10-06 DIAGNOSIS — Y92009 Unspecified place in unspecified non-institutional (private) residence as the place of occurrence of the external cause: Secondary | ICD-10-CM

## 2015-10-06 DIAGNOSIS — J45909 Unspecified asthma, uncomplicated: Secondary | ICD-10-CM | POA: Diagnosis present

## 2015-10-06 DIAGNOSIS — I10 Essential (primary) hypertension: Secondary | ICD-10-CM | POA: Diagnosis present

## 2015-10-06 DIAGNOSIS — K219 Gastro-esophageal reflux disease without esophagitis: Secondary | ICD-10-CM | POA: Diagnosis present

## 2015-10-06 DIAGNOSIS — R21 Rash and other nonspecific skin eruption: Secondary | ICD-10-CM | POA: Diagnosis present

## 2015-10-06 DIAGNOSIS — Z833 Family history of diabetes mellitus: Secondary | ICD-10-CM

## 2015-10-06 DIAGNOSIS — E871 Hypo-osmolality and hyponatremia: Secondary | ICD-10-CM | POA: Diagnosis present

## 2015-10-06 DIAGNOSIS — R1115 Cyclical vomiting syndrome unrelated to migraine: Secondary | ICD-10-CM

## 2015-10-06 DIAGNOSIS — N179 Acute kidney failure, unspecified: Secondary | ICD-10-CM

## 2015-10-06 DIAGNOSIS — F329 Major depressive disorder, single episode, unspecified: Secondary | ICD-10-CM | POA: Diagnosis present

## 2015-10-06 DIAGNOSIS — G800 Spastic quadriplegic cerebral palsy: Secondary | ICD-10-CM | POA: Diagnosis present

## 2015-10-06 DIAGNOSIS — L03116 Cellulitis of left lower limb: Secondary | ICD-10-CM

## 2015-10-06 DIAGNOSIS — R1310 Dysphagia, unspecified: Secondary | ICD-10-CM | POA: Diagnosis present

## 2015-10-06 DIAGNOSIS — F419 Anxiety disorder, unspecified: Secondary | ICD-10-CM | POA: Diagnosis present

## 2015-10-06 DIAGNOSIS — G809 Cerebral palsy, unspecified: Secondary | ICD-10-CM | POA: Diagnosis present

## 2015-10-06 DIAGNOSIS — Z880 Allergy status to penicillin: Secondary | ICD-10-CM

## 2015-10-06 DIAGNOSIS — Z91048 Other nonmedicinal substance allergy status: Secondary | ICD-10-CM

## 2015-10-06 DIAGNOSIS — Z801 Family history of malignant neoplasm of trachea, bronchus and lung: Secondary | ICD-10-CM

## 2015-10-06 DIAGNOSIS — E119 Type 2 diabetes mellitus without complications: Secondary | ICD-10-CM | POA: Diagnosis present

## 2015-10-06 DIAGNOSIS — Z79899 Other long term (current) drug therapy: Secondary | ICD-10-CM

## 2015-10-06 DIAGNOSIS — Z888 Allergy status to other drugs, medicaments and biological substances status: Secondary | ICD-10-CM

## 2015-10-06 DIAGNOSIS — F418 Other specified anxiety disorders: Secondary | ICD-10-CM | POA: Diagnosis present

## 2015-10-06 DIAGNOSIS — I5032 Chronic diastolic (congestive) heart failure: Secondary | ICD-10-CM | POA: Diagnosis present

## 2015-10-06 DIAGNOSIS — T370X5A Adverse effect of sulfonamides, initial encounter: Secondary | ICD-10-CM | POA: Diagnosis present

## 2015-10-06 LAB — CULTURE, BLOOD (ROUTINE X 2)
CULTURE: NO GROWTH
Culture: NO GROWTH

## 2015-10-06 LAB — CBC WITH DIFFERENTIAL/PLATELET
BASOS PCT: 0 %
Basophils Absolute: 0 10*3/uL (ref 0.0–0.1)
EOS ABS: 0 10*3/uL (ref 0.0–0.7)
EOS PCT: 0 %
HCT: 40.8 % (ref 39.0–52.0)
Hemoglobin: 13.3 g/dL (ref 13.0–17.0)
Lymphocytes Relative: 7 %
Lymphs Abs: 1.2 10*3/uL (ref 0.7–4.0)
MCH: 27.1 pg (ref 26.0–34.0)
MCHC: 32.6 g/dL (ref 30.0–36.0)
MCV: 83.3 fL (ref 78.0–100.0)
MONOS PCT: 4 %
Monocytes Absolute: 0.7 10*3/uL (ref 0.1–1.0)
NEUTROS PCT: 89 %
Neutro Abs: 14.5 10*3/uL — ABNORMAL HIGH (ref 1.7–7.7)
PLATELETS: 306 10*3/uL (ref 150–400)
RBC: 4.9 MIL/uL (ref 4.22–5.81)
RDW: 13.2 % (ref 11.5–15.5)
WBC: 16.4 10*3/uL — AB (ref 4.0–10.5)

## 2015-10-06 LAB — BASIC METABOLIC PANEL
Anion gap: 12 (ref 5–15)
BUN: 27 mg/dL — AB (ref 6–20)
CO2: 20 mmol/L — ABNORMAL LOW (ref 22–32)
CREATININE: 1.15 mg/dL (ref 0.61–1.24)
Calcium: 9.4 mg/dL (ref 8.9–10.3)
Chloride: 102 mmol/L (ref 101–111)
Glucose, Bld: 172 mg/dL — ABNORMAL HIGH (ref 65–99)
Potassium: 4.4 mmol/L (ref 3.5–5.1)
SODIUM: 134 mmol/L — AB (ref 135–145)

## 2015-10-06 LAB — CBG MONITORING, ED: Glucose-Capillary: 267 mg/dL — ABNORMAL HIGH (ref 65–99)

## 2015-10-06 MED ORDER — BUSPIRONE HCL 15 MG PO TABS
7.5000 mg | ORAL_TABLET | Freq: Two times a day (BID) | ORAL | Status: DC | PRN
  Filled 2015-10-06: qty 1

## 2015-10-06 MED ORDER — ENOXAPARIN SODIUM 40 MG/0.4ML ~~LOC~~ SOLN
40.0000 mg | Freq: Every day | SUBCUTANEOUS | Status: DC
  Administered 2015-10-07 – 2015-10-09 (×3): 40 mg via SUBCUTANEOUS
  Filled 2015-10-06 (×4): qty 0.4

## 2015-10-06 MED ORDER — PANTOPRAZOLE SODIUM 40 MG PO TBEC
40.0000 mg | DELAYED_RELEASE_TABLET | Freq: Every day | ORAL | Status: DC
  Administered 2015-10-07 – 2015-10-10 (×4): 40 mg via ORAL
  Filled 2015-10-06 (×4): qty 1

## 2015-10-06 MED ORDER — ACETAMINOPHEN 650 MG RE SUPP
650.0000 mg | Freq: Four times a day (QID) | RECTAL | Status: DC | PRN
Start: 2015-10-06 — End: 2015-10-10

## 2015-10-06 MED ORDER — LORATADINE 10 MG PO TABS
10.0000 mg | ORAL_TABLET | Freq: Every day | ORAL | Status: DC
  Administered 2015-10-07 – 2015-10-10 (×4): 10 mg via ORAL
  Filled 2015-10-06 (×4): qty 1

## 2015-10-06 MED ORDER — FAMOTIDINE 20 MG PO TABS
20.0000 mg | ORAL_TABLET | Freq: Two times a day (BID) | ORAL | Status: DC
  Administered 2015-10-07 – 2015-10-10 (×8): 20 mg via ORAL
  Filled 2015-10-06 (×8): qty 1

## 2015-10-06 MED ORDER — SODIUM CHLORIDE 0.9 % IV SOLN: INTRAVENOUS | Status: DC

## 2015-10-06 MED ORDER — SODIUM CHLORIDE 0.9 % IV BOLUS (SEPSIS)
500.0000 mL | Freq: Once | INTRAVENOUS | Status: AC
  Administered 2015-10-06: 500 mL via INTRAVENOUS

## 2015-10-06 MED ORDER — CLINDAMYCIN PHOSPHATE 600 MG/50ML IV SOLN
600.0000 mg | Freq: Three times a day (TID) | INTRAVENOUS | Status: DC
  Administered 2015-10-07 – 2015-10-10 (×10): 600 mg via INTRAVENOUS
  Filled 2015-10-06 (×11): qty 50

## 2015-10-06 MED ORDER — CEFAZOLIN IN D5W 1 GM/50ML IV SOLN
1.0000 g | Freq: Once | INTRAVENOUS | Status: DC
  Administered 2015-10-06: 1 g via INTRAVENOUS
  Filled 2015-10-06: qty 50

## 2015-10-06 MED ORDER — BISACODYL 5 MG PO TBEC: 5.0000 mg | DELAYED_RELEASE_TABLET | Freq: Every day | ORAL | Status: DC | PRN

## 2015-10-06 MED ORDER — HYDROCODONE-ACETAMINOPHEN 5-325 MG PO TABS: 1.0000 | ORAL_TABLET | ORAL | Status: DC | PRN

## 2015-10-06 MED ORDER — BACLOFEN 10 MG PO TABS: 10.0000 mg | ORAL_TABLET | Freq: Three times a day (TID) | ORAL | Status: DC | PRN

## 2015-10-06 MED ORDER — ONDANSETRON HCL 4 MG PO TABS: 4.0000 mg | ORAL_TABLET | Freq: Four times a day (QID) | ORAL | Status: DC | PRN

## 2015-10-06 MED ORDER — FLUTICASONE PROPIONATE 50 MCG/ACT NA SUSP
1.0000 | Freq: Every day | NASAL | Status: DC
  Administered 2015-10-07 – 2015-10-10 (×4): 1 via NASAL
  Filled 2015-10-06: qty 16

## 2015-10-06 MED ORDER — ONDANSETRON HCL 4 MG/2ML IJ SOLN: 4.0000 mg | Freq: Four times a day (QID) | INTRAMUSCULAR | Status: DC | PRN

## 2015-10-06 MED ORDER — ROSUVASTATIN CALCIUM 10 MG PO TABS
5.0000 mg | ORAL_TABLET | Freq: Every day | ORAL | Status: DC
  Administered 2015-10-07 – 2015-10-09 (×4): 5 mg via ORAL
  Filled 2015-10-06 (×4): qty 1

## 2015-10-06 MED ORDER — SODIUM CHLORIDE 0.9 % IV SOLN
INTRAVENOUS | Status: AC
  Administered 2015-10-07: 02:00:00 via INTRAVENOUS

## 2015-10-06 MED ORDER — MONTELUKAST SODIUM 10 MG PO TABS
10.0000 mg | ORAL_TABLET | Freq: Every day | ORAL | Status: DC
  Administered 2015-10-07 – 2015-10-10 (×4): 10 mg via ORAL
  Filled 2015-10-06 (×4): qty 1

## 2015-10-06 MED ORDER — ALBUTEROL SULFATE (2.5 MG/3ML) 0.083% IN NEBU: 2.5000 mg | INHALATION_SOLUTION | Freq: Four times a day (QID) | RESPIRATORY_TRACT | Status: DC | PRN

## 2015-10-06 MED ORDER — CITALOPRAM HYDROBROMIDE 20 MG PO TABS
20.0000 mg | ORAL_TABLET | Freq: Every evening | ORAL | Status: DC
  Administered 2015-10-07 – 2015-10-09 (×3): 20 mg via ORAL
  Filled 2015-10-06 (×3): qty 1

## 2015-10-06 MED ORDER — NITROGLYCERIN 0.4 MG SL SUBL: 0.4000 mg | SUBLINGUAL_TABLET | SUBLINGUAL | Status: DC | PRN

## 2015-10-06 MED ORDER — ONDANSETRON HCL 4 MG/2ML IJ SOLN
4.0000 mg | Freq: Once | INTRAMUSCULAR | Status: AC
  Administered 2015-10-06: 4 mg via INTRAVENOUS
  Filled 2015-10-06: qty 2

## 2015-10-06 MED ORDER — SULFAMETHOXAZOLE-TRIMETHOPRIM 400-80 MG/5ML IV SOLN
5.0000 mg/kg | Freq: Once | INTRAVENOUS | Status: DC
  Filled 2015-10-06: qty 23.2

## 2015-10-06 MED ORDER — POLYETHYLENE GLYCOL 3350 17 G PO PACK: 17.0000 g | PACK | Freq: Every day | ORAL | Status: DC | PRN

## 2015-10-06 MED ORDER — ACETAMINOPHEN 325 MG PO TABS
650.0000 mg | ORAL_TABLET | Freq: Four times a day (QID) | ORAL | Status: DC | PRN
  Administered 2015-10-07: 650 mg via ORAL
  Filled 2015-10-06: qty 2

## 2015-10-06 NOTE — ED Provider Notes (Signed)
[facility] DEPT Provider Note   CSN: [id number] Arrival date & time: 10/06/15  2006  First Provider Contact:  None       History   Chief Complaint Chief Complaint  Patient presents with  . Nausea  . Emesis    HPI ADAIAH SEARL is a 45 y.o. male.  44 year old male with history of diastolic heart failure, pneumonia, cerebral palsy, reflux, high blood pressure presents with vomiting and left skin rash. Patient was recently admitted and discharged to [facility] for cellulitis. Patient had IV followed by oral antibiotics and tolerated them. Patient denies any recent fevers or chills. Patient thinks possibly vomiting related to Bactrim unsure. Nonbloody emesis. Patient had ultrasound done with no blood clot in the hospital.   The history is provided by the patient.  Emesis   Pertinent negatives include no abdominal pain, no chills, no fever and no headaches.    Past Medical History:  Diagnosis Date  . Anxiety   . Cerebral palsy ([place])   . Chronic diastolic (congestive) heart failure ([place])   . CP (cerebral palsy) ([place])   . Diabetes mellitus without complication (HCC)    borderline  . Environmental allergies    takes inhalers if needed  . Esophageal stricture   . GERD (gastroesophageal reflux disease)   . Hypertension   . Motility disorder, esophageal   . Pneumonia   . 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 S/P Botox injection    approx every 4 months  . Seasonal allergies     Patient Active Problem List   Diagnosis Date Noted  . Chronic diastolic (congestive) heart failure ([place])   . Leg swelling   . Community acquired pneumonia 09/20/2015  . Depression with anxiety 09/20/2015  . Cellulitis of left leg 09/20/2015  . CAP (community acquired pneumonia) 09/20/2015  . Hypersomnia 07/16/2015  . Dyspnea 07/16/2015  . Asthma 07/16/2015  . GERD (gastroesophageal reflux disease) 07/16/2015  . Cerebral palsy ([place]) 07/16/2015  . Hematemesis with nausea   . Influenza  with pneumonia   . Aspiration pneumonia ([place]) 05/24/2014  . HTN (hypertension) 05/24/2014  . Fever 05/24/2014  . Dysphagia 09/16/2010  . 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 BOTOX INJECTION N/A 12/21/2012   Procedure: BOTOX INJECTION;  Surgeon: Inda Castle, MD;  Location: [facility];  Service: Endoscopy;  Laterality: N/A;  . BOTOX INJECTION N/A 06/28/2014   Procedure: BOTOX INJECTION;  Surgeon: Inda Castle, MD;  Location: [facility];  Service: Endoscopy;  Laterality: N/A;  . ESOPHAGOGASTRODUODENOSCOPY N/A 12/21/2012   Procedure: ESOPHAGOGASTRODUODENOSCOPY (EGD);  Surgeon: Inda Castle, MD;  Location: [facility];  Service: Endoscopy;  Laterality: N/A;  . ESOPHAGOGASTRODUODENOSCOPY N/A 06/28/2014   Procedure: ESOPHAGOGASTRODUODENOSCOPY (EGD);  Surgeon: Inda Castle, MD;  Location: [facility];  Service: Endoscopy;  Laterality: N/A;  . ESOPHAGUS SURGERY     stretched esophagus  . legs    . MOUTH SURGERY    . TENDON RELEASE         Home Medications    Prior to Admission medications   Medication Sig Start Date End Date Taking? Authorizing Provider  albuterol (PROVENTIL HFA;VENTOLIN HFA) 108 (90 Base) MCG/ACT inhaler Inhale 1-2 puffs into the lungs every 6 (six) hours as needed for wheezing or shortness of breath. 08/26/15  Yes Ripley Fraise, MD  amLODipine (NORVASC) 10 MG tablet Take 10 mg by mouth daily.   Yes Historical Provider, MD  baclofen (  LIORESAL) 10 MG tablet Take 10 mg by mouth 3 (three) times daily as needed. For neck stiffness/pain. 08/30/15  Yes Historical Provider, MD  busPIRone (BUSPAR) 7.5 MG tablet Take 7.5 mg by mouth 2 (two) times daily as needed (for anxiousness).    Yes Historical Provider, MD  cetirizine (ZYRTEC) 10 MG tablet Take 10 mg by mouth daily.  03/01/14  Yes Historical Provider, MD  citalopram (CELEXA) 20 MG tablet Take 20 mg by mouth every evening.   Yes Historical Provider,  MD  CRESTOR 5 MG tablet Take 5 mg by mouth at bedtime. 04/22/14  Yes Historical Provider, MD  fluticasone (FLONASE) 50 MCG/ACT nasal spray Place 1 spray into both nostrils daily.  03/01/14  Yes Historical Provider, MD  HYDROcodone-acetaminophen (NORCO) 5-325 MG tablet Take 1 tablet by mouth every 4 (four) hours as needed for moderate pain. 0000000  Yes Delora Fuel, MD  montelukast (SINGULAIR) 10 MG tablet Take 10 mg by mouth daily.   Yes Historical Provider, MD  nitroGLYCERIN (NITROSTAT) 0.4 MG SL tablet Place 0.4 mg under the tongue every 5 (five) minutes as needed. For chest pain. 08/26/15  Yes Historical Provider, MD  omeprazole (PRILOSEC) 20 MG capsule Take 1 capsule (20 mg total) by mouth daily. 08/11/15  Yes April Palumbo, MD  ranitidine (ZANTAC) 150 MG tablet Take 150 mg by mouth 2 (two) times daily as needed. For heartburn. 08/26/15  Yes Historical Provider, MD  sulfamethoxazole-trimethoprim (BACTRIM DS,SEPTRA DS) 800-160 MG tablet Take 1 tablet by mouth 2 (two) times daily. 10/05/15  Yes Charlynne Cousins, MD  valsartan (DIOVAN) 80 MG tablet Take 80 mg by mouth daily. 08/26/15  Yes Historical Provider, MD    Family History Family History  Problem Relation Age of Onset  . Hypertension Father   . Lung cancer Father   . Diabetes Mother     Social History Social History  Substance Use Topics  . Smoking status: Former Research scientist (life sciences)  . Smokeless tobacco: Former Systems developer     Comment: smoked one time  . Alcohol use 0.0 oz/week     Comment: 2 beers every other Friday and Saturday     Allergies   Metronidazole and Penicillins   Review of Systems Review of Systems  Constitutional: Positive for appetite change. Negative for chills and fever.  HENT: Negative for congestion.   Eyes: Negative for visual disturbance.  Respiratory: Negative for shortness of breath.   Cardiovascular: Negative for chest pain.  Gastrointestinal: Positive for vomiting. Negative for abdominal pain.  Genitourinary:  Negative for dysuria and flank pain.  Musculoskeletal: Negative for back pain, neck pain and neck stiffness.  Skin: Positive for rash.  Neurological: Negative for light-headedness and headaches.     Physical Exam Updated Vital Signs BP 117/62   Pulse 79   Temp 98.2 F (36.8 C) (Rectal)   Resp 22   Ht 5' (1.524 m)   Wt 163 lb (73.9 kg)   SpO2 97%   BMI 31.83 kg/m   Physical Exam  Constitutional: He is oriented to person, place, and time. He appears well-developed and well-nourished.  HENT:  Head: Normocephalic and atraumatic.  Dry mucous membranes  Eyes: Conjunctivae are normal. Right eye exhibits no discharge. Left eye exhibits no discharge.  Neck: Normal range of motion. Neck supple. No tracheal deviation present.  Cardiovascular: Normal rate and regular rhythm.   Pulmonary/Chest: Effort normal and breath sounds normal.  Abdominal: Soft. He exhibits no distension. There is no tenderness. There is no guarding.  Musculoskeletal: He exhibits edema (minimal bilateral lower extremities).  CP  Neurological: He is alert and oriented to person, place, and time.  Cerebral palsy  Skin: Skin is warm. Rash noted.  Erythema left anterior lowe leg   Nursing note and vitals reviewed.    ED Treatments / Results  Labs (all labs ordered are listed, but only abnormal results are displayed) Labs Reviewed  CBC WITH DIFFERENTIAL/PLATELET - Abnormal; Notable for the following:       Result Value   WBC 16.4 (*)    Neutro Abs 14.5 (*)    All other components within normal limits  BASIC METABOLIC PANEL - Abnormal; Notable for the following:    Sodium 134 (*)    CO2 20 (*)    Glucose, Bld 172 (*)    BUN 27 (*)    All other components within normal limits  CBG MONITORING, ED - Abnormal; Notable for the following:    Glucose-Capillary 267 (*)    All other components within normal limits    EKG  EKG Interpretation None       Radiology No results  found.  Procedures Procedures (including critical care time)  Medications Ordered in ED Medications  0.9 %  sodium chloride infusion (not administered)  sodium chloride 0.9 % bolus 500 mL (0 mLs Intravenous Stopped 10/06/15 2308)  ondansetron (ZOFRAN) injection 4 mg (4 mg Intravenous Given 10/06/15 2220)     Initial Impression / Assessment and Plan / ED Course  I have reviewed the triage vital signs and the nursing notes.  Pertinent labs & imaging results that were available during my care of the patient were reviewed by me and considered in my medical decision making (see chart for details).  Clinical Course   Patient presents with vomiting decreased appetite and recent admission for cellulitis. Patient  Final Clinical Impressions(s) / ED Diagnoses   Final diagnoses:  Left leg cellulitis  Acute renal failure, unspecified acute renal failure type ([place])  Non-intractable cyclical vomiting with nausea    New Prescriptions New Prescriptions   No medications on file     Elnora Morrison, MD 10/06/15 2337

## 2015-10-06 NOTE — ED Triage Notes (Signed)
Per EMS: pt coming from home with c/o n/v. Pt has hx of cerebral palsy, onset about a hour prior to arrival. Pt was given 4 mg zofran en route. Pt was d/c from [facility] yesterday for infection on left leg. Pt diaphoretic, cool to the touch, unknown fever at home.

## 2015-10-07 DIAGNOSIS — F418 Other specified anxiety disorders: Secondary | ICD-10-CM | POA: Diagnosis not present

## 2015-10-07 DIAGNOSIS — F329 Major depressive disorder, single episode, unspecified: Secondary | ICD-10-CM | POA: Diagnosis present

## 2015-10-07 DIAGNOSIS — I5032 Chronic diastolic (congestive) heart failure: Secondary | ICD-10-CM | POA: Diagnosis present

## 2015-10-07 DIAGNOSIS — Z801 Family history of malignant neoplasm of trachea, bronchus and lung: Secondary | ICD-10-CM | POA: Diagnosis not present

## 2015-10-07 DIAGNOSIS — E871 Hypo-osmolality and hyponatremia: Secondary | ICD-10-CM

## 2015-10-07 DIAGNOSIS — Z8249 Family history of ischemic heart disease and other diseases of the circulatory system: Secondary | ICD-10-CM | POA: Diagnosis not present

## 2015-10-07 DIAGNOSIS — N179 Acute kidney failure, unspecified: Secondary | ICD-10-CM | POA: Diagnosis present

## 2015-10-07 DIAGNOSIS — L03116 Cellulitis of left lower limb: Secondary | ICD-10-CM | POA: Diagnosis present

## 2015-10-07 DIAGNOSIS — Z833 Family history of diabetes mellitus: Secondary | ICD-10-CM | POA: Diagnosis not present

## 2015-10-07 DIAGNOSIS — R112 Nausea with vomiting, unspecified: Secondary | ICD-10-CM | POA: Diagnosis present

## 2015-10-07 DIAGNOSIS — J45909 Unspecified asthma, uncomplicated: Secondary | ICD-10-CM | POA: Diagnosis present

## 2015-10-07 DIAGNOSIS — R1110 Vomiting, unspecified: Secondary | ICD-10-CM

## 2015-10-07 DIAGNOSIS — Z91048 Other nonmedicinal substance allergy status: Secondary | ICD-10-CM | POA: Diagnosis not present

## 2015-10-07 DIAGNOSIS — G43A0 Cyclical vomiting, not intractable: Secondary | ICD-10-CM | POA: Diagnosis not present

## 2015-10-07 DIAGNOSIS — K219 Gastro-esophageal reflux disease without esophagitis: Secondary | ICD-10-CM

## 2015-10-07 DIAGNOSIS — E119 Type 2 diabetes mellitus without complications: Secondary | ICD-10-CM | POA: Diagnosis present

## 2015-10-07 DIAGNOSIS — Y92009 Unspecified place in unspecified non-institutional (private) residence as the place of occurrence of the external cause: Secondary | ICD-10-CM | POA: Diagnosis not present

## 2015-10-07 DIAGNOSIS — T370X5A Adverse effect of sulfonamides, initial encounter: Secondary | ICD-10-CM | POA: Diagnosis present

## 2015-10-07 DIAGNOSIS — Z79899 Other long term (current) drug therapy: Secondary | ICD-10-CM | POA: Diagnosis not present

## 2015-10-07 DIAGNOSIS — R1310 Dysphagia, unspecified: Secondary | ICD-10-CM | POA: Diagnosis present

## 2015-10-07 DIAGNOSIS — Z888 Allergy status to other drugs, medicaments and biological substances status: Secondary | ICD-10-CM | POA: Diagnosis not present

## 2015-10-07 DIAGNOSIS — Z87891 Personal history of nicotine dependence: Secondary | ICD-10-CM | POA: Diagnosis not present

## 2015-10-07 DIAGNOSIS — F419 Anxiety disorder, unspecified: Secondary | ICD-10-CM | POA: Diagnosis present

## 2015-10-07 DIAGNOSIS — G800 Spastic quadriplegic cerebral palsy: Secondary | ICD-10-CM | POA: Diagnosis present

## 2015-10-07 DIAGNOSIS — Z880 Allergy status to penicillin: Secondary | ICD-10-CM | POA: Diagnosis not present

## 2015-10-07 DIAGNOSIS — R21 Rash and other nonspecific skin eruption: Secondary | ICD-10-CM | POA: Diagnosis present

## 2015-10-07 DIAGNOSIS — I110 Hypertensive heart disease with heart failure: Secondary | ICD-10-CM | POA: Diagnosis present

## 2015-10-07 LAB — GLUCOSE, CAPILLARY: GLUCOSE-CAPILLARY: 101 mg/dL — AB (ref 65–99)

## 2015-10-07 LAB — BASIC METABOLIC PANEL
Anion gap: 6 (ref 5–15)
BUN: 22 mg/dL — ABNORMAL HIGH (ref 6–20)
CALCIUM: 8.9 mg/dL (ref 8.9–10.3)
CO2: 25 mmol/L (ref 22–32)
Chloride: 103 mmol/L (ref 101–111)
Creatinine, Ser: 0.84 mg/dL (ref 0.61–1.24)
GFR calc Af Amer: >60 mL/min (ref >60)
GLUCOSE: 118 mg/dL — AB (ref 65–99)
POTASSIUM: 4 mmol/L (ref 3.5–5.1)
Sodium: 134 mmol/L — ABNORMAL LOW (ref 135–145)

## 2015-10-07 LAB — NA AND K (SODIUM & POTASSIUM), RAND UR
POTASSIUM UR: 117 mmol/L
Sodium, Ur: 72 mmol/L

## 2015-10-07 LAB — CREATININE, URINE, RANDOM: CREATININE, URINE: 260.73 mg/dL

## 2015-10-07 LAB — PROCALCITONIN: PROCALCITONIN: 0.22 ng/mL

## 2015-10-07 MED ORDER — CETYLPYRIDINIUM CHLORIDE 0.05 % MT LIQD
7.0000 mL | Freq: Two times a day (BID) | OROMUCOSAL | Status: DC
  Administered 2015-10-07 – 2015-10-10 (×5): 7 mL via OROMUCOSAL

## 2015-10-07 NOTE — Progress Notes (Signed)
Thomas Ramos is a 45 y.o. male with medical history significant for cerebral palsy with spastic quadriplegia, GERD, hypertension, chronic diastolic CHF, and seasonal allergies who presents the emergency department with nausea and intractable vomiting,  After being discharged on 7/22. He was readmitted and antibiotics changed to IV.  Resume IV clindamycin, and get SLP evaluation.  His symptoms of nausea and vomiting are better.  No diarrhea.  Discussed with the patient's advocate at bedside.   Please see Dr Criss Rosales note for detailed H&P as he was admitted earlier today.  Hosie Poisson, md [phone]

## 2015-10-07 NOTE — Progress Notes (Signed)
New Admission Note: Pt transferred from [facility] to room 6E21  Arrival Method: Via stretcher Mental Orientation: Alert and Oriented Telemetry: N/A Assessment: Completed Skin: Intact IV: NS @ 100 Pain: Denies Tubes: None Safety Measures: Safety Fall Prevention Plan has been discussed  Admission: To be completed 6 [place] Orientation: Patient has been orientated to the room, unit and staff.  Family:  None at bedside  Orders to be reviewed and implemented. Will continue to monitor the patient. Call light has been placed within reach and bed alarm has been activated.   Mady Gemma, BSN, RN-BC Phone: [phone]

## 2015-10-07 NOTE — Evaluation (Addendum)
Clinical/Bedside Swallow Evaluation Patient Details  Name: Thomas Ramos MRN: [id number] Date of Birth: Jun 17, 1970  Today's Date: 10/07/2015 Time: SLP Start Time (ACUTE ONLY): 1435 SLP Stop Time (ACUTE ONLY): 1450 SLP Time Calculation (min) (ACUTE ONLY): 15 min  Past Medical History:  Past Medical History:  Diagnosis Date  . Anxiety   . Cerebral palsy ([place])   . Chronic diastolic (congestive) heart failure ([place])   . CP (cerebral palsy) ([place])   . Diabetes mellitus without complication (HCC)    borderline  . Environmental allergies    takes inhalers if needed  . Esophageal stricture   . GERD (gastroesophageal reflux disease)   . Hypertension   . Motility disorder, esophageal   . Pneumonia   . 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 S/P Botox injection    approx every 4 months  . Seasonal allergies    Past Surgical History:  Past Surgical History:  Procedure Laterality Date  . BOTOX INJECTION N/A 12/21/2012   Procedure: BOTOX INJECTION;  Surgeon: Inda Castle, MD;  [facility];  Service: Endoscopy;  Laterality: N/A;  . BOTOX INJECTION N/A 06/28/2014   Procedure: BOTOX INJECTION;  Surgeon: Inda Castle, MD;  Location: [facility];  Service: Endoscopy;  Laterality: N/A;  . ESOPHAGOGASTRODUODENOSCOPY N/A 12/21/2012   Procedure: ESOPHAGOGASTRODUODENOSCOPY (EGD);  Surgeon: Inda Castle, MD;  Location: [facility];  Service: Endoscopy;  Laterality: N/A;  . ESOPHAGOGASTRODUODENOSCOPY N/A 06/28/2014   Procedure: ESOPHAGOGASTRODUODENOSCOPY (EGD);  Surgeon: Inda Castle, MD;  Location: [facility];  Service: Endoscopy;  Laterality: N/A;  . ESOPHAGUS SURGERY     stretched esophagus  . legs    . MOUTH SURGERY    . TENDON RELEASE     HPI:  Pt is a 45 y.o. male with PMH of cerebral palsy with spastic quadriplegia, GERD, hypertension, chronic diastolic CHF, and seasonal allergies who presents the emergency department with nausea and intractable vomiting, onset 7/23. Pt  was admitted 2 times earlier this month for cellulitis and PNA. Pt also with hx of achalasia and attempts at stretching esophagus in the past. Pt had MBS 05/2014 which showed flash penetration of thin liquids during and after swallow (residuals, reduced tongue base retraction). Bedside swallow eval ordered.    Assessment / Plan / Recommendation Clinical Impression  Pt with no overt s/s of aspiration at bedside. Pt reported no difficulty during meals and reported eating a regular texture diet at home. Reviewed MBS results from 05/2014 with pt- during that study, pt had flash penetration of thin liquids and vallecular residue across consistencies with the recommendation to swallow 2x with each bolus. Reviewed this with pt who needed regular cues to swallow 2x during PO trials. RN reported that pt has been tolerating dysphagia 3 diet well without overt s/s of aspiration. Recommend continuing dysphagia 3 diet, thin liquids, meds whole with liquid, full supervision to assist with self-feeding, cue small bites/ sips, cue pt to swallow 2x to clear residuals, along with reflux precautions (hx of achalasia)- ensure pt as upright as possible and upright 60 minutes after meals. Will f/u at least x1 to review compensatory strategies/ education.    Aspiration Risk  Mild aspiration risk    Diet Recommendation Dysphagia 3 (Mech soft);Thin liquid   Liquid Administration via: Straw Medication Administration: Whole meds with liquid Supervision: Full supervision/cueing for compensatory strategies;Staff to assist with self feeding Compensations: Slow rate;Small sips/bites;Multiple dry swallows after each bite/sip Postural Changes: Seated upright at 90 degrees;Remain upright for at  least 30 minutes after po intake    Other  Recommendations Oral Care Recommendations: Oral care BID Other Recommendations: Clarify dietary restrictions   Follow up Recommendations  24 hour supervision/assistance    Frequency and Duration  min 1 x/week  1 week       Prognosis Prognosis for Safe Diet Advancement: Fair Barriers to Reach Goals: Other (Comment) (hx of dysphagia)      Swallow Study   General HPI: Pt is a 45 y.o. male with PMH of cerebral palsy with spastic quadriplegia, GERD, hypertension, chronic diastolic CHF, and seasonal allergies who presents the emergency department with nausea and intractable vomiting, onset 7/23. Pt was admitted 2 times earlier this month for cellulitis and PNA. Pt also with hx of achalasia and attempts at stretching esophagus in the past. Pt had MBS 05/2014 which showed flash penetration of thin liquids during and after swallow (residuals, reduced tongue base retraction). Bedside swallow eval ordered.  Type of Study: Bedside Swallow Evaluation Previous Swallow Assessment: MBS March 2016- D2/ thin (vallecula residuals, flash penetration of thin liquids) Diet Prior to this Study: Dysphagia 3 (soft);Thin liquids Temperature Spikes Noted: No Respiratory Status: Room air History of Recent Intubation: No Behavior/Cognition: Alert;Cooperative;Pleasant mood Oral Cavity Assessment: Within Functional Limits Oral Care Completed by SLP: No Oral Cavity - Dentition: Adequate natural dentition Vision: Functional for self-feeding Self-Feeding Abilities: Needs assist Patient Positioning: Upright in bed Baseline Vocal Quality: Normal Volitional Cough: Strong Volitional Swallow: Able to elicit    Oral/Motor/Sensory Function Overall Oral Motor/Sensory Function: Within functional limits   Ice Chips Ice chips: Not tested   Thin Liquid Thin Liquid: Within functional limits Presentation: Straw    Nectar Thick Nectar Thick Liquid: Not tested   Honey Thick Honey Thick Liquid: Not tested   Puree Puree: Within functional limits Presentation: Self Fed;Spoon   Solid   GO   Solid: Within functional limits Presentation: Self Fed    Functional Assessment Tool Used: clinical judgment Functional  Limitations: Swallowing Swallow Current Status [id number]): At least 1 percent but less than 20 percent impaired, limited or restricted Swallow Goal Status [phone]): At least 1 percent but less than 20 percent impaired, limited or restricted   Kern Reap, MA, CCC-SLP 10/07/2015,2:56 PM [phone]

## 2015-10-07 NOTE — Evaluation (Signed)
Physical Therapy Evaluation Patient Details Name: Thomas Ramos MRN: [id number] DOB: Sep 13, 1970 Today's Date: 10/07/2015   History of Present Illness  Patient is a 45 y/o male with hx of cerebral palsy with spastic quadriplegia, GERD, hypertension, chronic diastolic CHF, and seasonal allergies presents with nausea/vomting. Pt admitted to the hospital twice already this month, initially for left lower extremity cellulitis and PNA recently d/ced 7/22. Being treated for nausea and vomiting with AKI  Clinical Impression  Patient presents with hx of CP with spastic quadiplegia, swelling/redness LLE and impaired mobility due to above. At baseline, pt requires total A for all care and uses hoyer lift to transfer to power chair. Pt reports he will now have 24/7 supervision/assist at home at discharge due to needing more help. Pt attempted to sit EOB with this therapist however required total A due to spasticity and heavy lean. Pt is functioning at baseline and does not require further skilled therapy services. Recommend hoyer lift to chair while in hospital. Discharge from therapy.    Follow Up Recommendations Supervision/Assistance - 24 hour;Supervision for mobility/OOB    Equipment Recommendations  None recommended by PT    Recommendations for Other Services OT consult     Precautions / Restrictions Precautions Precaution Comments: spastic quadriplegia Restrictions Weight Bearing Restrictions: No      Mobility  Bed Mobility Overal bed mobility: Needs Assistance Bed Mobility: Supine to Sit;Sit to Supine     Supine to sit: Total assist;HOB elevated Sit to supine: Total assist;HOB elevated   General bed mobility comments: Assist to bring BLEs to EOB and to elevate trunk and to return BLEs to supine. Pt with heavy posterior lean. Able to help scoot self up in bed using rail and LUE.  Transfers                    Ambulation/Gait                Stairs             Wheelchair Mobility    Modified Rankin (Stroke Patients Only)       Balance Overall balance assessment: Needs assistance Sitting-balance support: Feet unsupported;No upper extremity supported Sitting balance-Leahy Scale: Zero Sitting balance - Comments: Total A with heavy posterior lean most likely due to tone/spasticity. Postural control: Posterior lean                                   Pertinent Vitals/Pain Pain Assessment: No/denies pain    Home Living Family/patient expects to be discharged to:: Private residence Living Arrangements: Alone Available Help at Discharge: Friend(s);Personal care attendant Type of Home: House Home Access: Ramped entrance     Home Layout: One level Home Equipment: Wheelchair - power;Hospital bed      Prior Function Level of [place]: Needs assistance   Gait / Transfers Assistance Needed: hoyer lift to chair  ADL's / Homemaking Assistance Needed: total A for ADls.  Comments: Pt reports his PCA, "his staff" are there from 3:30-9 and from 7-9 am and then pt goes to work.     Hand Dominance        Extremity/Trunk Assessment   Upper Extremity Assessment: Defer to OT evaluation           Lower Extremity Assessment:  (spastic quadriplegia BLEs. Not able to move through PROM. Swelling, redness, erythema LLE.)         Communication  Communication: No difficulties  Cognition Arousal/Alertness: Awake/alert Behavior During Therapy: WFL for tasks assessed/performed Overall Cognitive Status: Within Functional Limits for tasks assessed                      General Comments      Exercises        Assessment/Plan    PT Assessment Patent does not need any further PT services  PT Diagnosis Generalized weakness;Quadraplegia   PT Problem List    PT Treatment Interventions     PT Goals (Current goals can be found in the Care Plan section) Acute Rehab PT Goals Patient Stated Goal: to get this  infection out of my leg PT Goal Formulation: With patient Time For Goal Achievement: 10/21/15 Potential to Achieve Goals: Fair    Frequency     Barriers to discharge        Co-evaluation               End of Session   Activity Tolerance: Patient tolerated treatment well Patient left: in bed;with call bell/phone within reach;with bed alarm set Nurse Communication: Mobility status;Need for lift equipment         Time: 1531-1557 PT Time Calculation (min) (ACUTE ONLY): 26 min   Charges:   PT Evaluation $PT Eval High Complexity: 1 Procedure PT Treatments $Therapeutic Activity: 8-22 mins   PT G Codes:        Jazilyn Siegenthaler A Jacoria Keiffer 10/07/2015, 4:08 PM  Wray [place], [place], DPT [phone]

## 2015-10-07 NOTE — Care Management Obs Status (Signed)
[facility] NOTIFICATION   Patient Details  Name: Thomas Ramos MRN: [id number] Date of Birth: [birth year]   Medicare Observation Status Notification Given:  Yes    Keymoni Mccaster, Rory Percy, RN 10/07/2015, 4:39 PM

## 2015-10-07 NOTE — H&P (Signed)
History and Physical    Thomas Ramos UDJ:[id number] DOB: [birth year] DOA: 10/06/2015  PCP: Chesley Noon, MD   Patient coming from: Home (group home)   Chief Complaint: Nausea, vomiting   HPI: Thomas Ramos is a 45 y.o. male with medical history significant for cerebral palsy with spastic quadriplegia, GERD, hypertension, chronic diastolic CHF, and seasonal allergies who presents the emergency department with nausea and intractable vomiting, onset today. Patient was admitted to the hospital twice already this month, initially for left lower extremity cellulitis and pneumonia, and again for worsening in the cellulitis after failing to take his antibiotics upon discharge. He was actually just discharged from the hospital yesterday with Bactrim for cellulitis, but reports developing severe nausea and intractable vomiting since returning home. He denies any fevers or chills, denies dyspnea or new cough, and denies abdominal pain or diarrhea. Patient reports that attempts at oral intake at home were met with immediate vomiting, so he has come to the emergency department to seek care.    ED Course: Upon arrival to the ED, patient is found to be afebrile, saturating well on room air, and with vital signs stable. Chemistry panels notable for mild hyponatremia, BUN 27, and serum creatinine 1.15, up from 0.62 just three days prior. CBC is notable for a leukocytosis to 16,400, up from 5,900 three days earlier. A 500 mL normal saline bolus was administered in the emergency department and symptomatic care was provided with Zofran. Bactrim has been ordered but not yet administered, and will be changed given the patient's AKI. He'll be observed on the medical-surgical unit for ongoing evaluation and management of nausea and vomiting with AKI.  Review of Systems:  All other systems reviewed and apart from HPI, are negative.  Past Medical History:  Diagnosis Date  . Anxiety   . Cerebral palsy ([place])   . Chronic  diastolic (congestive) heart failure ([place])   . CP (cerebral palsy) ([place])   . Diabetes mellitus without complication (HCC)    borderline  . Environmental allergies    takes inhalers if needed  . Esophageal stricture   . GERD (gastroesophageal reflux disease)   . Hypertension   . Motility disorder, esophageal   . Pneumonia   . 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[place])   . S/P Botox injection    approx every 4 months  . Seasonal allergies     Past Surgical History:  Procedure Laterality Date  . BOTOX INJECTION N/A 12/21/2012   Procedure: BOTOX INJECTION;  Surgeon: Inda Castle, MD;  Location: [facility];  Service: Endoscopy;  Laterality: N/A;  . BOTOX INJECTION N/A 06/28/2014   Procedure: BOTOX INJECTION;  Surgeon: Inda Castle, MD;  Location: [facility];  Service: Endoscopy;  Laterality: N/A;  . ESOPHAGOGASTRODUODENOSCOPY N/A 12/21/2012   Procedure: ESOPHAGOGASTRODUODENOSCOPY (EGD);  Surgeon: Inda Castle, MD;  Location: [facility];  Service: Endoscopy;  Laterality: N/A;  . ESOPHAGOGASTRODUODENOSCOPY N/A 06/28/2014   Procedure: ESOPHAGOGASTRODUODENOSCOPY (EGD);  Surgeon: Inda Castle, MD;  Location: [facility];  Service: Endoscopy;  Laterality: N/A;  . ESOPHAGUS SURGERY     stretched esophagus  . legs    . MOUTH SURGERY    . TENDON RELEASE       reports that he has quit smoking. He has quit using smokeless tobacco. He reports that he drinks alcohol. He reports that he does not use drugs.  Allergies  Allergen Reactions  . Metronidazole Nausea And Vomiting  . Penicillins Hives and Nausea And Vomiting  Has patient had a PCN reaction causing immediate rash, facial/tongue/throat swelling, SOB or lightheadedness with hypotension: yes Has patient had a PCN reaction causing severe rash involving mucus membranes or skin necrosis: no Has patient had a PCN reaction that required hospitalization: no Has patient had a PCN reaction occurring within the last 10 years: no If all  of the above answers are "NO", then may proceed with Cephalosporin use.     Family History  Problem Relation Age of Onset  . Hypertension Father   . Lung cancer Father   . Diabetes Mother      Prior to Admission medications   Medication Sig Start Date End Date Taking? Authorizing Provider  albuterol (PROVENTIL HFA;VENTOLIN HFA) 108 (90 Base) MCG/ACT inhaler Inhale 1-2 puffs into the lungs every 6 (six) hours as needed for wheezing or shortness of breath. 08/26/15  Yes Ripley Fraise, MD  amLODipine (NORVASC) 10 MG tablet Take 10 mg by mouth daily.   Yes Historical Provider, MD  baclofen (LIORESAL) 10 MG tablet Take 10 mg by mouth 3 (three) times daily as needed. For neck stiffness/pain. 08/30/15  Yes Historical Provider, MD  busPIRone (BUSPAR) 7.5 MG tablet Take 7.5 mg by mouth 2 (two) times daily as needed (for anxiousness).    Yes Historical Provider, MD  cetirizine (ZYRTEC) 10 MG tablet Take 10 mg by mouth daily.  03/01/14  Yes Historical Provider, MD  citalopram (CELEXA) 20 MG tablet Take 20 mg by mouth every evening.   Yes Historical Provider, MD  CRESTOR 5 MG tablet Take 5 mg by mouth at bedtime. 04/22/14  Yes Historical Provider, MD  fluticasone (FLONASE) 50 MCG/ACT nasal spray Place 1 spray into both nostrils daily.  03/01/14  Yes Historical Provider, MD  HYDROcodone-acetaminophen (NORCO) 5-325 MG tablet Take 1 tablet by mouth every 4 (four) hours as needed for moderate pain. 0/98/11  Yes Delora Fuel, MD  montelukast (SINGULAIR) 10 MG tablet Take 10 mg by mouth daily.   Yes Historical Provider, MD  nitroGLYCERIN (NITROSTAT) 0.4 MG SL tablet Place 0.4 mg under the tongue every 5 (five) minutes as needed. For chest pain. 08/26/15  Yes Historical Provider, MD  omeprazole (PRILOSEC) 20 MG capsule Take 1 capsule (20 mg total) by mouth daily. 08/11/15  Yes April Palumbo, MD  ranitidine (ZANTAC) 150 MG tablet Take 150 mg by mouth 2 (two) times daily as needed. For heartburn. 08/26/15  Yes  Historical Provider, MD  sulfamethoxazole-trimethoprim (BACTRIM DS,SEPTRA DS) 800-160 MG tablet Take 1 tablet by mouth 2 (two) times daily. 10/05/15  Yes Charlynne Cousins, MD  valsartan (DIOVAN) 80 MG tablet Take 80 mg by mouth daily. 08/26/15  Yes Historical Provider, MD    Physical Exam: Vitals:   10/06/15 2200 10/06/15 2215 10/06/15 2230 10/06/15 2308  BP: 113/62 120/72 117/62   Pulse: 94 96 79   Resp: _0 Temp:      TempSrc:      SpO2: 97% 96% 97%   Weight:    73.9 kg (163 lb)  Height:    5' (1.524 m)      Constitutional: NAD, calm, comfortable; sweaty  Eyes: PERTLA, lids and conjunctivae normal ENMT: Mucous membranes are moist. Posterior pharynx clear of any exudate or lesions.   Neck: normal, supple, no masses, no thyromegaly Respiratory: Coarse upper airway sounds. Normal respiratory effort. No accessory muscle use.  Cardiovascular: S1 & S2 heard, regular rate and rhythm. 2+ pedal pulses. No carotid bruits. No significant JVD. Abdomen:  No distension, no tenderness, no masses palpated. Bowel sounds normal.  Musculoskeletal: no clubbing / cyanosis. Flexion contractures. LLE edema. Normal muscle tone.  Skin: Erythema, warmth, and edema of distal LLE. Warm, dry, well-perfused. Neurologic: CN 2-12 grossly intact. Sensation intact. PERRL, EOMI.    Psychiatric: Normal judgment and insight. Alert and oriented x 3. Normal mood and affect.     Labs on Admission: I have personally reviewed following labs and imaging studies  CBC:  Recent Labs Lab 10/01/15 1557 10/02/15 0506 10/06/15 2216  WBC 7.5 5.9 16.4*  NEUTROABS 4.6  --  14.5*  HGB 12.5* 11.6* 13.3  HCT 37.7* 34.7* 40.8  MCV 82.3 83.2 83.3  PLT 273 245 306   Basic Metabolic Panel:  Recent Labs Lab 10/01/15 1557 10/02/15 0506 10/06/15 2216  NA 137 137 134*  K 3.8 4.1 4.4  CL 103 106 102  CO2 26 26 20*  GLUCOSE 111* 95 172*  BUN 15 10 27*  CREATININE 0.47* 0.62 1.15  CALCIUM 8.9 8.8* 9.4    GFR: Estimated Creatinine Clearance: 69.8 mL/min (by C-G formula based on SCr of 1.15 mg/dL). Liver Function Tests: No results for input(s): AST, ALT, ALKPHOS, BILITOT, PROT, ALBUMIN in the last 168 hours. No results for input(s): LIPASE, AMYLASE in the last 168 hours. No results for input(s): AMMONIA in the last 168 hours. Coagulation Profile: No results for input(s): INR, PROTIME in the last 168 hours. Cardiac Enzymes: No results for input(s): CKTOTAL, CKMB, CKMBINDEX, TROPONINI in the last 168 hours. BNP (last 3 results) No results for input(s): PROBNP in the last 8760 hours. HbA1C: No results for input(s): HGBA1C in the last 72 hours. CBG:  Recent Labs Lab 10/06/15 2020  GLUCAP 267*   Lipid Profile: No results for input(s): CHOL, HDL, LDLCALC, TRIG, CHOLHDL, LDLDIRECT in the last 72 hours. Thyroid Function Tests: No results for input(s): TSH, T4TOTAL, FREET4, T3FREE, THYROIDAB in the last 72 hours. Anemia Panel: No results for input(s): VITAMINB12, FOLATE, FERRITIN, TIBC, IRON, RETICCTPCT in the last 72 hours. Urine analysis:    Component Value Date/Time   COLORURINE YELLOW 10/01/2015 1557   APPEARANCEUR CLEAR 10/01/2015 1557   LABSPEC 1.025 10/01/2015 1557   PHURINE 6.0 10/01/2015 1557   GLUCOSEU NEGATIVE 10/01/2015 1557   HGBUR NEGATIVE 10/01/2015 1557   BILIRUBINUR NEGATIVE 10/01/2015 1557   KETONESUR NEGATIVE 10/01/2015 1557   PROTEINUR NEGATIVE 10/01/2015 1557   UROBILINOGEN 1.0 05/26/2014 2211   NITRITE NEGATIVE 10/01/2015 1557   LEUKOCYTESUR NEGATIVE 10/01/2015 1557   Sepsis Labs: @LABRCNTIP(procalcitonin:4,lacticidven:4) ) Recent Results (from the past 240 hour(s))  Culture, blood (routine x 2)     Status: None   Collection Time: 10/01/15  8:54 PM  Result Value Ref Range Status   Specimen Description BLOOD LEFT HAND  Final   Special Requests BOTTLES DRAWN AEROBIC ONLY 5CC  Final   Culture   Final    NO GROWTH 5 DAYS Performed at [facility]    Report Status 10/06/2015 FINAL  Final  Culture, blood (routine x 2)     Status: None   Collection Time: 10/01/15  8:54 PM  Result Value Ref Range Status   Specimen Description LEFT ANTECUBITAL  Final   Special Requests BOTTLES DRAWN AEROBIC AND ANAEROBIC 5CC  Final   Culture   Final    NO GROWTH 5 DAYS Performed at [facility]    Report Status 10/06/2015 FINAL  Final     Radiological Exams on Admission: No results found.    EKG: Not performed, will obtain as appropriate.   Assessment/Plan  1. AKI  - SCr 1.15 on admission, up from 0.62 on 10/02/15  - Prerenal azotemia possible in setting of vomiting and poor oral intake; alternatively, Bactrim could be the culprit; maybe both  - Given a 500 cc NS bolus in ED, will continue IVF with NS  - Avoid nephrotoxins, change Bactrim to clindamycin as below, hold Diovan  - Repeat chem panel tomorrow   2. Nausea, vomiting  - Uncertain etiology, possibly secondary to Bactrim  - Replacing volume losses and correcting lytes  - Antiemetics prn  - Advance diet as tolerated    3. LLE cellulitis  - Had just been readmitted for this d/t his non-adherence with outpatient abx  - Was discharged 1 day prior to the current admission with Bactrim  - Cultures have remained negative; MRSA screen was negative - Given AKI, Bactrim changed to clindamycin   4. Depression, anxiety  - Stable, continue current management with Celexa and Buspar    5. Cerebral palsy  - With spastic quadriplegia managed with Botox injections and baclofen prn  - Continue baclofen as needed    6. Hyponatremia  - Serum sodium 134 on admission in setting of N/V and dehydration  - Anticipate resolution with IVF  - Repeat chem panel in am     DVT prophylaxis: sq Lovenox  Code Status: Full  Family Communication: Discussed with patient  Disposition Plan: Observe on med-surg  Consults called: None  Admission status: Observation     Timothy S Opyd,  MD [facility] Pager [phone]  If 7PM-7AM, please contact night-coverage [email or web address] Password [email or web address]  10/07/2015, 12:05 AM    

## 2015-10-08 LAB — PROCALCITONIN: Procalcitonin: 0.17 ng/mL

## 2015-10-08 LAB — GLUCOSE, CAPILLARY: GLUCOSE-CAPILLARY: 111 mg/dL — AB (ref 65–99)

## 2015-10-08 LAB — UREA NITROGEN, URINE: Urea Nitrogen, Ur: 674 mg/dL

## 2015-10-08 NOTE — Progress Notes (Signed)
Progress Note    Thomas Ramos  [id number] DOB: [birth year]  DOA: 10/06/2015 PCP: Chesley Noon, MD    Brief Narrative:   Thomas Ramos is an 45 y.o. male with medical history significant for cerebral palsy with spastic quadriplegia, GERD, hypertension, chronic diastolic CHF, and seasonal allergies who presents the emergency department with nausea and intractable vomiting, . He was  discharged on 7/22 on bactrim for his cellulitis , but pt reports that he devloped nausea, vomiting after taking the antibiotics and came to ED.   Assessment/Plan:   Principal Problem:   AKI (acute kidney injury) ([place]) Active Problems:   Dysphagia   HTN (hypertension)   GERD (gastroesophageal reflux disease)   Cerebral palsy (HCC)   Depression with anxiety   Cellulitis of left leg   Chronic diastolic (congestive) heart failure (HCC)   Hyponatremia   Leukocytosis   Nausea & vomiting  LEFT lower extremity cellulitis: Admitted on the 7/18 and discharged on 7/22 with bactrim, but pt became intolerant and came to ED.  Currently he is on IV clindamycin, cellulitis hasn't improved much today.  Would continue IV clinda for another 24 hours before changing to po in am .    Chronic diastolic heart failure: Appears to be compensated.   Hypertension: well controlled.   Dysphagia: slp EVAL recommending regular diet.  Cerebral palsy:  no new issues.   GERD: ON pepcid.   Asthma: no wheezing heard.      Family Communication/Anticipated D/C date and plan/Code Status   DVT prophylaxis: Lovenox ordered. Code Status: Full Code.  Family Communication: none at bedside Disposition Plan: pending , possible home tomorrow.    Medical Consultants:    None.   Procedures:    Anti-Infectives:   Anti-infectives    Start     Dose/Rate Route Frequency Ordered Stop   10/07/15 0000  clindamycin (CLEOCIN) IVPB 600 mg     600 mg 100 mL/hr over 30 Minutes Intravenous Every 8 hours 10/06/15  2359     10/06/15 2300  sulfamethoxazole-trimethoprim (BACTRIM) 371.2 mg of trimethoprim in dextrose 5 % 500 mL IVPB  Status:  Discontinued     5 mg/kg of trimethoprim  74.1 kg 348.8 mL/hr over 90 Minutes Intravenous  Once 10/06/15 2247 10/06/15 2329   10/06/15 2215  ceFAZolin (ANCEF) IVPB 1 g/50 mL premix  Status:  Discontinued     1 g 100 mL/hr over 30 Minutes Intravenous  Once 10/06/15 2205 10/06/15 2232      Subjective:    Thomas Ramos reports feeling better, but still very  Nauseated.  Requesting for regular diet.   Objective:    Vitals:   10/08/15 0410 10/08/15 0500 10/08/15 0735 10/08/15 1726  BP: 107/68  (!) 105/54 113/67  Pulse: 81  71 81  Resp: 18  17 18   Temp: 98.1 F (36.7 C)  98.2 F (36.8 C) 97.9 F (36.6 C)  TempSrc: Oral  Oral Oral  SpO2: 98%  99% 99%  Weight:  81.2 kg (179 lb)    Height:        Intake/Output Summary (Last 24 hours) at 10/08/15 1841 Last data filed at 10/08/15 1825  Gross per 24 hour  Intake              600 ml  Output              251 ml  Net  349 ml   Filed Weights   10/06/15 2308 10/07/15 0059 10/08/15 0500  Weight: 73.9 kg (163 lb) 75.1 kg (165 lb 9.6 oz) 81.2 kg (179 lb)    Exam: General exam: Appears calm and comfortable.  Respiratory system: Clear to auscultation. Respiratory effort normal. Cardiovascular system: S1 & S2 heard, RRR. No JVD,  rubs, gallops or clicks. No murmurs. Gastrointestinal system: Abdomen is nondistended, soft and nontender. No organomegaly or masses felt. Normal bowel sounds heard. Central nervous system: appears to be oriented   Extremities: CONTRACTURES of the upper and lower extremities. and redness over the LLE , tenderness persent.   Data Reviewed:   I have personally reviewed following labs and imaging studies:  Labs: Basic Metabolic Panel:  Recent Labs Lab 10/02/15 0506 10/06/15 2216 10/07/15 0400  NA 137 134* 134*  K 4.1 4.4 4.0  CL 106 102 103  CO2 26 20* 25    GLUCOSE 95 172* 118*  BUN 10 27* 22*  CREATININE 0.62 1.15 0.84  CALCIUM 8.8* 9.4 8.9   GFR Estimated Creatinine Clearance: 100.2 mL/min (by C-G formula based on SCr of 0.84 mg/dL). Liver Function Tests: No results for input(s): AST, ALT, ALKPHOS, BILITOT, PROT, ALBUMIN in the last 168 hours. No results for input(s): LIPASE, AMYLASE in the last 168 hours. No results for input(s): AMMONIA in the last 168 hours. Coagulation profile No results for input(s): INR, PROTIME in the last 168 hours.  CBC:  Recent Labs Lab 10/02/15 0506 10/06/15 2216  WBC 5.9 16.4*  NEUTROABS  --  14.5*  HGB 11.6* 13.3  HCT 34.7* 40.8  MCV 83.2 83.3  PLT 245 306   Cardiac Enzymes: No results for input(s): CKTOTAL, CKMB, CKMBINDEX, TROPONINI in the last 168 hours. BNP (last 3 results) No results for input(s): PROBNP in the last 8760 hours. CBG:  Recent Labs Lab 10/06/15 2020 10/07/15 0729 10/08/15 0730  GLUCAP 267* 101* 111*   D-Dimer: No results for input(s): DDIMER in the last 72 hours. Hgb A1c: No results for input(s): HGBA1C in the last 72 hours. Lipid Profile: No results for input(s): CHOL, HDL, LDLCALC, TRIG, CHOLHDL, LDLDIRECT in the last 72 hours. Thyroid function studies: No results for input(s): TSH, T4TOTAL, T3FREE, THYROIDAB in the last 72 hours.  Invalid input(s): FREET3 Anemia work up: No results for input(s): VITAMINB12, FOLATE, FERRITIN, TIBC, IRON, RETICCTPCT in the last 72 hours. Sepsis Labs:  Recent Labs Lab 10/02/15 0506 10/06/15 2216 10/07/15 0400 10/08/15 0547  PROCALCITON  --   --  0.22 0.17  WBC 5.9 16.4*  --   --    Urine analysis:    Component Value Date/Time   COLORURINE YELLOW 10/01/2015 1557   APPEARANCEUR CLEAR 10/01/2015 1557   LABSPEC 1.025 10/01/2015 1557   PHURINE 6.0 10/01/2015 1557   GLUCOSEU NEGATIVE 10/01/2015 1557   HGBUR NEGATIVE 10/01/2015 1557   BILIRUBINUR NEGATIVE 10/01/2015 1557   KETONESUR NEGATIVE 10/01/2015 1557    PROTEINUR NEGATIVE 10/01/2015 1557   UROBILINOGEN 1.0 05/26/2014 2211   NITRITE NEGATIVE 10/01/2015 1557   LEUKOCYTESUR NEGATIVE 10/01/2015 1557   Microbiology Recent Results (from the past 240 hour(s))  Culture, blood (routine x 2)     Status: None   Collection Time: 10/01/15  8:54 PM  Result Value Ref Range Status   Specimen Description BLOOD LEFT HAND  Final   Special Requests BOTTLES DRAWN AEROBIC ONLY 5CC  Final   Culture   Final    NO GROWTH 5 DAYS Performed at [facility]  [facility]    Report Status 10/06/2015 FINAL  Final  Culture, blood (routine x 2)     Status: None   Collection Time: 10/01/15  8:54 PM  Result Value Ref Range Status   Specimen Description LEFT ANTECUBITAL  Final   Special Requests BOTTLES DRAWN AEROBIC AND ANAEROBIC 5CC  Final   Culture   Final    NO GROWTH 5 DAYS Performed at [facility]    Report Status 10/06/2015 FINAL  Final    Radiology: No results found.  Medications:   . antiseptic oral rinse  7 mL Mouth Rinse BID  . citalopram  20 mg Oral QPM  . clindamycin (CLEOCIN) IV  600 mg Intravenous Q8H  . enoxaparin (LOVENOX) injection  40 mg Subcutaneous Daily  . famotidine  20 mg Oral BID  . fluticasone  1 spray Each Nare Daily  . loratadine  10 mg Oral Daily  . montelukast  10 mg Oral Daily  . pantoprazole  40 mg Oral Daily  . rosuvastatin  5 mg Oral QHS   Continuous Infusions:   Time spent: 25 MIN   LOS: 1 day   [facility] Pager [phone]   *Please refer to [email or web address], password [email or web address] to get updated schedule on who will round on this patient, as hospitalists switch teams weekly. If 7PM-7AM, please contact night-coverage at [email or web address], password [email or web address] for any overnight needs.  10/08/2015, 6:41 PM

## 2015-10-08 NOTE — Care Management Important Message (Signed)
Important Message  Patient Details  Name: Thomas Ramos MRN: [id number] Date of Birth: 10-06-1970   Medicare Important Message Given:  Yes    Loann Quill 10/08/2015, 9:50 AM

## 2015-10-08 NOTE — Progress Notes (Signed)
Speech Language Pathology Treatment: Dysphagia  Patient Details Name: Thomas Ramos MRN: [id number] DOB: 01/17/1971 Today's Date: 10/08/2015 Time: 1343-1356 SLP Time Calculation (min) (ACUTE ONLY): 13 min  Assessment / Plan / Recommendation Clinical Impression  F/u after yesterday's swallow assessment.  Pt with mild baseline issues related to mobility and strength of oral musculature, for which he compensates.  Pt consumed regular solids, thin liquids with no s/s of aspiration; he is cognizant of the deliberation required during eating to maximize safety.  He is independent with precautions.  Lung sounds are clear.  Recommend advancing diet to regular, thin liquids.  No SLP f/u is warranted - our services will sign off.    HPI HPI: Pt is a 45 y.o. male with PMH of cerebral palsy with spastic quadriplegia, GERD, hypertension, chronic diastolic CHF, and seasonal allergies who presents the emergency department with nausea and intractable vomiting, onset 7/23. Pt was admitted 2 times earlier this month for cellulitis and PNA. Pt also with hx of achalasia and attempts at stretching esophagus in the past. Pt had MBS 05/2014 which showed flash penetration of thin liquids during and after swallow (residuals, reduced tongue base retraction). Bedside swallow eval ordered.       SLP Plan  All goals met     Recommendations  Diet recommendations: Regular;Thin liquid Liquids provided via: Cup;Straw Medication Administration: Whole meds with liquid Supervision: Patient able to self feed Compensations: Small sips/bites Postural Changes and/or Swallow Maneuvers: Upright 30-60 min after meal             Oral Care Recommendations: Oral care BID Follow up Recommendations: None Plan: All goals met     GO                Couture, Amanda Laurice 10/08/2015, 2:01 PM    

## 2015-10-09 DIAGNOSIS — N179 Acute kidney failure, unspecified: Principal | ICD-10-CM

## 2015-10-09 LAB — BASIC METABOLIC PANEL
Anion gap: 5 (ref 5–15)
BUN: 14 mg/dL (ref 6–20)
CHLORIDE: 106 mmol/L (ref 101–111)
CO2: 25 mmol/L (ref 22–32)
Calcium: 8.7 mg/dL — ABNORMAL LOW (ref 8.9–10.3)
Creatinine, Ser: 0.73 mg/dL (ref 0.61–1.24)
GFR calc Af Amer: >60 mL/min (ref >60)
GFR calc non Af Amer: >60 mL/min (ref >60)
Glucose, Bld: 109 mg/dL — ABNORMAL HIGH (ref 65–99)
POTASSIUM: 3.8 mmol/L (ref 3.5–5.1)
SODIUM: 136 mmol/L (ref 135–145)

## 2015-10-09 LAB — CBC
HEMATOCRIT: 34.4 % — AB (ref 39.0–52.0)
HEMOGLOBIN: 10.8 g/dL — AB (ref 13.0–17.0)
MCH: 26.9 pg (ref 26.0–34.0)
MCHC: 31.4 g/dL (ref 30.0–36.0)
MCV: 85.6 fL (ref 78.0–100.0)
Platelets: 205 10*3/uL (ref 150–400)
RBC: 4.02 MIL/uL — AB (ref 4.22–5.81)
RDW: 13.3 % (ref 11.5–15.5)
WBC: 4.1 10*3/uL (ref 4.0–10.5)

## 2015-10-09 LAB — GLUCOSE, CAPILLARY: GLUCOSE-CAPILLARY: 111 mg/dL — AB (ref 65–99)

## 2015-10-09 NOTE — Progress Notes (Signed)
PROGRESS NOTE                                                                                                                                                                                                             Patient Demographics:    Thomas Ramos, is a 45 y.o. male, DOB - 04/18/1970, [place]:[id number]  Admit date - 10/06/2015   Admitting Physician [name], MD  Outpatient Primary MD for the patient is Chesley Noon, MD  LOS - 2  Outpatient Specialists:None  Chief Complaint  Patient presents with  . Nausea  . Emesis       Brief Narrative   45 year old male with cerebral palsy with spastic quadriplegia, hypertension, chronic diastolic CHF, GERD recently hospitalized from 7/89 and 7/22 with cellulitis of the left leg and discharged on oral Bactrim return to the ED after a delayed nausea with vomiting and unable to take his medications. Placed on IV antibiotics and hydration.   Subjective:   Patient denies any pain. Wants to go home.   Assessment  & Plan :    Principal Problem: Cellulitis of left lower extremity ([place]) Patient was admitted to the hospital from 7/18-7/22 for cellulitis and discharged on Bactrim but due to nausea and vomiting was not able to take his antibiotics and return to the ED. Now on IV clindamycin. Cellulitis appears unchanged compared to yesterday. Will reevaluate in the morning and if improved discharge him home. Remains afebrile. Leukocytosis resolved.   Active Problems:   Dysphagia Seen by speech eval and recommends regular diet.    HTN (hypertension) Stable. Continue home medications  Cerebral palsy Stable.     Depression with anxiety Continue Celexa, BuSpar    Chronic diastolic (congestive) heart failure (HCC)   Euvolemic. Continue statin.      Code Status : Full code  Family Communication  : None at bedside  Disposition Plan  : Home tomorrow if cellulitis  improves  Barriers For Discharge : Active symptoms  Consults  :  None  Procedures  : None  DVT Prophylaxis  :  Lovenox   Lab Results  Component Value Date   PLT 205 10/09/2015    Antibiotics  :   Anti-infectives    Start     Dose/Rate Route Frequency Ordered Stop   10/07/15 0000  clindamycin (CLEOCIN) IVPB 600  mg     600 mg 100 mL/hr over 30 Minutes Intravenous Every 8 hours 10/06/15 2359     10/06/15 2300  sulfamethoxazole-trimethoprim (BACTRIM) 371.2 mg of trimethoprim in dextrose 5 % 500 mL IVPB  Status:  Discontinued     5 mg/kg of trimethoprim  74.1 kg 348.8 mL/hr over 90 Minutes Intravenous  Once 10/06/15 2247 10/06/15 2329   10/06/15 2215  ceFAZolin (ANCEF) IVPB 1 g/50 mL premix  Status:  Discontinued     1 g 100 mL/hr over 30 Minutes Intravenous  Once 10/06/15 2205 10/06/15 2232        Objective:   Vitals:   10/08/15 1726 10/08/15 1954 10/09/15 0522 10/09/15 0831  BP: 113/67 115/62 97/60 131/81  Pulse: 81 81 62 75  Resp: 18 20 18 18   Temp: 97.9 F (36.6 C) 97.2 F (36.2 C) 97.4 F (36.3 C) 98.2 F (36.8 C)  TempSrc: Oral Oral Oral Oral  SpO2: 99% 100% 99% 98%  Weight:  81.8 kg (180 lb 5 oz)    Height:        Wt Readings from Last 3 Encounters:  10/08/15 81.8 kg (180 lb 5 oz)  10/05/15 74.1 kg (163 lb 6.1 oz)  09/21/15 79.7 kg (175 lb 12.8 oz)     Intake/Output Summary (Last 24 hours) at 10/09/15 1426 Last data filed at 10/09/15 0900  Gross per 24 hour  Intake              700 ml  Output              475 ml  Net              225 ml     Physical Exam  Gen: not in distress HEENT:  moist mucosa, supple neck Chest: clear b/l, no added sounds CVS: N S1&S2, no murmurs, GI: soft, NT, ND, BS+ Musculoskeletal: Erythema over left leg with some warmth and minimal tenderness (unchanged from yesterday as per the nurse) CNS: Alert and oriented    Data Review:    CBC  Recent Labs Lab 10/06/15 2216 10/09/15 0406  WBC 16.4* 4.1  HGB 13.3  10.8*  HCT 40.8 34.4*  PLT 306 205  MCV 83.3 85.6  MCH 27.1 26.9  MCHC 32.6 31.4  RDW 13.2 13.3  LYMPHSABS 1.2  --   MONOABS 0.7  --   EOSABS 0.0  --   BASOSABS 0.0  --     Chemistries   Recent Labs Lab 10/06/15 2216 10/07/15 0400 10/09/15 0406  NA 134* 134* 136  K 4.4 4.0 3.8  CL 102 103 106  CO2 20* 25 25  GLUCOSE 172* 118* 109*  BUN 27* 22* 14  CREATININE 1.15 0.84 0.73  CALCIUM 9.4 8.9 8.7*   ------------------------------------------------------------------------------------------------------------------ No results for input(s): CHOL, HDL, LDLCALC, TRIG, CHOLHDL, LDLDIRECT in the last 72 hours.  Lab Results  Component Value Date   HGBA1C 6.2 (H) 05/25/2014   ------------------------------------------------------------------------------------------------------------------ No results for input(s): TSH, T4TOTAL, T3FREE, THYROIDAB in the last 72 hours.  Invalid input(s): FREET3 ------------------------------------------------------------------------------------------------------------------ No results for input(s): VITAMINB12, FOLATE, FERRITIN, TIBC, IRON, RETICCTPCT in the last 72 hours.  Coagulation profile No results for input(s): INR, PROTIME in the last 168 hours.  No results for input(s): DDIMER in the last 72 hours.  Cardiac Enzymes No results for input(s): CKMB, TROPONINI, MYOGLOBIN in the last 168 hours.  Invalid input(s): CK ------------------------------------------------------------------------------------------------------------------    Component Value Date/Time   BNP 34.5 10/01/2015 2054  Inpatient Medications  Scheduled Meds: . antiseptic oral rinse  7 mL Mouth Rinse BID  . citalopram  20 mg Oral QPM  . clindamycin (CLEOCIN) IV  600 mg Intravenous Q8H  . enoxaparin (LOVENOX) injection  40 mg Subcutaneous Daily  . famotidine  20 mg Oral BID  . fluticasone  1 spray Each Nare Daily  . loratadine  10 mg Oral Daily  . montelukast  10  mg Oral Daily  . pantoprazole  40 mg Oral Daily  . rosuvastatin  5 mg Oral QHS   Continuous Infusions:  PRN Meds:.acetaminophen **OR** acetaminophen, albuterol, baclofen, bisacodyl, busPIRone, HYDROcodone-acetaminophen, nitroGLYCERIN, ondansetron **OR** ondansetron (ZOFRAN) IV, polyethylene glycol  Micro Results Recent Results (from the past 240 hour(s))  Culture, blood (routine x 2)     Status: None   Collection Time: 10/01/15  8:54 PM  Result Value Ref Range Status   Specimen Description BLOOD LEFT HAND  Final   Special Requests BOTTLES DRAWN AEROBIC ONLY 5CC  Final   Culture   Final    NO GROWTH 5 DAYS Performed at [facility]    Report Status 10/06/2015 FINAL  Final  Culture, blood (routine x 2)     Status: None   Collection Time: 10/01/15  8:54 PM  Result Value Ref Range Status   Specimen Description LEFT ANTECUBITAL  Final   Special Requests BOTTLES DRAWN AEROBIC AND ANAEROBIC 5CC  Final   Culture   Final    NO GROWTH 5 DAYS Performed at [facility]    Report Status 10/06/2015 FINAL  Final    Radiology Reports Dg Chest 2 View  Result Date: 10/01/2015 CLINICAL DATA:  Chest pain.  Recent pneumonia EXAM: CHEST  2 VIEW COMPARISON:  Chest radiograph Aug 07, 2015 and chest CT September 20, 2015 FINDINGS: There is stable elevation of the right hemidiaphragm. There is no appreciable edema or consolidation. Heart is mildly enlarged with pulmonary vascularity within normal limits. No adenopathy. There is degenerative change in the thoracic spine. IMPRESSION: Stable elevation the right hemidiaphragm. Stable cardiac silhouette. No edema or consolidation. Electronically Signed   By: Lowella Grip III M.D.   On: 10/01/2015 15:47   Ct Angio Chest Pe W/cm &/or Wo Cm  Result Date: 09/20/2015 CLINICAL DATA:  Acute onset of left lower extremity swelling and dyspnea. Tachypnea and tachycardia. Initial encounter. EXAM: CT ANGIOGRAPHY CHEST WITH CONTRAST TECHNIQUE: Multidetector CT  imaging of the chest was performed using the standard protocol during bolus administration of intravenous contrast. Multiplanar CT image reconstructions and MIPs were obtained to evaluate the vascular anatomy. CONTRAST:  80 mL of Isovue 370 IV contrast COMPARISON:  CTA of the chest performed 08/11/2015, and chest radiograph performed 08/07/2015 FINDINGS: There is no evidence of pulmonary embolus. Mild patchy focal airspace opacification is noted at the right upper lobe, compatible with pneumonia. Evaluation is somewhat suboptimal due to motion artifact. There is no evidence of pleural effusion or pneumothorax. No masses are identified; no abnormal focal contrast enhancement is seen. A moderate to large hiatal hernia is noted. There is wall thickening along the distal esophagus, which may reflect chronic inflammation or possibly esophagitis. The mediastinum is otherwise unremarkable. No mediastinal lymphadenopathy is seen. No pericardial effusion is identified. The great vessels are grossly unremarkable in appearance. No axillary lymphadenopathy is seen. The visualized portions of the thyroid gland are unremarkable in appearance. The visualized portions of the liver and spleen are unremarkable. No acute osseous abnormalities are seen. Review of the MIP  images confirms the above findings. IMPRESSION: 1. No evidence of pulmonary embolus. 2. Right upper lobe pneumonia noted. 3. Moderate to large hiatal hernia seen. 4. Wall thickening along the distal esophagus, which may reflect chronic inflammation or possibly esophagitis. Electronically Signed   By: Garald Balding M.D.   On: 09/20/2015 22:32    Time Spent in minutes  25   Louellen Molder M.D on 10/09/2015 at 2:26 PM  Between 7am to 7pm - Pager - [phone]  After 7pm go to [email or web address] - password [email or web address]  [facility] -  Office  [phone]

## 2015-10-10 DIAGNOSIS — R1115 Cyclical vomiting syndrome unrelated to migraine: Secondary | ICD-10-CM

## 2015-10-10 DIAGNOSIS — G43A0 Cyclical vomiting, not intractable: Secondary | ICD-10-CM

## 2015-10-10 DIAGNOSIS — L03116 Cellulitis of left lower limb: Secondary | ICD-10-CM

## 2015-10-10 LAB — GLUCOSE, CAPILLARY: GLUCOSE-CAPILLARY: 112 mg/dL — AB (ref 65–99)

## 2015-10-10 LAB — PROCALCITONIN

## 2015-10-10 MED ORDER — CLINDAMYCIN HCL 300 MG PO CAPS: 600.0000 mg | ORAL_CAPSULE | Freq: Three times a day (TID) | ORAL | 0 refills | Status: AC

## 2015-10-10 MED ORDER — CLINDAMYCIN HCL 300 MG PO CAPS: 600.0000 mg | ORAL_CAPSULE | Freq: Three times a day (TID) | ORAL | 0 refills | Status: DC

## 2015-10-10 NOTE — Discharge Instructions (Signed)
Cellulitis °Cellulitis is an infection of the skin and the tissue under the skin. The infected area is usually red and tender. This happens most often in the arms and lower legs. °HOME CARE  °· Take your antibiotic medicine as told. Finish the medicine even if you start to feel better. °· Keep the infected arm or leg raised (elevated). °· Put a warm cloth on the area up to 4 times per day. °· Only take medicines as told by your doctor. °· Keep all doctor visits as told. °GET HELP IF: °· You see red streaks on the skin coming from the infected area. °· Your red area gets bigger or turns a dark color. °· Your bone or joint under the infected area is painful after the skin heals. °· Your infection comes back in the same area or different area. °· You have a puffy (swollen) bump in the infected area. °· You have new symptoms. °· You have a fever. °GET HELP RIGHT AWAY IF:  °· You feel very sleepy. °· You throw up (vomit) or have watery poop (diarrhea). °· You feel sick and have muscle aches and pains. °  °This information is not intended to replace advice given to you by your health care provider. Make sure you discuss any questions you have with your health care provider. °  °Document Released: 08/19/2007 Document Revised: 11/21/2014 Document Reviewed: 05/18/2011 °Elsevier Interactive Patient Education ©2016 [employer] ° °

## 2015-10-10 NOTE — Progress Notes (Signed)
Pt discharged to home via [place]. They will place him in wheelchair when he arrives home. Discharge instructions given and all questions answered.

## 2015-10-10 NOTE — Discharge Summary (Signed)
Physician Discharge Summary  Thomas Ramos [id number] DOB: Mar 11, 1971 DOA: 10/06/2015  PCP: Chesley Noon, MD  Admit date: 10/06/2015 Discharge date: 10/10/2015  Admitted From: home Disposition:  home  Recommendations for Outpatient Follow-up:  1. Follow up with PCP in 1-2 weeks. Completes antibiotic course on 10/17/2015.   Home Health:no.  Equipment/Devices: NONE Discharge Condition: stable CODE STATUS: Full Code Diet recommendation: Regular    Discharge Diagnoses:  Principal Problem:   Cellulitis of left leg   Active Problems:   AKI (acute kidney injury) ([place])   Dysphagia   HTN (hypertension)   GERD (gastroesophageal reflux disease)   Cerebral palsy (HCC)   Depression with anxiety   Chronic diastolic (congestive) heart failure (HCC)   Hyponatremia   Leukocytosis   Nausea & vomiting  Brief narrative/history of present illness 45 year old male with cerebral palsy with spastic quadriplegia, hypertension, chronic diastolic CHF, GERD recently hospitalized from 7/89 and 7/22 with cellulitis of the left leg and discharged on oral Bactrim return to the ED after a delayed nausea with vomiting and unable to take his medications. Placed on IV antibiotics and hydration.  Hospital course Cellulitis of left lower extremity [facility]) Patient was admitted to the hospital from 7/18-7/22 for cellulitis and discharged on Bactrim but due to nausea and vomiting was not able to take his antibiotics and return to the ED. placed on IV clindamycin. Cellulitis significantly improved and exam today. I will discharge him on oral clindamycin for 7 more days to complete 10 day course of antibiotics. He should follow-up with his PCP in 1 week.   Active Problems:   Dysphagia Seen by speech eval and recommends regular diet.    HTN (hypertension) Stable. Continue home medications  Cerebral palsy Stable.     Depression with anxiety Continue Celexa, BuSpar    Chronic diastolic  (congestive) heart failure (HCC)   Euvolemic. Continue statin.  Acute kidney injury Mild. Bactrim discontinued and hydrated. Resolved.     Family Communication  : None at bedside  Disposition Plan  : Home     Consults  :  None  Procedures  : None   Discharge Instructions     Medication List    STOP taking these medications   sulfamethoxazole-trimethoprim 800-160 MG tablet Commonly known as:  BACTRIM DS,SEPTRA DS     TAKE these medications   albuterol 108 (90 Base) MCG/ACT inhaler Commonly known as:  PROVENTIL HFA;VENTOLIN HFA Inhale 1-2 puffs into the lungs every 6 (six) hours as needed for wheezing or shortness of breath.   amLODipine 10 MG tablet Commonly known as:  NORVASC Take 10 mg by mouth daily.   baclofen 10 MG tablet Commonly known as:  LIORESAL Take 10 mg by mouth 3 (three) times daily as needed. For neck stiffness/pain.   busPIRone 7.5 MG tablet Commonly known as:  BUSPAR Take 7.5 mg by mouth 2 (two) times daily as needed (for anxiousness).   cetirizine 10 MG tablet Commonly known as:  ZYRTEC Take 10 mg by mouth daily.   citalopram 20 MG tablet Commonly known as:  CELEXA Take 20 mg by mouth every evening.   clindamycin 300 MG capsule Commonly known as:  CLEOCIN Take 2 capsules (600 mg total) by mouth 3 (three) times daily.   CRESTOR 5 MG tablet Generic drug:  rosuvastatin Take 5 mg by mouth at bedtime.   fluticasone 50 MCG/ACT nasal spray Commonly known as:  FLONASE Place 1 spray into both nostrils daily.   HYDROcodone-acetaminophen 5-325 MG  tablet Commonly known as:  NORCO Take 1 tablet by mouth every 4 (four) hours as needed for moderate pain.   montelukast 10 MG tablet Commonly known as:  SINGULAIR Take 10 mg by mouth daily.   nitroGLYCERIN 0.4 MG SL tablet Commonly known as:  NITROSTAT Place 0.4 mg under the tongue every 5 (five) minutes as needed. For chest pain.   omeprazole 20 MG capsule Commonly known as:   PRILOSEC Take 1 capsule (20 mg total) by mouth daily.   ranitidine 150 MG tablet Commonly known as:  ZANTAC Take 150 mg by mouth 2 (two) times daily as needed. For heartburn.   valsartan 80 MG tablet Commonly known as:  DIOVAN Take 80 mg by mouth daily.      Follow-up Information    BADGER,MICHAEL C, MD. Schedule an appointment as soon as possible for a visit in 1 week(s).   Specialty:  Family Medicine Contact information: [place] [address] [phone]          Allergies  Allergen Reactions  . Metronidazole Nausea And Vomiting  . Penicillins Hives and Nausea And Vomiting    Has patient had a PCN reaction causing immediate rash, facial/tongue/throat swelling, SOB or lightheadedness with hypotension: yes Has patient had a PCN reaction causing severe rash involving mucus membranes or skin necrosis: no Has patient had a PCN reaction that required hospitalization: no Has patient had a PCN reaction occurring within the last 10 years: no If all of the above answers are "NO", then may proceed with Cephalosporin use.     Consultations:  None   Procedures/Studies: Dg Chest 2 View  Result Date: 10/01/2015 CLINICAL DATA:  Chest pain.  Recent pneumonia EXAM: CHEST  2 VIEW COMPARISON:  Chest radiograph Aug 07, 2015 and chest CT September 20, 2015 FINDINGS: There is stable elevation of the right hemidiaphragm. There is no appreciable edema or consolidation. Heart is mildly enlarged with pulmonary vascularity within normal limits. No adenopathy. There is degenerative change in the thoracic spine. IMPRESSION: Stable elevation the right hemidiaphragm. Stable cardiac silhouette. No edema or consolidation. Electronically Signed   By: Lowella Grip III M.D.   On: 10/01/2015 15:47   Ct Angio Chest Pe W/cm &/or Wo Cm  Result Date: 09/20/2015 CLINICAL DATA:  Acute onset of left lower extremity swelling and dyspnea. Tachypnea and tachycardia. Initial encounter. EXAM: CT  ANGIOGRAPHY CHEST WITH CONTRAST TECHNIQUE: Multidetector CT imaging of the chest was performed using the standard protocol during bolus administration of intravenous contrast. Multiplanar CT image reconstructions and MIPs were obtained to evaluate the vascular anatomy. CONTRAST:  80 mL of Isovue 370 IV contrast COMPARISON:  CTA of the chest performed 08/11/2015, and chest radiograph performed 08/07/2015 FINDINGS: There is no evidence of pulmonary embolus. Mild patchy focal airspace opacification is noted at the right upper lobe, compatible with pneumonia. Evaluation is somewhat suboptimal due to motion artifact. There is no evidence of pleural effusion or pneumothorax. No masses are identified; no abnormal focal contrast enhancement is seen. A moderate to large hiatal hernia is noted. There is wall thickening along the distal esophagus, which may reflect chronic inflammation or possibly esophagitis. The mediastinum is otherwise unremarkable. No mediastinal lymphadenopathy is seen. No pericardial effusion is identified. The great vessels are grossly unremarkable in appearance. No axillary lymphadenopathy is seen. The visualized portions of the thyroid gland are unremarkable in appearance. The visualized portions of the liver and spleen are unremarkable. No acute osseous abnormalities are seen. Review of  the MIP images confirms the above findings. IMPRESSION: 1. No evidence of pulmonary embolus. 2. Right upper lobe pneumonia noted. 3. Moderate to large hiatal hernia seen. 4. Wall thickening along the distal esophagus, which may reflect chronic inflammation or possibly esophagitis. Electronically Signed   By: Garald Balding M.D.   On: 09/20/2015 22:32       Subjective: Denies any pain in his leg, nausea or vomiting.  Discharge Exam: Vitals:   10/10/15 0458 10/10/15 0759  BP: (!) 128/52 136/73  Pulse: 62 78  Resp: 16 17  Temp: 97.6 F (36.4 C) 98.1 F (36.7 C)   Vitals:   10/09/15 0831 10/09/15 1700  10/10/15 0458 10/10/15 0759  BP: 131/81 111/82 (!) 128/52 136/73  Pulse: 75 81 62 78  Resp: 18 18 16 17   Temp: 98.2 F (36.8 C) 97.9 F (36.6 C) 97.6 F (36.4 C) 98.1 F (36.7 C)  TempSrc: Oral Oral Oral Oral  SpO2: 98% 100% 98% 100%  Weight:      Height:        Gen: not in distress HEENT:  moist mucosa, supple neck Chest: clear b/l, no added sounds CVS: N S1&S2, no murmurs, GI: soft, NT, ND, BS+ Musculoskeletal: Erythema over left leg much improved, no warmth or tenderness CNS: Alert and oriented,    The results of significant diagnostics from this hospitalization (including imaging, microbiology, ancillary and laboratory) are listed below for reference.     Microbiology: Recent Results (from the past 240 hour(s))  Culture, blood (routine x 2)     Status: None   Collection Time: 10/01/15  8:54 PM  Result Value Ref Range Status   Specimen Description BLOOD LEFT HAND  Final   Special Requests BOTTLES DRAWN AEROBIC ONLY 5CC  Final   Culture   Final    NO GROWTH 5 DAYS Performed at [facility]    Report Status 10/06/2015 FINAL  Final  Culture, blood (routine x 2)     Status: None   Collection Time: 10/01/15  8:54 PM  Result Value Ref Range Status   Specimen Description LEFT ANTECUBITAL  Final   Special Requests BOTTLES DRAWN AEROBIC AND ANAEROBIC 5CC  Final   Culture   Final    NO GROWTH 5 DAYS Performed at [facility]    Report Status 10/06/2015 FINAL  Final     Labs: BNP (last 3 results)  Recent Labs  06/30/15 2322 07/08/15 0023 10/01/15 2054  BNP 12.3 9.2 [id number]   Basic Metabolic Panel:  Recent Labs Lab 10/06/15 2216 10/07/15 0400 10/09/15 0406  NA 134* 134* 136  K 4.4 4.0 3.8  CL 102 103 106  CO2 20* 25 25  GLUCOSE 172* 118* 109*  BUN 27* 22* 14  CREATININE 1.15 0.84 0.73  CALCIUM 9.4 8.9 8.7*   Liver Function Tests: No results for input(s): AST, ALT, ALKPHOS, BILITOT, PROT, ALBUMIN in the last 168 hours. No results for  input(s): LIPASE, AMYLASE in the last 168 hours. No results for input(s): AMMONIA in the last 168 hours. CBC:  Recent Labs Lab 10/06/15 2216 10/09/15 0406  WBC 16.4* 4.1  NEUTROABS 14.5*  --   HGB 13.3 10.8*  HCT 40.8 34.4*  MCV 83.3 85.6  PLT 306 205   Cardiac Enzymes: No results for input(s): CKTOTAL, CKMB, CKMBINDEX, TROPONINI in the last 168 hours. BNP: Invalid input(s): POCBNP CBG:  Recent Labs Lab 10/06/15 2020 10/07/15 0729 10/08/15 0730 10/09/15 0739 10/10/15 0754  GLUCAP 267* 101*  111* 111* 112*   D-Dimer No results for input(s): DDIMER in the last 72 hours. Hgb A1c No results for input(s): HGBA1C in the last 72 hours. Lipid Profile No results for input(s): CHOL, HDL, LDLCALC, TRIG, CHOLHDL, LDLDIRECT in the last 72 hours. Thyroid function studies No results for input(s): TSH, T4TOTAL, T3FREE, THYROIDAB in the last 72 hours.  Invalid input(s): FREET3 Anemia work up No results for input(s): VITAMINB12, FOLATE, FERRITIN, TIBC, IRON, RETICCTPCT in the last 72 hours. Urinalysis    Component Value Date/Time   COLORURINE YELLOW 10/01/2015 1557   APPEARANCEUR CLEAR 10/01/2015 1557   LABSPEC 1.025 10/01/2015 1557   PHURINE 6.0 10/01/2015 1557   GLUCOSEU NEGATIVE 10/01/2015 1557   HGBUR NEGATIVE 10/01/2015 1557   BILIRUBINUR NEGATIVE 10/01/2015 1557   KETONESUR NEGATIVE 10/01/2015 1557   PROTEINUR NEGATIVE 10/01/2015 1557   UROBILINOGEN 1.0 05/26/2014 2211   NITRITE NEGATIVE 10/01/2015 1557   LEUKOCYTESUR NEGATIVE 10/01/2015 1557   Sepsis Labs Invalid input(s): PROCALCITONIN,  WBC,  LACTICIDVEN Microbiology Recent Results (from the past 240 hour(s))  Culture, blood (routine x 2)     Status: None   Collection Time: 10/01/15  8:54 PM  Result Value Ref Range Status   Specimen Description BLOOD LEFT HAND  Final   Special Requests BOTTLES DRAWN AEROBIC ONLY 5CC  Final   Culture   Final    NO GROWTH 5 DAYS Performed at [facility]    Report  Status 10/06/2015 FINAL  Final  Culture, blood (routine x 2)     Status: None   Collection Time: 10/01/15  8:54 PM  Result Value Ref Range Status   Specimen Description LEFT ANTECUBITAL  Final   Special Requests BOTTLES DRAWN AEROBIC AND ANAEROBIC 5CC  Final   Culture   Final    NO GROWTH 5 DAYS Performed at [facility]    Report Status 10/06/2015 FINAL  Final     Time coordinating discharge: <30 minutes  SIGNED:   Louellen Molder, MD  [facility] 10/10/2015, 10:15 AM Pager   If 7PM-7AM, please contact night-coverage [email or web address] Password [email or web address]

## 2015-10-18 ENCOUNTER — Ambulatory Visit: Payer: Medicare Other | Admitting: Pulmonary Disease

## 2015-10-22 ENCOUNTER — Ambulatory Visit (INDEPENDENT_AMBULATORY_CARE_PROVIDER_SITE_OTHER): Payer: Medicare Other | Admitting: Gastroenterology

## 2015-10-22 ENCOUNTER — Encounter: Payer: Self-pay | Admitting: Gastroenterology

## 2015-10-22 VITALS — BP 112/70 | HR 86

## 2015-10-22 DIAGNOSIS — R112 Nausea with vomiting, unspecified: Secondary | ICD-10-CM

## 2015-10-22 DIAGNOSIS — R1310 Dysphagia, unspecified: Secondary | ICD-10-CM | POA: Diagnosis not present

## 2015-10-22 DIAGNOSIS — G800 Spastic quadriplegic cerebral palsy: Secondary | ICD-10-CM

## 2015-10-22 NOTE — Progress Notes (Signed)
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  Was seen by speech pathology during recent hospitlization.  ROS: Cardiovascular:  no chest pain Respiratory: no dyspnea  The patient's Past Medical, Family and Social History were reviewed and are on file in the EMR.  Objective:  Med list reviewed  Vital signs in last 24 hrs: Vitals:   10/22/15 1052  BP: 112/70  Pulse: 86    Physical Exam Spasticity, wheelchair bound.  Here with a caregiver  HEENT: sclera anicteric, oral mucosa moist without lesion  Cardiac: RRR without murmurs, S1S2 heard, no peripheral edema  Pulm: clear to auscultation bilaterally, normal RR and low volumes noted  Abdomen: soft, no tenderness, with active bowel sounds. Limited exam in wheelchair  Skin; warm and dry, no jaundice or rash.  Edema left lower leg and foot with mild erythema (recent cellulitis).  His caregiver reports that it is much improved from when in hospital  Radiologic studies:  2012 barium swallow report reviewed.  Last EGD report reviewed.  @ASSESSMENTPLANBEGIN @ Assessment: Encounter Diagnoses  Name Primary?  . Non-intractable vomiting with nausea, vomiting of unspecified type Yes  . Dysphagia   . Spastic quadriplegic cerebral palsy (HCC)    Acute symptoms from Abx, now resolved. Dysphagia has had excellent response to Botox x 3 (however, may have less effect if used in future, and would make balloon dilation or surgery more difficult)  Plan: Follow up in one year or sooner if needed.  Total time 20 minutes, over half spent in counseling and coordination of care.   [name]

## 2015-10-22 NOTE — Patient Instructions (Addendum)
If you are age 43 or older, your body mass index should be between 23-30. Your There is no height or weight on file to calculate BMI. If this is out of the aforementioned range listed, please consider follow up with your Primary Care Provider.  If you are age 10 or younger, your body mass index should be between 19-25. Your There is no height or weight on file to calculate BMI. If this is out of the aformentioned range listed, please consider follow up with your Primary Care Provider.   Follow up in one year.  Thank you for choosing [facility]  Dr Wilfrid Lund III

## 2015-11-04 ENCOUNTER — Ambulatory Visit: Payer: Medicare Other | Admitting: Neurology

## 2015-11-08 ENCOUNTER — Ambulatory Visit (HOSPITAL_BASED_OUTPATIENT_CLINIC_OR_DEPARTMENT_OTHER): Payer: Medicare Other | Attending: Pulmonary Disease | Admitting: Pulmonary Disease

## 2015-11-08 VITALS — Ht 60.0 in | Wt 180.0 lb

## 2015-11-08 DIAGNOSIS — G4736 Sleep related hypoventilation in conditions classified elsewhere: Secondary | ICD-10-CM | POA: Diagnosis not present

## 2015-11-08 DIAGNOSIS — G4730 Sleep apnea, unspecified: Secondary | ICD-10-CM

## 2015-11-08 DIAGNOSIS — R5383 Other fatigue: Secondary | ICD-10-CM | POA: Insufficient documentation

## 2015-11-08 DIAGNOSIS — G4710 Hypersomnia, unspecified: Secondary | ICD-10-CM | POA: Insufficient documentation

## 2015-11-08 DIAGNOSIS — G4733 Obstructive sleep apnea (adult) (pediatric): Secondary | ICD-10-CM | POA: Diagnosis not present

## 2015-11-08 DIAGNOSIS — R0683 Snoring: Secondary | ICD-10-CM | POA: Insufficient documentation

## 2015-11-10 ENCOUNTER — Telehealth: Payer: Self-pay | Admitting: Pulmonary Disease

## 2015-11-10 DIAGNOSIS — G4710 Hypersomnia, unspecified: Secondary | ICD-10-CM

## 2015-11-10 DIAGNOSIS — G4730 Sleep apnea, unspecified: Secondary | ICD-10-CM | POA: Diagnosis not present

## 2015-11-10 NOTE — Procedures (Signed)
NAME: JUNIEL BERNIE DATE OF BIRTH:  [birth year] MEDICAL RECORD NUMBER [id number]  LOCATION: [facility]  PHYSICIAN: [place] OF STUDY: 11/08/2015   Patient Name: Thomas Ramos, Thomas Ramos Date: 11/08/2015   Gender: Male  D.O.B: 1970-04-01  Age (years): 45  Referring Provider: [place]   Height (inches): 60  Interpreting Physician: Luis Llorens Torres   Weight (lbs): 180  RPSGT: Baxter Flattery   BMI: 35  MRN: [id number]  Neck Size: 17.00    CLINICAL INFORMATION  Sleep Study Type: NPSG  Indication for sleep study: Daytime Fatigue, Fatigue, Snoring, Witnesses Apnea / Gasping During Sleep  Epworth Sleepiness Score:   SLEEP STUDY TECHNIQUE  As per the AASM Manual for the Scoring of Sleep and Associated Events v2.3 (April 2016) with a hypopnea requiring 4% desaturations.  The channels recorded and monitored were frontal, central and occipital EEG, electrooculogram (EOG), submentalis EMG (chin), nasal and oral airflow, thoracic and abdominal wall motion, anterior tibialis EMG, snore microphone, electrocardiogram, and pulse oximetry.   MEDICATIONS  Patient's medications include: N/A. Meds reviewed per chart review. Medications self-administered by patient during sleep study : No sleep medicine administered.  SLEEP ARCHITECTURE  The study was initiated at 10:53:00 PM and ended at 5:05:21 AM.  Sleep onset time was 14.0 minutes and the sleep efficiency was 76.1%. The total sleep time was 283.3 minutes.  Stage REM latency was 257.5 minutes.  The patient spent 10.06% of the night in stage N1 sleep, 65.94% in stage N2 sleep, 7.94% in stage N3 and 16.06% in REM.  Alpha intrusion was absent.  Supine sleep was 74.11%.   RESPIRATORY PARAMETERS  The overall apnea/hypopnea index (AHI) was 28.4 per hour. There were 54 total apneas, including 54 obstructive, 0 central and 0 mixed apneas. There were 80 hypopneas and 8 RERAs.  The AHI during Stage  REM sleep was 56.7 per hour. AHI while supine was 24.3 per hour.  The mean oxygen saturation was 92.41%. The minimum SpO2 during sleep was 61.00%.  Loud snoring was noted during this study.  CARDIAC DATA  The 2 lead EKG demonstrated sinus rhythm. The mean heart rate was 66.40 beats per minute. Other EKG findings include: None.   LEG MOVEMENT DATA  The total PLMS were 0 with a resulting PLMS index of 0.00. Associated arousal with leg movement index was 0.0 .  IMPRESSIONS  Moderate obstructive sleep apnea occurred during this study (AHI = 28.4/h). No significant central sleep apnea occurred during this study (CAI = 0.0/h). Severe oxygen desaturation was noted during this study (Min O2 = 61.00%). The patient snored with Loud snoring volume. No cardiac abnormalities were noted during this study. Clinically significant periodic limb movements did not occur during sleep. No significant associated arousals.  DIAGNOSIS  Obstructive Sleep Apnea (327.23 [G47.33 ICD-10]) Nocturnal Hypoxemia (327.26 [G47.36 ICD-10])  RECOMMENDATIONS  1. Therapeutic CPAP titration to determine optimal pressure required to alleviate sleep disordered breathing. Alternatively, may try patient on autocpap 5-15 cm water. He will need a mask fitting session to determine best mask fit. He will need a 1 month download to determine CPAP wfficacy. 2. Avoid alcohol, sedatives and other CNS depressants that may worsen sleep apnea and disrupt normal sleep architecture. 3. Sleep hygiene should be reviewed to assess factors that may improve sleep quality. 4. Weight management and regular exercise should be initiated or continued if appropriate. 5. Follow up in the office 4-6  weeks after obtaining cpap machine.  Monica Becton, MD 11/10/2015, 2:59 PM [facility] Pager [phone] After 3 pm or if no answer, call [phone]

## 2015-11-10 NOTE — Telephone Encounter (Signed)
    Please call the pt and tell the pt the [place]  showed OSA.   Pt stops breathing 28   times an hour.    Please order autoCPAP 5-15 cm H2O. Patient will need a mask fitting session. Patient will need a 1 month download.   Patient needs to be seen by me or any of the NPs/APPs  4-6 weeks after obtaining the cpap machine. Let me know if you receive this.   Thanks!   J. Shirl Harris, MD 11/10/2015, 3:01 PM

## 2015-11-13 ENCOUNTER — Emergency Department (HOSPITAL_BASED_OUTPATIENT_CLINIC_OR_DEPARTMENT_OTHER)
Admit: 2015-11-13 | Discharge: 2015-11-13 | Disposition: A | Discharge status: Home / Self Care | Payer: Medicare Other | Attending: Emergency Medicine | Admitting: Emergency Medicine

## 2015-11-13 ENCOUNTER — Emergency Department (HOSPITAL_COMMUNITY)
Admission: EM | Admit: 2015-11-13 | Discharge: 2015-11-13 | Disposition: A | Discharge status: Home / Self Care | Payer: Medicare Other | Attending: Emergency Medicine | Admitting: Emergency Medicine

## 2015-11-13 ENCOUNTER — Encounter (HOSPITAL_COMMUNITY): Payer: Self-pay | Admitting: Nurse Practitioner

## 2015-11-13 DIAGNOSIS — L03116 Cellulitis of left lower limb: Secondary | ICD-10-CM | POA: Insufficient documentation

## 2015-11-13 DIAGNOSIS — I5032 Chronic diastolic (congestive) heart failure: Secondary | ICD-10-CM | POA: Insufficient documentation

## 2015-11-13 DIAGNOSIS — E119 Type 2 diabetes mellitus without complications: Secondary | ICD-10-CM | POA: Insufficient documentation

## 2015-11-13 DIAGNOSIS — M7989 Other specified soft tissue disorders: Secondary | ICD-10-CM | POA: Diagnosis not present

## 2015-11-13 DIAGNOSIS — Z79899 Other long term (current) drug therapy: Secondary | ICD-10-CM | POA: Diagnosis not present

## 2015-11-13 DIAGNOSIS — M79662 Pain in left lower leg: Secondary | ICD-10-CM | POA: Diagnosis present

## 2015-11-13 DIAGNOSIS — Z87891 Personal history of nicotine dependence: Secondary | ICD-10-CM | POA: Diagnosis not present

## 2015-11-13 DIAGNOSIS — I110 Hypertensive heart disease with heart failure: Secondary | ICD-10-CM | POA: Insufficient documentation

## 2015-11-13 LAB — CBC
HEMATOCRIT: 38.7 % — AB (ref 39.0–52.0)
HEMOGLOBIN: 12.9 g/dL — AB (ref 13.0–17.0)
MCH: 27.3 pg (ref 26.0–34.0)
MCHC: 33.3 g/dL (ref 30.0–36.0)
MCV: 81.8 fL (ref 78.0–100.0)
Platelets: 235 10*3/uL (ref 150–400)
RBC: 4.73 MIL/uL (ref 4.22–5.81)
RDW: 12.6 % (ref 11.5–15.5)
WBC: 6.2 10*3/uL (ref 4.0–10.5)

## 2015-11-13 LAB — BASIC METABOLIC PANEL
ANION GAP: 6 (ref 5–15)
BUN: 17 mg/dL (ref 6–20)
CHLORIDE: 103 mmol/L (ref 101–111)
CO2: 29 mmol/L (ref 22–32)
Calcium: 9.2 mg/dL (ref 8.9–10.3)
Creatinine, Ser: 0.56 mg/dL — ABNORMAL LOW (ref 0.61–1.24)
GFR calc non Af Amer: >60 mL/min (ref >60)
Glucose, Bld: 159 mg/dL — ABNORMAL HIGH (ref 65–99)
POTASSIUM: 3.6 mmol/L (ref 3.5–5.1)
SODIUM: 138 mmol/L (ref 135–145)

## 2015-11-13 MED ORDER — CLINDAMYCIN HCL 300 MG PO CAPS: 300.0000 mg | ORAL_CAPSULE | Freq: Three times a day (TID) | ORAL | 0 refills | Status: DC

## 2015-11-13 MED ORDER — HYDROCODONE-ACETAMINOPHEN 5-325 MG PO TABS: 1.0000 | ORAL_TABLET | Freq: Four times a day (QID) | ORAL | 0 refills | Status: DC | PRN

## 2015-11-13 MED ORDER — OXYCODONE-ACETAMINOPHEN 5-325 MG PO TABS
1.0000 | ORAL_TABLET | Freq: Once | ORAL | Status: AC
  Administered 2015-11-13: 1 via ORAL
  Filled 2015-11-13: qty 1

## 2015-11-13 MED ORDER — CLINDAMYCIN HCL 300 MG PO CAPS
300.0000 mg | ORAL_CAPSULE | Freq: Once | ORAL | Status: AC
  Administered 2015-11-13: 300 mg via ORAL
  Filled 2015-11-13: qty 1

## 2015-11-13 NOTE — Progress Notes (Addendum)
VASCULAR LAB PRELIMINARY  PRELIMINARY  PRELIMINARY  PRELIMINARY  Left lower extremity venous duplex completed.    Preliminary report: Technically difficult due to suboptimal imaging position due to near contractions of the leg.  Left:  No obvious evidence of DVT, superficial thrombosis, or Baker's cyst.  Amarius Toto, RVS 11/13/2015, 5:04 PM

## 2015-11-13 NOTE — ED Triage Notes (Signed)
Had cellulitis about 2 months ago hasnt fully going away, still hurts, wheelchair bound does move around much.

## 2015-11-13 NOTE — ED Provider Notes (Signed)
[facility] DEPT Provider Note   CSN: [id number] Arrival date & time: 11/13/15  1410     History   Chief Complaint Chief Complaint  Patient presents with  . Leg Pain    HPI Thomas Ramos is a 45 y.o. male.  HPI Patient presents to the emergency department complaints of pain and swelling of his left lower extremity.  He has a history of proximal May 6 weeks ago of cellulitis in his left lower extremity.  This improved with antibiotics.  He's been doing well until the last 24 hours when he began noticing redness and warmth again of this left lower extremity.  He has cerebral palsy and lives at a facility.  He does report any fever.  He has no other complaints at this time.  He has chronic contractures of his bilateral lower extremities and bilateral ankles.   Past Medical History:  Diagnosis Date  . Anxiety   . Cerebral palsy ([place])   . Chronic diastolic (congestive) heart failure ([place])   . CP (cerebral palsy) ([place])   . Diabetes mellitus without complication (HCC)    borderline  . Environmental allergies    takes inhalers if needed  . Esophageal stricture   . GERD (gastroesophageal reflux disease)   . Hypertension   . Motility disorder, esophageal   . Pneumonia   . 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[place])   . S/P Botox injection    approx every 4 months  . Seasonal allergies     Patient Active Problem List   Diagnosis Date Noted  . Hyponatremia 10/06/2015  . AKI (acute kidney injury) ([place]) 10/06/2015  . Leukocytosis 10/06/2015  . Nausea & vomiting 10/06/2015  . Chronic diastolic (congestive) heart failure ([place])   . Leg swelling   . Community acquired pneumonia 09/20/2015  . Depression with anxiety 09/20/2015  . Cellulitis of left leg 09/20/2015  . CAP (community acquired pneumonia) 09/20/2015  . Hypersomnia 07/16/2015  . Dyspnea 07/16/2015  . Asthma 07/16/2015  . GERD (gastroesophageal reflux disease) 07/16/2015  . Cerebral palsy ([place]) 07/16/2015  . Hematemesis  with nausea   . Influenza with pneumonia   . Aspiration pneumonia ([place]) 05/24/2014  . HTN (hypertension) 05/24/2014  . Fever 05/24/2014  . Dysphagia 09/16/2010  . 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 BOTOX INJECTION N/A 12/21/2012   Procedure: BOTOX INJECTION;  Surgeon: Inda Castle, MD;  Location: [facility];  Service: Endoscopy;  Laterality: N/A;  . BOTOX INJECTION N/A 06/28/2014   Procedure: BOTOX INJECTION;  Surgeon: Inda Castle, MD;  Location: [facility];  Service: Endoscopy;  Laterality: N/A;  . ESOPHAGOGASTRODUODENOSCOPY N/A 12/21/2012   Procedure: ESOPHAGOGASTRODUODENOSCOPY (EGD);  Surgeon: Inda Castle, MD;  Location: [facility];  Service: Endoscopy;  Laterality: N/A;  . ESOPHAGOGASTRODUODENOSCOPY N/A 06/28/2014   Procedure: ESOPHAGOGASTRODUODENOSCOPY (EGD);  Surgeon: Inda Castle, MD;  Location: [facility];  Service: Endoscopy;  Laterality: N/A;  . ESOPHAGUS SURGERY     stretched esophagus  . legs    . MOUTH SURGERY    . TENDON RELEASE         Home Medications    Prior to Admission medications   Medication Sig Start Date End Date Taking? Authorizing Provider  albuterol (PROVENTIL HFA;VENTOLIN HFA) 108 (90 Base) MCG/ACT inhaler Inhale 1-2 puffs into the lungs every 6 (six) hours as needed for wheezing or shortness of breath. 08/26/15  Yes Ripley Fraise, MD  amLODipine (NORVASC) 10 MG  tablet Take 10 mg by mouth daily.   Yes Historical Provider, MD  baclofen (LIORESAL) 10 MG tablet Take 10 mg by mouth 3 (three) times daily as needed. For neck stiffness/pain. 08/30/15  Yes Historical Provider, MD  busPIRone (BUSPAR) 7.5 MG tablet Take 7.5 mg by mouth 2 (two) times daily as needed (for anxiousness).    Yes Historical Provider, MD  cetirizine (ZYRTEC) 10 MG tablet Take 10 mg by mouth daily.  03/01/14  Yes Historical Provider, MD  citalopram (CELEXA) 20 MG tablet Take 20 mg by mouth every evening.    Yes Historical Provider, MD  clindamycin (CLEOCIN) 300 MG capsule Take 300 mg by mouth 3 (three) times daily. 11/12/15 11/22/15 Yes Historical Provider, MD  CRESTOR 5 MG tablet Take 5 mg by mouth at bedtime. 04/22/14  Yes Historical Provider, MD  fluticasone (FLONASE) 50 MCG/ACT nasal spray Place 1 spray into both nostrils daily as needed for allergies.  03/01/14  Yes Historical Provider, MD  furosemide (LASIX) 20 MG tablet Take 20 mg by mouth daily. 10/17/15  Yes Historical Provider, MD  montelukast (SINGULAIR) 10 MG tablet Take 10 mg by mouth daily.   Yes Historical Provider, MD  nitroGLYCERIN (NITROSTAT) 0.4 MG SL tablet Place 0.4 mg under the tongue every 5 (five) minutes as needed. For chest pain. 08/26/15  Yes Historical Provider, MD  omeprazole (PRILOSEC) 20 MG capsule Take 1 capsule (20 mg total) by mouth daily. 08/11/15  Yes April Palumbo, MD  potassium chloride (K-DUR) 10 MEQ tablet Take 10 mEq by mouth daily. 11/11/15  Yes Historical Provider, MD  ranitidine (ZANTAC) 150 MG tablet Take 150 mg by mouth 2 (two) times daily as needed. For heartburn. 08/26/15  Yes Historical Provider, MD  valsartan (DIOVAN) 80 MG tablet Take 80 mg by mouth daily. 08/26/15  Yes Historical Provider, MD  HYDROcodone-acetaminophen (NORCO) 5-325 MG tablet Take 1 tablet by mouth every 4 (four) hours as needed for moderate pain. Patient not taking: Reported on 11/13/2015 0000000   Delora Fuel, MD    Family History Family History  Problem Relation Age of Onset  . Hypertension Father   . Lung cancer Father   . Diabetes Mother     Social History Social History  Substance Use Topics  . Smoking status: Former Research scientist (life sciences)  . Smokeless tobacco: Former Systems developer     Comment: smoked one time  . Alcohol use 0.0 oz/week     Comment: 2 beers every other Friday and Saturday     Allergies   Sulfamethoxazole-trimethoprim; Metronidazole; and Penicillins   Review of Systems Review of Systems  All other systems reviewed and are  negative.    Physical Exam Updated Vital Signs BP 160/100 (BP Location: Right Arm)   Pulse 100   Temp 97.5 F (36.4 C) (Oral)   Resp 18   SpO2 99%   Physical Exam  Constitutional: He is oriented to person, place, and time. He appears well-developed and well-nourished.  HENT:  Head: Normocephalic.  Eyes: EOM are normal.  Neck: Normal range of motion.  Pulmonary/Chest: Effort normal.  Abdominal: He exhibits no distension.  Musculoskeletal:  Left anterior tibia swelling with associated erythema warmth.  Normal pulses in left foot.  Chronic contractures of bilateral lower extremities.  No swelling or redness or tenderness of his left thigh  Neurological: He is alert and oriented to person, place, and time.  Psychiatric: He has a normal mood and affect.  Nursing note and vitals reviewed.    ED Treatments /  Results  Labs (all labs ordered are listed, but only abnormal results are displayed) Labs Reviewed  CBC - Abnormal; Notable for the following:       Result Value   Hemoglobin 12.9 (*)    HCT 38.7 (*)    All other components within normal limits  BASIC METABOLIC PANEL - Abnormal; Notable for the following:    Glucose, Bld 159 (*)    Creatinine, Ser 0.56 (*)    All other components within normal limits    EKG  EKG Interpretation None       Radiology No results found.  Procedures Procedures (including critical care time)  Medications Ordered in ED Medications  oxyCODONE-acetaminophen (PERCOCET/ROXICET) 5-325 MG per tablet 1 tablet (1 tablet Oral Given 11/13/15 1512)  clindamycin (CLEOCIN) capsule 300 mg (300 mg Oral Given 11/13/15 1512)     Initial Impression / Assessment and Plan / ED Course  I have reviewed the triage vital signs and the nursing notes.  Pertinent labs & imaging results that were available during my care of the patient were reviewed by me and considered in my medical decision making (see chart for details).  Clinical Course    Patient  is overall well-appearing.  Nontoxic.  He does appear to have stabilized of his left lower extremity but it is swollen and therefore I will obtain a duplex of his left lower extremity given his relative immobility.  I suspect this to be normal and the plan will be to send the patient home on clindamycin.  I have asked  that he either follow up with his physician or return to the ER in 48 hours for recheck of his left lower extremity.  I have asked that he return sooner for any new or worsening symptoms  Final Clinical Impressions(s) / ED Diagnoses   Final diagnoses:  Cellulitis of left lower extremity    New Prescriptions New Prescriptions   CLINDAMYCIN (CLEOCIN) 300 MG CAPSULE    Take 1 capsule (300 mg total) by mouth 3 (three) times daily.   HYDROCODONE-ACETAMINOPHEN (NORCO/VICODIN) 5-325 MG TABLET    Take 1 tablet by mouth every 6 (six) hours as needed for moderate pain.     Jola Schmidt, MD 11/13/15 [phone]

## 2015-11-14 NOTE — Telephone Encounter (Signed)
LMTCB

## 2015-11-15 ENCOUNTER — Emergency Department (HOSPITAL_COMMUNITY)
Admission: EM | Admit: 2015-11-15 | Discharge: 2015-11-15 | Disposition: A | Discharge status: Home / Self Care | Payer: Medicare Other | Attending: Emergency Medicine | Admitting: Emergency Medicine

## 2015-11-15 ENCOUNTER — Encounter (HOSPITAL_COMMUNITY): Payer: Self-pay | Admitting: Emergency Medicine

## 2015-11-15 DIAGNOSIS — E119 Type 2 diabetes mellitus without complications: Secondary | ICD-10-CM | POA: Diagnosis not present

## 2015-11-15 DIAGNOSIS — Z7951 Long term (current) use of inhaled steroids: Secondary | ICD-10-CM | POA: Diagnosis not present

## 2015-11-15 DIAGNOSIS — I110 Hypertensive heart disease with heart failure: Secondary | ICD-10-CM | POA: Insufficient documentation

## 2015-11-15 DIAGNOSIS — L03116 Cellulitis of left lower limb: Secondary | ICD-10-CM

## 2015-11-15 DIAGNOSIS — Z79899 Other long term (current) drug therapy: Secondary | ICD-10-CM | POA: Diagnosis not present

## 2015-11-15 DIAGNOSIS — Z87891 Personal history of nicotine dependence: Secondary | ICD-10-CM | POA: Diagnosis not present

## 2015-11-15 DIAGNOSIS — I5032 Chronic diastolic (congestive) heart failure: Secondary | ICD-10-CM | POA: Diagnosis not present

## 2015-11-15 DIAGNOSIS — J45909 Unspecified asthma, uncomplicated: Secondary | ICD-10-CM | POA: Insufficient documentation

## 2015-11-15 NOTE — ED Provider Notes (Signed)
[facility] DEPT Provider Note   CSN: [id number] Arrival date & time: 11/15/15  1004     History   Chief Complaint Chief Complaint  Patient presents with  . Follow-up  . Cellulitis    HPI Thomas Ramos is a 45 y.o. male.  The history is provided by the patient. No language interpreter was used.   Thomas Ramos is a 45 y.o. male who presents to the Emergency Department complaining of cellulitis re check.  He has a history of cerebral palsy and cellulitis to the left leg. He was admitted one month ago for cellulitis. Since discharge from the hospital he has experienced is chronic swelling in the left leg. Over the last few days he's had increased pain in the left leg. He was seen in the emergency department 2 days ago and turned on antibiotics for cellulitis. He presents today for leg recheck. He states that overall his pain is improving. He denies any fevers, chest pain, shortness of breath. He is taking his medications as prescribed. No additional complaints.  Past Medical History:  Diagnosis Date  . Anxiety   . Cerebral palsy ([place])   . Chronic diastolic (congestive) heart failure ([place])   . CP (cerebral palsy) ([place])   . Diabetes mellitus without complication (HCC)    borderline  . Environmental allergies    takes inhalers if needed  . Esophageal stricture   . GERD (gastroesophageal reflux disease)   . Hypertension   . Motility disorder, esophageal   . Pneumonia   . 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 S/P Botox injection    approx every 4 months  . Seasonal allergies     Patient Active Problem List   Diagnosis Date Noted  . Hyponatremia 10/06/2015  . AKI (acute kidney injury) ([place]) 10/06/2015  . Leukocytosis 10/06/2015  . Nausea & vomiting 10/06/2015  . Chronic diastolic (congestive) heart failure ([place])   . Leg swelling   . Community acquired pneumonia 09/20/2015  . Depression with anxiety 09/20/2015  . Cellulitis of left leg 09/20/2015  . CAP (community  acquired pneumonia) 09/20/2015  . Hypersomnia 07/16/2015  . Dyspnea 07/16/2015  . Asthma 07/16/2015  . GERD (gastroesophageal reflux disease) 07/16/2015  . Cerebral palsy ([place]) 07/16/2015  . Hematemesis with nausea   . Influenza with pneumonia   . Aspiration pneumonia ([place]) 05/24/2014  . HTN (hypertension) 05/24/2014  . Fever 05/24/2014  . Dysphagia 09/16/2010  . 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 BOTOX INJECTION N/A 12/21/2012   Procedure: BOTOX INJECTION;  Surgeon: Inda Castle, MD;  Location: [facility];  Service: Endoscopy;  Laterality: N/A;  . BOTOX INJECTION N/A 06/28/2014   Procedure: BOTOX INJECTION;  Surgeon: Inda Castle, MD;  Location: [facility];  Service: Endoscopy;  Laterality: N/A;  . ESOPHAGOGASTRODUODENOSCOPY N/A 12/21/2012   Procedure: ESOPHAGOGASTRODUODENOSCOPY (EGD);  Surgeon: Inda Castle, MD;  Location: [facility];  Service: Endoscopy;  Laterality: N/A;  . ESOPHAGOGASTRODUODENOSCOPY N/A 06/28/2014   Procedure: ESOPHAGOGASTRODUODENOSCOPY (EGD);  Surgeon: Inda Castle, MD;  Location: [facility];  Service: Endoscopy;  Laterality: N/A;  . ESOPHAGUS SURGERY     stretched esophagus  . legs    . MOUTH SURGERY    . TENDON RELEASE         Home Medications    Prior to Admission medications   Medication Sig Start Date End Date Taking? Authorizing Provider  albuterol (PROVENTIL HFA;VENTOLIN HFA) 108 (90  Base) MCG/ACT inhaler Inhale 1-2 puffs into the lungs every 6 (six) hours as needed for wheezing or shortness of breath. 08/26/15  Yes Ripley Fraise, MD  amLODipine (NORVASC) 10 MG tablet Take 10 mg by mouth daily.   Yes Historical Provider, MD  baclofen (LIORESAL) 10 MG tablet Take 10 mg by mouth 3 (three) times daily as needed. For neck stiffness/pain. 08/30/15  Yes Historical Provider, MD  busPIRone (BUSPAR) 7.5 MG tablet Take 7.5 mg by mouth 2 (two) times daily as needed (for  anxiousness).    Yes Historical Provider, MD  cetirizine (ZYRTEC) 10 MG tablet Take 10 mg by mouth daily.  03/01/14  Yes Historical Provider, MD  citalopram (CELEXA) 20 MG tablet Take 20 mg by mouth every evening.   Yes Historical Provider, MD  clindamycin (CLEOCIN) 300 MG capsule Take 1 capsule (300 mg total) by mouth 3 (three) times daily. 11/13/15  Yes Jola Schmidt, MD  CRESTOR 5 MG tablet Take 5 mg by mouth at bedtime. 04/22/14  Yes Historical Provider, MD  fluticasone (FLONASE) 50 MCG/ACT nasal spray Place 1 spray into both nostrils daily as needed for allergies.  03/01/14  Yes Historical Provider, MD  furosemide (LASIX) 20 MG tablet Take 20 mg by mouth daily. 10/17/15  Yes Historical Provider, MD  HYDROcodone-acetaminophen (NORCO/VICODIN) 5-325 MG tablet Take 1 tablet by mouth every 6 (six) hours as needed for moderate pain. 11/13/15  Yes Jola Schmidt, MD  montelukast (SINGULAIR) 10 MG tablet Take 10 mg by mouth daily.   Yes Historical Provider, MD  nitroGLYCERIN (NITROSTAT) 0.4 MG SL tablet Place 0.4 mg under the tongue every 5 (five) minutes as needed. For chest pain. 08/26/15  Yes Historical Provider, MD  omeprazole (PRILOSEC) 20 MG capsule Take 1 capsule (20 mg total) by mouth daily. 08/11/15  Yes April Palumbo, MD  potassium chloride (K-DUR) 10 MEQ tablet Take 10 mEq by mouth daily. 11/11/15  Yes Historical Provider, MD  ranitidine (ZANTAC) 150 MG tablet Take 150 mg by mouth 2 (two) times daily as needed. For heartburn. 08/26/15  Yes Historical Provider, MD  valsartan (DIOVAN) 80 MG tablet Take 80 mg by mouth daily. 08/26/15  Yes Historical Provider, MD    Family History Family History  Problem Relation Age of Onset  . Hypertension Father   . Lung cancer Father   . Diabetes Mother     Social History Social History  Substance Use Topics  . Smoking status: Former Research scientist (life sciences)  . Smokeless tobacco: Former Systems developer     Comment: smoked one time  . Alcohol use 0.0 oz/week     Comment: 2 beers every  other Friday and Saturday     Allergies   Sulfamethoxazole-trimethoprim; Metronidazole; and Penicillins   Review of Systems Review of Systems  All other systems reviewed and are negative.    Physical Exam Updated Vital Signs BP (!) 136/102 (BP Location: Left Arm)   Pulse 88   Temp 97.5 F (36.4 C) (Oral)   Resp 18   Ht 5' (1.524 m)   Wt 180 lb (81.6 kg)   SpO2 99%   BMI 35.15 kg/m   Physical Exam  Constitutional: He is oriented to person, place, and time. He appears well-nourished. No distress.  HENT:  Head: Atraumatic.  Cardiovascular: Normal rate and regular rhythm.   No murmur heard. Pulmonary/Chest: Effort normal and breath sounds normal. No respiratory distress.  Musculoskeletal:  1+ pitting edema to the right lower extremity. 2+ pitting edema to the left lower extremity.  There is erythema to the left lateral leg approximately 8 x 10 cm in size with hemosiderin staining. No wounds or drainage. Contractures to bilateral feet. 2+ DP pulses bilaterally.  Neurological: He is alert and oriented to person, place, and time.  Skin: Skin is warm and dry. Capillary refill takes less than 2 seconds.  Psychiatric: He has a normal mood and affect. His behavior is normal.  Nursing note and vitals reviewed.    ED Treatments / Results  Labs (all labs ordered are listed, but only abnormal results are displayed) Labs Reviewed - No data to display  EKG  EKG Interpretation None       Radiology No results found.  Procedures Procedures (including critical care time)  Medications Ordered in ED Medications - No data to display   Initial Impression / Assessment and Plan / ED Course  I have reviewed the triage vital signs and the nursing notes.  Pertinent labs & imaging results that were available during my care of the patient were reviewed by me and considered in my medical decision making (see chart for details).  Clinical Course    Patient here for leg recheck,  recently diagnosed with cellulitis 2 days ago and started on clindamycin. Prior ED visit records reviewed, he had a vascular ultrasound on August 30 and was negative for DVT. Overall he states his pain and redness have improved. Presentation is consistent with improving cellulitis, do not feel additional lab work is warranted at this time. Discussed continuing his antibiotics as previously prescribed with outpatient follow-up. Home care and return precautions discussed.  Final Clinical Impressions(s) / ED Diagnoses   Final diagnoses:  Cellulitis of left lower extremity    New Prescriptions Discharge Medication List as of 11/15/2015 11:23 AM       Quintella Reichert, MD 11/15/15 [id number]

## 2015-11-15 NOTE — ED Notes (Signed)
Rees MD at bedside  

## 2015-11-15 NOTE — ED Triage Notes (Signed)
Pt reports here for follow up of LE cellulitis.

## 2015-11-15 NOTE — Discharge Instructions (Signed)
Follow up with your family doctor on Monday for recheck, call for appointment.  Take your antibiotics as directed.

## 2015-11-19 ENCOUNTER — Institutional Professional Consult (permissible substitution): Payer: Medicare Other | Admitting: Pulmonary Disease

## 2015-11-22 ENCOUNTER — Telehealth: Payer: Self-pay | Admitting: Pulmonary Disease

## 2015-11-22 DIAGNOSIS — G4733 Obstructive sleep apnea (adult) (pediatric): Secondary | ICD-10-CM

## 2015-11-22 NOTE — Telephone Encounter (Signed)
See 11/10/15 phone note about results. Although it is signed closed.  Please call the pt and tell the pt the Robinette  showed OSA.   Pt stops breathing 28   times an hour.    Please order autoCPAP 5-15 cm H2O. Patient will need a mask fitting session. Patient will need a 1 month download.   Patient needs to be seen by me or any of the NPs/APPs  4-6 weeks after obtaining the cpap machine. Let me know if you receive this.   Thanks! -----  LMTCB x1

## 2015-11-26 NOTE — Telephone Encounter (Signed)
Results have been explained to patient, patient aware that we are sending an order to DME for his CPAP machine.  Pt very hard to understand d/t his cerebral palsy and his phone having a lot of static. Sheena spoke with the patient and was able to understand him a little better and was able to tell him to keep his upcoming appt with AD 12/11/15. Pt seemed to understand that he is getting a CPAP machine for his breathing at night. Order placed.  Nothing further needed.

## 2015-11-26 NOTE — Telephone Encounter (Signed)
Ashtyn attempted to discuss results with pt via phone. Pt's cell phone had a lot of static and it was hard to hear him. I spoke with pt and he wanted to know when his appt was. Advised pt of appt date/time and that we would review SST results at that time.  AD - FYI we were not able to communicate results to pt and he had a lot of questions. Phone connection was very bad and pt is very difficult to understand. Will need to review results at appt. Thanks!

## 2015-11-26 NOTE — Telephone Encounter (Signed)
Yes, results to be reviewed at office visit. He lives alone per LOV note, so not sure about caregiver.  Thanks!

## 2015-11-26 NOTE — Telephone Encounter (Signed)
   Just to clarify, can I just talk to the pt re: results when I see him?  I think he has a caregiver correct?   Thanks.  Monica Becton, MD 11/26/2015, 1:58 PM [facility] Pager [phone] After 3 pm or if no answer, call [phone]

## 2015-12-06 ENCOUNTER — Ambulatory Visit: Payer: Medicare Other | Admitting: Pulmonary Disease

## 2015-12-11 ENCOUNTER — Encounter: Payer: Self-pay | Admitting: Pulmonary Disease

## 2015-12-11 ENCOUNTER — Telehealth: Payer: Self-pay | Admitting: Pulmonary Disease

## 2015-12-11 ENCOUNTER — Ambulatory Visit (INDEPENDENT_AMBULATORY_CARE_PROVIDER_SITE_OTHER): Payer: Medicare Other | Admitting: Pulmonary Disease

## 2015-12-11 VITALS — BP 118/78 | HR 99

## 2015-12-11 DIAGNOSIS — G4733 Obstructive sleep apnea (adult) (pediatric): Secondary | ICD-10-CM | POA: Diagnosis not present

## 2015-12-11 DIAGNOSIS — K219 Gastro-esophageal reflux disease without esophagitis: Secondary | ICD-10-CM

## 2015-12-11 DIAGNOSIS — J4520 Mild intermittent asthma, uncomplicated: Secondary | ICD-10-CM

## 2015-12-11 NOTE — Assessment & Plan Note (Signed)
Pt was at the ER 2/2 SOB episodic. Not sure if related to nerves.  He has "wheezing" every now and then. Uses alb MDI 1x/week. Triggers for wheezing : change in weather and pollen. CXR (06/2015)  low lung volumes. Atelectasis at the bases. Likely with asthma. He had difficulty doing PFT. Has not used his albuterol in months.  Observe for now. Alb MDI prn. Cont singulair.

## 2015-12-11 NOTE — Assessment & Plan Note (Signed)
Has snoring. Has gasping or choking. Denies witnessed apneas.  Sleeps 11 hrs. Has awakenings at night.  Has hypersomnia in am. Occasional naps.   Sleep lab study : AHI 28.  Patient has cerebral palsy.  Plan:  We extensively discussed the diagnosis, pathophysiology, and treatment options for Obstructive Sleep Apnea (OSA).  We discussed treatment options for OSA including CPAP, BiPaP, as well as surgical options and oral devices.   We will start patient on autocpap 5-15 cm water. He has cerebral palsy. He has help 24/7. We will instruct DME to make sure his help knows how to set up the CPAP machine. He will need help putting on the CPAP machine. Anticipate no issues with CPAP. Then again, he asked about alternative treatment. I said let's give CPAP a chance.  Patient was instructed to call the office if he/she has not received the PAP device in 1-2 weeks.  Patient was instructed to have mask, tubings, filter, reservoir cleaned at least once a week with soapy water.  Patient was instructed to call the office if he/she is having issues with the PAP device.    I advised patient to obtain sufficient amount of sleep --  7 to 8 hours at least in a 24 hr period.  Patient was advised to follow good sleep hygiene.  Patient was advised NOT to engage in activities requiring concentration and/or vigilance if he/she is and  sleepy.  Patient is NOT to drive if he/she is sleepy.

## 2015-12-11 NOTE — Assessment & Plan Note (Signed)
cont PPI at HS. Asp precaution. Recent cough and SOB when he lays flat. HOB 30 degrees.

## 2015-12-11 NOTE — Telephone Encounter (Signed)
Called spoke with Angola from [facility].  Pt CPAP machine was not ordered. She reports pt needed OV. I advised her pt was seen back in May, sleep study done end of august, results given 9/8. We placed order for CPAP as well on 9/8. GIlda then stated that she could use the OV notes from may.  FYI to Dr. Corrie Dandy that pt has an appt today. Pt has not received CPAP machine.

## 2015-12-11 NOTE — Progress Notes (Signed)
Subjective:    Patient ID: Thomas Ramos, male    DOB: [birth year], 45 y.o.   MRN: [id number]  HPI   This is the case of Thomas Ramos, 45 y.o. Male, who was referred by Dr. Anastasia Pall and Roe Coombs PA  in consultation regarding dyspnea  And possible OSA.   As you very well know, patient  Is a non smoker.  He is wheelchair bound 2/2 CP.  He lives by himself. Has help with ADLs.  Has not been admitted to hospital recently.  Recently to ER 2/2 SOB episodic. Not sure if related to nerves.  He has "wheezing" every now and then. Uses alb MDI 1x/week. Triggers for wheezing : change in weather and pollen.  Denies sinus issues.  Has GERD -- better. Has SOB when he lays flat.   Has snoring. Has gasping or choking. Denies witnessed apneas.  Sleeps 11 hrs. Has awakenings at night.  Has hypersomnia in am. Occasional naps.   ROV  12/11/15 Patient had a lab sleep study which showed an AHI of 28. He has not been started in CPAP since he wanted to discuss results of his sleep study. Remains to have snoring, gasping, choking, unrefreshed sleep. Has hypersomnia affecting his functionality. He now has help, 24/7. Someones always with him. His breathing has been stable. He has not used his albuterol recently. Has not been admitted nor been in antibiotics recently.    Review of Systems  Constitutional: Negative.  Negative for fever and unexpected weight change.  HENT: Negative.  Negative for congestion, dental problem, ear pain, nosebleeds, postnasal drip, rhinorrhea, sinus pressure, sneezing, sore throat and trouble swallowing.   Eyes: Negative.  Negative for redness and itching.  Respiratory: Positive for wheezing. Negative for cough, chest tightness and shortness of breath.   Cardiovascular: Negative.  Negative for palpitations and leg swelling.  Gastrointestinal: Negative.  Negative for nausea and vomiting.  Endocrine: Negative.   Genitourinary: Negative.  Negative for dysuria.    Musculoskeletal: Positive for arthralgias. Negative for joint swelling.  Skin: Negative.  Negative for rash.  Allergic/Immunologic: Positive for environmental allergies.  Neurological: Positive for dizziness. Negative for headaches.  Hematological: Negative.  Does not bruise/bleed easily.  Psychiatric/Behavioral: Negative.  Negative for dysphoric mood. The patient is not nervous/anxious.      Objective:   Physical Exam   Vitals:  Vitals:   12/11/15 1526  BP: 118/78  Pulse: 99  SpO2: 96%    Constitutional/General:  Pleasant, well-nourished, well-developed, not in any distress,  Comfortably seating. He is on his wheelchair (motorized).  Well kempt Has RUE contracture (chronic)  There is no height or weight on file to calculate BMI. Wt Readings from Last 3 Encounters:  11/15/15 180 lb (81.6 kg)  11/08/15 180 lb (81.6 kg)  10/08/15 180 lb 5 oz (81.8 kg)      HEENT: Pupils equal and reactive to light and accommodation. Anicteric sclerae. Normal nasal mucosa.   No oral  lesions,  mouth clear,  oropharynx clear, no postnasal drip. (-) Oral thrush. No dental caries.  Airway - Mallampati class III-IV  Neck: No masses. Midline trachea. No JVD, (-) LAD. (-) bruits appreciated.  Respiratory/Chest: Grossly normal chest. (-) deformity. (-) Accessory muscle use.  Symmetric expansion. (-) Tenderness on palpation.  Resonant on percussion.  Diminished BS on both lower lung zones. (-) wheezing, crackles, rhonchi (-) egophony  Cardiovascular: Regular rate and  rhythm, heart sounds normal, no murmur or gallops, no peripheral edema  Gastrointestinal:  Normal bowel sounds. Soft, non-tender. No hepatosplenomegaly.  (-) masses.   Musculoskeletal:  Normal muscle tone. Normal gait.   Extremities: Grossly normal. (-) clubbing, cyanosis.  (-) edema  Skin: (-) rash,lesions seen.   Neurological/Psychiatric : alert, oriented to time, place, person. Normal mood and affect            Assessment & Plan:  OSA (obstructive sleep apnea) Has snoring. Has gasping or choking. Denies witnessed apneas.  Sleeps 11 hrs. Has awakenings at night.  Has hypersomnia in am. Occasional naps.   Sleep lab study : AHI 28.  Patient has cerebral palsy.  Plan:  We extensively discussed the diagnosis, pathophysiology, and treatment options for Obstructive Sleep Apnea (OSA).  We discussed treatment options for OSA including CPAP, BiPaP, as well as surgical options and oral devices.   We will start patient on autocpap 5-15 cm water. He has cerebral palsy. He has help 24/7. We will instruct DME to make sure his help knows how to set up the CPAP machine. He will need help putting on the CPAP machine. Anticipate no issues with CPAP. Then again, he asked about alternative treatment. I said let's give CPAP a chance.  Patient was instructed to call the office if he/she has not received the PAP device in 1-2 weeks.  Patient was instructed to have mask, tubings, filter, reservoir cleaned at least once a week with soapy water.  Patient was instructed to call the office if he/she is having issues with the PAP device.    I advised patient to obtain sufficient amount of sleep --  7 to 8 hours at least in a 24 hr period.  Patient was advised to follow good sleep hygiene.  Patient was advised NOT to engage in activities requiring concentration and/or vigilance if he/she is and  sleepy.  Patient is NOT to drive if he/she is sleepy.      GERD (gastroesophageal reflux disease) cont PPI at HS. Asp precaution. Recent cough and SOB when he lays flat. HOB 30 degrees.   Asthma Pt was at the ER 2/2 SOB episodic. Not sure if related to nerves.  He has "wheezing" every now and then. Uses alb MDI 1x/week. Triggers for wheezing : change in weather and pollen. CXR (06/2015)  low lung volumes. Atelectasis at the bases. Likely with asthma. He had difficulty doing PFT. Has not used his albuterol in months.   Observe for now. Alb MDI prn. Cont singulair.     Return to clinic in 8 weeks.  Monica Becton, MD 12/11/2015, 3:55 PM [name] Pager [phone] After 3 pm or if no answer, call [phone]

## 2015-12-11 NOTE — Patient Instructions (Signed)
  It was a pleasure taking care of you today!  You are diagnosed with Obstructive Sleep Apnea or OSA.  You stop breathing  28   times an hour.   We will order you an autoCPAP  machine.  Please call the office if you do NOT receive your machine in the next 1-2 weeks.   Please make sure you use your CPAP device everytime you sleep.  We will monitor the usage of your machine per your insurance requirement.  Your insurance company may take the machine from you if you are not using it regularly.   Please clean the mask, tubings, filter, water reservoir with soapy water every week.  Please use distilled water for the water reservoir.   Please call the office or your machine provider (DME company) if you are having issues with the device.   Return to clinic in 6-8 weeks with Dr. Corrie Dandy.

## 2016-01-13 ENCOUNTER — Inpatient Hospital Stay (HOSPITAL_COMMUNITY)
Admission: EM | Admit: 2016-01-13 | Discharge: 2016-01-15 | DRG: 603 | Disposition: A | Discharge status: Home Health | Payer: Medicare Other | Attending: Family Medicine | Admitting: Family Medicine

## 2016-01-13 ENCOUNTER — Encounter (HOSPITAL_COMMUNITY): Payer: Self-pay | Admitting: Emergency Medicine

## 2016-01-13 ENCOUNTER — Emergency Department (HOSPITAL_BASED_OUTPATIENT_CLINIC_OR_DEPARTMENT_OTHER)
Admission: EM | Admit: 2016-01-13 | Discharge: 2016-01-13 | Disposition: A | Discharge status: Home / Self Care | Payer: Medicare Other | Source: Home / Self Care | Attending: Emergency Medicine | Admitting: Emergency Medicine

## 2016-01-13 DIAGNOSIS — G809 Cerebral palsy, unspecified: Secondary | ICD-10-CM | POA: Diagnosis present

## 2016-01-13 DIAGNOSIS — I10 Essential (primary) hypertension: Secondary | ICD-10-CM

## 2016-01-13 DIAGNOSIS — Z880 Allergy status to penicillin: Secondary | ICD-10-CM

## 2016-01-13 DIAGNOSIS — L0390 Cellulitis, unspecified: Secondary | ICD-10-CM | POA: Diagnosis present

## 2016-01-13 DIAGNOSIS — M7989 Other specified soft tissue disorders: Secondary | ICD-10-CM | POA: Diagnosis not present

## 2016-01-13 DIAGNOSIS — Z8249 Family history of ischemic heart disease and other diseases of the circulatory system: Secondary | ICD-10-CM

## 2016-01-13 DIAGNOSIS — L03116 Cellulitis of left lower limb: Secondary | ICD-10-CM | POA: Diagnosis not present

## 2016-01-13 DIAGNOSIS — I5032 Chronic diastolic (congestive) heart failure: Secondary | ICD-10-CM | POA: Diagnosis present

## 2016-01-13 DIAGNOSIS — E119 Type 2 diabetes mellitus without complications: Secondary | ICD-10-CM | POA: Diagnosis present

## 2016-01-13 DIAGNOSIS — Z882 Allergy status to sulfonamides status: Secondary | ICD-10-CM

## 2016-01-13 DIAGNOSIS — Z801 Family history of malignant neoplasm of trachea, bronchus and lung: Secondary | ICD-10-CM

## 2016-01-13 DIAGNOSIS — Z993 Dependence on wheelchair: Secondary | ICD-10-CM

## 2016-01-13 DIAGNOSIS — I110 Hypertensive heart disease with heart failure: Secondary | ICD-10-CM | POA: Diagnosis present

## 2016-01-13 DIAGNOSIS — Z833 Family history of diabetes mellitus: Secondary | ICD-10-CM

## 2016-01-13 DIAGNOSIS — Z888 Allergy status to other drugs, medicaments and biological substances status: Secondary | ICD-10-CM

## 2016-01-13 DIAGNOSIS — G808 Other cerebral palsy: Secondary | ICD-10-CM | POA: Diagnosis present

## 2016-01-13 LAB — CBC WITH DIFFERENTIAL/PLATELET
BASOS ABS: 0 10*3/uL (ref 0.0–0.1)
BASOS PCT: 0 %
EOS ABS: 0.1 10*3/uL (ref 0.0–0.7)
EOS PCT: 2 %
HCT: 37.5 % — ABNORMAL LOW (ref 39.0–52.0)
Hemoglobin: 12.3 g/dL — ABNORMAL LOW (ref 13.0–17.0)
Lymphocytes Relative: 29 %
Lymphs Abs: 1.8 10*3/uL (ref 0.7–4.0)
MCH: 26.6 pg (ref 26.0–34.0)
MCHC: 32.8 g/dL (ref 30.0–36.0)
MCV: 81 fL (ref 78.0–100.0)
MONO ABS: 0.4 10*3/uL (ref 0.1–1.0)
MONOS PCT: 7 %
Neutro Abs: 3.8 10*3/uL (ref 1.7–7.7)
Neutrophils Relative %: 62 %
PLATELETS: 225 10*3/uL (ref 150–400)
RBC: 4.63 MIL/uL (ref 4.22–5.81)
RDW: 13.2 % (ref 11.5–15.5)
WBC: 6.2 10*3/uL (ref 4.0–10.5)

## 2016-01-13 LAB — BASIC METABOLIC PANEL
ANION GAP: 10 (ref 5–15)
BUN: 14 mg/dL (ref 6–20)
CALCIUM: 9.2 mg/dL (ref 8.9–10.3)
CO2: 25 mmol/L (ref 22–32)
CREATININE: 0.79 mg/dL (ref 0.61–1.24)
Chloride: 104 mmol/L (ref 101–111)
Glucose, Bld: 187 mg/dL — ABNORMAL HIGH (ref 65–99)
Potassium: 3.7 mmol/L (ref 3.5–5.1)
Sodium: 139 mmol/L (ref 135–145)

## 2016-01-13 LAB — APTT: APTT: 35 s (ref 24–36)

## 2016-01-13 LAB — D-DIMER, QUANTITATIVE: D-Dimer, Quant: 0.4 ug/mL-FEU (ref 0.00–0.50)

## 2016-01-13 LAB — PROTIME-INR
INR: 1.07
PROTHROMBIN TIME: 13.9 s (ref 11.4–15.2)

## 2016-01-13 MED ORDER — SODIUM CHLORIDE 0.9 % IV SOLN
INTRAVENOUS | Status: DC
  Administered 2016-01-13: 19:00:00 via INTRAVENOUS

## 2016-01-13 MED ORDER — HEPARIN (PORCINE) IN NACL 100-0.45 UNIT/ML-% IJ SOLN
1050.0000 [IU]/h | INTRAMUSCULAR | Status: DC
  Administered 2016-01-13: 1050 [IU]/h via INTRAVENOUS
  Filled 2016-01-13: qty 250

## 2016-01-13 MED ORDER — ALBUTEROL SULFATE (2.5 MG/3ML) 0.083% IN NEBU: 2.5000 mg | INHALATION_SOLUTION | Freq: Four times a day (QID) | RESPIRATORY_TRACT | Status: DC | PRN

## 2016-01-13 MED ORDER — CLINDAMYCIN PHOSPHATE 600 MG/50ML IV SOLN
600.0000 mg | Freq: Three times a day (TID) | INTRAVENOUS | Status: DC
  Administered 2016-01-13 – 2016-01-15 (×5): 600 mg via INTRAVENOUS
  Filled 2016-01-13 (×5): qty 50

## 2016-01-13 MED ORDER — AMLODIPINE BESYLATE 10 MG PO TABS
10.0000 mg | ORAL_TABLET | Freq: Every day | ORAL | Status: DC
  Administered 2016-01-14 – 2016-01-15 (×2): 10 mg via ORAL
  Filled 2016-01-13 (×2): qty 1

## 2016-01-13 MED ORDER — ROSUVASTATIN CALCIUM 5 MG PO TABS
5.0000 mg | ORAL_TABLET | Freq: Every day | ORAL | Status: DC
  Administered 2016-01-13 – 2016-01-14 (×2): 5 mg via ORAL
  Filled 2016-01-13 (×2): qty 1

## 2016-01-13 MED ORDER — CLINDAMYCIN PHOSPHATE 900 MG/50ML IV SOLN
900.0000 mg | Freq: Once | INTRAVENOUS | Status: AC
  Administered 2016-01-13: 900 mg via INTRAVENOUS
  Filled 2016-01-13: qty 50

## 2016-01-13 MED ORDER — MONTELUKAST SODIUM 10 MG PO TABS: 10.0000 mg | ORAL_TABLET | Freq: Every day | ORAL | Status: DC | PRN

## 2016-01-13 MED ORDER — BUSPIRONE HCL 5 MG PO TABS: 7.5000 mg | ORAL_TABLET | Freq: Two times a day (BID) | ORAL | Status: DC | PRN

## 2016-01-13 MED ORDER — LORATADINE 10 MG PO TABS
10.0000 mg | ORAL_TABLET | Freq: Every day | ORAL | Status: DC
  Administered 2016-01-14 – 2016-01-15 (×2): 10 mg via ORAL
  Filled 2016-01-13 (×2): qty 1

## 2016-01-13 MED ORDER — BACLOFEN 10 MG PO TABS: 10.0000 mg | ORAL_TABLET | Freq: Three times a day (TID) | ORAL | Status: DC | PRN

## 2016-01-13 MED ORDER — HEPARIN BOLUS VIA INFUSION
3000.0000 [IU] | Freq: Once | INTRAVENOUS | Status: AC
  Administered 2016-01-13: 3000 [IU] via INTRAVENOUS
  Filled 2016-01-13: qty 3000

## 2016-01-13 MED ORDER — FAMOTIDINE 20 MG PO TABS
10.0000 mg | ORAL_TABLET | Freq: Two times a day (BID) | ORAL | Status: DC
  Administered 2016-01-13 – 2016-01-14 (×2): 10 mg via ORAL
  Filled 2016-01-13 (×2): qty 1

## 2016-01-13 MED ORDER — IRBESARTAN 75 MG PO TABS
37.5000 mg | ORAL_TABLET | Freq: Every day | ORAL | Status: DC
  Administered 2016-01-14 – 2016-01-15 (×2): 37.5 mg via ORAL
  Filled 2016-01-13 (×2): qty 0.5

## 2016-01-13 MED ORDER — ENOXAPARIN SODIUM 40 MG/0.4ML ~~LOC~~ SOLN
40.0000 mg | SUBCUTANEOUS | Status: DC
  Administered 2016-01-13 – 2016-01-14 (×2): 40 mg via SUBCUTANEOUS
  Filled 2016-01-13 (×2): qty 0.4

## 2016-01-13 MED ORDER — POTASSIUM CHLORIDE ER 10 MEQ PO TBCR
10.0000 meq | EXTENDED_RELEASE_TABLET | Freq: Every day | ORAL | Status: DC
  Administered 2016-01-14 – 2016-01-15 (×2): 10 meq via ORAL
  Filled 2016-01-13 (×4): qty 1

## 2016-01-13 MED ORDER — CITALOPRAM HYDROBROMIDE 20 MG PO TABS
20.0000 mg | ORAL_TABLET | Freq: Every evening | ORAL | Status: DC
  Administered 2016-01-13 – 2016-01-14 (×2): 20 mg via ORAL
  Filled 2016-01-13 (×2): qty 1

## 2016-01-13 MED ORDER — HYDROCODONE-ACETAMINOPHEN 5-325 MG PO TABS
1.0000 | ORAL_TABLET | Freq: Four times a day (QID) | ORAL | Status: DC | PRN
Start: 2016-01-13 — End: 2016-01-15

## 2016-01-13 MED ORDER — CLINDAMYCIN PHOSPHATE 900 MG/50ML IV SOLN: 900.0000 mg | Freq: Once | INTRAVENOUS | Status: DC

## 2016-01-13 MED ORDER — SODIUM CHLORIDE 0.9 % IV BOLUS (SEPSIS)
1000.0000 mL | Freq: Once | INTRAVENOUS | Status: AC
Start: 2016-01-13 — End: 2016-01-13
  Administered 2016-01-13: 1000 mL via INTRAVENOUS

## 2016-01-13 MED ORDER — FLUTICASONE PROPIONATE 50 MCG/ACT NA SUSP: 1.0000 | Freq: Every day | NASAL | Status: DC | PRN

## 2016-01-13 NOTE — ED Triage Notes (Signed)
Pt began to have L lower leg redness and swelling this am. No known injury. Pt has been seen here for same before. Redness and swelling decreases then returns. Hx of CP and paraplegia.

## 2016-01-13 NOTE — Progress Notes (Signed)
**  Preliminary report by tech**  Left lower extremity venous duplex completed. The study was technically limited due to edema, depth of vessels, immobility, acoustic shadow, and patient's contracted state.  There is no obvious evidence of deep or superficial vein thrombosis involving the left lower extremity. There is no evidence of a Baker's cyst on the left. Results given to Dr. Dayna Barker.  01/13/16 3:28 PM Carlos Levering RVT

## 2016-01-13 NOTE — ED Notes (Signed)
Vascular US at bedside.

## 2016-01-13 NOTE — H&P (Signed)
History and Physical    Thomas Ramos [id number] DOB: [birth year] DOA: 01/13/2016  PCP: Chesley Noon, MD  Patient coming from: Home  Chief Complaint:  HPI: Thomas Ramos is a 45 y.o. male with medical history significant of congenital cerebral palsy, wheelchair bound, chronic diastolic heart failure, illicit reflux, hypertension, left leg cellulitis about a month ago when he was treated with  antibiotics presented with left leg swelling associated with the redness is started since this morning. The patient was accompanied by his caretaker at bedside. They reported that this morning left leg has mild erythema which were shown associated with swelling and tenderness. Patient denied fever, chills, headache, dizziness, nausea, vomiting, chest pain, shortness of breath, abdominal pain, dysuria, urgency, diarrhea or constipation. No sick contact. He works in the office.  ED Course: In the ER, Doppler ultrasound of left lower extremity was done which was preliminarily reported as negative for DVT. Started on systemic anticoagulation with heparin for clinical suspicious of DVT. Also treated with IV clindamycin for possible cellulitis. Admitted for further evaluation.   Review of Systems: As per HPI otherwise 10 point review of systems negative.    Past Medical History:  Diagnosis Date  . Anxiety   . Cerebral palsy ([place])   . Chronic diastolic (congestive) heart failure   . CP (cerebral palsy) ([place])   . Diabetes mellitus without complication (HCC)    borderline  . Environmental allergies    takes inhalers if needed  . Esophageal stricture   . GERD (gastroesophageal reflux disease)   . Hypertension   . Motility disorder, esophageal   . Pneumonia   . 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[place])   . S/P Botox injection    approx every 4 months  . Seasonal allergies     Past Surgical History:  Procedure Laterality Date  . BOTOX INJECTION N/A 12/21/2012   Procedure: BOTOX INJECTION;  Surgeon:  Inda Castle, MD;  Location: [facility];  Service: Endoscopy;  Laterality: N/A;  . BOTOX INJECTION N/A 06/28/2014   Procedure: BOTOX INJECTION;  Surgeon: Inda Castle, MD;  Location: [place];  Service: Endoscopy;  Laterality: N/A;  . ESOPHAGOGASTRODUODENOSCOPY N/A 12/21/2012   Procedure: ESOPHAGOGASTRODUODENOSCOPY (EGD);  Surgeon: Inda Castle, MD;  Location: [facility];  Service: Endoscopy;  Laterality: N/A;  . ESOPHAGOGASTRODUODENOSCOPY N/A 06/28/2014   Procedure: ESOPHAGOGASTRODUODENOSCOPY (EGD);  Surgeon: Inda Castle, MD;  Location: [place];  Service: Endoscopy;  Laterality: N/A;  . ESOPHAGUS SURGERY     stretched esophagus  . legs    . MOUTH SURGERY    . TENDON RELEASE       reports that he has quit smoking. He has quit using smokeless tobacco. He reports that he drinks alcohol. He reports that he does not use drugs.  Allergies  Allergen Reactions  . Sulfamethoxazole-Trimethoprim Nausea And Vomiting  . Metronidazole Nausea And Vomiting  . Penicillins Hives and Nausea And Vomiting    Has patient had a PCN reaction causing immediate rash, facial/tongue/throat swelling, SOB or lightheadedness with hypotension: yes Has patient had a PCN reaction causing severe rash involving mucus membranes or skin necrosis: no Has patient had a PCN reaction that required hospitalization: no Has patient had a PCN reaction occurring within the last 10 years: no If all of the above answers are "NO", then may proceed with Cephalosporin use.     Family History  Problem Relation Age of Onset  . Hypertension Father   . Lung cancer Father   .  Diabetes Mother      Prior to Admission medications   Medication Sig Start Date End Date Taking? Authorizing Provider  amLODipine (NORVASC) 10 MG tablet Take 10 mg by mouth daily.   Yes Historical Provider, MD  baclofen (LIORESAL) 10 MG tablet Take 10 mg by mouth 3 (three) times daily as needed for muscle spasms. For neck stiffness/pain.  08/30/15  Yes Historical Provider, MD  cetirizine (ZYRTEC) 10 MG tablet Take 10 mg by mouth daily.  03/01/14  Yes Historical Provider, MD  citalopram (CELEXA) 20 MG tablet Take 20 mg by mouth every evening.   Yes Historical Provider, MD  CRESTOR 5 MG tablet Take 5 mg by mouth at bedtime. 04/22/14  Yes Historical Provider, MD  furosemide (LASIX) 20 MG tablet Take 20 mg by mouth daily. 10/17/15  Yes Historical Provider, MD  omeprazole (PRILOSEC) 20 MG capsule Take 1 capsule (20 mg total) by mouth daily. 08/11/15  Yes April Palumbo, MD  potassium chloride (K-DUR) 10 MEQ tablet Take 10 mEq by mouth daily. 11/11/15  Yes Historical Provider, MD  valsartan (DIOVAN) 80 MG tablet Take 80 mg by mouth daily. 08/26/15  Yes Historical Provider, MD  albuterol (PROVENTIL HFA;VENTOLIN HFA) 108 (90 Base) MCG/ACT inhaler Inhale 1-2 puffs into the lungs every 6 (six) hours as needed for wheezing or shortness of breath. 08/26/15   Ripley Fraise, MD  busPIRone (BUSPAR) 7.5 MG tablet Take 7.5 mg by mouth 2 (two) times daily as needed (for anxiousness).     Historical Provider, MD  clindamycin (CLEOCIN) 300 MG capsule Take 1 capsule (300 mg total) by mouth 3 (three) times daily. Patient not taking: Reported on 01/13/2016 11/13/15   Jola Schmidt, MD  fluticasone [facility]) 50 MCG/ACT nasal spray Place 1 spray into both nostrils daily as needed for allergies.  03/01/14   Historical Provider, MD  HYDROcodone-acetaminophen (NORCO/VICODIN) 5-325 MG tablet Take 1 tablet by mouth every 6 (six) hours as needed for moderate pain. 11/13/15   Jola Schmidt, MD  montelukast (SINGULAIR) 10 MG tablet Take 10 mg by mouth daily as needed (allergies).     Historical Provider, MD  nitroGLYCERIN (NITROSTAT) 0.4 MG SL tablet Place 0.4 mg under the tongue every 5 (five) minutes as needed. For chest pain. 08/26/15   Historical Provider, MD  ranitidine (ZANTAC) 150 MG tablet Take 150 mg by mouth 2 (two) times daily as needed. For heartburn. 08/26/15    Historical Provider, MD    Physical Exam: Vitals:   01/13/16 1356 01/13/16 1552 01/13/16 1607  BP: 145/83 116/80   Pulse: 116 80   Resp: 16 18   Temp: 97.4 F (36.3 C)    TempSrc: Oral    SpO2: 97% 99%   Weight:   81.6 kg (180 lb)      Constitutional: NAD, calm, comfortable Vitals:   01/13/16 1356 01/13/16 1552 01/13/16 1607  BP: 145/83 116/80   Pulse: 116 80   Resp: 16 18   Temp: 97.4 F (36.3 C)    TempSrc: Oral    SpO2: 97% 99%   Weight:   81.6 kg (180 lb)   Eyes: PERRL, lids and conjunctivae normal ENMT: Mucous membranes are moist. Posterior pharynx clear of any exudate or lesions.  Neck:  supple Respiratory: clear to auscultation bilaterally, no wheezing, no crackles. Normal respiratory effort. No accessory muscle use.  Cardiovascular: Regular rate and rhythm, no murmurs / rubs / gallops. Bilateral pedal edema+ Abdomen: no tenderness, no masses palpated. No hepatosplenomegaly. Bowel sounds positive.  Musculoskeletal: Contracture and weakness of lower extremities. Skin: Left lower extremity erythematous rash extending above knee, mostly erythematous and edematous at lower part of leg.   Neurologic: Alert, awake, following commands.  Psychiatric: Normal mood. He has chronic dysarthria likely related with cerebral palsy.   Labs on Admission: I have personally reviewed following labs and imaging studies  CBC:  Recent Labs Lab 01/13/16 1452  WBC 6.2  NEUTROABS 3.8  HGB 12.3*  HCT 37.5*  MCV 81.0  PLT 123456   Basic Metabolic Panel:  Recent Labs Lab 01/13/16 1452  NA 139  K 3.7  CL 104  CO2 25  GLUCOSE 187*  BUN 14  CREATININE 0.79  CALCIUM 9.2   GFR: Estimated Creatinine Clearance: 105.4 mL/min (by C-G formula based on SCr of 0.79 mg/dL). Liver Function Tests: No results for input(s): AST, ALT, ALKPHOS, BILITOT, PROT, ALBUMIN in the last 168 hours. No results for input(s): LIPASE, AMYLASE in the last 168 hours. No results for input(s): AMMONIA  in the last 168 hours. Coagulation Profile:  Recent Labs Lab 01/13/16 1452  INR 1.07   Cardiac Enzymes: No results for input(s): CKTOTAL, CKMB, CKMBINDEX, TROPONINI in the last 168 hours. BNP (last 3 results) No results for input(s): PROBNP in the last 8760 hours. HbA1C: No results for input(s): HGBA1C in the last 72 hours. CBG: No results for input(s): GLUCAP in the last 168 hours. Lipid Profile: No results for input(s): CHOL, HDL, LDLCALC, TRIG, CHOLHDL, LDLDIRECT in the last 72 hours. Thyroid Function Tests: No results for input(s): TSH, T4TOTAL, FREET4, T3FREE, THYROIDAB in the last 72 hours. Anemia Panel: No results for input(s): VITAMINB12, FOLATE, FERRITIN, TIBC, IRON, RETICCTPCT in the last 72 hours. Urine analysis:    Component Value Date/Time   COLORURINE YELLOW 10/01/2015 1557   APPEARANCEUR CLEAR 10/01/2015 1557   LABSPEC 1.025 10/01/2015 1557   PHURINE 6.0 10/01/2015 1557   GLUCOSEU NEGATIVE 10/01/2015 1557   HGBUR NEGATIVE 10/01/2015 1557   BILIRUBINUR NEGATIVE 10/01/2015 1557   KETONESUR NEGATIVE 10/01/2015 1557   PROTEINUR NEGATIVE 10/01/2015 1557   UROBILINOGEN 1.0 05/26/2014 2211   NITRITE NEGATIVE 10/01/2015 1557   LEUKOCYTESUR NEGATIVE 10/01/2015 1557   Sepsis Labs: !!!!!!!!!!!!!!!!!!!!!!!!!!!!!!!!!!!!!!!!!!!! @LABRCNTIP (procalcitonin:4,lacticidven:4) )No results found for this or any previous visit (from the past 240 hour(s)).   Radiological Exams on Admission: No results found.   Assessment/Plan  # Nonpurulent left leg cellulitis, recurrent: -Doppler ultrasound result negative for DVT. D-dimer not elevated. I do not think this is DVT therefore I'll discontinue heparin systemic treatment. -I will continue IV clindamycin to treat cellulitis. The cultures were sent from the ER. Follow-up the results. Patient is not septic. He does not have fever and has no leukocytosis. -Unknown why patient has recurrent cellulitis. I will check HbA1c  level. -Elevated the leg especially while on bed -Continue supportive treatment.  # 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[place]): Ambulates in a wheelchair. Continue muscle relaxant and home medications. He has a caretaker at home.    #Essential hypertension: Blood pressure acceptable. Resume home medications in the hospital.  Continue current medical and supportive care. Monitor clinical response with antibiotics. If there is improvement in rash by tomorrow, we may be able to discharge home with oral antibiotics and outpatient follow-up.  DVT prophylaxis: Lovenox subcutaneous Code Status: Full code Family Communication: No family present at bedside. He has caregiver presented at bedside Disposition Plan: Likely discharge home tomorrow depending on clinical improvement Consults called: None Admission status: Observation.   Lakyla Biswas Tanna Furry MD [facility] Pager [phone]  If 7PM-7AM, please contact night-coverage [email or web address] Password [email or web address]  01/13/2016, 5:00 PM

## 2016-01-13 NOTE — ED Provider Notes (Signed)
[facility] DEPT Provider Note   CSN: [id number] Arrival date & time: 01/13/16  1347     History   Chief Complaint Chief Complaint  Patient presents with  . Leg Swelling    HPI Thomas Ramos is a 45 y.o. male.  45 yo M with a chief complaints of erythema and warmth to the left lower extremity. This is been an off and on problem for him. He has been seen in the ED at least 2 times prior. Family denies any fevers or chills. Has a history of cerebral palsy and contractures. Has had no history of blood clot before.   The history is provided by the patient.  Leg Pain   This is a recurrent problem. The current episode started less than 1 hour ago. The problem occurs constantly. The problem has not changed since onset.The pain is present in the left lower leg. The pain is at a severity of 5/10. The pain is mild. He has tried nothing for the symptoms. The treatment provided no relief. There has been no history of extremity trauma.    Past Medical History:  Diagnosis Date  . Anxiety   . Cerebral palsy ([place])   . Chronic diastolic (congestive) heart failure   . CP (cerebral palsy) ([place])   . Diabetes mellitus without complication (HCC)    borderline  . Environmental allergies    takes inhalers if needed  . Esophageal stricture   . GERD (gastroesophageal reflux disease)   . Hypertension   . Motility disorder, esophageal   . Pneumonia   . 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 S/P Botox injection    approx every 4 months  . Seasonal allergies     Patient Active Problem List   Diagnosis Date Noted  . Cellulitis 01/13/2016  . OSA (obstructive sleep apnea) 12/11/2015  . Hyponatremia 10/06/2015  . AKI (acute kidney injury) ([place]) 10/06/2015  . Leukocytosis 10/06/2015  . Nausea & vomiting 10/06/2015  . Chronic diastolic (congestive) heart failure   . Leg swelling   . Community acquired pneumonia 09/20/2015  . Depression with anxiety 09/20/2015  . Cellulitis of left leg  09/20/2015  . CAP (community acquired pneumonia) 09/20/2015  . Hypersomnia 07/16/2015  . Dyspnea 07/16/2015  . Asthma 07/16/2015  . GERD (gastroesophageal reflux disease) 07/16/2015  . Cerebral palsy ([place]) 07/16/2015  . Hematemesis with nausea   . Influenza with pneumonia   . Aspiration pneumonia ([place]) 05/24/2014  . HTN (hypertension) 05/24/2014  . Fever 05/24/2014  . Dysphagia 09/16/2010  . 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[place]) 09/16/2010    Past Surgical History:  Procedure Laterality Date  . BOTOX INJECTION N/A 12/21/2012   Procedure: BOTOX INJECTION;  Surgeon: Inda Castle, MD;  Location: [facility];  Service: Endoscopy;  Laterality: N/A;  . BOTOX INJECTION N/A 06/28/2014   Procedure: BOTOX INJECTION;  Surgeon: Inda Castle, MD;  Location: [facility];  Service: Endoscopy;  Laterality: N/A;  . ESOPHAGOGASTRODUODENOSCOPY N/A 12/21/2012   Procedure: ESOPHAGOGASTRODUODENOSCOPY (EGD);  Surgeon: Inda Castle, MD;  Location: [facility];  Service: Endoscopy;  Laterality: N/A;  . ESOPHAGOGASTRODUODENOSCOPY N/A 06/28/2014   Procedure: ESOPHAGOGASTRODUODENOSCOPY (EGD);  Surgeon: Inda Castle, MD;  Location: [facility];  Service: Endoscopy;  Laterality: N/A;  . ESOPHAGUS SURGERY     stretched esophagus  . legs    . MOUTH SURGERY    . TENDON RELEASE         Home Medications    Prior to Admission medications  Medication Sig Start Date End Date Taking? Authorizing Provider  amLODipine (NORVASC) 10 MG tablet Take 10 mg by mouth daily.   Yes Historical Provider, MD  baclofen (LIORESAL) 10 MG tablet Take 10 mg by mouth 3 (three) times daily as needed for muscle spasms. For neck stiffness/pain. 08/30/15  Yes Historical Provider, MD  cetirizine (ZYRTEC) 10 MG tablet Take 10 mg by mouth daily.  03/01/14  Yes Historical Provider, MD  citalopram (CELEXA) 20 MG tablet Take 20 mg by mouth every evening.   Yes Historical Provider, MD  CRESTOR 5 MG tablet Take 5 mg by mouth at bedtime.  04/22/14  Yes Historical Provider, MD  furosemide (LASIX) 20 MG tablet Take 20 mg by mouth daily. 10/17/15  Yes Historical Provider, MD  omeprazole (PRILOSEC) 20 MG capsule Take 1 capsule (20 mg total) by mouth daily. 08/11/15  Yes April Palumbo, MD  potassium chloride (K-DUR) 10 MEQ tablet Take 10 mEq by mouth daily. 11/11/15  Yes Historical Provider, MD  valsartan (DIOVAN) 80 MG tablet Take 80 mg by mouth daily. 08/26/15  Yes Historical Provider, MD  albuterol (PROVENTIL HFA;VENTOLIN HFA) 108 (90 Base) MCG/ACT inhaler Inhale 1-2 puffs into the lungs every 6 (six) hours as needed for wheezing or shortness of breath. 08/26/15   Ripley Fraise, MD  busPIRone (BUSPAR) 7.5 MG tablet Take 7.5 mg by mouth 2 (two) times daily as needed (for anxiousness).     Historical Provider, MD  clindamycin (CLEOCIN) 300 MG capsule Take 1 capsule (300 mg total) by mouth 3 (three) times daily. Patient not taking: Reported on 01/13/2016 11/13/15   Jola Schmidt, MD  fluticasone [facility]) 50 MCG/ACT nasal spray Place 1 spray into both nostrils daily as needed for allergies.  03/01/14   Historical Provider, MD  HYDROcodone-acetaminophen (NORCO/VICODIN) 5-325 MG tablet Take 1 tablet by mouth every 6 (six) hours as needed for moderate pain. 11/13/15   Jola Schmidt, MD  montelukast (SINGULAIR) 10 MG tablet Take 10 mg by mouth daily as needed (allergies).     Historical Provider, MD  nitroGLYCERIN (NITROSTAT) 0.4 MG SL tablet Place 0.4 mg under the tongue every 5 (five) minutes as needed. For chest pain. 08/26/15   Historical Provider, MD  ranitidine (ZANTAC) 150 MG tablet Take 150 mg by mouth 2 (two) times daily as needed. For heartburn. 08/26/15   Historical Provider, MD    Family History Family History  Problem Relation Age of Onset  . Hypertension Father   . Lung cancer Father   . Diabetes Mother     Social History Social History  Substance Use Topics  . Smoking status: Former Research scientist (life sciences)  . Smokeless tobacco: Former Systems developer      Comment: smoked one time  . Alcohol use 0.0 oz/week     Comment: 2 beers every other Friday and Saturday     Allergies   Sulfamethoxazole-trimethoprim; Metronidazole; and Penicillins   Review of Systems Review of Systems  Constitutional: Negative for chills and fever.  HENT: Negative for congestion and facial swelling.   Eyes: Negative for discharge and visual disturbance.  Respiratory: Negative for shortness of breath.   Cardiovascular: Positive for leg swelling. Negative for chest pain and palpitations.  Gastrointestinal: Negative for abdominal pain, diarrhea and vomiting.  Musculoskeletal: Negative for arthralgias and myalgias.  Skin: Negative for color change and rash.  Neurological: Negative for tremors, syncope and headaches.  Psychiatric/Behavioral: Negative for confusion and dysphoric mood.     Physical Exam Updated Vital Signs BP 116/80  Pulse 80   Temp 97.4 F (36.3 C) (Oral)   Resp 18   Wt 180 lb (81.6 kg)   SpO2 99%   BMI 35.15 kg/m   Physical Exam  Constitutional: He is oriented to person, place, and time. He appears well-developed and well-nourished.  HENT:  Head: Normocephalic and atraumatic.  Eyes: EOM are normal. Pupils are equal, round, and reactive to light.  Neck: Normal range of motion. Neck supple. No JVD present.  Cardiovascular: Regular rhythm.  Tachycardia present.  Exam reveals no gallop and no friction rub.   No murmur heard. Pulmonary/Chest: No respiratory distress. He has no wheezes.  Abdominal: He exhibits no distension and no mass. There is no tenderness. There is no rebound and no guarding.  Musculoskeletal: Normal range of motion. He exhibits edema (Left-sided with surrounding erythema warmth this goes up to his thigh.).  Neurological: He is alert and oriented to person, place, and time.  Skin: No rash noted. No pallor.  Psychiatric: He has a normal mood and affect. His behavior is normal.  Nursing note and vitals  reviewed.    ED Treatments / Results  Labs (all labs ordered are listed, but only abnormal results are displayed) Labs Reviewed  CBC WITH DIFFERENTIAL/PLATELET - Abnormal; Notable for the following:       Result Value   Hemoglobin 12.3 (*)    HCT 37.5 (*)    All other components within normal limits  BASIC METABOLIC PANEL - Abnormal; Notable for the following:    Glucose, Bld 187 (*)    All other components within normal limits  CULTURE, BLOOD (ROUTINE X 2)  CULTURE, BLOOD (ROUTINE X 2)  D-DIMER, QUANTITATIVE (NOT AT [facility])  APTT  PROTIME-INR    EKG  EKG Interpretation None       Radiology No results found.  Procedures Procedures (including critical care time)  Medications Ordered in ED Medications  clindamycin (CLEOCIN) IVPB 900 mg (900 mg Intravenous New Bag/Given 01/13/16 1608)  heparin bolus via infusion 3,000 Units (not administered)    And  heparin ADULT infusion 100 units/mL (25000 units/255mL sodium chloride 0.45%) (not administered)  sodium chloride 0.9 % bolus 1,000 mL (0 mLs Intravenous Stopped 01/13/16 1607)     Initial Impression / Assessment and Plan / ED Course  I have reviewed the triage vital signs and the nursing notes.  Pertinent labs & imaging results that were available during my care of the patient were reviewed by me and considered in my medical decision making (see chart for details).  Clinical Course    45 yo M With a chief complaints of leg swelling. This been going on for the past couple days. Having some erythema and swelling. Erythema and swelling is fairly extensive on exam. Patient also having tachycardia up into the 120s. Started on clindamycin given a bag of fluids. DVT study of the extremity was contacted and due to contractures. The vascular tech discussed the findings with me. He felt that there was no flow through some of the tibial veins. Clinically the patient's leg looks like he might have a DVT. I'll therefore start him on  heparin. He had improvement of his heart rate while in the ED.  The patients results and plan were reviewed and discussed.   Any x-rays performed were independently reviewed by myself.   Differential diagnosis were considered with the presenting HPI.  Medications  clindamycin (CLEOCIN) IVPB 900 mg (900 mg Intravenous New Bag/Given 01/13/16 1608)  heparin bolus via infusion  3,000 Units (not administered)    And  heparin ADULT infusion 100 units/mL (25000 units/215mL sodium chloride 0.45%) (not administered)  sodium chloride 0.9 % bolus 1,000 mL (0 mLs Intravenous Stopped 01/13/16 1607)    Vitals:   01/13/16 1356 01/13/16 1552 01/13/16 1607  BP: 145/83 116/80   Pulse: 116 80   Resp: 16 18   Temp: 97.4 F (36.3 C)    TempSrc: Oral    SpO2: 97% 99%   Weight:   180 lb (81.6 kg)    Final diagnoses:  Cellulitis of left lower extremity    Admission/ observation were discussed with the admitting physician, patient and/or family and they are comfortable with the plan.      Final Clinical Impressions(s) / ED Diagnoses   Final diagnoses:  Cellulitis of left lower extremity    New Prescriptions New Prescriptions   No medications on file     Deno Etienne, DO 01/13/16 1612

## 2016-01-13 NOTE — Progress Notes (Signed)
ANTICOAGULATION CONSULT NOTE - Initial Consult  Pharmacy Consult for Heparin Indication: VTE treatment  Allergies  Allergen Reactions  . Sulfamethoxazole-Trimethoprim Nausea And Vomiting  . Metronidazole Nausea And Vomiting  . Penicillins Hives and Nausea And Vomiting    Has patient had a PCN reaction causing immediate rash, facial/tongue/throat swelling, SOB or lightheadedness with hypotension: yes Has patient had a PCN reaction causing severe rash involving mucus membranes or skin necrosis: no Has patient had a PCN reaction that required hospitalization: no Has patient had a PCN reaction occurring within the last 10 years: no If all of the above answers are "NO", then may proceed with Cephalosporin use.     Patient Measurements:   Heparin Dosing Weight: 68.2kg  Vital Signs: Temp: 97.4 F (36.3 C) (10/30 1356) Temp Source: Oral (10/30 1356) BP: 116/80 (10/30 1552) Pulse Rate: 80 (10/30 1552)  Labs:  Recent Labs  01/13/16 1452  HGB 12.3*  HCT 37.5*  PLT 225  CREATININE 0.79    CrCl cannot be calculated (Unknown ideal weight.).   Medical History: Past Medical History:  Diagnosis Date  . Anxiety   . Cerebral palsy ([place])   . Chronic diastolic (congestive) heart failure   . CP (cerebral palsy) ([place])   . Diabetes mellitus without complication (HCC)    borderline  . Environmental allergies    takes inhalers if needed  . Esophageal stricture   . GERD (gastroesophageal reflux disease)   . Hypertension   . Motility disorder, esophageal   . Pneumonia   . 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[place])   . S/P Botox injection    approx every 4 months  . Seasonal allergies     Assessment: 66 yoM with hx cerebral palsy, quadriplegia, and recurrent cellulitis in LLE presents with LLE redness and swelling.  Preliminary venous duplex report shows no obvious deep or superficial vein thrombosis.  MD consulted pharmacy to start IV heparin until final report is read.    CBC shows  slightly low hgb, plts WNL.  No anticoagulants or antiplatelets noted PTA.     Goal of Therapy:  Heparin level 0.3-0.7 units/ml Monitor platelets by anticoagulation protocol: Yes   Plan:  Baseline PT/INR, aPTT Heparin bolus 3000 units IV x1, then infusion 1050 units/hr = 10.5 ml/hr F/u heparin level in 6 hours F/u final report for DVT  Ralene Bathe, PharmD, BCPS 01/13/2016, 4:10 PM  Pager: [phone]   Elonda Giuliano E 01/13/2016,3:56 PM

## 2016-01-14 DIAGNOSIS — Z833 Family history of diabetes mellitus: Secondary | ICD-10-CM | POA: Diagnosis not present

## 2016-01-14 DIAGNOSIS — I110 Hypertensive heart disease with heart failure: Secondary | ICD-10-CM | POA: Diagnosis present

## 2016-01-14 DIAGNOSIS — L03116 Cellulitis of left lower limb: Secondary | ICD-10-CM | POA: Diagnosis present

## 2016-01-14 DIAGNOSIS — E118 Type 2 diabetes mellitus with unspecified complications: Secondary | ICD-10-CM | POA: Diagnosis not present

## 2016-01-14 DIAGNOSIS — Z880 Allergy status to penicillin: Secondary | ICD-10-CM | POA: Diagnosis not present

## 2016-01-14 DIAGNOSIS — Z8249 Family history of ischemic heart disease and other diseases of the circulatory system: Secondary | ICD-10-CM | POA: Diagnosis not present

## 2016-01-14 DIAGNOSIS — Z993 Dependence on wheelchair: Secondary | ICD-10-CM | POA: Diagnosis not present

## 2016-01-14 DIAGNOSIS — E119 Type 2 diabetes mellitus without complications: Secondary | ICD-10-CM | POA: Diagnosis present

## 2016-01-14 DIAGNOSIS — I5032 Chronic diastolic (congestive) heart failure: Secondary | ICD-10-CM | POA: Diagnosis present

## 2016-01-14 DIAGNOSIS — Z882 Allergy status to sulfonamides status: Secondary | ICD-10-CM | POA: Diagnosis not present

## 2016-01-14 DIAGNOSIS — Z888 Allergy status to other drugs, medicaments and biological substances status: Secondary | ICD-10-CM | POA: Diagnosis not present

## 2016-01-14 DIAGNOSIS — G809 Cerebral palsy, unspecified: Secondary | ICD-10-CM | POA: Diagnosis not present

## 2016-01-14 DIAGNOSIS — Z801 Family history of malignant neoplasm of trachea, bronchus and lung: Secondary | ICD-10-CM | POA: Diagnosis not present

## 2016-01-14 DIAGNOSIS — G808 Other cerebral palsy: Secondary | ICD-10-CM | POA: Diagnosis present

## 2016-01-14 MED ORDER — PANTOPRAZOLE SODIUM 40 MG PO TBEC
40.0000 mg | DELAYED_RELEASE_TABLET | Freq: Every day | ORAL | Status: DC
  Administered 2016-01-15: 40 mg via ORAL
  Filled 2016-01-14: qty 1

## 2016-01-14 NOTE — Progress Notes (Signed)
Patient ID: Thomas Ramos, male   DOB: 1970/06/10, 45 y.o.   MRN: [id number]     PROGRESS NOTE    Thomas Ramos  [id number] DOB: [birth year] DOA: 01/13/2016  PCP: Chesley Noon, MD   Brief Narrative:  45 y.o. male with medical history significant of congenital cerebral palsy, wheelchair bound, chronic diastolic heart failure, hypertension, left leg cellulitis about a month ago when he was treated with antibiotics and now presented with left leg swelling associated with progressively worsening erythema, warm to touch and tenderness to touch. Patient denies recent traumas to the area.  Assessment & Plan:   Active Problems:   Cellulitis of left leg - Clinically improving, erythema and edema seemed to be up improving but still present - With the comfortable with continuing IV clindamycin today and transitioning to by mouth antibiotic in the morning - If erythema and edema continues to improve, no fevers overnight, suspect patient can go home in the morning    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HCC) - Left message for caregiver to call me back at their convenience  DVT prophylaxis: Lovenox SQ Code Status: Full  Family Communication: Patient at bedside, called caregiver from pt's cell phone per his request and left message with my contact information to call me back at their convenience  Disposition Plan: home in AM   Consultants:   None   Procedures:   None  Antimicrobials:   Clindamycin 10/30 -->  Subjective: No events overnight.   Objective: Vitals:   01/13/16 1756 01/13/16 1912 01/13/16 2045 01/14/16 0623  BP: 124/70 (!) 106/59 105/61 102/65  Pulse: 75 74 70 65  Resp: 18 20  18   Temp:  98.2 F (36.8 C) 97.8 F (36.6 C) 98.2 F (36.8 C)  TempSrc:  Oral Oral Oral  SpO2: 99% 97% 97% 98%  Weight:  75.1 kg (165 lb 9.1 oz)    Height:  5\' 2"  (1.575 m)      Intake/Output Summary (Last 24 hours) at 01/14/16 1210 Last data filed at 01/14/16 0800  Gross per 24 hour    Intake             2002 ml  Output              200 ml  Net             1802 ml   Filed Weights   01/13/16 1607 01/13/16 1912  Weight: 81.6 kg (180 lb) 75.1 kg (165 lb 9.1 oz)    Examination:  General exam: Appears calm and comfortable  Respiratory system: Respiratory effort normal. Cardiovascular system: S1 & S2 heard, RRR. No JVD, murmurs, rubs, gallops or clicks.  Gastrointestinal system: Abdomen is nondistended, soft and nontender. No organomegaly or masses felt.  Extremities: Left lower extremity edema around shin area with erythema and warmth to touch, TTP  Data Reviewed: I have personally reviewed following labs and imaging studies  CBC:  Recent Labs Lab 01/13/16 1452  WBC 6.2  NEUTROABS 3.8  HGB 12.3*  HCT 37.5*  MCV 81.0  PLT 123456   Basic Metabolic Panel:  Recent Labs Lab 01/13/16 1452  NA 139  K 3.7  CL 104  CO2 25  GLUCOSE 187*  BUN 14  CREATININE 0.79  CALCIUM 9.2   Coagulation Profile:  Recent Labs Lab 01/13/16 1452  INR 1.07   Urine analysis:    Component Value Date/Time   COLORURINE YELLOW 10/01/2015 1557   APPEARANCEUR CLEAR 10/01/2015 1557   LABSPEC 1.025  10/01/2015 1557   PHURINE 6.0 10/01/2015 1557   GLUCOSEU NEGATIVE 10/01/2015 1557   HGBUR NEGATIVE 10/01/2015 [place] 10/01/2015 1557   KETONESUR NEGATIVE 10/01/2015 1557   PROTEINUR NEGATIVE 10/01/2015 1557   UROBILINOGEN 1.0 05/26/2014 2211   NITRITE NEGATIVE 10/01/2015 1557   LEUKOCYTESUR NEGATIVE 10/01/2015 1557   Radiology Studies: No results found.  Scheduled Meds: . amLODipine  10 mg Oral Daily  . citalopram  20 mg Oral QPM  . clindamycin (CLEOCIN) IV  600 mg Intravenous Q8H  . enoxaparin (LOVENOX) injection  40 mg Subcutaneous Q24H  . irbesartan  37.5 mg Oral Daily  . loratadine  10 mg Oral Daily  . [START ON 01/15/2016] pantoprazole  40 mg Oral Daily  . potassium chloride  10 mEq Oral Daily  . rosuvastatin  5 mg Oral QHS   Continuous  Infusions: . sodium chloride 50 mL/hr at 01/13/16 1924     LOS: 0 days   Time spent: 20 minutes   Faye Ramsay, MD [facility] Pager [phone]  If 7PM-7AM, please contact night-coverage [email or web address] Password [email or web address] 01/14/2016, 12:10 PM

## 2016-01-14 NOTE — Plan of Care (Signed)
Problem: Pain Managment: Goal: General experience of comfort will improve Outcome: Progressing No complaints of pain  Problem: Skin Integrity: Goal: Risk for impaired skin integrity will decrease Outcome: Progressing Moisture associate skin breakdown to groin and sacrum.

## 2016-01-14 NOTE — Progress Notes (Signed)
Assumed care of pt @ 1530.  I agree with previous RN's assessment.  Will continue with plan of care.  

## 2016-01-15 DIAGNOSIS — E118 Type 2 diabetes mellitus with unspecified complications: Secondary | ICD-10-CM

## 2016-01-15 DIAGNOSIS — L03116 Cellulitis of left lower limb: Principal | ICD-10-CM

## 2016-01-15 DIAGNOSIS — G809 Cerebral palsy, unspecified: Secondary | ICD-10-CM

## 2016-01-15 LAB — BASIC METABOLIC PANEL
Anion gap: 7 (ref 5–15)
BUN: 12 mg/dL (ref 6–20)
CO2: 25 mmol/L (ref 22–32)
CREATININE: 0.64 mg/dL (ref 0.61–1.24)
Calcium: 8.8 mg/dL — ABNORMAL LOW (ref 8.9–10.3)
Chloride: 106 mmol/L (ref 101–111)
GFR calc Af Amer: >60 mL/min (ref >60)
GLUCOSE: 119 mg/dL — AB (ref 65–99)
POTASSIUM: 4 mmol/L (ref 3.5–5.1)
Sodium: 138 mmol/L (ref 135–145)

## 2016-01-15 LAB — CBC
HCT: 34.4 % — ABNORMAL LOW (ref 39.0–52.0)
Hemoglobin: 11.2 g/dL — ABNORMAL LOW (ref 13.0–17.0)
MCH: 26.3 pg (ref 26.0–34.0)
MCHC: 32.6 g/dL (ref 30.0–36.0)
MCV: 80.8 fL (ref 78.0–100.0)
PLATELETS: 205 10*3/uL (ref 150–400)
RBC: 4.26 MIL/uL (ref 4.22–5.81)
RDW: 13 % (ref 11.5–15.5)
WBC: 4.8 10*3/uL (ref 4.0–10.5)

## 2016-01-15 LAB — HEMOGLOBIN A1C
Hgb A1c MFr Bld: 6.5 % — ABNORMAL HIGH (ref 4.8–5.6)
Mean Plasma Glucose: 140 mg/dL

## 2016-01-15 LAB — GLUCOSE, CAPILLARY: Glucose-Capillary: 168 mg/dL — ABNORMAL HIGH (ref 65–99)

## 2016-01-15 MED ORDER — CLINDAMYCIN HCL 300 MG PO CAPS: 450.0000 mg | ORAL_CAPSULE | Freq: Four times a day (QID) | ORAL | Status: DC

## 2016-01-15 MED ORDER — CLINDAMYCIN HCL 300 MG PO CAPS
300.0000 mg | ORAL_CAPSULE | Freq: Four times a day (QID) | ORAL | Status: DC
  Administered 2016-01-15: 300 mg via ORAL
  Filled 2016-01-15: qty 1

## 2016-01-15 MED ORDER — CLINDAMYCIN HCL 300 MG PO CAPS
450.0000 mg | ORAL_CAPSULE | Freq: Four times a day (QID) | ORAL | Status: DC
  Filled 2016-01-15: qty 1

## 2016-01-15 MED ORDER — CLINDAMYCIN HCL 300 MG PO CAPS: 300.0000 mg | ORAL_CAPSULE | Freq: Four times a day (QID) | ORAL | 0 refills | Status: AC

## 2016-01-15 NOTE — Progress Notes (Signed)
Dawn, RN from [facility] called.  Pt is active with [facility]. Resumption of care orders for HHRN/PT.

## 2016-01-15 NOTE — Discharge Summary (Addendum)
Physician Discharge Summary  Thomas Ramos [id number] DOB: [birth year] DOA: 01/13/2016  PCP: Chesley Noon, MD  Admit date: 01/13/2016 Discharge date: 01/15/2016  Admitted From: Home Disposition:  Home  Recommendations for Outpatient Follow-up:  1. Follow up with PCP within 1 week 2. Finish antibiotics as instructed 3. Monitor for signs of worsening infection 4. Discuss new diagnosis of diabetes with PCP and discuss treatment options   Home Health:No Equipment/Devices: None   Discharge Condition:Stable CODE STATUS: Full code Diet recommendation: Heart Healthy  Brief/Interim Summary: 45 y.o.malewith medical history significant of congenital cerebral palsy, wheelchair bound, chronic diastolic heart failure, hypertension, left leg cellulitis about a month ago when he was treated with antibiotics and now presented with left leg swelling associated with progressively worsening erythema, warm to touch and tenderness to touch. Patient denies recent traumas to the area.  U/S of lower extremity negative for DVT.  Patient improved on clindamycin and was discharged home with PO clindamycin and instructions to follow up with PCP within the next week.  Patient HgA1c was elevated at admission and patient would like to discuss medication initiation for diabetes with his PCP.  Discharge Diagnoses:  Active Problems:   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HCC)   Cellulitis of left leg   Cellulitis   Left leg cellulitis   Cellulitis of left leg - Clinically improving, erythema and edema seemed to be up improving but still present - Clindamycin transitioned to PO this am - will continue for 2 additional days for a total of 7 days    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HCC) - stable - caregiver notified of patients discharge today  Diabetes Mellitus Type II (newly diagnosed) - patient to discuss initiation of medication with PCP  Discharge Instructions  Discharge Instructions    Activity as tolerated  - No restrictions    Complete by:  As directed    Call MD for:  difficulty breathing, headache or visual disturbances    Complete by:  As directed    Call MD for:  extreme fatigue    Complete by:  As directed    Call MD for:  persistant dizziness or light-headedness    Complete by:  As directed    Call MD for:  persistant nausea and vomiting    Complete by:  As directed    Call MD for:  redness, tenderness, or signs of infection (pain, swelling, redness, odor or green/yellow discharge around incision site)    Complete by:  As directed    Call MD for:  severe uncontrolled pain    Complete by:  As directed    Call MD for:  temperature >100.4    Complete by:  As directed    Diet - low sodium heart healthy    Complete by:  As directed        Medication List    TAKE these medications   albuterol 108 (90 Base) MCG/ACT inhaler Commonly known as:  PROVENTIL HFA;VENTOLIN HFA Inhale 1-2 puffs into the lungs every 6 (six) hours as needed for wheezing or shortness of breath.   amLODipine 10 MG tablet Commonly known as:  NORVASC Take 10 mg by mouth daily.   baclofen 10 MG tablet Commonly known as:  LIORESAL Take 10 mg by mouth 3 (three) times daily as needed for muscle spasms. For neck stiffness/pain.   busPIRone 7.5 MG tablet Commonly known as:  BUSPAR Take 7.5 mg by mouth 2 (two) times daily as needed (for anxiousness).   cetirizine 10 MG tablet  Commonly known as:  ZYRTEC Take 10 mg by mouth daily.   citalopram 20 MG tablet Commonly known as:  CELEXA Take 20 mg by mouth every evening.   clindamycin 300 MG capsule Commonly known as:  CLEOCIN Take 1 capsule (300 mg total) by mouth every 6 (six) hours. What changed:  when to take this   CRESTOR 5 MG tablet Generic drug:  rosuvastatin Take 5 mg by mouth at bedtime.   fluticasone 50 MCG/ACT nasal spray Commonly known as:  FLONASE Place 1 spray into both nostrils daily as needed for allergies.   furosemide 20 MG  tablet Commonly known as:  LASIX Take 20 mg by mouth daily.   HYDROcodone-acetaminophen 5-325 MG tablet Commonly known as:  NORCO/VICODIN Take 1 tablet by mouth every 6 (six) hours as needed for moderate pain.   montelukast 10 MG tablet Commonly known as:  SINGULAIR Take 10 mg by mouth daily as needed (allergies).   nitroGLYCERIN 0.4 MG SL tablet Commonly known as:  NITROSTAT Place 0.4 mg under the tongue every 5 (five) minutes as needed. For chest pain.   omeprazole 20 MG capsule Commonly known as:  PRILOSEC Take 1 capsule (20 mg total) by mouth daily.   potassium chloride 10 MEQ tablet Commonly known as:  K-DUR Take 10 mEq by mouth daily.   ranitidine 150 MG tablet Commonly known as:  ZANTAC Take 150 mg by mouth 2 (two) times daily as needed. For heartburn.   valsartan 80 MG tablet Commonly known as:  DIOVAN Take 80 mg by mouth daily.       Allergies  Allergen Reactions  . Sulfamethoxazole-Trimethoprim Nausea And Vomiting  . Metronidazole Nausea And Vomiting  . Penicillins Hives and Nausea And Vomiting    Has patient had a PCN reaction causing immediate rash, facial/tongue/throat swelling, SOB or lightheadedness with hypotension: yes Has patient had a PCN reaction causing severe rash involving mucus membranes or skin necrosis: no Has patient had a PCN reaction that required hospitalization: no Has patient had a PCN reaction occurring within the last 10 years: no If all of the above answers are "NO", then may proceed with Cephalosporin use.     Consultations:  None   Procedures/Studies:  No results found.     Subjective: Patient laying in bed asking going to home today.  Says he feels well.  Per nursing report patient tolerating diet, no fevers, and vital signs WNL.  Transitioned to PO Clindamycin this am.  Discharge Exam: Vitals:   01/15/16 0547 01/15/16 0955  BP: (!) 111/93 105/81  Pulse: 76 65  Resp: 18   Temp: 97.8 F (36.6 C)    Vitals:    01/14/16 1300 01/14/16 2045 01/15/16 0547 01/15/16 0955  BP: 110/60 (!) 114/56 (!) 111/93 105/81  Pulse: 69 74 76 65  Resp: 18 18 18    Temp: 98 F (36.7 C) 98.4 F (36.9 C) 97.8 F (36.6 C)   TempSrc: Oral Oral Oral   SpO2: 99% 97% 97% 98%  Weight:      Height:        General: Pt is alert, awake, not in acute distress Cardiovascular: RRR, S1/S2 +, no rubs, no gallops Respiratory: CTA bilaterally, no wheezing, no rhonchi Abdominal: Soft, NT, ND, bowel sounds + Extremities: no edema, no cyanosis, contractures of upper and lower extremities. Erythema on left lower extremity warm to touch and minimally red, no tenderness appreciated    The results of significant diagnostics from this hospitalization (including imaging, microbiology,  ancillary and laboratory) are listed below for reference.     Microbiology: Recent Results (from the past 240 hour(s))  Blood culture (routine x 2)     Status: None (Preliminary result)   Collection Time: 01/13/16  2:52 PM  Result Value Ref Range Status   Specimen Description LEFT ANTECUBITAL  Final   Special Requests BOTTLES DRAWN AEROBIC AND ANAEROBIC 5ML  Final   Culture   Final    NO GROWTH < 24 HOURS Performed at [facility]    Report Status PENDING  Incomplete  Blood culture (routine x 2)     Status: None (Preliminary result)   Collection Time: 01/13/16  3:53 PM  Result Value Ref Range Status   Specimen Description BLOOD LEFT HAND  Final   Special Requests IN PEDIATRIC BOTTLE 4CC  Final   Culture   Final    NO GROWTH < 24 HOURS Performed at [facility]    Report Status PENDING  Incomplete     Labs: BNP (last 3 results)  Recent Labs  06/30/15 2322 07/08/15 0023 10/01/15 2054  BNP 12.3 9.2 [id number]   Basic Metabolic Panel:  Recent Labs Lab 01/13/16 1452 01/15/16 0346  NA 139 138  K 3.7 4.0  CL 104 106  CO2 25 25  GLUCOSE 187* 119*  BUN 14 12  CREATININE 0.79 0.64  CALCIUM 9.2 8.8*   Liver Function  Tests: No results for input(s): AST, ALT, ALKPHOS, BILITOT, PROT, ALBUMIN in the last 168 hours. No results for input(s): LIPASE, AMYLASE in the last 168 hours. No results for input(s): AMMONIA in the last 168 hours. CBC:  Recent Labs Lab 01/13/16 1452 01/15/16 0346  WBC 6.2 4.8  NEUTROABS 3.8  --   HGB 12.3* 11.2*  HCT 37.5* 34.4*  MCV 81.0 80.8  PLT 225 205   Cardiac Enzymes: No results for input(s): CKTOTAL, CKMB, CKMBINDEX, TROPONINI in the last 168 hours. BNP: Invalid input(s): POCBNP CBG:  Recent Labs Lab 01/15/16 0809  GLUCAP 168*   D-Dimer  Recent Labs  01/13/16 1452  DDIMER 0.40   Hgb A1c  Recent Labs  01/13/16 1452  HGBA1C 6.5*   Lipid Profile No results for input(s): CHOL, HDL, LDLCALC, TRIG, CHOLHDL, LDLDIRECT in the last 72 hours. Thyroid function studies No results for input(s): TSH, T4TOTAL, T3FREE, THYROIDAB in the last 72 hours.  Invalid input(s): FREET3 Anemia work up No results for input(s): VITAMINB12, FOLATE, FERRITIN, TIBC, IRON, RETICCTPCT in the last 72 hours. Urinalysis    Component Value Date/Time   COLORURINE YELLOW 10/01/2015 1557   APPEARANCEUR CLEAR 10/01/2015 1557   LABSPEC 1.025 10/01/2015 1557   PHURINE 6.0 10/01/2015 1557   GLUCOSEU NEGATIVE 10/01/2015 1557   HGBUR NEGATIVE 10/01/2015 1557   BILIRUBINUR NEGATIVE 10/01/2015 1557   KETONESUR NEGATIVE 10/01/2015 1557   PROTEINUR NEGATIVE 10/01/2015 1557   UROBILINOGEN 1.0 05/26/2014 2211   NITRITE NEGATIVE 10/01/2015 1557   LEUKOCYTESUR NEGATIVE 10/01/2015 1557   Sepsis Labs Invalid input(s): PROCALCITONIN,  WBC,  LACTICIDVEN Microbiology Recent Results (from the past 240 hour(s))  Blood culture (routine x 2)     Status: None (Preliminary result)   Collection Time: 01/13/16  2:52 PM  Result Value Ref Range Status   Specimen Description LEFT ANTECUBITAL  Final   Special Requests BOTTLES DRAWN AEROBIC AND ANAEROBIC 5ML  Final   Culture   Final    NO GROWTH < 24  HOURS Performed at [facility]    Report Status PENDING  Incomplete  Blood culture (routine x 2)     Status: None (Preliminary result)   Collection Time: 01/13/16  3:53 PM  Result Value Ref Range Status   Specimen Description BLOOD LEFT HAND  Final   Special Requests IN PEDIATRIC BOTTLE 4CC  Final   Culture   Final    NO GROWTH < 24 HOURS Performed at [facility]    Report Status PENDING  Incomplete     Time coordinating discharge: Less than 30 minutes  SIGNED:   Newman Pies, MD  [facility] 01/15/2016, 2:05 PM Pager [phone] If 7PM-7AM, please contact night-coverage [email or web address] Password [email or web address]

## 2016-01-18 LAB — CULTURE, BLOOD (ROUTINE X 2)
CULTURE: NO GROWTH
CULTURE: NO GROWTH

## 2016-01-31 ENCOUNTER — Encounter (HOSPITAL_COMMUNITY): Payer: Self-pay | Admitting: *Deleted

## 2016-01-31 ENCOUNTER — Emergency Department (HOSPITAL_COMMUNITY)
Admission: EM | Admit: 2016-01-31 | Discharge: 2016-02-01 | Disposition: A | Discharge status: Home / Self Care | Payer: Medicare Other | Attending: Emergency Medicine | Admitting: Emergency Medicine

## 2016-01-31 ENCOUNTER — Ambulatory Visit: Payer: Medicare Other | Admitting: Pulmonary Disease

## 2016-01-31 ENCOUNTER — Encounter: Payer: Self-pay | Admitting: Pulmonary Disease

## 2016-01-31 DIAGNOSIS — I5032 Chronic diastolic (congestive) heart failure: Secondary | ICD-10-CM | POA: Insufficient documentation

## 2016-01-31 DIAGNOSIS — Z79899 Other long term (current) drug therapy: Secondary | ICD-10-CM | POA: Insufficient documentation

## 2016-01-31 DIAGNOSIS — I110 Hypertensive heart disease with heart failure: Secondary | ICD-10-CM | POA: Insufficient documentation

## 2016-01-31 DIAGNOSIS — Z87891 Personal history of nicotine dependence: Secondary | ICD-10-CM | POA: Diagnosis not present

## 2016-01-31 DIAGNOSIS — Z7984 Long term (current) use of oral hypoglycemic drugs: Secondary | ICD-10-CM | POA: Insufficient documentation

## 2016-01-31 DIAGNOSIS — L03116 Cellulitis of left lower limb: Secondary | ICD-10-CM

## 2016-01-31 MED ORDER — CLINDAMYCIN HCL 300 MG PO CAPS: 300.0000 mg | ORAL_CAPSULE | Freq: Four times a day (QID) | ORAL | 0 refills | Status: DC

## 2016-01-31 MED ORDER — MUPIROCIN CALCIUM 2 % EX CREA: 1.0000 "application " | TOPICAL_CREAM | Freq: Two times a day (BID) | CUTANEOUS | 0 refills | Status: DC

## 2016-01-31 NOTE — ED Notes (Signed)
Bed: WA01 Expected date:  Expected time:  Means of arrival:  Comments: 45 yo M cellulitis left lower extremity

## 2016-01-31 NOTE — ED Provider Notes (Signed)
[facility] DEPT Provider Note   CSN: [id number] Arrival date & time: 01/31/16  2225  By signing my name below, I, Maud Deed. Royston Sinner, attest that this documentation has been prepared under the direction and in the presence of Ileanna Gemmill, MD.  Electronically Signed: Maud Deed. Royston Sinner, ED Scribe. 01/31/16. 11:32 PM.   History   Chief Complaint Chief Complaint  Patient presents with  . Cellulitis   The history is provided by the patient. No language interpreter was used.  Rash   This is a new problem. The current episode started more than 1 week ago. The problem has been gradually improving. The problem is associated with nothing. There has been no fever. The rash is present on the left lower leg. The pain is mild. The pain has been constant since onset. Pertinent negatives include no weeping. Treatments tried: clinda tid. The treatment provided mild relief. Risk factors: diabetes     HPI Comments: Thomas Ramos, brought on by EMS is a 45 y.o. male with a PMHx of CP, CHF, HTN, and quadriplegic spinal paralysis who presents to the Emergency Department here for ongoing, unchanged cellulitis to the L calf this evening. Pt was initially started on Clindamycin 1 week ago written for 3 times a day. Last dose taken at 9:00 PM this evening. However, pt states redness and discomfort to leg is not improving. Pt was evaluated by his PCP yesterday for same. At that time, pt was advised to come to Emergency Department for further concerns. No aggravating or alleviating factors reported at this time. No additional OTC medications attempted prior to arrival. No recent fever, chills, nausea, or vomiting.   PCP: Chesley Noon, MD    Past Medical History:  Diagnosis Date  . Anxiety   . Cerebral palsy ([place])   . Chronic diastolic (congestive) heart failure   . CP (cerebral palsy) ([place])   . Diabetes mellitus without complication (HCC)    borderline  . Environmental allergies    takes inhalers if  needed  . Esophageal stricture   . GERD (gastroesophageal reflux disease)   . Hypertension   . Motility disorder, esophageal   . Pneumonia   . 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 S/P Botox injection    approx every 4 months  . Seasonal allergies     Patient Active Problem List   Diagnosis Date Noted  . Cellulitis 01/13/2016  . Left leg cellulitis 01/13/2016  . Essential hypertension   . OSA (obstructive sleep apnea) 12/11/2015  . Hyponatremia 10/06/2015  . AKI (acute kidney injury) ([place]) 10/06/2015  . Leukocytosis 10/06/2015  . Nausea & vomiting 10/06/2015  . Chronic diastolic (congestive) heart failure   . Leg swelling   . Community acquired pneumonia 09/20/2015  . Depression with anxiety 09/20/2015  . Cellulitis of left leg 09/20/2015  . CAP (community acquired pneumonia) 09/20/2015  . Hypersomnia 07/16/2015  . Dyspnea 07/16/2015  . Asthma 07/16/2015  . GERD (gastroesophageal reflux disease) 07/16/2015  . Cerebral palsy ([place]) 07/16/2015  . Hematemesis with nausea   . Influenza with pneumonia   . Aspiration pneumonia ([place]) 05/24/2014  . HTN (hypertension) 05/24/2014  . Fever 05/24/2014  . Dysphagia 09/16/2010  . 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 BOTOX INJECTION N/A 12/21/2012   Procedure: BOTOX INJECTION;  Surgeon: Inda Castle, MD;  Location: [facility];  Service: Endoscopy;  Laterality: N/A;  . BOTOX INJECTION N/A 06/28/2014   Procedure:  BOTOX INJECTION;  Surgeon: Inda Castle, MD;  Location: [facility];  Service: Endoscopy;  Laterality: N/A;  . ESOPHAGOGASTRODUODENOSCOPY N/A 12/21/2012   Procedure: ESOPHAGOGASTRODUODENOSCOPY (EGD);  Surgeon: Inda Castle, MD;  Location: [facility];  Service: Endoscopy;  Laterality: N/A;  . ESOPHAGOGASTRODUODENOSCOPY N/A 06/28/2014   Procedure: ESOPHAGOGASTRODUODENOSCOPY (EGD);  Surgeon: Inda Castle, MD;  Location: [facility];  Service:  Endoscopy;  Laterality: N/A;  . ESOPHAGUS SURGERY     stretched esophagus  . legs    . MOUTH SURGERY    . TENDON RELEASE         Home Medications    Prior to Admission medications   Medication Sig Start Date End Date Taking? Authorizing Provider  albuterol (PROVENTIL HFA;VENTOLIN HFA) 108 (90 Base) MCG/ACT inhaler Inhale 1-2 puffs into the lungs every 6 (six) hours as needed for wheezing or shortness of breath. 08/26/15  Yes Ripley Fraise, MD  amLODipine (NORVASC) 10 MG tablet Take 10 mg by mouth daily.   Yes Historical Provider, MD  baclofen (LIORESAL) 10 MG tablet Take 10 mg by mouth 3 (three) times daily as needed for muscle spasms. For neck stiffness/pain. 08/30/15  Yes Historical Provider, MD  busPIRone (BUSPAR) 7.5 MG tablet Take 7.5 mg by mouth 2 (two) times daily as needed (for anxiousness).    Yes Historical Provider, MD  cetirizine (ZYRTEC) 10 MG tablet Take 10 mg by mouth daily.  03/01/14  Yes Historical Provider, MD  citalopram (CELEXA) 10 MG tablet Take 10 mg by mouth daily. 01/24/16  Yes Historical Provider, MD  clindamycin (CLEOCIN) 300 MG capsule Take 300 mg by mouth 3 (three) times daily. 01/24/16  Yes Historical Provider, MD  CRESTOR 5 MG tablet Take 5 mg by mouth at bedtime. 04/22/14  Yes Historical Provider, MD  fluticasone (FLONASE) 50 MCG/ACT nasal spray Place 1 spray into both nostrils daily as needed for allergies.  03/01/14  Yes Historical Provider, MD  metFORMIN (GLUCOPHAGE-XR) 500 MG 24 hr tablet Take 500 mg by mouth daily with breakfast. 01/30/16 01/29/17 Yes Historical Provider, MD  montelukast (SINGULAIR) 10 MG tablet Take 10 mg by mouth daily as needed (allergies).    Yes Historical Provider, MD  nitroGLYCERIN (NITROSTAT) 0.4 MG SL tablet Place 0.4 mg under the tongue every 5 (five) minutes as needed. For chest pain. 08/26/15  Yes Historical Provider, MD  omeprazole (PRILOSEC) 20 MG capsule Take 1 capsule (20 mg total) by mouth daily. 08/11/15  Yes Hargis Vandyne,  MD  ranitidine (ZANTAC) 150 MG tablet Take 150 mg by mouth 2 (two) times daily as needed for heartburn.  08/26/15  Yes Historical Provider, MD  valsartan (DIOVAN) 80 MG tablet Take 80 mg by mouth daily. 08/26/15  Yes Historical Provider, MD  HYDROcodone-acetaminophen (NORCO/VICODIN) 5-325 MG tablet Take 1 tablet by mouth every 6 (six) hours as needed for moderate pain. Patient not taking: Reported on 01/31/2016 11/13/15   Jola Schmidt, MD    Family History Family History  Problem Relation Age of Onset  . Hypertension Father   . Lung cancer Father   . Diabetes Mother     Social History Social History  Substance Use Topics  . Smoking status: Former Research scientist (life sciences)  . Smokeless tobacco: Former Systems developer     Comment: smoked one time  . Alcohol use 0.0 oz/week     Comment: 2 beers every other Friday and Saturday     Allergies   Sulfamethoxazole-trimethoprim; Metronidazole; and Penicillins   Review of Systems Review of Systems  Constitutional: Negative for chills and fever.  Respiratory: Negative for cough and stridor.   Cardiovascular: Negative for chest pain.  Gastrointestinal: Negative for blood in stool, nausea and vomiting.  Skin: Positive for color change and rash.  All other systems reviewed and are negative.    Physical Exam Updated Vital Signs BP 150/72   Pulse 96   Temp 98.3 F (36.8 C) (Oral)   Resp 16   SpO2 98%   Physical Exam  Constitutional: He is oriented to person, place, and time. He appears well-developed and well-nourished. No distress.  HENT:  Head: Normocephalic and atraumatic.  Mouth/Throat: Oropharynx is clear and moist. No oropharyngeal exudate.  Eyes: Conjunctivae are normal. Pupils are equal, round, and reactive to light.  Neck: Normal range of motion. Neck supple. No JVD present.  Trachea is midline. No carotid bruits.  Cardiovascular: Normal rate, regular rhythm, normal heart sounds and intact distal pulses.   Pulmonary/Chest: Effort normal and breath  sounds normal. No stridor. No respiratory distress. He has no wheezes. He has no rales.  Abdominal: Soft. Bowel sounds are normal. He exhibits no mass. There is no tenderness. There is no guarding.  Musculoskeletal: He exhibits no edema or tenderness.  3 in x 2.75 in area on L distal shin with mild warmth. No fluctuance. Multiple cherry angiomas. Scattered macular eruption noted.  Neurological: He is alert and oriented to person, place, and time. He displays normal reflexes.  Intact DTRs.  Skin: Skin is warm and dry. Capillary refill takes less than 2 seconds. He is not diaphoretic.  Psychiatric: He has a normal mood and affect. His behavior is normal. Judgment and thought content normal.  Nursing note and vitals reviewed.    ED Treatments / Results   Vitals:   01/31/16 2235 01/31/16 2338  BP: 150/72 122/75  Pulse: 96 79  Resp: 16 19  Temp: 98.3 F (36.8 C) 97.5 F (36.4 C)    DIAGNOSTIC STUDIES: Oxygen Saturation is 98% on RA, Normal by my interpretation.    COORDINATION OF CARE: 11:29 PM-Discussed treatment plan with pt at bedside and pt agreed to plan.     11:32 PM- Per care everywhere, previous WBC on 11/16 was 5.8 with a hemoglobin of 12.3 without any hemolysis. Normal platelet count noted at that time. Cellulitis area is the same in size this evening if not smaller than initial evaluation.    Procedures Procedures (including critical care time)    Final Clinical Impressions(s) / ED Diagnoses  Cellulitis: improving on subtherapeutic dose of clindamycin.  Will increase clinda to QID and add mupiricin topically.  Warm compresses and strict return precautions.  All questions answered to patient's satisfaction. Based on history and exam patient has been appropriately medically screened and emergency conditions excluded. Patient is stable for discharge at this time. Follow up with your PMD for recheck in 2 days and strict return precautions given.  New Prescriptions   I  personally performed the services described in this documentation, which was scribed in my presence. The recorded information has been reviewed and is accurate.      Veatrice Kells, MD 02/01/16 [phone]

## 2016-01-31 NOTE — ED Notes (Signed)
Pt reports he was dx with LLE cellulitis, is presently taking doxycycline.  States he was advised to come to the ED if he thinks his leg is getting worse.  He cannot tell if the swelling is getting worse or not.  LLE slightly warm to touch.  Swelling in bila LE noted.  Pedal pulses noted per doppler.  Pt lives at home with 24-hour care givers.

## 2016-01-31 NOTE — ED Triage Notes (Signed)
Pt has cellulitis to his left calf and is on clindamycin for this.  Pt was seen yesterday and his PCP PA told him to go to the ED if he has concerns.  Pt VSS for ems.  146/94, HR90, RR 18, 96%RA.  98.62F temp. Pt lives at home with 24hour care.

## 2016-02-01 NOTE — ED Notes (Signed)
[facility] called for transport.  

## 2016-02-11 ENCOUNTER — Ambulatory Visit: Payer: Medicare Other | Admitting: Pulmonary Disease

## 2016-02-20 ENCOUNTER — Ambulatory Visit (INDEPENDENT_AMBULATORY_CARE_PROVIDER_SITE_OTHER): Payer: Medicare Other | Admitting: Internal Medicine

## 2016-02-20 DIAGNOSIS — B351 Tinea unguium: Secondary | ICD-10-CM | POA: Diagnosis not present

## 2016-02-20 MED ORDER — TERBINAFINE HCL 250 MG PO TABS: 250.0000 mg | ORAL_TABLET | Freq: Every day | ORAL | 2 refills | Status: DC

## 2016-02-20 NOTE — Progress Notes (Signed)
[facility] for Infectious Disease      Reason for Consult: recurrent cellulitis    Referring Physician: Roe Coombs PA    Patient ID: Thomas Ramos, male    DOB: 21-May-1970, 45 y.o.   MRN: [id number]  HPI:   He comes in for evaluation for recurrent cellulitis.  History obtained from the patient, Care Everywhere.  He reportedly has had multiple issues of redness, swelling and warmth of left leg.  No reported fever or chills.  States that when this comes up he goes to the ED and told it is cellulitis, gets antibiotics for 10-14 days and it gets better.  No picture available.  States it is a lot of swelling, red, no real tenderness.  Vascular study did not find DVT.   Previous record reviewed Care Everywhere.  No pictures, multiple antibiotic courses.    Past Medical History:  Diagnosis Date  . Anxiety   . Cerebral palsy ([place])   . Chronic diastolic (congestive) heart failure   . CP (cerebral palsy) ([place])   . Diabetes mellitus without complication (HCC)    borderline  . Environmental allergies    takes inhalers if needed  . Esophageal stricture   . GERD (gastroesophageal reflux disease)   . Hypertension   . Motility disorder, esophageal   . Pneumonia   . 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 S/P Botox injection    approx every 4 months  . Seasonal allergies     Prior to Admission medications   Medication Sig Start Date End Date Taking? Authorizing Provider  albuterol (PROVENTIL HFA;VENTOLIN HFA) 108 (90 Base) MCG/ACT inhaler Inhale 1-2 puffs into the lungs every 6 (six) hours as needed for wheezing or shortness of breath. 08/26/15   Ripley Fraise, MD  amLODipine (NORVASC) 10 MG tablet Take 10 mg by mouth daily.    Historical Provider, MD  baclofen (LIORESAL) 10 MG tablet Take 10 mg by mouth 3 (three) times daily as needed for muscle spasms. For neck stiffness/pain. 08/30/15   Historical Provider, MD  busPIRone (BUSPAR) 7.5 MG tablet Take 7.5 mg by mouth 2 (two) times  daily as needed (for anxiousness).     Historical Provider, MD  cetirizine (ZYRTEC) 10 MG tablet Take 10 mg by mouth daily.  03/01/14   Historical Provider, MD  clindamycin (CLEOCIN) 300 MG capsule Take 1 capsule (300 mg total) by mouth 4 (four) times daily. X 7 days 01/31/16   April Palumbo, MD  CRESTOR 5 MG tablet Take 5 mg by mouth at bedtime. 04/22/14   Historical Provider, MD  fluticasone (FLONASE) 50 MCG/ACT nasal spray Place 1 spray into both nostrils daily as needed for allergies.  03/01/14   Historical Provider, MD  HYDROcodone-acetaminophen (NORCO/VICODIN) 5-325 MG tablet Take 1 tablet by mouth every 6 (six) hours as needed for moderate pain. Patient not taking: Reported on 01/31/2016 11/13/15   Jola Schmidt, MD  metFORMIN (GLUCOPHAGE-XR) 500 MG 24 hr tablet Take 500 mg by mouth daily with breakfast. 01/30/16 01/29/17  Historical Provider, MD  montelukast (SINGULAIR) 10 MG tablet Take 10 mg by mouth daily as needed (allergies).     Historical Provider, MD  mupirocin cream (BACTROBAN) 2 % Apply 1 application topically 2 (two) times daily. 01/31/16   April Palumbo, MD  nitroGLYCERIN (NITROSTAT) 0.4 MG SL tablet Place 0.4 mg under the tongue every 5 (five) minutes as needed. For chest pain. 08/26/15   Historical Provider, MD  omeprazole (PRILOSEC) 20 MG capsule Take  1 capsule (20 mg total) by mouth daily. 08/11/15   April Palumbo, MD  ranitidine (ZANTAC) 150 MG tablet Take 150 mg by mouth 2 (two) times daily as needed for heartburn.  08/26/15   Historical Provider, MD  valsartan (DIOVAN) 80 MG tablet Take 80 mg by mouth daily. 08/26/15   Historical Provider, MD    Allergies  Allergen Reactions  . Sulfamethoxazole-Trimethoprim Nausea And Vomiting  . Metronidazole Nausea And Vomiting  . Penicillins Hives and Nausea And Vomiting    Has patient had a PCN reaction causing immediate rash, facial/tongue/throat swelling, SOB or lightheadedness with hypotension: yes Has patient had a PCN reaction  causing severe rash involving mucus membranes or skin necrosis: no Has patient had a PCN reaction that required hospitalization: no Has patient had a PCN reaction occurring within the last 10 years: no If all of the above answers are "NO", then may proceed with Cephalosporin use.     Social History  Substance Use Topics  . Smoking status: Former Research scientist (life sciences)  . Smokeless tobacco: Former Systems developer     Comment: smoked one time  . Alcohol use 0.0 oz/week     Comment: 2 beers every other Friday and Saturday    Family History  Problem Relation Age of Onset  . Hypertension Father   . Lung cancer Father   . Diabetes Mother     Review of Systems  Constitutional: negative for fevers, chills and sweats Integument/breast: negative for rash Musculoskeletal: negative for myalgias and arthralgias All other systems reviewed and are negative    Constitutional: in no apparent distress  Vitals:   02/20/16 0941  BP: 125/78  Pulse: 77  Temp: 98.1 F (36.7 C)   EYES: anicteric ENMT: no thrush Cardiovascular: Cor RRR Respiratory: clear Musculoskeletal: left leg with chronic vasular changes, no warmth, redness, has some edema in foot and lower leg Left foot with great toe onychomycosis Skin: negatives: no rash Neuro: contracted arms, legs  Labs: Lab Results  Component Value Date   WBC 4.8 01/15/2016   HGB 11.2 (L) 01/15/2016   HCT 34.4 (L) 01/15/2016   MCV 80.8 01/15/2016   PLT 205 01/15/2016    Lab Results  Component Value Date   CREATININE 0.64 01/15/2016   BUN 12 01/15/2016   NA 138 01/15/2016   K 4.0 01/15/2016   CL 106 01/15/2016   CO2 25 01/15/2016    Lab Results  Component Value Date   ALT 20 06/03/2015   AST 22 06/03/2015   ALKPHOS 105 06/03/2015   BILITOT 0.2 (L) 06/03/2015   INR 1.07 01/13/2016     Assessment: recurrent cellulitis vs chronic vascular changes.  I discussed the differential with the patient and I am unable to determine at this time if he has had true  recurrent cellulitis vs swelling and vascular changes related to vascular insufficiency.  By history, I most suspect this is non infectious swelling.    Plan: 1) elevation of leg when possible 2) with an outbreak, consider elevating leg above heart and see if it resolves without antibiotics (also discussed if fever, chills, to consider it an infection) 3) I will treat the onychomycosis with terbinafine as that can be a nidus for infection 4) LFTs in 2 weeks on medicaiton - interaction reviewed with PharmD and none identified rtc 4 weeks

## 2016-02-20 NOTE — Patient Instructions (Signed)
Try terbinafine and elevate leg as much as possible.   With an outbreak, try elevating the leg

## 2016-03-02 ENCOUNTER — Other Ambulatory Visit: Payer: Medicare Other

## 2016-03-02 DIAGNOSIS — B351 Tinea unguium: Secondary | ICD-10-CM

## 2016-03-03 LAB — HEPATIC FUNCTION PANEL
ALBUMIN: 4.4 g/dL (ref 3.6–5.1)
ALT: 12 U/L (ref 9–46)
AST: 12 U/L (ref 10–40)
Alkaline Phosphatase: 96 U/L (ref 40–115)
BILIRUBIN DIRECT: 0.1 mg/dL (ref <=0.2)
BILIRUBIN TOTAL: 0.3 mg/dL (ref 0.2–1.2)
Indirect Bilirubin: 0.2 mg/dL (ref 0.2–1.2)
Total Protein: 7.4 g/dL (ref 6.1–8.1)

## 2016-03-12 ENCOUNTER — Telehealth: Payer: Self-pay | Admitting: *Deleted

## 2016-03-12 NOTE — Telephone Encounter (Signed)
Patient called for results, given.  Pt wanted Dr Linus Salmons to know that his toenail fell off and his leg has been doing well. Patietn to follow up 1/4 with Dr Linus Salmons. Landis Gandy, RN

## 2016-03-17 ENCOUNTER — Telehealth: Payer: Self-pay | Admitting: *Deleted

## 2016-03-17 NOTE — Telephone Encounter (Signed)
Really no good replacement.  He can continue and use lomotil or stop.  You can call lomotil 1 tab prn 4 times a day as needed.

## 2016-03-17 NOTE — Telephone Encounter (Signed)
Patient experiencing diarrhea with the terbinafine, states it is at times uncontrollable. Please advise if this medication can be changed to something else or if he should stop the medication. Landis Gandy, RN

## 2016-03-18 NOTE — Telephone Encounter (Signed)
Relayed info to patient.  He verbalized understanding, wants to continue the terbinafine. RN stated that Dr Linus Salmons advised he could take lomotil, patient asked if he could try over-the-counter imodium first. Patient would like to try imodium, will call back if he needs the lomotil.

## 2016-03-19 ENCOUNTER — Ambulatory Visit (INDEPENDENT_AMBULATORY_CARE_PROVIDER_SITE_OTHER): Payer: Medicare Other | Admitting: Internal Medicine

## 2016-03-19 ENCOUNTER — Encounter: Payer: Self-pay | Admitting: Internal Medicine

## 2016-03-19 VITALS — BP 124/77 | HR 90 | Temp 97.9°F

## 2016-03-19 DIAGNOSIS — L03116 Cellulitis of left lower limb: Secondary | ICD-10-CM

## 2016-03-19 DIAGNOSIS — B351 Tinea unguium: Secondary | ICD-10-CM | POA: Diagnosis present

## 2016-03-19 LAB — COMPLETE METABOLIC PANEL WITH GFR
ALBUMIN: 4.3 g/dL (ref 3.6–5.1)
ALK PHOS: 108 U/L (ref 40–115)
ALT: 9 U/L (ref 9–46)
AST: 11 U/L (ref 10–40)
BILIRUBIN TOTAL: 0.3 mg/dL (ref 0.2–1.2)
BUN: 13 mg/dL (ref 7–25)
CO2: 22 mmol/L (ref 20–31)
Calcium: 9.2 mg/dL (ref 8.6–10.3)
Chloride: 103 mmol/L (ref 98–110)
Creat: 0.65 mg/dL (ref 0.60–1.35)
GLUCOSE: 171 mg/dL — AB (ref 65–99)
Potassium: 3.9 mmol/L (ref 3.5–5.3)
Sodium: 137 mmol/L (ref 135–146)
TOTAL PROTEIN: 7.1 g/dL (ref 6.1–8.1)

## 2016-03-20 ENCOUNTER — Encounter (HOSPITAL_COMMUNITY): Payer: Self-pay | Admitting: Emergency Medicine

## 2016-03-20 ENCOUNTER — Emergency Department (HOSPITAL_COMMUNITY)
Admission: EM | Admit: 2016-03-20 | Discharge: 2016-03-21 | Disposition: A | Discharge status: Home / Self Care | Payer: Medicare Other | Attending: Emergency Medicine | Admitting: Emergency Medicine

## 2016-03-20 DIAGNOSIS — Z87891 Personal history of nicotine dependence: Secondary | ICD-10-CM | POA: Diagnosis not present

## 2016-03-20 DIAGNOSIS — J45909 Unspecified asthma, uncomplicated: Secondary | ICD-10-CM | POA: Diagnosis not present

## 2016-03-20 DIAGNOSIS — Z79899 Other long term (current) drug therapy: Secondary | ICD-10-CM | POA: Diagnosis not present

## 2016-03-20 DIAGNOSIS — Z7984 Long term (current) use of oral hypoglycemic drugs: Secondary | ICD-10-CM | POA: Insufficient documentation

## 2016-03-20 DIAGNOSIS — I5032 Chronic diastolic (congestive) heart failure: Secondary | ICD-10-CM | POA: Insufficient documentation

## 2016-03-20 DIAGNOSIS — I110 Hypertensive heart disease with heart failure: Secondary | ICD-10-CM | POA: Insufficient documentation

## 2016-03-20 DIAGNOSIS — R0602 Shortness of breath: Secondary | ICD-10-CM

## 2016-03-20 NOTE — ED Provider Notes (Signed)
[facility] DEPT Provider Note   CSN: [id number] Arrival date & time: 03/20/16  2110   By signing my name below, I, Delton Prairie, attest that this documentation has been prepared under the direction and in the presence of Orpah Greek, MD  Electronically Signed: Delton Prairie, ED Scribe. 03/20/16. 11:15 PM.   History   Chief Complaint Chief Complaint  Patient presents with  . Shortness of Breath    The history is provided by the patient and a caregiver. No language interpreter was used.   HPI Comments:  Thomas Ramos is a 46 y.o. male, with a hx of DM, HTN and CHF, who presents to the Emergency Department complaining of persistent, intermittent SOB x today. He also reports a dry cough. He states he has used his inhaler once with no relief. No exacerbating factors noted. Pt denies any other associated symptoms and any other modifying factors at this time.   Past Medical History:  Diagnosis Date  . Anxiety   . Cerebral palsy ([place])   . Chronic diastolic (congestive) heart failure   . CP (cerebral palsy) ([place])   . Diabetes mellitus without complication (HCC)    borderline  . Environmental allergies    takes inhalers if needed  . Esophageal stricture   . GERD (gastroesophageal reflux disease)   . Hypertension   . Motility disorder, esophageal   . Pneumonia   . 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 S/P Botox injection    approx every 4 months  . Seasonal allergies     Patient Active Problem List   Diagnosis Date Noted  . Onychomycosis 02/20/2016  . Cellulitis 01/13/2016  . Left leg cellulitis 01/13/2016  . Essential hypertension   . OSA (obstructive sleep apnea) 12/11/2015  . Hyponatremia 10/06/2015  . AKI (acute kidney injury) ([place]) 10/06/2015  . Leukocytosis 10/06/2015  . Nausea & vomiting 10/06/2015  . Chronic diastolic (congestive) heart failure   . Leg swelling   . Community acquired pneumonia 09/20/2015  . Depression with anxiety 09/20/2015  .  Cellulitis of left leg 09/20/2015  . CAP (community acquired pneumonia) 09/20/2015  . Hypersomnia 07/16/2015  . Dyspnea 07/16/2015  . Asthma 07/16/2015  . GERD (gastroesophageal reflux disease) 07/16/2015  . Cerebral palsy ([place]) 07/16/2015  . Hematemesis with nausea   . Influenza with pneumonia   . Aspiration pneumonia ([place]) 05/24/2014  . HTN (hypertension) 05/24/2014  . Fever 05/24/2014  . Dysphagia 09/16/2010  . 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 BOTOX INJECTION N/A 12/21/2012   Procedure: BOTOX INJECTION;  Surgeon: Inda Castle, MD;  Location: [facility];  Service: Endoscopy;  Laterality: N/A;  . BOTOX INJECTION N/A 06/28/2014   Procedure: BOTOX INJECTION;  Surgeon: Inda Castle, MD;  Location: [facility];  Service: Endoscopy;  Laterality: N/A;  . ESOPHAGOGASTRODUODENOSCOPY N/A 12/21/2012   Procedure: ESOPHAGOGASTRODUODENOSCOPY (EGD);  Surgeon: Inda Castle, MD;  Location: [facility];  Service: Endoscopy;  Laterality: N/A;  . ESOPHAGOGASTRODUODENOSCOPY N/A 06/28/2014   Procedure: ESOPHAGOGASTRODUODENOSCOPY (EGD);  Surgeon: Inda Castle, MD;  Location: [facility];  Service: Endoscopy;  Laterality: N/A;  . ESOPHAGUS SURGERY     stretched esophagus  . legs    . MOUTH SURGERY    . TENDON RELEASE         Home Medications    Prior to Admission medications   Medication Sig Start Date End Date Taking? Authorizing Provider  albuterol (PROVENTIL HFA;VENTOLIN  HFA) 108 (90 Base) MCG/ACT inhaler Inhale 1-2 puffs into the lungs every 6 (six) hours as needed for wheezing or shortness of breath. 08/26/15   Ripley Fraise, MD  amLODipine (NORVASC) 10 MG tablet Take 10 mg by mouth daily.    Historical Provider, MD  baclofen (LIORESAL) 10 MG tablet Take 10 mg by mouth 3 (three) times daily as needed for muscle spasms. For neck stiffness/pain. 08/30/15   Historical Provider, MD  busPIRone (BUSPAR) 7.5 MG tablet  Take 7.5 mg by mouth 2 (two) times daily as needed (for anxiousness).     Historical Provider, MD  cetirizine (ZYRTEC) 10 MG tablet Take 10 mg by mouth daily.  03/01/14   Historical Provider, MD  CRESTOR 5 MG tablet Take 5 mg by mouth at bedtime. 04/22/14   Historical Provider, MD  fluticasone (FLONASE) 50 MCG/ACT nasal spray Place 1 spray into both nostrils daily as needed for allergies.  03/01/14   Historical Provider, MD  HYDROcodone-acetaminophen (NORCO/VICODIN) 5-325 MG tablet Take 1 tablet by mouth every 6 (six) hours as needed for moderate pain. 11/13/15   Jola Schmidt, MD  metFORMIN (GLUCOPHAGE-XR) 500 MG 24 hr tablet Take 500 mg by mouth daily with breakfast. 01/30/16 01/29/17  Historical Provider, MD  montelukast (SINGULAIR) 10 MG tablet Take 10 mg by mouth daily as needed (allergies).     Historical Provider, MD  mupirocin cream (BACTROBAN) 2 % Apply 1 application topically 2 (two) times daily. 01/31/16   April Palumbo, MD  nitroGLYCERIN (NITROSTAT) 0.4 MG SL tablet Place 0.4 mg under the tongue every 5 (five) minutes as needed. For chest pain. 08/26/15   Historical Provider, MD  omeprazole (PRILOSEC) 20 MG capsule Take 1 capsule (20 mg total) by mouth daily. Patient not taking: Reported on 03/19/2016 08/11/15   April Palumbo, MD  ranitidine (ZANTAC) 150 MG tablet Take 150 mg by mouth 2 (two) times daily as needed for heartburn.  08/26/15   Historical Provider, MD  terbinafine (LAMISIL) 250 MG tablet Take 1 tablet (250 mg total) by mouth daily. 02/20/16   Thayer Headings, MD  valsartan (DIOVAN) 80 MG tablet Take 80 mg by mouth daily. 08/26/15   Historical Provider, MD    Family History Family History  Problem Relation Age of Onset  . Hypertension Father   . Lung cancer Father   . Diabetes Mother     Social History Social History  Substance Use Topics  . Smoking status: Former Research scientist (life sciences)  . Smokeless tobacco: Former Systems developer     Comment: smoked one time  . Alcohol use 0.0 oz/week     Comment: 2  beers every other Friday and Saturday     Allergies   Sulfamethoxazole-trimethoprim; Metronidazole; and Penicillins   Review of Systems Review of Systems  Constitutional: Negative for fever.  Respiratory: Positive for cough and shortness of breath.   All other systems reviewed and are negative.   Physical Exam Updated Vital Signs BP 115/61   Pulse 92   Temp 97.6 F (36.4 C) (Oral)   Resp 17   Ht 5\' 7"  (1.702 m)   Wt 150 lb (68 kg)   SpO2 95%   BMI 23.49 kg/m   Physical Exam  Constitutional: He is oriented to person, place, and time. He appears well-developed and well-nourished. No distress.  HENT:  Head: Normocephalic and atraumatic.  Right Ear: Hearing normal.  Left Ear: Hearing normal.  Nose: Nose normal.  Mouth/Throat: Oropharynx is clear and moist and mucous membranes are normal.  Eyes: Conjunctivae and EOM are normal. Pupils are equal, round, and reactive to light.  Neck: Normal range of motion. Neck supple.  Cardiovascular: Regular rhythm, S1 normal and S2 normal.  Exam reveals no gallop and no friction rub.   No murmur heard. Pulmonary/Chest: Effort normal. No respiratory distress. He exhibits no tenderness.  Breath sounds diminished bilaterally   Abdominal: Soft. Normal appearance and bowel sounds are normal. There is no hepatosplenomegaly. There is no tenderness. There is no rebound, no guarding, no tenderness at McBurney's point and negative Murphy's sign. No hernia.  Musculoskeletal: Normal range of motion.  Chronic contractures due to cerebral palsy  Neurological: He is alert and oriented to person, place, and time. He has normal strength. No cranial nerve deficit or sensory deficit. Coordination normal. GCS eye subscore is 4. GCS verbal subscore is 5. GCS motor subscore is 6.  Skin: Skin is warm, dry and intact. No rash noted. No cyanosis.  Psychiatric: He has a normal mood and affect. His speech is normal and behavior is normal. Thought content normal.    Nursing note and vitals reviewed.    ED Treatments / Results  DIAGNOSTIC STUDIES:  Oxygen Saturation is 99% on RA, normal by my interpretation.    COORDINATION OF CARE:  11:14 PM Discussed treatment plan with pt at bedside and pt agreed to plan.  Labs (all labs ordered are listed, but only abnormal results are displayed) Labs Reviewed  CBC WITH DIFFERENTIAL/PLATELET - Abnormal; Notable for the following:       Result Value   Hemoglobin 12.2 (*)    HCT 36.6 (*)    All other components within normal limits  COMPREHENSIVE METABOLIC PANEL - Abnormal; Notable for the following:    Glucose, Bld 134 (*)    ALT 14 (*)    Total Bilirubin <0.1 (*)    All other components within normal limits  BRAIN NATRIURETIC PEPTIDE  URINALYSIS, ROUTINE W REFLEX MICROSCOPIC  I-STAT TROPOININ, ED    EKG  EKG Interpretation  Date/Time:  Saturday March 21 2016 00:10:10 EST Ventricular Rate:  92 PR Interval:    QRS Duration: 99 QT Interval:  367 QTC Calculation: 454 R Axis:   102 Text Interpretation:  Sinus rhythm Right axis deviation No STEMI.  Confirmed by LONG MD, JOSHUA [phone]) on 03/21/2016 12:13:25 AM       Radiology Dg Chest 2 View  Result Date: 03/21/2016 CLINICAL DATA:  Initial evaluation for intermittent shortness of breath for past week. EXAM: CHEST  2 VIEW COMPARISON:  Prior radiograph from 10/01/2015. FINDINGS: Cardiac and mediastinal silhouettes are stable. Transverse heart size at the upper limits of normal. Shallow lung inflation with elevation of the right hemidiaphragm, stable from prior. Associated bronchovascular crowding at the right lung base. No definite focal infiltrates. No pulmonary edema or pleural effusion. No pneumothorax. No acute osseous abnormality. IMPRESSION: Shallow lung inflation with elevation of the right hemidiaphragm and associated bronchovascular crowding, similar to previous. No other active cardiopulmonary disease identified. Electronically Signed   By:  Jeannine Boga M.D.   On: 03/21/2016 00:31    Procedures Procedures (including critical care time)  Medications Ordered in ED Medications  ipratropium-albuterol (DUONEB) 0.5-2.5 (3) MG/3ML nebulizer solution 3 mL (3 mLs Nebulization Given 03/21/16 0023)     Initial Impression / Assessment and Plan / ED Course  I have reviewed the triage vital signs and the nursing notes.  Pertinent labs & imaging results that were available during my care of the patient were  reviewed by me and considered in my medical decision making (see chart for details).  Clinical Course    Patient presents to the emergency department for evaluation of intermittent episodes of difficulty breathing. He has had associated cough. Patient does have a history of requiring albuterol inhaler, has only used it once today. Patient reports that the shortness of breath comes and goes, not currently present. He has not had any associated fever or chest pain. Vital signs are normal. Workup has been entirely unremarkable. This included chest x-ray. Treat for presumed bronchospasm. Patient does not have leg swelling, tachycardia, hypoxia, pleuritic chest pain. At this point PE is felt to be very unlikely. No evidence of congestive heart failure.  Final Clinical Impressions(s) / ED Diagnoses   Final diagnoses:  SOB (shortness of breath)    New Prescriptions New Prescriptions   No medications on file  I personally performed the services described in this documentation, which was scribed in my presence. The recorded information has been reviewed and is accurate.     Orpah Greek, MD 03/21/16 [phone]

## 2016-03-20 NOTE — ED Triage Notes (Signed)
Patient has been having sob for the last week off and on. Patient is not experiencing any pain. Patient caregiver is with him.

## 2016-03-21 ENCOUNTER — Emergency Department (HOSPITAL_COMMUNITY): Payer: Medicare Other

## 2016-03-21 DIAGNOSIS — R0602 Shortness of breath: Secondary | ICD-10-CM | POA: Diagnosis not present

## 2016-03-21 LAB — COMPREHENSIVE METABOLIC PANEL
ALK PHOS: 113 U/L (ref 38–126)
ALT: 14 U/L — ABNORMAL LOW (ref 17–63)
ANION GAP: 9 (ref 5–15)
AST: 15 U/L (ref 15–41)
Albumin: 4 g/dL (ref 3.5–5.0)
BUN: 16 mg/dL (ref 6–20)
CALCIUM: 9 mg/dL (ref 8.9–10.3)
CO2: 23 mmol/L (ref 22–32)
Chloride: 104 mmol/L (ref 101–111)
Creatinine, Ser: 0.73 mg/dL (ref 0.61–1.24)
GFR calc Af Amer: >60 mL/min (ref >60)
GFR calc non Af Amer: >60 mL/min (ref >60)
GLUCOSE: 134 mg/dL — AB (ref 65–99)
POTASSIUM: 3.7 mmol/L (ref 3.5–5.1)
Sodium: 136 mmol/L (ref 135–145)
TOTAL PROTEIN: 6.8 g/dL (ref 6.5–8.1)

## 2016-03-21 LAB — CBC WITH DIFFERENTIAL/PLATELET
Basophils Absolute: 0 10*3/uL (ref 0.0–0.1)
Basophils Relative: 0 %
Eosinophils Absolute: 0.1 10*3/uL (ref 0.0–0.7)
Eosinophils Relative: 2 %
HEMATOCRIT: 36.6 % — AB (ref 39.0–52.0)
Hemoglobin: 12.2 g/dL — ABNORMAL LOW (ref 13.0–17.0)
LYMPHS PCT: 36 %
Lymphs Abs: 2.2 10*3/uL (ref 0.7–4.0)
MCH: 26.2 pg (ref 26.0–34.0)
MCHC: 33.3 g/dL (ref 30.0–36.0)
MCV: 78.7 fL (ref 78.0–100.0)
MONO ABS: 0.4 10*3/uL (ref 0.1–1.0)
MONOS PCT: 7 %
NEUTROS ABS: 3.3 10*3/uL (ref 1.7–7.7)
NEUTROS PCT: 55 %
Platelets: 185 10*3/uL (ref 150–400)
RBC: 4.65 MIL/uL (ref 4.22–5.81)
RDW: 13.5 % (ref 11.5–15.5)
WBC: 6.1 10*3/uL (ref 4.0–10.5)

## 2016-03-21 LAB — URINALYSIS, ROUTINE W REFLEX MICROSCOPIC
BILIRUBIN URINE: NEGATIVE
GLUCOSE, UA: NEGATIVE mg/dL
Hgb urine dipstick: NEGATIVE
KETONES UR: NEGATIVE mg/dL
LEUKOCYTES UA: NEGATIVE
Nitrite: NEGATIVE
PH: 5 (ref 5.0–8.0)
PROTEIN: NEGATIVE mg/dL
Specific Gravity, Urine: 1.028 (ref 1.005–1.030)

## 2016-03-21 LAB — BRAIN NATRIURETIC PEPTIDE: B NATRIURETIC PEPTIDE 5: 10.3 pg/mL (ref 0.0–100.0)

## 2016-03-21 LAB — I-STAT TROPONIN, ED: Troponin i, poc: 0 ng/mL (ref 0.00–0.08)

## 2016-03-21 MED ORDER — IPRATROPIUM-ALBUTEROL 0.5-2.5 (3) MG/3ML IN SOLN
3.0000 mL | Freq: Once | RESPIRATORY_TRACT | Status: AC
  Administered 2016-03-21: 3 mL via RESPIRATORY_TRACT
  Filled 2016-03-21: qty 3

## 2016-03-21 NOTE — ED Notes (Signed)
Bed: [facility] Expected date:  Expected time:  Means of arrival:  Comments: 

## 2016-03-21 NOTE — ED Notes (Signed)
Pt. In xray 

## 2016-03-21 NOTE — ED Notes (Signed)
Patient is discharged. Waiting on PTAR.

## 2016-03-21 NOTE — Discharge Instructions (Signed)
Use your inhaler up to every 4 hours if needed for shortness of breath.

## 2016-03-23 NOTE — Progress Notes (Signed)
   Subjective:    Patient ID: Thomas Ramos, male    DOB: November 20, 1970, 46 y.o.   MRN: [id number]  HPI Here for follow up of recurrent cellulitis.  No new issues with legs and redness.  I started him on oral lamisil for onychomycosis to help decrease episodes of presumed cellulitis.  He has had diarrhea with lamisil but has been able to continue to take it.  His left great toenail came off.  LFTs wnl.     Review of Systems  Constitutional: Negative for fatigue and fever.  Gastrointestinal: Positive for diarrhea. Negative for nausea.  Skin: Negative for rash.       Objective:   Physical Exam  Constitutional: He appears well-developed and well-nourished. No distress.  Eyes: No scleral icterus.  Musculoskeletal:  Left foot with nail remanants on left great toe, onychomycosis on all nails.  No erythema.  Left leg with edema, chronic, no erythema.           Assessment & Plan:

## 2016-03-23 NOTE — Assessment & Plan Note (Signed)
He is going to try to continue with the lamisil despite the diarrhea but will call if he is unable to continue.  No other good options.  LFTs again today.  rtc 4 weeks.

## 2016-03-23 NOTE — Assessment & Plan Note (Signed)
He is going to bring pictures of previous episodes next visit to see if appearance is c/w cellulitis.

## 2016-03-26 ENCOUNTER — Telehealth: Payer: Self-pay

## 2016-03-26 NOTE — Telephone Encounter (Signed)
Patient was calling for lab results done at last visit with Dr Linus Salmons patient thought his liver functions were checked. No liver testing done on this date.

## 2016-03-27 ENCOUNTER — Ambulatory Visit (INDEPENDENT_AMBULATORY_CARE_PROVIDER_SITE_OTHER): Payer: Medicare Other | Admitting: Pulmonary Disease

## 2016-03-27 ENCOUNTER — Encounter: Payer: Self-pay | Admitting: Pulmonary Disease

## 2016-03-27 DIAGNOSIS — J45909 Unspecified asthma, uncomplicated: Secondary | ICD-10-CM | POA: Diagnosis not present

## 2016-03-27 DIAGNOSIS — K219 Gastro-esophageal reflux disease without esophagitis: Secondary | ICD-10-CM

## 2016-03-27 DIAGNOSIS — G4733 Obstructive sleep apnea (adult) (pediatric): Secondary | ICD-10-CM | POA: Diagnosis not present

## 2016-03-27 NOTE — Addendum Note (Signed)
Addended by: Benson Setting L on: 03/27/2016 03:14 PM   Modules accepted: Orders

## 2016-03-27 NOTE — Progress Notes (Signed)
Subjective:    Patient ID: Thomas Ramos, male    DOB: [birth year], 46 y.o.   MRN: [id number]  HPI   This is the case of Thomas Ramos, 46 y.o. Male, who was referred by Dr. Anastasia Pall and Roe Coombs PA  in consultation regarding dyspnea  And possible OSA.   As you very well know, patient  Is a non smoker.  He is wheelchair bound 2/2 CP.  He lives by himself. Has help with ADLs.  Has not been admitted to hospital recently.  Recently to ER 2/2 SOB episodic. Not sure if related to nerves.  He has "wheezing" every now and then. Uses alb MDI 1x/week. Triggers for wheezing : change in weather and pollen.  Denies sinus issues.  Has GERD -- better. Has SOB when he lays flat.   Has snoring. Has gasping or choking. Denies witnessed apneas.  Sleeps 11 hrs. Has awakenings at night.  Has hypersomnia in am. Occasional naps.   ROV  12/11/15 Patient had a lab sleep study which showed an AHI of 28. He has not been started in CPAP since he wanted to discuss results of his sleep study. Remains to have snoring, gasping, choking, unrefreshed sleep. Has hypersomnia affecting his functionality. He now has help, 24/7. Someones always with him. His breathing has been stable. He has not used his albuterol recently. Has not been admitted nor been in antibiotics recently.  ROV 03/27/2016 Patient returns to the office as follow-up on his sleep apnea. Since last seen, we started him on an auto CPAP. Compliance for one month in October: 10%, AHI 22. AHI elevated likely from leak issues. He states he is using the CPAP machine. He feels or something on a machine >> " it keeps on giving him breaths". Get uncomfortable because of this. His breathing is stable.   Review of Systems  Constitutional: Negative.  Negative for fever and unexpected weight change.  HENT: Negative.  Negative for congestion, dental problem, ear pain, nosebleeds, postnasal drip, rhinorrhea, sinus pressure, sneezing, sore throat and trouble  swallowing.   Eyes: Negative.  Negative for redness and itching.  Respiratory: Positive for wheezing. Negative for cough, chest tightness and shortness of breath.   Cardiovascular: Negative.  Negative for palpitations and leg swelling.  Gastrointestinal: Negative.  Negative for nausea and vomiting.  Endocrine: Negative.   Genitourinary: Negative.  Negative for dysuria.  Musculoskeletal: Positive for arthralgias. Negative for joint swelling.  Skin: Negative.  Negative for rash.  Allergic/Immunologic: Positive for environmental allergies.  Neurological: Positive for dizziness. Negative for headaches.  Hematological: Negative.  Does not bruise/bleed easily.  Psychiatric/Behavioral: Negative.  Negative for dysphoric mood. The patient is not nervous/anxious.      Objective:   Physical Exam   Vitals:  Vitals:   03/27/16 1430  BP: 108/78  Pulse: 98  SpO2: 98%    Constitutional/General:  Pleasant, well-nourished, well-developed, not in any distress,  Comfortably seating. He is on his wheelchair (motorized).  Disheveled Has RUE contracture (chronic)  There is no height or weight on file to calculate BMI. Wt Readings from Last 3 Encounters:  03/20/16 150 lb (68 kg)  01/13/16 165 lb 9.1 oz (75.1 kg)  11/15/15 180 lb (81.6 kg)      HEENT: Pupils equal and reactive to light and accommodation. Anicteric sclerae. Normal nasal mucosa.   No oral  lesions,  mouth clear,  oropharynx clear, no postnasal drip. (-) Oral thrush. No dental caries.  Airway - Mallampati  class III-IV  Neck: No masses. Midline trachea. No JVD, (-) LAD. (-) bruits appreciated.  Respiratory/Chest: Grossly normal chest. (-) deformity. (-) Accessory muscle use.  Symmetric expansion. (-) Tenderness on palpation.  Resonant on percussion.  Diminished BS on both lower lung zones. (-) wheezing, crackles, rhonchi (-) egophony  Cardiovascular: Regular rate and  rhythm, heart sounds normal, no murmur or gallops, no  peripheral edema  Gastrointestinal:  Normal bowel sounds. Soft, non-tender. No hepatosplenomegaly.  (-) masses.   Musculoskeletal:  Normal muscle tone. Normal gait.   Extremities: Grossly normal. (-) clubbing, cyanosis.  (-) edema  Skin: (-) rash,lesions seen.   Neurological/Psychiatric : alert, oriented to time, place, person. Normal mood and affect           Assessment & Plan:  Asthma  Pt has "wheezing" every now and then. Uses alb MDI 1x/week. Triggers for wheezing : change in weather and pollen. CXR (06/2015)  low lung volumes. Atelectasis at the bases. Likely with asthma. He had difficulty doing PFT. Has not used his albuterol in months.  Observe for now. Alb MDI prn. Cont singulair.   OSA (obstructive sleep apnea) Has snoring. Has gasping or choking. Denies witnessed apneas.  Sleeps 11 hrs. Has awakenings at night.  Has hypersomnia in am. Occasional naps.   Sleep lab study : AHI 28.  Patient has cerebral palsy.  He uses CPAP machine. Feels better using it. More energy. He has an issue with the machine and according to him, it keeps firing and delivers breath making him uncomfortable. Download we have was for October for 1 month's: 10%, AHI 22.  Plan:  We extensively discussed the importance of treating OSA and the need to use PAP therapy.   Continue with auto CPAP 5-15 centimeters water. He is AHI is elevated based on October download most likely related to leak issues. He feels better using it, more energy, less sleepiness. Feels benefit of CPAP. We will call his DME to troubleshoot his CPAP machine. We will get a download for this month.   Patient was instructed to have mask, tubings, filter, reservoir cleaned at least once a week with soapy water.  Patient was instructed to call the office if he/she is having issues with the PAP device.    I advised patient to obtain sufficient amount of sleep --  7 to 8 hours at least in a 24 hr period.  Patient was advised  to follow good sleep hygiene.  Patient was advised NOT to engage in activities requiring concentration and/or vigilance if he/she is and  sleepy.  Patient is NOT to drive if he/she is sleepy.   GERD (gastroesophageal reflux disease) cont PPI at HS. Asp precaution. Recent cough and SOB when he lays flat. HOB 30 degrees.     Return to clinic in 6 months.   Monica Becton, MD 03/27/2016, 2:46 PM [facility] Pager [phone] After 3 pm or if no answer, call [phone]

## 2016-03-27 NOTE — Assessment & Plan Note (Signed)
cont PPI at HS. Asp precaution. Recent cough and SOB when he lays flat. HOB 30 degrees.

## 2016-03-27 NOTE — Assessment & Plan Note (Addendum)
Has snoring. Has gasping or choking. Denies witnessed apneas.  Sleeps 11 hrs. Has awakenings at night.  Has hypersomnia in am. Occasional naps.   Sleep lab study : AHI 28.  Patient has cerebral palsy.  He uses CPAP machine. Feels better using it. More energy. He has an issue with the machine and according to him, it keeps firing and delivers breath making him uncomfortable. Download we have was for October for 1 month's: 10%, AHI 22.  Plan:  We extensively discussed the importance of treating OSA and the need to use PAP therapy.   Continue with auto CPAP 5-15 centimeters water. He is AHI is elevated based on October download most likely related to leak issues. He feels better using it, more energy, less sleepiness. Feels benefit of CPAP. We will call his DME to troubleshoot his CPAP machine. We will get a download for this month.   Patient was instructed to have mask, tubings, filter, reservoir cleaned at least once a week with soapy water.  Patient was instructed to call the office if he/she is having issues with the PAP device.    I advised patient to obtain sufficient amount of sleep --  7 to 8 hours at least in a 24 hr period.  Patient was advised to follow good sleep hygiene.  Patient was advised NOT to engage in activities requiring concentration and/or vigilance if he/she is and  sleepy.  Patient is NOT to drive if he/she is sleepy.

## 2016-03-27 NOTE — Addendum Note (Signed)
Addended by: Benson Setting L on: 03/27/2016 03:12 PM   Modules accepted: Orders

## 2016-03-27 NOTE — Assessment & Plan Note (Addendum)
Pt has "wheezing" every now and then. Uses alb MDI 1x/week. Triggers for wheezing : change in weather and pollen. CXR (06/2015)  low lung volumes. Atelectasis at the bases. Likely with asthma. He had difficulty doing PFT. Has not used his albuterol in months.  Observe for now. Alb MDI prn. Cont singulair.

## 2016-03-27 NOTE — Patient Instructions (Signed)
  It was a pleasure taking care of you today!  Continue using your CPAP machine.  We will call your company to troubleshoot your  CPAP machine.  Please make sure you use your CPAP device everytime you sleep.  We will monitor the usage of your machine per your insurance requirement.  Your insurance company may take the machine from you if you are not using it regularly.   Please clean the mask, tubings, filter, water reservoir with soapy water every week.  Please use distilled water for the water reservoir.   Please call the office or your machine provider (DME company) if you are having issues with the device.   Return to clinic in 6 months   with Dr. Corrie Dandy

## 2016-03-30 ENCOUNTER — Telehealth: Payer: Self-pay | Admitting: Pulmonary Disease

## 2016-03-30 DIAGNOSIS — G4733 Obstructive sleep apnea (adult) (pediatric): Secondary | ICD-10-CM

## 2016-03-30 NOTE — Telephone Encounter (Signed)
Spoke with Larkin Ina with [employer]. Larkin Ina states pt's machine will have to be picked up, due to pt not wearing his machine and not meeting the 70% usage that is required per insurance. Larkin Ina states he went out to pt's house weekly to assist him with his machine.  Nothing further needed.   I will route this to AD as a FYI.

## 2016-03-30 NOTE — Telephone Encounter (Signed)
Noted.  D/C ASV as pt is not compliant.   Monica Becton, MD 03/30/2016, 1:32 PM [facility] Pager [phone] After 3 pm or if no answer, call [phone]

## 2016-03-31 NOTE — Telephone Encounter (Signed)
[phone] pt calling back

## 2016-03-31 NOTE — Telephone Encounter (Signed)
Spoke with pt. He is aware that we are going to have to discontinue his ASV device. Pt verbalized understanding. Order has been placed. Nothing further was needed.

## 2016-03-31 NOTE — Telephone Encounter (Signed)
Patient is calling stating his mask is broken on cpap, CB is [phone].

## 2016-03-31 NOTE — Telephone Encounter (Signed)
Attempted to contact pt x2. No answer, no option to leave a message. Will try back.  

## 2016-03-31 NOTE — Telephone Encounter (Signed)
Attempted to contact pt. No answer, no option to leave a message. Will try back.  

## 2016-04-07 ENCOUNTER — Telehealth: Payer: Self-pay | Admitting: Pulmonary Disease

## 2016-04-07 NOTE — Telephone Encounter (Signed)
lmtcb

## 2016-04-08 NOTE — Telephone Encounter (Signed)
lmtcb X1 for pt  

## 2016-04-08 NOTE — Telephone Encounter (Signed)
lmtcb x2 for pt. 

## 2016-04-08 NOTE — Telephone Encounter (Signed)
   I do not have to see the patient as he stopped using his ASV machine.  No need to follow up. Thanks.   Monica Becton, MD 04/08/2016, 3:23 PM [facility] Pager [phone] After 3 pm or if no answer, call [phone]

## 2016-04-08 NOTE — Telephone Encounter (Signed)
Pt returned call, he would like to know if he still needs to be seen in July as recommended at his last ov.  Advised pt that AD will likely want him to keep this appt so that we can check on him but patient was insistent that his message be forwarded to AD.  Dr Corrie Dandy please advise, thank you.  Per 1.12.18 ov: Patient Instructions    It was a pleasure taking care of you today!   Continue using your CPAP machine.  We will call your company to troubleshoot your  CPAP machine.   Please make sure you use your CPAP device everytime you sleep.  We will monitor the usage of your machine per your insurance requirement.  Your insurance company may take the machine from you if you are not using it regularly.    Please clean the mask, tubings, filter, water reservoir with soapy water every week.  Please use distilled water for the water reservoir.    Please call the office or your machine provider (DME company) if you are having issues with the device.    Return to clinic in 6 months   with Dr. Corrie Dandy

## 2016-04-09 NOTE — Telephone Encounter (Signed)
Pt returned call Advised pt that there is no need to follow up since he stopped his ASV machine per AD below Pt voiced his understanding Nothing further needed; will sign off

## 2016-04-20 ENCOUNTER — Ambulatory Visit (INDEPENDENT_AMBULATORY_CARE_PROVIDER_SITE_OTHER): Payer: Medicare Other | Admitting: Internal Medicine

## 2016-04-20 DIAGNOSIS — L03116 Cellulitis of left lower limb: Secondary | ICD-10-CM | POA: Diagnosis not present

## 2016-04-20 DIAGNOSIS — B351 Tinea unguium: Secondary | ICD-10-CM | POA: Diagnosis present

## 2016-04-20 NOTE — Patient Instructions (Signed)
Stop the terbinafine Return for any new flare ups when they happen

## 2016-04-21 ENCOUNTER — Other Ambulatory Visit: Payer: Self-pay | Admitting: Internal Medicine

## 2016-04-21 NOTE — Assessment & Plan Note (Signed)
Recurrent episodes of cellulitis though none since I have seen him.  In his descriptions, I am not sure if they are all cellulitis vs vascular changes.  Next episode, he is going to come back here if possible to be seen and he will call at that time.  Otherwise, rtc PRN

## 2016-04-21 NOTE — Assessment & Plan Note (Signed)
He has completed 2 months of treatment and with his ongoing GI issues, I will stop.  Hopefully some benefit in prevention of cellulitis.

## 2016-04-21 NOTE — Progress Notes (Signed)
   Subjective:    Patient ID: Thomas Ramos, male    DOB: Mar 27, 1970, 46 y.o.   MRN: [id number]  HPI Here for follow up of recurrent cellulitis.  No new issues with legs and redness.  Now has been on oral lamisil for onychomycosis to help decrease episodes of presumed cellulitis.  He has had diarrhea with lamisil but has been able to continue to take it and this improved since his last visit.  LFTs wnl.     Review of Systems  Constitutional: Negative for fatigue and fever.  Gastrointestinal: Positive for nausea. Negative for diarrhea.  Skin: Negative for rash.       Objective:   Physical Exam  Constitutional: He appears well-developed and well-nourished. No distress.  Eyes: No scleral icterus.  Musculoskeletal:  Left foot with nail remanants on left great toe, onychomycosis on all nails.  No erythema.  Left leg with edema, chronic, no erythema.           Assessment & Plan:

## 2016-04-23 ENCOUNTER — Encounter (HOSPITAL_COMMUNITY): Payer: Self-pay | Admitting: *Deleted

## 2016-04-23 ENCOUNTER — Emergency Department (HOSPITAL_COMMUNITY)
Admission: EM | Admit: 2016-04-23 | Discharge: 2016-04-24 | Disposition: A | Discharge status: Home / Self Care | Payer: Medicare Other | Attending: Physician Assistant | Admitting: Physician Assistant

## 2016-04-23 DIAGNOSIS — Z79899 Other long term (current) drug therapy: Secondary | ICD-10-CM | POA: Diagnosis not present

## 2016-04-23 DIAGNOSIS — I5032 Chronic diastolic (congestive) heart failure: Secondary | ICD-10-CM | POA: Diagnosis not present

## 2016-04-23 DIAGNOSIS — Z87891 Personal history of nicotine dependence: Secondary | ICD-10-CM | POA: Diagnosis not present

## 2016-04-23 DIAGNOSIS — I10 Essential (primary) hypertension: Secondary | ICD-10-CM | POA: Diagnosis not present

## 2016-04-23 DIAGNOSIS — J45909 Unspecified asthma, uncomplicated: Secondary | ICD-10-CM | POA: Insufficient documentation

## 2016-04-23 DIAGNOSIS — I110 Hypertensive heart disease with heart failure: Secondary | ICD-10-CM | POA: Insufficient documentation

## 2016-04-23 DIAGNOSIS — L03116 Cellulitis of left lower limb: Secondary | ICD-10-CM | POA: Diagnosis not present

## 2016-04-23 DIAGNOSIS — Z7984 Long term (current) use of oral hypoglycemic drugs: Secondary | ICD-10-CM | POA: Diagnosis not present

## 2016-04-23 MED ORDER — CLINDAMYCIN HCL 150 MG PO CAPS: 150.0000 mg | ORAL_CAPSULE | Freq: Four times a day (QID) | ORAL | 0 refills | Status: DC

## 2016-04-23 MED ORDER — CLINDAMYCIN HCL 300 MG PO CAPS
300.0000 mg | ORAL_CAPSULE | Freq: Once | ORAL | Status: AC
  Administered 2016-04-23: 300 mg via ORAL
  Filled 2016-04-23: qty 1

## 2016-04-23 NOTE — ED Notes (Signed)
Notified [facility] for transportation back to his residents.   

## 2016-04-23 NOTE — ED Notes (Signed)
Bed: [facility] Expected date:  Expected time:  Means of arrival:  Comments: EMS 

## 2016-04-23 NOTE — ED Provider Notes (Signed)
[facility] DEPT Provider Note   CSN: [id number] Arrival date & time: 04/23/16  1947     History   Chief Complaint Chief Complaint  Patient presents with  . Hypertension    HPI Thomas Ramos is a 46 y.o. male.  HPI   Patient is a 46 year old male presenting with hypertension. Patient had one recording of high blood pressure. Apparently had one reading of blood pressure were was 150/100. EMS was called and his blood pressure was normal on her arrival of EMS, 132/60. Patient brought here to the emergency department.  Patient also complains of redness and swelling to his left lower= extremity.  Past Medical History:  Diagnosis Date  . Anxiety   . Cerebral palsy ([place])   . Chronic diastolic (congestive) heart failure   . CP (cerebral palsy) ([place])   . Diabetes mellitus without complication (HCC)    borderline  . Environmental allergies    takes inhalers if needed  . Esophageal stricture   . GERD (gastroesophageal reflux disease)   . Hypertension   . Motility disorder, esophageal   . Pneumonia   . 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 S/P Botox injection    approx every 4 months  . Seasonal allergies     Patient Active Problem List   Diagnosis Date Noted  . Onychomycosis 02/20/2016  . Cellulitis 01/13/2016  . Left leg cellulitis 01/13/2016  . Essential hypertension   . OSA (obstructive sleep apnea) 12/11/2015  . Hyponatremia 10/06/2015  . AKI (acute kidney injury) ([place]) 10/06/2015  . Leukocytosis 10/06/2015  . Nausea & vomiting 10/06/2015  . Chronic diastolic (congestive) heart failure   . Leg swelling   . Community acquired pneumonia 09/20/2015  . Depression with anxiety 09/20/2015  . Cellulitis of left leg 09/20/2015  . CAP (community acquired pneumonia) 09/20/2015  . Hypersomnia 07/16/2015  . Dyspnea 07/16/2015  . Asthma 07/16/2015  . GERD (gastroesophageal reflux disease) 07/16/2015  . Cerebral palsy ([place]) 07/16/2015  . Hematemesis with nausea   .  Influenza with pneumonia   . Aspiration pneumonia ([place]) 05/24/2014  . HTN (hypertension) 05/24/2014  . Dysphagia 09/16/2010  . 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 BOTOX INJECTION N/A 12/21/2012   Procedure: BOTOX INJECTION;  Surgeon: Inda Castle, MD;  Location: [facility];  Service: Endoscopy;  Laterality: N/A;  . BOTOX INJECTION N/A 06/28/2014   Procedure: BOTOX INJECTION;  Surgeon: Inda Castle, MD;  Location: [facility];  Service: Endoscopy;  Laterality: N/A;  . ESOPHAGOGASTRODUODENOSCOPY N/A 12/21/2012   Procedure: ESOPHAGOGASTRODUODENOSCOPY (EGD);  Surgeon: Inda Castle, MD;  Location: [facility];  Service: Endoscopy;  Laterality: N/A;  . ESOPHAGOGASTRODUODENOSCOPY N/A 06/28/2014   Procedure: ESOPHAGOGASTRODUODENOSCOPY (EGD);  Surgeon: Inda Castle, MD;  Location: [facility];  Service: Endoscopy;  Laterality: N/A;  . ESOPHAGUS SURGERY     stretched esophagus  . legs    . MOUTH SURGERY    . TENDON RELEASE         Home Medications    Prior to Admission medications   Medication Sig Start Date End Date Taking? Authorizing Provider  albuterol (PROVENTIL HFA;VENTOLIN HFA) 108 (90 Base) MCG/ACT inhaler Inhale 1-2 puffs into the lungs every 6 (six) hours as needed for wheezing or shortness of breath. 08/26/15   Ripley Fraise, MD  amLODipine (NORVASC) 10 MG tablet Take 10 mg by mouth daily.    Historical Provider, MD  baclofen (LIORESAL) 10 MG tablet  Take 10 mg by mouth 3 (three) times daily as needed for muscle spasms. For neck stiffness/pain. 08/30/15   Historical Provider, MD  busPIRone (BUSPAR) 7.5 MG tablet Take 7.5 mg by mouth 2 (two) times daily as needed (for anxiousness).     Historical Provider, MD  cetirizine (ZYRTEC) 10 MG tablet Take 10 mg by mouth daily.  03/01/14   Historical Provider, MD  CRESTOR 5 MG tablet Take 5 mg by mouth at bedtime. 04/22/14   Historical Provider, MD  fluticasone  (FLONASE) 50 MCG/ACT nasal spray Place 1 spray into both nostrils daily as needed for allergies.  03/01/14   Historical Provider, MD  HYDROcodone-acetaminophen (NORCO/VICODIN) 5-325 MG tablet Take 1 tablet by mouth every 6 (six) hours as needed for moderate pain. 11/13/15   Jola Schmidt, MD  metFORMIN (GLUCOPHAGE-XR) 500 MG 24 hr tablet Take 500 mg by mouth daily with breakfast. 01/30/16 01/29/17  Historical Provider, MD  montelukast (SINGULAIR) 10 MG tablet Take 10 mg by mouth daily as needed (allergies).     Historical Provider, MD  mupirocin cream (BACTROBAN) 2 % Apply 1 application topically 2 (two) times daily. 01/31/16   April Palumbo, MD  nitroGLYCERIN (NITROSTAT) 0.4 MG SL tablet Place 0.4 mg under the tongue every 5 (five) minutes as needed for chest pain. For chest pain. 08/26/15   Historical Provider, MD  omeprazole (PRILOSEC) 20 MG capsule Take 1 capsule (20 mg total) by mouth daily. Patient not taking: Reported on 03/27/2016 08/11/15   April Palumbo, MD  ranitidine (ZANTAC) 150 MG tablet Take 150 mg by mouth 2 (two) times daily as needed for heartburn.  08/26/15   Historical Provider, MD  valsartan (DIOVAN) 80 MG tablet Take 80 mg by mouth daily. 08/26/15   Historical Provider, MD    Family History Family History  Problem Relation Age of Onset  . Hypertension Father   . Lung cancer Father   . Diabetes Mother     Social History Social History  Substance Use Topics  . Smoking status: Former Research scientist (life sciences)  . Smokeless tobacco: Former Systems developer     Comment: smoked one time  . Alcohol use 0.0 oz/week     Comment: 2 beers every other Friday and Saturday     Allergies   Sulfamethoxazole-trimethoprim; Metronidazole; and Penicillins   Review of Systems Review of Systems  Constitutional: Negative for fatigue and fever.  Respiratory: Negative for cough and shortness of breath.   Cardiovascular: Negative for chest pain.  Gastrointestinal: Negative for abdominal pain, nausea and vomiting.  All  other systems reviewed and are negative.    Physical Exam Updated Vital Signs BP 118/68 (BP Location: Left Arm)   Pulse 98   Temp 97.6 F (36.4 C) (Oral)   Resp 18   Ht 5\' 5"  (1.651 m)   Wt 155 lb (70.3 kg)   SpO2 95%   BMI 25.79 kg/m   Physical Exam  Constitutional: He is oriented to person, place, and time. He appears well-nourished.  Patient with body habitus consistent cerebral palsy  HENT:  Head: Normocephalic.  Eyes: Conjunctivae are normal.  Cardiovascular: Normal rate and regular rhythm.   No murmur heard. Pulmonary/Chest: Effort normal and breath sounds normal.  Musculoskeletal:  Patient with contractures and shortened bilateral lower extremities. Mild warmth and redness to left lower extremity.  Neurological: He is oriented to person, place, and time.  Skin: Skin is warm and dry. He is not diaphoretic.  Psychiatric: He has a normal mood and affect. His behavior  is normal.     ED Treatments / Results  Labs (all labs ordered are listed, but only abnormal results are displayed) Labs Reviewed - No data to display  EKG  EKG Interpretation None       Radiology No results found.  Procedures Procedures (including critical care time)  Medications Ordered in ED Medications - No data to display   Initial Impression / Assessment and Plan / ED Course  I have reviewed the triage vital signs and the nursing notes.  Pertinent labs & imaging results that were available during my care of the patient were reviewed by me and considered in my medical decision making (see chart for details).     Patient is a 46 year old male brought to the emergency department with hypertension. Patient had 1 episode today where he was hypertensive. Otherwise normal. No chest pain. No headaches. According to ACEP guidelines this will not need to be treated in the emergency department. Patient does point out mild red and warm to the left lower extremity. We'll treat for cellulitis.  Patient requesting some clindamycin.  Patient requesting that I call his prescription into [facility] in [place]. We will do this but we will also provide him with a physical  prescription. We'll do first dose here.  Final Clinical Impressions(s) / ED Diagnoses   Final diagnoses:  None    New Prescriptions New Prescriptions   No medications on file     Teneisha Gignac Julio Alm, MD 04/23/16 2221

## 2016-04-23 NOTE — ED Triage Notes (Signed)
Pt comes from home with reports of BP of 150/100 per home health RN.  Pt has hx of Cerebral Palsy.  Pt's BP with EMS is 132/82.  Pt has hx of hypertension.  Pt sts some foot swelling as well.  NAD.

## 2016-04-23 NOTE — Discharge Instructions (Signed)
Please take antibiotics as prescribed. Please follow-up with your primary care physician to look at your leg within 2 days. Please return if you have any increasing redness, or other concerns.

## 2016-04-28 ENCOUNTER — Ambulatory Visit: Payer: Medicare Other | Admitting: Internal Medicine

## 2016-05-01 ENCOUNTER — Emergency Department (HOSPITAL_COMMUNITY)
Admission: EM | Admit: 2016-05-01 | Discharge: 2016-05-02 | Disposition: A | Discharge status: Home / Self Care | Payer: Medicare Other | Attending: Emergency Medicine | Admitting: Emergency Medicine

## 2016-05-01 ENCOUNTER — Emergency Department (HOSPITAL_BASED_OUTPATIENT_CLINIC_OR_DEPARTMENT_OTHER)
Admit: 2016-05-01 | Discharge: 2016-05-01 | Disposition: A | Discharge status: Home / Self Care | Payer: Medicare Other | Attending: Student | Admitting: Student

## 2016-05-01 ENCOUNTER — Encounter (HOSPITAL_COMMUNITY): Payer: Self-pay | Admitting: Nurse Practitioner

## 2016-05-01 DIAGNOSIS — L03116 Cellulitis of left lower limb: Secondary | ICD-10-CM

## 2016-05-01 DIAGNOSIS — Z79899 Other long term (current) drug therapy: Secondary | ICD-10-CM | POA: Diagnosis not present

## 2016-05-01 DIAGNOSIS — M7989 Other specified soft tissue disorders: Secondary | ICD-10-CM

## 2016-05-01 DIAGNOSIS — I110 Hypertensive heart disease with heart failure: Secondary | ICD-10-CM | POA: Insufficient documentation

## 2016-05-01 DIAGNOSIS — Z87891 Personal history of nicotine dependence: Secondary | ICD-10-CM | POA: Insufficient documentation

## 2016-05-01 DIAGNOSIS — I5032 Chronic diastolic (congestive) heart failure: Secondary | ICD-10-CM | POA: Diagnosis not present

## 2016-05-01 LAB — CBC WITH DIFFERENTIAL/PLATELET
BASOS PCT: 0 %
Basophils Absolute: 0 10*3/uL (ref 0.0–0.1)
EOS ABS: 0.1 10*3/uL (ref 0.0–0.7)
Eosinophils Relative: 2 %
HCT: 38.6 % — ABNORMAL LOW (ref 39.0–52.0)
HEMOGLOBIN: 12.9 g/dL — AB (ref 13.0–17.0)
LYMPHS ABS: 2.2 10*3/uL (ref 0.7–4.0)
Lymphocytes Relative: 37 %
MCH: 26.1 pg (ref 26.0–34.0)
MCHC: 33.4 g/dL (ref 30.0–36.0)
MCV: 78.1 fL (ref 78.0–100.0)
Monocytes Absolute: 0.5 10*3/uL (ref 0.1–1.0)
Monocytes Relative: 8 %
NEUTROS ABS: 3.2 10*3/uL (ref 1.7–7.7)
NEUTROS PCT: 53 %
Platelets: 219 10*3/uL (ref 150–400)
RBC: 4.94 MIL/uL (ref 4.22–5.81)
RDW: 13.5 % (ref 11.5–15.5)
WBC: 5.9 10*3/uL (ref 4.0–10.5)

## 2016-05-01 MED ORDER — DOXYCYCLINE HYCLATE 100 MG PO TABS
100.0000 mg | ORAL_TABLET | Freq: Once | ORAL | Status: AC
  Administered 2016-05-01: 100 mg via ORAL
  Filled 2016-05-01: qty 1

## 2016-05-01 MED ORDER — DOXYCYCLINE HYCLATE 100 MG PO CAPS: 100.0000 mg | ORAL_CAPSULE | Freq: Two times a day (BID) | ORAL | 0 refills | Status: DC

## 2016-05-01 NOTE — Discharge Instructions (Signed)
Make an appointment with Dr. Linus Salmons, the infectious disease doctor to check your cellulitis within  the next 3-4 days.

## 2016-05-01 NOTE — ED Notes (Signed)
Bed: [id number] Expected date:  Expected time:  Means of arrival:  Comments: Hold for ultrsound

## 2016-05-01 NOTE — Progress Notes (Signed)
**  Preliminary report by tech**  Left lower extremity venous duplex completed. There is no obvious evidence of deep or superficial vein thrombosis involving the left lower extremity. All clearly visualized vessels appear patent and compressible. There is no evidence of a Baker's cysts on the left. Results given to Ocie Cornfield PA.  05/01/16 5:22 PM Thomas Ramos RVT

## 2016-05-01 NOTE — ED Notes (Signed)
Bed: [facility] Expected date:  Expected time:  Means of arrival:  Comments: Hold for 30

## 2016-05-01 NOTE — ED Notes (Signed)
[facility] called for transportation  

## 2016-05-01 NOTE — ED Notes (Signed)
Bed: [facility] Expected date: 05/01/16 Expected time: 1:58 PM Means of arrival: Ambulance Comments: Cellulitus of leg

## 2016-05-01 NOTE — ED Triage Notes (Signed)
Patient presents today for evaluation of chronic cellulitis to lower extremities. Says he wants to stay until it is resolved & says he has completed his course of oral antibiotics.

## 2016-05-02 NOTE — ED Notes (Signed)
Called back to [facility] followed up ETA for pick up. [facility] on their way for his transport.

## 2016-05-06 NOTE — ED Provider Notes (Signed)
[facility] DEPT Provider Note   CSN: [id number] Arrival date & time: 05/01/16  1413     History   Chief Complaint Chief Complaint  Patient presents with  . Leg Swelling    HPI Thomas Ramos is a 46 y.o. male.  HPI 46 year old Caucasian male with past medical history significant for cerebral palsy, CHF, diabetes, GERD that presents to the ED today with complaints of evaluation of chronic cellulitis to lower extremities. He also endorses socially swelling of his left leg. Was recently started on clindamycin on 04/23/16 after being evaluated in the ED for same. States that he has completed his antibiotics. Mild improvement in his symptoms. However he states that he does not want to be discharged until his symptoms completely improved. Patient was seen on 04/21/16 by infectious disease for recurrent episodes of chronic cellulitis of lower extremity is. At that time they are unsure if this is due to cellulitis versus vascular changes. Patient has been on Lamisil for onychomycosis to help decrease episodes of presumed cellulitis that was started by his infectious disease doctor. Patient denies any fever, chills, nausea, vomiting, abdominal pain, calf tenderness, weakness, paresthesias green associated symptoms. Past Medical History:  Diagnosis Date  . Anxiety   . Cerebral palsy ([place])   . Chronic diastolic (congestive) heart failure   . CP (cerebral palsy) ([place])   . Diabetes mellitus without complication (HCC)    borderline  . Environmental allergies    takes inhalers if needed  . Esophageal stricture   . GERD (gastroesophageal reflux disease)   . Hypertension   . Motility disorder, esophageal   . Pneumonia   . 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 S/P Botox injection    approx every 4 months  . Seasonal allergies   There are  Patient Active Problem List   Diagnosis Date Noted  . Onychomycosis 02/20/2016  . Cellulitis 01/13/2016  . Left leg cellulitis 01/13/2016  . Essential  hypertension   . OSA (obstructive sleep apnea) 12/11/2015  . Hyponatremia 10/06/2015  . AKI (acute kidney injury) ([place]) 10/06/2015  . Leukocytosis 10/06/2015  . Nausea & vomiting 10/06/2015  . Chronic diastolic (congestive) heart failure   . Leg swelling   . Community acquired pneumonia 09/20/2015  . Depression with anxiety 09/20/2015  . Cellulitis of left leg 09/20/2015  . CAP (community acquired pneumonia) 09/20/2015  . Hypersomnia 07/16/2015  . Dyspnea 07/16/2015  . Asthma 07/16/2015  . GERD (gastroesophageal reflux disease) 07/16/2015  . Cerebral palsy ([place]) 07/16/2015  . Hematemesis with nausea   . Influenza with pneumonia   . Aspiration pneumonia ([place]) 05/24/2014  . HTN (hypertension) 05/24/2014  . Dysphagia 09/16/2010  . 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 BOTOX INJECTION N/A 12/21/2012   Procedure: BOTOX INJECTION;  Surgeon: Inda Castle, MD;  Location: [facility];  Service: Endoscopy;  Laterality: N/A;  . BOTOX INJECTION N/A 06/28/2014   Procedure: BOTOX INJECTION;  Surgeon: Inda Castle, MD;  Location: [facility];  Service: Endoscopy;  Laterality: N/A;  . ESOPHAGOGASTRODUODENOSCOPY N/A 12/21/2012   Procedure: ESOPHAGOGASTRODUODENOSCOPY (EGD);  Surgeon: Inda Castle, MD;  Location: [facility];  Service: Endoscopy;  Laterality: N/A;  . ESOPHAGOGASTRODUODENOSCOPY N/A 06/28/2014   Procedure: ESOPHAGOGASTRODUODENOSCOPY (EGD);  Surgeon: Inda Castle, MD;  Location: [facility];  Service: Endoscopy;  Laterality: N/A;  . ESOPHAGUS SURGERY     stretched esophagus  . legs    . MOUTH SURGERY    .  TENDON RELEASE         Home Medications    Prior to Admission medications   Medication Sig Start Date End Date Taking? Authorizing Provider  acidophilus (RISAQUAD) CAPS capsule Take 1 capsule by mouth daily.   Yes Historical Provider, MD  albuterol (PROVENTIL HFA;VENTOLIN HFA) 108 (90 Base) MCG/ACT  inhaler Inhale 1-2 puffs into the lungs every 6 (six) hours as needed for wheezing or shortness of breath. 08/26/15  Yes Ripley Fraise, MD  amLODipine (NORVASC) 10 MG tablet Take 10 mg by mouth daily.   Yes Historical Provider, MD  baclofen (LIORESAL) 10 MG tablet Take 10 mg by mouth 3 (three) times daily as needed for muscle spasms.    Yes Historical Provider, MD  busPIRone (BUSPAR) 7.5 MG tablet Take 7.5 mg by mouth 2 (two) times daily as needed (for anxiety).    Yes Historical Provider, MD  cetirizine (ZYRTEC) 10 MG tablet Take 10 mg by mouth daily.    Yes Historical Provider, MD  citalopram (CELEXA) 10 MG tablet Take 10 mg by mouth daily.   Yes Historical Provider, MD  clindamycin (CLEOCIN) 150 MG capsule Take 1 capsule (150 mg total) by mouth every 6 (six) hours. 04/23/16  Yes Courteney Lyn Mackuen, MD  fluticasone (FLONASE) 50 MCG/ACT nasal spray Place 1 spray into both nostrils daily as needed for rhinitis.    Yes Historical Provider, MD  HYDROcodone-acetaminophen (NORCO/VICODIN) 5-325 MG tablet Take 1 tablet by mouth every 6 (six) hours as needed for moderate pain. 11/13/15  Yes Jola Schmidt, MD  montelukast (SINGULAIR) 10 MG tablet Take 10 mg by mouth at bedtime.    Yes Historical Provider, MD  mupirocin cream (BACTROBAN) 2 % Apply 1 application topically 2 (two) times daily. 01/31/16  Yes April Palumbo, MD  nitroGLYCERIN (NITROSTAT) 0.4 MG SL tablet Place 0.4 mg under the tongue every 5 (five) minutes as needed for chest pain.    Yes Historical Provider, MD  ranitidine (ZANTAC) 150 MG tablet Take 150 mg by mouth 2 (two) times daily.    Yes Historical Provider, MD  rosuvastatin (CRESTOR) 5 MG tablet Take 5 mg by mouth at bedtime.   Yes Historical Provider, MD  valsartan (DIOVAN) 80 MG tablet Take 80 mg by mouth daily.   Yes Historical Provider, MD  doxycycline (VIBRAMYCIN) 100 MG capsule Take 1 capsule (100 mg total) by mouth 2 (two) times daily. 05/01/16   Charlesetta Shanks, MD    Family  History Family History  Problem Relation Age of Onset  . Hypertension Father   . Lung cancer Father   . Diabetes Mother     Social History Social History  Substance Use Topics  . Smoking status: Former Research scientist (life sciences)  . Smokeless tobacco: Former Systems developer     Comment: smoked one time  . Alcohol use 0.0 oz/week     Comment: 2 beers every other Friday and Saturday     Allergies   Sulfamethoxazole-trimethoprim; Metronidazole; and Penicillins   Review of Systems Review of Systems  Constitutional: Negative for chills and fever.  Gastrointestinal: Negative for diarrhea, nausea and vomiting.  Musculoskeletal: Negative for myalgias.  Skin: Positive for color change, rash and wound.  Neurological: Negative for light-headedness and headaches.  All other systems reviewed and are negative.    Physical Exam Updated Vital Signs BP 130/66 (BP Location: Left Arm)   Pulse 83   Temp 98 F (36.7 C) (Oral)   Resp 19   SpO2 95%   Physical Exam  Constitutional:  He is oriented to person, place, and time. He appears well-developed and well-nourished. No distress.  Patient is nontoxic appearing.  HENT:  Head: Normocephalic and atraumatic.  Mouth/Throat: Oropharynx is clear and moist.  Eyes: Conjunctivae are normal. Right eye exhibits no discharge. Left eye exhibits no discharge. No scleral icterus.  Neck: Normal range of motion. Neck supple. No thyromegaly present.  Cardiovascular: Normal rate, regular rhythm, normal heart sounds and intact distal pulses.   Pulmonary/Chest: Effort normal and breath sounds normal.  Abdominal: Soft. Bowel sounds are normal. He exhibits no distension. There is no tenderness. There is no rebound and no guarding.  Musculoskeletal: Normal range of motion.  Patient with contractures and shortened bilateral lower extremities. Mild warmth and redness to left lower extremity. Appearance appears chronic. Mild edema to the left lower extremities. DP pulses are 2+ bilaterally.  Strength 5 out of 5 in lower shortness. Cap refill normal.  Lymphadenopathy:    He has no cervical adenopathy.  Neurological: He is alert and oriented to person, place, and time.  Skin: Skin is warm and dry. Capillary refill takes less than 2 seconds.  Nursing note and vitals reviewed.  Patient with contractures and shortened bilateral lower extremities. Mild warmth and redness to left lower extremity.  ED Treatments / Results  Labs (all labs ordered are listed, but only abnormal results are displayed) Labs Reviewed  CBC WITH DIFFERENTIAL/PLATELET - Abnormal; Notable for the following:       Result Value   Hemoglobin 12.9 (*)    HCT 38.6 (*)    All other components within normal limits    EKG  EKG Interpretation None       Radiology No results found.  Procedures Procedures (including critical care time)  Medications Ordered in ED Medications  doxycycline (VIBRA-TABS) tablet 100 mg (100 mg Oral Given 05/01/16 2122)     Initial Impression / Assessment and Plan / ED Course  I have reviewed the triage vital signs and the nursing notes.  Pertinent labs & imaging results that were available during my care of the patient were reviewed by me and considered in my medical decision making (see chart for details).     Patient presents to the ED with complaints of lower extremity cellulitis, left lower extremity edema and pain. On review of patient's past medical history this seems to be a chronic issue. Has been seen by ID and has follow-up. They feel this is likely chronic cellulitis first vascular changes. Has follow-up in there office. Exam shows mild erythema to the left lower actually. Mild edema. Minimal warmth. Exam seems more consistent with possible venous stasis. Patient is neurovascularly intact. Given mild edema to left lower actually DVT study was ordered. No signs of DVT. Patient has no leukocytosis. He is afebrile and not tachycardic. He is nontoxic appearing. This is  likely chronic issue. Was recently on clindamycin. We'll switch to doxycycline. Patient was seen and evaluated by Dr. Scherrie November is agreeable to the above plan. Encouraged follow-up with his infectious disease doctor and primary care doctor. Return to ED if his symptoms worsen. Pt is hemodynamically stable, in NAD, & able to ambulate in the ED. Pain has been managed & has no complaints prior to dc. Pt is comfortable with above plan and is stable for discharge at this time. All questions were answered prior to disposition. Strict return precautions for f/u to the ED were discussed.   Final Clinical Impressions(s) / ED Diagnoses   Final diagnoses:  Cellulitis of  left lower extremity    New Prescriptions Discharge Medication List as of 05/01/2016  8:01 PM    START taking these medications   Details  doxycycline (VIBRAMYCIN) 100 MG capsule Take 1 capsule (100 mg total) by mouth 2 (two) times daily., Starting Fri 05/01/2016, Print         Doristine Devoid, PA-C 05/06/16 [place], MD 05/08/16 1535

## 2016-05-12 ENCOUNTER — Telehealth: Payer: Self-pay

## 2016-05-12 NOTE — Telephone Encounter (Signed)
[facility] calling regarding patient. He is in the office with active cellulitis and the last office note from Dr Linus Salmons states he would need to be seen in ID office if this occurred.   He is currently on antibiotics and the episode has not worsened since hospital follow up.  His next visit with Dr Linus Salmons is June 03, 2016.    Carola Rhine PA would like advise from Dr Linus Salmons if work in visit is needed.    Please advise.

## 2016-05-12 NOTE — Telephone Encounter (Signed)
I will be in the hospital for the next 2 weeks if someone else is available to see him. thanks

## 2016-05-13 NOTE — Telephone Encounter (Signed)
First clinic doctor vacancy is 3/20.

## 2016-05-15 NOTE — Telephone Encounter (Signed)
I am not sure what we can do.  I am not in and others are out of town.  Caren Griffins looks quit busy.  Will just have to see him next time he gets cellulitis.

## 2016-06-03 ENCOUNTER — Ambulatory Visit (INDEPENDENT_AMBULATORY_CARE_PROVIDER_SITE_OTHER): Payer: Medicare Other | Admitting: Internal Medicine

## 2016-06-03 ENCOUNTER — Encounter: Payer: Self-pay | Admitting: Internal Medicine

## 2016-06-03 DIAGNOSIS — B351 Tinea unguium: Secondary | ICD-10-CM | POA: Diagnosis not present

## 2016-06-03 DIAGNOSIS — I872 Venous insufficiency (chronic) (peripheral): Secondary | ICD-10-CM | POA: Diagnosis not present

## 2016-06-04 NOTE — Progress Notes (Signed)
   Subjective:    Patient ID: Thomas Ramos, male    DOB: Jan 30, 1971, 46 y.o.   MRN: [id number]  HPI Here for follow up for possible recurrent cellulitis.  He was recently again in the ED x 2, once for concerns of hypertension and the second time with concern for cellulitis. At the first ED visit, he asked for and received clindamycin for possible cellulitis and was noted to have some mild warmth and redness on left leg.  He went back and noted the same warmth and redness and changed to doxycyline, though really unchanged.  He was afebrile, WBC was wnl.  He completed doxycycline and is currently off of antibiotics.  No current fever, chills.     Review of Systems  Constitutional: Negative for chills and fever.  Gastrointestinal: Negative for diarrhea.  Skin: Negative for rash.       Objective:   Physical Exam  Constitutional: He appears well-nourished.  Musculoskeletal:  Left leg with pitting edema about 1-2+, some redness, no warmth   Skin: No rash noted.          Assessment & Plan:

## 2016-06-04 NOTE — Assessment & Plan Note (Addendum)
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  I have counseled him on the differences between infection and insufficiency, the appearance, the symptoms and I have advised him that antibiotics are not indicated for these recurrences.  He will try to come back again when it is concerning but I do not feel he needs urgent evaluation in the ED for these.   25 minutes spent including 15 minutes of face to face counseling on above + review of the ED records.

## 2016-06-04 NOTE — Assessment & Plan Note (Signed)
He completed about 2 months of therapy.

## 2016-06-11 ENCOUNTER — Emergency Department (HOSPITAL_COMMUNITY): Payer: Medicare Other

## 2016-06-11 ENCOUNTER — Emergency Department (HOSPITAL_COMMUNITY)
Admission: EM | Admit: 2016-06-11 | Discharge: 2016-06-11 | Disposition: A | Discharge status: Home / Self Care | Payer: Medicare Other | Attending: Emergency Medicine | Admitting: Emergency Medicine

## 2016-06-11 DIAGNOSIS — K219 Gastro-esophageal reflux disease without esophagitis: Secondary | ICD-10-CM | POA: Diagnosis not present

## 2016-06-11 DIAGNOSIS — F430 Acute stress reaction: Secondary | ICD-10-CM | POA: Diagnosis not present

## 2016-06-11 DIAGNOSIS — E119 Type 2 diabetes mellitus without complications: Secondary | ICD-10-CM | POA: Insufficient documentation

## 2016-06-11 DIAGNOSIS — I110 Hypertensive heart disease with heart failure: Secondary | ICD-10-CM | POA: Insufficient documentation

## 2016-06-11 DIAGNOSIS — I5032 Chronic diastolic (congestive) heart failure: Secondary | ICD-10-CM | POA: Diagnosis not present

## 2016-06-11 DIAGNOSIS — R079 Chest pain, unspecified: Secondary | ICD-10-CM | POA: Diagnosis present

## 2016-06-11 DIAGNOSIS — F439 Reaction to severe stress, unspecified: Secondary | ICD-10-CM

## 2016-06-11 DIAGNOSIS — Z87891 Personal history of nicotine dependence: Secondary | ICD-10-CM | POA: Diagnosis not present

## 2016-06-11 DIAGNOSIS — Z79899 Other long term (current) drug therapy: Secondary | ICD-10-CM | POA: Diagnosis not present

## 2016-06-11 LAB — BASIC METABOLIC PANEL
ANION GAP: 10 (ref 5–15)
BUN: 12 mg/dL (ref 6–20)
CALCIUM: 9.5 mg/dL (ref 8.9–10.3)
CO2: 26 mmol/L (ref 22–32)
Chloride: 101 mmol/L (ref 101–111)
Creatinine, Ser: 0.67 mg/dL (ref 0.61–1.24)
GLUCOSE: 111 mg/dL — AB (ref 65–99)
POTASSIUM: 3.8 mmol/L (ref 3.5–5.1)
SODIUM: 137 mmol/L (ref 135–145)

## 2016-06-11 LAB — CBC WITH DIFFERENTIAL/PLATELET
BASOS ABS: 0 10*3/uL (ref 0.0–0.1)
BASOS PCT: 0 %
EOS ABS: 0.1 10*3/uL (ref 0.0–0.7)
EOS PCT: 1 %
HCT: 41.2 % (ref 39.0–52.0)
Hemoglobin: 13.7 g/dL (ref 13.0–17.0)
LYMPHS PCT: 27 %
Lymphs Abs: 1.9 10*3/uL (ref 0.7–4.0)
MCH: 26 pg (ref 26.0–34.0)
MCHC: 33.3 g/dL (ref 30.0–36.0)
MCV: 78.3 fL (ref 78.0–100.0)
MONO ABS: 0.5 10*3/uL (ref 0.1–1.0)
Monocytes Relative: 8 %
Neutro Abs: 4.6 10*3/uL (ref 1.7–7.7)
Neutrophils Relative %: 64 %
PLATELETS: 232 10*3/uL (ref 150–400)
RBC: 5.26 MIL/uL (ref 4.22–5.81)
RDW: 13.4 % (ref 11.5–15.5)
WBC: 7.2 10*3/uL (ref 4.0–10.5)

## 2016-06-11 LAB — D-DIMER, QUANTITATIVE (NOT AT ARMC): D DIMER QUANT: 0.44 ug{FEU}/mL (ref 0.00–0.50)

## 2016-06-11 LAB — I-STAT TROPONIN, ED: TROPONIN I, POC: 0 ng/mL (ref 0.00–0.08)

## 2016-06-11 MED ORDER — GI COCKTAIL ~~LOC~~
30.0000 mL | Freq: Once | ORAL | Status: AC
  Administered 2016-06-11: 30 mL via ORAL
  Filled 2016-06-11: qty 30

## 2016-06-11 MED ORDER — ASPIRIN 81 MG PO CHEW
324.0000 mg | CHEWABLE_TABLET | Freq: Once | ORAL | Status: AC
  Administered 2016-06-11: 324 mg via ORAL
  Filled 2016-06-11: qty 4

## 2016-06-11 NOTE — ED Provider Notes (Signed)
[facility] DEPT Provider Note   CSN: [id number] Arrival date & time: 06/11/16  1230     History   Chief Complaint Chief Complaint  Patient presents with  . Chest Pain    HPI Thomas Ramos is a 46 y.o. male with a PMHx of cerebral palsy, DM2, GERD, esophageal stricture, anxiety, chronic venous insufficiency, HTN, dCHF, and quadriplegic spinal paralysis, who presents to the ED with complaints of left-sided chest pain that began around 6 AM after he awoke from rest. He describes the pain as 8/10 intermittent aching nonradiating left-sided chest pain no known ring factors, unchanged with inspiration or movement, and with no treatments tried prior to arrival. He reports that his chest pain is related to being stressed, and that this "has happened before". He is a quadriplegic and therefore is immobile and does not ambulate at baseline. He denies diaphoresis, lightheadedness, fevers, chills, cough, SOB, LE swelling, recent travel/surgery, personal/family hx of DVT/PE, abd pain, N/V/D/C, hematuria, dysuria, leg pain, myalgias, arthralgias, orthopnea, numbness, tingling, focal weakness, or any other complaints at this time. He is a non-smoker. Denies known FHx of cardiac disease. His PCP is Mattie Marlin at [facility].   The history is provided by medical records and the patient. No language interpreter was used.  Chest Pain   This is a recurrent problem. The current episode started 6 to 12 hours ago. The problem occurs hourly. The problem has not changed since onset.The pain is associated with rest and an emotional upset. The pain is present in the lateral region. The pain is at a severity of 8/10. The pain is moderate. Quality: aching. The pain does not radiate. Duration of episode(s) is 7 hours. Exacerbated by: nothing. Pertinent negatives include no abdominal pain, no cough, no diaphoresis, no fever, no leg pain, no lower extremity edema, no nausea, no numbness, no orthopnea, no shortness of breath,  no vomiting and no weakness. He has tried nothing for the symptoms. The treatment provided no relief. Risk factors include male gender, sedentary lifestyle and stress.  His past medical history is significant for CHF, diabetes and hypertension.  Pertinent negatives for past medical history include no DVT and no PE.  Pertinent negatives for family medical history include: no early MI and no PE.    Past Medical History:  Diagnosis Date  . Anxiety   . Cerebral palsy ([place])   . Chronic diastolic (congestive) heart failure   . CP (cerebral palsy) ([place])   . Diabetes mellitus without complication (HCC)    borderline  . Environmental allergies    takes inhalers if needed  . Esophageal stricture   . GERD (gastroesophageal reflux disease)   . Hypertension   . Motility disorder, esophageal   . Pneumonia   . 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[place])   . S/P Botox injection    approx every 4 months  . Seasonal allergies     Patient Active Problem List   Diagnosis Date Noted  . Onychomycosis 02/20/2016  . Essential hypertension   . OSA (obstructive sleep apnea) 12/11/2015  . Chronic diastolic (congestive) heart failure   . Chronic venous insufficiency   . Depression with anxiety 09/20/2015  . Hypersomnia 07/16/2015  . Asthma 07/16/2015  . GERD (gastroesophageal reflux disease) 07/16/2015  . Hematemesis with nausea   . Dysphagia 09/16/2010  . 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 BOTOX INJECTION N/A 12/21/2012   Procedure: BOTOX INJECTION;  Surgeon: Inda Castle,  MD;  Location: [facility];  Service: Endoscopy;  Laterality: N/A;  . BOTOX INJECTION N/A 06/28/2014   Procedure: BOTOX INJECTION;  Surgeon: Inda Castle, MD;  Location: [facility];  Service: Endoscopy;  Laterality: N/A;  . ESOPHAGOGASTRODUODENOSCOPY N/A 12/21/2012   Procedure: ESOPHAGOGASTRODUODENOSCOPY (EGD);  Surgeon: Inda Castle, MD;  Location: [facility];  Service: Endoscopy;  Laterality: N/A;  . ESOPHAGOGASTRODUODENOSCOPY N/A 06/28/2014   Procedure: ESOPHAGOGASTRODUODENOSCOPY (EGD);  Surgeon: Inda Castle, MD;  Location: [facility];  Service: Endoscopy;  Laterality: N/A;  . ESOPHAGUS SURGERY     stretched esophagus  . legs    . MOUTH SURGERY    . TENDON RELEASE         Home Medications    Prior to Admission medications   Medication Sig Start Date End Date Taking? Authorizing Provider  acidophilus (RISAQUAD) CAPS capsule Take 1 capsule by mouth daily.    Historical Provider, MD  albuterol (PROVENTIL HFA;VENTOLIN HFA) 108 (90 Base) MCG/ACT inhaler Inhale 1-2 puffs into the lungs every 6 (six) hours as needed for wheezing or shortness of breath. 08/26/15   Ripley Fraise, MD  amLODipine (NORVASC) 10 MG tablet Take 10 mg by mouth daily.    Historical Provider, MD  baclofen (LIORESAL) 10 MG tablet Take 10 mg by mouth 3 (three) times daily as needed for muscle spasms.     Historical Provider, MD  busPIRone (BUSPAR) 7.5 MG tablet Take 7.5 mg by mouth 2 (two) times daily as needed (for anxiety).     Historical Provider, MD  cetirizine (ZYRTEC) 10 MG tablet Take 10 mg by mouth daily.     Historical Provider, MD  citalopram (CELEXA) 10 MG tablet Take 10 mg by mouth daily.    Historical Provider, MD  clindamycin (CLEOCIN) 150 MG capsule Take 1 capsule (150 mg total) by mouth every 6 (six) hours. Patient not taking: Reported on 06/03/2016 04/23/16   Courteney Lyn Mackuen, MD  doxycycline (VIBRAMYCIN) 100 MG capsule Take 1 capsule (100 mg total) by mouth 2 (two) times daily. Patient not taking: Reported on 06/03/2016 05/01/16   Charlesetta Shanks, MD  fluticasone [facility]) 50 MCG/ACT nasal spray Place 1 spray into both nostrils daily as needed for rhinitis.     Historical Provider, MD  HYDROcodone-acetaminophen (NORCO/VICODIN) 5-325 MG tablet Take 1 tablet by mouth every 6 (six) hours as needed for moderate pain. 11/13/15   Jola Schmidt, MD    montelukast (SINGULAIR) 10 MG tablet Take 10 mg by mouth at bedtime.     Historical Provider, MD  mupirocin cream (BACTROBAN) 2 % Apply 1 application topically 2 (two) times daily. 01/31/16   April Palumbo, MD  nitroGLYCERIN (NITROSTAT) 0.4 MG SL tablet Place 0.4 mg under the tongue every 5 (five) minutes as needed for chest pain.     Historical Provider, MD  ranitidine (ZANTAC) 150 MG tablet Take 150 mg by mouth 2 (two) times daily.     Historical Provider, MD  rosuvastatin (CRESTOR) 5 MG tablet Take 5 mg by mouth at bedtime.    Historical Provider, MD  valsartan (DIOVAN) 80 MG tablet Take 80 mg by mouth daily.    Historical Provider, MD    Family History Family History  Problem Relation Age of Onset  . Hypertension Father   . Lung cancer Father   . Diabetes Mother     Social History Social History  Substance Use Topics  . Smoking status: Former Research scientist (life sciences)  . Smokeless tobacco: Former Systems developer  Comment: smoked one time  . Alcohol use 0.0 oz/week     Comment: 2 beers every other Friday and Saturday     Allergies   Sulfamethoxazole-trimethoprim; Metronidazole; and Penicillins   Review of Systems Review of Systems  Constitutional: Negative for chills, diaphoresis and fever.  Respiratory: Negative for cough and shortness of breath.   Cardiovascular: Positive for chest pain. Negative for orthopnea and leg swelling.  Gastrointestinal: Negative for abdominal pain, constipation, diarrhea, nausea and vomiting.  Genitourinary: Negative for dysuria and hematuria.  Musculoskeletal: Negative for arthralgias and myalgias.  Skin: Negative for color change.  Allergic/Immunologic: Positive for immunocompromised state (DM2).  Neurological: Negative for weakness, light-headedness and numbness.  Psychiatric/Behavioral: Negative for confusion.   10 Systems reviewed and are negative for acute change except as noted in the HPI.   Physical Exam Updated Vital Signs BP (!) 93/53 (BP Location:  Right Arm)   Pulse 94   Temp 98.1 F (36.7 C) (Oral)   Resp 16   SpO2 96%   Physical Exam  Constitutional: He is oriented to person, place, and time. Vital signs are normal. He appears well-developed and well-nourished.  Non-toxic appearance. No distress.  Afebrile, nontoxic, NAD, legs and arms with spastic contractility  HENT:  Head: Normocephalic and atraumatic.  Mouth/Throat: Oropharynx is clear and moist and mucous membranes are normal.  Eyes: Conjunctivae and EOM are normal. Right eye exhibits no discharge. Left eye exhibits no discharge.  Neck: Normal range of motion. Neck supple.  Cardiovascular: Normal rate, regular rhythm, normal heart sounds and intact distal pulses.  Exam reveals no gallop and no friction rub.   No murmur heard. RRR, nl s1/s2, no m/r/g, distal pulses intact, trace b/l pedal edema   Pulmonary/Chest: Effort normal and breath sounds normal. No respiratory distress. He has no decreased breath sounds. He has no wheezes. He has no rhonchi. He has no rales. He exhibits tenderness. He exhibits no crepitus, no deformity and no retraction.  CTAB in all lung fields, no w/r/r, no hypoxia or increased WOB, speaking in full sentences, SpO2 96% on RA Chest wall with mild TTP over L anterior chest, without crepitus, deformities, or retractions   Abdominal: Soft. Normal appearance and bowel sounds are normal. He exhibits no distension. There is no tenderness. There is no rigidity, no rebound, no guarding, no CVA tenderness, no tenderness at McBurney's point and negative Murphy's sign.  Musculoskeletal: Normal range of motion.  Spastic contractures of all extremities, at baseline Strength and sensation grossly intact in all extremities Distal pulses intact Trace b/l pedal edema at baseline, neg homan's bilaterally   Neurological: He is alert and oriented to person, place, and time. He has normal strength. No sensory deficit.  Skin: Skin is warm, dry and intact. No rash noted.    Psychiatric: He has a normal mood and affect.  Nursing note and vitals reviewed.    ED Treatments / Results  Labs (all labs ordered are listed, but only abnormal results are displayed) Labs Reviewed  BASIC METABOLIC PANEL - Abnormal; Notable for the following:       Result Value   Glucose, Bld 111 (*)    All other components within normal limits  CBC WITH DIFFERENTIAL/PLATELET  D-DIMER, QUANTITATIVE (NOT AT [facility])  Randolm Idol, ED    EKG  EKG Interpretation  Date/Time:  Thursday June 11 2016 12:56:34 EDT Ventricular Rate:  98 PR Interval:  138 QRS Duration: 90 QT Interval:  352 QTC Calculation: 449 R Axis:  109 Text Interpretation:  Normal sinus rhythm Rightward axis Borderline ECG Confirmed by ZAMMIT  MD, JOSEPH [phone]) on 06/11/2016 2:04:02 PM       Radiology Dg Chest 2 View  Result Date: 06/11/2016 CLINICAL DATA:  Chest pain.  Cerebral palsy EXAM: CHEST  2 VIEW COMPARISON:  March 21, 2016 FINDINGS: There is stable elevation of the right hemidiaphragm. There is no edema or consolidation. Heart size and pulmonary vascularity are within normal limits. No adenopathy. There is degenerative change in the lower thoracic and upper lumbar spine regions. IMPRESSION: Stable elevation of right hemidiaphragm.  No edema or consolidation. Electronically Signed   By: Lowella Grip III M.D.   On: 06/11/2016 14:08    Procedures Procedures (including critical care time)  Medications Ordered in ED Medications  aspirin chewable tablet 324 mg (324 mg Oral Given 06/11/16 1441)  gi cocktail (Maalox,Lidocaine,Donnatal) (30 mLs Oral Given 06/11/16 1442)     Initial Impression / Assessment and Plan / ED Course  I have reviewed the triage vital signs and the nursing notes.  Pertinent labs & imaging results that were available during my care of the patient were reviewed by me and considered in my medical decision making (see chart for details).     46 y.o. male here with L CP  onset this morning, states it's because he's stressed. 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 Denies pain with inspiration or SOB, however due to his immobility, he is at a slight risk for PE, despite it being unlikely given his complaints/vitals/etc. EKG nonischemic, no acute findings, and overall unremarkable. Will get labs including D-dimer, and CXR. Will give ASA and GI cocktail then reassess shortly  4:24 PM CBC w/diff unremarkable. BMP unremarkable. Trop negative at [facility]. Dimer negative. CXR with chronic elevation of R hemidiaphragm, unchanged, without acute findings. Pt feeling better. Pt with low HEART score, pain likely either musculoskeletal vs gastritis/GERD related, and stress likely playing a part as well. Pt states he's on zantac, encouraged him to continue that, use tums/maalox/tylenol additionally for pain, GERD diet and lifestyle modifications advised, and f/up with PCP in 1wk for recheck. Advised staying hydrated. I explained the diagnosis and have given explicit precautions to return to the ER including for any other new or worsening symptoms. The patient understands and accepts the medical plan as it's been dictated and I have answered their questions. Discharge instructions concerning home care and prescriptions have been given. The patient is STABLE and is discharged to home in good condition.   Final Clinical Impressions(s) / ED Diagnoses   Final diagnoses:  Left sided chest pain  Stress  Gastroesophageal reflux disease, esophagitis presence not specified    New Prescriptions New Prescriptions   No medications on file     [address], PA-C 06/11/16 1625    Milton Ferguson, MD 06/11/16 1627

## 2016-06-11 NOTE — ED Triage Notes (Signed)
Chest wall pain that started this AM,Pain on inspiration, generalized pain, no radiation and no associated symptoms. States he has been under a lot of stress and this has happened before.

## 2016-06-11 NOTE — ED Notes (Signed)
Philippa Chester to come get lab work.

## 2016-06-11 NOTE — ED Notes (Signed)
Pt is in stable condition upon d/c and is escorted from ED via [facility].

## 2016-06-11 NOTE — Discharge Instructions (Signed)
Your labs, xray, and EKG are all reassuring, your chest pain could be a variety of things including indigestion, gas pain, stomach ulcers, stress-related pain, muscle pain, and other nonemergent issues. You will need to continue taking your home zantac as directed, and avoid spicy/fatty/acidic foods, avoid soda/coffee/tea/alcohol. Avoid laying down flat within 30 minutes of eating. Avoid NSAIDs like ibuprofen/aleve/motrin/etc on an empty stomach. May consider using over the counter tums/maalox as needed for additional relief. Use tylenol as needed for pain. You may consider using heat to the areas of pain, no more than 20 minutes every hour. Follow up with your regular doctor in 1 week for recheck of symptoms. Return to the ER for changes or worsening symptoms.  SEEK IMMEDIATE MEDICAL ATTENTION IF: You develop a fever.  Your chest pains become severe or intolerable.  You develop new, unexplained symptoms (problems).  You develop shortness of breath, nausea, vomiting, sweating or feel light headed.  You develop a new cough or you cough up blood. You develop new leg swelling

## 2016-06-11 NOTE — ED Notes (Signed)
[facility] called for pt 

## 2016-06-16 ENCOUNTER — Emergency Department (HOSPITAL_COMMUNITY)
Admission: EM | Admit: 2016-06-16 | Discharge: 2016-06-16 | Disposition: A | Discharge status: Home / Self Care | Payer: Medicare Other | Attending: Emergency Medicine | Admitting: Emergency Medicine

## 2016-06-16 ENCOUNTER — Emergency Department (HOSPITAL_COMMUNITY): Payer: Medicare Other

## 2016-06-16 ENCOUNTER — Encounter (HOSPITAL_COMMUNITY): Payer: Self-pay | Admitting: Nurse Practitioner

## 2016-06-16 DIAGNOSIS — M25511 Pain in right shoulder: Secondary | ICD-10-CM | POA: Insufficient documentation

## 2016-06-16 DIAGNOSIS — Z87891 Personal history of nicotine dependence: Secondary | ICD-10-CM | POA: Diagnosis not present

## 2016-06-16 DIAGNOSIS — S299XXA Unspecified injury of thorax, initial encounter: Secondary | ICD-10-CM | POA: Diagnosis present

## 2016-06-16 DIAGNOSIS — I5032 Chronic diastolic (congestive) heart failure: Secondary | ICD-10-CM | POA: Diagnosis not present

## 2016-06-16 DIAGNOSIS — S2231XA Fracture of one rib, right side, initial encounter for closed fracture: Secondary | ICD-10-CM | POA: Insufficient documentation

## 2016-06-16 DIAGNOSIS — Y999 Unspecified external cause status: Secondary | ICD-10-CM | POA: Insufficient documentation

## 2016-06-16 DIAGNOSIS — I110 Hypertensive heart disease with heart failure: Secondary | ICD-10-CM | POA: Diagnosis not present

## 2016-06-16 DIAGNOSIS — Y939 Activity, unspecified: Secondary | ICD-10-CM | POA: Diagnosis not present

## 2016-06-16 DIAGNOSIS — Y929 Unspecified place or not applicable: Secondary | ICD-10-CM | POA: Diagnosis not present

## 2016-06-16 DIAGNOSIS — Z79899 Other long term (current) drug therapy: Secondary | ICD-10-CM | POA: Insufficient documentation

## 2016-06-16 DIAGNOSIS — J45909 Unspecified asthma, uncomplicated: Secondary | ICD-10-CM | POA: Diagnosis not present

## 2016-06-16 NOTE — ED Notes (Signed)
Bed: UG64 Expected date:  Expected time:  Means of arrival:  Comments: EMS-fall/back pain

## 2016-06-16 NOTE — ED Notes (Signed)
Bed: WHALD Expected date:  Expected time:  Means of arrival:  Comments: 

## 2016-06-16 NOTE — ED Notes (Signed)
[facility] here to pick up pt.. 

## 2016-06-16 NOTE — ED Provider Notes (Signed)
[facility] DEPT Provider Note   CSN: [id number] Arrival date & time: 06/16/16  1530     History   Chief Complaint Chief Complaint  Patient presents with  . Fall    in wheelchair    HPI Thomas Ramos is a 46 y.o. male.  46 y/o male w/ h/o CP presents w/ dull right shoulder pain that started after he had a mechanical fall in his wheelchair Pt was strapped to his chair and tumbled over No loc, denies head injury c/o right shoulder pain that is sharp and non radiating Pt has baseline limited use of his right UE Denies neck or new back pain No pain below the waist Ems called and pt transported here--no tx given PTA      Past Medical History:  Diagnosis Date  . Anxiety   . Cerebral palsy ([place])   . Chronic diastolic (congestive) heart failure   . CP (cerebral palsy) ([place])   . Diabetes mellitus without complication (HCC)    borderline  . Environmental allergies    takes inhalers if needed  . Esophageal stricture   . GERD (gastroesophageal reflux disease)   . Hypertension   . Motility disorder, esophageal   . Pneumonia   . 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[place])   . S/P Botox injection    approx every 4 months  . Seasonal allergies     Patient Active Problem List   Diagnosis Date Noted  . Onychomycosis 02/20/2016  . Essential hypertension   . OSA (obstructive sleep apnea) 12/11/2015  . Chronic diastolic (congestive) heart failure   . Chronic venous insufficiency   . Depression with anxiety 09/20/2015  . Hypersomnia 07/16/2015  . Asthma 07/16/2015  . GERD (gastroesophageal reflux disease) 07/16/2015  . Hematemesis with nausea   . Dysphagia 09/16/2010  . 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 BOTOX INJECTION N/A 12/21/2012   Procedure: BOTOX INJECTION;  Surgeon: Inda Castle, MD;  Location: [facility];  Service: Endoscopy;  Laterality: N/A;  . BOTOX INJECTION N/A 06/28/2014   Procedure: BOTOX  INJECTION;  Surgeon: Inda Castle, MD;  Location: [facility];  Service: Endoscopy;  Laterality: N/A;  . ESOPHAGOGASTRODUODENOSCOPY N/A 12/21/2012   Procedure: ESOPHAGOGASTRODUODENOSCOPY (EGD);  Surgeon: Inda Castle, MD;  Location: [facility];  Service: Endoscopy;  Laterality: N/A;  . ESOPHAGOGASTRODUODENOSCOPY N/A 06/28/2014   Procedure: ESOPHAGOGASTRODUODENOSCOPY (EGD);  Surgeon: Inda Castle, MD;  Location: [facility];  Service: Endoscopy;  Laterality: N/A;  . ESOPHAGUS SURGERY     stretched esophagus  . legs    . MOUTH SURGERY    . TENDON RELEASE         Home Medications    Prior to Admission medications   Medication Sig Start Date End Date Taking? Authorizing Provider  acidophilus (RISAQUAD) CAPS capsule Take 1 capsule by mouth daily.    Historical Provider, MD  albuterol (PROVENTIL HFA;VENTOLIN HFA) 108 (90 Base) MCG/ACT inhaler Inhale 1-2 puffs into the lungs every 6 (six) hours as needed for wheezing or shortness of breath. 08/26/15   Ripley Fraise, MD  amLODipine (NORVASC) 10 MG tablet Take 10 mg by mouth daily.    Historical Provider, MD  baclofen (LIORESAL) 10 MG tablet Take 10 mg by mouth 3 (three) times daily as needed for muscle spasms.     Historical Provider, MD  busPIRone (BUSPAR) 7.5 MG tablet Take 7.5 mg by mouth 2 (two) times daily as needed (for anxiety).  Historical Provider, MD  cetirizine (ZYRTEC) 10 MG tablet Take 10 mg by mouth daily.     Historical Provider, MD  citalopram (CELEXA) 10 MG tablet Take 10 mg by mouth daily.    Historical Provider, MD  clindamycin (CLEOCIN) 150 MG capsule Take 1 capsule (150 mg total) by mouth every 6 (six) hours. Patient not taking: Reported on 06/03/2016 04/23/16   Courteney Lyn Mackuen, MD  doxycycline (VIBRAMYCIN) 100 MG capsule Take 1 capsule (100 mg total) by mouth 2 (two) times daily. Patient not taking: Reported on 06/03/2016 05/01/16   Charlesetta Shanks, MD  fluticasone [facility]) 50 MCG/ACT nasal spray Place 1  spray into both nostrils daily as needed for rhinitis.     Historical Provider, MD  HYDROcodone-acetaminophen (NORCO/VICODIN) 5-325 MG tablet Take 1 tablet by mouth every 6 (six) hours as needed for moderate pain. 11/13/15   Jola Schmidt, MD  montelukast (SINGULAIR) 10 MG tablet Take 10 mg by mouth at bedtime.     Historical Provider, MD  mupirocin cream (BACTROBAN) 2 % Apply 1 application topically 2 (two) times daily. 01/31/16   April Palumbo, MD  nitroGLYCERIN (NITROSTAT) 0.4 MG SL tablet Place 0.4 mg under the tongue every 5 (five) minutes as needed for chest pain.     Historical Provider, MD  ranitidine (ZANTAC) 150 MG tablet Take 150 mg by mouth 2 (two) times daily.     Historical Provider, MD  rosuvastatin (CRESTOR) 5 MG tablet Take 5 mg by mouth at bedtime.    Historical Provider, MD  valsartan (DIOVAN) 80 MG tablet Take 80 mg by mouth daily.    Historical Provider, MD    Family History Family History  Problem Relation Age of Onset  . Hypertension Father   . Lung cancer Father   . Diabetes Mother     Social History Social History  Substance Use Topics  . Smoking status: Former Research scientist (life sciences)  . Smokeless tobacco: Former Systems developer     Comment: smoked one time  . Alcohol use 0.0 oz/week     Comment: 2 beers every other Friday and Saturday     Allergies   Sulfamethoxazole-trimethoprim; Metronidazole; and Penicillins   Review of Systems Review of Systems  All other systems reviewed and are negative.    Physical Exam Updated Vital Signs BP 106/74   Pulse 88   Resp 18   Physical Exam  Constitutional: He is oriented to person, place, and time. He appears well-developed and well-nourished.  Non-toxic appearance. No distress.  HENT:  Head: Normocephalic and atraumatic.  Eyes: Conjunctivae, EOM and lids are normal. Pupils are equal, round, and reactive to light.  Neck: Normal range of motion. Neck supple. No tracheal deviation present. No thyroid mass present.  Cardiovascular:  Normal rate, regular rhythm and normal heart sounds.  Exam reveals no gallop.   No murmur heard. Pulmonary/Chest: Effort normal and breath sounds normal. No stridor. No respiratory distress. He has no decreased breath sounds. He has no wheezes. He has no rhonchi. He has no rales.    Abdominal: Soft. Normal appearance and bowel sounds are normal. He exhibits no distension. There is no tenderness. There is no rebound and no CVA tenderness.  Musculoskeletal: He exhibits no edema.       Right shoulder: He exhibits decreased range of motion and tenderness. He exhibits no swelling, no deformity and no laceration.  Flexion contractures noted in upper and lower extrem which are baseline for his CP  Neurological: He is alert and oriented to  person, place, and time. He displays atrophy. No cranial nerve deficit or sensory deficit. GCS eye subscore is 4. GCS verbal subscore is 5. GCS motor subscore is 6.  At baseline per pt  Skin: Skin is warm and dry. No abrasion and no rash noted.  Psychiatric: He has a normal mood and affect. His speech is normal and behavior is normal.  Nursing note and vitals reviewed.    ED Treatments / Results  Labs (all labs ordered are listed, but only abnormal results are displayed) Labs Reviewed - No data to display  EKG  EKG Interpretation None       Radiology No results found.  Procedures Procedures (including critical care time)  Medications Ordered in ED Medications - No data to display   Initial Impression / Assessment and Plan / ED Course  I have reviewed the triage vital signs and the nursing notes.  Pertinent labs & imaging results that were available during my care of the patient were reviewed by me and considered in my medical decision making (see chart for details).     Shoulder x-ray negative. Rib films does show a incomplete fracture of the seventh rib. Patient discharged home.  Final Clinical Impressions(s) / ED Diagnoses   Final  diagnoses:  None    New Prescriptions New Prescriptions   No medications on file     Lacretia Leigh, MD 06/16/16 1816

## 2016-06-16 NOTE — ED Notes (Signed)
[facility]ar called for pick up 

## 2016-06-16 NOTE — ED Triage Notes (Signed)
Pt presents to [facility] today after he turned his electric wheelchair over while crossing a curb. He describes the point of impact as the right shoulder. Denies other new pain, complains of chronic back pain. Denies loss of consciousness. Patient was restrained in wheelchair. EMS removed from wheelchair and transferred to stretcher.

## 2016-08-15 ENCOUNTER — Inpatient Hospital Stay (HOSPITAL_COMMUNITY): Payer: Medicare Other

## 2016-08-15 ENCOUNTER — Inpatient Hospital Stay (HOSPITAL_COMMUNITY)
Admission: EM | Admit: 2016-08-15 | Discharge: 2016-08-18 | DRG: 194 | Disposition: A | Discharge status: Home / Self Care | Payer: Medicare Other | Attending: Internal Medicine | Admitting: Internal Medicine

## 2016-08-15 ENCOUNTER — Encounter (HOSPITAL_COMMUNITY): Payer: Self-pay

## 2016-08-15 ENCOUNTER — Emergency Department (HOSPITAL_COMMUNITY): Payer: Medicare Other

## 2016-08-15 DIAGNOSIS — B9789 Other viral agents as the cause of diseases classified elsewhere: Secondary | ICD-10-CM | POA: Diagnosis present

## 2016-08-15 DIAGNOSIS — R6510 Systemic inflammatory response syndrome (SIRS) of non-infectious origin without acute organ dysfunction: Secondary | ICD-10-CM | POA: Diagnosis not present

## 2016-08-15 DIAGNOSIS — J069 Acute upper respiratory infection, unspecified: Secondary | ICD-10-CM | POA: Diagnosis present

## 2016-08-15 DIAGNOSIS — Z801 Family history of malignant neoplasm of trachea, bronchus and lung: Secondary | ICD-10-CM | POA: Diagnosis not present

## 2016-08-15 DIAGNOSIS — B348 Other viral infections of unspecified site: Secondary | ICD-10-CM | POA: Diagnosis not present

## 2016-08-15 DIAGNOSIS — L8990 Pressure ulcer of unspecified site, unspecified stage: Secondary | ICD-10-CM | POA: Diagnosis present

## 2016-08-15 DIAGNOSIS — F418 Other specified anxiety disorders: Secondary | ICD-10-CM | POA: Diagnosis present

## 2016-08-15 DIAGNOSIS — R0603 Acute respiratory distress: Secondary | ICD-10-CM

## 2016-08-15 DIAGNOSIS — R000 Tachycardia, unspecified: Secondary | ICD-10-CM

## 2016-08-15 DIAGNOSIS — R740 Nonspecific elevation of levels of transaminase and lactic acid dehydrogenase [LDH]: Secondary | ICD-10-CM | POA: Diagnosis present

## 2016-08-15 DIAGNOSIS — Z79899 Other long term (current) drug therapy: Secondary | ICD-10-CM

## 2016-08-15 DIAGNOSIS — I10 Essential (primary) hypertension: Secondary | ICD-10-CM

## 2016-08-15 DIAGNOSIS — E119 Type 2 diabetes mellitus without complications: Secondary | ICD-10-CM | POA: Diagnosis present

## 2016-08-15 DIAGNOSIS — G4733 Obstructive sleep apnea (adult) (pediatric): Secondary | ICD-10-CM | POA: Diagnosis present

## 2016-08-15 DIAGNOSIS — J189 Pneumonia, unspecified organism: Secondary | ICD-10-CM | POA: Diagnosis present

## 2016-08-15 DIAGNOSIS — R0602 Shortness of breath: Secondary | ICD-10-CM

## 2016-08-15 DIAGNOSIS — A419 Sepsis, unspecified organism: Secondary | ICD-10-CM | POA: Diagnosis present

## 2016-08-15 DIAGNOSIS — K224 Dyskinesia of esophagus: Secondary | ICD-10-CM | POA: Diagnosis present

## 2016-08-15 DIAGNOSIS — G809 Cerebral palsy, unspecified: Secondary | ICD-10-CM | POA: Diagnosis present

## 2016-08-15 DIAGNOSIS — Z833 Family history of diabetes mellitus: Secondary | ICD-10-CM

## 2016-08-15 DIAGNOSIS — I110 Hypertensive heart disease with heart failure: Secondary | ICD-10-CM | POA: Diagnosis present

## 2016-08-15 DIAGNOSIS — K219 Gastro-esophageal reflux disease without esophagitis: Secondary | ICD-10-CM | POA: Diagnosis present

## 2016-08-15 DIAGNOSIS — Z8249 Family history of ischemic heart disease and other diseases of the circulatory system: Secondary | ICD-10-CM | POA: Diagnosis not present

## 2016-08-15 DIAGNOSIS — E860 Dehydration: Secondary | ICD-10-CM | POA: Diagnosis present

## 2016-08-15 DIAGNOSIS — I5032 Chronic diastolic (congestive) heart failure: Secondary | ICD-10-CM | POA: Diagnosis present

## 2016-08-15 DIAGNOSIS — T17908A Unspecified foreign body in respiratory tract, part unspecified causing other injury, initial encounter: Secondary | ICD-10-CM

## 2016-08-15 LAB — BASIC METABOLIC PANEL
Anion gap: 11 (ref 5–15)
BUN: 13 mg/dL (ref 6–20)
CO2: 22 mmol/L (ref 22–32)
Calcium: 8.8 mg/dL — ABNORMAL LOW (ref 8.9–10.3)
Chloride: 104 mmol/L (ref 101–111)
Creatinine, Ser: 0.73 mg/dL (ref 0.61–1.24)
GFR calc Af Amer: >60 mL/min (ref >60)
GFR calc non Af Amer: >60 mL/min (ref >60)
Glucose, Bld: 124 mg/dL — ABNORMAL HIGH (ref 65–99)
Potassium: 3.7 mmol/L (ref 3.5–5.1)
Sodium: 137 mmol/L (ref 135–145)

## 2016-08-15 LAB — CBC WITH DIFFERENTIAL/PLATELET
Basophils Absolute: 0 10*3/uL (ref 0.0–0.1)
Basophils Relative: 0 %
Eosinophils Absolute: 0.1 10*3/uL (ref 0.0–0.7)
Eosinophils Relative: 1 %
HCT: 38.6 % — ABNORMAL LOW (ref 39.0–52.0)
Hemoglobin: 12.8 g/dL — ABNORMAL LOW (ref 13.0–17.0)
Lymphocytes Relative: 20 %
Lymphs Abs: 1.4 10*3/uL (ref 0.7–4.0)
MCH: 26.4 pg (ref 26.0–34.0)
MCHC: 33.2 g/dL (ref 30.0–36.0)
MCV: 79.6 fL (ref 78.0–100.0)
Monocytes Absolute: 0.6 10*3/uL (ref 0.1–1.0)
Monocytes Relative: 9 %
Neutro Abs: 4.7 10*3/uL (ref 1.7–7.7)
Neutrophils Relative %: 70 %
Platelets: 191 10*3/uL (ref 150–400)
RBC: 4.85 MIL/uL (ref 4.22–5.81)
RDW: 13.3 % (ref 11.5–15.5)
WBC: 6.7 10*3/uL (ref 4.0–10.5)

## 2016-08-15 LAB — URINALYSIS, ROUTINE W REFLEX MICROSCOPIC
Bilirubin Urine: NEGATIVE
Glucose, UA: NEGATIVE mg/dL
Hgb urine dipstick: NEGATIVE
Ketones, ur: NEGATIVE mg/dL
Leukocytes, UA: NEGATIVE
NITRITE: NEGATIVE
PROTEIN: NEGATIVE mg/dL
SPECIFIC GRAVITY, URINE: 1.014 (ref 1.005–1.030)
pH: 6 (ref 5.0–8.0)

## 2016-08-15 LAB — BRAIN NATRIURETIC PEPTIDE: B NATRIURETIC PEPTIDE 5: 34.7 pg/mL (ref 0.0–100.0)

## 2016-08-15 LAB — TYPE AND SCREEN
ABO/RH(D): O POS
ANTIBODY SCREEN: NEGATIVE

## 2016-08-15 LAB — CORTISOL: CORTISOL PLASMA: 14.2 ug/dL

## 2016-08-15 LAB — MRSA PCR SCREENING: MRSA BY PCR: NEGATIVE

## 2016-08-15 LAB — PROCALCITONIN: Procalcitonin: <0.1 ng/mL

## 2016-08-15 LAB — MAGNESIUM: MAGNESIUM: 1.8 mg/dL (ref 1.7–2.4)

## 2016-08-15 LAB — I-STAT CG4 LACTIC ACID, ED
LACTIC ACID, VENOUS: 2.44 mmol/L — AB (ref 0.5–1.9)
Lactic Acid, Venous: 1.23 mmol/L (ref 0.5–1.9)

## 2016-08-15 LAB — I-STAT ARTERIAL BLOOD GAS, ED
Acid-Base Excess: 1 mmol/L (ref 0.0–2.0)
Bicarbonate: 25 mmol/L (ref 20.0–28.0)
O2 Saturation: 99 %
TCO2: 26 mmol/L (ref 0–100)
pCO2 arterial: 37.2 mmHg (ref 32.0–48.0)
pH, Arterial: 7.435 (ref 7.350–7.450)
pO2, Arterial: 131 mmHg — ABNORMAL HIGH (ref 83.0–108.0)

## 2016-08-15 LAB — PROTIME-INR
INR: 1.13
Prothrombin Time: 14.6 seconds (ref 11.4–15.2)

## 2016-08-15 LAB — APTT: aPTT: 35 seconds (ref 24–36)

## 2016-08-15 LAB — LACTIC ACID, PLASMA
LACTIC ACID, VENOUS: 2.2 mmol/L — AB (ref 0.5–1.9)
Lactic Acid, Venous: 2 mmol/L (ref 0.5–1.9)

## 2016-08-15 MED ORDER — LEVALBUTEROL HCL 0.63 MG/3ML IN NEBU: 0.6300 mg | INHALATION_SOLUTION | RESPIRATORY_TRACT | Status: DC | PRN

## 2016-08-15 MED ORDER — FAMOTIDINE 20 MG PO TABS
20.0000 mg | ORAL_TABLET | Freq: Every day | ORAL | Status: DC
  Administered 2016-08-16 – 2016-08-18 (×3): 20 mg via ORAL
  Filled 2016-08-15 (×3): qty 1

## 2016-08-15 MED ORDER — SODIUM CHLORIDE 0.9 % IV BOLUS (SEPSIS)
1000.0000 mL | Freq: Once | INTRAVENOUS | Status: AC
  Administered 2016-08-15: 1000 mL via INTRAVENOUS

## 2016-08-15 MED ORDER — SODIUM CHLORIDE 0.9 % IV SOLN
INTRAVENOUS | Status: DC
  Administered 2016-08-15 – 2016-08-18 (×6): via INTRAVENOUS

## 2016-08-15 MED ORDER — VANCOMYCIN HCL IN DEXTROSE 1-5 GM/200ML-% IV SOLN: 1000.0000 mg | Freq: Once | INTRAVENOUS | Status: DC

## 2016-08-15 MED ORDER — RISAQUAD PO CAPS
1.0000 | ORAL_CAPSULE | Freq: Every day | ORAL | Status: DC
  Administered 2016-08-16 – 2016-08-18 (×3): 1 via ORAL
  Filled 2016-08-15 (×3): qty 1

## 2016-08-15 MED ORDER — MUPIROCIN CALCIUM 2 % EX CREA
1.0000 "application " | TOPICAL_CREAM | Freq: Two times a day (BID) | CUTANEOUS | Status: DC
  Administered 2016-08-15 – 2016-08-18 (×4): 1 via TOPICAL
  Filled 2016-08-15: qty 15

## 2016-08-15 MED ORDER — SODIUM CHLORIDE 0.9 % IV BOLUS (SEPSIS)
500.0000 mL | Freq: Once | INTRAVENOUS | Status: AC
  Administered 2016-08-15: 500 mL via INTRAVENOUS

## 2016-08-15 MED ORDER — PANTOPRAZOLE SODIUM 40 MG PO TBEC
40.0000 mg | DELAYED_RELEASE_TABLET | Freq: Every day | ORAL | Status: DC
  Administered 2016-08-15 – 2016-08-18 (×4): 40 mg via ORAL
  Filled 2016-08-15 (×4): qty 1

## 2016-08-15 MED ORDER — HYDROCODONE-ACETAMINOPHEN 5-325 MG PO TABS: 1.0000 | ORAL_TABLET | Freq: Four times a day (QID) | ORAL | Status: DC | PRN

## 2016-08-15 MED ORDER — SODIUM CHLORIDE 0.9 % IV SOLN
1500.0000 mg | Freq: Once | INTRAVENOUS | Status: AC
  Administered 2016-08-15: 1500 mg via INTRAVENOUS
  Filled 2016-08-15: qty 1500

## 2016-08-15 MED ORDER — BUDESONIDE 0.25 MG/2ML IN SUSP
0.2500 mg | Freq: Two times a day (BID) | RESPIRATORY_TRACT | Status: DC
  Administered 2016-08-16 – 2016-08-18 (×5): 0.25 mg via RESPIRATORY_TRACT
  Filled 2016-08-15 (×5): qty 2

## 2016-08-15 MED ORDER — ALBUTEROL SULFATE (2.5 MG/3ML) 0.083% IN NEBU
5.0000 mg | INHALATION_SOLUTION | Freq: Once | RESPIRATORY_TRACT | Status: AC
  Administered 2016-08-15: 5 mg via RESPIRATORY_TRACT
  Filled 2016-08-15: qty 6

## 2016-08-15 MED ORDER — BACLOFEN 10 MG PO TABS
10.0000 mg | ORAL_TABLET | Freq: Three times a day (TID) | ORAL | Status: DC | PRN
  Filled 2016-08-15: qty 1

## 2016-08-15 MED ORDER — CITALOPRAM HYDROBROMIDE 10 MG PO TABS
10.0000 mg | ORAL_TABLET | Freq: Every day | ORAL | Status: DC
  Administered 2016-08-16 – 2016-08-18 (×3): 10 mg via ORAL
  Filled 2016-08-15 (×3): qty 1

## 2016-08-15 MED ORDER — IPRATROPIUM BROMIDE 0.02 % IN SOLN: 0.5000 mg | RESPIRATORY_TRACT | Status: DC | PRN

## 2016-08-15 MED ORDER — ROSUVASTATIN CALCIUM 10 MG PO TABS
5.0000 mg | ORAL_TABLET | Freq: Every day | ORAL | Status: DC
  Administered 2016-08-15 – 2016-08-17 (×3): 5 mg via ORAL
  Filled 2016-08-15 (×4): qty 1

## 2016-08-15 MED ORDER — IPRATROPIUM BROMIDE 0.02 % IN SOLN
0.5000 mg | Freq: Four times a day (QID) | RESPIRATORY_TRACT | Status: DC
  Administered 2016-08-15 – 2016-08-16 (×4): 0.5 mg via RESPIRATORY_TRACT
  Filled 2016-08-15 (×4): qty 2.5

## 2016-08-15 MED ORDER — SODIUM CHLORIDE 0.9 % IV BOLUS (SEPSIS)
500.0000 mL | Freq: Once | INTRAVENOUS | Status: AC
Start: 2016-08-15 — End: 2016-08-15
  Administered 2016-08-15: 500 mL via INTRAVENOUS

## 2016-08-15 MED ORDER — BUSPIRONE HCL 15 MG PO TABS
7.5000 mg | ORAL_TABLET | Freq: Two times a day (BID) | ORAL | Status: DC | PRN
  Filled 2016-08-15: qty 1

## 2016-08-15 MED ORDER — LEVALBUTEROL HCL 0.63 MG/3ML IN NEBU
0.6300 mg | INHALATION_SOLUTION | Freq: Four times a day (QID) | RESPIRATORY_TRACT | Status: DC
  Administered 2016-08-15 – 2016-08-16 (×4): 0.63 mg via RESPIRATORY_TRACT
  Filled 2016-08-15 (×4): qty 3

## 2016-08-15 MED ORDER — NITROGLYCERIN 0.4 MG SL SUBL: 0.4000 mg | SUBLINGUAL_TABLET | SUBLINGUAL | Status: DC | PRN

## 2016-08-15 MED ORDER — SODIUM CHLORIDE 0.9 % IV BOLUS (SEPSIS): 250.0000 mL | Freq: Once | INTRAVENOUS | Status: DC

## 2016-08-15 MED ORDER — FLUTICASONE PROPIONATE 50 MCG/ACT NA SUSP
2.0000 | Freq: Every day | NASAL | Status: DC
  Administered 2016-08-15 – 2016-08-18 (×4): 2 via NASAL
  Filled 2016-08-15: qty 16

## 2016-08-15 MED ORDER — SODIUM CHLORIDE 0.9 % IV SOLN: INTRAVENOUS | Status: DC

## 2016-08-15 MED ORDER — FLUTICASONE PROPIONATE 50 MCG/ACT NA SUSP
1.0000 | Freq: Every day | NASAL | Status: DC | PRN
  Filled 2016-08-15: qty 16

## 2016-08-15 MED ORDER — ENOXAPARIN SODIUM 40 MG/0.4ML ~~LOC~~ SOLN
40.0000 mg | SUBCUTANEOUS | Status: DC
  Administered 2016-08-15 – 2016-08-17 (×3): 40 mg via SUBCUTANEOUS
  Filled 2016-08-15 (×5): qty 0.4

## 2016-08-15 MED ORDER — SODIUM CHLORIDE 3 % IN NEBU
4.0000 mL | INHALATION_SOLUTION | Freq: Two times a day (BID) | RESPIRATORY_TRACT | Status: DC
  Administered 2016-08-16 – 2016-08-18 (×4): 4 mL via RESPIRATORY_TRACT
  Filled 2016-08-15 (×5): qty 4

## 2016-08-15 MED ORDER — HYDROCOD POLST-CPM POLST ER 10-8 MG/5ML PO SUER
5.0000 mL | Freq: Once | ORAL | Status: AC
  Administered 2016-08-15: 5 mL via ORAL
  Filled 2016-08-15: qty 5

## 2016-08-15 MED ORDER — VANCOMYCIN HCL IN DEXTROSE 1-5 GM/200ML-% IV SOLN
1000.0000 mg | Freq: Three times a day (TID) | INTRAVENOUS | Status: DC
  Administered 2016-08-16 (×2): 1000 mg via INTRAVENOUS
  Filled 2016-08-15 (×5): qty 200

## 2016-08-15 MED ORDER — ONDANSETRON HCL 4 MG/2ML IJ SOLN
4.0000 mg | Freq: Four times a day (QID) | INTRAMUSCULAR | Status: DC | PRN
  Administered 2016-08-15: 4 mg via INTRAVENOUS
  Filled 2016-08-15: qty 2

## 2016-08-15 MED ORDER — LEVOFLOXACIN IN D5W 750 MG/150ML IV SOLN: 750.0000 mg | Freq: Once | INTRAVENOUS | Status: DC

## 2016-08-15 MED ORDER — METHYLPREDNISOLONE SODIUM SUCC 125 MG IJ SOLR
80.0000 mg | Freq: Four times a day (QID) | INTRAMUSCULAR | Status: DC
  Administered 2016-08-15 – 2016-08-16 (×4): 80 mg via INTRAVENOUS
  Filled 2016-08-15 (×4): qty 2

## 2016-08-15 MED ORDER — GUAIFENESIN ER 600 MG PO TB12
1200.0000 mg | ORAL_TABLET | Freq: Two times a day (BID) | ORAL | Status: DC
  Administered 2016-08-15 – 2016-08-18 (×6): 1200 mg via ORAL
  Filled 2016-08-15 (×6): qty 2

## 2016-08-15 MED ORDER — IOPAMIDOL (ISOVUE-370) INJECTION 76%
INTRAVENOUS | Status: AC
  Administered 2016-08-15: 100 mL
  Filled 2016-08-15: qty 100

## 2016-08-15 MED ORDER — LEVOFLOXACIN IN D5W 750 MG/150ML IV SOLN
750.0000 mg | INTRAVENOUS | Status: DC
  Administered 2016-08-15 – 2016-08-16 (×2): 750 mg via INTRAVENOUS
  Filled 2016-08-15 (×2): qty 150

## 2016-08-15 MED ORDER — MONTELUKAST SODIUM 10 MG PO TABS: 10.0000 mg | ORAL_TABLET | Freq: Every day | ORAL | Status: DC

## 2016-08-15 NOTE — ED Triage Notes (Addendum)
Per [facility], Pt is coming from [place] with complaints of congestion and cough that started yesterday. Pt reports having productive cough with clear sputum. Hx of Cerebral Palsy and PNA. Pt is alert and oriented x4.   Vitals per EMS: 140/108, 95% on RA, 134 HR, 133 CBG. Pt was given 500 CC of fluid and 5 mg of ALbuterol during transport.

## 2016-08-15 NOTE — ED Notes (Signed)
PA Chris at the bedside  

## 2016-08-15 NOTE — ED Notes (Signed)
Spoke with [facility] about patient's pending bed assignment. AC is aware of bed needing to be cleaned and currently has personnel working on the problem.

## 2016-08-15 NOTE — ED Notes (Signed)
Attempted Report x1.   

## 2016-08-15 NOTE — Progress Notes (Signed)
Pharmacy Antibiotic Note  Thomas Ramos is a 46 y.o. male admitted on 08/15/2016 with sepsis.  Pharmacy has been consulted for vancomycin and levofloxacin dosing. Patient with current SCr of 0.73, estimated CrCl >100 ml/min.  Plan: - Vancomycin 1500 mg IV x1 loading dose - Vancomycin 1000 mg IV q8h - Levofloxacin 750 mg IV q24h - Monitor renal function, C&S and duration of therapy  Height: 5\' 8"  (172.7 cm) Weight: 160 lb (72.6 kg) IBW/kg (Calculated) : 68.4  Temp (24hrs), Avg:100.3 F (37.9 C), Min:99.5 F (37.5 C), Max:101.4 F (38.6 C)   Recent Labs Lab 08/15/16 1419 08/15/16 1446  WBC 6.7  --   CREATININE 0.73  --   LATICACIDVEN  --  2.44*    Estimated Creatinine Clearance: 114 mL/min (by C-G formula based on SCr of 0.73 mg/dL).    Allergies  Allergen Reactions  . Sulfamethoxazole-Trimethoprim Nausea And Vomiting  . Metronidazole Nausea And Vomiting  . Penicillins Hives, Nausea And Vomiting and Other (See Comments)    Has patient had a PCN reaction causing immediate rash, facial/tongue/throat swelling, SOB or lightheadedness with hypotension: No Has patient had a PCN reaction causing severe rash involving mucus membranes or skin necrosis: No Has patient had a PCN reaction that required hospitalization No Has patient had a PCN reaction occurring within the last 10 years: Yes If all of the above answers are "NO", then may proceed with Cephalosporin use.    Antimicrobials this admission: Vancomycin 6/2 >> Levofloxacin 6/2 >>  Dose adjustments this admission:  Microbiology results: 6/2 BCx:  6/2 UCx:  6/2 Sputum Cx:  Thank you for allowing pharmacy to be a part of this patient's care.  Dimitri Ped, PharmD, [place] PGY-2 Infectious Diseases Pharmacy Resident Pager: [phone] 08/15/2016 5:33 PM

## 2016-08-15 NOTE — ED Notes (Signed)
Admitting MD at the bedside.  

## 2016-08-15 NOTE — Progress Notes (Signed)
Paged regarding critical lactic. Nurses at number provided deny paging. There is a normal lactate that has resulted since the critical value. Continue current treatment.

## 2016-08-15 NOTE — ED Notes (Signed)
Pt being taken to Xray

## 2016-08-15 NOTE — ED Notes (Signed)
Pt returned from X-ray.  

## 2016-08-15 NOTE — ED Notes (Signed)
Called [facility] to make aware that I was coming up with the patient. Stated the room was not clean. Pt now back in the hallway in ED.

## 2016-08-15 NOTE — ED Provider Notes (Signed)
[facility] DEPT Provider Note   CSN: [id number] Arrival date & time: 08/15/16  1301     History   Chief Complaint Chief Complaint  Patient presents with  . Cough    HPI ZYQUAN CROTTY is a 46 y.o. male.  HPI Patient presents to the emergency department with cough and wheezing over the last few days.  The patient states that nothing seems to make the condition better or worse.  The patient states that he did not take any medications prior to arrival for her symptomsThe patient denies chest pain, shortness of breath, headache,blurred vision, neck pain, fever,  weakness, numbness, dizziness, anorexia, edema, abdominal pain, nausea, vomiting, diarrhea, rash, back pain, dysuria, hematemesis, bloody stool, near syncope, or syncope. Past Medical History:  Diagnosis Date  . Anxiety   . Cerebral palsy ([place])   . Chronic diastolic (congestive) heart failure ([place])   . CP (cerebral palsy) ([place])   . Diabetes mellitus without complication (HCC)    borderline  . Environmental allergies    takes inhalers if needed  . Esophageal stricture   . GERD (gastroesophageal reflux disease)   . Hypertension   . Motility disorder, esophageal   . Pneumonia   . 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 S/P Botox injection    approx every 4 months  . Seasonal allergies     Patient Active Problem List   Diagnosis Date Noted  . Onychomycosis 02/20/2016  . Essential hypertension   . OSA (obstructive sleep apnea) 12/11/2015  . Chronic diastolic (congestive) heart failure ([place])   . Chronic venous insufficiency   . Depression with anxiety 09/20/2015  . Hypersomnia 07/16/2015  . Asthma 07/16/2015  . GERD (gastroesophageal reflux disease) 07/16/2015  . Hematemesis with nausea   . Dysphagia 09/16/2010  . 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 BOTOX INJECTION N/A 12/21/2012   Procedure: BOTOX INJECTION;  Surgeon: Inda Castle, MD;   Location: [facility];  Service: Endoscopy;  Laterality: N/A;  . BOTOX INJECTION N/A 06/28/2014   Procedure: BOTOX INJECTION;  Surgeon: Inda Castle, MD;  Location: [facility];  Service: Endoscopy;  Laterality: N/A;  . ESOPHAGOGASTRODUODENOSCOPY N/A 12/21/2012   Procedure: ESOPHAGOGASTRODUODENOSCOPY (EGD);  Surgeon: Inda Castle, MD;  Location: [facility];  Service: Endoscopy;  Laterality: N/A;  . ESOPHAGOGASTRODUODENOSCOPY N/A 06/28/2014   Procedure: ESOPHAGOGASTRODUODENOSCOPY (EGD);  Surgeon: Inda Castle, MD;  Location: [facility];  Service: Endoscopy;  Laterality: N/A;  . ESOPHAGUS SURGERY     stretched esophagus  . legs    . MOUTH SURGERY    . TENDON RELEASE         Home Medications    Prior to Admission medications   Medication Sig Start Date End Date Taking? Authorizing Provider  acidophilus (RISAQUAD) CAPS capsule Take 1 capsule by mouth daily.    [provider]  albuterol (PROVENTIL HFA;VENTOLIN HFA) 108 (90 Base) MCG/ACT inhaler Inhale 1-2 puffs into the lungs every 6 (six) hours as needed for wheezing or shortness of breath. 08/26/15   Ripley Fraise, MD  amLODipine (NORVASC) 10 MG tablet Take 10 mg by mouth daily.    [provider]  baclofen (LIORESAL) 10 MG tablet Take 10 mg by mouth 3 (three) times daily as needed for muscle spasms.     [provider]  busPIRone (BUSPAR) 7.5 MG tablet Take 7.5 mg by mouth 2 (two) times daily as needed (for anxiety).  [provider]  cetirizine (ZYRTEC) 10 MG tablet Take 10 mg by mouth daily.     [provider]  citalopram (CELEXA) 10 MG tablet Take 10 mg by mouth daily.    [provider]  clindamycin (CLEOCIN) 150 MG capsule Take 1 capsule (150 mg total) by mouth every 6 (six) hours. Patient not taking: Reported on 06/03/2016 04/23/16   Mackuen, Fredia Sorrow, MD  doxycycline (VIBRAMYCIN) 100 MG capsule Take 1 capsule (100 mg total) by mouth 2 (two) times  daily. Patient not taking: Reported on 06/03/2016 05/01/16   Charlesetta Shanks, MD  fluticasone [facility]) 50 MCG/ACT nasal spray Place 1 spray into both nostrils daily as needed for rhinitis.     [provider]  HYDROcodone-acetaminophen (NORCO/VICODIN) 5-325 MG tablet Take 1 tablet by mouth every 6 (six) hours as needed for moderate pain. 11/13/15   Jola Schmidt, MD  montelukast (SINGULAIR) 10 MG tablet Take 10 mg by mouth at bedtime.     [provider]  mupirocin cream (BACTROBAN) 2 % Apply 1 application topically 2 (two) times daily. 01/31/16   Palumbo, April, MD  nitroGLYCERIN (NITROSTAT) 0.4 MG SL tablet Place 0.4 mg under the tongue every 5 (five) minutes as needed for chest pain.     [provider]  ranitidine (ZANTAC) 150 MG tablet Take 150 mg by mouth 2 (two) times daily.     [provider]  rosuvastatin (CRESTOR) 5 MG tablet Take 5 mg by mouth at bedtime.    [provider]  valsartan (DIOVAN) 80 MG tablet Take 80 mg by mouth daily.    [provider]    Family History Family History  Problem Relation Age of Onset  . Hypertension Father   . Lung cancer Father   . Diabetes Mother     Social History Social History  Substance Use Topics  . Smoking status: Former Research scientist (life sciences)  . Smokeless tobacco: Former Systems developer     Comment: smoked one time  . Alcohol use 0.0 oz/week     Comment: 2 beers every other Friday and Saturday     Allergies   Sulfamethoxazole-trimethoprim; Metronidazole; and Penicillins   Review of Systems Review of Systems All other systems negative except as documented in the HPI. All pertinent positives and negatives as reviewed in the HPI.  Physical Exam Updated Vital Signs BP (!) 107/95   Pulse (!) 129   Temp 99.9 F (37.7 C) (Rectal)   Resp (!) 26   Ht 5\' 8"  (1.727 m)   Wt 72.6 kg (160 lb)   SpO2 95%   BMI 24.33 kg/m   Physical Exam  Constitutional: He is oriented to person, place, and time. He  appears well-developed and well-nourished. No distress.  HENT:  Head: Normocephalic and atraumatic.  Mouth/Throat: Oropharynx is clear and moist.  Eyes: Pupils are equal, round, and reactive to light.  Neck: Normal range of motion. Neck supple.  Cardiovascular: Normal rate, regular rhythm and normal heart sounds.  Exam reveals no gallop and no friction rub.   No murmur heard. Pulmonary/Chest: Effort normal. No respiratory distress. He has wheezes. He has no rales.  Abdominal: Soft. Bowel sounds are normal. He exhibits no distension. There is no tenderness.  Neurological: He is alert and oriented to person, place, and time. He exhibits normal muscle tone. Coordination normal.  Skin: Skin is warm and dry. Capillary refill takes less than 2 seconds. No rash noted. No erythema.  Psychiatric: He has a normal mood and  affect. His behavior is normal.  Nursing note and vitals reviewed.    ED Treatments / Results  Labs (all labs ordered are listed, but only abnormal results are displayed) Labs Reviewed  BASIC METABOLIC PANEL - Abnormal; Notable for the following:       Result Value   Glucose, Bld 124 (*)    Calcium 8.8 (*)    All other components within normal limits  CBC WITH DIFFERENTIAL/PLATELET - Abnormal; Notable for the following:    Hemoglobin 12.8 (*)    HCT 38.6 (*)    All other components within normal limits  I-STAT CG4 LACTIC ACID, ED - Abnormal; Notable for the following:    Lactic Acid, Venous 2.44 (*)    All other components within normal limits    EKG  EKG Interpretation  Date/Time:  Saturday August 15 2016 13:12:12 EDT Ventricular Rate:  126 PR Interval:    QRS Duration: 94 QT Interval:  292 QTC Calculation: 423 R Axis:   107 Text Interpretation:  Sinus tachycardia Right axis deviation Borderline T wave abnormalities Baseline wander in lead(s) V2 V3 Confirmed by Ashok Cordia  MD, Lennette Bihari ([id number]) on 08/15/2016 2:14:28 PM       Radiology Dg Chest 2 View  Result Date:  08/15/2016 CLINICAL DATA:  Cough and congestion.  History of cerebral palsy. EXAM: CHEST  2 VIEW COMPARISON:  06/16/2016; 03/21/2016; 07/27/2015; chest CT - 09/20/2015 FINDINGS: Grossly unchanged cardiac silhouette and mediastinal contours given persistently reduced lung volumes. Chronic thickening of the right paratracheal stripe, presumably secondary to prominent vasculature. There is chronic elevation the right hemidiaphragm. Right medial basilar / retrocardiac opacities are unchanged compatible with known hiatal hernia. No new focal airspace opacities. No pleural effusion or pneumothorax. No evidence of edema. No acute osseus abnormalities. IMPRESSION: No acute cardiopulmonary disease. Specifically, no evidence of pneumonia. Electronically Signed   By: Sandi Mariscal M.D.   On: 08/15/2016 15:15    Procedures Procedures (including critical care time)  Medications Ordered in ED Medications  iopamidol (ISOVUE-370) 76 % injection (not administered)  sodium chloride 0.9 % bolus 500 mL (not administered)  albuterol (PROVENTIL) (2.5 MG/3ML) 0.083% nebulizer solution 5 mg (5 mg Nebulization Given 08/15/16 1335)  chlorpheniramine-HYDROcodone (TUSSIONEX) 10-8 MG/5ML suspension 5 mL (5 mLs Oral Given 08/15/16 1414)     Initial Impression / Assessment and Plan / ED Course  I have reviewed the triage vital signs and the nursing notes.  Pertinent labs & imaging results that were available during my care of the patient were reviewed by me and considered in my medical decision making (see chart for details).    Patient has been fairly tachycardic in the emergency department even before he received nebulized treatments.  Patient remains tachycardic and does have some tachypnea.  He does have abnormal breath sounds mainly on the right   Final Clinical Impressions(s) / ED Diagnoses   Final diagnoses:  SOB (shortness of breath)  Tachycardia    New Prescriptions New Prescriptions   No medications on file      Dalia Heading, Hershal Coria 08/15/16 1651    Lajean Saver, MD 08/16/16 1011

## 2016-08-15 NOTE — ED Notes (Signed)
Pt taken to Ct and lost IV. New IV placed.

## 2016-08-15 NOTE — H&P (Signed)
History and Physical    Thomas Ramos NAT:[id number] DOB: [birth year] DOA: 08/15/2016  PCP: Thomas Noon, MD [facility]) Patient coming from: Home  I have personally briefly reviewed patient's old medical records in [place]  Chief Complaint: Worsening cough and congestion  HPI: Thomas Ramos is a 46 y.o. male with medical history significant of cerebral palsy, chronic diastolic heart failure, diabetes mellitus, gastroesophageal reflux disease, esophageal motility disorder, esophageal stricture who presents to the ED with a three-day history of worsening productive cough of clear sputum, wheezing, congestion, shortness of breath. Patient states went to [facility] today to shop however due to his worsening cough as well as shortness of breath-upper and [employer] ended up calling EMS and patient was brought to the ED. The ED notes on arrival EMS noted patient had a blood pressure 140/108, sats of 95% on room at, heart rate of 134, CBG of 133. Patient was given some albuterol in the IV fluid bolus and brought to the ED. Patient denies any fevers, no chills, no nausea, no vomiting, no abdominal pain, no diarrhea, no constipation, no melena, no hematemesis, no hematochezia, no dysuria, no sick contacts, no recent travel.  ED Course: Patient was seen in the ED noted to have a lactic acid level of 2.44. Basic metabolic profile was unremarkable. CBC with a hemoglobin of 12.8 otherwise unremarkable. Chest x-ray negative for any acute abnormalities. EKG with a sinus tachycardia with a heart rate of 126. Patient noted to have heart rate go up as high as 144 with respiratory rate in the 30s and noted to have a temperature of 101.4.  Review of Systems: As per HPI otherwise 10 point review of systems negative.   Past Medical History:  Diagnosis Date  . Anxiety   . Cerebral palsy ([place])   . Chronic diastolic (congestive) heart failure ([place])   . CP (cerebral palsy) ([place])   . Diabetes mellitus without  complication (HCC)    borderline  . Environmental allergies    takes inhalers if needed  . Esophageal stricture   . GERD (gastroesophageal reflux disease)   . Hypertension   . Motility disorder, esophageal   . Pneumonia   . 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 S/P Botox injection    approx every 4 months  . Seasonal allergies     Past Surgical History:  Procedure Laterality Date  . BOTOX INJECTION N/A 12/21/2012   Procedure: BOTOX INJECTION;  Surgeon: Inda Castle, MD;  Location: [facility];  Service: Endoscopy;  Laterality: N/A;  . BOTOX INJECTION N/A 06/28/2014   Procedure: BOTOX INJECTION;  Surgeon: Inda Castle, MD;  Location: [place];  Service: Endoscopy;  Laterality: N/A;  . ESOPHAGOGASTRODUODENOSCOPY N/A 12/21/2012   Procedure: ESOPHAGOGASTRODUODENOSCOPY (EGD);  Surgeon: Inda Castle, MD;  Location: [facility];  Service: Endoscopy;  Laterality: N/A;  . ESOPHAGOGASTRODUODENOSCOPY N/A 06/28/2014   Procedure: ESOPHAGOGASTRODUODENOSCOPY (EGD);  Surgeon: Inda Castle, MD;  Location: [place];  Service: Endoscopy;  Laterality: N/A;  . ESOPHAGUS SURGERY     stretched esophagus  . legs    . MOUTH SURGERY    . TENDON RELEASE       reports that he has quit smoking. He has quit using smokeless tobacco. He reports that he drinks alcohol. He reports that he does not use drugs.  Allergies  Allergen Reactions  . Sulfamethoxazole-Trimethoprim Nausea And Vomiting  . Metronidazole Nausea And Vomiting  . Penicillins Hives, Nausea And Vomiting and Other (  See Comments)    Has patient had a PCN reaction causing immediate rash, facial/tongue/throat swelling, SOB or lightheadedness with hypotension: No Has patient had a PCN reaction causing severe rash involving mucus membranes or skin necrosis: No Has patient had a PCN reaction that required hospitalization No Has patient had a PCN reaction occurring within the last 10 years: Yes If all of the above answers are  "NO", then may proceed with Cephalosporin use.    Family History  Problem Relation Age of Onset  . Hypertension Father   . Lung cancer Father   . Diabetes Mother    Mother alive age unknown. Patient states he does not speak with to his mother. Father deceased in his 25s patient states father died in his sleep with a history of diabetes.  Prior to Admission medications   Medication Sig Start Date End Date Taking? Authorizing Provider  acidophilus (RISAQUAD) CAPS capsule Take 1 capsule by mouth daily.    [provider]  albuterol (PROVENTIL HFA;VENTOLIN HFA) 108 (90 Base) MCG/ACT inhaler Inhale 1-2 puffs into the lungs every 6 (six) hours as needed for wheezing or shortness of breath. 08/26/15   Ripley Fraise, MD  amLODipine (NORVASC) 10 MG tablet Take 10 mg by mouth daily.    [provider]  baclofen (LIORESAL) 10 MG tablet Take 10 mg by mouth 3 (three) times daily as needed for muscle spasms.     [provider]  busPIRone (BUSPAR) 7.5 MG tablet Take 7.5 mg by mouth 2 (two) times daily as needed (for anxiety).     [provider]  cetirizine (ZYRTEC) 10 MG tablet Take 10 mg by mouth daily.     [provider]  citalopram (CELEXA) 10 MG tablet Take 10 mg by mouth daily.    [provider]  clindamycin (CLEOCIN) 150 MG capsule Take 1 capsule (150 mg total) by mouth every 6 (six) hours. Patient not taking: Reported on 06/03/2016 04/23/16   Mackuen, Fredia Sorrow, MD  doxycycline (VIBRAMYCIN) 100 MG capsule Take 1 capsule (100 mg total) by mouth 2 (two) times daily. Patient not taking: Reported on 06/03/2016 05/01/16   Charlesetta Shanks, MD  fluticasone [facility]) 50 MCG/ACT nasal spray Place 1 spray into both nostrils daily as needed for rhinitis.     [provider]  HYDROcodone-acetaminophen (NORCO/VICODIN) 5-325 MG tablet Take 1 tablet by mouth every 6 (six) hours as needed for moderate pain. 11/13/15   Jola Schmidt, MD  montelukast  (SINGULAIR) 10 MG tablet Take 10 mg by mouth at bedtime.     [provider]  mupirocin cream (BACTROBAN) 2 % Apply 1 application topically 2 (two) times daily. 01/31/16   Palumbo, April, MD  nitroGLYCERIN (NITROSTAT) 0.4 MG SL tablet Place 0.4 mg under the tongue every 5 (five) minutes as needed for chest pain.     [provider]  ranitidine (ZANTAC) 150 MG tablet Take 150 mg by mouth 2 (two) times daily.     [provider]  rosuvastatin (CRESTOR) 5 MG tablet Take 5 mg by mouth at bedtime.    [provider]  valsartan (DIOVAN) 80 MG tablet Take 80 mg by mouth daily.    [provider]    Physical Exam: Vitals:   08/15/16 1630 08/15/16 1645 08/15/16 1700 08/15/16 1731  BP: 107/74 113/84 120/75   Pulse: (!) 128 (!) 120 (!) 119 (!) 118  Resp: 20 (!) 26 (!) 25 (!) 24  Temp:    (!) 101.4  F (38.6 C)  TempSrc:    Rectal  SpO2: 92% 97% 95% 98%  Weight:      Height:        Constitutional: NAD, calm, comfortable Vitals:   08/15/16 1630 08/15/16 1645 08/15/16 1700 08/15/16 1731  BP: 107/74 113/84 120/75   Pulse: (!) 128 (!) 120 (!) 119 (!) 118  Resp: 20 (!) 26 (!) 25 (!) 24  Temp:    (!) 101.4 F (38.6 C)  TempSrc:    Rectal  SpO2: 92% 97% 95% 98%  Weight:      Height:       Eyes: PERRLA, lids and conjunctivae normal ENMT: Mucous membranes are dry. Posterior pharynx clear of any exudate or lesions.Normal dentition.  Neck: normal, supple, no masses, no thyromegaly Respiratory: Poor air movement. Tight. Minimal wheezing. Some scattered coarse breath sounds.  Cardiovascular: Tachycardic. no murmurs / rubs / gallops. No extremity edema. 2+ pedal pulses. No carotid bruits.  Abdomen: no tenderness, no masses palpated. No hepatosplenomegaly. Bowel sounds positive.  Musculoskeletal: no clubbing / cyanosis. Right upper extremity contractures. Contractures of lower extremities.  Skin: no rashes, lesions, ulcers. No induration Neurologic: CN 2-12  grossly intact. Sensation intact, DTR normal. Strength 5/5 in all 4.  Psychiatric: Normal judgment and insight. Alert and oriented x 3. Normal mood.   Labs on Admission: I have personally reviewed following labs and imaging studies  CBC:  Recent Labs Lab 08/15/16 1419  WBC 6.7  NEUTROABS 4.7  HGB 12.8*  HCT 38.6*  MCV 79.6  PLT 098   Basic Metabolic Panel:  Recent Labs Lab 08/15/16 1419  NA 137  K 3.7  CL 104  CO2 22  GLUCOSE 124*  BUN 13  CREATININE 0.73  CALCIUM 8.8*   GFR: Estimated Creatinine Clearance: 114 mL/min (by C-G formula based on SCr of 0.73 mg/dL). Liver Function Tests: No results for input(s): AST, ALT, ALKPHOS, BILITOT, PROT, ALBUMIN in the last 168 hours. No results for input(s): LIPASE, AMYLASE in the last 168 hours. No results for input(s): AMMONIA in the last 168 hours. Coagulation Profile: No results for input(s): INR, PROTIME in the last 168 hours. Cardiac Enzymes: No results for input(s): CKTOTAL, CKMB, CKMBINDEX, TROPONINI in the last 168 hours. BNP (last 3 results) No results for input(s): PROBNP in the last 8760 hours. HbA1C: No results for input(s): HGBA1C in the last 72 hours. CBG: No results for input(s): GLUCAP in the last 168 hours. Lipid Profile: No results for input(s): CHOL, HDL, LDLCALC, TRIG, CHOLHDL, LDLDIRECT in the last 72 hours. Thyroid Function Tests: No results for input(s): TSH, T4TOTAL, FREET4, T3FREE, THYROIDAB in the last 72 hours. Anemia Panel: No results for input(s): VITAMINB12, FOLATE, FERRITIN, TIBC, IRON, RETICCTPCT in the last 72 hours. Urine analysis:    Component Value Date/Time   COLORURINE YELLOW 03/21/2016 0155   APPEARANCEUR CLEAR 03/21/2016 0155   LABSPEC 1.028 03/21/2016 0155   PHURINE 5.0 03/21/2016 0155   GLUCOSEU NEGATIVE 03/21/2016 0155   HGBUR NEGATIVE 03/21/2016 0155   BILIRUBINUR NEGATIVE 03/21/2016 0155   KETONESUR NEGATIVE 03/21/2016 0155   PROTEINUR NEGATIVE 03/21/2016 0155    UROBILINOGEN 1.0 05/26/2014 2211   NITRITE NEGATIVE 03/21/2016 0155   LEUKOCYTESUR NEGATIVE 03/21/2016 0155    Radiological Exams on Admission: Dg Chest 2 View  Result Date: 08/15/2016 CLINICAL DATA:  Cough and congestion.  History of cerebral palsy. EXAM: CHEST  2 VIEW COMPARISON:  06/16/2016; 03/21/2016; 07/27/2015; chest CT - 09/20/2015 FINDINGS: Grossly unchanged cardiac silhouette and mediastinal  contours given persistently reduced lung volumes. Chronic thickening of the right paratracheal stripe, presumably secondary to prominent vasculature. There is chronic elevation the right hemidiaphragm. Right medial basilar / retrocardiac opacities are unchanged compatible with known hiatal hernia. No new focal airspace opacities. No pleural effusion or pneumothorax. No evidence of edema. No acute osseus abnormalities. IMPRESSION: No acute cardiopulmonary disease. Specifically, no evidence of pneumonia. Electronically Signed   By: Sandi Mariscal M.D.   On: 08/15/2016 15:15    EKG: Independently reviewed. Sinus tachycardia heart rate 126  Assessment/Plan Principal Problem:   Acute respiratory distress Active Problems:   SIRS (systemic inflammatory response syndrome) (HCC)   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HCC)   GERD (gastroesophageal reflux disease)   Depression with anxiety   CAP (community acquired pneumonia)   Chronic diastolic (congestive) heart failure (HCC)   OSA (obstructive sleep apnea)   Essential hypertension   Tachycardia   #1 acute respiratory distress Concern for probable community acquired pneumonia versus acute respiratory acidosis versus concern for PE. Patient presented with worsening cough and congestion with some associated shortness of breath and wheezing. Patient on examination with poor air movement. Patient also noted to be tachycardic with heart rates as high as the 140s to 150s, tachypneic with respiratory rate high 20s to 30s with borderline blood pressure. Patient also  noted to have a temperature 101.4. Patient with a history of cerebral palsy and a prior history of some esophageal dysmotility concern for for possible aspiration. Will admit patient to the step down for closer monitoring. Check a sputum Gram stain and culture, check a urine Legionella antigen, check a urine pneumococcus antigen, check blood cultures 2, check a lactic acid level, check a pro-calcitonin check a UA with cultures and sensitivities. ABG pending. Place on IV Solu-Medrol, Pulmicort, scheduled Xopenex and Atrovent nebs, Flonase, Claritin, PPI, saline nasal nebs, Mucinex, chest PT, IV vancomycin, IV Levaquin. Repeat chest x-ray in the morning. Will asked PCCM to monitor patient via E Link and if worsening respiratory status then a formal consult will be needed.  #2 systemic inflammatory response syndrome Patient meeting criteria for systemic inflammatory response syndrome on admission with tachycardia, tachypnea, fever, elevated lactic acid level. Patient with congestion, poor air movement, cough, borderline blood pressure. Sepsis protocol has been ordered. Patient be pancultured. Check a lactic acid level, check a pro-calcitonin, check a urine Legionella antigen, check a urine pneumococcus antigen. Placed empirically on IV vancomycin and IV Levaquin. Follow.  #3 chronic diastolic heart failure Seems compensated. Monitor volume status closely as patient on the sepsis protocol and likely will be hydrated aggressively. Hold anti-hypertensive medications as patient blood pressure is borderline.  #4 hypertension Hold antihypertensive medications. BP borderline.  #5 gastroesophageal reflux disease Continue H2 blocker. PPI.  #6 congenital cerebral palsy Continue baclofen.  #7 depression and anxiety Stable. Continue BuSpar, Celexa.  #8 obstructive sleep apnea  DVT prophylaxis: Lovenox Code Status: Full Family Communication: Updated patient no family at bedside. Disposition Plan: Home vs  SNF pending hospitalization Consults called: None Admission status: Admit to SDU   [facility] MD [facility] Pager [phone]  If 7PM-7AM, please contact night-coverage [email or web address] Password [facility]  08/15/2016, 5:48 PM

## 2016-08-16 ENCOUNTER — Inpatient Hospital Stay (HOSPITAL_COMMUNITY): Payer: Medicare Other

## 2016-08-16 DIAGNOSIS — R000 Tachycardia, unspecified: Secondary | ICD-10-CM

## 2016-08-16 DIAGNOSIS — L8990 Pressure ulcer of unspecified site, unspecified stage: Secondary | ICD-10-CM | POA: Insufficient documentation

## 2016-08-16 DIAGNOSIS — G809 Cerebral palsy, unspecified: Secondary | ICD-10-CM

## 2016-08-16 LAB — INFLUENZA PANEL BY PCR (TYPE A & B)
INFLBPCR: NEGATIVE
Influenza A By PCR: NEGATIVE

## 2016-08-16 LAB — RESPIRATORY PANEL BY PCR
Adenovirus: NOT DETECTED
BORDETELLA PERTUSSIS-RVPCR: NOT DETECTED
CORONAVIRUS OC43-RVPPCR: NOT DETECTED
Chlamydophila pneumoniae: NOT DETECTED
Coronavirus 229E: NOT DETECTED
Coronavirus HKU1: NOT DETECTED
Coronavirus NL63: NOT DETECTED
INFLUENZA A-RVPPCR: NOT DETECTED
INFLUENZA B-RVPPCR: NOT DETECTED
Metapneumovirus: NOT DETECTED
Mycoplasma pneumoniae: NOT DETECTED
PARAINFLUENZA VIRUS 1-RVPPCR: NOT DETECTED
PARAINFLUENZA VIRUS 4-RVPPCR: NOT DETECTED
Parainfluenza Virus 2: NOT DETECTED
Parainfluenza Virus 3: NOT DETECTED
RESPIRATORY SYNCYTIAL VIRUS-RVPPCR: NOT DETECTED
Rhinovirus / Enterovirus: DETECTED — AB

## 2016-08-16 LAB — GLUCOSE, CAPILLARY
Glucose-Capillary: 168 mg/dL — ABNORMAL HIGH (ref 65–99)
Glucose-Capillary: 279 mg/dL — ABNORMAL HIGH (ref 65–99)
Glucose-Capillary: 287 mg/dL — ABNORMAL HIGH (ref 65–99)
Glucose-Capillary: 258 mg/dL — ABNORMAL HIGH (ref 65–99)

## 2016-08-16 LAB — CBC WITH DIFFERENTIAL/PLATELET
BASOS PCT: 0 %
Basophils Absolute: 0 10*3/uL (ref 0.0–0.1)
Eosinophils Absolute: 0 10*3/uL (ref 0.0–0.7)
Eosinophils Relative: 0 %
HEMATOCRIT: 34.1 % — AB (ref 39.0–52.0)
HEMOGLOBIN: 10.8 g/dL — AB (ref 13.0–17.0)
LYMPHS ABS: 0.6 10*3/uL — AB (ref 0.7–4.0)
LYMPHS PCT: 16 %
MCH: 25.7 pg — AB (ref 26.0–34.0)
MCHC: 31.7 g/dL (ref 30.0–36.0)
MCV: 81.2 fL (ref 78.0–100.0)
MONO ABS: 0 10*3/uL — AB (ref 0.1–1.0)
MONOS PCT: 1 %
NEUTROS ABS: 3.3 10*3/uL (ref 1.7–7.7)
NEUTROS PCT: 83 %
Platelets: 142 10*3/uL — ABNORMAL LOW (ref 150–400)
RBC: 4.2 MIL/uL — ABNORMAL LOW (ref 4.22–5.81)
RDW: 13.7 % (ref 11.5–15.5)
WBC: 3.9 10*3/uL — ABNORMAL LOW (ref 4.0–10.5)

## 2016-08-16 LAB — URINE CULTURE

## 2016-08-16 LAB — HIV ANTIBODY (ROUTINE TESTING W REFLEX): HIV SCREEN 4TH GENERATION: NONREACTIVE

## 2016-08-16 LAB — COMPREHENSIVE METABOLIC PANEL
ALBUMIN: 2.8 g/dL — AB (ref 3.5–5.0)
ALK PHOS: 102 U/L (ref 38–126)
ALT: 16 U/L — ABNORMAL LOW (ref 17–63)
AST: 23 U/L (ref 15–41)
Anion gap: 8 (ref 5–15)
BUN: 10 mg/dL (ref 6–20)
CHLORIDE: 108 mmol/L (ref 101–111)
CO2: 20 mmol/L — AB (ref 22–32)
Calcium: 8.1 mg/dL — ABNORMAL LOW (ref 8.9–10.3)
Creatinine, Ser: 0.7 mg/dL (ref 0.61–1.24)
GFR calc non Af Amer: >60 mL/min (ref >60)
GLUCOSE: 233 mg/dL — AB (ref 65–99)
POTASSIUM: 3.9 mmol/L (ref 3.5–5.1)
SODIUM: 136 mmol/L (ref 135–145)
Total Bilirubin: 0.8 mg/dL (ref 0.3–1.2)
Total Protein: 6 g/dL — ABNORMAL LOW (ref 6.5–8.1)

## 2016-08-16 LAB — STREP PNEUMONIAE URINARY ANTIGEN: STREP PNEUMO URINARY ANTIGEN: NEGATIVE

## 2016-08-16 LAB — LACTIC ACID, PLASMA
LACTIC ACID, VENOUS: 2.1 mmol/L — AB (ref 0.5–1.9)
LACTIC ACID, VENOUS: 5.8 mmol/L — AB (ref 0.5–1.9)

## 2016-08-16 MED ORDER — METHYLPREDNISOLONE SODIUM SUCC 125 MG IJ SOLR
60.0000 mg | Freq: Two times a day (BID) | INTRAMUSCULAR | Status: DC
  Administered 2016-08-17: 60 mg via INTRAVENOUS
  Filled 2016-08-16: qty 2

## 2016-08-16 MED ORDER — INSULIN ASPART 100 UNIT/ML ~~LOC~~ SOLN
0.0000 [IU] | Freq: Three times a day (TID) | SUBCUTANEOUS | Status: DC
  Administered 2016-08-16: 2 [IU] via SUBCUTANEOUS
  Administered 2016-08-16 – 2016-08-17 (×2): 5 [IU] via SUBCUTANEOUS
  Administered 2016-08-17: 3 [IU] via SUBCUTANEOUS
  Administered 2016-08-17: 2 [IU] via SUBCUTANEOUS
  Administered 2016-08-18: 3 [IU] via SUBCUTANEOUS

## 2016-08-16 MED ORDER — INSULIN ASPART 100 UNIT/ML ~~LOC~~ SOLN
0.0000 [IU] | Freq: Every day | SUBCUTANEOUS | Status: DC
  Administered 2016-08-16: 3 [IU] via SUBCUTANEOUS

## 2016-08-16 MED ORDER — LEVALBUTEROL HCL 0.63 MG/3ML IN NEBU
0.6300 mg | INHALATION_SOLUTION | Freq: Three times a day (TID) | RESPIRATORY_TRACT | Status: DC
  Administered 2016-08-17 – 2016-08-18 (×5): 0.63 mg via RESPIRATORY_TRACT
  Filled 2016-08-16 (×5): qty 3

## 2016-08-16 MED ORDER — PROMETHAZINE HCL 25 MG/ML IJ SOLN
12.5000 mg | Freq: Four times a day (QID) | INTRAMUSCULAR | Status: DC | PRN
  Administered 2016-08-16: 25 mg via INTRAVENOUS
  Filled 2016-08-16: qty 1

## 2016-08-16 MED ORDER — PNEUMOCOCCAL VAC POLYVALENT 25 MCG/0.5ML IJ INJ
0.5000 mL | INJECTION | INTRAMUSCULAR | Status: DC
  Filled 2016-08-16: qty 0.5

## 2016-08-16 MED ORDER — IPRATROPIUM BROMIDE 0.02 % IN SOLN
0.5000 mg | Freq: Three times a day (TID) | RESPIRATORY_TRACT | Status: DC
  Administered 2016-08-17 – 2016-08-18 (×5): 0.5 mg via RESPIRATORY_TRACT
  Filled 2016-08-16 (×5): qty 2.5

## 2016-08-16 NOTE — Evaluation (Signed)
Physical Therapy Evaluation Patient Details Name: Thomas Ramos MRN: [id number] DOB: Apr 22, 1970 Today's Date: 08/16/2016   History of Present Illness  46 y.o. male with medical history significant of cerebral palsy, chronic diastolic heart failure, diabetes mellitus, gastroesophageal reflux disease, esophageal motility disorder, esophageal stricture who presents to the ED with a three-day history of worsening productive cough of clear sputum, wheezing, congestion, shortness of breath.   Clinical Impression  Patient is functioning at his baseline status.  Uses electric w/c for mobility.  Caregivers provide total assist for transfers, ADL's.  No acute PT needs identified - PT will sign off.    Follow Up Recommendations No PT follow up;Supervision for mobility/OOB (Continue caregiver support)    Equipment Recommendations  None recommended by PT    Recommendations for Other Services       Precautions / Restrictions Precautions Precautions: Fall Restrictions Weight Bearing Restrictions: No      Mobility  Bed Mobility               General bed mobility comments: Patient in recliner as PT entered room.  Required +2 total assist to scoot patient back into chair and reposition LE's.  Transfers                 General transfer comment: Nursing using lift equipment to transfer bed <> chair.  Ambulation/Gait             General Gait Details: N/A  Stairs            Wheelchair Mobility    Modified Rankin (Stroke Patients Only)       Balance Overall balance assessment: Needs assistance   Sitting balance-Leahy Scale: Zero Sitting balance - Comments: Requires max assist to maintain sitting balance.  Unable to use UE's to provide support.                                     Pertinent Vitals/Pain Pain Assessment: No/denies pain    Home Living Family/patient expects to be discharged to:: Private residence Living Arrangements:  Alone Available Help at Discharge: Personal care attendant Type of Home: Apartment Home Access: Ramped entrance     Home Layout: One level Home Equipment: Wheelchair - power;Hospital bed Additional Comments: Per patient, he has caregivers during most of the day and night.    Prior Function Level of Independence: Needs assistance   Gait / Transfers Assistance Needed: Per patient, his caregiver lifts him into his electric w/c.  ADL's / Homemaking Assistance Needed: total A for ADls.        Hand Dominance   Dominant Hand: Left    Extremity/Trunk Assessment   Upper Extremity Assessment Upper Extremity Assessment: Defer to OT evaluation RUE Deficits / Details: limited ROM; contractures thorughout LUE Deficits / Details: limited finger flexion but functional    Lower Extremity Assessment Lower Extremity Assessment: RLE deficits/detail;LLE deficits/detail RLE Deficits / Details: Contractures in hip flexion, knee flexion at least 90*, and DF.  Minimal movement noted. LLE Deficits / Details: Contractures in hip flexion, knee flexion at least 90*, and DF.  Patient able to initiate hip flexion, lifting LLE against gravity small distance.  2/5 strength at ankle    Cervical / Trunk Assessment Cervical / Trunk Assessment: Kyphotic  Communication   Communication: No difficulties (Slurred but easily understood)  Cognition Arousal/Alertness: Awake/alert Behavior During Therapy: WFL for tasks assessed/performed Overall Cognitive Status: Within Functional  Limits for tasks assessed                                        General Comments      Exercises     Assessment/Plan    PT Assessment Patent does not need any further PT services  PT Problem List         PT Treatment Interventions      PT Goals (Current goals can be found in the Care Plan section)  Acute Rehab PT Goals Patient Stated Goal: return home PT Goal Formulation: All assessment and education  complete, DC therapy    Frequency     Barriers to discharge        Co-evaluation               AM-PAC PT "6 Clicks" Daily Activity  Outcome Measure Difficulty turning over in bed (including adjusting bedclothes, sheets and blankets)?: Total Difficulty moving from lying on back to sitting on the side of the bed? : Total Difficulty sitting down on and standing up from a chair with arms (e.g., wheelchair, bedside commode, etc,.)?: Total Help needed moving to and from a bed to chair (including a wheelchair)?: Total Help needed walking in hospital room?: Total Help needed climbing 3-5 steps with a railing? : Total 6 Click Score: 6    End of Session   Activity Tolerance: Patient tolerated treatment well Patient left: in chair;with call bell/phone within reach Nurse Communication: Need for lift equipment (Patient sitting more upright in chair with LE's supported) PT Visit Diagnosis: Muscle weakness (generalized) (M62.81);Other symptoms and signs involving the nervous system (R29.898)    Time: 8341-9622 PT Time Calculation (min) (ACUTE ONLY): 14 min   Charges:   PT Evaluation $PT Eval High Complexity: 1 Procedure     PT G Codes:        Carita Pian. Sanjuana Kava, [facility] Acute Rehab Services Pager [place] 08/16/2016, 5:34 PM

## 2016-08-16 NOTE — Progress Notes (Signed)
CRITICAL VALUE ALERT  Critical Value:  Lactic acid 2.1  Date & Time Notied:  08/16/16 0400  Provider Notified: Yes  Orders Received/Actions taken: Called back. Monitor and watch next lactic acid per MD.

## 2016-08-16 NOTE — Progress Notes (Signed)
PROGRESS NOTE    Thomas Ramos  QPR:[id number] DOB: 1970-07-06 DOA: 08/15/2016 PCP: Chesley Noon, MD    Brief Narrative:  Patient is a 46 year old gentleman history of cerebral palsy, chronic diastolic heart failure, diabetes, GERD, esophageal motility disorder present to the ED with a three-day history of worsening productive cough of clear sputum, wheezing, congestion, shortness of breath. Patient noted on admission to be thick, tachycardic, fever of 101.2, soft blood pressures and a such patient was placed in the stepdown unit.   Assessment & Plan:   Principal Problem:   Acute respiratory distress Active Problems:   SIRS (systemic inflammatory response syndrome) (HCC)   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HCC)   GERD (gastroesophageal reflux disease)   Depression with anxiety   CAP (community acquired pneumonia)   Chronic diastolic (congestive) heart failure (HCC)   OSA (obstructive sleep apnea)   Essential hypertension   Tachycardia   Pressure injury of skin  #1 acute respiratory distress Concern for probable clinical community acquired pneumonia versus Viral URI. Marland Kitchen Patient presented with worsening cough and congestion with some associated shortness of breath and wheezing. Patient on examination with poor air movement which is improving.. Patient also noted to be tachycardic with heart rates as high as the 140s to 150s, tachypneic with respiratory rate high 20s to 30s with borderline blood pressure. Patient also noted to have a temperature 101.4. Patient with a history of cerebral palsy and a prior history of some esophageal dysmotility concern for for possible aspiration. Patient currently is step down unit. Sputum Gram stain and culture pending. Urine Legionella antigen pending. Urine pneumococcus antigen negative. Check a lactic acid level in the morning. IV Solu-Medrol taper, Flonase, scheduled Xopenex and Atrovent nebs, Claritin, IV vancomycin, IV Levaquin, Flonase, Claritin, PPI,  nasal nebulizes:   #2 systemic inflammatory response syndrome Patient meeting criteria for systemic inflammatory response syndrome on admission with tachycardia, tachypnea, fever, elevated lactic acid level. Patient with congestion, poor air movement, cough, borderline blood pressure. Sepsis protocol has been ordered. Patient has been pancultured. Lactic acid level elevated however trending down. Urine pneumococcus antigen negative. Urine Legionella antigen pending. CT chest negative for any acute abnormalities. Patient with clinical improvement and currently afebrile. Influenza PCR negative. Respiratory viral panel pending. Continue empiric IV vancomycin and IV Levaquin. Follow.  #3 chronic diastolic heart failure Seems compensated. Monitor volume status closely as patient on the sepsis protocol and likely will be hydrated aggressively. Continue to hold anti-hypertensive medications as patient blood pressure is improving.  #4 hypertension Borderline blood pressure improved. Continue to hold antihypertensive medications. IV fluids monitor for volume overload.  #5 gastroesophageal reflux disease Continue H2 blocker. PPI.  #6 congenital cerebral palsy Continue baclofen.  #7 depression and anxiety Stable. Continue BuSpar, Celexa.  #8 obstructive sleep apnea   DVT prophylaxis: Lovenox Code Status: Full Family Communication: Updated patient. No family present. Disposition Plan: Remain in stepdown. Back to prior living environment when medically stable and at baseline.   Consultants:   None  Procedures:   Chest x-ray 08/15/2016, 08/16/2016  CT angiogram chest 08/15/2016  Antimicrobials:   IV Levaquin 08/15/2016  IV vancomycin 08/15/2016   Subjective: Patient sitting up in bed eating breakfast. Patient states feels better than on admission. Patient still congested.  Objective: Vitals:   08/16/16 0700 08/16/16 0800 08/16/16 0819 08/16/16 0821  BP: 120/70 126/73      Pulse: 74 74    Resp: (!) 24 (!) 22    Temp:  98.4 F (36.9 C)  TempSrc:  Oral    SpO2: 98% 93% 93% 99%  Weight:      Height:        Intake/Output Summary (Last 24 hours) at 08/16/16 0936 Last data filed at 08/16/16 0816  Gross per 24 hour  Intake          3381.25 ml  Output             2500 ml  Net           881.25 ml   Filed Weights   08/15/16 1310 08/15/16 2031  Weight: 72.6 kg (160 lb) 72 kg (158 lb 11.7 oz)    Examination:  General exam: Appears calm and comfortable  Respiratory system: Scattered coarse breath sounds. Fair air movement. Minimal wheezing. No crackles. Respiratory effort normal. Cardiovascular system: S1 & S2 heard, RRR. No JVD, murmurs, rubs, gallops or clicks. No pedal edema. Gastrointestinal system: Abdomen is nondistended, soft and nontender. No organomegaly or masses felt. Normal bowel sounds heard. Central nervous system: Alert and oriented. No focal neurological deficits. Extremities: Right upper extremity contractures. Contractures of lower extremities. Skin: No rashes, lesions or ulcers Psychiatry: Judgement and insight appear normal. Mood & affect appropriate.     Data Reviewed: I have personally reviewed following labs and imaging studies  CBC:  Recent Labs Lab 08/15/16 1419 08/16/16 0312  WBC 6.7 3.9*  NEUTROABS 4.7 3.3  HGB 12.8* 10.8*  HCT 38.6* 34.1*  MCV 79.6 81.2  PLT 191 841*   Basic Metabolic Panel:  Recent Labs Lab 08/15/16 1419 08/15/16 1741 08/16/16 0312  NA 137  --  136  K 3.7  --  3.9  CL 104  --  108  CO2 22  --  20*  GLUCOSE 124*  --  233*  BUN 13  --  10  CREATININE 0.73  --  0.70  CALCIUM 8.8*  --  8.1*  MG  --  1.8  --    GFR: Estimated Creatinine Clearance: 114 mL/min (by C-G formula based on SCr of 0.7 mg/dL). Liver Function Tests:  Recent Labs Lab 08/16/16 0312  AST 23  ALT 16*  ALKPHOS 102  BILITOT 0.8  PROT 6.0*  ALBUMIN 2.8*   No results for input(s): LIPASE, AMYLASE in the last  168 hours. No results for input(s): AMMONIA in the last 168 hours. Coagulation Profile:  Recent Labs Lab 08/15/16 1741  INR 1.13   Cardiac Enzymes: No results for input(s): CKTOTAL, CKMB, CKMBINDEX, TROPONINI in the last 168 hours. BNP (last 3 results) No results for input(s): PROBNP in the last 8760 hours. HbA1C: No results for input(s): HGBA1C in the last 72 hours. CBG: No results for input(s): GLUCAP in the last 168 hours. Lipid Profile: No results for input(s): CHOL, HDL, LDLCALC, TRIG, CHOLHDL, LDLDIRECT in the last 72 hours. Thyroid Function Tests: No results for input(s): TSH, T4TOTAL, FREET4, T3FREE, THYROIDAB in the last 72 hours. Anemia Panel: No results for input(s): VITAMINB12, FOLATE, FERRITIN, TIBC, IRON, RETICCTPCT in the last 72 hours. Sepsis Labs:  Recent Labs Lab 08/15/16 1741  08/15/16 1811 08/15/16 2055 08/16/16 0049 08/16/16 0312  PROCALCITON <0.10  --   --   --   --   --   LATICACIDVEN  --   < > 1.23 2.0* 5.8* 2.1*  < > = values in this interval not displayed.  Recent Results (from the past 240 hour(s))  MRSA PCR Screening     Status: None   Collection Time: 08/15/16  8:36 PM  Result Value Ref Range Status   MRSA by PCR NEGATIVE NEGATIVE Final    Comment:        The GeneXpert MRSA Assay (FDA approved for NASAL specimens only), is one component of a comprehensive MRSA colonization surveillance program. It is not intended to diagnose MRSA infection nor to guide or monitor treatment for MRSA infections.          Radiology Studies: Dg Chest 2 View  Result Date: 08/15/2016 CLINICAL DATA:  Cough and congestion.  History of cerebral palsy. EXAM: CHEST  2 VIEW COMPARISON:  06/16/2016; 03/21/2016; 07/27/2015; chest CT - 09/20/2015 FINDINGS: Grossly unchanged cardiac silhouette and mediastinal contours given persistently reduced lung volumes. Chronic thickening of the right paratracheal stripe, presumably secondary to prominent vasculature.  There is chronic elevation the right hemidiaphragm. Right medial basilar / retrocardiac opacities are unchanged compatible with known hiatal hernia. No new focal airspace opacities. No pleural effusion or pneumothorax. No evidence of edema. No acute osseus abnormalities. IMPRESSION: No acute cardiopulmonary disease. Specifically, no evidence of pneumonia. Electronically Signed   By: Sandi Mariscal M.D.   On: 08/15/2016 15:15   Ct Angio Chest Pe W Or Wo Contrast  Result Date: 08/15/2016 CLINICAL DATA:  Shortness of breath, tachycardia EXAM: CT ANGIOGRAPHY CHEST WITH CONTRAST TECHNIQUE: Multidetector CT imaging of the chest was performed using the standard protocol during bolus administration of intravenous contrast. Multiplanar CT image reconstructions and MIPs were obtained to evaluate the vascular anatomy. CONTRAST:  100 cc Isovue 370 IV COMPARISON:  Chest x-ray earlier today FINDINGS: Cardiovascular: No filling defects in the pulmonary arteries to suggest pulmonary emboli. Heart is normal size. Aorta is normal caliber. Mediastinum/Nodes: No mediastinal, hilar, or axillary adenopathy. Large hiatal hernia. Esophagus is dilated. Lungs/Pleura: Lungs are clear. No focal airspace opacities or suspicious nodules. No effusions. Upper Abdomen: Imaging into the upper abdomen shows no acute findings. Musculoskeletal: Chest wall soft tissues are unremarkable. No acute bony abnormality. Review of the MIP images confirms the above findings. IMPRESSION: No evidence of pulmonary embolus. Large hiatal hernia.  Dilated air-filled esophagus. No acute cardiopulmonary disease. Electronically Signed   By: Rolm Baptise M.D.   On: 08/15/2016 19:17   Dg Chest Port 1 View  Result Date: 08/16/2016 CLINICAL DATA:  Possible aspiration. Acute onset of shortness of breath. Initial encounter. EXAM: PORTABLE CHEST 1 VIEW COMPARISON:  Chest radiograph and CTA of the chest performed 08/15/2016 FINDINGS: The lungs are hypoexpanded, with elevation  of the right hemidiaphragm. Mild vascular crowding and vascular congestion is noted. Retrocardiac opacity may reflect atelectasis or possibly mild infection. There is no definite evidence for aspiration. There is no evidence of pleural effusion or pneumothorax. The cardiomediastinal silhouette is borderline normal in size. No acute osseous abnormalities are seen. IMPRESSION: Lungs hypoexpanded, with elevation of the right hemidiaphragm. Mild vascular congestion noted. Retrocardiac opacity may reflect atelectasis or possibly mild infection. No definite evidence for aspiration. Electronically Signed   By: Garald Balding M.D.   On: 08/16/2016 01:18        Scheduled Meds: . acidophilus  1 capsule Oral Daily  . budesonide (PULMICORT) nebulizer solution  0.25 mg Nebulization BID  . citalopram  10 mg Oral Daily  . enoxaparin (LOVENOX) injection  40 mg Subcutaneous Q24H  . famotidine  20 mg Oral Daily  . fluticasone  2 spray Each Nare Daily  . guaiFENesin  1,200 mg Oral BID  . insulin aspart  0-5 Units Subcutaneous QHS  . insulin aspart  0-9 Units Subcutaneous TID WC  . ipratropium  0.5 mg Nebulization Q6H  . levalbuterol  0.63 mg Nebulization Q6H  . methylPREDNISolone (SOLU-MEDROL) injection  80 mg Intravenous Q6H  . mupirocin cream  1 application Topical BID  . pantoprazole  40 mg Oral Daily  . [START ON 08/17/2016] pneumococcal 23 valent vaccine  0.5 mL Intramuscular Tomorrow-1000  . rosuvastatin  5 mg Oral QHS  . sodium chloride HYPERTONIC  4 mL Nebulization BID   Continuous Infusions: . sodium chloride 75 mL/hr at 08/16/16 0930  . levofloxacin (LEVAQUIN) IV Stopped (08/15/16 1958)  . sodium chloride    . vancomycin Stopped (08/16/16 0220)     LOS: 1 day    Time spent: 55 mins    Su Duma, MD [facility] Pager [phone]  If 7PM-7AM, please contact night-coverage [email or web address] Password [facility] 08/16/2016, 9:36 AM

## 2016-08-16 NOTE — Evaluation (Signed)
Occupational Therapy Evaluation and Discharge Patient Details Name: Thomas Ramos MRN: [id number] DOB: 11/16/1970 Today's Date: 08/16/2016    History of Present Illness 46 y.o. male with medical history significant of cerebral palsy, chronic diastolic heart failure, diabetes mellitus, gastroesophageal reflux disease, esophageal motility disorder, esophageal stricture who presents to the ED with a three-day history of worsening productive cough of clear sputum, wheezing, congestion, shortness of breath.    Clinical Impression   Pt reports he is dependent for ADL PTA; was able to self feed with set up. Currently at baseline-dependent for ADL and mobility via hoyer lift. He was able to grip spoon with L hand for self feeding. Pt planning to d/c home alone with assist from caregivers. No further acute OT needs identified; signing off at this time. Please re-consult if needs change. Thank you for this referral.    Follow Up Recommendations  No OT follow up;Supervision/Assistance - 24 hour    Equipment Recommendations  None recommended by OT    Recommendations for Other Services       Precautions / Restrictions Precautions Precautions: Fall Restrictions Weight Bearing Restrictions: No      Mobility Bed Mobility                  Transfers                      Balance                                           ADL either performed or assessed with clinical judgement   ADL Overall ADL's : Needs assistance/impaired;At baseline Eating/Feeding: Set up;Sitting Eating/Feeding Details (indicate cue type and reason): pt able to demonstrate self feeding with set up                                    General ADL Comments: Pt dependent for ADL and transfers via hoyer lift to power chair.      Vision         Perception     Praxis      Pertinent Vitals/Pain Pain Assessment: No/denies pain     Hand Dominance Left   Extremity/Trunk  Assessment Upper Extremity Assessment Upper Extremity Assessment: RUE deficits/detail;LUE deficits/detail RUE Deficits / Details: limited ROM; contractures thorughout LUE Deficits / Details: limited finger flexion but functional   Lower Extremity Assessment Lower Extremity Assessment: Defer to PT evaluation       Communication Communication Communication: No difficulties   Cognition Arousal/Alertness: Awake/alert Behavior During Therapy: WFL for tasks assessed/performed Overall Cognitive Status: Within Functional Limits for tasks assessed                                     General Comments       Exercises     Shoulder Instructions      Home Living Family/patient expects to be discharged to:: Private residence Living Arrangements: Alone Available Help at Discharge: Personal care attendant Type of Home: Apartment       Home Layout: One level     Bathroom Shower/Tub:  (only sponge bathes)   Bathroom Toilet:  (uses depends)     Home Equipment: Wheelchair - power;Hospital bed  Prior Functioning/Environment Level of Independence: Needs assistance  Gait / Transfers Assistance Needed: hoyer lift to chair ADL's / Homemaking Assistance Needed: total A for ADls.            OT Problem List:        OT Treatment/Interventions:      OT Goals(Current goals can be found in the care plan section) Acute Rehab OT Goals Patient Stated Goal: return home OT Goal Formulation: All assessment and education complete, DC therapy  OT Frequency:     Barriers to D/C:            Co-evaluation              AM-PAC PT "6 Clicks" Daily Activity     Outcome Measure Help from another person eating meals?: A Little Help from another person taking care of personal grooming?: Total Help from another person toileting, which includes using toliet, bedpan, or urinal?: Total Help from another person bathing (including washing, rinsing, drying)?:  Total Help from another person to put on and taking off regular upper body clothing?: Total Help from another person to put on and taking off regular lower body clothing?: Total 6 Click Score: 8   End of Session Nurse Communication: Mobility status  Activity Tolerance: Patient tolerated treatment well Patient left: in chair;with call bell/phone within reach  OT Visit Diagnosis: Other abnormalities of gait and mobility (R26.89);Muscle weakness (generalized) (M62.81)                Time: 1539-1600 OT Time Calculation (min): 21 min Charges:  OT General Charges $OT Visit: 1 Procedure OT Evaluation $OT Eval Moderate Complexity: 1 Procedure G-Codes:     Akyra Bouchie A. Ulice Brilliant, M.S., OTR/L Pager: Hand 08/16/2016, 4:00 PM

## 2016-08-16 NOTE — Progress Notes (Signed)
CRITICAL VALUE ALERT  Critical Value:  Lactic Acid  Date & Time Notied:  5.8  Provider Notified: Yes  Orders Received/Actions taken: Draw Lactic acid one hour early.

## 2016-08-16 NOTE — Progress Notes (Signed)
Grandville Silos, MD made aware of respiratory panel results. No new orders received.

## 2016-08-17 DIAGNOSIS — B348 Other viral infections of unspecified site: Secondary | ICD-10-CM | POA: Diagnosis present

## 2016-08-17 LAB — LEGIONELLA PNEUMOPHILA SEROGP 1 UR AG: L. PNEUMOPHILA SEROGP 1 UR AG: NEGATIVE

## 2016-08-17 LAB — LACTIC ACID, PLASMA: Lactic Acid, Venous: 1 mmol/L (ref 0.5–1.9)

## 2016-08-17 LAB — GLUCOSE, CAPILLARY
GLUCOSE-CAPILLARY: 186 mg/dL — AB (ref 65–99)
GLUCOSE-CAPILLARY: 282 mg/dL — AB (ref 65–99)
Glucose-Capillary: 202 mg/dL — ABNORMAL HIGH (ref 65–99)
Glucose-Capillary: 143 mg/dL — ABNORMAL HIGH (ref 65–99)

## 2016-08-17 LAB — CBC WITH DIFFERENTIAL/PLATELET
Basophils Absolute: 0 10*3/uL (ref 0.0–0.1)
Basophils Relative: 0 %
Eosinophils Absolute: 0 10*3/uL (ref 0.0–0.7)
Eosinophils Relative: 0 %
HEMATOCRIT: 31.2 % — AB (ref 39.0–52.0)
Hemoglobin: 10 g/dL — ABNORMAL LOW (ref 13.0–17.0)
LYMPHS ABS: 0.9 10*3/uL (ref 0.7–4.0)
LYMPHS PCT: 13 %
MCH: 25.9 pg — ABNORMAL LOW (ref 26.0–34.0)
MCHC: 32.1 g/dL (ref 30.0–36.0)
MCV: 80.8 fL (ref 78.0–100.0)
MONO ABS: 0.3 10*3/uL (ref 0.1–1.0)
MONOS PCT: 5 %
Neutro Abs: 5.3 10*3/uL (ref 1.7–7.7)
Neutrophils Relative %: 82 %
Platelets: 134 10*3/uL — ABNORMAL LOW (ref 150–400)
RBC: 3.86 MIL/uL — ABNORMAL LOW (ref 4.22–5.81)
RDW: 13.7 % (ref 11.5–15.5)
WBC: 6.5 10*3/uL (ref 4.0–10.5)

## 2016-08-17 LAB — BASIC METABOLIC PANEL
Anion gap: 4 — ABNORMAL LOW (ref 5–15)
BUN: 12 mg/dL (ref 6–20)
CALCIUM: 8.5 mg/dL — AB (ref 8.9–10.3)
CO2: 23 mmol/L (ref 22–32)
CREATININE: 0.58 mg/dL — AB (ref 0.61–1.24)
Chloride: 110 mmol/L (ref 101–111)
GFR calc Af Amer: >60 mL/min (ref >60)
GFR calc non Af Amer: >60 mL/min (ref >60)
GLUCOSE: 191 mg/dL — AB (ref 65–99)
Potassium: 3.8 mmol/L (ref 3.5–5.1)
Sodium: 137 mmol/L (ref 135–145)

## 2016-08-17 LAB — HEMOGLOBIN A1C
Hgb A1c MFr Bld: 7.3 % — ABNORMAL HIGH (ref 4.8–5.6)
Mean Plasma Glucose: 163 mg/dL

## 2016-08-17 MED ORDER — LEVOFLOXACIN 750 MG PO TABS: 750.0000 mg | ORAL_TABLET | Freq: Every day | ORAL | Status: DC

## 2016-08-17 MED ORDER — PREDNISONE 50 MG PO TABS
60.0000 mg | ORAL_TABLET | Freq: Every day | ORAL | Status: DC
  Administered 2016-08-18: 60 mg via ORAL
  Filled 2016-08-17: qty 1

## 2016-08-17 NOTE — Care Management Note (Signed)
Case Management Note  Patient Details  Name: ESTIBEN MIZUNO MRN: [id number] Date of Birth: 20-Mar-1970  Subjective/Objective:  Admitted with Acute Resp Distress                 Action/Plan: Patient lives at home with 3 male nursing asst that provide 24 hr care for the patient;( No family support, he has a sister that is very ill); care is provided through [facility]; CM talked to [place]. PCP: Chesley Noon, MD; has private insurance with Medicare / Medicaid with prescription drug coverage; DME - electric wheelchair. No needs identified at this time. Edwin Cap will be called day of discharged for continuation of care. [facility] will provide transportation home.  Expected Discharge Date:    possibly 08/18/2016              Expected Discharge Plan:  Home/Self Care  Discharge planning Services  CM Consult  Status of Service:  In process, will continue to follow  Royston Bake, RN 08/17/2016, 11:03 AM

## 2016-08-17 NOTE — Progress Notes (Signed)
PROGRESS NOTE    Thomas Ramos  BPZ:[id number] DOB: 01/10/1971 DOA: 08/15/2016 PCP: Chesley Noon, MD    Brief Narrative:  Patient is a 46 year old gentleman history of cerebral palsy, chronic diastolic heart failure, diabetes, GERD, esophageal motility disorder present to the ED with a three-day history of worsening productive cough of clear sputum, wheezing, congestion, shortness of breath. Patient noted on admission to be thick, tachycardic, fever of 101.2, soft blood pressures and a such patient was placed in the stepdown unit.   Assessment & Plan:   Principal Problem:   Acute respiratory distress Active Problems:   SIRS (systemic inflammatory response syndrome) (HCC)   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HCC)   GERD (gastroesophageal reflux disease)   Depression with anxiety   CAP (community acquired pneumonia)   Chronic diastolic (congestive) heart failure (HCC)   OSA (obstructive sleep apnea)   Essential hypertension   Tachycardia   Pressure injury of skin   Rhinovirus infection  #1 acute respiratory distress/rhinovirus Concern for probable clinical community acquired pneumonia versus Viral URI. Marland Kitchen Patient presented with worsening cough and congestion with some associated shortness of breath and wheezing. Patient on examination with poor air movement which is improving.. Patient also noted to be tachycardic with heart rates as high as the 140s to 150s, tachypneic with respiratory rate high 20s to 30s with borderline blood pressure. Patient also noted to have a temperature 101.4. Patient with a history of cerebral palsy and a prior history of some esophageal dysmotility concern for for possible aspiration. Patient improved clinically and currently on telemetry.  Respiratory viral panel positive for rhinovirus. Sputum Gram stain and culture pending. Urine Legionella antigen pending. Urine pneumococcus antigen negative. Lactic acid level has trended down. Change IV Solu-Medrol to oral  prednisone taper. Continue Flonase, scheduled Xopenex and Atrovent nebs, Claritin, Flonase, Claritin, PPI, nasal nebulizes: Discontinue IV vancomycin and IV Levaquin,    #2 systemic inflammatory response syndrome Patient meeting criteria for systemic inflammatory response syndrome on admission with tachycardia, tachypnea, fever, elevated lactic acid level. Patient with congestion, poor air movement, cough, borderline blood pressure. Sepsis protocol has been ordered. Patient has been pancultured. Lactic acid level elevated however trending down. Urine pneumococcus antigen negative. Urine Legionella antigen pending. CT chest negative for any acute abnormalities. Patient with clinical improvement and currently afebrile. Influenza PCR negative. Respiratory viral panel positive for rhinovirus. Patient afebrile. Patient with normal white count. Blood cultures with no growth to date. Discontinue IV antibiotics. Supportive care. Follow.  #3 chronic diastolic heart failure Seems compensated. Monitor volume status closely as patient on the sepsis protocol and likely will be hydrated aggressively. IVF decreased to 75 mL per hour. Continue to hold anti-hypertensive medications.  #4 hypertension Borderline blood pressure improvement however still somewhat borderline. Continue to hold antihypertensive medications. IV fluids monitor for volume overload.  #5 gastroesophageal reflux disease Continue H2 blocker. PPI.  #6 congenital cerebral palsy Continue baclofen.  #7 depression and anxiety Continue BuSpar, Celexa.  #8 obstructive sleep apnea   DVT prophylaxis: Lovenox Code Status: Full Family Communication: Updated patient. No family present. Disposition Plan: Back to prior living environment when medically stable, improvement in blood pressure and at baseline, hopefully tomorrow.   Consultants:   None  Procedures:   Chest x-ray 08/15/2016, 08/16/2016  CT angiogram chest  08/15/2016  Antimicrobials:   IV Levaquin 08/15/2016>>>>>08/17/2016  IV vancomycin 08/15/2016>>>>08/16/2016   Subjective: Patient laying in bed. Feeling better. Congested. Asking when he can go home.   Objective: Vitals:  08/17/16 0808 08/17/16 0810 08/17/16 0812 08/17/16 0936  BP:    (!) 108/50  Pulse:      Resp:      Temp:      TempSrc:      SpO2: 92% 93% 92%   Weight:      Height:        Intake/Output Summary (Last 24 hours) at 08/17/16 0957 Last data filed at 08/17/16 0923  Gross per 24 hour  Intake           3057.5 ml  Output             1850 ml  Net           1207.5 ml   Filed Weights   08/15/16 2031 08/16/16 2006 08/17/16 0556  Weight: 72 kg (158 lb 11.7 oz) 73.8 kg (162 lb 11.2 oz) 74.4 kg (164 lb)    Examination:  General exam: Appears calm and comfortable  Respiratory system: Scattered coarse breath sounds. Fair air movement. Minimal wheezing. No crackles. Respiratory effort normal. Cardiovascular system: S1 & S2 heard, RRR. No JVD, murmurs, rubs, gallops or clicks. No pedal edema. Gastrointestinal system: Abdomen is nondistended, soft and nontender. No organomegaly or masses felt. Normal bowel sounds heard. Central nervous system: Alert and oriented. No focal neurological deficits. Extremities: Right upper extremity contractures. Contractures of lower extremities. Skin: No rashes, lesions or ulcers Psychiatry: Judgement and insight appear normal. Mood & affect appropriate.     Data Reviewed: I have personally reviewed following labs and imaging studies  CBC:  Recent Labs Lab 08/15/16 1419 08/16/16 0312 08/17/16 0312  WBC 6.7 3.9* 6.5  NEUTROABS 4.7 3.3 5.3  HGB 12.8* 10.8* 10.0*  HCT 38.6* 34.1* 31.2*  MCV 79.6 81.2 80.8  PLT 191 142* 270*   Basic Metabolic Panel:  Recent Labs Lab 08/15/16 1419 08/15/16 1741 08/16/16 0312 08/17/16 0312  NA 137  --  136 137  K 3.7  --  3.9 3.8  CL 104  --  108 110  CO2 22  --  20* 23  GLUCOSE 124*   --  233* 191*  BUN 13  --  10 12  CREATININE 0.73  --  0.70 0.58*  CALCIUM 8.8*  --  8.1* 8.5*  MG  --  1.8  --   --    GFR: Estimated Creatinine Clearance: 114 mL/min (A) (by C-G formula based on SCr of 0.58 mg/dL (L)). Liver Function Tests:  Recent Labs Lab 08/16/16 0312  AST 23  ALT 16*  ALKPHOS 102  BILITOT 0.8  PROT 6.0*  ALBUMIN 2.8*   No results for input(s): LIPASE, AMYLASE in the last 168 hours. No results for input(s): AMMONIA in the last 168 hours. Coagulation Profile:  Recent Labs Lab 08/15/16 1741  INR 1.13   Cardiac Enzymes: No results for input(s): CKTOTAL, CKMB, CKMBINDEX, TROPONINI in the last 168 hours. BNP (last 3 results) No results for input(s): PROBNP in the last 8760 hours. HbA1C:  Recent Labs  08/16/16 0818  HGBA1C 7.3*   CBG:  Recent Labs Lab 08/16/16 0907 08/16/16 1124 08/16/16 1634 08/16/16 2157 08/17/16 0739  GLUCAP 168* 279* 287* 258* 186*   Lipid Profile: No results for input(s): CHOL, HDL, LDLCALC, TRIG, CHOLHDL, LDLDIRECT in the last 72 hours. Thyroid Function Tests: No results for input(s): TSH, T4TOTAL, FREET4, T3FREE, THYROIDAB in the last 72 hours. Anemia Panel: No results for input(s): VITAMINB12, FOLATE, FERRITIN, TIBC, IRON, RETICCTPCT in the last 72 hours. Sepsis  Labs:  Recent Labs Lab 08/15/16 1741  08/15/16 2055 08/16/16 0049 08/16/16 0312 08/17/16 0312  PROCALCITON <0.10  --   --   --   --   --   LATICACIDVEN  --   < > 2.0* 5.8* 2.1* 1.0  < > = values in this interval not displayed.  Recent Results (from the past 240 hour(s))  Culture, blood (x 2)     Status: None (Preliminary result)   Collection Time: 08/15/16  5:29 PM  Result Value Ref Range Status   Specimen Description BLOOD LEFT HAND  Final   Special Requests   Final    BOTTLES DRAWN AEROBIC AND ANAEROBIC Blood Culture adequate volume   Culture NO GROWTH < 24 HOURS  Final   Report Status PENDING  Incomplete  Culture, blood (x 2)     Status:  None (Preliminary result)   Collection Time: 08/15/16  5:41 PM  Result Value Ref Range Status   Specimen Description BLOOD LEFT HAND  Final   Special Requests   Final    BOTTLES DRAWN AEROBIC ONLY Blood Culture adequate volume   Culture NO GROWTH < 24 HOURS  Final   Report Status PENDING  Incomplete  Urine culture     Status: Abnormal   Collection Time: 08/15/16  5:53 PM  Result Value Ref Range Status   Specimen Description URINE, RANDOM  Final   Special Requests NONE  Final   Culture <10,000 COLONIES/mL INSIGNIFICANT GROWTH (A)  Final   Report Status 08/16/2016 FINAL  Final  MRSA PCR Screening     Status: None   Collection Time: 08/15/16  8:36 PM  Result Value Ref Range Status   MRSA by PCR NEGATIVE NEGATIVE Final    Comment:        The GeneXpert MRSA Assay (FDA approved for NASAL specimens only), is one component of a comprehensive MRSA colonization surveillance program. It is not intended to diagnose MRSA infection nor to guide or monitor treatment for MRSA infections.   Respiratory Panel by PCR     Status: Abnormal   Collection Time: 08/15/16 11:36 PM  Result Value Ref Range Status   Adenovirus NOT DETECTED NOT DETECTED Final   Coronavirus 229E NOT DETECTED NOT DETECTED Final   Coronavirus HKU1 NOT DETECTED NOT DETECTED Final   Coronavirus NL63 NOT DETECTED NOT DETECTED Final   Coronavirus OC43 NOT DETECTED NOT DETECTED Final   Metapneumovirus NOT DETECTED NOT DETECTED Final   Rhinovirus / Enterovirus DETECTED (A) NOT DETECTED Final   Influenza A NOT DETECTED NOT DETECTED Final   Influenza B NOT DETECTED NOT DETECTED Final   Parainfluenza Virus 1 NOT DETECTED NOT DETECTED Final   Parainfluenza Virus 2 NOT DETECTED NOT DETECTED Final   Parainfluenza Virus 3 NOT DETECTED NOT DETECTED Final   Parainfluenza Virus 4 NOT DETECTED NOT DETECTED Final   Respiratory Syncytial Virus NOT DETECTED NOT DETECTED Final   Bordetella pertussis NOT DETECTED NOT DETECTED Final    Chlamydophila pneumoniae NOT DETECTED NOT DETECTED Final   Mycoplasma pneumoniae NOT DETECTED NOT DETECTED Final         Radiology Studies: Dg Chest 2 View  Result Date: 08/15/2016 CLINICAL DATA:  Cough and congestion.  History of cerebral palsy. EXAM: CHEST  2 VIEW COMPARISON:  06/16/2016; 03/21/2016; 07/27/2015; chest CT - 09/20/2015 FINDINGS: Grossly unchanged cardiac silhouette and mediastinal contours given persistently reduced lung volumes. Chronic thickening of the right paratracheal stripe, presumably secondary to prominent vasculature. There is chronic elevation the  right hemidiaphragm. Right medial basilar / retrocardiac opacities are unchanged compatible with known hiatal hernia. No new focal airspace opacities. No pleural effusion or pneumothorax. No evidence of edema. No acute osseus abnormalities. IMPRESSION: No acute cardiopulmonary disease. Specifically, no evidence of pneumonia. Electronically Signed   By: Sandi Mariscal M.D.   On: 08/15/2016 15:15   Ct Angio Chest Pe W Or Wo Contrast  Result Date: 08/15/2016 CLINICAL DATA:  Shortness of breath, tachycardia EXAM: CT ANGIOGRAPHY CHEST WITH CONTRAST TECHNIQUE: Multidetector CT imaging of the chest was performed using the standard protocol during bolus administration of intravenous contrast. Multiplanar CT image reconstructions and MIPs were obtained to evaluate the vascular anatomy. CONTRAST:  100 cc Isovue 370 IV COMPARISON:  Chest x-ray earlier today FINDINGS: Cardiovascular: No filling defects in the pulmonary arteries to suggest pulmonary emboli. Heart is normal size. Aorta is normal caliber. Mediastinum/Nodes: No mediastinal, hilar, or axillary adenopathy. Large hiatal hernia. Esophagus is dilated. Lungs/Pleura: Lungs are clear. No focal airspace opacities or suspicious nodules. No effusions. Upper Abdomen: Imaging into the upper abdomen shows no acute findings. Musculoskeletal: Chest wall soft tissues are unremarkable. No acute bony  abnormality. Review of the MIP images confirms the above findings. IMPRESSION: No evidence of pulmonary embolus. Large hiatal hernia.  Dilated air-filled esophagus. No acute cardiopulmonary disease. Electronically Signed   By: Rolm Baptise M.D.   On: 08/15/2016 19:17   Dg Chest Port 1 View  Result Date: 08/16/2016 CLINICAL DATA:  Possible aspiration. Acute onset of shortness of breath. Initial encounter. EXAM: PORTABLE CHEST 1 VIEW COMPARISON:  Chest radiograph and CTA of the chest performed 08/15/2016 FINDINGS: The lungs are hypoexpanded, with elevation of the right hemidiaphragm. Mild vascular crowding and vascular congestion is noted. Retrocardiac opacity may reflect atelectasis or possibly mild infection. There is no definite evidence for aspiration. There is no evidence of pleural effusion or pneumothorax. The cardiomediastinal silhouette is borderline normal in size. No acute osseous abnormalities are seen. IMPRESSION: Lungs hypoexpanded, with elevation of the right hemidiaphragm. Mild vascular congestion noted. Retrocardiac opacity may reflect atelectasis or possibly mild infection. No definite evidence for aspiration. Electronically Signed   By: Garald Balding M.D.   On: 08/16/2016 01:18        Scheduled Meds: . acidophilus  1 capsule Oral Daily  . budesonide (PULMICORT) nebulizer solution  0.25 mg Nebulization BID  . citalopram  10 mg Oral Daily  . enoxaparin (LOVENOX) injection  40 mg Subcutaneous Q24H  . famotidine  20 mg Oral Daily  . fluticasone  2 spray Each Nare Daily  . guaiFENesin  1,200 mg Oral BID  . insulin aspart  0-5 Units Subcutaneous QHS  . insulin aspart  0-9 Units Subcutaneous TID WC  . ipratropium  0.5 mg Nebulization TID  . levalbuterol  0.63 mg Nebulization TID  . mupirocin cream  1 application Topical BID  . pantoprazole  40 mg Oral Daily  . pneumococcal 23 valent vaccine  0.5 mL Intramuscular Tomorrow-1000  . [START ON 08/18/2016] predniSONE  60 mg Oral QAC  breakfast  . rosuvastatin  5 mg Oral QHS  . sodium chloride HYPERTONIC  4 mL Nebulization BID   Continuous Infusions: . sodium chloride 75 mL/hr at 08/16/16 2341  . sodium chloride       LOS: 2 days    Time spent: 77 mins    THOMPSON,DANIEL, MD [facility] Pager [phone]  If 7PM-7AM, please contact night-coverage [email or web address] Password [email or web address] 08/17/2016, 9:57 AM

## 2016-08-17 NOTE — Progress Notes (Signed)
Inpatient Diabetes Program Recommendations  AACE/ADA: New Consensus Statement on Inpatient Glycemic Control (2015)  Target Ranges:  Prepandial:   less than 140 mg/dL      Peak postprandial:   less than 180 mg/dL (1-2 hours)      Critically ill patients:  140 - 180 mg/dL   Lab Results  Component Value Date   GLUCAP 282 (H) 08/17/2016   HGBA1C 7.3 (H) 08/16/2016    Review of Glycemic Control Results for RODMAN, RECUPERO (MRN [id number]) as of 08/17/2016 13:30  Ref. Range 08/16/2016 11:24 08/16/2016 16:34 08/16/2016 21:57 08/17/2016 07:39 08/17/2016 11:48  Glucose-Capillary Latest Ref Range: 65 - 99 mg/dL 279 (H) 287 (H) 258 (H) 186 (H) 282 (H)   Inpatient Diabetes Program Recommendations:   Noted elevated CBGs and starting to taper steroids. Please consider: -Increase Novolog correction to 0-15 units tid  Thank you, Nani Gasser. Takiya Belmares, RN, MSN, CDE  Diabetes Coordinator Inpatient Glycemic Control Team Team Pager [phone] (8am-5pm) 08/17/2016 1:31 PM

## 2016-08-18 LAB — BASIC METABOLIC PANEL
ANION GAP: 11 (ref 5–15)
BUN: 14 mg/dL (ref 6–20)
CHLORIDE: 109 mmol/L (ref 101–111)
CO2: 21 mmol/L — AB (ref 22–32)
Calcium: 8.5 mg/dL — ABNORMAL LOW (ref 8.9–10.3)
Creatinine, Ser: 0.7 mg/dL (ref 0.61–1.24)
GFR calc non Af Amer: >60 mL/min (ref >60)
GLUCOSE: 106 mg/dL — AB (ref 65–99)
Potassium: 3.4 mmol/L — ABNORMAL LOW (ref 3.5–5.1)
Sodium: 141 mmol/L (ref 135–145)

## 2016-08-18 LAB — CBC
HEMATOCRIT: 34.3 % — AB (ref 39.0–52.0)
HEMOGLOBIN: 10.9 g/dL — AB (ref 13.0–17.0)
MCH: 25.9 pg — AB (ref 26.0–34.0)
MCHC: 31.8 g/dL (ref 30.0–36.0)
MCV: 81.5 fL (ref 78.0–100.0)
Platelets: 160 10*3/uL (ref 150–400)
RBC: 4.21 MIL/uL — AB (ref 4.22–5.81)
RDW: 14.1 % (ref 11.5–15.5)
WBC: 10.3 10*3/uL (ref 4.0–10.5)

## 2016-08-18 LAB — GLUCOSE, CAPILLARY
GLUCOSE-CAPILLARY: 210 mg/dL — AB (ref 65–99)
Glucose-Capillary: 117 mg/dL — ABNORMAL HIGH (ref 65–99)
Glucose-Capillary: 232 mg/dL — ABNORMAL HIGH (ref 65–99)

## 2016-08-18 LAB — MAGNESIUM: Magnesium: 1.8 mg/dL (ref 1.7–2.4)

## 2016-08-18 MED ORDER — ALBUTEROL SULFATE HFA 108 (90 BASE) MCG/ACT IN AERS: 2.0000 | INHALATION_SPRAY | Freq: Four times a day (QID) | RESPIRATORY_TRACT | 0 refills | Status: DC | PRN

## 2016-08-18 MED ORDER — FUROSEMIDE 10 MG/ML IJ SOLN
20.0000 mg | Freq: Once | INTRAMUSCULAR | Status: AC
  Administered 2016-08-18: 20 mg via INTRAVENOUS
  Filled 2016-08-18: qty 2

## 2016-08-18 MED ORDER — GUAIFENESIN ER 600 MG PO TB12
1200.0000 mg | ORAL_TABLET | Freq: Two times a day (BID) | ORAL | 0 refills | Status: AC
Start: 2016-08-18 — End: 2016-08-23

## 2016-08-18 MED ORDER — FLUTICASONE PROPIONATE 50 MCG/ACT NA SUSP: 1.0000 | Freq: Every day | NASAL | 2 refills | Status: DC | PRN

## 2016-08-18 MED ORDER — POTASSIUM CHLORIDE CRYS ER 20 MEQ PO TBCR
40.0000 meq | EXTENDED_RELEASE_TABLET | Freq: Once | ORAL | Status: AC
  Administered 2016-08-18: 40 meq via ORAL
  Filled 2016-08-18: qty 2

## 2016-08-18 MED ORDER — PREDNISONE 20 MG PO TABS: 20.0000 mg | ORAL_TABLET | Freq: Every day | ORAL | 0 refills | Status: DC

## 2016-08-18 MED ORDER — FUROSEMIDE 20 MG PO TABS: 20.0000 mg | ORAL_TABLET | Freq: Every morning | ORAL | Status: DC

## 2016-08-18 MED ORDER — POTASSIUM CHLORIDE 20 MEQ PO PACK
40.0000 meq | PACK | Freq: Once | ORAL | Status: AC
  Administered 2016-08-18: 40 meq via ORAL
  Filled 2016-08-18: qty 2

## 2016-08-18 NOTE — Progress Notes (Signed)
Patient to be discharged home today; CM called Edwin Cap [phone], she will arrange for someone to be at the home today to assist in his care; Home Address verified, [facility] called for transportation; [place] RN,MHA,BSN [phone]

## 2016-08-18 NOTE — Progress Notes (Addendum)
Patient very tachypneic with minimal exertion, oxygen sats fine. Says he feels that he is coughing more. Has a productive cough. Using oxygen via nasal cannula PRN.

## 2016-08-18 NOTE — Discharge Summary (Signed)
Physician Discharge Summary  [name] XBJ:[id number] DOB: 17-Jul-1970 DOA: 08/15/2016  PCP: Chesley Noon, MD  Admit date: 08/15/2016 Discharge date: 08/18/2016  Time spent: 65 minutes  Recommendations for Outpatient Follow-up:  1. Follow-up with Chesley Noon, MD in 2 weeks.   Discharge Diagnoses:  Principal Problem:   Acute respiratory distress Active Problems:   SIRS (systemic inflammatory response syndrome) (HCC)   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HCC)   GERD (gastroesophageal reflux disease)   Depression with anxiety   CAP (community acquired pneumonia)   Chronic diastolic (congestive) heart failure (HCC)   OSA (obstructive sleep apnea)   Essential hypertension   Tachycardia   Pressure injury of skin   Rhinovirus infection   Discharge Condition: Stable and improved  Diet recommendation: Heart healthy  Filed Weights   08/16/16 2006 08/17/16 0556 08/18/16 0727  Weight: 73.8 kg (162 lb 11.2 oz) 74.4 kg (164 lb) 77.4 kg (170 lb 11.2 oz)    History of present illness:  Thomas Ramos is a 46 y.o. male with medical history significant of cerebral palsy, chronic diastolic heart failure, diabetes mellitus, gastroesophageal reflux disease, esophageal motility disorder, esophageal stricture who presented to the ED with a three-day history of worsening productive cough of clear sputum, wheezing, congestion, shortness of breath. Patient stated went to [facility] on day of admission to shop however due to his worsening cough as well as shortness of breath-a shopper at [facility] ended up calling EMS and patient was brought to the ED. The ED notes on arrival EMS noted patient had a blood pressure 140/108, sats of 95% on room at, heart rate of 134, CBG of 133. Patient was given some albuterol in the IV fluid bolus and brought to the ED. Patient denied any fevers, no chills, no nausea, no vomiting, no abdominal pain, no diarrhea, no constipation, no melena, no hematemesis, no hematochezia, no  dysuria, no sick contacts, no recent travel.  ED Course: Patient was seen in the ED noted to have a lactic acid level of 2.44. Basic metabolic profile was unremarkable. CBC with a hemoglobin of 12.8 otherwise unremarkable. Chest x-ray negative for any acute abnormalities. EKG with a sinus tachycardia with a heart rate of 126. Patient noted to have heart rate go up as high as 144 with respiratory rate in the 30s and noted to have a temperature of 101.4.   Hospital Course:  #1 acute respiratory distress/rhinovirus Concern for probable clinical communityacquired pneumonia versus Viral URI. Marland Kitchen Patient presented with worsening cough and congestion with some associated shortness of breath and wheezing. Patient on examination with poor air movement which improved. Patient also noted to be tachycardic with heart rates as high as the 140s to 150s, tachypneic with respiratory rate high 20s to 30s with borderline blood pressure. Patient also noted to have a temperature 101.4. Patient with a history of cerebral palsy and a prior history of some esophageal dysmotility concern for for possible aspiration. Patient improved clinically and subsequently transferred from the stem diameter to telemetry. Urine Legionella antigen and urine pneumococcus antigen which were done were negative. CT chest is negative for any acute abnormalities. Respiratory viral panel positive for rhinovirus. Sputum Gram stain and culture pending with no growth to date.Lactic acid level has trended down. Patient was initially placed on IV steroids and subsequently transitioned to oral prednisone taper. Patient was maintained on Flonase, scheduled nebulizers, Claritin, PPI, nasal nebulizes: Broad-spectrum antibiotics of IV vancomycin and IV Levaquin was subsequently discontinued. Patient improved clinically. Patient will  be discharged home in stable and improved condition.   #2 systemic inflammatory response syndrome Patient meeting criteria for  systemic inflammatory response syndrome on admission with tachycardia, tachypnea, fever, elevated lactic acid level. Patient with congestion, poor air movement, cough, borderline blood pressure. Sepsis protocol was ordered. Patient was pancultured. Lactic acid level elevated however trended down. Urine pneumococcus antigen negative. Urine Legionella antigen negative. CT chest negative for any acute abnormalities. Patient with clinical improvement and currently afebrile. Influenza PCR negative. Respiratory viral panel positive for rhinovirus. Patient remained afebrile for the rest of the hospitalization. Patient was started initially on broad-spectrum antibiotics of IV vancomycin and IV Levaquin. As patient improved clinically and workup negative to date including CT chest IV antibiotics was subsequently discontinued. Patient improved clinically. Patient will be discharged home in stable and improved condition. Outpatient follow-up with PCP.   #3 chronic diastolic heart failure Remained compensated during the hospitalization. Patient's antihypertensive medications were held as patient was noted to be dehydrated on admission with borderline blood pressure. Patient was hydrated gently with IV fluids. Patient's diuretics will be resumed on discharge.  #4 hypertension Borderline blood pressure during the hospitalization which improved with hydration. Patient's antihypertensive medications were held. Patient's antihypertensive medications will be resumed on discharge.  #5 gastroesophageal reflux disease Continued on H2 blocker. PPI.  #6 congenital cerebral palsy Continued on baclofen.  #7 depression and anxiety Continued on home regimen of BuSpar, Celexa.  #8 obstructive sleep apnea  Procedures:  Chest x-ray 08/15/2016, 08/16/2016  CT angiogram chest 08/15/2016  Consultations:  None  Discharge Exam: Vitals:   08/18/16 0751 08/18/16 1211  BP: 138/71 (!) 146/86  Pulse: 94 86  Resp:  (!) 38 20  Temp: 98.2 F (36.8 C) 98 F (36.7 C)    General: NAD Cardiovascular: RRR Respiratory: Some scattered coarse BS.  Discharge Instructions   Discharge Instructions    Diet - low sodium heart healthy    Complete by:  As directed    Increase activity slowly    Complete by:  As directed      Current Discharge Medication List    START taking these medications   Details  guaiFENesin (MUCINEX) 600 MG 12 hr tablet Take 2 tablets (1,200 mg total) by mouth 2 (two) times daily. Take for 5 days then stop. Qty: 20 tablet, Refills: 0    predniSONE (DELTASONE) 20 MG tablet Take 1-2 tablets (20-40 mg total) by mouth daily before breakfast. Take 2 tablets (40mg ) daily x 3 days, then 1 tablet (20mg ) daily x 3 days then stop. Qty: 9 tablet, Refills: 0      CONTINUE these medications which have CHANGED   Details  albuterol (PROVENTIL HFA;VENTOLIN HFA) 108 (90 Base) MCG/ACT inhaler Inhale 2 puffs into the lungs every 6 (six) hours as needed for wheezing or shortness of breath. Use inhaler 3 times daily x 5 days, then every 6 hours as needed. Qty: 1 Inhaler, Refills: 0    fluticasone (FLONASE) 50 MCG/ACT nasal spray Place 1 spray into both nostrils daily as needed for rhinitis. Refills: 2      CONTINUE these medications which have NOT CHANGED   Details  acidophilus (RISAQUAD) CAPS capsule Take 1 capsule by mouth daily.    amLODipine (NORVASC) 10 MG tablet Take 10 mg by mouth daily.    baclofen (LIORESAL) 10 MG tablet Take 10 mg by mouth 3 (three) times daily as needed for muscle spasms.  Refills: 1    busPIRone (BUSPAR) 7.5 MG tablet Take  7.5 mg by mouth 2 (two) times daily as needed (for anxiety).     cetirizine (ZYRTEC) 10 MG tablet Take 10 mg by mouth daily.     citalopram (CELEXA) 10 MG tablet Take 10 mg by mouth daily.    furosemide (LASIX) 20 MG tablet Take 20 mg by mouth every morning.    metFORMIN (GLUCOPHAGE-XR) 500 MG 24 hr tablet Take 500 mg by mouth at  bedtime.    montelukast (SINGULAIR) 10 MG tablet Take 10 mg by mouth every morning.    nitroGLYCERIN (NITROSTAT) 0.4 MG SL tablet Place 0.4 mg under the tongue every 5 (five) minutes as needed for chest pain.  Refills: 1    ranitidine (ZANTAC) 150 MG tablet Take 150 mg by mouth 2 (two) times daily.  Refills: 3    rosuvastatin (CRESTOR) 5 MG tablet Take 5 mg by mouth at bedtime.    timolol (TIMOPTIC) 0.5 % ophthalmic solution Place 1 drop into both eyes every 12 (twelve) hours. Refills: 99    valsartan (DIOVAN) 80 MG tablet Take 80 mg by mouth daily. Refills: 3    mupirocin cream (BACTROBAN) 2 % Apply 1 application topically 2 (two) times daily. Qty: 15 g, Refills: 0    omeprazole (PRILOSEC) 20 MG capsule Take 20 mg by mouth daily.      STOP taking these medications     clindamycin (CLEOCIN) 150 MG capsule      doxycycline (VIBRAMYCIN) 100 MG capsule      HYDROcodone-acetaminophen (NORCO/VICODIN) 5-325 MG tablet        Allergies  Allergen Reactions  . Sulfamethoxazole-Trimethoprim Nausea And Vomiting  . Metronidazole Nausea And Vomiting  . Penicillins Hives, Nausea And Vomiting and Other (See Comments)    Has patient had a PCN reaction causing immediate rash, facial/tongue/throat swelling, SOB or lightheadedness with hypotension: Yes Has patient had a PCN reaction causing severe rash involving mucus membranes or skin necrosis: No Has patient had a PCN reaction that required hospitalization No Has patient had a PCN reaction occurring within the last 10 years: Yes If all of the above answers are "NO", then may proceed with Cephalosporin use.   Follow-up Information    Chesley Noon, MD. Schedule an appointment as soon as possible for a visit on 08/25/2016.   Specialty:  Family Medicine Why:  At 1:30 PM. Contact information: [place] [address] [phone]            The results of significant diagnostics from this hospitalization  (including imaging, microbiology, ancillary and laboratory) are listed below for reference.    Significant Diagnostic Studies: Dg Chest 2 View  Result Date: 08/15/2016 CLINICAL DATA:  Cough and congestion.  History of cerebral palsy. EXAM: CHEST  2 VIEW COMPARISON:  06/16/2016; 03/21/2016; 07/27/2015; chest CT - 09/20/2015 FINDINGS: Grossly unchanged cardiac silhouette and mediastinal contours given persistently reduced lung volumes. Chronic thickening of the right paratracheal stripe, presumably secondary to prominent vasculature. There is chronic elevation the right hemidiaphragm. Right medial basilar / retrocardiac opacities are unchanged compatible with known hiatal hernia. No new focal airspace opacities. No pleural effusion or pneumothorax. No evidence of edema. No acute osseus abnormalities. IMPRESSION: No acute cardiopulmonary disease. Specifically, no evidence of pneumonia. Electronically Signed   By: Sandi Mariscal M.D.   On: 08/15/2016 15:15   Ct Angio Chest Pe W Or Wo Contrast  Result Date: 08/15/2016 CLINICAL DATA:  Shortness of breath, tachycardia EXAM: CT ANGIOGRAPHY CHEST WITH CONTRAST TECHNIQUE: Multidetector CT  imaging of the chest was performed using the standard protocol during bolus administration of intravenous contrast. Multiplanar CT image reconstructions and MIPs were obtained to evaluate the vascular anatomy. CONTRAST:  100 cc Isovue 370 IV COMPARISON:  Chest x-ray earlier today FINDINGS: Cardiovascular: No filling defects in the pulmonary arteries to suggest pulmonary emboli. Heart is normal size. Aorta is normal caliber. Mediastinum/Nodes: No mediastinal, hilar, or axillary adenopathy. Large hiatal hernia. Esophagus is dilated. Lungs/Pleura: Lungs are clear. No focal airspace opacities or suspicious nodules. No effusions. Upper Abdomen: Imaging into the upper abdomen shows no acute findings. Musculoskeletal: Chest wall soft tissues are unremarkable. No acute bony abnormality. Review  of the MIP images confirms the above findings. IMPRESSION: No evidence of pulmonary embolus. Large hiatal hernia.  Dilated air-filled esophagus. No acute cardiopulmonary disease. Electronically Signed   By: Rolm Baptise M.D.   On: 08/15/2016 19:17   Dg Chest Port 1 View  Result Date: 08/16/2016 CLINICAL DATA:  Possible aspiration. Acute onset of shortness of breath. Initial encounter. EXAM: PORTABLE CHEST 1 VIEW COMPARISON:  Chest radiograph and CTA of the chest performed 08/15/2016 FINDINGS: The lungs are hypoexpanded, with elevation of the right hemidiaphragm. Mild vascular crowding and vascular congestion is noted. Retrocardiac opacity may reflect atelectasis or possibly mild infection. There is no definite evidence for aspiration. There is no evidence of pleural effusion or pneumothorax. The cardiomediastinal silhouette is borderline normal in size. No acute osseous abnormalities are seen. IMPRESSION: Lungs hypoexpanded, with elevation of the right hemidiaphragm. Mild vascular congestion noted. Retrocardiac opacity may reflect atelectasis or possibly mild infection. No definite evidence for aspiration. Electronically Signed   By: Garald Balding M.D.   On: 08/16/2016 01:18    Microbiology: Recent Results (from the past 240 hour(s))  Culture, blood (x 2)     Status: None (Preliminary result)   Collection Time: 08/15/16  5:29 PM  Result Value Ref Range Status   Specimen Description BLOOD LEFT HAND  Final   Special Requests   Final    BOTTLES DRAWN AEROBIC AND ANAEROBIC Blood Culture adequate volume   Culture NO GROWTH 3 DAYS  Final   Report Status PENDING  Incomplete  Culture, blood (x 2)     Status: None (Preliminary result)   Collection Time: 08/15/16  5:41 PM  Result Value Ref Range Status   Specimen Description BLOOD LEFT HAND  Final   Special Requests   Final    BOTTLES DRAWN AEROBIC ONLY Blood Culture adequate volume   Culture NO GROWTH 3 DAYS  Final   Report Status PENDING  Incomplete   Urine culture     Status: Abnormal   Collection Time: 08/15/16  5:53 PM  Result Value Ref Range Status   Specimen Description URINE, RANDOM  Final   Special Requests NONE  Final   Culture <10,000 COLONIES/mL INSIGNIFICANT GROWTH (A)  Final   Report Status 08/16/2016 FINAL  Final  MRSA PCR Screening     Status: None   Collection Time: 08/15/16  8:36 PM  Result Value Ref Range Status   MRSA by PCR NEGATIVE NEGATIVE Final    Comment:        The GeneXpert MRSA Assay (FDA approved for NASAL specimens only), is one component of a comprehensive MRSA colonization surveillance program. It is not intended to diagnose MRSA infection nor to guide or monitor treatment for MRSA infections.   Respiratory Panel by PCR     Status: Abnormal   Collection Time: 08/15/16 11:36 PM  Result Value Ref Range Status   Adenovirus NOT DETECTED NOT DETECTED Final   Coronavirus 229E NOT DETECTED NOT DETECTED Final   Coronavirus HKU1 NOT DETECTED NOT DETECTED Final   Coronavirus NL63 NOT DETECTED NOT DETECTED Final   Coronavirus OC43 NOT DETECTED NOT DETECTED Final   Metapneumovirus NOT DETECTED NOT DETECTED Final   Rhinovirus / Enterovirus DETECTED (A) NOT DETECTED Final   Influenza A NOT DETECTED NOT DETECTED Final   Influenza B NOT DETECTED NOT DETECTED Final   Parainfluenza Virus 1 NOT DETECTED NOT DETECTED Final   Parainfluenza Virus 2 NOT DETECTED NOT DETECTED Final   Parainfluenza Virus 3 NOT DETECTED NOT DETECTED Final   Parainfluenza Virus 4 NOT DETECTED NOT DETECTED Final   Respiratory Syncytial Virus NOT DETECTED NOT DETECTED Final   Bordetella pertussis NOT DETECTED NOT DETECTED Final   Chlamydophila pneumoniae NOT DETECTED NOT DETECTED Final   Mycoplasma pneumoniae NOT DETECTED NOT DETECTED Final     Labs: Basic Metabolic Panel:  Recent Labs Lab 08/15/16 1419 08/15/16 1741 08/16/16 0312 08/17/16 0312 08/18/16 0408 08/18/16 0930  NA 137  --  136 137 141  --   K 3.7  --  3.9 3.8  3.4*  --   CL 104  --  108 110 109  --   CO2 22  --  20* 23 21*  --   GLUCOSE 124*  --  233* 191* 106*  --   BUN 13  --  10 12 14   --   CREATININE 0.73  --  0.70 0.58* 0.70  --   CALCIUM 8.8*  --  8.1* 8.5* 8.5*  --   MG  --  1.8  --   --   --  1.8   Liver Function Tests:  Recent Labs Lab 08/16/16 0312  AST 23  ALT 16*  ALKPHOS 102  BILITOT 0.8  PROT 6.0*  ALBUMIN 2.8*   No results for input(s): LIPASE, AMYLASE in the last 168 hours. No results for input(s): AMMONIA in the last 168 hours. CBC:  Recent Labs Lab 08/15/16 1419 08/16/16 0312 08/17/16 0312 08/18/16 0408  WBC 6.7 3.9* 6.5 10.3  NEUTROABS 4.7 3.3 5.3  --   HGB 12.8* 10.8* 10.0* 10.9*  HCT 38.6* 34.1* 31.2* 34.3*  MCV 79.6 81.2 80.8 81.5  PLT 191 142* 134* 160   Cardiac Enzymes: No results for input(s): CKTOTAL, CKMB, CKMBINDEX, TROPONINI in the last 168 hours. BNP: BNP (last 3 results)  Recent Labs  10/01/15 2054 03/21/16 0025 08/15/16 2055  BNP 34.5 10.3 34.7    ProBNP (last 3 results) No results for input(s): PROBNP in the last 8760 hours.  CBG:  Recent Labs Lab 08/17/16 1148 08/17/16 1705 08/17/16 2159 08/18/16 0740 08/18/16 1205  GLUCAP 282* 202* 143* 117* 210*       Signed:  Lynsey Ange MD.  [facility] 08/18/2016, 4:24 PM

## 2016-08-20 LAB — CULTURE, BLOOD (ROUTINE X 2)
CULTURE: NO GROWTH
CULTURE: NO GROWTH
SPECIAL REQUESTS: ADEQUATE
Special Requests: ADEQUATE

## 2016-09-01 ENCOUNTER — Telehealth: Payer: Self-pay | Admitting: Gastroenterology

## 2016-09-01 ENCOUNTER — Ambulatory Visit: Payer: Medicare Other | Admitting: Gastroenterology

## 2016-09-01 NOTE — Telephone Encounter (Signed)
Rescheduled to 09-30-2016

## 2016-09-30 ENCOUNTER — Ambulatory Visit (INDEPENDENT_AMBULATORY_CARE_PROVIDER_SITE_OTHER): Payer: Medicare Other | Admitting: Gastroenterology

## 2016-09-30 ENCOUNTER — Encounter: Payer: Self-pay | Admitting: Gastroenterology

## 2016-09-30 VITALS — BP 100/80 | HR 72

## 2016-09-30 DIAGNOSIS — J189 Pneumonia, unspecified organism: Secondary | ICD-10-CM | POA: Diagnosis not present

## 2016-09-30 DIAGNOSIS — K449 Diaphragmatic hernia without obstruction or gangrene: Secondary | ICD-10-CM | POA: Diagnosis not present

## 2016-09-30 DIAGNOSIS — K222 Esophageal obstruction: Secondary | ICD-10-CM

## 2016-09-30 NOTE — Patient Instructions (Signed)
If you are age 44 or older, your body mass index should be between 23-30. Your There is no height or weight on file to calculate BMI. If this is out of the aforementioned range listed, please consider follow up with your Primary Care Provider.  If you are age 27 or younger, your body mass index should be between 19-25. Your There is no height or weight on file to calculate BMI. If this is out of the aformentioned range listed, please consider follow up with your Primary Care Provider.   Follow up in 6 months  Please elevated the head of the bed to help with reflux.  Thank you for choosing [facility]  Dr Wilfrid Lund III

## 2016-09-30 NOTE — Progress Notes (Signed)
     [facility] Progress Note  Chief Complaint: Esophageal stricture, abnormal GI tract imaging  Subjective  History:  This is a 46 year old man known to me from a visit a year ago. He had previously seen Dr. Deatra Ina for several years and carried a diagnosis of probable achalasia based on endoscopy and imaging findings. He underwent Botox treatment a few times with Dr. Deatra Ina (see my August 2017 office note for details). At that visit, he had just been hospitalized respiratory infection and there was some concern about possible aspiration as a cause. Speech pathology evaluation did not indicate concerns of aspiration.  Dominie appears to been referred back to [place] by his primary care provider Roe Coombs, PA at [facility]. Colon was hospitalized in early 09-19-2022 with a respiratory infection and sepsis. There was initially concern for pneumonia, though imaging does not seem consistent with that. Discharge summary indicates he tested positive for rhinovirus.  He ultimately recovered, and was seen in primary care follow-up. It is difficult to tell from the note, but there may be some concern that his esophageal issues needed to be revisited based on some CT findings.  Visente denies coughing while eating, dysphagia or regurgitation. He says he can eat "whatever I want". He is moving his bowels regularly and has home health daily. He has a hospital bed, but has not been in the practice of elevating the head of bed.  ROS: Cardiovascular:  no chest pain Respiratory: no dyspnea  The patient's Past Medical, Family and Social History were reviewed and are on file in the EMR.  Objective:  Med list reviewed  Vital signs in last 24 hrs: Vitals:   09/30/16 1102  BP: 100/80  Pulse: 72    Physical Exam  Wheelchair-bound, spasticity and contraction from CP  HEENT: sclera anicteric, oral mucosa moist without lesions (not well-visualized )  Neck: supple, no thyromegaly, JVD or  lymphadenopathy  Cardiac: RRR without murmurs, S1S2 heard, no peripheral edema  Pulm: clear to auscultation bilaterally, normal RR and effort noted  Abdomen: soft, no  tenderness, with active bowel sounds.Limited exam in a wheelchair   Skin; warm and dry, no jaundice or rash  Recent Labs:  Recent labs from hospital stay were reviewed  Radiologic studies: CT angiogram chest from 09/19/22 was personally reviewed. There is a large hiatal hernia as well as an esophagus that appears dilated.   @ASSESSMENTPLANBEGIN @ Assessment: Encounter Diagnoses  Name Primary?  . Esophageal stricture Yes  . Hiatal hernia   . Community acquired pneumonia, unspecified laterality    Goro reportedly has a history of esophageal stricture or perhaps hypertensive LES that was treated with Botox in the past. While that entity is most likely still present based on his imaging, he appears to have no symptoms. I also do not think that aspiration was the cause of his recent respiratory illness.   Plan: I am not currently planning to perform an EGD and Botox injection. I'm going to see Olando in about 6 months. I have asked him to call me sooner as the need arises. If he develops dysphagia, frequent regurgitation or vomiting, then the next step would most likely be upper endoscopy.   Total time 30  minutes, over half spent in counseling and coordination of care.   [name]   CC: Roe Coombs, PA ([place])

## 2016-10-02 IMAGING — CR DG CHEST 1V PORT
1 series · 1 of 1 positions shown · non-contrast
Comparison: 07/17/2015

CLINICAL DATA: Short of breath

EXAM:
PORTABLE CHEST 1 VIEW

[AP]
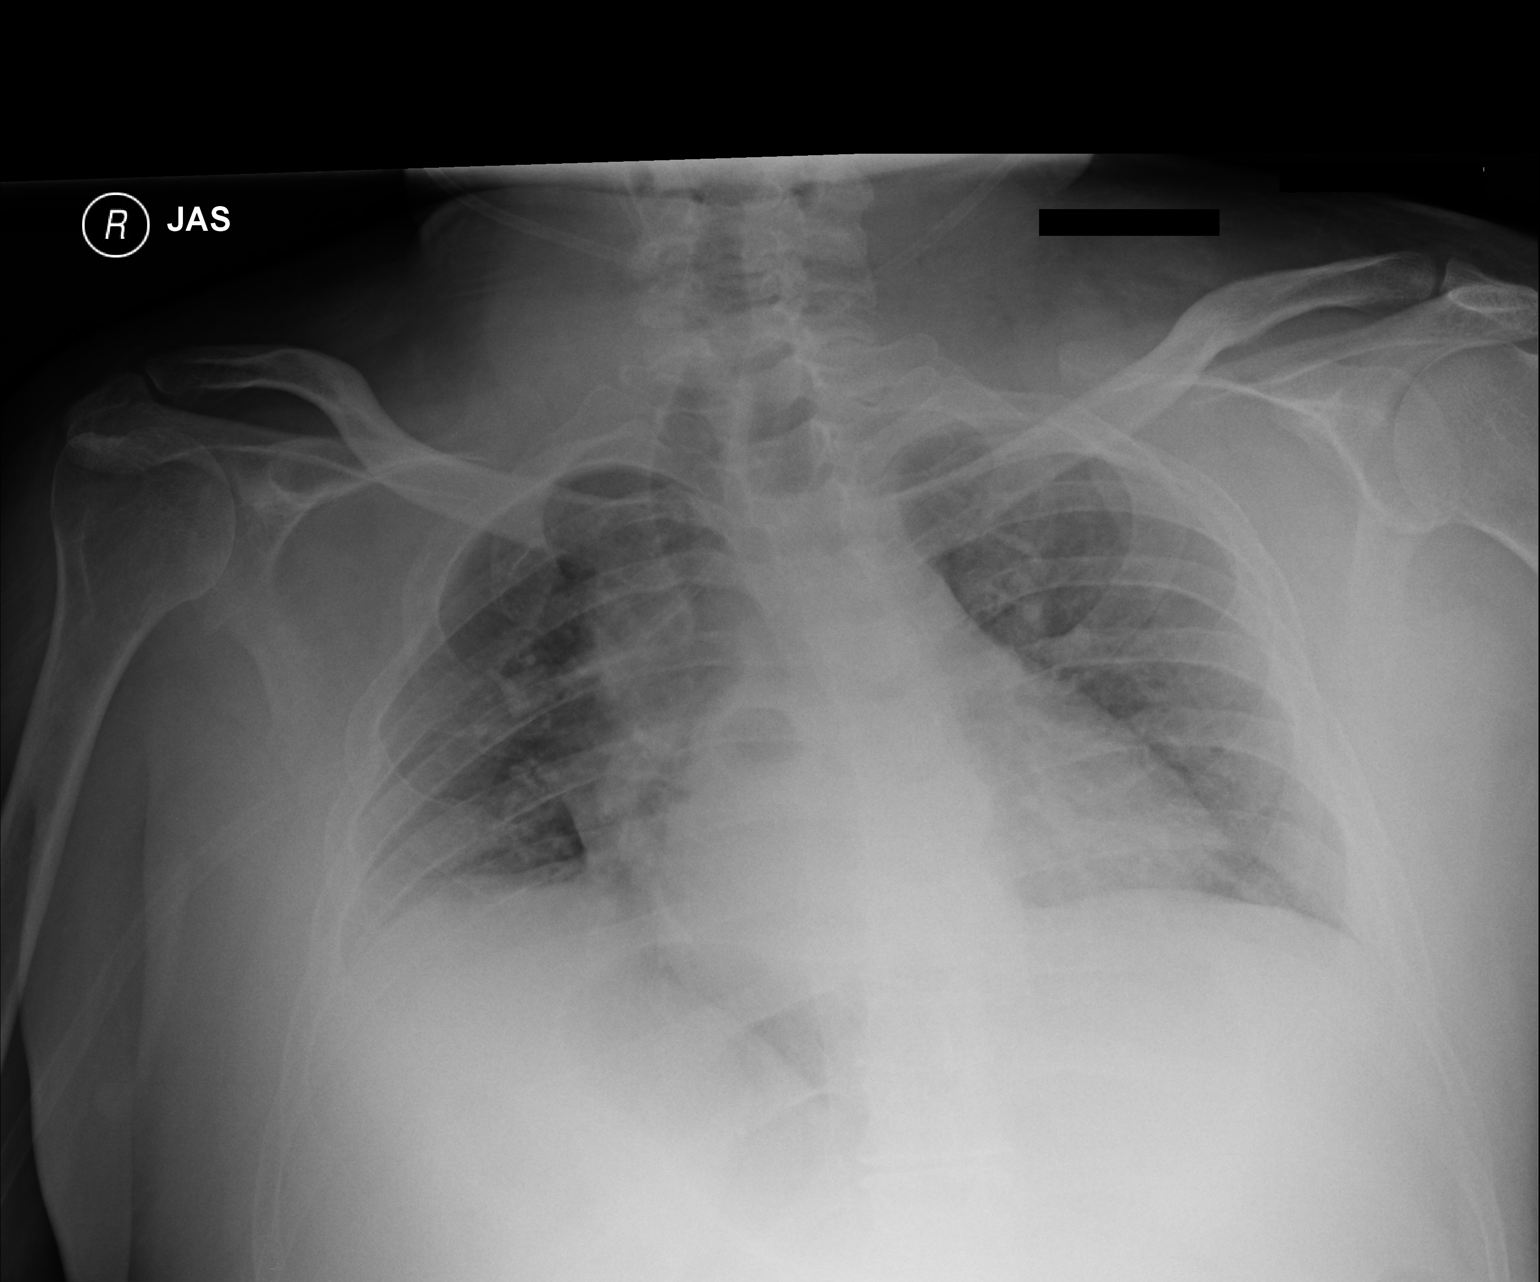

[1 of 1 positions shown; findings below may reference images not displayed]

FINDINGS: Hypoventilation with decreased lung volume. Bibasilar atelectasis
has progressed in the interval. Negative for heart failure or
effusion. Retrocardiac density may represent hiatal hernia.
IMPRESSION: Hypoventilation with bibasilar atelectasis.

## 2016-10-05 ENCOUNTER — Emergency Department (HOSPITAL_COMMUNITY)
Admission: EM | Admit: 2016-10-05 | Discharge: 2016-10-06 | Disposition: A | Discharge status: Home / Self Care | Payer: Medicare Other | Attending: Emergency Medicine | Admitting: Emergency Medicine

## 2016-10-05 ENCOUNTER — Emergency Department (HOSPITAL_COMMUNITY): Payer: Medicare Other

## 2016-10-05 ENCOUNTER — Encounter (HOSPITAL_COMMUNITY): Payer: Self-pay | Admitting: Emergency Medicine

## 2016-10-05 DIAGNOSIS — I509 Heart failure, unspecified: Secondary | ICD-10-CM | POA: Insufficient documentation

## 2016-10-05 DIAGNOSIS — J45909 Unspecified asthma, uncomplicated: Secondary | ICD-10-CM

## 2016-10-05 DIAGNOSIS — I110 Hypertensive heart disease with heart failure: Secondary | ICD-10-CM | POA: Insufficient documentation

## 2016-10-05 DIAGNOSIS — Z79899 Other long term (current) drug therapy: Secondary | ICD-10-CM | POA: Insufficient documentation

## 2016-10-05 DIAGNOSIS — Z7984 Long term (current) use of oral hypoglycemic drugs: Secondary | ICD-10-CM | POA: Diagnosis not present

## 2016-10-05 DIAGNOSIS — Z87891 Personal history of nicotine dependence: Secondary | ICD-10-CM | POA: Insufficient documentation

## 2016-10-05 DIAGNOSIS — R0602 Shortness of breath: Secondary | ICD-10-CM

## 2016-10-05 DIAGNOSIS — E119 Type 2 diabetes mellitus without complications: Secondary | ICD-10-CM | POA: Insufficient documentation

## 2016-10-05 LAB — COMPREHENSIVE METABOLIC PANEL
ALT: 19 U/L (ref 17–63)
ANION GAP: 11 (ref 5–15)
AST: 21 U/L (ref 15–41)
Albumin: 4.6 g/dL (ref 3.5–5.0)
Alkaline Phosphatase: 123 U/L (ref 38–126)
BUN: 16 mg/dL (ref 6–20)
CHLORIDE: 103 mmol/L (ref 101–111)
CO2: 25 mmol/L (ref 22–32)
Calcium: 9.6 mg/dL (ref 8.9–10.3)
Creatinine, Ser: 0.72 mg/dL (ref 0.61–1.24)
Glucose, Bld: 131 mg/dL — ABNORMAL HIGH (ref 65–99)
POTASSIUM: 4 mmol/L (ref 3.5–5.1)
SODIUM: 139 mmol/L (ref 135–145)
Total Bilirubin: 0.7 mg/dL (ref 0.3–1.2)
Total Protein: 8 g/dL (ref 6.5–8.1)

## 2016-10-05 LAB — CBC WITH DIFFERENTIAL/PLATELET
BASOS ABS: 0 10*3/uL (ref 0.0–0.1)
BASOS PCT: 0 %
Eosinophils Absolute: 0.1 10*3/uL (ref 0.0–0.7)
Eosinophils Relative: 2 %
HEMATOCRIT: 39.3 % (ref 39.0–52.0)
Hemoglobin: 13.3 g/dL (ref 13.0–17.0)
LYMPHS PCT: 28 %
Lymphs Abs: 2.2 10*3/uL (ref 0.7–4.0)
MCH: 27.3 pg (ref 26.0–34.0)
MCHC: 33.8 g/dL (ref 30.0–36.0)
MCV: 80.7 fL (ref 78.0–100.0)
Monocytes Absolute: 0.6 10*3/uL (ref 0.1–1.0)
Monocytes Relative: 8 %
NEUTROS ABS: 4.9 10*3/uL (ref 1.7–7.7)
NEUTROS PCT: 62 %
PLATELETS: 223 10*3/uL (ref 150–400)
RBC: 4.87 MIL/uL (ref 4.22–5.81)
RDW: 13.5 % (ref 11.5–15.5)
WBC: 7.9 10*3/uL (ref 4.0–10.5)

## 2016-10-05 LAB — PROTIME-INR
INR: 1.09
Prothrombin Time: 14.1 seconds (ref 11.4–15.2)

## 2016-10-05 LAB — I-STAT TROPONIN, ED
TROPONIN I, POC: 0.01 ng/mL (ref 0.00–0.08)
TROPONIN I, POC: 0 ng/mL (ref 0.00–0.08)

## 2016-10-05 LAB — D-DIMER, QUANTITATIVE: D-Dimer, Quant: <0.27 ug/mL-FEU (ref 0.00–0.50)

## 2016-10-05 LAB — BRAIN NATRIURETIC PEPTIDE: B NATRIURETIC PEPTIDE 5: 20.2 pg/mL (ref 0.0–100.0)

## 2016-10-05 MED ORDER — PREDNISONE 20 MG PO TABS: 40.0000 mg | ORAL_TABLET | Freq: Every day | ORAL | 0 refills | Status: AC

## 2016-10-05 NOTE — ED Triage Notes (Signed)
Per EMS, pt c/o SOB onset yesterday, had to use inhaler once yesterday and twice this morning without relief.

## 2016-10-05 NOTE — ED Notes (Signed)
Patient transported to X-ray 

## 2016-10-05 NOTE — ED Provider Notes (Signed)
[facility] DEPT Provider Note   CSN: [id number] Arrival date & time: 10/05/16  1310     History   Chief Complaint Chief Complaint  Patient presents with  . Shortness of Breath    HPI Thomas Ramos is a 46 y.o. male.  The history is provided by the patient and medical records.  Shortness of Breath  This is a new problem. The average episode lasts 2 days. The problem occurs continuously.The problem has been gradually improving. Associated symptoms include rhinorrhea, cough and wheezing. Pertinent negatives include no fever, no headaches, no neck pain, no sputum production, no chest pain, no syncope, no vomiting, no abdominal pain and no rash. He has tried nothing for the symptoms. The treatment provided no relief. Associated medical issues include heart failure. Associated medical issues do not include PE or CAD.    Past Medical History:  Diagnosis Date  . Anxiety   . Cerebral palsy ([place])   . Chronic diastolic (congestive) heart failure ([place])   . CP (cerebral palsy) ([place])   . Diabetes mellitus without complication (HCC)    borderline  . Environmental allergies    takes inhalers if needed  . Esophageal stricture   . GERD (gastroesophageal reflux disease)   . Hypertension   . Motility disorder, esophageal   . Pneumonia   . 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 S/P Botox injection    approx every 4 months  . Seasonal allergies     Patient Active Problem List   Diagnosis Date Noted  . Rhinovirus infection 08/17/2016  . Pressure injury of skin 08/16/2016  . Acute respiratory distress 08/15/2016  . Tachycardia 08/15/2016  . SIRS (systemic inflammatory response syndrome) ([place]) 08/15/2016  . Onychomycosis 02/20/2016  . Essential hypertension   . OSA (obstructive sleep apnea) 12/11/2015  . Chronic diastolic (congestive) heart failure ([place])   . Chronic venous insufficiency   . Depression with anxiety 09/20/2015  . CAP (community acquired pneumonia) 09/20/2015  .  Hypersomnia 07/16/2015  . Asthma 07/16/2015  . GERD (gastroesophageal reflux disease) 07/16/2015  . Hematemesis with nausea   . Dysphagia 09/16/2010  . 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 BOTOX INJECTION N/A 12/21/2012   Procedure: BOTOX INJECTION;  Surgeon: Inda Castle, MD;  Location: [facility];  Service: Endoscopy;  Laterality: N/A;  . BOTOX INJECTION N/A 06/28/2014   Procedure: BOTOX INJECTION;  Surgeon: Inda Castle, MD;  Location: [facility];  Service: Endoscopy;  Laterality: N/A;  . ESOPHAGOGASTRODUODENOSCOPY N/A 12/21/2012   Procedure: ESOPHAGOGASTRODUODENOSCOPY (EGD);  Surgeon: Inda Castle, MD;  Location: [facility];  Service: Endoscopy;  Laterality: N/A;  . ESOPHAGOGASTRODUODENOSCOPY N/A 06/28/2014   Procedure: ESOPHAGOGASTRODUODENOSCOPY (EGD);  Surgeon: Inda Castle, MD;  Location: [facility];  Service: Endoscopy;  Laterality: N/A;  . ESOPHAGUS SURGERY     stretched esophagus  . legs    . MOUTH SURGERY    . TENDON RELEASE         Home Medications    Prior to Admission medications   Medication Sig Start Date End Date Taking? Authorizing Provider  acidophilus (RISAQUAD) CAPS capsule Take 1 capsule by mouth daily.    [provider]  albuterol (PROVENTIL HFA;VENTOLIN HFA) 108 (90 Base) MCG/ACT inhaler Inhale 2 puffs into the lungs every 6 (six) hours as needed for wheezing or shortness of breath. Use inhaler 3 times daily x 5 days, then every 6 hours as needed. 08/18/16  Eugenie Filler, MD  amLODipine (NORVASC) 10 MG tablet Take 10 mg by mouth daily.    [provider]  cetirizine (ZYRTEC) 10 MG tablet Take 10 mg by mouth daily.     [provider]  fluticasone (FLONASE) 50 MCG/ACT nasal spray Place 1 spray into both nostrils daily as needed for rhinitis. 08/18/16   Eugenie Filler, MD  furosemide (LASIX) 20 MG tablet Take 20 mg by mouth every morning. 05/06/16    [provider]  metFORMIN (GLUCOPHAGE-XR) 500 MG 24 hr tablet Take 500 mg by mouth at bedtime. 08/06/16 08/06/17  [provider]  montelukast (SINGULAIR) 10 MG tablet Take 10 mg by mouth every morning. 08/12/16   [provider]  nitroGLYCERIN (NITROSTAT) 0.4 MG SL tablet Place 0.4 mg under the tongue every 5 (five) minutes as needed for chest pain.     [provider]  rosuvastatin (CRESTOR) 5 MG tablet Take 5 mg by mouth at bedtime.    [provider]  timolol (TIMOPTIC) 0.5 % ophthalmic solution Place 1 drop into both eyes every 12 (twelve) hours. 07/31/16   [provider]  valsartan (DIOVAN) 80 MG tablet Take 80 mg by mouth daily.    [provider]    Family History Family History  Problem Relation Age of Onset  . Hypertension Father   . Lung cancer Father   . Diabetes Mother     Social History Social History  Substance Use Topics  . Smoking status: Former Research scientist (life sciences)  . Smokeless tobacco: Former Systems developer     Comment: smoked one time  . Alcohol use 0.0 oz/week     Comment: 2 beers every other Friday and Saturday     Allergies   Sulfamethoxazole-trimethoprim; Metronidazole; and Penicillins   Review of Systems Review of Systems  Constitutional: Negative for chills, diaphoresis, fatigue and fever.  HENT: Positive for congestion and rhinorrhea.   Eyes: Negative for visual disturbance.  Respiratory: Positive for cough, shortness of breath and wheezing. Negative for sputum production and chest tightness.   Cardiovascular: Negative for chest pain and syncope.  Gastrointestinal: Negative for abdominal pain, constipation, diarrhea, nausea and vomiting.  Genitourinary: Negative for flank pain and frequency.  Musculoskeletal: Negative for back pain, neck pain and neck stiffness.  Skin: Negative for rash and wound.  Neurological: Negative for light-headedness and headaches.  Psychiatric/Behavioral: Negative for confusion.  All  other systems reviewed and are negative.    Physical Exam Updated Vital Signs BP 105/60 (BP Location: Left Wrist)   Pulse 94   Temp 98.5 F (36.9 C) (Oral)   Resp 20   SpO2 96%   Physical Exam  Constitutional: He is oriented to person, place, and time. He appears well-developed and well-nourished. No distress.  HENT:  Head: Normocephalic and atraumatic.  Mouth/Throat: No oropharyngeal exudate.  Eyes: Pupils are equal, round, and reactive to light. Conjunctivae and EOM are normal.  Neck: Normal range of motion. Neck supple.  Cardiovascular: Normal rate and regular rhythm.   No murmur heard. Pulmonary/Chest: Effort normal and breath sounds normal. No stridor. No tachypnea. No respiratory distress. He has no decreased breath sounds. He has no wheezes. He has no rhonchi. He has no rales. He exhibits no tenderness.  Abdominal: Soft. There is no tenderness. There is no rebound.  Musculoskeletal: He exhibits no edema.  Neurological: He is alert and oriented to person, place, and time. No cranial nerve deficit or sensory deficit. He exhibits abnormal muscle tone (contractures at  baseline per pt).  Skin: Skin is warm and dry. Capillary refill takes less than 2 seconds. He is not diaphoretic. No erythema. No pallor.  Psychiatric: He has a normal mood and affect.  Nursing note and vitals reviewed.    ED Treatments / Results  Labs (all labs ordered are listed, but only abnormal results are displayed) Labs Reviewed  COMPREHENSIVE METABOLIC PANEL - Abnormal; Notable for the following:       Result Value   Glucose, Bld 131 (*)    All other components within normal limits  BRAIN NATRIURETIC PEPTIDE  CBC WITH DIFFERENTIAL/PLATELET  D-DIMER, QUANTITATIVE (NOT AT [facility])  PROTIME-INR  CBC WITH DIFFERENTIAL/PLATELET  I-STAT TROPONIN, ED  I-STAT TROPONIN, ED    EKG  EKG Interpretation  Date/Time:  Monday October 05 2016 13:54:48 EDT Ventricular Rate:  95 PR Interval:    QRS  Duration: 93 QT Interval:  342 QTC Calculation: 430 R Axis:   101 Text Interpretation:  Sinus rhythm Right axis deviation Borderline T wave abnormalities When compared to prior, no significant changes seen.  No STEMI Confirmed by Antony Blackbird [phone]) on 10/05/2016 7:09:55 PM       Radiology Dg Chest 2 View  Result Date: 10/05/2016 CLINICAL DATA:  Shortness of breath chest pain EXAM: CHEST  2 VIEW COMPARISON:  08/16/2016 FINDINGS: Rotated patient. Markedly low lung volume. No focal infiltrate or effusion. Stable cardiomediastinal silhouette. No pneumothorax. Colonic inter positioning on the right. Degenerative changes of the spine. IMPRESSION: Low lung volumes.  No acute infiltrate or edema. Electronically Signed   By: Donavan Foil M.D.   On: 10/05/2016 17:54    Procedures Procedures (including critical care time)  Medications Ordered in ED Medications - No data to display   Initial Impression / Assessment and Plan / ED Course  I have reviewed the triage vital signs and the nursing notes.  Pertinent labs & imaging results that were available during my care of the patient were reviewed by me and considered in my medical decision making (see chart for details).     Thomas Ramos is a 45 y.o. male with a past medical history significant for CHF, esophageal stricture, hypertension, GERD, cerebral palsy and quadriplegia, diabetes, and anxiety who presents with shortness of breath. Patient reports that for the last 2 days, he has had shortness of breath. He says he used an inhaler yesterday that helped but today the end appeared on inhaler did not help. He denies any chest pain at home but says well on the waiting room, he had sharp chest tightness in his central mid chest. He denies it radiating. Denies nausea, vomiting, or diaphoresis. He reports that his shortness breath has slightly improved. He reports that his legs are slightly swollen bilaterally with no leg pain. He denies any  abdominal pain or back pain. He denies a recent dramatic injuries aside from a fall months ago. He denies any fevers, chills, or cough. He denies hemoptysis. He denies any bowel symptoms or urinary symptoms. He denies any other complaints.  On exam, chest is nontender. Lungs are clear. Abdomen nontender. Patient has a contraction strictures in extremities. Patient has bilateral pitting edema in legs. No evidence of cellulitis seen. Back nontender. No other significant abnormality on exam.  Patient will have workup to look for occult infection as well as fluid overload given his leg swelling and history of CHF. He will have labs, troponin, BNP, and EKG.  Anticipate reassessment following workup to determine disposition.  11:55  PM Diagnostic testing results are seen above. Troponin negative 2, d-dimer negative, doubt PE. CBC unremarkable. BNP nonelevated. CMP reassuring. Chest x-ray shows no evidence of pneumonia or infiltrate. No edema. EKG showed no STEMI.  Given patient's breathing trouble similar to prior asthma exacerbations, patient will be given prescription for prednisone as this helped in the past. Patient has not had significant wheezing here and did not need a breathing treatment. Given patient's reassuring lab workup,. Of observation, and vital signs, feel patient is stable for discharge home.  Patient given instructions to follow-up with PCP in the next several days as well as strict return precautions for any new or worsened symptoms. Patient was understanding of the plan of care and had no other questions or concerns. Patient discharged in good condition with improvement in symptoms.    Final Clinical Impressions(s) / ED Diagnoses   Final diagnoses:  SOB (shortness of breath)  Mild asthma, unspecified whether complicated, unspecified whether persistent    New Prescriptions New Prescriptions   PREDNISONE (DELTASONE) 20 MG TABLET    Take 2 tablets (40 mg total) by mouth daily.     Clinical Impression: 1. SOB (shortness of breath)   2. Mild asthma, unspecified whether complicated, unspecified whether persistent     Disposition: Discharge  Condition: Good  I have discussed the results, Dx and Tx plan with the pt(& family if present). He/she/they expressed understanding and agree(s) with the plan. Discharge instructions discussed at great length. Strict return precautions discussed and pt &/or family have verbalized understanding of the instructions. No further questions at time of discharge.    New Prescriptions   PREDNISONE (DELTASONE) 20 MG TABLET    Take 2 tablets (40 mg total) by mouth daily.    Follow Up: Chesley Noon, MD [place] [address] [phone]  Schedule an appointment as soon as possible for a visit    [facility] [place] [phone] [place] [phone]  If symptoms worsen     Dynesha Woolen, Gwenyth Allegra, MD 10/06/16 1122

## 2016-10-05 NOTE — Discharge Instructions (Signed)
Your workup today did not show evidence of pneumonia or other infection. It did not show evidence of blood clots or any cardiac cause of your shortness of breath. We suspected may be related to your asthma flaring up. You are getting a prescription for steroids for several days to help with your breathing. If symptoms change or worsen, please return to the nearest emergency department. Please follow-up with your primary care physician in the next few days for reevaluation.

## 2016-11-09 ENCOUNTER — Encounter (HOSPITAL_COMMUNITY): Payer: Self-pay | Admitting: Emergency Medicine

## 2016-11-09 ENCOUNTER — Emergency Department (HOSPITAL_COMMUNITY)
Admission: EM | Admit: 2016-11-09 | Discharge: 2016-11-09 | Disposition: A | Discharge status: Home / Self Care | Payer: Medicare Other | Attending: Emergency Medicine | Admitting: Emergency Medicine

## 2016-11-09 ENCOUNTER — Emergency Department (HOSPITAL_COMMUNITY): Payer: Medicare Other

## 2016-11-09 DIAGNOSIS — G809 Cerebral palsy, unspecified: Secondary | ICD-10-CM | POA: Insufficient documentation

## 2016-11-09 DIAGNOSIS — I5032 Chronic diastolic (congestive) heart failure: Secondary | ICD-10-CM | POA: Insufficient documentation

## 2016-11-09 DIAGNOSIS — I110 Hypertensive heart disease with heart failure: Secondary | ICD-10-CM | POA: Insufficient documentation

## 2016-11-09 DIAGNOSIS — Z79899 Other long term (current) drug therapy: Secondary | ICD-10-CM | POA: Diagnosis not present

## 2016-11-09 DIAGNOSIS — R55 Syncope and collapse: Secondary | ICD-10-CM | POA: Insufficient documentation

## 2016-11-09 DIAGNOSIS — Z87891 Personal history of nicotine dependence: Secondary | ICD-10-CM | POA: Diagnosis not present

## 2016-11-09 LAB — BASIC METABOLIC PANEL
ANION GAP: 8 (ref 5–15)
BUN: 13 mg/dL (ref 6–20)
CALCIUM: 9.4 mg/dL (ref 8.9–10.3)
CO2: 27 mmol/L (ref 22–32)
Chloride: 103 mmol/L (ref 101–111)
Creatinine, Ser: 0.7 mg/dL (ref 0.61–1.24)
GFR calc Af Amer: >60 mL/min (ref >60)
GLUCOSE: 116 mg/dL — AB (ref 65–99)
POTASSIUM: 3.9 mmol/L (ref 3.5–5.1)
SODIUM: 138 mmol/L (ref 135–145)

## 2016-11-09 LAB — CBC WITH DIFFERENTIAL/PLATELET
BASOS ABS: 0 10*3/uL (ref 0.0–0.1)
BASOS PCT: 0 %
Eosinophils Absolute: 0.1 10*3/uL (ref 0.0–0.7)
Eosinophils Relative: 1 %
HEMATOCRIT: 38.4 % — AB (ref 39.0–52.0)
HEMOGLOBIN: 13.1 g/dL (ref 13.0–17.0)
LYMPHS PCT: 30 %
Lymphs Abs: 2.2 10*3/uL (ref 0.7–4.0)
MCH: 27.2 pg (ref 26.0–34.0)
MCHC: 34.1 g/dL (ref 30.0–36.0)
MCV: 79.7 fL (ref 78.0–100.0)
MONO ABS: 0.6 10*3/uL (ref 0.1–1.0)
Monocytes Relative: 9 %
NEUTROS ABS: 4.5 10*3/uL (ref 1.7–7.7)
NEUTROS PCT: 60 %
Platelets: 252 10*3/uL (ref 150–400)
RBC: 4.82 MIL/uL (ref 4.22–5.81)
RDW: 13.1 % (ref 11.5–15.5)
WBC: 7.5 10*3/uL (ref 4.0–10.5)

## 2016-11-09 LAB — D-DIMER, QUANTITATIVE: D-Dimer, Quant: <0.27 ug/mL-FEU (ref 0.00–0.50)

## 2016-11-09 LAB — I-STAT TROPONIN, ED: TROPONIN I, POC: 0 ng/mL (ref 0.00–0.08)

## 2016-11-09 LAB — BRAIN NATRIURETIC PEPTIDE: B Natriuretic Peptide: 7.5 pg/mL (ref 0.0–100.0)

## 2016-11-09 MED ORDER — SODIUM CHLORIDE 0.9 % IV BOLUS (SEPSIS)
500.0000 mL | Freq: Once | INTRAVENOUS | Status: AC
  Administered 2016-11-09: 500 mL via INTRAVENOUS

## 2016-11-09 NOTE — ED Provider Notes (Signed)
[facility] DEPT Provider Note   CSN: [id number] Arrival date & time: 11/09/16  1406     History   Chief Complaint Chief Complaint  Patient presents with  . Near Syncope    HPI Thomas Ramos is a 46 y.o. male.  Pt is a 46yo male with hx of cerebral palsy, quadraplegic spinal paralysis, DM, HTN, CHF, who presents with a near syncopal episode.  He was sitting in his wheelchair at work and became dizzy and lightheaded.  No NV, no diarrhea, no CP.  +mild SOB.  No recent cough, colds, or other illnesses.  No recent falls or head trauma.  No palpitations.  He said that the air conditioner wasn't working in his office and he thinks that he got overheated.  No hx of similar symptoms.  No headache.      Past Medical History:  Diagnosis Date  . Anxiety   . Cerebral palsy ([place])   . Chronic diastolic (congestive) heart failure ([place])   . CP (cerebral palsy) ([place])   . Diabetes mellitus without complication (HCC)    borderline  . Environmental allergies    takes inhalers if needed  . Esophageal stricture   . GERD (gastroesophageal reflux disease)   . Hypertension   . Motility disorder, esophageal   . Pneumonia   . 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 S/P Botox injection    approx every 4 months  . Seasonal allergies     Patient Active Problem List   Diagnosis Date Noted  . Rhinovirus infection 08/17/2016  . Pressure injury of skin 08/16/2016  . Acute respiratory distress 08/15/2016  . Tachycardia 08/15/2016  . SIRS (systemic inflammatory response syndrome) ([place]) 08/15/2016  . Onychomycosis 02/20/2016  . Essential hypertension   . OSA (obstructive sleep apnea) 12/11/2015  . Chronic diastolic (congestive) heart failure ([place])   . Chronic venous insufficiency   . Depression with anxiety 09/20/2015  . CAP (community acquired pneumonia) 09/20/2015  . Hypersomnia 07/16/2015  . Asthma 07/16/2015  . GERD (gastroesophageal reflux disease) 07/16/2015  . Hematemesis with  nausea   . Dysphagia 09/16/2010  . 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 BOTOX INJECTION N/A 12/21/2012   Procedure: BOTOX INJECTION;  Surgeon: Inda Castle, MD;  Location: [facility];  Service: Endoscopy;  Laterality: N/A;  . BOTOX INJECTION N/A 06/28/2014   Procedure: BOTOX INJECTION;  Surgeon: Inda Castle, MD;  Location: [facility];  Service: Endoscopy;  Laterality: N/A;  . ESOPHAGOGASTRODUODENOSCOPY N/A 12/21/2012   Procedure: ESOPHAGOGASTRODUODENOSCOPY (EGD);  Surgeon: Inda Castle, MD;  Location: [facility];  Service: Endoscopy;  Laterality: N/A;  . ESOPHAGOGASTRODUODENOSCOPY N/A 06/28/2014   Procedure: ESOPHAGOGASTRODUODENOSCOPY (EGD);  Surgeon: Inda Castle, MD;  Location: [facility];  Service: Endoscopy;  Laterality: N/A;  . ESOPHAGUS SURGERY     stretched esophagus  . legs    . MOUTH SURGERY    . TENDON RELEASE         Home Medications    Prior to Admission medications   Medication Sig Start Date End Date Taking? Authorizing Provider  albuterol (PROVENTIL HFA;VENTOLIN HFA) 108 (90 Base) MCG/ACT inhaler Inhale 2 puffs into the lungs every 6 (six) hours as needed for wheezing or shortness of breath. Use inhaler 3 times daily x 5 days, then every 6 hours as needed. 08/18/16  Yes Eugenie Filler, MD  amLODipine (NORVASC) 10 MG tablet Take 10 mg by mouth daily.  Yes [provider]  cetirizine (ZYRTEC) 10 MG tablet Take 10 mg by mouth daily.    Yes [provider]  fluticasone (FLONASE) 50 MCG/ACT nasal spray Place 1 spray into both nostrils daily as needed for rhinitis. 08/18/16  Yes Eugenie Filler, MD  furosemide (LASIX) 20 MG tablet Take 20 mg by mouth every morning. 05/06/16  Yes [provider]  metFORMIN (GLUCOPHAGE-XR) 500 MG 24 hr tablet Take 500 mg by mouth at bedtime. 08/06/16 08/06/17 Yes [provider]  montelukast (SINGULAIR) 10 MG tablet Take 10 mg  by mouth every morning. 08/12/16  Yes [provider]  rosuvastatin (CRESTOR) 5 MG tablet Take 5 mg by mouth at bedtime.   Yes [provider]  timolol (TIMOPTIC) 0.5 % ophthalmic solution Place 1 drop into both eyes every 12 (twelve) hours. 07/31/16  Yes [provider]  valsartan (DIOVAN) 80 MG tablet Take 80 mg by mouth daily.   Yes [provider]  nitroGLYCERIN (NITROSTAT) 0.4 MG SL tablet Place 0.4 mg under the tongue every 5 (five) minutes as needed for chest pain.     [provider]    Family History Family History  Problem Relation Age of Onset  . Hypertension Father   . Lung cancer Father   . Diabetes Mother     Social History Social History  Substance Use Topics  . Smoking status: Former Research scientist (life sciences)  . Smokeless tobacco: Former Systems developer     Comment: smoked one time  . Alcohol use 0.0 oz/week     Comment: 2 beers every other Friday and Saturday     Allergies   Sulfamethoxazole-trimethoprim; Metronidazole; and Penicillins   Review of Systems Review of Systems  Constitutional: Positive for fatigue. Negative for chills, diaphoresis and fever.  HENT: Negative for congestion, rhinorrhea and sneezing.   Eyes: Negative.   Respiratory: Positive for shortness of breath. Negative for cough and chest tightness.   Cardiovascular: Negative for chest pain and leg swelling.  Gastrointestinal: Negative for abdominal pain, blood in stool, diarrhea, nausea and vomiting.  Genitourinary: Negative for difficulty urinating, flank pain, frequency and hematuria.  Musculoskeletal: Negative for arthralgias and back pain.  Skin: Negative for rash.  Neurological: Positive for light-headedness. Negative for speech difficulty, weakness, numbness and headaches.     Physical Exam Updated Vital Signs BP 136/90 (BP Location: Left Arm)   Pulse (!) 104   Temp 98.1 F (36.7 C) (Oral)   Resp 18   SpO2 98%   Physical Exam  Constitutional: He is oriented to  person, place, and time. He appears well-developed and well-nourished.  HENT:  Head: Normocephalic and atraumatic.  Eyes: Pupils are equal, round, and reactive to light.  Neck: Normal range of motion. Neck supple.  Cardiovascular: Normal rate, regular rhythm and normal heart sounds.   Pulmonary/Chest: Effort normal and breath sounds normal. No respiratory distress. He has no wheezes. He has no rales. He exhibits no tenderness.  Few scattered rhonchi  Abdominal: Soft. Bowel sounds are normal. There is no tenderness. There is no rebound and no guarding.  Musculoskeletal: Normal range of motion. He exhibits edema.  Lymphadenopathy:    He has no cervical adenopathy.  Neurological: He is alert and oriented to person, place, and time.  Has paralysis with contractures, more on the right side.  Has some movement of left arm.  At baseline per patient.  Has some dysarthria which he says is also at baseline.  Skin: Skin is warm and dry. No  rash noted.  Psychiatric: He has a normal mood and affect.     ED Treatments / Results  Labs (all labs ordered are listed, but only abnormal results are displayed) Labs Reviewed  BASIC METABOLIC PANEL - Abnormal; Notable for the following:       Result Value   Glucose, Bld 116 (*)    All other components within normal limits  CBC WITH DIFFERENTIAL/PLATELET - Abnormal; Notable for the following:    HCT 38.4 (*)    All other components within normal limits  D-DIMER, QUANTITATIVE (NOT AT [facility])  BRAIN NATRIURETIC PEPTIDE  I-STAT TROPONIN, ED    EKG  EKG Interpretation  Date/Time:  Monday November 09 2016 14:22:37 EDT Ventricular Rate:  111 PR Interval:    QRS Duration: 93 QT Interval:  318 QTC Calculation: 433 R Axis:   110 Text Interpretation:  Sinus tachycardia Atrial premature complex Right axis deviation Borderline T abnormalities, anterior leads SINCE LAST TRACING HEART RATE HAS INCREASED Confirmed by Malvin Johns [phone]) on 11/09/2016 4:09:27  PM       Radiology Dg Chest 2 View  Result Date: 11/09/2016 CLINICAL DATA:  Near-syncope. History CHF diabetes. History of cerebral palsy. EXAM: CHEST  2 VIEW COMPARISON:  Chest radiograph October 05, 2016 FINDINGS: Cardiomediastinal silhouette is unremarkable for this low inspiratory examination with crowded vasculature markings. The lungs are clear without pleural effusions or focal consolidations. Trachea projects midline and there is no pneumothorax. Included soft tissue planes and osseous structures are non-suspicious. IMPRESSION: Stable examination: Low lung volumes, no acute cardiopulmonary process. Electronically Signed   By: Elon Alas M.D.   On: 11/09/2016 17:04    Procedures Procedures (including critical care time)  Medications Ordered in ED Medications  sodium chloride 0.9 % bolus 500 mL (0 mLs Intravenous Stopped 11/09/16 1837)     Initial Impression / Assessment and Plan / ED Course  I have reviewed the triage vital signs and the nursing notes.  Pertinent labs & imaging results that were available during my care of the patient were reviewed by me and considered in my medical decision making (see chart for details).     Patient presents with a near-syncopal episode. He felt that it was related to getting overheated. He is completely asymptomatic now. There is a question of some shortness of breath earlier but is currently denying any shortness of breath. His chest x-ray is clear without evidence of pneumonia or pulmonary edema. He has no hypoxia. No suggestions of pulmonary embolus. His d-dimer is normal. There is no suggestions of fluid overload. His other electrolytes are unremarkable. He has no symptoms that would be more consistent with acute coronary syndrome. No ischemic changes on EKG. He was given a small amount of IV fluids in the ED. He is currently asymptomatic. He is tolerating oral fluids. He was discharged home in good condition. He was encouraged to  follow-up with his primary care physician. Return precautions were given. He has some mild tachycardia although his heart rate seems to be similar to recent values from prior ED and office visits.  Final Clinical Impressions(s) / ED Diagnoses   Final diagnoses:  Near syncope    New Prescriptions New Prescriptions   No medications on file     Malvin Johns, MD 11/09/16 2057

## 2016-11-09 NOTE — ED Notes (Signed)
Bed: MY11 Expected date:  Expected time:  Means of arrival:  Comments: EMS-forgot

## 2016-11-09 NOTE — ED Triage Notes (Signed)
Patient here from work with complaints of near syncope. 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

## 2017-01-21 ENCOUNTER — Emergency Department (HOSPITAL_BASED_OUTPATIENT_CLINIC_OR_DEPARTMENT_OTHER)
Admit: 2017-01-21 | Discharge: 2017-01-21 | Disposition: A | Discharge status: Home / Self Care | Payer: Medicare Other | Attending: Emergency Medicine | Admitting: Emergency Medicine

## 2017-01-21 ENCOUNTER — Emergency Department (HOSPITAL_COMMUNITY)
Admission: EM | Admit: 2017-01-21 | Discharge: 2017-01-21 | Disposition: A | Discharge status: Home / Self Care | Payer: Medicare Other | Attending: Emergency Medicine | Admitting: Emergency Medicine

## 2017-01-21 ENCOUNTER — Encounter (HOSPITAL_COMMUNITY): Payer: Self-pay | Admitting: Emergency Medicine

## 2017-01-21 ENCOUNTER — Other Ambulatory Visit (HOSPITAL_COMMUNITY): Payer: Self-pay | Admitting: Vascular Surgery

## 2017-01-21 ENCOUNTER — Encounter (HOSPITAL_COMMUNITY): Payer: Medicare Other

## 2017-01-21 DIAGNOSIS — M7989 Other specified soft tissue disorders: Secondary | ICD-10-CM

## 2017-01-21 DIAGNOSIS — I509 Heart failure, unspecified: Secondary | ICD-10-CM | POA: Diagnosis not present

## 2017-01-21 DIAGNOSIS — R2242 Localized swelling, mass and lump, left lower limb: Secondary | ICD-10-CM | POA: Diagnosis not present

## 2017-01-21 DIAGNOSIS — M79604 Pain in right leg: Secondary | ICD-10-CM | POA: Diagnosis not present

## 2017-01-21 DIAGNOSIS — G8250 Quadriplegia, unspecified: Secondary | ICD-10-CM | POA: Insufficient documentation

## 2017-01-21 DIAGNOSIS — I110 Hypertensive heart disease with heart failure: Secondary | ICD-10-CM | POA: Diagnosis not present

## 2017-01-21 DIAGNOSIS — G809 Cerebral palsy, unspecified: Secondary | ICD-10-CM | POA: Insufficient documentation

## 2017-01-21 DIAGNOSIS — J45909 Unspecified asthma, uncomplicated: Secondary | ICD-10-CM | POA: Diagnosis not present

## 2017-01-21 DIAGNOSIS — Z87891 Personal history of nicotine dependence: Secondary | ICD-10-CM | POA: Insufficient documentation

## 2017-01-21 DIAGNOSIS — Z79899 Other long term (current) drug therapy: Secondary | ICD-10-CM | POA: Insufficient documentation

## 2017-01-21 DIAGNOSIS — E119 Type 2 diabetes mellitus without complications: Secondary | ICD-10-CM | POA: Insufficient documentation

## 2017-01-21 DIAGNOSIS — R2243 Localized swelling, mass and lump, lower limb, bilateral: Secondary | ICD-10-CM | POA: Insufficient documentation

## 2017-01-21 DIAGNOSIS — M79605 Pain in left leg: Secondary | ICD-10-CM | POA: Insufficient documentation

## 2017-01-21 LAB — I-STAT CHEM 8, ED
BUN: 15 mg/dL (ref 6–20)
CALCIUM ION: 1.14 mmol/L — AB (ref 1.15–1.40)
CHLORIDE: 101 mmol/L (ref 101–111)
Creatinine, Ser: 0.7 mg/dL (ref 0.61–1.24)
GLUCOSE: 148 mg/dL — AB (ref 65–99)
HCT: 37 % — ABNORMAL LOW (ref 39.0–52.0)
HEMOGLOBIN: 12.6 g/dL — AB (ref 13.0–17.0)
Potassium: 3.7 mmol/L (ref 3.5–5.1)
SODIUM: 138 mmol/L (ref 135–145)
TCO2: 26 mmol/L (ref 22–32)

## 2017-01-21 LAB — CBC WITH DIFFERENTIAL/PLATELET
Basophils Absolute: 0 10*3/uL (ref 0.0–0.1)
Basophils Relative: 0 %
EOS ABS: 0.1 10*3/uL (ref 0.0–0.7)
EOS PCT: 2 %
HCT: 38.6 % — ABNORMAL LOW (ref 39.0–52.0)
Hemoglobin: 12.6 g/dL — ABNORMAL LOW (ref 13.0–17.0)
LYMPHS ABS: 2 10*3/uL (ref 0.7–4.0)
Lymphocytes Relative: 31 %
MCH: 26.4 pg (ref 26.0–34.0)
MCHC: 32.6 g/dL (ref 30.0–36.0)
MCV: 80.8 fL (ref 78.0–100.0)
MONO ABS: 0.5 10*3/uL (ref 0.1–1.0)
Monocytes Relative: 7 %
Neutro Abs: 3.8 10*3/uL (ref 1.7–7.7)
Neutrophils Relative %: 60 %
PLATELETS: 202 10*3/uL (ref 150–400)
RBC: 4.78 MIL/uL (ref 4.22–5.81)
RDW: 12.9 % (ref 11.5–15.5)
WBC: 6.4 10*3/uL (ref 4.0–10.5)

## 2017-01-21 MED ORDER — ACETAMINOPHEN 325 MG PO TABS
650.0000 mg | ORAL_TABLET | Freq: Once | ORAL | Status: AC
  Administered 2017-01-21: 650 mg via ORAL
  Filled 2017-01-21: qty 2

## 2017-01-21 NOTE — ED Provider Notes (Signed)
[facility] EMERGENCY DEPARTMENT Provider Note   CSN: [id number] Arrival date & time: 01/21/17  1355     History   Chief Complaint Chief Complaint  Patient presents with  . Leg Pain    HPI Thomas Ramos is a 46 y.o. male.  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  History is a bit difficult to obtain due to patient's underlying cerebral palsy.  Patient is mostly wheelchair-bound.  He report having increasing pain and swelling to both of his legs left greater than right.  This has been ongoing for the past 3 days.  Pain is sharp and achy nothing makes it better or worse.  He reached out to his PCP through the phone who recommended patient to come to the ER to rule out blood clot.  He denies any prior history of blood clot.  He did report being admitted for leg swelling secondary to infection in the past and was treated with clindamycin.  No specific treatment try at home.  Denies having any active chest pain, shortness of breath, productive cough or back pain.  No recent injury  Past Medical History:  Diagnosis Date  . Anxiety   . Cerebral palsy ([place])   . Chronic diastolic (congestive) heart failure ([place])   . CP (cerebral palsy) ([place])   . Diabetes mellitus without complication (HCC)    borderline  . Environmental allergies    takes inhalers if needed  . Esophageal stricture   . GERD (gastroesophageal reflux disease)   . Hypertension   . Motility disorder, esophageal   . Pneumonia   . 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 S/P Botox injection    approx every 4 months  . Seasonal allergies     Patient Active Problem List   Diagnosis Date Noted  . Rhinovirus infection 08/17/2016  . Pressure injury of skin 08/16/2016  . Acute respiratory distress 08/15/2016  . Tachycardia 08/15/2016  . SIRS (systemic inflammatory response syndrome) ([place])  08/15/2016  . Onychomycosis 02/20/2016  . Essential hypertension   . OSA (obstructive sleep apnea) 12/11/2015  . Chronic diastolic (congestive) heart failure ([place])   . Chronic venous insufficiency   . Depression with anxiety 09/20/2015  . CAP (community acquired pneumonia) 09/20/2015  . Hypersomnia 07/16/2015  . Asthma 07/16/2015  . GERD (gastroesophageal reflux disease) 07/16/2015  . Hematemesis with nausea   . Dysphagia 09/16/2010  . 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 ESOPHAGUS SURGERY     stretched esophagus  . legs    . MOUTH SURGERY    . TENDON RELEASE         Home Medications    Prior to Admission medications   Medication Sig Start Date End Date Taking? Authorizing Provider  albuterol (PROVENTIL HFA;VENTOLIN HFA) 108 (90 Base) MCG/ACT inhaler Inhale 2 puffs into the lungs every 6 (six) hours as needed for wheezing or shortness of breath. Use inhaler 3 times daily x 5 days, then every 6 hours as needed. 08/18/16   Eugenie Filler, MD  amLODipine (NORVASC) 10 MG tablet Take 10 mg by mouth daily.    [provider]  cetirizine (ZYRTEC) 10 MG tablet Take 10 mg by mouth daily.     [provider]  fluticasone (FLONASE) 50 MCG/ACT nasal spray Place 1 spray into both nostrils daily as needed  for rhinitis. 08/18/16   Eugenie Filler, MD  furosemide (LASIX) 20 MG tablet Take 20 mg by mouth every morning. 05/06/16   [provider]  metFORMIN (GLUCOPHAGE-XR) 500 MG 24 hr tablet Take 500 mg by mouth at bedtime. 08/06/16 08/06/17  [provider]  montelukast (SINGULAIR) 10 MG tablet Take 10 mg by mouth every morning. 08/12/16   [provider]  nitroGLYCERIN (NITROSTAT) 0.4 MG SL tablet Place 0.4 mg under the tongue every 5 (five) minutes as needed for chest pain.     [provider]  rosuvastatin (CRESTOR) 5 MG tablet Take 5 mg by mouth at bedtime.    [provider]  timolol (TIMOPTIC) 0.5 % ophthalmic solution Place 1 drop into both eyes every 12 (twelve) hours. 07/31/16   [provider]  valsartan (DIOVAN) 80 MG tablet Take 80 mg by mouth daily.    [provider]    Family History Family History  Problem Relation Age of Onset  . Hypertension Father   . Lung cancer Father   . Diabetes Mother     Social History Social History   Tobacco Use  . Smoking status: Former Research scientist (life sciences)  . Smokeless tobacco: Former Systems developer  . Tobacco comment: smoked one time  Substance Use Topics  . Alcohol use: Yes    Alcohol/week: 0.0 oz    Comment: 2 beers every other Friday and Saturday  . Drug use: No     Allergies   Sulfamethoxazole-trimethoprim; Metronidazole; and Penicillins   Review of Systems Review of Systems  All other systems reviewed and are negative.    Physical Exam Updated Vital Signs BP (!) 128/94   Pulse 80   Temp 98.7 F (37.1 C) (Oral)   Resp 19   SpO2 97%   Physical Exam  Constitutional:  Patient in no acute discomfort.  Cardiovascular: Normal rate and regular rhythm.  Pulmonary/Chest: Effort normal and breath sounds normal.  Abdominal: Soft. There is no tenderness.  Musculoskeletal:  Atrophy noted to lower extremities bilaterally.  Mild edema noted to bilateral lower extreme left greater than right.  No skin erythema or warmth noted.  No obvious deformity.  Intact distal pedal pulses.  Nursing note and vitals reviewed.    ED Treatments / Results  Labs (all labs ordered are listed, but only abnormal results are displayed) Labs Reviewed  CBC WITH DIFFERENTIAL/PLATELET  I-STAT CHEM 8, ED    EKG  EKG Interpretation None       Radiology No results found.  Procedures Procedures (including critical care time)  Medications Ordered in ED Medications  acetaminophen (TYLENOL) tablet 650 mg (650 mg Oral Given 01/21/17 1439)     Initial Impression / Assessment and Plan / ED Course  I  have reviewed the triage vital signs and the nursing notes.  Pertinent labs & imaging results that were available during my care of the patient were reviewed by me and considered in my medical decision making (see chart for details).     BP (!) 128/94   Pulse 80   Temp 98.7 F (37.1 C) (Oral)   Resp 19   SpO2 97%    Final Clinical Impressions(s) / ED Diagnoses   Final diagnoses:  None    ED Discharge Orders    None     2:21 PM Patient with cerebral palsy, mostly wheelchair-bound, quadriplegia here with complaints of leg swelling and pain.  On exam, no evidence to suggest cellulitis causing his leg pain.  No  recent injury concerning for acute fractures or dislocation.  Will obtain DVT study.  Tylenol given for pain.  3:50 PM Pt sign out to oncoming provider who will f/u on DVT study, reassess and determine disposition.     Domenic Moras, PA-C 01/21/17 1551    Veryl Speak, MD 01/21/17 [id number]

## 2017-01-21 NOTE — ED Notes (Signed)
REPAGED [facility] FOR TRANSPORT; REMINDED THEM THAT I CALLED @ 5PM!

## 2017-01-21 NOTE — ED Notes (Signed)
Contacted [facility] for tx back home to [address]

## 2017-01-21 NOTE — ED Provider Notes (Signed)
4:01 PM Handoff from [employer] at shift change. Patient with h/o CP, paralysis -- presents for r/o DVT. No reported clinical s/s of PE.   DVT study is technically difficult due to patient's contractures and not all areas were apparently well visualized.   4:50 PM patient seen by myself and updated on results.  I reexamined his lower extremities.  No significant swelling noted.  Certainly no redness or edema.  Clinically, patient does not have signs and symptoms of deep venous thrombosis.  He denies any chest pains or shortness of breath at this time.  Vital signs are normal.  Patient is not hypoxic.  Do not suspect PE.  BP 117/79   Pulse 75   Temp 98.7 F (37.1 C) (Oral)   Resp 19   SpO2 95%   Will d/c to home with PCP follow-up.     Carlisle Cater, PA-C 01/21/17 1651    Veryl Speak, MD 01/22/17 2253

## 2017-01-21 NOTE — ED Notes (Signed)
Pt reports legs are normally swollen but is worse today.

## 2017-01-21 NOTE — Discharge Instructions (Signed)
Please read and follow all provided instructions.  Your diagnoses today include:  1. Leg swelling     Tests performed today include:  Blood counts and electrolytes -no significant issues  Ultrasound of your legs -no blood clots seen, however exam was difficult due to contractures in the all areas of vasculature was visualized. You do not have any obvious sign of blood clot on exam today.  Vital signs. See below for your results today.   Medications prescribed:   None  Take any prescribed medications only as directed.  Home care instructions:  Follow any educational materials contained in this packet.  BE VERY CAREFUL not to take multiple medicines containing Tylenol (also called acetaminophen). Doing so can lead to an overdose which can damage your liver and cause liver failure and possibly death.   Follow-up instructions: Please follow-up with your primary care provider in the next 5 days for further evaluation of your symptoms.   Return instructions:   Return if you develop fever, chest pain, shortness of breath.    Please return to the Emergency Department if you experience worsening symptoms.   Please return if you have any other emergent concerns.  Additional Information:  Your vital signs today were: BP 117/79    Pulse 75    Temp 98.7 F (37.1 C) (Oral)    Resp 19    SpO2 95%  If your blood pressure (BP) was elevated above 135/85 this visit, please have this repeated by your doctor within one month. --------------

## 2017-01-21 NOTE — ED Notes (Signed)
Patient given turkey sandwich and coke.  

## 2017-01-21 NOTE — ED Notes (Signed)
Pt to vascular. Changed brief. Small BM.

## 2017-01-21 NOTE — Progress Notes (Signed)
VASCULAR LAB PRELIMINARY  PRELIMINARY  PRELIMINARY  PRELIMINARY  Bilateral lower extremity venous duplex completed.    Preliminary report:  Technically limited and inconclusive due to contractions of the hip, knee, and ankle bilaterally. No obvius evidence of DVT, superficial. thrombosis, or Baker's cyst in the area able to be visualized.  Jaxie Racanelli, RVS 01/21/2017, 3:37 PM

## 2017-01-21 NOTE — ED Triage Notes (Signed)
Pt from home c/o bilateral leg pain and swelling. Reports left leg worse than other. Called MD and sent here for concerns of blood clot.

## 2017-01-21 NOTE — ED Notes (Signed)
Pt stable for discharge pt cannot sign signature pad, states understanding follow up instructions

## 2017-02-04 ENCOUNTER — Other Ambulatory Visit: Payer: Self-pay

## 2017-02-04 ENCOUNTER — Encounter (HOSPITAL_COMMUNITY): Payer: Self-pay | Admitting: Emergency Medicine

## 2017-02-04 ENCOUNTER — Emergency Department (HOSPITAL_COMMUNITY)
Admission: EM | Admit: 2017-02-04 | Discharge: 2017-02-05 | Disposition: A | Discharge status: Home / Self Care | Payer: Medicare Other | Attending: Emergency Medicine | Admitting: Emergency Medicine

## 2017-02-04 DIAGNOSIS — I5032 Chronic diastolic (congestive) heart failure: Secondary | ICD-10-CM | POA: Insufficient documentation

## 2017-02-04 DIAGNOSIS — Z87891 Personal history of nicotine dependence: Secondary | ICD-10-CM | POA: Insufficient documentation

## 2017-02-04 DIAGNOSIS — I110 Hypertensive heart disease with heart failure: Secondary | ICD-10-CM | POA: Diagnosis not present

## 2017-02-04 DIAGNOSIS — E119 Type 2 diabetes mellitus without complications: Secondary | ICD-10-CM | POA: Diagnosis not present

## 2017-02-04 DIAGNOSIS — Z79899 Other long term (current) drug therapy: Secondary | ICD-10-CM | POA: Diagnosis not present

## 2017-02-04 DIAGNOSIS — J45909 Unspecified asthma, uncomplicated: Secondary | ICD-10-CM | POA: Insufficient documentation

## 2017-02-04 DIAGNOSIS — R1084 Generalized abdominal pain: Secondary | ICD-10-CM | POA: Diagnosis not present

## 2017-02-04 DIAGNOSIS — K209 Esophagitis, unspecified without bleeding: Secondary | ICD-10-CM

## 2017-02-04 DIAGNOSIS — J302 Other seasonal allergic rhinitis: Secondary | ICD-10-CM | POA: Diagnosis not present

## 2017-02-04 DIAGNOSIS — Z7984 Long term (current) use of oral hypoglycemic drugs: Secondary | ICD-10-CM | POA: Insufficient documentation

## 2017-02-04 DIAGNOSIS — R112 Nausea with vomiting, unspecified: Secondary | ICD-10-CM

## 2017-02-04 LAB — CBC WITH DIFFERENTIAL/PLATELET
BASOS PCT: 0 %
Basophils Absolute: 0 10*3/uL (ref 0.0–0.1)
Eosinophils Absolute: 0 10*3/uL (ref 0.0–0.7)
Eosinophils Relative: 0 %
HEMATOCRIT: 40.4 % (ref 39.0–52.0)
HEMOGLOBIN: 13.6 g/dL (ref 13.0–17.0)
Lymphocytes Relative: 14 %
Lymphs Abs: 1.5 10*3/uL (ref 0.7–4.0)
MCH: 27.3 pg (ref 26.0–34.0)
MCHC: 33.7 g/dL (ref 30.0–36.0)
MCV: 81 fL (ref 78.0–100.0)
MONO ABS: 0.7 10*3/uL (ref 0.1–1.0)
MONOS PCT: 7 %
NEUTROS ABS: 8.4 10*3/uL — AB (ref 1.7–7.7)
Neutrophils Relative %: 79 %
Platelets: 219 10*3/uL (ref 150–400)
RBC: 4.99 MIL/uL (ref 4.22–5.81)
RDW: 13.1 % (ref 11.5–15.5)
WBC: 10.6 10*3/uL — ABNORMAL HIGH (ref 4.0–10.5)

## 2017-02-04 MED ORDER — ONDANSETRON HCL 4 MG/2ML IJ SOLN
4.0000 mg | Freq: Once | INTRAMUSCULAR | Status: AC
Start: 2017-02-04 — End: 2017-02-05
  Administered 2017-02-05: 4 mg via INTRAVENOUS
  Filled 2017-02-04: qty 2

## 2017-02-04 MED ORDER — SODIUM CHLORIDE 0.9 % IV BOLUS (SEPSIS)
1000.0000 mL | Freq: Once | INTRAVENOUS | Status: AC
  Administered 2017-02-05: 1000 mL via INTRAVENOUS

## 2017-02-04 NOTE — ED Triage Notes (Signed)
[place]/o abdominal pain with nausea and vomiting, no diarrhea,  Pt reports that she has been having LLQ abdominal pain and tenderness with nausea since this morning, and has started vomiting an hor ago.

## 2017-02-04 NOTE — ED Provider Notes (Signed)
[facility] DEPT Provider Note   CSN: [id number] Arrival date & time: 02/04/17  2300     History   Chief Complaint Chief Complaint  Patient presents with  . Abdominal Pain    HPI Thomas Ramos is a 46 y.o. male.  Patient presents to the ER by ambulance for evaluation of abdominal pain with nausea and vomiting.  Patient reports predominantly left-sided abdominal pain that started earlier today.  Prior to arrival in the ER, patient started having nausea and vomiting which prompted him to call EMS.  He feels that the vomiting alleviated some of his pain but he is still feeling nausea currently.  He has not had a fever.      Past Medical History:  Diagnosis Date  . Anxiety   . Cerebral palsy ([place])   . Chronic diastolic (congestive) heart failure ([place])   . CP (cerebral palsy) ([place])   . Diabetes mellitus without complication (HCC)    borderline  . Environmental allergies    takes inhalers if needed  . Esophageal stricture   . GERD (gastroesophageal reflux disease)   . Hypertension   . Motility disorder, esophageal   . Pneumonia   . 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[place])   . S/P Botox injection    approx every 4 months  . Seasonal allergies     Patient Active Problem List   Diagnosis Date Noted  . Rhinovirus infection 08/17/2016  . Pressure injury of skin 08/16/2016  . Acute respiratory distress 08/15/2016  . Tachycardia 08/15/2016  . SIRS (systemic inflammatory response syndrome) ([place]) 08/15/2016  . Onychomycosis 02/20/2016  . Essential hypertension   . OSA (obstructive sleep apnea) 12/11/2015  . Chronic diastolic (congestive) heart failure ([place])   . Chronic venous insufficiency   . Depression with anxiety 09/20/2015  . CAP (community acquired pneumonia) 09/20/2015  . Hypersomnia 07/16/2015  . Asthma 07/16/2015  . GERD (gastroesophageal reflux disease) 07/16/2015  . Hematemesis with nausea   . Dysphagia 09/16/2010  . Congenital  cerebral palsy ([place]) 09/16/2010    Past Surgical History:  Procedure Laterality Date  . BOTOX INJECTION N/A 12/21/2012   Procedure: BOTOX INJECTION;  Surgeon: Inda Castle, MD;  Location: [facility];  Service: Endoscopy;  Laterality: N/A;  . BOTOX INJECTION N/A 06/28/2014   Procedure: BOTOX INJECTION;  Surgeon: Inda Castle, MD;  Location: [facility];  Service: Endoscopy;  Laterality: N/A;  . ESOPHAGOGASTRODUODENOSCOPY N/A 12/21/2012   Procedure: ESOPHAGOGASTRODUODENOSCOPY (EGD);  Surgeon: Inda Castle, MD;  Location: [facility];  Service: Endoscopy;  Laterality: N/A;  . ESOPHAGOGASTRODUODENOSCOPY N/A 06/28/2014   Procedure: ESOPHAGOGASTRODUODENOSCOPY (EGD);  Surgeon: Inda Castle, MD;  Location: [facility];  Service: Endoscopy;  Laterality: N/A;  . ESOPHAGUS SURGERY     stretched esophagus  . legs    . MOUTH SURGERY    . TENDON RELEASE         Home Medications    Prior to Admission medications   Medication Sig Start Date End Date Taking? Authorizing Provider  albuterol (PROVENTIL HFA;VENTOLIN HFA) 108 (90 Base) MCG/ACT inhaler Inhale 2 puffs into the lungs every 6 (six) hours as needed for wheezing or shortness of breath. Use inhaler 3 times daily x 5 days, then every 6 hours as needed. 08/18/16  Yes Eugenie Filler, MD  amLODipine (NORVASC) 10 MG tablet Take 10 mg by mouth daily.   Yes [provider]  cetirizine (ZYRTEC) 10 MG tablet Take 10 mg by mouth daily.  Yes [provider]  fluticasone (FLONASE) 50 MCG/ACT nasal spray Place 1 spray into both nostrils daily as needed for rhinitis. 08/18/16  Yes Eugenie Filler, MD  furosemide (LASIX) 20 MG tablet Take 20 mg by mouth every morning. 05/06/16  Yes [provider]  metFORMIN (GLUCOPHAGE-XR) 500 MG 24 hr tablet Take 500 mg by mouth at bedtime. 08/06/16 08/06/17 Yes [provider]  montelukast (SINGULAIR) 10 MG tablet Take 10 mg by mouth every morning. 08/12/16  Yes [provider]  nitroGLYCERIN (NITROSTAT) 0.4 MG SL tablet Place 0.4 mg under the tongue every 5 (five) minutes as needed for chest pain.    Yes [provider]  rosuvastatin (CRESTOR) 5 MG tablet Take 5 mg by mouth at bedtime.   Yes [provider]  timolol (TIMOPTIC) 0.5 % ophthalmic solution Place 1 drop into both eyes every 12 (twelve) hours. 07/31/16  Yes [provider]  valsartan (DIOVAN) 80 MG tablet Take 80 mg by mouth daily.   Yes [provider]  ondansetron (ZOFRAN) 4 MG tablet Take 1 tablet (4 mg total) by mouth every 6 (six) hours. 02/05/17   Orpah Greek, MD  pantoprazole (PROTONIX) 20 MG tablet Take 1 tablet (20 mg total) by mouth daily. 02/05/17   Orpah Greek, MD    Family History Family History  Problem Relation Age of Onset  . Hypertension Father   . Lung cancer Father   . Diabetes Mother     Social History Social History   Tobacco Use  . Smoking status: Former Research scientist (life sciences)  . Smokeless tobacco: Former Systems developer  . Tobacco comment: smoked one time  Substance Use Topics  . Alcohol use: Yes    Alcohol/week: 0.0 oz    Comment: 2 beers every other Friday and Saturday  . Drug use: No     Allergies   Sulfamethoxazole-trimethoprim; Metronidazole; and Penicillins   Review of Systems Review of Systems  Gastrointestinal: Positive for abdominal pain, nausea and vomiting.  All other systems reviewed and are negative.    Physical Exam Updated Vital Signs BP 131/73   Pulse 85   Temp (!) 97.2 F (36.2 C) (Oral)   Resp 16   SpO2 97%   Physical Exam  Constitutional: He is oriented to person, place, and time. He appears well-developed and well-nourished. No distress.  HENT:  Head: Normocephalic and atraumatic.  Right Ear: Hearing normal.  Left Ear: Hearing normal.  Nose: Nose normal.  Mouth/Throat: Oropharynx is clear and moist and mucous membranes are normal.  Eyes: Conjunctivae and EOM are normal. Pupils are  equal, round, and reactive to light.  Neck: Normal range of motion. Neck supple.  Cardiovascular: Regular rhythm, S1 normal and S2 normal. Exam reveals no gallop and no friction rub.  No murmur heard. Pulmonary/Chest: Effort normal and breath sounds normal. No respiratory distress. He exhibits no tenderness.  Abdominal: Soft. Normal appearance and bowel sounds are normal. There is no hepatosplenomegaly. There is generalized tenderness. There is no rebound, no guarding, no tenderness at McBurney's point and negative Murphy's sign. No hernia.  Musculoskeletal: Normal range of motion.  Neurological: He is alert and oriented to person, place, and time. He has normal strength. No cranial nerve deficit or sensory deficit. Coordination normal. GCS eye subscore is 4. GCS verbal subscore is 5. GCS motor subscore is 6.  Skin: Skin is warm, dry and intact. No rash noted. No cyanosis.  Psychiatric: He has a normal mood and affect. His speech  is normal and behavior is normal. Thought content normal.  Nursing note and vitals reviewed.    ED Treatments / Results  Labs (all labs ordered are listed, but only abnormal results are displayed) Labs Reviewed  CBC WITH DIFFERENTIAL/PLATELET - Abnormal; Notable for the following components:      Result Value   WBC 10.6 (*)    Neutro Abs 8.4 (*)    All other components within normal limits  COMPREHENSIVE METABOLIC PANEL - Abnormal; Notable for the following components:   Glucose, Bld 202 (*)    All other components within normal limits  URINALYSIS, ROUTINE W REFLEX MICROSCOPIC - Abnormal; Notable for the following components:   Glucose, UA 50 (*)    Ketones, ur 5 (*)    Squamous Epithelial / LPF 0-5 (*)    All other components within normal limits  LIPASE, BLOOD    EKG  EKG Interpretation None       Radiology Ct Abdomen Pelvis W Contrast  Result Date: 02/05/2017 CLINICAL DATA:  Left lower quadrant pain and nausea and vomiting for 1 day. History  of hypertension, diabetes, gastroesophageal reflux, and quadriplegic. EXAM: CT ABDOMEN AND PELVIS WITH CONTRAST TECHNIQUE: Multidetector CT imaging of the abdomen and pelvis was performed using the standard protocol following bolus administration of intravenous contrast. CONTRAST:  100 mL Isovue-300 COMPARISON:  None. FINDINGS: Lower chest: Emphysematous changes in the lung bases. Mild atelectasis or fibrosis. Large esophageal hiatal hernia. Only the very distal most esophagus is included within the field of view. The visualized esophageal wall appears to be thickened. This may represent esophagitis. Hepatobiliary: No focal liver abnormality is seen. No gallstones, gallbladder wall thickening, or biliary dilatation. Pancreas: Unremarkable. No pancreatic ductal dilatation or surrounding inflammatory changes. Spleen: Normal in size without focal abnormality. Adrenals/Urinary Tract: No adrenal gland nodules. Kidneys are symmetrical. subcentimeter cysts in both kidneys. No hydronephrosis or hydroureter. The bladder wall is diffusely thickened. This could represent cystitis or hypertrophy due to outlet obstruction. No abnormal distention. Stomach/Bowel: Stomach and small bowel are decompressed. Diffusely stool-filled colon. Large amount of stool demonstrated in the rectosigmoid colon. Stool impaction should be considered. Vascular/Lymphatic: No significant vascular findings are present. No enlarged abdominal or pelvic lymph nodes. Reproductive: Prostate is unremarkable. Other: No abdominal wall hernia or abnormality. No abdominopelvic ascites. Musculoskeletal: Degenerative changes in the lumbar spine. Probable dysplastic changes in the left hip. No destructive bone lesions. IMPRESSION: 1. Large esophageal hiatal hernia with esophageal wall thickening in the visualized distal esophagus. This likely represents esophagitis. 2. Diffuse thickening of bladder wall could represent cystitis or bladder wall hypertrophy. 3. Large  amount of stool in the rectosigmoid colon suggest stool impaction. Electronically Signed   By: Lucienne Capers M.D.   On: 02/05/2017 02:06    Procedures Procedures (including critical care time)  Medications Ordered in ED Medications  sodium chloride 0.9 % bolus 1,000 mL (1,000 mLs Intravenous New Bag/Given 02/05/17 0033)  ondansetron (ZOFRAN) injection 4 mg (4 mg Intravenous Given 02/05/17 0033)  iopamidol (ISOVUE-300) 61 % injection 100 mL (100 mLs Intravenous Contrast Given 02/05/17 0145)     Initial Impression / Assessment and Plan / ED Course  I have reviewed the triage vital signs and the nursing notes.  Pertinent labs & imaging results that were available during my care of the patient were reviewed by me and considered in my medical decision making (see chart for details).     Patient presents to the emergency department for evaluation of abdominal pain with  nausea and vomiting.  Patient reports that he has been experiencing some left-sided abdominal pain for the course of the day, but acutely had onset of emesis tonight.  Examination revealed diffuse abdominal tenderness, no focality and no signs of peritonitis.  Lab work was unrevealing, essentially normal.  Patient underwent CT scan to further evaluate.  Patient does have a large hiatal hernia and esophagitis.  He also has a large stool burden.  Bladder wall thickening is felt to be chronic, as urinalysis does not suggest infection.  Final Clinical Impressions(s) / ED Diagnoses   Final diagnoses:  Generalized abdominal pain  Non-intractable vomiting with nausea, unspecified vomiting type  Esophagitis    ED Discharge Orders        Ordered    pantoprazole (PROTONIX) 20 MG tablet  Daily     02/05/17 0213    ondansetron (ZOFRAN) 4 MG tablet  Every 6 hours     02/05/17 0213       Orpah Greek, MD 02/05/17 [phone]

## 2017-02-05 ENCOUNTER — Emergency Department (HOSPITAL_COMMUNITY): Payer: Medicare Other

## 2017-02-05 ENCOUNTER — Encounter (HOSPITAL_COMMUNITY): Payer: Self-pay

## 2017-02-05 DIAGNOSIS — R112 Nausea with vomiting, unspecified: Secondary | ICD-10-CM | POA: Diagnosis not present

## 2017-02-05 LAB — URINALYSIS, ROUTINE W REFLEX MICROSCOPIC
BACTERIA UA: NONE SEEN
BILIRUBIN URINE: NEGATIVE
GLUCOSE, UA: 50 mg/dL — AB
HGB URINE DIPSTICK: NEGATIVE
Ketones, ur: 5 mg/dL — AB
LEUKOCYTES UA: NEGATIVE
NITRITE: NEGATIVE
Protein, ur: NEGATIVE mg/dL
SPECIFIC GRAVITY, URINE: 1.021 (ref 1.005–1.030)
pH: 5 (ref 5.0–8.0)

## 2017-02-05 LAB — COMPREHENSIVE METABOLIC PANEL
ALBUMIN: 4.3 g/dL (ref 3.5–5.0)
ALT: 18 U/L (ref 17–63)
ANION GAP: 9 (ref 5–15)
AST: 18 U/L (ref 15–41)
Alkaline Phosphatase: 126 U/L (ref 38–126)
BILIRUBIN TOTAL: 0.5 mg/dL (ref 0.3–1.2)
BUN: 16 mg/dL (ref 6–20)
CHLORIDE: 102 mmol/L (ref 101–111)
CO2: 25 mmol/L (ref 22–32)
Calcium: 9.3 mg/dL (ref 8.9–10.3)
Creatinine, Ser: 0.71 mg/dL (ref 0.61–1.24)
GFR calc Af Amer: >60 mL/min (ref >60)
GFR calc non Af Amer: >60 mL/min (ref >60)
GLUCOSE: 202 mg/dL — AB (ref 65–99)
POTASSIUM: 3.5 mmol/L (ref 3.5–5.1)
SODIUM: 136 mmol/L (ref 135–145)
TOTAL PROTEIN: 7.8 g/dL (ref 6.5–8.1)

## 2017-02-05 LAB — LIPASE, BLOOD: LIPASE: 20 U/L (ref 11–51)

## 2017-02-05 MED ORDER — PANTOPRAZOLE SODIUM 20 MG PO TBEC: 20.0000 mg | DELAYED_RELEASE_TABLET | Freq: Every day | ORAL | 3 refills | Status: DC

## 2017-02-05 MED ORDER — ONDANSETRON HCL 4 MG PO TABS: 4.0000 mg | ORAL_TABLET | Freq: Four times a day (QID) | ORAL | 0 refills | Status: DC

## 2017-02-05 MED ORDER — IOPAMIDOL (ISOVUE-300) INJECTION 61%
INTRAVENOUS | Status: AC
  Administered 2017-02-05: 100 mL via INTRAVENOUS
  Filled 2017-02-05: qty 100

## 2017-02-05 MED ORDER — IOPAMIDOL (ISOVUE-300) INJECTION 61%
100.0000 mL | Freq: Once | INTRAVENOUS | Status: AC | PRN
  Administered 2017-02-05: 100 mL via INTRAVENOUS

## 2017-02-05 NOTE — ED Notes (Signed)
[facility] called for transport.  

## 2017-02-05 NOTE — ED Notes (Signed)
Patient transported to CT 

## 2017-02-08 ENCOUNTER — Other Ambulatory Visit: Payer: Self-pay

## 2017-02-08 ENCOUNTER — Telehealth: Payer: Self-pay | Admitting: Gastroenterology

## 2017-02-08 DIAGNOSIS — K222 Esophageal obstruction: Secondary | ICD-10-CM

## 2017-02-08 NOTE — Telephone Encounter (Signed)
Spoke to patient, ED wanted him to follow up with GI. Patient is able to drink and eat now. Please advise on follow up.

## 2017-02-08 NOTE — Telephone Encounter (Signed)
Left message to call back  

## 2017-02-08 NOTE — Telephone Encounter (Signed)
I reviewed the CT scan and the ED doctor's note.  Although he has a large hiatal hernia of the esophagus and the lower esophagus wall looks thickened, the esophagus does not appear impacted.  In fact, he has a large recto-sigmoid colon fecal impaction.  It is not clear that they did anything about that in the ED.  If he has not already done so, he needs to use 2 fleet's enemas and take miralax, one capful twice daily to relieve the fecal impaction.  Given his condition, he will need help from home health if he has that available.  If that does not work and he has recurrence of same symptoms that brought him to ED last week, then he needs to go back to the ED.  He needs an upper endoscopy to evaluate the esophagus.  I do not know if he needs dilation or Botox injection, but I can make that decision at the time of procedure. Please schedule it wit Botox so we know the medicine will be available. The EGD must be done at the [facility] lab.  As it stands now, I have time on January 4th.  If Thomas Ramos is agreeable to that, please get him on the schedule that day.  Please do not make him come to the office for written instructions or to sign consent because he has mobility and transportation difficulty. Let me know if any more questions arise.

## 2017-02-08 NOTE — Telephone Encounter (Signed)
Patient given instructions, repeatedly about using fleet's enemas and miralax BID. He does not have anyone at home to help with fecal impaction, he does understand if he does not have any relief will need to go back to ED.  Spoke to [facility] endoscopy and on 03/19/17 unfortunately there is not any MAC anaesthesthia available, therefore unable to schedule that day. He is scheduled on 04/06/17.

## 2017-02-15 ENCOUNTER — Telehealth: Payer: Self-pay | Admitting: Gastroenterology

## 2017-02-15 NOTE — Telephone Encounter (Signed)
Patient did not have to use the Fleets enemas, has been having regular daily bowel movements. Patient was concerned about taking Miralax daily, as he did not want to have diarrhea. Instructed to use Miralax as needed. Confirmed his EGD date/time. He understands to look for the paperwork in the mail, mailed on 11/30.

## 2017-03-13 ENCOUNTER — Inpatient Hospital Stay (HOSPITAL_COMMUNITY)
Admission: EM | Admit: 2017-03-13 | Discharge: 2017-03-18 | DRG: 871 | Disposition: A | Discharge status: Home / Self Care | Payer: Medicare Other | Attending: Internal Medicine | Admitting: Internal Medicine

## 2017-03-13 ENCOUNTER — Emergency Department (HOSPITAL_COMMUNITY): Payer: Medicare Other

## 2017-03-13 ENCOUNTER — Encounter (HOSPITAL_COMMUNITY): Payer: Self-pay

## 2017-03-13 DIAGNOSIS — Z79899 Other long term (current) drug therapy: Secondary | ICD-10-CM

## 2017-03-13 DIAGNOSIS — I5032 Chronic diastolic (congestive) heart failure: Secondary | ICD-10-CM | POA: Diagnosis present

## 2017-03-13 DIAGNOSIS — E119 Type 2 diabetes mellitus without complications: Secondary | ICD-10-CM | POA: Diagnosis present

## 2017-03-13 DIAGNOSIS — I110 Hypertensive heart disease with heart failure: Secondary | ICD-10-CM | POA: Diagnosis present

## 2017-03-13 DIAGNOSIS — J9601 Acute respiratory failure with hypoxia: Secondary | ICD-10-CM | POA: Diagnosis present

## 2017-03-13 DIAGNOSIS — R000 Tachycardia, unspecified: Secondary | ICD-10-CM

## 2017-03-13 DIAGNOSIS — I10 Essential (primary) hypertension: Secondary | ICD-10-CM | POA: Diagnosis not present

## 2017-03-13 DIAGNOSIS — Z87891 Personal history of nicotine dependence: Secondary | ICD-10-CM

## 2017-03-13 DIAGNOSIS — I509 Heart failure, unspecified: Secondary | ICD-10-CM

## 2017-03-13 DIAGNOSIS — J45901 Unspecified asthma with (acute) exacerbation: Secondary | ICD-10-CM | POA: Diagnosis present

## 2017-03-13 DIAGNOSIS — E872 Acidosis: Secondary | ICD-10-CM | POA: Diagnosis present

## 2017-03-13 DIAGNOSIS — F419 Anxiety disorder, unspecified: Secondary | ICD-10-CM | POA: Diagnosis present

## 2017-03-13 DIAGNOSIS — H409 Unspecified glaucoma: Secondary | ICD-10-CM | POA: Diagnosis present

## 2017-03-13 DIAGNOSIS — J209 Acute bronchitis, unspecified: Secondary | ICD-10-CM | POA: Diagnosis present

## 2017-03-13 DIAGNOSIS — A419 Sepsis, unspecified organism: Principal | ICD-10-CM | POA: Diagnosis present

## 2017-03-13 DIAGNOSIS — Z7984 Long term (current) use of oral hypoglycemic drugs: Secondary | ICD-10-CM

## 2017-03-13 DIAGNOSIS — G8250 Quadriplegia, unspecified: Secondary | ICD-10-CM | POA: Diagnosis present

## 2017-03-13 DIAGNOSIS — G809 Cerebral palsy, unspecified: Secondary | ICD-10-CM | POA: Diagnosis present

## 2017-03-13 DIAGNOSIS — E785 Hyperlipidemia, unspecified: Secondary | ICD-10-CM | POA: Diagnosis present

## 2017-03-13 DIAGNOSIS — K219 Gastro-esophageal reflux disease without esophagitis: Secondary | ICD-10-CM | POA: Diagnosis present

## 2017-03-13 DIAGNOSIS — R509 Fever, unspecified: Secondary | ICD-10-CM

## 2017-03-13 DIAGNOSIS — R0602 Shortness of breath: Secondary | ICD-10-CM

## 2017-03-13 DIAGNOSIS — I5033 Acute on chronic diastolic (congestive) heart failure: Secondary | ICD-10-CM | POA: Diagnosis present

## 2017-03-13 DIAGNOSIS — E876 Hypokalemia: Secondary | ICD-10-CM | POA: Diagnosis present

## 2017-03-13 DIAGNOSIS — E860 Dehydration: Secondary | ICD-10-CM | POA: Diagnosis present

## 2017-03-13 LAB — COMPREHENSIVE METABOLIC PANEL
ALK PHOS: 127 U/L — AB (ref 38–126)
ALT: 18 U/L (ref 17–63)
AST: 20 U/L (ref 15–41)
Albumin: 4 g/dL (ref 3.5–5.0)
Anion gap: 13 (ref 5–15)
BILIRUBIN TOTAL: 0.4 mg/dL (ref 0.3–1.2)
BUN: 20 mg/dL (ref 6–20)
CALCIUM: 9 mg/dL (ref 8.9–10.3)
CO2: 22 mmol/L (ref 22–32)
CREATININE: 0.82 mg/dL (ref 0.61–1.24)
Chloride: 104 mmol/L (ref 101–111)
GFR calc Af Amer: >60 mL/min (ref >60)
Glucose, Bld: 162 mg/dL — ABNORMAL HIGH (ref 65–99)
POTASSIUM: 3.9 mmol/L (ref 3.5–5.1)
Sodium: 139 mmol/L (ref 135–145)
TOTAL PROTEIN: 7.2 g/dL (ref 6.5–8.1)

## 2017-03-13 LAB — I-STAT CG4 LACTIC ACID, ED
LACTIC ACID, VENOUS: 2.34 mmol/L — AB (ref 0.5–1.9)
Lactic Acid, Venous: 2.67 mmol/L (ref 0.5–1.9)

## 2017-03-13 LAB — URINALYSIS, ROUTINE W REFLEX MICROSCOPIC
Bacteria, UA: NONE SEEN
Bilirubin Urine: NEGATIVE
HGB URINE DIPSTICK: NEGATIVE
Ketones, ur: NEGATIVE mg/dL
LEUKOCYTES UA: NEGATIVE
NITRITE: NEGATIVE
PROTEIN: NEGATIVE mg/dL
SPECIFIC GRAVITY, URINE: 1.021 (ref 1.005–1.030)
Squamous Epithelial / LPF: NONE SEEN
pH: 5 (ref 5.0–8.0)

## 2017-03-13 LAB — CBC WITH DIFFERENTIAL/PLATELET
BASOS PCT: 0 %
Basophils Absolute: 0 10*3/uL (ref 0.0–0.1)
EOS ABS: 0 10*3/uL (ref 0.0–0.7)
EOS PCT: 0 %
HCT: 37.7 % — ABNORMAL LOW (ref 39.0–52.0)
Hemoglobin: 12.4 g/dL — ABNORMAL LOW (ref 13.0–17.0)
LYMPHS ABS: 1.7 10*3/uL (ref 0.7–4.0)
Lymphocytes Relative: 10 %
MCH: 27 pg (ref 26.0–34.0)
MCHC: 32.9 g/dL (ref 30.0–36.0)
MCV: 82 fL (ref 78.0–100.0)
MONO ABS: 0.8 10*3/uL (ref 0.1–1.0)
MONOS PCT: 5 %
Neutro Abs: 14 10*3/uL — ABNORMAL HIGH (ref 1.7–7.7)
Neutrophils Relative %: 85 %
Platelets: 256 10*3/uL (ref 150–400)
RBC: 4.6 MIL/uL (ref 4.22–5.81)
RDW: 13.4 % (ref 11.5–15.5)
WBC: 16.6 10*3/uL — ABNORMAL HIGH (ref 4.0–10.5)

## 2017-03-13 LAB — PROTIME-INR
INR: 1.06
PROTHROMBIN TIME: 13.7 s (ref 11.4–15.2)

## 2017-03-13 LAB — INFLUENZA PANEL BY PCR (TYPE A & B)
INFLBPCR: NEGATIVE
Influenza A By PCR: NEGATIVE

## 2017-03-13 MED ORDER — ALBUTEROL SULFATE (2.5 MG/3ML) 0.083% IN NEBU
5.0000 mg | INHALATION_SOLUTION | Freq: Once | RESPIRATORY_TRACT | Status: AC
Start: 2017-03-13 — End: 2017-03-13
  Administered 2017-03-13: 5 mg via RESPIRATORY_TRACT
  Filled 2017-03-13: qty 6

## 2017-03-13 MED ORDER — LEVOFLOXACIN IN D5W 750 MG/150ML IV SOLN
750.0000 mg | Freq: Once | INTRAVENOUS | Status: AC
  Administered 2017-03-13: 750 mg via INTRAVENOUS
  Filled 2017-03-13: qty 150

## 2017-03-13 MED ORDER — IPRATROPIUM BROMIDE 0.02 % IN SOLN
0.5000 mg | Freq: Once | RESPIRATORY_TRACT | Status: AC
  Administered 2017-03-13: 0.5 mg via RESPIRATORY_TRACT
  Filled 2017-03-13: qty 2.5

## 2017-03-13 MED ORDER — VANCOMYCIN HCL IN DEXTROSE 1-5 GM/200ML-% IV SOLN
1000.0000 mg | Freq: Once | INTRAVENOUS | Status: DC
  Filled 2017-03-13: qty 200

## 2017-03-13 MED ORDER — ALBUTEROL SULFATE (2.5 MG/3ML) 0.083% IN NEBU
5.0000 mg | INHALATION_SOLUTION | Freq: Once | RESPIRATORY_TRACT | Status: AC
  Administered 2017-03-13: 5 mg via RESPIRATORY_TRACT
  Filled 2017-03-13: qty 6

## 2017-03-13 MED ORDER — SODIUM CHLORIDE 0.9 % IV BOLUS (SEPSIS)
1000.0000 mL | Freq: Once | INTRAVENOUS | Status: AC
  Administered 2017-03-13: 1000 mL via INTRAVENOUS

## 2017-03-13 MED ORDER — VANCOMYCIN HCL 10 G IV SOLR
1500.0000 mg | Freq: Once | INTRAVENOUS | Status: AC
  Administered 2017-03-13: 1500 mg via INTRAVENOUS
  Filled 2017-03-13: qty 1500

## 2017-03-13 MED ORDER — DEXTROSE 5 % IV SOLN
2.0000 g | Freq: Once | INTRAVENOUS | Status: AC
  Administered 2017-03-13: 2 g via INTRAVENOUS
  Filled 2017-03-13: qty 2

## 2017-03-13 MED ORDER — MAGNESIUM SULFATE 2 GM/50ML IV SOLN
2.0000 g | Freq: Once | INTRAVENOUS | Status: AC
  Administered 2017-03-14: 2 g via INTRAVENOUS
  Filled 2017-03-13: qty 50

## 2017-03-13 MED ORDER — ACETAMINOPHEN 500 MG PO TABS
1000.0000 mg | ORAL_TABLET | Freq: Once | ORAL | Status: AC
  Administered 2017-03-13: 1000 mg via ORAL
  Filled 2017-03-13: qty 2

## 2017-03-13 MED ORDER — IBUPROFEN 400 MG PO TABS
600.0000 mg | ORAL_TABLET | Freq: Once | ORAL | Status: AC
  Administered 2017-03-13: 600 mg via ORAL
  Filled 2017-03-13: qty 1

## 2017-03-13 MED ORDER — METHYLPREDNISOLONE SODIUM SUCC 125 MG IJ SOLR
125.0000 mg | Freq: Once | INTRAMUSCULAR | Status: AC
  Administered 2017-03-13: 125 mg via INTRAVENOUS
  Filled 2017-03-13: qty 2

## 2017-03-13 NOTE — ED Notes (Signed)
Family at bedside. 

## 2017-03-13 NOTE — ED Notes (Signed)
Dr.Mabe shown results of Lactic Acid. ED-LAb

## 2017-03-13 NOTE — H&P (Signed)
History and Physical    BOE DEANS IWL:[id number] DOB: Sep 09, 1970 DOA: 03/13/2017  Referring MD/NP/PA:   PCP: Chesley Noon, MD   Patient coming from:  The patient is coming from home.  At baseline, pt is independent for most of ADL.   Chief Complaint: Productive cough, shortness of breath  HPI: Thomas Ramos is a 46 y.o. male with medical history significant of cerebral palsy, quadriplegia, CHF, hypertension, hyperlipidemia, diabetes mellitus, asthma, GERD, anxiety, who presents with cough and shortness of breath.  Patient states that he has been having cough and shortness of breath since yesterday. He coughs up brownish colored sputum. He denies chest pain. He also has fever and chills. He has runny nose and sore throat. Patient has nausea and vomited twice, and had one loose stool bowel movement. Denies abdominal pain. No symptoms of UTI. Per report, patient had hypotension with systolic pressure 17E, which improved to 118/64 after 500 mL normal saline bolus.   ED Course: pt was found to have WBC 16.6, lactic acid 2.67, electrolytes renal function okay, negative flu PCR, negative urinalysis, temperature 102.1, tachycardia, tachypnea, current blood pressure 96/51, chest x-ray showed hypoaeration without infiltration. Patient is admitted to stepdown as inpatient.  Review of Systems:   General: has fevers, chills, has poor appetite, has fatigue HEENT: no blurry vision, hearing changes or sore throat Respiratory: has dyspnea, coughing, wheezing CV: no chest pain, no palpitations GI: has nausea, vomiting, no abdominal pain, diarrhea, constipation GU: no dysuria, burning on urination, increased urinary frequency, hematuria  Ext: trace leg edema Neuro: has quadriplegia Skin: no rash, no skin tear. MSK: No muscle spasm, no deformity, no limitation of range of movement in spin Heme: No easy bruising.  Travel history: No recent long distant travel.  Allergy:  Allergies  Allergen  Reactions  . Sulfamethoxazole-Trimethoprim Nausea And Vomiting  . Metronidazole Nausea And Vomiting  . Penicillins Hives, Nausea And Vomiting and Other (See Comments)    Patient tolerated cefazolin in 2017 Has patient had a PCN reaction causing immediate rash, facial/tongue/throat swelling, SOB or lightheadedness with hypotension: Yes Has patient had a PCN reaction causing severe rash involving mucus membranes or skin necrosis: No Has patient had a PCN reaction that required hospitalization No Has patient had a PCN reaction occurring within the last 10 years: Yes If all of the above answers are "NO", then may proceed with Cephalosporin use.    Past Medical History:  Diagnosis Date  . Anxiety   . Cerebral palsy ([place])   . Chronic diastolic (congestive) heart failure ([place])   . CP (cerebral palsy) ([place])   . Diabetes mellitus without complication (HCC)    borderline  . Environmental allergies    takes inhalers if needed  . Esophageal stricture   . GERD (gastroesophageal reflux disease)   . Hypertension   . Motility disorder, esophageal   . Pneumonia   . 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[place])   . S/P Botox injection    approx every 4 months  . Seasonal allergies     Past Surgical History:  Procedure Laterality Date  . BOTOX INJECTION N/A 12/21/2012   Procedure: BOTOX INJECTION;  Surgeon: Inda Castle, MD;  Location: [facility];  Service: Endoscopy;  Laterality: N/A;  . BOTOX INJECTION N/A 06/28/2014   Procedure: BOTOX INJECTION;  Surgeon: Inda Castle, MD;  Location: [facility];  Service: Endoscopy;  Laterality: N/A;  . ESOPHAGOGASTRODUODENOSCOPY N/A 12/21/2012   Procedure: ESOPHAGOGASTRODUODENOSCOPY (EGD);  Surgeon: Inda Castle, MD;  [facility];  Service: Endoscopy;  Laterality: N/A;  . ESOPHAGOGASTRODUODENOSCOPY N/A 06/28/2014   Procedure: ESOPHAGOGASTRODUODENOSCOPY (EGD);  Surgeon: Inda Castle, MD;  Location: [facility];  Service: Endoscopy;   Laterality: N/A;  . ESOPHAGUS SURGERY     stretched esophagus  . legs    . MOUTH SURGERY    . TENDON RELEASE      Social History:  reports that he has quit smoking. He has quit using smokeless tobacco. He reports that he drinks alcohol. He reports that he does not use drugs.  Family History:  Family History  Problem Relation Age of Onset  . Hypertension Father   . Lung cancer Father   . Diabetes Mother      Prior to Admission medications   Medication Sig Start Date End Date Taking? Authorizing Provider  albuterol (PROVENTIL HFA;VENTOLIN HFA) 108 (90 Base) MCG/ACT inhaler Inhale 2 puffs into the lungs every 6 (six) hours as needed for wheezing or shortness of breath. Use inhaler 3 times daily x 5 days, then every 6 hours as needed. 08/18/16   Eugenie Filler, MD  amLODipine (NORVASC) 10 MG tablet Take 10 mg by mouth daily.    [provider]  cetirizine (ZYRTEC) 10 MG tablet Take 10 mg by mouth daily.     [provider]  fluticasone (FLONASE) 50 MCG/ACT nasal spray Place 1 spray into both nostrils daily as needed for rhinitis. 08/18/16   Eugenie Filler, MD  furosemide (LASIX) 20 MG tablet Take 20 mg by mouth every morning. 05/06/16   [provider]  metFORMIN (GLUCOPHAGE-XR) 500 MG 24 hr tablet Take 500 mg by mouth at bedtime. 08/06/16 08/06/17  [provider]  montelukast (SINGULAIR) 10 MG tablet Take 10 mg by mouth every morning. 08/12/16   [provider]  nitroGLYCERIN (NITROSTAT) 0.4 MG SL tablet Place 0.4 mg under the tongue every 5 (five) minutes as needed for chest pain.     [provider]  ondansetron (ZOFRAN) 4 MG tablet Take 1 tablet (4 mg total) by mouth every 6 (six) hours. 02/05/17   Orpah Greek, MD  pantoprazole (PROTONIX) 20 MG tablet Take 1 tablet (20 mg total) by mouth daily. 02/05/17   Orpah Greek, MD  rosuvastatin (CRESTOR) 5 MG tablet Take 5 mg by mouth at bedtime.    [provider]  timolol (TIMOPTIC) 0.5 % ophthalmic solution Place 1 drop into both eyes every 12 (twelve) hours. 07/31/16   [provider]  valsartan (DIOVAN) 80 MG tablet Take 80 mg by mouth daily.    [provider]    Physical Exam: Vitals:   03/13/17 2345 03/14/17 0030 03/14/17 0200 03/14/17 0230  BP: (!) 98/32 (!) 119/97 111/75 109/77  Pulse: (!) 107 (!) 113 95 96  Resp: (!) 34 (!) 31 (!) 22 (!) 29  Temp:      TempSrc:      SpO2: 96% 96% 97% 97%   General: Not in acute distress HEENT:       Eyes: PERRL, EOMI, no scleral icterus.       ENT: No discharge from the ears and nose, no pharynx injection, no tonsillar enlargement.        Neck: No JVD, no bruit, no mass felt. Heme: No neck lymph node enlargement. Cardiac: S1/S2, RRR, No murmurs, No gallops or rubs. Respiratory: Has wheezing and coarse breathing sound bilaterally.  GI: Soft, nondistended, nontender, no rebound pain, no organomegaly, BS present. GU:  No hematuria Ext: has trace leg edema bilaterally. 2+DP/PT pulse bilaterally. Musculoskeletal: No joint deformities, No joint redness or warmth, no limitation of ROM in spin. Skin: No rashes.  Neuro: Alert, oriented X3, cranial nerves II-XII grossly intact, has quadriplegia Psych: Patient is not psychotic, no suicidal or hemocidal ideation.  Labs on Admission: I have personally reviewed following labs and imaging studies  CBC: Recent Labs  Lab 03/13/17 1834  WBC 16.6*  NEUTROABS 14.0*  HGB 12.4*  HCT 37.7*  MCV 82.0  PLT 229   Basic Metabolic Panel: Recent Labs  Lab 03/13/17 1834  NA 139  K 3.9  CL 104  CO2 22  GLUCOSE 162*  BUN 20  CREATININE 0.82  CALCIUM 9.0   GFR: CrCl cannot be calculated (Unknown ideal weight.). Liver Function Tests: Recent Labs  Lab 03/13/17 1834  AST 20  ALT 18  ALKPHOS 127*  BILITOT 0.4  PROT 7.2  ALBUMIN 4.0   No results for input(s): LIPASE, AMYLASE in the last 168 hours. No results for input(s): AMMONIA  in the last 168 hours. Coagulation Profile: Recent Labs  Lab 03/13/17 1834  INR 1.06   Cardiac Enzymes: No results for input(s): CKTOTAL, CKMB, CKMBINDEX, TROPONINI in the last 168 hours. BNP (last 3 results) No results for input(s): PROBNP in the last 8760 hours. HbA1C: No results for input(s): HGBA1C in the last 72 hours. CBG: Recent Labs  Lab 03/14/17 0230  GLUCAP 180*   Lipid Profile: No results for input(s): CHOL, HDL, LDLCALC, TRIG, CHOLHDL, LDLDIRECT in the last 72 hours. Thyroid Function Tests: No results for input(s): TSH, T4TOTAL, FREET4, T3FREE, THYROIDAB in the last 72 hours. Anemia Panel: No results for input(s): VITAMINB12, FOLATE, FERRITIN, TIBC, IRON, RETICCTPCT in the last 72 hours. Urine analysis:    Component Value Date/Time   COLORURINE YELLOW 03/13/2017 2107   APPEARANCEUR CLEAR 03/13/2017 2107   LABSPEC 1.021 03/13/2017 2107   PHURINE 5.0 03/13/2017 2107   GLUCOSEU >=500 (A) 03/13/2017 2107   HGBUR NEGATIVE 03/13/2017 2107   BILIRUBINUR NEGATIVE 03/13/2017 2107   [place] NEGATIVE 03/13/2017 2107   PROTEINUR NEGATIVE 03/13/2017 2107   UROBILINOGEN 1.0 05/26/2014 2211   NITRITE NEGATIVE 03/13/2017 2107   LEUKOCYTESUR NEGATIVE 03/13/2017 2107   Sepsis Labs: @LABRCNTIP (procalcitonin:4,lacticidven:4) )No results found for this or any previous visit (from the past 240 hour(s)).   Radiological Exams on Admission: Dg Chest Portable 1 View  Result Date: 03/13/2017 CLINICAL DATA:  Shortness of Breath EXAM: PORTABLE CHEST 1 VIEW COMPARISON:  11/09/2016 FINDINGS: Cardiac shadow is stable. The lungs are hypoaerated bilaterally. Elevation of the right hemidiaphragm is again seen. No focal infiltrate is noted. No acute bony abnormality is seen. IMPRESSION: Hypoaeration.  No acute abnormality noted. Electronically Signed   By: Inez Catalina M.D.   On: 03/13/2017 19:49     EKG: Independently reviewed. Sinus rhythm, tachycardia, QTC 437, RAD, nonspecific  T-wave change.  Assessment/Plan Principal Problem:   Acute respiratory failure with hypoxia (HCC) Active Problems:   Asthma exacerbation   GERD (gastroesophageal reflux disease)   Chronic diastolic (congestive) heart failure (HCC)   Essential hypertension   Sepsis ([place])   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HCC)   Acute respiratory failure with hypoxia due to  Asthma exacerbation and sepsis: Chest x-ray negative for infiltration. Patient has sepsis with leukocytosis, tachycardia, tachypnea, fever. Blood pressure is soft. Lactic acid is 2.67, currently hemodynamically stable.  -will admit to SDU as inpt - Scheduled Atrovent and prn Xopenex nebs -Solu-Medrol 60 mg IV  q8h  - Levaquin (patient also received one dose of vancomycin and aztreonam in ED) -Mucinex for cough  -Urine S. pneumococcal antigen -Follow up blood culture x2, sputum culture, respiratory virus panel, Flu pcr -will get Procalcitonin and trend lactic acid levels per sepsis protocol. -IVF: 2L of NS bolus in ED, followed by 100 cc/h (patient has a congestive heart failure, limiting aggressive IV fluids treatment).  GERD: -Protonix  HTN: -hold Bp med due to soft blood pressure -IV hydralazine when necessary  Chronic diastolic (congestive) heart failure ([place]): 2-D echo on 07/23/15 showed EF of 55-60% with grade 2 diastolic dysfunction. Patient has trace leg edema, but no JVD. CHF seems to be compensated. -Hold her Lasix -Check BNP  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[place]):  -no acute issues   DVT ppx:  SQ Lovenox Code Status: Full code Family Communication:   Yes, patient's care giver at bed side Disposition Plan:  Anticipate discharge back to previous home environment Consults called:  none Admission status: SDU/inpation       Date of Service 03/14/2017    Ivor Costa [facility] Pager [phone]  If 7PM-7AM, please contact night-coverage [email or web address] Password [email or web address] 03/14/2017, 4:56 AM

## 2017-03-13 NOTE — ED Provider Notes (Signed)
[facility] EMERGENCY DEPARTMENT Provider Note   CSN: [id number] Arrival date & time: 03/13/17  1828     History   Chief Complaint Chief Complaint  Patient presents with  . Cough    HPI Thomas Ramos is a 46 y.o. male.  HPI  Patient with history of cerebral palsy, CHF, diabetes, asthma, quadriplegia presents with complaint of cough congestion shortness of breath and cold sweats.  His symptoms began several days ago.  He is not sure if he has fever.  He has not had any treatment prior to arrival.  His caregiver that is here in the ER with him states he has not been drinking well for the past several days and has not had a good appetite for solids.  He denies chest pain.  No vomiting or diarrhea. There are no other associated systemic symptoms, there are no other alleviating or modifying factors.  Pt brought in by EMS - was hypotensive but BP responded to IV fluids.    Past Medical History:  Diagnosis Date  . Anxiety   . Cerebral palsy ([place])   . Chronic diastolic (congestive) heart failure ([place])   . CP (cerebral palsy) ([place])   . Diabetes mellitus without complication (HCC)    borderline  . Environmental allergies    takes inhalers if needed  . Esophageal stricture   . GERD (gastroesophageal reflux disease)   . Hypertension   . Motility disorder, esophageal   . Pneumonia   . 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[place])   . S/P Botox injection    approx every 4 months  . Seasonal allergies     Patient Active Problem List   Diagnosis Date Noted  . Acute respiratory failure with hypoxia ([place]) 03/13/2017  . 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[place])   . Rhinovirus infection 08/17/2016  . Pressure injury of skin 08/16/2016  . Acute respiratory distress 08/15/2016  . Tachycardia 08/15/2016  . Sepsis ([place]) 08/15/2016  . Onychomycosis 02/20/2016  . Essential hypertension   . OSA (obstructive sleep apnea) 12/11/2015  . Chronic diastolic (congestive) heart failure ([place])     . Chronic venous insufficiency   . Depression with anxiety 09/20/2015  . CAP (community acquired pneumonia) 09/20/2015  . Hypersomnia 07/16/2015  . Asthma exacerbation 07/16/2015  . GERD (gastroesophageal reflux disease) 07/16/2015  . Hematemesis with nausea   . Dysphagia 09/16/2010  . 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 BOTOX INJECTION N/A 12/21/2012   Procedure: BOTOX INJECTION;  Surgeon: Inda Castle, MD;  [facility];  Service: Endoscopy;  Laterality: N/A;  . BOTOX INJECTION N/A 06/28/2014   Procedure: BOTOX INJECTION;  Surgeon: Inda Castle, MD;  Location: [facility];  Service: Endoscopy;  Laterality: N/A;  . ESOPHAGOGASTRODUODENOSCOPY N/A 12/21/2012   Procedure: ESOPHAGOGASTRODUODENOSCOPY (EGD);  Surgeon: Inda Castle, MD;  Location: [facility];  Service: Endoscopy;  Laterality: N/A;  . ESOPHAGOGASTRODUODENOSCOPY N/A 06/28/2014   Procedure: ESOPHAGOGASTRODUODENOSCOPY (EGD);  Surgeon: Inda Castle, MD;  Location: [facility];  Service: Endoscopy;  Laterality: N/A;  . ESOPHAGUS SURGERY     stretched esophagus  . legs    . MOUTH SURGERY    . TENDON RELEASE         Home Medications    Prior to Admission medications   Medication Sig Start Date End Date Taking? Authorizing Provider  albuterol (PROVENTIL HFA;VENTOLIN HFA) 108 (90 Base) MCG/ACT inhaler Inhale 2 puffs into the lungs every 6 (  six) hours as needed for wheezing or shortness of breath. Use inhaler 3 times daily x 5 days, then every 6 hours as needed. 08/18/16   Eugenie Filler, MD  amLODipine (NORVASC) 10 MG tablet Take 10 mg by mouth daily.    [provider]  cetirizine (ZYRTEC) 10 MG tablet Take 10 mg by mouth daily.     [provider]  fluticasone (FLONASE) 50 MCG/ACT nasal spray Place 1 spray into both nostrils daily as needed for rhinitis. 08/18/16   Eugenie Filler, MD  furosemide (LASIX) 20 MG tablet  Take 20 mg by mouth every morning. 05/06/16   [provider]  metFORMIN (GLUCOPHAGE-XR) 500 MG 24 hr tablet Take 500 mg by mouth at bedtime. 08/06/16 08/06/17  [provider]  montelukast (SINGULAIR) 10 MG tablet Take 10 mg by mouth every morning. 08/12/16   [provider]  nitroGLYCERIN (NITROSTAT) 0.4 MG SL tablet Place 0.4 mg under the tongue every 5 (five) minutes as needed for chest pain.     [provider]  ondansetron (ZOFRAN) 4 MG tablet Take 1 tablet (4 mg total) by mouth every 6 (six) hours. 02/05/17   Orpah Greek, MD  pantoprazole (PROTONIX) 20 MG tablet Take 1 tablet (20 mg total) by mouth daily. 02/05/17   Orpah Greek, MD  rosuvastatin (CRESTOR) 5 MG tablet Take 5 mg by mouth at bedtime.    [provider]  timolol (TIMOPTIC) 0.5 % ophthalmic solution Place 1 drop into both eyes every 12 (twelve) hours. 07/31/16   [provider]  valsartan (DIOVAN) 80 MG tablet Take 80 mg by mouth daily.    [provider]    Family History Family History  Problem Relation Age of Onset  . Hypertension Father   . Lung cancer Father   . Diabetes Mother     Social History Social History   Tobacco Use  . Smoking status: Former Research scientist (life sciences)  . Smokeless tobacco: Former Systems developer  . Tobacco comment: smoked one time  Substance Use Topics  . Alcohol use: Yes    Alcohol/week: 0.0 oz    Comment: 2 beers every other Friday and Saturday  . Drug use: No     Allergies   Sulfamethoxazole-trimethoprim; Metronidazole; and Penicillins   Review of Systems Review of Systems  ROS reviewed and all otherwise negative except for mentioned in HPI   Physical Exam Updated Vital Signs BP 116/61   Pulse (!) 113   Temp (S) (!) 102.1 F (38.9 C) (Rectal)   Resp (!) 34   SpO2 94%  Vitals reviewed Physical Exam  Physical Examination: General appearance - alert, ill appearing, and in mild distress Mental status - ill appearing,  oriented to person, place, and time Eyes - no conjunctival injection, no scleral icterus Mouth - mucous membranes dry Neck - supple, no significant adenopathy Chest - bilateral coarse rhonchi, expiratory wheezing throughout, mild increased respiratory effort, frequent coughing Heart -increased rate, regular rhythm, normal S1, S2, no murmurs, rubs, clicks or gallops Abdomen - soft, nontender, nondistended, no masses or organomegaly Neurological - alert, oriented x 3, contractures in extremities x 4 with muscle wasting Extremities - peripheral pulses normal, no pedal edema, no clubbing or cyanosis Skin - normal coloration and turgor, no rashes   ED Treatments / Results  Labs (all labs ordered are listed, but only abnormal results are displayed) Labs Reviewed  COMPREHENSIVE METABOLIC PANEL - Abnormal; Notable for the following components:  Result Value   Glucose, Bld 162 (*)    Alkaline Phosphatase 127 (*)    All other components within normal limits  CBC WITH DIFFERENTIAL/PLATELET - Abnormal; Notable for the following components:   WBC 16.6 (*)    Hemoglobin 12.4 (*)    HCT 37.7 (*)    Neutro Abs 14.0 (*)    All other components within normal limits  URINALYSIS, ROUTINE W REFLEX MICROSCOPIC - Abnormal; Notable for the following components:   Glucose, UA >=500 (*)    All other components within normal limits  I-STAT CG4 LACTIC ACID, ED - Abnormal; Notable for the following components:   Lactic Acid, Venous 2.34 (*)    All other components within normal limits  I-STAT CG4 LACTIC ACID, ED - Abnormal; Notable for the following components:   Lactic Acid, Venous 2.67 (*)    All other components within normal limits  CULTURE, BLOOD (ROUTINE X 2)  CULTURE, BLOOD (ROUTINE X 2)  URINE CULTURE  PROTIME-INR  INFLUENZA PANEL BY PCR (TYPE A & B)    EKG  EKG Interpretation  Date/Time:  Saturday March 13 2017 18:35:59 EST Ventricular Rate:  138 PR Interval:    QRS  Duration: 96 QT Interval:  286 QTC Calculation: 437 R Axis:   123 Text Interpretation:  Sinus tachycardia Right axis deviation Low voltage, precordial leads Borderline repolarization abnormality Minimal ST elevation, lateral leads Baseline wander in lead(s) I II aVR No significant change since last tracing except more artifact in baseline Confirmed by Alfonzo Beers [phone]) on 03/13/2017 7:36:34 PM       Radiology Dg Chest Portable 1 View  Result Date: 03/13/2017 CLINICAL DATA:  Shortness of Breath EXAM: PORTABLE CHEST 1 VIEW COMPARISON:  11/09/2016 FINDINGS: Cardiac shadow is stable. The lungs are hypoaerated bilaterally. Elevation of the right hemidiaphragm is again seen. No focal infiltrate is noted. No acute bony abnormality is seen. IMPRESSION: Hypoaeration.  No acute abnormality noted. Electronically Signed   By: Inez Catalina M.D.   On: 03/13/2017 19:49    Procedures Procedures (including critical care time)  Medications Ordered in ED Medications  magnesium sulfate IVPB 2 g 50 mL (not administered)  albuterol (PROVENTIL) (2.5 MG/3ML) 0.083% nebulizer solution 5 mg (5 mg Nebulization Given 03/13/17 2040)  ipratropium (ATROVENT) nebulizer solution 0.5 mg (0.5 mg Nebulization Given 03/13/17 2040)  sodium chloride 0.9 % bolus 1,000 mL (0 mLs Intravenous Stopped 03/13/17 2141)  levofloxacin (LEVAQUIN) IVPB 750 mg (0 mg Intravenous Stopped 03/13/17 2213)  aztreonam (AZACTAM) 2 g in dextrose 5 % 50 mL IVPB (0 g Intravenous Stopped 03/13/17 2113)  vancomycin (VANCOCIN) 1,500 mg in sodium chloride 0.9 % 500 mL IVPB (0 mg Intravenous Stopped 03/13/17 2339)  acetaminophen (TYLENOL) tablet 1,000 mg (1,000 mg Oral Given 03/13/17 2145)  albuterol (PROVENTIL) (2.5 MG/3ML) 0.083% nebulizer solution 5 mg (5 mg Nebulization Given 03/13/17 2237)  ipratropium (ATROVENT) nebulizer solution 0.5 mg (0.5 mg Nebulization Given 03/13/17 2237)  sodium chloride 0.9 % bolus 1,000 mL (0 mLs Intravenous  Stopped 03/13/17 2339)  methylPREDNISolone sodium succinate (SOLU-MEDROL) 125 mg/2 mL injection 125 mg (125 mg Intravenous Given 03/13/17 2349)  ibuprofen (ADVIL,MOTRIN) tablet 600 mg (600 mg Oral Given 03/13/17 2349)  albuterol (PROVENTIL) (2.5 MG/3ML) 0.083% nebulizer solution 5 mg (5 mg Nebulization Given 03/13/17 2349)  ipratropium (ATROVENT) nebulizer solution 0.5 mg (0.5 mg Nebulization Given 03/13/17 2349)     Initial Impression / Assessment and Plan / ED Course  I have reviewed the triage vital signs  and the nursing notes.  Pertinent labs & imaging results that were available during my care of the patient were reviewed by me and considered in my medical decision making (see chart for details).    CRITICAL CARE Performed by: Townsend Roger L Total critical care time: 50 minutes Critical care time was exclusive of separately billable procedures and treating other patients. Critical care was necessary to treat or prevent imminent or life-threatening deterioration. Critical care was time spent personally by me on the following activities: development of treatment plan with patient and/or surrogate as well as nursing, discussions with consultants, evaluation of patient's response to treatment, examination of patient, obtaining history from patient or surrogate, ordering and performing treatments and interventions, ordering and review of laboratory studies, ordering and review of radiographic studies, pulse oximetry and re-evaluation of patient's condition.  Patient presenting with complaint of cough congestion and shortness of breath.  On arrival he is tachypneic tachycardic wheezing and coarse rhonchi in all lung fields.  His lactate is elevated.  He was started on the sepsis protocol.  Treated with duo nebs, broad-spectrum antibiotics, IV fluids.  He was also given Solu-Medrol and magnesium sulfate to help with his respiratory status.  He was given Tylenol and ibuprofen for his fever.  On  multiple rechecks his condition continues to improve however he remains somewhat tachypneic and tachycardic.  Patient will be admitted to stepdown by the hospitalist service.  D/w Dr. Blaine Hamper for admission.    Final Clinical Impressions(s) / ED Diagnoses   Final diagnoses:  Exacerbation of asthma, unspecified asthma severity, unspecified whether persistent  Febrile illness  Tachycardia    ED Discharge Orders    None       Merlean Pizzini, Forbes Cellar, MD 03/13/17 2358

## 2017-03-13 NOTE — ED Triage Notes (Signed)
To room via EMS.  Onset 2 days persistent cough, cough causes vomiting of phlegm, noted to be blood tinged.  BP 56'C systolic, after 127 IVF BP 118/64.  CBG 170. HR 140, 98% room air.

## 2017-03-14 DIAGNOSIS — J45901 Unspecified asthma with (acute) exacerbation: Secondary | ICD-10-CM | POA: Diagnosis present

## 2017-03-14 DIAGNOSIS — A419 Sepsis, unspecified organism: Secondary | ICD-10-CM | POA: Diagnosis present

## 2017-03-14 DIAGNOSIS — Z79899 Other long term (current) drug therapy: Secondary | ICD-10-CM | POA: Diagnosis not present

## 2017-03-14 DIAGNOSIS — I110 Hypertensive heart disease with heart failure: Secondary | ICD-10-CM | POA: Diagnosis present

## 2017-03-14 DIAGNOSIS — I5033 Acute on chronic diastolic (congestive) heart failure: Secondary | ICD-10-CM | POA: Diagnosis present

## 2017-03-14 DIAGNOSIS — E119 Type 2 diabetes mellitus without complications: Secondary | ICD-10-CM | POA: Diagnosis present

## 2017-03-14 DIAGNOSIS — J209 Acute bronchitis, unspecified: Secondary | ICD-10-CM | POA: Diagnosis present

## 2017-03-14 DIAGNOSIS — Z87891 Personal history of nicotine dependence: Secondary | ICD-10-CM | POA: Diagnosis not present

## 2017-03-14 DIAGNOSIS — R000 Tachycardia, unspecified: Secondary | ICD-10-CM

## 2017-03-14 DIAGNOSIS — G8250 Quadriplegia, unspecified: Secondary | ICD-10-CM | POA: Diagnosis present

## 2017-03-14 DIAGNOSIS — I509 Heart failure, unspecified: Secondary | ICD-10-CM | POA: Diagnosis not present

## 2017-03-14 DIAGNOSIS — K219 Gastro-esophageal reflux disease without esophagitis: Secondary | ICD-10-CM | POA: Diagnosis present

## 2017-03-14 DIAGNOSIS — F419 Anxiety disorder, unspecified: Secondary | ICD-10-CM | POA: Diagnosis present

## 2017-03-14 DIAGNOSIS — G809 Cerebral palsy, unspecified: Secondary | ICD-10-CM | POA: Diagnosis present

## 2017-03-14 DIAGNOSIS — E876 Hypokalemia: Secondary | ICD-10-CM | POA: Diagnosis present

## 2017-03-14 DIAGNOSIS — E860 Dehydration: Secondary | ICD-10-CM | POA: Diagnosis present

## 2017-03-14 DIAGNOSIS — R0602 Shortness of breath: Secondary | ICD-10-CM | POA: Diagnosis not present

## 2017-03-14 DIAGNOSIS — J208 Acute bronchitis due to other specified organisms: Secondary | ICD-10-CM | POA: Diagnosis not present

## 2017-03-14 DIAGNOSIS — E872 Acidosis: Secondary | ICD-10-CM | POA: Diagnosis present

## 2017-03-14 DIAGNOSIS — H409 Unspecified glaucoma: Secondary | ICD-10-CM | POA: Diagnosis present

## 2017-03-14 DIAGNOSIS — Z7984 Long term (current) use of oral hypoglycemic drugs: Secondary | ICD-10-CM | POA: Diagnosis not present

## 2017-03-14 DIAGNOSIS — R509 Fever, unspecified: Secondary | ICD-10-CM | POA: Diagnosis not present

## 2017-03-14 DIAGNOSIS — I10 Essential (primary) hypertension: Secondary | ICD-10-CM | POA: Diagnosis not present

## 2017-03-14 DIAGNOSIS — E785 Hyperlipidemia, unspecified: Secondary | ICD-10-CM | POA: Diagnosis present

## 2017-03-14 DIAGNOSIS — R1310 Dysphagia, unspecified: Secondary | ICD-10-CM | POA: Diagnosis not present

## 2017-03-14 DIAGNOSIS — I5032 Chronic diastolic (congestive) heart failure: Secondary | ICD-10-CM | POA: Diagnosis not present

## 2017-03-14 DIAGNOSIS — J9601 Acute respiratory failure with hypoxia: Secondary | ICD-10-CM | POA: Diagnosis present

## 2017-03-14 LAB — GLUCOSE, CAPILLARY
Glucose-Capillary: 142 mg/dL — ABNORMAL HIGH (ref 65–99)
Glucose-Capillary: 134 mg/dL — ABNORMAL HIGH (ref 65–99)

## 2017-03-14 LAB — RESPIRATORY PANEL BY PCR
Adenovirus: NOT DETECTED
BORDETELLA PERTUSSIS-RVPCR: NOT DETECTED
CORONAVIRUS 229E-RVPPCR: NOT DETECTED
CORONAVIRUS HKU1-RVPPCR: NOT DETECTED
Chlamydophila pneumoniae: NOT DETECTED
Coronavirus NL63: NOT DETECTED
Coronavirus OC43: NOT DETECTED
INFLUENZA B-RVPPCR: NOT DETECTED
Influenza A: NOT DETECTED
METAPNEUMOVIRUS-RVPPCR: NOT DETECTED
MYCOPLASMA PNEUMONIAE-RVPPCR: NOT DETECTED
PARAINFLUENZA VIRUS 2-RVPPCR: NOT DETECTED
Parainfluenza Virus 1: NOT DETECTED
Parainfluenza Virus 3: NOT DETECTED
Parainfluenza Virus 4: NOT DETECTED
RESPIRATORY SYNCYTIAL VIRUS-RVPPCR: NOT DETECTED
Rhinovirus / Enterovirus: NOT DETECTED

## 2017-03-14 LAB — HIV ANTIBODY (ROUTINE TESTING W REFLEX): HIV SCREEN 4TH GENERATION: NONREACTIVE

## 2017-03-14 LAB — PROCALCITONIN: PROCALCITONIN: 1.53 ng/mL

## 2017-03-14 LAB — CBG MONITORING, ED
GLUCOSE-CAPILLARY: 180 mg/dL — AB (ref 65–99)
GLUCOSE-CAPILLARY: 172 mg/dL — AB (ref 65–99)
Glucose-Capillary: 201 mg/dL — ABNORMAL HIGH (ref 65–99)

## 2017-03-14 LAB — BRAIN NATRIURETIC PEPTIDE: B NATRIURETIC PEPTIDE 5: 21.6 pg/mL (ref 0.0–100.0)

## 2017-03-14 LAB — STREP PNEUMONIAE URINARY ANTIGEN: STREP PNEUMO URINARY ANTIGEN: NEGATIVE

## 2017-03-14 LAB — LACTIC ACID, PLASMA: LACTIC ACID, VENOUS: 1.8 mmol/L (ref 0.5–1.9)

## 2017-03-14 MED ORDER — ROSUVASTATIN CALCIUM 10 MG PO TABS
5.0000 mg | ORAL_TABLET | Freq: Every day | ORAL | Status: DC
  Administered 2017-03-15 – 2017-03-17 (×3): 5 mg via ORAL
  Filled 2017-03-14 (×4): qty 1

## 2017-03-14 MED ORDER — MONTELUKAST SODIUM 10 MG PO TABS
10.0000 mg | ORAL_TABLET | Freq: Every morning | ORAL | Status: DC
  Administered 2017-03-15 – 2017-03-18 (×4): 10 mg via ORAL
  Filled 2017-03-14 (×5): qty 1

## 2017-03-14 MED ORDER — GUAIFENESIN ER 600 MG PO TB12
1200.0000 mg | ORAL_TABLET | Freq: Two times a day (BID) | ORAL | Status: DC
  Administered 2017-03-14 – 2017-03-18 (×8): 1200 mg via ORAL
  Filled 2017-03-14 (×8): qty 2

## 2017-03-14 MED ORDER — IPRATROPIUM BROMIDE 0.02 % IN SOLN: 0.5000 mg | RESPIRATORY_TRACT | Status: DC

## 2017-03-14 MED ORDER — SODIUM CHLORIDE 0.9 % IV SOLN
INTRAVENOUS | Status: DC
Start: 2017-03-14 — End: 2017-03-15
  Administered 2017-03-14 (×2): via INTRAVENOUS

## 2017-03-14 MED ORDER — INSULIN ASPART 100 UNIT/ML ~~LOC~~ SOLN
0.0000 [IU] | Freq: Three times a day (TID) | SUBCUTANEOUS | Status: DC
  Administered 2017-03-14: 3 [IU] via SUBCUTANEOUS
  Administered 2017-03-14: 1 [IU] via SUBCUTANEOUS
  Administered 2017-03-14: 2 [IU] via SUBCUTANEOUS
  Administered 2017-03-15: 1 [IU] via SUBCUTANEOUS
  Administered 2017-03-15 (×2): 2 [IU] via SUBCUTANEOUS
  Administered 2017-03-16 (×2): 3 [IU] via SUBCUTANEOUS
  Administered 2017-03-16 – 2017-03-17 (×2): 1 [IU] via SUBCUTANEOUS
  Administered 2017-03-17: 3 [IU] via SUBCUTANEOUS
  Administered 2017-03-18: 1 [IU] via SUBCUTANEOUS
  Administered 2017-03-18: 2 [IU] via SUBCUTANEOUS
  Filled 2017-03-14 (×2): qty 1

## 2017-03-14 MED ORDER — ONDANSETRON HCL 4 MG/2ML IJ SOLN: 4.0000 mg | Freq: Three times a day (TID) | INTRAMUSCULAR | Status: DC | PRN

## 2017-03-14 MED ORDER — ZOLPIDEM TARTRATE 5 MG PO TABS: 5.0000 mg | ORAL_TABLET | Freq: Every evening | ORAL | Status: DC | PRN

## 2017-03-14 MED ORDER — FLUTICASONE PROPIONATE 50 MCG/ACT NA SUSP: 1.0000 | Freq: Every day | NASAL | Status: DC | PRN

## 2017-03-14 MED ORDER — PANTOPRAZOLE SODIUM 40 MG PO TBEC
40.0000 mg | DELAYED_RELEASE_TABLET | Freq: Every day | ORAL | Status: DC
  Administered 2017-03-14 – 2017-03-18 (×5): 40 mg via ORAL
  Filled 2017-03-14 (×5): qty 1

## 2017-03-14 MED ORDER — ACETAMINOPHEN 325 MG PO TABS: 650.0000 mg | ORAL_TABLET | Freq: Four times a day (QID) | ORAL | Status: DC | PRN

## 2017-03-14 MED ORDER — LATANOPROST 0.005 % OP SOLN
1.0000 [drp] | Freq: Every day | OPHTHALMIC | Status: DC
  Administered 2017-03-15 – 2017-03-17 (×3): 1 [drp] via OPHTHALMIC
  Filled 2017-03-14 (×2): qty 2.5

## 2017-03-14 MED ORDER — LEVOFLOXACIN IN D5W 750 MG/150ML IV SOLN
750.0000 mg | INTRAVENOUS | Status: DC
  Administered 2017-03-14: 750 mg via INTRAVENOUS
  Filled 2017-03-14: qty 150

## 2017-03-14 MED ORDER — SODIUM CHLORIDE 0.9 % IV SOLN: INTRAVENOUS | Status: DC

## 2017-03-14 MED ORDER — IPRATROPIUM BROMIDE 0.02 % IN SOLN
0.5000 mg | Freq: Four times a day (QID) | RESPIRATORY_TRACT | Status: DC
  Administered 2017-03-14 – 2017-03-15 (×4): 0.5 mg via RESPIRATORY_TRACT
  Filled 2017-03-14 (×4): qty 2.5

## 2017-03-14 MED ORDER — DM-GUAIFENESIN ER 30-600 MG PO TB12: 1.0000 | ORAL_TABLET | Freq: Two times a day (BID) | ORAL | Status: DC | PRN

## 2017-03-14 MED ORDER — NITROGLYCERIN 0.4 MG SL SUBL: 0.4000 mg | SUBLINGUAL_TABLET | SUBLINGUAL | Status: DC | PRN

## 2017-03-14 MED ORDER — HYDRALAZINE HCL 20 MG/ML IJ SOLN: 5.0000 mg | INTRAMUSCULAR | Status: DC | PRN

## 2017-03-14 MED ORDER — LEVALBUTEROL HCL 1.25 MG/0.5ML IN NEBU
1.2500 mg | INHALATION_SOLUTION | Freq: Four times a day (QID) | RESPIRATORY_TRACT | Status: DC
  Administered 2017-03-14 – 2017-03-15 (×5): 1.25 mg via RESPIRATORY_TRACT
  Filled 2017-03-14 (×8): qty 0.5

## 2017-03-14 MED ORDER — INSULIN ASPART 100 UNIT/ML ~~LOC~~ SOLN
0.0000 [IU] | Freq: Every day | SUBCUTANEOUS | Status: DC
  Administered 2017-03-15 – 2017-03-16 (×2): 2 [IU] via SUBCUTANEOUS

## 2017-03-14 MED ORDER — TIMOLOL MALEATE 0.5 % OP SOLN
1.0000 [drp] | Freq: Two times a day (BID) | OPHTHALMIC | Status: DC
  Administered 2017-03-14 – 2017-03-18 (×8): 1 [drp] via OPHTHALMIC
  Filled 2017-03-14 (×2): qty 5

## 2017-03-14 MED ORDER — ENOXAPARIN SODIUM 40 MG/0.4ML ~~LOC~~ SOLN
40.0000 mg | SUBCUTANEOUS | Status: DC
  Administered 2017-03-14 – 2017-03-17 (×4): 40 mg via SUBCUTANEOUS
  Filled 2017-03-14 (×4): qty 0.4

## 2017-03-14 MED ORDER — METHYLPREDNISOLONE SODIUM SUCC 125 MG IJ SOLR
60.0000 mg | Freq: Three times a day (TID) | INTRAMUSCULAR | Status: DC
  Administered 2017-03-14 – 2017-03-16 (×7): 60 mg via INTRAVENOUS
  Filled 2017-03-14 (×7): qty 2

## 2017-03-14 NOTE — ED Notes (Signed)
Caregiver at the bedside. 

## 2017-03-14 NOTE — Progress Notes (Signed)
03/14/2017 1615 Received pt to room 4E-22 from ED.  Pt is A&O.  No C/O voiced other than being hungry.  Explained to pt he needed a swallow eval before we could feed him and that may not be completed until tomorrow.  Tele monitor applied and CCMD notified.  CHG bath given.  Oriented to room, call light and bed.  Call bell in reach.   Carney Corners

## 2017-03-14 NOTE — Progress Notes (Signed)
Pharmacy Antibiotic Note  Thomas Ramos is a 46 y.o. male admitted on 03/13/2017 with sepsis and asthma exacerbation.  Pharmacy has been consulted for Levaquin dosing.  Plan: Levaquin 750mg  IV Q24H.  Temp (24hrs), Avg:102.1 F (38.9 C), Min:102.1 F (38.9 C), Max:102.1 F (38.9 C)  Recent Labs  Lab 03/13/17 1834 03/13/17 1850 03/13/17 2112  WBC 16.6*  --   --   CREATININE 0.82  --   --   LATICACIDVEN  --  2.34* 2.67*     Allergies  Allergen Reactions  . Sulfamethoxazole-Trimethoprim Nausea And Vomiting  . Metronidazole Nausea And Vomiting  . Penicillins Hives, Nausea And Vomiting and Other (See Comments)    Patient tolerated cefazolin in 2017 Has patient had a PCN reaction causing immediate rash, facial/tongue/throat swelling, SOB or lightheadedness with hypotension: Yes Has patient had a PCN reaction causing severe rash involving mucus membranes or skin necrosis: No Has patient had a PCN reaction that required hospitalization No Has patient had a PCN reaction occurring within the last 10 years: Yes If all of the above answers are "NO", then may proceed with Cephalosporin use.     Thank you for allowing pharmacy to be a part of this patient's care.  Wynona Neat, PharmD, BCPS  03/14/2017 3:49 AM

## 2017-03-14 NOTE — ED Notes (Signed)
Pt CBG was 201, notified Ruth(RN)

## 2017-03-14 NOTE — ED Notes (Signed)
Pt CBG was 172, notified Ruth(RN)

## 2017-03-14 NOTE — Progress Notes (Signed)
PROGRESS NOTE    Thomas Ramos  EUM:[id number] DOB: [birth year] DOA: 03/13/2017 PCP: Chesley Noon, MD   Brief Narrative:  Thomas Ramos is a 46 y.o. male with medical history significant of cerebral palsy, quadriplegia, CHF, hypertension, hyperlipidemia, diabetes mellitus, asthma, GERD, anxiety, who presents with cough and shortness of breath.  Patient states that he has been having cough and shortness of breath since yesterday. He coughs up brownish colored sputum. He denies chest pain. He also has fever and chills. He has runny nose and sore throat. Patient has nausea and vomited twice, and had one loose stool bowel movement. Denies abdominal pain. No symptoms of UTI. Per report, patient had hypotension with systolic pressure 54M, which improved to 118/64 after 500 mL normal saline bolus.   ED Course: pt was found to have WBC 16.6, lactic acid 2.67, electrolytes renal function okay, negative flu PCR, negative urinalysis, temperature 102.1, tachycardia, tachypnea, current blood pressure 96/51, chest x-ray showed hypoaeration without infiltration. Patient is admitted to stepdown as inpatient.  Assessment & Plan:   Principal Problem:   Acute respiratory failure with hypoxia (HCC) Active Problems:   Asthma exacerbation   GERD (gastroesophageal reflux disease)   Chronic diastolic (congestive) heart failure (HCC)   Essential hypertension   Sepsis ([place])   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HCC)  Acute Respiratory Failure with Hypoxia due to Asthma exacerbation and sepsis from unclear Etiology at this point  -Chest x-ray negative for infiltration but was dehydrated so will rehydrate and repeat CXR in AM  -Patient meets Sepsis criteria with leukocytosis, tachycardia, tachypnea, fever. Blood pressure is soft. Lactic acid is 2.67, currently hemodynamically stable. -Admitted to SDU as inpt -C/w Scheduled Atrovent/Xopenex and prn Nebs; Given IV Nebs with Alubterol in ED -Solu-Medrol 60 mg IV  q8h  -C/w IV Levaquin (Patient also received one dose of vancomycin and aztreonam in ED) -Mucinex DM PRN for cough and Scheduled Mucinex -Urine S. pneumococcal antigen Negative -Follow up blood culture x2 show NGTD <24 Hours,  -Sputum culture from expectorated Sputum -Respiratory virus panel, Flu pcr  Both Negative so D/C'd Droplet Precautions -Procalcitonin elevated at 1.53 and trend lactic acid levels per sepsis protocol. -IVF: 2L of NS bolus in ED, followed by 100 cc/h and now decreased to 75 mL/hr -Urinalysis unremarkable -WBC was elevated at 16.6 -Continue to Monitor and Repeat CBC in AM -Get Swallow Eval as patient was vomiting and ? Aspiration  Lactic Acidosis -Likely from Above -Lactic Acid Level went from 2.34 -> 2.67 -> 1.8 -C/w IVF as below   GERD/Motility Disorder and Hx of Esophageal Stricture  -C/w Pantoprazole 40 mg po Daily  HTN -Hold Bp med due to soft blood pressure -C/w IV hydralazine when necessary  Chronic Diastolic (congestive) Heart Failure ([place]) -2-D echo on 07/23/15 showed EF of 55-60% with grade 2 diastolic dysfunction.  -Patient has trace leg edema, but no JVD.  -CHF seems to be compensated. -Hold Home Lasix Currently and Rehydrate with NS at 100 mL/hr initially and change to 75 mL/hr and re-evaluate Need in AM -BNP was 21.6 -Strict I's/O's Daily Weights   Quadriplegic Spinal Paralysis  -No acute issues and gets Botox Injections q61months -Not on any muscle relaxants  Cerebral Palsy -Stable  Glaucoma -C/w Latanoprost 1 drop Both Eyes qHS and Timoolol 1 drop q12h  HLD -C/w Rosuvastatin 5 mg po qHS  Diabetes Mellitus -C/w Sensitive Novolog SSI AC/HS -Check HbA1c -Continue to Monitor CBG's; CBG's ranging from 134-201  Seasonal Allergies -C/w Montelukast 10  mg po and Fluticasone -C/w Nebs as Above   DVT prophylaxis: Enoxaparin 40 mg sq q24h Code Status: FULL CODE Family Communication: No family present at bedside Disposition Plan:  Remain Inpatient for continued Treatment  Consultants:  None   Procedures: None   Antimicrobials: Anti-infectives (From admission, onward)   Start     Dose/Rate Route Frequency Ordered Stop   03/14/17 2100  levofloxacin (LEVAQUIN) IVPB 750 mg     750 mg 100 mL/hr over 90 Minutes Intravenous Every 24 hours 03/14/17 0350     03/13/17 2045  vancomycin (VANCOCIN) 1,500 mg in sodium chloride 0.9 % 500 mL IVPB     1,500 mg 250 mL/hr over 120 Minutes Intravenous  Once 03/13/17 2039 03/13/17 2339   03/13/17 2015  levofloxacin (LEVAQUIN) IVPB 750 mg     750 mg 100 mL/hr over 90 Minutes Intravenous  Once 03/13/17 2013 03/13/17 2213   03/13/17 2015  aztreonam (AZACTAM) 2 g in dextrose 5 % 50 mL IVPB     2 g 100 mL/hr over 30 Minutes Intravenous  Once 03/13/17 2013 03/13/17 2113   03/13/17 2015  vancomycin (VANCOCIN) IVPB 1000 mg/200 mL premix  Status:  Discontinued     1,000 mg 200 mL/hr over 60 Minutes Intravenous  Once 03/13/17 2013 03/13/17 2039     Subjective: Seen and examined and states he had been vomiting and coughing up a lot of sputum. States he was SOB. Wanting to Eat no Lightheadedness or dizziness.   Objective: Vitals:   03/14/17 1700 03/14/17 1812 03/14/17 1949 03/14/17 1959  BP: (!) 96/52 (!) 104/58 (!) 93/48   Pulse: 67  (!) 51 (!) 49  Resp: 20  (!) 23 (!) 26  Temp:   97.6 F (36.4 C)   TempSrc:   Oral   SpO2:  93% 94% 93%  Weight:        Intake/Output Summary (Last 24 hours) at 03/14/2017 2053 Last data filed at 03/14/2017 1826 Gross per 24 hour  Intake 1200 ml  Output 650 ml  Net 550 ml   Filed Weights   03/14/17 1628  Weight: 76.1 kg (167 lb 12.3 oz)   Examination: Physical Exam:  Constitutional: WN/WD Caucasian paraplegic male in NAD and appears calm  Eyes: Lids and conjunctivae normal, sclerae anicteric  ENMT: External Ears, Nose appear normal. Grossly normal hearing. Mucous membranes are slightly dry Neck: Appears normal, supple, no cervical  masses, normal ROM, no appreciable thyromegaly; no JVD Respiratory: Diminished to auscultation bilaterally with rhonchi and mild wheezing; No rales, rhonchi or crackles. Sliightly increased respiratory effort and patient is not tachypenic. No accessory muscle use.  Cardiovascular: NSR , no appreciable murmurs / rubs / gallops. S1 and S2 auscultated. Mild extremity edema. Abdomen: Soft, mildly tender, Distended due to body habitus. No masses palpated. No appreciable hepatosplenomegaly. Bowel sounds positive x5.  GU: Deferred. Musculoskeletal: Patient is quadriplegic with contractures and atrophy. Able to move left arm but not very well.  Skin: No rashes, lesions, ulcers on a limited skin eval. No induration; Warm and dry.  Neurologic: CN 2-12 grossly intact. Romberg sign and cerebellar reflexes not assessed.  Psychiatric: Normal judgment and insight. Alert and oriented x 3. Very pleasant mood mood and appropriate affect.   Data Reviewed: I have personally reviewed following labs and imaging studies  CBC: Recent Labs  Lab 03/13/17 1834  WBC 16.6*  NEUTROABS 14.0*  HGB 12.4*  HCT 37.7*  MCV 82.0  PLT 188   Basic Metabolic Panel:  Recent Labs  Lab 03/13/17 1834  NA 139  K 3.9  CL 104  CO2 22  GLUCOSE 162*  BUN 20  CREATININE 0.82  CALCIUM 9.0   GFR: CrCl cannot be calculated (Unknown ideal weight.). Liver Function Tests: Recent Labs  Lab 03/13/17 1834  AST 20  ALT 18  ALKPHOS 127*  BILITOT 0.4  PROT 7.2  ALBUMIN 4.0   No results for input(s): LIPASE, AMYLASE in the last 168 hours. No results for input(s): AMMONIA in the last 168 hours. Coagulation Profile: Recent Labs  Lab 03/13/17 1834  INR 1.06   Cardiac Enzymes: No results for input(s): CKTOTAL, CKMB, CKMBINDEX, TROPONINI in the last 168 hours. BNP (last 3 results) No results for input(s): PROBNP in the last 8760 hours. HbA1C: No results for input(s): HGBA1C in the last 72 hours. CBG: Recent Labs  Lab  03/14/17 0230 03/14/17 0841 03/14/17 1249 03/14/17 1631  GLUCAP 180* 201* 172* 142*   Lipid Profile: No results for input(s): CHOL, HDL, LDLCALC, TRIG, CHOLHDL, LDLDIRECT in the last 72 hours. Thyroid Function Tests: No results for input(s): TSH, T4TOTAL, FREET4, T3FREE, THYROIDAB in the last 72 hours. Anemia Panel: No results for input(s): VITAMINB12, FOLATE, FERRITIN, TIBC, IRON, RETICCTPCT in the last 72 hours. Sepsis Labs: Recent Labs  Lab 03/13/17 1850 03/13/17 2112 03/14/17 0309  PROCALCITON  --   --  1.53  LATICACIDVEN 2.34* 2.67* 1.8    Recent Results (from the past 240 hour(s))  Culture, blood (Routine x 2)     Status: None (Preliminary result)   Collection Time: 03/13/17  6:35 PM  Result Value Ref Range Status   Specimen Description BLOOD BLOOD RIGHT HAND  Final   Special Requests   Final    BOTTLES DRAWN AEROBIC AND ANAEROBIC Blood Culture adequate volume   Culture NO GROWTH < 24 HOURS  Final   Report Status PENDING  Incomplete  Culture, blood (Routine x 2)     Status: None (Preliminary result)   Collection Time: 03/13/17  6:40 PM  Result Value Ref Range Status   Specimen Description BLOOD BLOOD RIGHT WRIST  Final   Special Requests   Final    BOTTLES DRAWN AEROBIC AND ANAEROBIC Blood Culture adequate volume   Culture NO GROWTH < 24 HOURS  Final   Report Status PENDING  Incomplete  Respiratory Panel by PCR     Status: None   Collection Time: 03/13/17  8:42 PM  Result Value Ref Range Status   Adenovirus NOT DETECTED NOT DETECTED Final   Coronavirus 229E NOT DETECTED NOT DETECTED Final   Coronavirus HKU1 NOT DETECTED NOT DETECTED Final   Coronavirus NL63 NOT DETECTED NOT DETECTED Final   Coronavirus OC43 NOT DETECTED NOT DETECTED Final   Metapneumovirus NOT DETECTED NOT DETECTED Final   Rhinovirus / Enterovirus NOT DETECTED NOT DETECTED Final   Influenza A NOT DETECTED NOT DETECTED Final   Influenza B NOT DETECTED NOT DETECTED Final   Parainfluenza Virus 1  NOT DETECTED NOT DETECTED Final   Parainfluenza Virus 2 NOT DETECTED NOT DETECTED Final   Parainfluenza Virus 3 NOT DETECTED NOT DETECTED Final   Parainfluenza Virus 4 NOT DETECTED NOT DETECTED Final   Respiratory Syncytial Virus NOT DETECTED NOT DETECTED Final   Bordetella pertussis NOT DETECTED NOT DETECTED Final   Chlamydophila pneumoniae NOT DETECTED NOT DETECTED Final   Mycoplasma pneumoniae NOT DETECTED NOT DETECTED Final    Radiology Studies: Dg Chest Portable 1 View  Result Date: 03/13/2017 CLINICAL DATA:  Shortness of Breath EXAM: PORTABLE CHEST 1 VIEW COMPARISON:  11/09/2016 FINDINGS: Cardiac shadow is stable. The lungs are hypoaerated bilaterally. Elevation of the right hemidiaphragm is again seen. No focal infiltrate is noted. No acute bony abnormality is seen. IMPRESSION: Hypoaeration.  No acute abnormality noted. Electronically Signed   By: Inez Catalina M.D.   On: 03/13/2017 19:49   Scheduled Meds: . enoxaparin (LOVENOX) injection  40 mg Subcutaneous Q24H  . insulin aspart  0-5 Units Subcutaneous QHS  . insulin aspart  0-9 Units Subcutaneous TID WC  . ipratropium  0.5 mg Nebulization Q6H  . latanoprost  1 drop Both Eyes QHS  . levalbuterol  1.25 mg Nebulization Q6H  . methylPREDNISolone (SOLU-MEDROL) injection  60 mg Intravenous Q8H  . montelukast  10 mg Oral q morning - 10a  . pantoprazole  40 mg Oral Daily  . rosuvastatin  5 mg Oral QHS  . timolol  1 drop Both Eyes Q12H   Continuous Infusions: . sodium chloride 100 mL/hr at 03/14/17 1405  . levofloxacin (LEVAQUIN) IV      LOS: 0 days   Kerney Elbe, DO [facility] Pager [phone]  If 7PM-7AM, please contact night-coverage [email or web address] Password [facility] 03/14/2017, 8:53 PM

## 2017-03-15 ENCOUNTER — Inpatient Hospital Stay (HOSPITAL_COMMUNITY): Payer: Medicare Other

## 2017-03-15 ENCOUNTER — Other Ambulatory Visit: Payer: Self-pay

## 2017-03-15 DIAGNOSIS — I509 Heart failure, unspecified: Secondary | ICD-10-CM

## 2017-03-15 DIAGNOSIS — I5033 Acute on chronic diastolic (congestive) heart failure: Secondary | ICD-10-CM

## 2017-03-15 DIAGNOSIS — J208 Acute bronchitis due to other specified organisms: Secondary | ICD-10-CM

## 2017-03-15 DIAGNOSIS — R1310 Dysphagia, unspecified: Secondary | ICD-10-CM

## 2017-03-15 LAB — CBC WITH DIFFERENTIAL/PLATELET
BASOS ABS: 0 10*3/uL (ref 0.0–0.1)
Basophils Relative: 0 %
Eosinophils Absolute: 0 10*3/uL (ref 0.0–0.7)
Eosinophils Relative: 0 %
HEMATOCRIT: 30.5 % — AB (ref 39.0–52.0)
HEMOGLOBIN: 9.7 g/dL — AB (ref 13.0–17.0)
LYMPHS PCT: 13 %
Lymphs Abs: 0.8 10*3/uL (ref 0.7–4.0)
MCH: 26.4 pg (ref 26.0–34.0)
MCHC: 31.8 g/dL (ref 30.0–36.0)
MCV: 83.1 fL (ref 78.0–100.0)
MONO ABS: 0.2 10*3/uL (ref 0.1–1.0)
MONOS PCT: 3 %
NEUTROS ABS: 5.4 10*3/uL (ref 1.7–7.7)
NEUTROS PCT: 84 %
Platelets: 159 10*3/uL (ref 150–400)
RBC: 3.67 MIL/uL — ABNORMAL LOW (ref 4.22–5.81)
RDW: 13.7 % (ref 11.5–15.5)
WBC: 6.4 10*3/uL (ref 4.0–10.5)

## 2017-03-15 LAB — URINE CULTURE: Culture: <10000 — AB

## 2017-03-15 LAB — COMPREHENSIVE METABOLIC PANEL
ALBUMIN: 2.9 g/dL — AB (ref 3.5–5.0)
ALT: 15 U/L — AB (ref 17–63)
AST: 21 U/L (ref 15–41)
Alkaline Phosphatase: 81 U/L (ref 38–126)
Anion gap: 8 (ref 5–15)
BUN: 18 mg/dL (ref 6–20)
CHLORIDE: 110 mmol/L (ref 101–111)
CO2: 20 mmol/L — AB (ref 22–32)
CREATININE: 0.6 mg/dL — AB (ref 0.61–1.24)
Calcium: 8.6 mg/dL — ABNORMAL LOW (ref 8.9–10.3)
GFR calc Af Amer: >60 mL/min (ref >60)
GFR calc non Af Amer: >60 mL/min (ref >60)
Glucose, Bld: 155 mg/dL — ABNORMAL HIGH (ref 65–99)
POTASSIUM: 3.8 mmol/L (ref 3.5–5.1)
SODIUM: 138 mmol/L (ref 135–145)
Total Bilirubin: 0.6 mg/dL (ref 0.3–1.2)
Total Protein: 5.9 g/dL — ABNORMAL LOW (ref 6.5–8.1)

## 2017-03-15 LAB — ECHOCARDIOGRAM COMPLETE: WEIGHTICAEL: 2684.32 [oz_av]

## 2017-03-15 LAB — GLUCOSE, CAPILLARY
GLUCOSE-CAPILLARY: 188 mg/dL — AB (ref 65–99)
Glucose-Capillary: 143 mg/dL — ABNORMAL HIGH (ref 65–99)
Glucose-Capillary: 195 mg/dL — ABNORMAL HIGH (ref 65–99)
Glucose-Capillary: 225 mg/dL — ABNORMAL HIGH (ref 65–99)

## 2017-03-15 LAB — HEMOGLOBIN A1C
Hgb A1c MFr Bld: 6.8 % — ABNORMAL HIGH (ref 4.8–5.6)
MEAN PLASMA GLUCOSE: 148.46 mg/dL

## 2017-03-15 LAB — MAGNESIUM: Magnesium: 2.2 mg/dL (ref 1.7–2.4)

## 2017-03-15 LAB — PHOSPHORUS: Phosphorus: 2.8 mg/dL (ref 2.5–4.6)

## 2017-03-15 MED ORDER — IPRATROPIUM-ALBUTEROL 0.5-2.5 (3) MG/3ML IN SOLN
3.0000 mL | Freq: Four times a day (QID) | RESPIRATORY_TRACT | Status: DC
  Administered 2017-03-15 – 2017-03-17 (×10): 3 mL via RESPIRATORY_TRACT
  Filled 2017-03-15 (×10): qty 3

## 2017-03-15 MED ORDER — RESOURCE THICKENUP CLEAR PO POWD
ORAL | Status: DC | PRN
  Filled 2017-03-15 (×2): qty 125

## 2017-03-15 MED ORDER — FUROSEMIDE 10 MG/ML IJ SOLN
40.0000 mg | Freq: Every day | INTRAMUSCULAR | Status: DC
  Administered 2017-03-15 – 2017-03-18 (×4): 40 mg via INTRAVENOUS
  Filled 2017-03-15 (×4): qty 4

## 2017-03-15 MED ORDER — ALBUTEROL SULFATE (2.5 MG/3ML) 0.083% IN NEBU: 2.5000 mg | INHALATION_SOLUTION | RESPIRATORY_TRACT | Status: DC | PRN

## 2017-03-15 MED ORDER — LEVOFLOXACIN 750 MG PO TABS
750.0000 mg | ORAL_TABLET | ORAL | Status: DC
  Administered 2017-03-15 – 2017-03-16 (×2): 750 mg via ORAL
  Filled 2017-03-15 (×2): qty 1

## 2017-03-15 NOTE — Progress Notes (Signed)
Modified Barium Swallow Progress Note  Patient Details  Name: Thomas Ramos MRN: [id number] Date of Birth: 09-02-1970  Today's Date: 03/15/2017  Modified Barium Swallow completed.  Full report located under Chart Review in the Imaging Section.  Brief recommendations include the following:  Clinical Impression  Pt exhibited minimal pharyngeal dysphagia marked by consistent penetration with thin, unsensed. View of pyriform sinuses and larynx obstructed somewhat by shoulder therefore unable to determine if complete clearance achieved with cued cough/throat clear. No significant pharyngeal residue and timely swallow initiation. Esophageal scan did not reveal overt abnormalities although MBS does not diagnose esophageal impairments. Recommend nectar thick for a definitive amount of time primarily due to silent nature of penetration until strength and overall health improves, possible home health ST. Dys 3 texture recommended.       Swallow Evaluation Recommendations       SLP Diet Recommendations: Dysphagia 3 (Mech soft) solids;Nectar thick liquid   Liquid Administration via: Straw;Cup   Medication Administration: Whole meds with puree   Supervision: Patient able to self feed;Intermittent supervision to cue for compensatory strategies   Compensations: Slow rate;Small sips/bites;Clear throat intermittently(cough intermittently)   Postural Changes: Seated upright at 90 degrees;Remain semi-upright after after feeds/meals (Comment)   Oral Care Recommendations: Oral care BID   Other Recommendations: Order thickener from pharmacy    [name] 03/15/2017,12:47 PM   [name].Ed [employer] [phone]

## 2017-03-15 NOTE — Progress Notes (Signed)
*  PRELIMINARY RESULTS* Echocardiogram 2D Echocardiogram has been performed.  Thomas Ramos 03/15/2017, 4:09 PM

## 2017-03-15 NOTE — Evaluation (Signed)
Clinical/Bedside Swallow Evaluation Patient Details  Name: Thomas Ramos MRN: [id number] Date of Birth: 1970/08/08  Today's Date: 03/15/2017 Time: SLP Start Time (ACUTE ONLY): 0848 SLP Stop Time (ACUTE ONLY): 0905 SLP Time Calculation (min) (ACUTE ONLY): 17 min  Past Medical History:  Past Medical History:  Diagnosis Date  . Anxiety   . Cerebral palsy ([place])   . Chronic diastolic (congestive) heart failure ([place])   . CP (cerebral palsy) ([place])   . Diabetes mellitus without complication (HCC)    borderline  . Environmental allergies    takes inhalers if needed  . Esophageal stricture   . GERD (gastroesophageal reflux disease)   . Hypertension   . Motility disorder, esophageal   . Pneumonia   . 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[place])   . S/P Botox injection    approx every 4 months  . Seasonal allergies    Past Surgical History:  Past Surgical History:  Procedure Laterality Date  . BOTOX INJECTION N/A 12/21/2012   Procedure: BOTOX INJECTION;  Surgeon: Inda Castle, MD;  Location: [facility];  Service: Endoscopy;  Laterality: N/A;  . BOTOX INJECTION N/A 06/28/2014   Procedure: BOTOX INJECTION;  Surgeon: Inda Castle, MD;  Location: [place] [place];  Service: Endoscopy;  Laterality: N/A;  . ESOPHAGOGASTRODUODENOSCOPY N/A 12/21/2012   Procedure: ESOPHAGOGASTRODUODENOSCOPY (EGD);  Surgeon: Inda Castle, MD;  Location: [facility];  Service: Endoscopy;  Laterality: N/A;  . ESOPHAGOGASTRODUODENOSCOPY N/A 06/28/2014   Procedure: ESOPHAGOGASTRODUODENOSCOPY (EGD);  Surgeon: Inda Castle, MD;  Location: [place];  Service: Endoscopy;  Laterality: N/A;  . ESOPHAGUS SURGERY     stretched esophagus  . legs    . MOUTH SURGERY    . TENDON RELEASE     HPI:  Thomas Ramos a 46 y.o.malewith medical history significant ofcerebral palsy, quadriplegia, CHF, hypertension, hyperlipidemia, diabetes mellitus, asthma, GERD, esophageal stricutre, esophageal motility disorder, pna,  anxiety, who presents with cough and shortness of breath. Found to have asthma exacerbation and sepsis from unclear etiology. Chest x-ray showed hypoaeration without infiltration. MBS 05/26/14, flash penetration, vallecular residue and rec'd to swallow twice and clear throat. BSE 10/07/15 recommended Dys 3, thin.    Assessment / Plan / Recommendation Clinical Impression  Suspect decreased airway protection with clinical observations of immediate throat clear and delayed cough in setting of chronic mild pharyngeal dysphagia with CP and per prior MBS. He stated he has a swallow eval scheduled the 22nd of next month. Recommend MBS, scheduled today at 10:00.  SLP Visit Diagnosis: Dysphagia, unspecified (R13.10)    Aspiration Risk  Moderate aspiration risk    Diet Recommendation NPO        Other  Recommendations Oral Care Recommendations: Oral care BID   Follow up Recommendations (TBD)      Frequency and Duration            Prognosis        Swallow Study   General HPI: Thomas Ramos a 46 y.o.malewith medical history significant ofcerebral palsy, quadriplegia, CHF, hypertension, hyperlipidemia, diabetes mellitus, asthma, GERD, esophageal stricutre, esophageal motility disorder, pna, anxiety, who presents with cough and shortness of breath. Found to have asthma exacerbation and sepsis from unclear etiology. Chest x-ray showed hypoaeration without infiltration. MBS 05/26/14, flash penetration, vallecular residue and rec'd to swallow twice and clear throat. BSE 10/07/15 recommended Dys 3, thin.  Type of Study: Bedside Swallow Evaluation Previous Swallow Assessment: (see HPI) Diet Prior to this Study: NPO Temperature Spikes Noted: No Respiratory Status: Nasal  cannula History of Recent Intubation: No Behavior/Cognition: Alert;Cooperative;Pleasant mood;Requires cueing Oral Cavity Assessment: Within Functional Limits Oral Care Completed by SLP: No Vision: Functional for  self-feeding Self-Feeding Abilities: Needs assist;Needs set up Patient Positioning: Upright in bed Baseline Vocal Quality: Normal Volitional Cough: Strong Volitional Swallow: Able to elicit    Oral/Motor/Sensory Function Overall Oral Motor/Sensory Function: Mild impairment Facial ROM: Within Functional Limits Facial Symmetry: Within Functional Limits Lingual Symmetry: Abnormal symmetry right   Ice Chips Ice chips: Not tested   Thin Liquid Thin Liquid: Impaired Presentation: Straw Pharyngeal  Phase Impairments: Change in Vital Signs;Cough - Delayed;Throat Clearing - Immediate(increased work of breathing)    Nectar Thick Nectar Thick Liquid: Not tested   Honey Thick Honey Thick Liquid: Not tested   Puree Puree: Impaired Pharyngeal Phase Impairments: Change in Vital Signs   Solid   GO   Solid: Not tested        [name] 03/15/2017,9:13 AM  [name].Ed [employer] [phone]

## 2017-03-15 NOTE — Progress Notes (Signed)
PROGRESS NOTE   Thomas Ramos  RWE:[id number]    DOB: [birth year]    DOA: 03/13/2017  PCP: Chesley Noon, MD   I have briefly reviewed patients previous medical records in [facility].  Brief Narrative:  46 year old male with PMH of cerebral palsy, quadriplegia, moves around at home with the help of a wheelchair, chronic diastolic CHF, HTN, HLD, DM, asthma, GERD, anxiety who presented with productive cough, fever, chills, runny nose, sore throat and dyspnea. He was hypotensive in the 80s which improved after normal saline bolus. Admitted for acute respiratory failure with hypoxia due to asthma exacerbation and sepsis.   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 Acute viral bronchitis with asthma exacerbation: Admission chest x-ray without acute findings. May have superadded bacterial infection. Blood cultures 2: Negative to date. RSV panel negative. Urine culture insignificant growth. Urine pneumococcal antigen negative. Pro-calcitonin elevated at 1.53. Empirically placed on IV levofloxacin, IV Solu-Medrol and bronchodilator nebulizations. Current respiratory symptoms and findings more suggestive of acute diastolic CHF, treat as below. 2. Acute on chronic diastolic CHF: Likely precipitated by IV fluid hydration and holding Lasix on admission due to sepsis. TTE 07/23/15: LVEF 55-60 percent and grade 2 diastolic dysfunction. Follow 2-D echo. Chest x-ray personally reviewed and suggestive of CHF. IV Lasix 40 mg daily, strict intake output and daily weights. Monitor closely. 3. Acute respiratory failure with hypoxia: Secondary to problems 1 and 2. Treat as above and wean off oxygen as tolerated for saturations greater than  92%. 4. Sepsis: Met sepsis criteria on admission including leukocytosis, tachycardia, tachypnea and fever with lactate of 2.67. Treated per sepsis criteria with IV fluids and antibiotics. Sepsis physiology resolved. 5. Elevated lactate: Secondary to above. Resolved. 6. Dysphagia: Speech therapy consultation appreciated and have initiated modified diet. Monitor. 7. GERD: Continue pantoprazole. 8. Essential hypertension: Controlled. 9. Cerebral palsy with quadriplegia: Mental status at baseline. Reports that he moves around at home with the help of a wheelchair. 10. Hyperlipidemia: Continue statins. 11. Type II DM: Mildly uncontrolled and fluctuating. Continue SSI. 12. Anemia:? Dilutional. No bleeding reported. Follow CBC in a.m.   DVT prophylaxis: Lovenox Code Status: Full Family Communication: None at bedside Disposition: DC home when medically improved.   Consultants:  None   Procedures:  None  Antimicrobials:  IV levofloxacin    Subjective: Ongoing cough but not much production. Intermittent dyspnea and wheezing. Was nothing by mouth this morning and wanted to start eating and go home as soon as he can. No chest pain. No fever or chills. As per RN apart from above, no acute events.   ROS: As above.  Objective:  Vitals:   03/15/17 0822 03/15/17 1220 03/15/17 1251 03/15/17 1600  BP:  (!) 118/55  (!) 123/58  Pulse: 64     Resp: 20 (!) 29  (!) 22  Temp:  98.1 F (36.7 C)    TempSrc:  Oral    SpO2: 94%  93%   Weight:        Examination:  General exam: Pleasant young male, moderately built and nourished, lying comfortably propped up in bed. Respiratory system: Reduced breath sounds bilaterally with scattered bilateral medium pitched expiratory wheezing and few basal crackles. Respiratory effort normal. Cardiovascular system:  S1 & S2 heard, RRR. No JVD, murmurs, rubs, gallops or clicks. 1+ pitting bilateral leg edema. Telemetry personally reviewed: Mostly sinus rhythm.  Occasional sinus bradycardia in the 40s overnight. Gastrointestinal system: Abdomen is nondistended, soft and nontender. No organomegaly or masses felt. Normal bowel sounds heard. Central nervous system: Alert and oriented 2. No focal neurological deficits. Extremities: Grade 0 x 5 power in lower extremities. At least grade 3 x 5 power in upper extremities. Skin: No rashes, lesions or ulcers Psychiatry: Judgement and insight impaired. Mood & affect pleasant & appropriate.     Data Reviewed: I have personally reviewed following labs and imaging studies  CBC: Recent Labs  Lab 03/13/17 1834 03/15/17 0308  WBC 16.6* 6.4  NEUTROABS 14.0* 5.4  HGB 12.4* 9.7*  HCT 37.7* 30.5*  MCV 82.0 83.1  PLT 256 354   Basic Metabolic Panel: Recent Labs  Lab 03/13/17 1834 03/15/17 0308  NA 139 138  K 3.9 3.8  CL 104 110  CO2 22 20*  GLUCOSE 162* 155*  BUN 20 18  CREATININE 0.82 0.60*  CALCIUM 9.0 8.6*  MG  --  2.2  PHOS  --  2.8   Liver Function Tests: Recent Labs  Lab 03/13/17 1834 03/15/17 0308  AST 20 21  ALT 18 15*  ALKPHOS 127* 81  BILITOT 0.4 0.6  PROT 7.2 5.9*  ALBUMIN 4.0 2.9*   Coagulation Profile: Recent Labs  Lab 03/13/17 1834  INR 1.06   HbA1C: Recent Labs    03/15/17 0308  HGBA1C 6.8*   CBG: Recent Labs  Lab 03/14/17 1631 03/14/17 2102 03/15/17 0600 03/15/17 1130 03/15/17 1644  GLUCAP 142* 134* 143* 188* 195*    Recent Results (from the past 240 hour(s))  Culture, blood (Routine x 2)     Status: None (Preliminary result)   Collection Time: 03/13/17  6:35 PM  Result Value Ref Range Status   Specimen Description BLOOD BLOOD RIGHT HAND  Final   Special Requests   Final    BOTTLES DRAWN AEROBIC AND ANAEROBIC Blood Culture adequate volume   Culture NO GROWTH 2 DAYS  Final   Report Status PENDING  Incomplete  Culture, blood (Routine x 2)     Status: None (Preliminary result)   Collection Time: 03/13/17  6:40 PM  Result Value Ref Range Status    Specimen Description BLOOD BLOOD RIGHT WRIST  Final   Special Requests   Final    BOTTLES DRAWN AEROBIC AND ANAEROBIC Blood Culture adequate volume   Culture NO GROWTH 2 DAYS  Final   Report Status PENDING  Incomplete  Respiratory Panel by PCR     Status: None   Collection Time: 03/13/17  8:42 PM  Result Value Ref Range Status   Adenovirus NOT DETECTED NOT DETECTED Final   Coronavirus 229E NOT DETECTED NOT DETECTED Final   Coronavirus HKU1 NOT DETECTED NOT DETECTED Final   Coronavirus NL63 NOT DETECTED NOT DETECTED Final   Coronavirus OC43 NOT DETECTED NOT DETECTED Final   Metapneumovirus NOT DETECTED NOT DETECTED Final   Rhinovirus / Enterovirus NOT DETECTED NOT DETECTED Final   Influenza A NOT DETECTED NOT DETECTED Final   Influenza B NOT DETECTED NOT DETECTED Final   Parainfluenza Virus 1 NOT DETECTED NOT DETECTED Final   Parainfluenza Virus 2 NOT DETECTED NOT DETECTED Final   Parainfluenza Virus 3 NOT DETECTED NOT DETECTED Final   Parainfluenza Virus 4 NOT DETECTED NOT DETECTED Final   Respiratory Syncytial Virus NOT DETECTED NOT DETECTED Final  Bordetella pertussis NOT DETECTED NOT DETECTED Final   Chlamydophila pneumoniae NOT DETECTED NOT DETECTED Final   Mycoplasma pneumoniae NOT DETECTED NOT DETECTED Final  Urine Culture     Status: Abnormal   Collection Time: 03/13/17 10:57 PM  Result Value Ref Range Status   Specimen Description URINE, CLEAN CATCH  Final   Special Requests NONE  Final   Culture <10,000 COLONIES/mL INSIGNIFICANT GROWTH (A)  Final   Report Status 03/15/2017 FINAL  Final         Radiology Studies: Dg Chest Port 1 View  Result Date: 03/15/2017 CLINICAL DATA:  Respiratory failure, asthma, CHF, sepsis, quadriplegia EXAM: PORTABLE CHEST 1 VIEW COMPARISON:  Portable chest x-ray of March 13, 2017 FINDINGS: The lungs remain hypoinflated. The interstitial markings are increased bilaterally. There is no large pleural effusion and no pneumothorax. There is  no discrete infiltrate. The cardiac silhouette is enlarged. The pulmonary vascularity is indistinct. IMPRESSION: Bilateral hypoinflation accentuates the lung markings. However, the changes present are worrisome for CHF and interstitial edema. No discrete pneumonia is observed. Electronically Signed   By: David  Martinique M.D.   On: 03/15/2017 07:28   Dg Chest Portable 1 View  Result Date: 03/13/2017 CLINICAL DATA:  Shortness of Breath EXAM: PORTABLE CHEST 1 VIEW COMPARISON:  11/09/2016 FINDINGS: Cardiac shadow is stable. The lungs are hypoaerated bilaterally. Elevation of the right hemidiaphragm is again seen. No focal infiltrate is noted. No acute bony abnormality is seen. IMPRESSION: Hypoaeration.  No acute abnormality noted. Electronically Signed   By: Inez Catalina M.D.   On: 03/13/2017 19:49   Dg Swallowing Func-speech Pathology  Result Date: 03/15/2017 Objective Swallowing Evaluation: Type of Study: MBS-Modified Barium Swallow Study  Patient Details Name: ROARKE MARCIANO MRN: [id number] Date of Birth: March 23, 1970 Today's Date: 03/15/2017 Time: SLP Start Time (ACUTE ONLY): 0959 -SLP Stop Time (ACUTE ONLY): 1020 SLP Time Calculation (min) (ACUTE ONLY): 21 min Past Medical History: Past Medical History: Diagnosis Date . Anxiety  . Cerebral palsy ([place])  . Chronic diastolic (congestive) heart failure ([place])  . CP (cerebral palsy) ([place])  . Diabetes mellitus without complication (HCC)   borderline . Environmental allergies   takes inhalers if needed . Esophageal stricture  . GERD (gastroesophageal reflux disease)  . Hypertension  . Motility disorder, esophageal  . Pneumonia  . 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 S/P Botox injection   approx every 4 months . Seasonal allergies  Past Surgical History: Past Surgical History: Procedure Laterality Date . BOTOX INJECTION N/A 12/21/2012  Procedure: BOTOX INJECTION;  Surgeon: Inda Castle, MD;  Location: [facility];  Service: Endoscopy;  Laterality: N/A; . BOTOX  INJECTION N/A 06/28/2014  Procedure: BOTOX INJECTION;  Surgeon: Inda Castle, MD;  Location: [facility];  Service: Endoscopy;  Laterality: N/A; . ESOPHAGOGASTRODUODENOSCOPY N/A 12/21/2012  Procedure: ESOPHAGOGASTRODUODENOSCOPY (EGD);  Surgeon: Inda Castle, MD;  Location: [facility];  Service: Endoscopy;  Laterality: N/A; . ESOPHAGOGASTRODUODENOSCOPY N/A 06/28/2014  Procedure: ESOPHAGOGASTRODUODENOSCOPY (EGD);  Surgeon: Inda Castle, MD;  Location: [facility];  Service: Endoscopy;  Laterality: N/A; . ESOPHAGUS SURGERY    stretched esophagus . legs   . MOUTH SURGERY   . TENDON RELEASE   HPI: Santino Kinsella Hulinis a 46 y.o.malewith medical history significant ofcerebral palsy, quadriplegia, CHF, hypertension, hyperlipidemia, diabetes mellitus, asthma, GERD, esophageal stricutre, esophageal motility disorder, pna, anxiety, who presents with cough and shortness of breath. Found to have asthma exacerbation and sepsis from unclear etiology. Chest x-ray showed hypoaeration without infiltration. MBS 05/26/14,  flash penetration, vallecular residue and rec'd to swallow twice and clear throat. BSE 10/07/15 recommended Dys 3, thin.  Subjective: " I really hope I can eat something" Assessment / Plan / Recommendation CHL IP CLINICAL IMPRESSIONS 03/15/2017 Clinical Impression Pt exhibited minimal pharyngeal dysphagia marked by consistent penetration with thin, unsensed. View of pyriform sinuses and larynx obstructed somewhat by shoulder therefore unable to determine if complete clearance achieved with cued cough/throat clear. No significant pharyngeal residue and timely swallow initiation. Esophageal scan did not reveal overt abnormalities although MBS does not diagnose esophageal impairments. Recommend nectar thick for a definitive amount of time primarily due to silent nature of penetration until strength and overall health improves, possible home health ST. Dys 3 texture recommended.     SLP Visit Diagnosis Dysphagia,  pharyngeal phase (R13.13) Attention and concentration deficit following -- Frontal lobe and executive function deficit following -- Impact on safety and function Mild aspiration risk;Moderate aspiration risk   CHL IP TREATMENT RECOMMENDATION 03/15/2017 Treatment Recommendations Therapy as outlined in treatment plan below   Prognosis 03/15/2017 Prognosis for Safe Diet Advancement Good Barriers to Reach Goals (No Data) Barriers/Prognosis Comment -- CHL IP DIET RECOMMENDATION 03/15/2017 SLP Diet Recommendations Dysphagia 3 (Mech soft) solids;Nectar thick liquid Liquid Administration via Straw;Cup Medication Administration Whole meds with puree Compensations Slow rate;Small sips/bites;Clear throat intermittently Postural Changes Seated upright at 90 degrees;Remain semi-upright after after feeds/meals (Comment)   CHL IP OTHER RECOMMENDATIONS 03/15/2017 Recommended Consults -- Oral Care Recommendations Oral care BID Other Recommendations Order thickener from pharmacy   CHL IP FOLLOW UP RECOMMENDATIONS 03/15/2017 Follow up Recommendations Home health SLP   CHL IP FREQUENCY AND DURATION 03/15/2017 Speech Therapy Frequency (ACUTE ONLY) min 2x/week Treatment Duration 2 weeks      CHL IP ORAL PHASE 03/15/2017 Oral Phase WFL Oral - Pudding Teaspoon -- Oral - Pudding Cup -- Oral - Honey Teaspoon -- Oral - Honey Cup -- Oral - Nectar Teaspoon -- Oral - Nectar Cup -- Oral - Nectar Straw -- Oral - Thin Teaspoon -- Oral - Thin Cup -- Oral - Thin Straw -- Oral - Puree -- Oral - Mech Soft -- Oral - Regular -- Oral - Multi-Consistency -- Oral - Pill -- Oral Phase - Comment --  CHL IP PHARYNGEAL PHASE 03/15/2017 Pharyngeal Phase Impaired Pharyngeal- Pudding Teaspoon -- Pharyngeal -- Pharyngeal- Pudding Cup -- Pharyngeal -- Pharyngeal- Honey Teaspoon -- Pharyngeal -- Pharyngeal- Honey Cup -- Pharyngeal -- Pharyngeal- Nectar Teaspoon -- Pharyngeal -- Pharyngeal- Nectar Cup WFL Pharyngeal -- Pharyngeal- Nectar Straw -- Pharyngeal --  Pharyngeal- Thin Teaspoon -- Pharyngeal -- Pharyngeal- Thin Cup Penetration/Aspiration during swallow;Reduced airway/laryngeal closure Pharyngeal Material enters airway, remains ABOVE vocal cords and not ejected out Pharyngeal- Thin Straw Penetration/Aspiration during swallow;Reduced airway/laryngeal closure Pharyngeal -- Pharyngeal- Puree -- Pharyngeal -- Pharyngeal- Mechanical Soft -- Pharyngeal -- Pharyngeal- Regular WFL Pharyngeal -- Pharyngeal- Multi-consistency -- Pharyngeal -- Pharyngeal- Pill -- Pharyngeal -- Pharyngeal Comment --  CHL IP CERVICAL ESOPHAGEAL PHASE 03/15/2017 Cervical Esophageal Phase WFL Pudding Teaspoon -- Pudding Cup -- Honey Teaspoon -- Honey Cup -- Nectar Teaspoon -- Nectar Cup -- Nectar Straw -- Thin Teaspoon -- Thin Cup -- Thin Straw -- Puree -- Mechanical Soft -- Regular -- Multi-consistency -- Pill -- Cervical Esophageal Comment -- CHL IP GO 10/07/2015 Functional Assessment Tool Used clinical judgment Functional Limitations Swallowing Swallow Current Status (U2025) CI Swallow Goal Status (K2706) CI Swallow Discharge Status (C3762) (None) Motor Speech Current Status (G3151) (None) Motor Speech Goal Status (V6160) (None) Motor Speech Goal  Status [phone]) (None) Spoken Language Comprehension Current Status [phone]) (None) Spoken Language Comprehension Goal Status [phone]) (None) Spoken Language Comprehension Discharge Status [phone]) (None) Spoken Language Expression Current Status [phone]) (None) Spoken Language Expression Goal Status [phone]) (None) Spoken Language Expression Discharge Status [phone]) (None) Attention Current Status (D3220) (None) Attention Goal Status (U5427) (None) Attention Discharge Status [phone]) (None) Memory Current Status (S2831) (None) Memory Goal Status (D1761) (None) Memory Discharge Status (Y0737) (None) Voice Current Status (T0626) (None) Voice Goal Status (R4854) (None) Voice Discharge Status (O2703) (None) Other Speech-Language Pathology Functional Limitation  Current Status (J0093) (None) Other Speech-Language Pathology Functional Limitation Goal Status (G1829) (None) Other Speech-Language Pathology Functional Limitation Discharge Status [phone]) (None) [name] 03/15/2017, 12:46 PM [name].Ed CCC-SLP Pager [phone]                   Scheduled Meds: . enoxaparin (LOVENOX) injection  40 mg Subcutaneous Q24H  . furosemide  40 mg Intravenous Daily  . guaiFENesin  1,200 mg Oral BID  . insulin aspart  0-5 Units Subcutaneous QHS  . insulin aspart  0-9 Units Subcutaneous TID WC  . ipratropium-albuterol  3 mL Nebulization Q6H  . latanoprost  1 drop Both Eyes QHS  . levofloxacin  750 mg Oral Q24H  . methylPREDNISolone (SOLU-MEDROL) injection  60 mg Intravenous Q8H  . montelukast  10 mg Oral q morning - 10a  . pantoprazole  40 mg Oral Daily  . rosuvastatin  5 mg Oral QHS  . timolol  1 drop Both Eyes Q12H   Continuous Infusions:   LOS: 1 day     Vernell Leep, MD, FACP, [facility]. [facility] Pager [phone]  If 7PM-7AM, please contact night-coverage [email or web address] Password [email or web address] 03/15/2017, 5:50 PM

## 2017-03-16 LAB — BASIC METABOLIC PANEL
Anion gap: 9 (ref 5–15)
BUN: 22 mg/dL — AB (ref 6–20)
CHLORIDE: 109 mmol/L (ref 101–111)
CO2: 20 mmol/L — AB (ref 22–32)
Calcium: 8.4 mg/dL — ABNORMAL LOW (ref 8.9–10.3)
Creatinine, Ser: 0.7 mg/dL (ref 0.61–1.24)
GFR calc non Af Amer: >60 mL/min (ref >60)
Glucose, Bld: 163 mg/dL — ABNORMAL HIGH (ref 65–99)
Potassium: 3.5 mmol/L (ref 3.5–5.1)
SODIUM: 138 mmol/L (ref 135–145)

## 2017-03-16 LAB — CBC
HCT: 30.5 % — ABNORMAL LOW (ref 39.0–52.0)
Hemoglobin: 10 g/dL — ABNORMAL LOW (ref 13.0–17.0)
MCH: 27 pg (ref 26.0–34.0)
MCHC: 32.8 g/dL (ref 30.0–36.0)
MCV: 82.4 fL (ref 78.0–100.0)
Platelets: 190 10*3/uL (ref 150–400)
RBC: 3.7 MIL/uL — AB (ref 4.22–5.81)
RDW: 13.7 % (ref 11.5–15.5)
WBC: 5.4 10*3/uL (ref 4.0–10.5)

## 2017-03-16 LAB — PROCALCITONIN: Procalcitonin: 0.94 ng/mL

## 2017-03-16 LAB — GLUCOSE, CAPILLARY
GLUCOSE-CAPILLARY: 143 mg/dL — AB (ref 65–99)
GLUCOSE-CAPILLARY: 239 mg/dL — AB (ref 65–99)
GLUCOSE-CAPILLARY: 235 mg/dL — AB (ref 65–99)
Glucose-Capillary: 232 mg/dL — ABNORMAL HIGH (ref 65–99)

## 2017-03-16 LAB — BRAIN NATRIURETIC PEPTIDE: B Natriuretic Peptide: 275.7 pg/mL — ABNORMAL HIGH (ref 0.0–100.0)

## 2017-03-16 MED ORDER — METHYLPREDNISOLONE SODIUM SUCC 40 MG IJ SOLR
40.0000 mg | Freq: Two times a day (BID) | INTRAMUSCULAR | Status: DC
  Administered 2017-03-16 – 2017-03-17 (×2): 40 mg via INTRAVENOUS
  Filled 2017-03-16 (×2): qty 1

## 2017-03-16 NOTE — Progress Notes (Signed)
  Speech Language Pathology Treatment: Dysphagia  Patient Details Name: Thomas Ramos MRN: [id number] DOB: 04/05/1970 Today's Date: 03/16/2017 Time: 4503-8882 SLP Time Calculation (min) (ACUTE ONLY): 16 min  Assessment / Plan / Recommendation Clinical Impression  Follow up after MBS yesterday. Pt stated "don't like it too much" in reference to the thick liquids. Reviewed MBS and educated pt he was borderline needing thick liquids however with his deconditioning and decreased reserve while in the hospital. Recommend the water protocol, thickening all other liquids to nectar and for approximately week post discharge then trials of all liquids thin and upgrading when he feels stronger/ready. Recommend home health ST as well.    HPI HPI: Thomas Ramos Hulinis a 47 y.o.malewith medical history significant ofcerebral palsy, quadriplegia, CHF, hypertension, hyperlipidemia, diabetes mellitus, asthma, GERD, esophageal stricutre, esophageal motility disorder, pna, anxiety, who presents with cough and shortness of breath. Found to have asthma exacerbation and sepsis from unclear etiology. Chest x-ray showed hypoaeration without infiltration. MBS 05/26/14, flash penetration, vallecular residue and rec'd to swallow twice and clear throat. BSE 10/07/15 recommended Dys 3, thin.       SLP Plan  Continue with current plan of care       Recommendations  Diet recommendations: Dysphagia 3 (mechanical soft);Nectar-thick liquid(on water protocol) Liquids provided via: Cup;Straw Medication Administration: Whole meds with puree Supervision: Patient able to self feed;Full supervision/cueing for compensatory strategies Compensations: Slow rate;Small sips/bites Postural Changes and/or Swallow Maneuvers: Seated upright 90 degrees                Oral Care Recommendations: Oral care BID Follow up Recommendations: Home health SLP SLP Visit Diagnosis: Dysphagia, pharyngeal phase (R13.13) Plan: Continue with current  plan of care                       Thomas Ramos 03/16/2017, 10:56 AM Thomas Ramos.Ed [employer] [phone]

## 2017-03-16 NOTE — Progress Notes (Signed)
PROGRESS NOTE   Thomas Ramos  IDP:[id number]    DOB: [birth year]    DOA: 03/13/2017  PCP: Chesley Noon, MD   I have briefly reviewed patients previous medical records in [facility].  Brief Narrative:  47 year old male with PMH of cerebral palsy, quadriplegia, moves around at home with the help of a wheelchair, chronic diastolic CHF, HTN, HLD, DM, asthma, GERD, anxiety who presented with productive cough, fever, chills, runny nose, sore throat and dyspnea. He was hypotensive in the 80s which improved after normal saline bolus. Admitted for acute respiratory failure with hypoxia due to asthma exacerbation and sepsis. Improving.   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 Acute viral bronchitis with asthma exacerbation: Admission chest x-ray without acute findings. May have superadded bacterial infection. Blood cultures 2: Negative to date. RSV panel negative. Urine culture insignificant growth. Urine pneumococcal antigen negative. Pro-calcitonin elevated at 1.53 >0.94. Empirically placed on IV levofloxacin, IV Solu-Medrol and bronchodilator nebulizations. Current respiratory symptoms and findings more suggestive of acute diastolic CHF, treat as below. Improving. Reduce Solu-Medrol to 40 mg every 12 hours. 2. Acute on chronic diastolic CHF: Likely precipitated by IV fluid hydration and holding Lasix on admission due to sepsis. TTE 07/23/15: LVEF 55-60 percent and grade 2 diastolic dysfunction. Follow 2-D echo-report pending. Chest x-ray 12/31 personally reviewed and suggestive of CHF. IV Lasix 40 mg daily, strict intake output and daily weights. Monitor closely. -2.7 L. Improving. Continue current treatment. 3. Acute  respiratory failure with hypoxia: Secondary to problems 1 and 2. Treat as above and wean off oxygen as tolerated for saturations greater than 92%. Improving. 4. Sepsis: Met sepsis criteria on admission including leukocytosis, tachycardia, tachypnea and fever with lactate of 2.67. Treated per sepsis criteria with IV fluids and antibiotics. Sepsis physiology resolved. 5. Elevated lactate: Secondary to above. Resolved. 6. Dysphagia: Speech therapy consultation appreciated and have initiated dysphagia 3 diet and nectar thickened liquids. 7. GERD: Continue pantoprazole. 8. Essential hypertension: Controlled. 9. Cerebral palsy with quadriplegia: Mental status at baseline. Reports that he moves around at home with the help of a wheelchair. 10. Hyperlipidemia: Continue statins. 11. Type II DM: Mildly uncontrolled and fluctuating. Continue SSI. 12. Anemia:? Dilutional. No bleeding reported. Stable.   DVT prophylaxis: Lovenox Code Status: Full Family Communication: Unable to reach contact the patient provided to update care. Left VM message. Disposition: DC home when medically improved, possibly in the next 48-72 hours.   Consultants:  None   Procedures:  None  Antimicrobials:  IV levofloxacin    Subjective: Feels better. Dyspnea improved. Still having some cough but seems nonproductive.  ROS: As above.  Objective:  Vitals:   03/16/17 0909 03/16/17 1224 03/16/17 1340 03/16/17 1440  BP:  110/61    Pulse: (!) 107 77  (!) 107  Resp: (!) 29 (!) 22  20  Temp:  98.3 F (36.8 C)    TempSrc:  Oral    SpO2: 95% 96% 97% 95%  Weight:      Height:        Examination:  General exam: Pleasant young male, moderately built and nourished, lying comfortably propped up in bed. Respiratory system: Much improved breath sounds compared to yesterday. Mostly clear anteriorly. Few scattered  rhonchi posteriorly and a few basal crackles. No increased work of breathing Cardiovascular system: S1 & S2  heard, RRR. No JVD, murmurs, rubs, gallops or clicks. Trace pitting bilateral leg edema. Telemetry personally reviewed: Mostly sinus rhythm. Occasional sinus tachycardia in the low 100s. Gastrointestinal system: Abdomen is nondistended, soft and nontender. No organomegaly or masses felt. Normal bowel sounds heard. Stable without change. Central nervous system: Alert and oriented 2. No focal neurological deficits. Stable without change. Extremities: Grade 0 x 5 power in lower extremities. At least grade 3 x 5 power in upper extremities. Skin: No rashes, lesions or ulcers Psychiatry: Judgement and insight impaired. Mood & affect pleasant & appropriate.     Data Reviewed: I have personally reviewed following labs and imaging studies  CBC: Recent Labs  Lab 03/13/17 1834 03/15/17 0308 03/16/17 0223  WBC 16.6* 6.4 5.4  NEUTROABS 14.0* 5.4  --   HGB 12.4* 9.7* 10.0*  HCT 37.7* 30.5* 30.5*  MCV 82.0 83.1 82.4  PLT 256 159 366   Basic Metabolic Panel: Recent Labs  Lab 03/13/17 1834 03/15/17 0308 03/16/17 0223  NA 139 138 138  K 3.9 3.8 3.5  CL 104 110 109  CO2 22 20* 20*  GLUCOSE 162* 155* 163*  BUN 20 18 22*  CREATININE 0.82 0.60* 0.70  CALCIUM 9.0 8.6* 8.4*  MG  --  2.2  --   PHOS  --  2.8  --    Liver Function Tests: Recent Labs  Lab 03/13/17 1834 03/15/17 0308  AST 20 21  ALT 18 15*  ALKPHOS 127* 81  BILITOT 0.4 0.6  PROT 7.2 5.9*  ALBUMIN 4.0 2.9*   Coagulation Profile: Recent Labs  Lab 03/13/17 1834  INR 1.06   HbA1C: Recent Labs    03/15/17 0308  HGBA1C 6.8*   CBG: Recent Labs  Lab 03/15/17 1130 03/15/17 1644 03/15/17 2057 03/16/17 0626 03/16/17 1152  GLUCAP 188* 195* 225* 143* 239*    Recent Results (from the past 240 hour(s))  Culture, blood (Routine x 2)     Status: None (Preliminary result)   Collection Time: 03/13/17  6:35 PM  Result Value Ref Range Status   Specimen Description BLOOD BLOOD RIGHT HAND  Final   Special Requests    Final    BOTTLES DRAWN AEROBIC AND ANAEROBIC Blood Culture adequate volume   Culture NO GROWTH 3 DAYS  Final   Report Status PENDING  Incomplete  Culture, blood (Routine x 2)     Status: None (Preliminary result)   Collection Time: 03/13/17  6:40 PM  Result Value Ref Range Status   Specimen Description BLOOD BLOOD RIGHT WRIST  Final   Special Requests   Final    BOTTLES DRAWN AEROBIC AND ANAEROBIC Blood Culture adequate volume   Culture NO GROWTH 3 DAYS  Final   Report Status PENDING  Incomplete  Respiratory Panel by PCR     Status: None   Collection Time: 03/13/17  8:42 PM  Result Value Ref Range Status   Adenovirus NOT DETECTED NOT DETECTED Final   Coronavirus 229E NOT DETECTED NOT DETECTED Final   Coronavirus HKU1 NOT DETECTED NOT DETECTED Final   Coronavirus NL63 NOT DETECTED NOT DETECTED Final   Coronavirus OC43 NOT DETECTED NOT DETECTED Final   Metapneumovirus NOT DETECTED NOT DETECTED Final   Rhinovirus / Enterovirus NOT DETECTED NOT DETECTED Final   Influenza A NOT DETECTED NOT DETECTED Final   Influenza B NOT DETECTED NOT DETECTED Final   Parainfluenza Virus  1 NOT DETECTED NOT DETECTED Final   Parainfluenza Virus 2 NOT DETECTED NOT DETECTED Final   Parainfluenza Virus 3 NOT DETECTED NOT DETECTED Final   Parainfluenza Virus 4 NOT DETECTED NOT DETECTED Final   Respiratory Syncytial Virus NOT DETECTED NOT DETECTED Final   Bordetella pertussis NOT DETECTED NOT DETECTED Final   Chlamydophila pneumoniae NOT DETECTED NOT DETECTED Final   Mycoplasma pneumoniae NOT DETECTED NOT DETECTED Final  Urine Culture     Status: Abnormal   Collection Time: 03/13/17 10:57 PM  Result Value Ref Range Status   Specimen Description URINE, CLEAN CATCH  Final   Special Requests NONE  Final   Culture <10,000 COLONIES/mL INSIGNIFICANT GROWTH (A)  Final   Report Status 03/15/2017 FINAL  Final         Radiology Studies: Dg Chest Port 1 View  Result Date: 03/15/2017 CLINICAL DATA:   Respiratory failure, asthma, CHF, sepsis, quadriplegia EXAM: PORTABLE CHEST 1 VIEW COMPARISON:  Portable chest x-ray of March 13, 2017 FINDINGS: The lungs remain hypoinflated. The interstitial markings are increased bilaterally. There is no large pleural effusion and no pneumothorax. There is no discrete infiltrate. The cardiac silhouette is enlarged. The pulmonary vascularity is indistinct. IMPRESSION: Bilateral hypoinflation accentuates the lung markings. However, the changes present are worrisome for CHF and interstitial edema. No discrete pneumonia is observed. Electronically Signed   By: David  Martinique M.D.   On: 03/15/2017 07:28   Dg Swallowing Func-speech Pathology  Result Date: 03/15/2017 Objective Swallowing Evaluation: Type of Study: MBS-Modified Barium Swallow Study  Patient Details Name: Thomas Ramos MRN: [id number] Date of Birth: 1970-05-10 Today's Date: 03/15/2017 Time: SLP Start Time (ACUTE ONLY): 0959 -SLP Stop Time (ACUTE ONLY): 1020 SLP Time Calculation (min) (ACUTE ONLY): 21 min Past Medical History: Past Medical History: Diagnosis Date . Anxiety  . Cerebral palsy ([place])  . Chronic diastolic (congestive) heart failure ([place])  . CP (cerebral palsy) ([place])  . Diabetes mellitus without complication (HCC)   borderline . Environmental allergies   takes inhalers if needed . Esophageal stricture  . GERD (gastroesophageal reflux disease)  . Hypertension  . Motility disorder, esophageal  . Pneumonia  . 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 S/P Botox injection   approx every 4 months . Seasonal allergies  Past Surgical History: Past Surgical History: Procedure Laterality Date . BOTOX INJECTION N/A 12/21/2012  Procedure: BOTOX INJECTION;  Surgeon: Inda Castle, MD;  Location: [facility];  Service: Endoscopy;  Laterality: N/A; . BOTOX INJECTION N/A 06/28/2014  Procedure: BOTOX INJECTION;  Surgeon: Inda Castle, MD;  Location: [place];  Service: Endoscopy;  Laterality: N/A; .  ESOPHAGOGASTRODUODENOSCOPY N/A 12/21/2012  Procedure: ESOPHAGOGASTRODUODENOSCOPY (EGD);  Surgeon: Inda Castle, MD;  Location: [facility];  Service: Endoscopy;  Laterality: N/A; . ESOPHAGOGASTRODUODENOSCOPY N/A 06/28/2014  Procedure: ESOPHAGOGASTRODUODENOSCOPY (EGD);  Surgeon: Inda Castle, MD;  Location: [place];  Service: Endoscopy;  Laterality: N/A; . ESOPHAGUS SURGERY    stretched esophagus . legs   . MOUTH SURGERY   . TENDON RELEASE   HPI: Jadarius Commons Hulinis a 47 y.o.malewith medical history significant ofcerebral palsy, quadriplegia, CHF, hypertension, hyperlipidemia, diabetes mellitus, asthma, GERD, esophageal stricutre, esophageal motility disorder, pna, anxiety, who presents with cough and shortness of breath. Found to have asthma exacerbation and sepsis from unclear etiology. Chest x-ray showed hypoaeration without infiltration. MBS 05/26/14, flash penetration, vallecular residue and rec'd to swallow twice and clear throat. BSE 10/07/15 recommended Dys 3, thin.  Subjective: " I really hope I can eat  something" Assessment / Plan / Recommendation CHL IP CLINICAL IMPRESSIONS 03/15/2017 Clinical Impression Pt exhibited minimal pharyngeal dysphagia marked by consistent penetration with thin, unsensed. View of pyriform sinuses and larynx obstructed somewhat by shoulder therefore unable to determine if complete clearance achieved with cued cough/throat clear. No significant pharyngeal residue and timely swallow initiation. Esophageal scan did not reveal overt abnormalities although MBS does not diagnose esophageal impairments. Recommend nectar thick for a definitive amount of time primarily due to silent nature of penetration until strength and overall health improves, possible home health ST. Dys 3 texture recommended.     SLP Visit Diagnosis Dysphagia, pharyngeal phase (R13.13) Attention and concentration deficit following -- Frontal lobe and executive function deficit following -- Impact on safety  and function Mild aspiration risk;Moderate aspiration risk   CHL IP TREATMENT RECOMMENDATION 03/15/2017 Treatment Recommendations Therapy as outlined in treatment plan below   Prognosis 03/15/2017 Prognosis for Safe Diet Advancement Good Barriers to Reach Goals (No Data) Barriers/Prognosis Comment -- CHL IP DIET RECOMMENDATION 03/15/2017 SLP Diet Recommendations Dysphagia 3 (Mech soft) solids;Nectar thick liquid Liquid Administration via Straw;Cup Medication Administration Whole meds with puree Compensations Slow rate;Small sips/bites;Clear throat intermittently Postural Changes Seated upright at 90 degrees;Remain semi-upright after after feeds/meals (Comment)   CHL IP OTHER RECOMMENDATIONS 03/15/2017 Recommended Consults -- Oral Care Recommendations Oral care BID Other Recommendations Order thickener from pharmacy   CHL IP FOLLOW UP RECOMMENDATIONS 03/15/2017 Follow up Recommendations Home health SLP   CHL IP FREQUENCY AND DURATION 03/15/2017 Speech Therapy Frequency (ACUTE ONLY) min 2x/week Treatment Duration 2 weeks      CHL IP ORAL PHASE 03/15/2017 Oral Phase WFL Oral - Pudding Teaspoon -- Oral - Pudding Cup -- Oral - Honey Teaspoon -- Oral - Honey Cup -- Oral - Nectar Teaspoon -- Oral - Nectar Cup -- Oral - Nectar Straw -- Oral - Thin Teaspoon -- Oral - Thin Cup -- Oral - Thin Straw -- Oral - Puree -- Oral - Mech Soft -- Oral - Regular -- Oral - Multi-Consistency -- Oral - Pill -- Oral Phase - Comment --  CHL IP PHARYNGEAL PHASE 03/15/2017 Pharyngeal Phase Impaired Pharyngeal- Pudding Teaspoon -- Pharyngeal -- Pharyngeal- Pudding Cup -- Pharyngeal -- Pharyngeal- Honey Teaspoon -- Pharyngeal -- Pharyngeal- Honey Cup -- Pharyngeal -- Pharyngeal- Nectar Teaspoon -- Pharyngeal -- Pharyngeal- Nectar Cup WFL Pharyngeal -- Pharyngeal- Nectar Straw -- Pharyngeal -- Pharyngeal- Thin Teaspoon -- Pharyngeal -- Pharyngeal- Thin Cup Penetration/Aspiration during swallow;Reduced airway/laryngeal closure Pharyngeal Material  enters airway, remains ABOVE vocal cords and not ejected out Pharyngeal- Thin Straw Penetration/Aspiration during swallow;Reduced airway/laryngeal closure Pharyngeal -- Pharyngeal- Puree -- Pharyngeal -- Pharyngeal- Mechanical Soft -- Pharyngeal -- Pharyngeal- Regular WFL Pharyngeal -- Pharyngeal- Multi-consistency -- Pharyngeal -- Pharyngeal- Pill -- Pharyngeal -- Pharyngeal Comment --  CHL IP CERVICAL ESOPHAGEAL PHASE 03/15/2017 Cervical Esophageal Phase WFL Pudding Teaspoon -- Pudding Cup -- Honey Teaspoon -- Honey Cup -- Nectar Teaspoon -- Nectar Cup -- Nectar Straw -- Thin Teaspoon -- Thin Cup -- Thin Straw -- Puree -- Mechanical Soft -- Regular -- Multi-consistency -- Pill -- Cervical Esophageal Comment -- CHL IP GO 10/07/2015 Functional Assessment Tool Used clinical judgment Functional Limitations Swallowing Swallow Current Status (F7902) CI Swallow Goal Status (I0973) CI Swallow Discharge Status (Z3299) (None) Motor Speech Current Status (M4268) (None) Motor Speech Goal Status (T4196) (None) Motor Speech Goal Status (Q2297) (None) Spoken Language Comprehension Current Status (L8921) (None) Spoken Language Comprehension Goal Status (J9417) (None) Spoken Language Comprehension Discharge Status (E0814) (None) Spoken Language Expression  Current Status [phone]) (None) Spoken Language Expression Goal Status [phone]) (None) Spoken Language Expression Discharge Status [phone]) (None) Attention Current Status [phone]) (None) Attention Goal Status (Q5848) (None) Attention Discharge Status [phone]) (None) Memory Current Status (P3225) (None) Memory Goal Status (O7209) (None) Memory Discharge Status (Z9802) (None) Voice Current Status (C1798) (None) Voice Goal Status (V0254) (None) Voice Discharge Status (C6282) (None) Other Speech-Language Pathology Functional Limitation Current Status (O1753) (None) Other Speech-Language Pathology Functional Limitation Goal Status (M1040) (None) Other Speech-Language Pathology Functional  Limitation Discharge Status [phone]) (None) [name] 03/15/2017, 12:46 PM [name].Ed CCC-SLP Pager [phone]                   Scheduled Meds: . enoxaparin (LOVENOX) injection  40 mg Subcutaneous Q24H  . furosemide  40 mg Intravenous Daily  . guaiFENesin  1,200 mg Oral BID  . insulin aspart  0-5 Units Subcutaneous QHS  . insulin aspart  0-9 Units Subcutaneous TID WC  . ipratropium-albuterol  3 mL Nebulization Q6H  . latanoprost  1 drop Both Eyes QHS  . levofloxacin  750 mg Oral Q24H  . methylPREDNISolone (SOLU-MEDROL) injection  40 mg Intravenous Q12H  . montelukast  10 mg Oral q morning - 10a  . pantoprazole  40 mg Oral Daily  . rosuvastatin  5 mg Oral QHS  . timolol  1 drop Both Eyes Q12H   Continuous Infusions:   LOS: 2 days     Vernell Leep, MD, FACP, [facility]. [facility] Pager [phone]  If 7PM-7AM, please contact night-coverage [email or web address] Password [email or web address] 03/16/2017, 3:25 PM

## 2017-03-17 ENCOUNTER — Inpatient Hospital Stay (HOSPITAL_COMMUNITY): Payer: Medicare Other

## 2017-03-17 DIAGNOSIS — E876 Hypokalemia: Secondary | ICD-10-CM

## 2017-03-17 LAB — GLUCOSE, CAPILLARY
GLUCOSE-CAPILLARY: 112 mg/dL — AB (ref 65–99)
GLUCOSE-CAPILLARY: 211 mg/dL — AB (ref 65–99)
GLUCOSE-CAPILLARY: 124 mg/dL — AB (ref 65–99)
GLUCOSE-CAPILLARY: 155 mg/dL — AB (ref 65–99)

## 2017-03-17 LAB — BASIC METABOLIC PANEL
ANION GAP: 6 (ref 5–15)
BUN: 23 mg/dL — ABNORMAL HIGH (ref 6–20)
CHLORIDE: 107 mmol/L (ref 101–111)
CO2: 26 mmol/L (ref 22–32)
Calcium: 8.2 mg/dL — ABNORMAL LOW (ref 8.9–10.3)
Creatinine, Ser: 0.71 mg/dL (ref 0.61–1.24)
Glucose, Bld: 121 mg/dL — ABNORMAL HIGH (ref 65–99)
POTASSIUM: 3.1 mmol/L — AB (ref 3.5–5.1)
SODIUM: 139 mmol/L (ref 135–145)

## 2017-03-17 LAB — MAGNESIUM: Magnesium: 2.2 mg/dL (ref 1.7–2.4)

## 2017-03-17 LAB — PROCALCITONIN: PROCALCITONIN: 0.41 ng/mL

## 2017-03-17 MED ORDER — POTASSIUM CHLORIDE CRYS ER 20 MEQ PO TBCR
40.0000 meq | EXTENDED_RELEASE_TABLET | Freq: Once | ORAL | Status: AC
  Administered 2017-03-17: 40 meq via ORAL
  Filled 2017-03-17: qty 2

## 2017-03-17 MED ORDER — PREDNISONE 20 MG PO TABS
30.0000 mg | ORAL_TABLET | Freq: Every day | ORAL | Status: DC
  Administered 2017-03-18: 30 mg via ORAL
  Filled 2017-03-17: qty 1

## 2017-03-17 NOTE — Progress Notes (Signed)
Inpatient Diabetes Program Recommendations  AACE/ADA: New Consensus Statement on Inpatient Glycemic Control (2015)  Target Ranges:  Prepandial:   less than 140 mg/dL      Peak postprandial:   less than 180 mg/dL (1-2 hours)      Critically ill patients:  140 - 180 mg/dL   Results for NYQUAN, SELBE (MRN [id number]) as of 03/17/2017 10:12  Ref. Range 03/16/2017 06:26 03/16/2017 11:52 03/16/2017 16:23 03/16/2017 20:29 03/17/2017 06:22  Glucose-Capillary Latest Ref Range: 65 - 99 mg/dL 143 (H) 239 (H) 232 (H) 235 (H) 112 (H)   Review of Glycemic Control  Diabetes history: DM2 Outpatient Diabetes medications: Metformin XR 500 mg QHS Current orders for Inpatient glycemic control: Novolog 0-9 units TID with meals, Novolog 0-5 units QHS; Solumedrol 40 mg Q12H  Inpatient Diabetes Program Recommendations:  Insulin - Meal Coverage:Post prandial glucose is consistently elevated.  If steroids will be continued, please consider ordering Novolog 3 units TID with meals for meal coverage if patient eats at least 50% of meals.  Thanks, Barnie Alderman, RN, MSN, CDE Diabetes Coordinator Inpatient Diabetes Program [phone] (Team Pager from 8am to 5pm)

## 2017-03-17 NOTE — Progress Notes (Signed)
PROGRESS NOTE   Thomas Ramos  MBP:[id number]    DOB: 1970-09-01    DOA: 03/13/2017  PCP: Chesley Noon, MD   I have briefly reviewed patients previous medical records in [facility].  Brief Narrative:  47 year old male with PMH of cerebral palsy, quadriplegia, moves around at home with the help of a wheelchair, chronic diastolic CHF, HTN, HLD, DM, asthma, GERD, anxiety who presented with productive cough, fever, chills, runny nose, sore throat and dyspnea. He was hypotensive in the 80s which improved after normal saline bolus. Admitted for acute respiratory failure with hypoxia due to asthma exacerbation and sepsis. Noted to have decompensated CHF, diuresing, improving.    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 Acute viral bronchitis with asthma exacerbation: Admission chest x-ray without acute findings. May have superadded bacterial infection. Blood cultures 2: Negative to date. RSV panel negative. Urine culture insignificant growth. Urine pneumococcal antigen negative. Pro-calcitonin elevated at 1.53 >0.94 >0.41. Empirically placed on IV levofloxacin, IV Solu-Medrol and bronchodilator nebulizations. Current respiratory symptoms and findings more suggestive of acute diastolic CHF, treat as below. Improving. Reduce Solu-Medrol to 40 mg every 12 hours. Has completed 4 days of IV levofloxacin, discontinued 1/2. DC IV steroids and transition to rapid oral prednisone taper beginning 1/3. 2. Acute on chronic diastolic CHF: Likely precipitated by IV fluid hydration and holding Lasix on admission due to sepsis. TTE 07/23/15: LVEF 55-60 percent and grade 2 diastolic dysfunction. 2-D echo report as discussed with cardiology: LVEF  65-70 percent. IV Lasix 40 mg daily, strict intake output and daily weights. Monitor closely. - 3.6 L. Chest x-ray 1/2 personally reviewed, still with CHF but better. Continue current IV Lasix. Improving. 3. Acute respiratory failure with hypoxia: Secondary to problems 1 and 2. Treat as above and wean off oxygen as tolerated for saturations greater than 92%. Improving. Currently on [place] oxygen 2 L and saturating in the low 90s. Discussed with RN and wean as long as oxygen saturations >92%. Consider outpatient OSA testing given occasional nighttime sinus bradycardia. 4. Sepsis: Met sepsis criteria on admission including leukocytosis, tachycardia, tachypnea and fever with lactate of 2.67. Treated per sepsis criteria with IV fluids and antibiotics. Sepsis physiology resolved. 5. Elevated lactate: Secondary to above. Resolved. 6. Dysphagia: Speech therapy consultation appreciated and have initiated dysphagia 3 diet and nectar thickened liquids. Stable. 7. GERD: Continue pantoprazole. 8. Essential hypertension: Controlled. 9. Cerebral palsy with quadriplegia: Mental status at baseline. Reports that he moves around at home with the help of a wheelchair. 10. Hyperlipidemia: Continue statins. 11. Type II DM: Mildly uncontrolled and fluctuating. Continue SSI. 12. Anemia:? Dilutional. No bleeding reported. Stable. 13. Hypokalemia: Replace and follow. Magnesium normal.   DVT prophylaxis: Lovenox Code Status: Full Family Communication: Unable to reach contact the patient provided to update care. Left VM message on 1/1. Disposition: DC home when medically improved, possibly in the next 24-48 hours.   Consultants:  None   Procedures:  None  Antimicrobials:  IV levofloxacin    Subjective: States that his breathing has improved but not yet back to baseline. No cough or chest pain reported. As per RN, no acute events.  ROS: As above.  Objective:  Vitals:   03/17/17 0327 03/17/17 0400  03/17/17 0700  03/17/17 0726  BP: 121/75     Pulse: 60 63 (!) 58   Resp: (!) 30 (!) 25 (!) 26   Temp: 97.8 F (36.6 C)     TempSrc: Oral     SpO2: 93% 95% 90% 93%  Weight: 77.3 kg (170 lb 6.7 oz)     Height:        Examination:  General exam: Pleasant young male, moderately built and nourished, lying comfortably propped up in bed. Does not appear in acute distress. Respiratory system: Diminished breath sounds in the bases with few basal crackles. Occasional posterior rhonchi. Rest of lung fields clear to auscultation Cardiovascular system: S1 & S2 heard, RRR. No JVD, murmurs, rubs, gallops or clicks. Trace pitting bilateral leg edema. Telemetry personally reviewed: Mostly sinus rhythm. Occasional sinus bradycardia in the high 40s overnight, asymptomatic? OSA. Gastrointestinal system: Abdomen is nondistended, soft and nontender. No organomegaly or masses felt. Normal bowel sounds heard. Stable without change. Central nervous system: Alert and oriented 2. No focal neurological deficits. Stable without change. Extremities: Grade 0 x 5 power in lower extremities. At least grade 3 x 5 power in upper extremities. Skin: No rashes, lesions or ulcers Psychiatry: Judgement and insight impaired. Mood & affect pleasant & appropriate.     Data Reviewed: I have personally reviewed following labs and imaging studies  CBC: Recent Labs  Lab 03/13/17 1834 03/15/17 0308 03/16/17 0223  WBC 16.6* 6.4 5.4  NEUTROABS 14.0* 5.4  --   HGB 12.4* 9.7* 10.0*  HCT 37.7* 30.5* 30.5*  MCV 82.0 83.1 82.4  PLT 256 159 562   Basic Metabolic Panel: Recent Labs  Lab 03/13/17 1834 03/15/17 0308 03/16/17 0223 03/17/17 0332  NA 139 138 138 139  K 3.9 3.8 3.5 3.1*  CL 104 110 109 107  CO2 22 20* 20* 26  GLUCOSE 162* 155* 163* 121*  BUN 20 18 22* 23*  CREATININE 0.82 0.60* 0.70 0.71  CALCIUM 9.0 8.6* 8.4* 8.2*  MG  --  2.2  --  2.2  PHOS  --  2.8  --   --    Liver Function Tests: Recent Labs  Lab 03/13/17 1834  03/15/17 0308  AST 20 21  ALT 18 15*  ALKPHOS 127* 81  BILITOT 0.4 0.6  PROT 7.2 5.9*  ALBUMIN 4.0 2.9*   Coagulation Profile: Recent Labs  Lab 03/13/17 1834  INR 1.06   HbA1C: Recent Labs    03/15/17 0308  HGBA1C 6.8*   CBG: Recent Labs  Lab 03/16/17 1152 03/16/17 1623 03/16/17 2029 03/17/17 0622 03/17/17 1142  GLUCAP 239* 232* 235* 112* 211*    Recent Results (from the past 240 hour(s))  Culture, blood (Routine x 2)     Status: None (Preliminary result)   Collection Time: 03/13/17  6:35 PM  Result Value Ref Range Status   Specimen Description BLOOD BLOOD RIGHT HAND  Final   Special Requests   Final    BOTTLES DRAWN AEROBIC AND ANAEROBIC Blood Culture adequate volume   Culture NO GROWTH 3 DAYS  Final   Report Status PENDING  Incomplete  Culture, blood (Routine x 2)     Status: None (Preliminary result)   Collection Time: 03/13/17  6:40 PM  Result Value Ref Range Status   Specimen Description BLOOD BLOOD RIGHT WRIST  Final   Special Requests   Final    BOTTLES DRAWN AEROBIC AND ANAEROBIC Blood Culture adequate volume   Culture NO GROWTH 3 DAYS  Final   Report Status PENDING  Incomplete  Respiratory Panel by PCR     Status: None   Collection Time: 03/13/17  8:42 PM  Result Value Ref Range Status   Adenovirus NOT DETECTED NOT DETECTED Final   Coronavirus 229E NOT DETECTED NOT DETECTED Final   Coronavirus HKU1 NOT DETECTED NOT DETECTED Final   Coronavirus NL63 NOT DETECTED NOT DETECTED Final   Coronavirus OC43 NOT DETECTED NOT DETECTED Final   Metapneumovirus NOT DETECTED NOT DETECTED Final   Rhinovirus / Enterovirus NOT DETECTED NOT DETECTED Final   Influenza A NOT DETECTED NOT DETECTED Final   Influenza B NOT DETECTED NOT DETECTED Final   Parainfluenza Virus 1 NOT DETECTED NOT DETECTED Final   Parainfluenza Virus 2 NOT DETECTED NOT DETECTED Final   Parainfluenza Virus 3 NOT DETECTED NOT DETECTED Final   Parainfluenza Virus 4 NOT DETECTED NOT DETECTED  Final   Respiratory Syncytial Virus NOT DETECTED NOT DETECTED Final   Bordetella pertussis NOT DETECTED NOT DETECTED Final   Chlamydophila pneumoniae NOT DETECTED NOT DETECTED Final   Mycoplasma pneumoniae NOT DETECTED NOT DETECTED Final  Urine Culture     Status: Abnormal   Collection Time: 03/13/17 10:57 PM  Result Value Ref Range Status   Specimen Description URINE, CLEAN CATCH  Final   Special Requests NONE  Final   Culture <10,000 COLONIES/mL INSIGNIFICANT GROWTH (A)  Final   Report Status 03/15/2017 FINAL  Final         Radiology Studies: Dg Chest 2 View  Result Date: 03/17/2017 CLINICAL DATA:  Follow-up examination for CHF. EXAM: CHEST  2 VIEW COMPARISON:  Prior radiograph from 03/15/2017. FINDINGS: Patient is rotated to the right. Cardiomegaly is grossly stable. Mediastinal silhouette within normal limits. Lungs hypoinflated. Diffuse vascular and interstitial congestion consistent with mild pulmonary edema, perhaps mildly improved from previous. No appreciable pleural effusion. No consolidative opacity or definite focal infiltrates. No pneumothorax. Osseous structures are unchanged. IMPRESSION: Persistent pulmonary vascular and interstitial congestion, suggesting pulmonary edema/CHF. Changes are overall mildly improved relative to 03/15/17. Electronically Signed   By: Jeannine Boga M.D.   On: 03/17/2017 06:50        Scheduled Meds: . enoxaparin (LOVENOX) injection  40 mg Subcutaneous Q24H  . furosemide  40 mg Intravenous Daily  . guaiFENesin  1,200 mg Oral BID  . insulin aspart  0-5 Units Subcutaneous QHS  . insulin aspart  0-9 Units Subcutaneous TID WC  . ipratropium-albuterol  3 mL Nebulization Q6H  . latanoprost  1 drop Both Eyes QHS  . levofloxacin  750 mg Oral Q24H  . methylPREDNISolone (SOLU-MEDROL) injection  40 mg Intravenous Q12H  . montelukast  10 mg Oral q morning - 10a  . pantoprazole  40 mg Oral Daily  . rosuvastatin  5 mg Oral QHS  . timolol  1  drop Both Eyes Q12H   Continuous Infusions:   LOS: 3 days     Vernell Leep, MD, FACP, [facility]. [facility] Pager [phone]  If 7PM-7AM, please contact night-coverage [email or web address] Password [email or web address] 03/17/2017, 11:59 AM

## 2017-03-17 NOTE — Care Management Note (Signed)
Case Management Note  Patient Details  Name: Thomas Ramos MRN: [id number] Date of Birth: 04-Jul-1970  Subjective/Objective:   From home alone, he has motorized wheelchaire that he uses.  He is in the [facility] with services for supported living.  At discharge will need to contact [name] at [phone].  He will need ambulance transport at discharge. 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  He is indep with his motorized w/ chair at home.                 Action/Plan: NCM will follow for dc needs.   Expected Discharge Date:                  Expected Discharge Plan:  Home/Self Care  In-House Referral:     Discharge planning Services  CM Consult  Post Acute Care Choice:    Choice offered to:     DME Arranged:    DME Agency:     HH Arranged:    HH Agency:     Status of Service:  In process, will continue to follow  If discussed at Long Length of Stay Meetings, dates discussed:    Additional Comments:  Zenon Mayo, RN 03/17/2017, 10:22 AM

## 2017-03-18 DIAGNOSIS — J9601 Acute respiratory failure with hypoxia: Secondary | ICD-10-CM

## 2017-03-18 DIAGNOSIS — I10 Essential (primary) hypertension: Secondary | ICD-10-CM

## 2017-03-18 DIAGNOSIS — G8250 Quadriplegia, unspecified: Secondary | ICD-10-CM

## 2017-03-18 DIAGNOSIS — R509 Fever, unspecified: Secondary | ICD-10-CM

## 2017-03-18 DIAGNOSIS — I5032 Chronic diastolic (congestive) heart failure: Secondary | ICD-10-CM

## 2017-03-18 DIAGNOSIS — J45901 Unspecified asthma with (acute) exacerbation: Secondary | ICD-10-CM

## 2017-03-18 DIAGNOSIS — R0602 Shortness of breath: Secondary | ICD-10-CM

## 2017-03-18 LAB — BASIC METABOLIC PANEL
Anion gap: 5 (ref 5–15)
BUN: 19 mg/dL (ref 6–20)
CALCIUM: 8.1 mg/dL — AB (ref 8.9–10.3)
CO2: 26 mmol/L (ref 22–32)
CREATININE: 0.68 mg/dL (ref 0.61–1.24)
Chloride: 106 mmol/L (ref 101–111)
Glucose, Bld: 117 mg/dL — ABNORMAL HIGH (ref 65–99)
Potassium: 3.6 mmol/L (ref 3.5–5.1)
SODIUM: 137 mmol/L (ref 135–145)

## 2017-03-18 LAB — GLUCOSE, CAPILLARY
GLUCOSE-CAPILLARY: 137 mg/dL — AB (ref 65–99)
Glucose-Capillary: 159 mg/dL — ABNORMAL HIGH (ref 65–99)

## 2017-03-18 LAB — CULTURE, BLOOD (ROUTINE X 2)
CULTURE: NO GROWTH
Culture: NO GROWTH
SPECIAL REQUESTS: ADEQUATE
Special Requests: ADEQUATE

## 2017-03-18 MED ORDER — PREDNISONE 5 MG PO TABS: 5.0000 mg | ORAL_TABLET | Freq: Every day | ORAL | Status: DC

## 2017-03-18 MED ORDER — FUROSEMIDE 40 MG PO TABS: 40.0000 mg | ORAL_TABLET | Freq: Every day | ORAL | 0 refills | Status: DC

## 2017-03-18 MED ORDER — IPRATROPIUM-ALBUTEROL 0.5-2.5 (3) MG/3ML IN SOLN
3.0000 mL | Freq: Three times a day (TID) | RESPIRATORY_TRACT | Status: DC
  Administered 2017-03-18: 3 mL via RESPIRATORY_TRACT
  Filled 2017-03-18 (×2): qty 3

## 2017-03-18 NOTE — Care Management Note (Signed)
Case Management Note Original Note Created Zenon Mayo, RN 03/17/2017, 10:22 AM  Patient Details  Name: Thomas Ramos MRN: [id number] Date of Birth: 03-10-1971  Subjective/Objective:   From home alone, he has motorized wheelchaire that he uses.  He is in the [facility] [employer] with services for supported living.  At discharge will need to contact [name] at [phone].  He will need ambulance transport at discharge. 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  He is indep with his motorized w/ chair at home.                 Action/Plan: NCM will follow for dc needs.   Expected Discharge Date:  03/18/17               Expected Discharge Plan:  Home/Self Care  In-House Referral:  NA  Discharge planning Services  CM Consult, Other - See comment  Post Acute Care Choice:  NA Choice offered to:     DME Arranged:    DME Agency:     HH Arranged:    HH Agency:     Status of Service:  Completed, signed off  If discussed at [place] of Stay Meetings, dates discussed:    Discharge Disposition: home/self care   Additional Comments:  03/18/17- 1245- Lisanne Ponce RN, CM- pt for d/c home today- spoke with pt at bedside- confirmed that pt had someone to meet him at home when ambulance arrives- address and phone # confirmed- pt has PCP for f/u- no other transition needs noted per conversation with pt - [facility] transport arranged- paperwork placed on shadow chart for transport home- Bedside RN Lambert Keto- aware and will get pt ready for transport.   Dahlia Client [place], RN 03/18/2017, 12:52 PM [phone]

## 2017-03-18 NOTE — Discharge Summary (Signed)
Physician Discharge Summary  Thomas Ramos YTW:[id number] DOB: [birth year] DOA: 03/13/2017  PCP: Chesley Noon, MD  Admit date: 03/13/2017 Discharge date: 03/18/2017  Admitted From: Home Disposition:  Home  Recommendations for Outpatient Follow-up:  1. Follow up with PCP in 2-3 weeks 2. Please obtain BMP/CBC in one week  Discharge Condition:Improved CODE STATUS:Full Diet recommendation: Dysphagia 3 with nectar thick liquids   Brief/Interim Summary: 47 year old male with PMH of cerebral palsy, quadriplegia, moves around at home with the help of a wheelchair, chronic diastolic CHF, HTN, HLD, DM, asthma, GERD, anxiety who presented with productive cough, fever, chills, runny nose, sore throat and dyspnea. He was hypotensive in the 80s which improved after normal saline bolus. Admitted for acute respiratory failure with hypoxia due to asthma exacerbation and sepsis. Noted to have decompensated CHF, diuresing, improving.    1. Acute viral bronchitis with asthma exacerbation: Admission chest x-ray without acute findings. May have superadded bacterial infection. Blood cultures 2: Negative to date. RSV panel negative. Urine culture insignificant growth. Urine pneumococcal antigen negative. Pro-calcitonin elevated at 1.53 >0.94 >0.41. Empirically placed on IV levofloxacin, IV Solu-Medrol and bronchodilator nebulizations. Current respiratory symptoms and findings more suggestive of acute diastolic CHF, treat as below. Improving. Has completed 4 days of IV levofloxacin, discontinued 1/2. DC IV steroids and transition to rapid oral prednisone taper beginning 1/3. 2. Acute on chronic diastolic CHF: Likely precipitated by IV fluid hydration and holding Lasix on admission due to sepsis. TTE 07/23/15: LVEF 55-60 percent and grade 2 diastolic dysfunction. 2-D echo report as discussed with cardiology: LVEF 65-70 percent. IV Lasix 40 mg daily, strict intake output and daily weights. Monitor closely. - 3.6 L.  Chest x-ray still with CHF but better, pt on minimal O2 support. 3. Acute respiratory failure with hypoxia: Secondary to problems 1 and 2. Treat as above and wean off oxygen as tolerated for saturations greater than 92%. Improving. Currently on [place] oxygen 2 L and saturating in the low 90s. Discussed with RN and wean as long as oxygen saturations >92%. Consider outpatient OSA testing given occasional nighttime sinus bradycardia. 4. Sepsis: Met sepsis criteria on admission including leukocytosis, tachycardia, tachypnea and fever with lactate of 2.67. Treated per sepsis criteria with IV fluids and antibiotics. Sepsis physiology resolved. 5. Elevated lactate: Secondary to above. Resolved. 6. Dysphagia: Speech therapy consultation appreciated and have initiated dysphagia 3 diet and nectar thickened liquids. Stable. 7. GERD: Continue pantoprazole. 8. Essential hypertension: Controlled. 9. Cerebral palsy with quadriplegia: Mental status at baseline. Reports that he moves around at home with the help of a wheelchair. 10. Hyperlipidemia: Continue statins. 11. Type II DM: Mildly uncontrolled and fluctuating. Continue SSI. 12. Anemia:? Dilutional. No bleeding reported. Stable. 13. Hypokalemia: Replaced. Magnesium normal.    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[place])    Discharge Instructions   Allergies as of 03/18/2017      Reactions   Sulfamethoxazole-trimethoprim Nausea And Vomiting   Metronidazole Nausea And Vomiting   Penicillins Hives, Nausea And Vomiting, Other (See Comments)   Patient tolerated cefazolin in 2017 Has patient had a PCN reaction causing immediate rash, facial/tongue/throat swelling, SOB or lightheadedness with  hypotension: Yes Has patient had a PCN reaction causing severe rash involving mucus membranes or skin necrosis: No Has  patient had a PCN reaction that required hospitalization No Has patient had a PCN reaction occurring within the last 10 years: Yes If all of the above answers are "NO", then may proceed with Cephalosporin use.      Medication List    STOP taking these medications   pantoprazole 20 MG tablet Commonly known as:  PROTONIX     TAKE these medications   albuterol 108 (90 Base) MCG/ACT inhaler Commonly known as:  PROVENTIL HFA;VENTOLIN HFA Inhale 2 puffs into the lungs every 6 (six) hours as needed for wheezing or shortness of breath. Use inhaler 3 times daily x 5 days, then every 6 hours as needed. What changed:  additional instructions   amLODipine 10 MG tablet Commonly known as:  NORVASC Take 10 mg by mouth daily.   fluticasone 50 MCG/ACT nasal spray Commonly known as:  FLONASE Place 1 spray into both nostrils daily as needed for rhinitis.   furosemide 40 MG tablet Commonly known as:  LASIX Take 1 tablet (40 mg total) by mouth daily. What changed:    medication strength  how much to take  when to take this   latanoprost 0.005 % ophthalmic solution Commonly known as:  XALATAN Place 1 drop into both eyes at bedtime.   metFORMIN 500 MG 24 hr tablet Commonly known as:  GLUCOPHAGE-XR Take 500 mg by mouth at bedtime.   montelukast 10 MG tablet Commonly known as:  SINGULAIR Take 10 mg by mouth every morning.   nitroGLYCERIN 0.4 MG SL tablet Commonly known as:  NITROSTAT Place 0.4 mg under the tongue every 5 (five) minutes as needed for chest pain.   omeprazole 20 MG capsule Commonly known as:  PRILOSEC Take 20 mg by mouth daily.   ondansetron 4 MG tablet Commonly known as:  ZOFRAN Take 1 tablet (4 mg total) by mouth every 6 (six) hours.   potassium chloride 10 MEQ tablet Commonly known as:  K-DUR Take 10 mEq by mouth daily.   predniSONE 5 MG  tablet Commonly known as:  DELTASONE Take 1 tablet (5 mg total) by mouth daily with breakfast.   rosuvastatin 5 MG tablet Commonly known as:  CRESTOR Take 5 mg by mouth at bedtime.   timolol 0.5 % ophthalmic solution Commonly known as:  TIMOPTIC Place 1 drop into both eyes every 12 (twelve) hours.   valsartan 80 MG tablet Commonly known as:  DIOVAN Take 80 mg by mouth daily.      Follow-up Information    Chesley Noon, MD. Schedule an appointment as soon as possible for a visit in 2 week(s).   Specialty:  Family Medicine Contact information: [place] [address] [phone]          Allergies  Allergen Reactions  . Sulfamethoxazole-Trimethoprim Nausea And Vomiting  . Metronidazole Nausea And Vomiting  . Penicillins Hives, Nausea And Vomiting and Other (See Comments)    Patient tolerated cefazolin in 2017 Has patient had a PCN reaction causing immediate rash, facial/tongue/throat swelling, SOB or lightheadedness with hypotension: Yes Has patient had a PCN reaction causing severe rash involving mucus membranes or skin necrosis: No Has patient had a PCN reaction that required hospitalization No Has patient had a PCN reaction occurring within the last 10 years: Yes If all of the above answers are "NO", then may proceed with Cephalosporin use.    Procedures/Studies: Dg Chest 2 View  Result Date: 03/17/2017 CLINICAL DATA:  Follow-up examination for CHF. EXAM: CHEST  2  VIEW COMPARISON:  Prior radiograph from 03/15/2017. FINDINGS: Patient is rotated to the right. Cardiomegaly is grossly stable. Mediastinal silhouette within normal limits. Lungs hypoinflated. Diffuse vascular and interstitial congestion consistent with mild pulmonary edema, perhaps mildly improved from previous. No appreciable pleural effusion. No consolidative opacity or definite focal infiltrates. No pneumothorax. Osseous structures are unchanged. IMPRESSION: Persistent pulmonary  vascular and interstitial congestion, suggesting pulmonary edema/CHF. Changes are overall mildly improved relative to 03/15/17. Electronically Signed   By: Jeannine Boga M.D.   On: 03/17/2017 06:50   Dg Chest Port 1 View  Result Date: 03/15/2017 CLINICAL DATA:  Respiratory failure, asthma, CHF, sepsis, quadriplegia EXAM: PORTABLE CHEST 1 VIEW COMPARISON:  Portable chest x-ray of March 13, 2017 FINDINGS: The lungs remain hypoinflated. The interstitial markings are increased bilaterally. There is no large pleural effusion and no pneumothorax. There is no discrete infiltrate. The cardiac silhouette is enlarged. The pulmonary vascularity is indistinct. IMPRESSION: Bilateral hypoinflation accentuates the lung markings. However, the changes present are worrisome for CHF and interstitial edema. No discrete pneumonia is observed. Electronically Signed   By: David  Martinique M.D.   On: 03/15/2017 07:28   Dg Chest Portable 1 View  Result Date: 03/13/2017 CLINICAL DATA:  Shortness of Breath EXAM: PORTABLE CHEST 1 VIEW COMPARISON:  11/09/2016 FINDINGS: Cardiac shadow is stable. The lungs are hypoaerated bilaterally. Elevation of the right hemidiaphragm is again seen. No focal infiltrate is noted. No acute bony abnormality is seen. IMPRESSION: Hypoaeration.  No acute abnormality noted. Electronically Signed   By: Inez Catalina M.D.   On: 03/13/2017 19:49   Dg Swallowing Func-speech Pathology  Result Date: 03/15/2017 Objective Swallowing Evaluation: Type of Study: MBS-Modified Barium Swallow Study  Patient Details Name: Thomas Ramos MRN: [id number] Date of Birth: [birth year] Today's Date: 03/15/2017 Time: SLP Start Time (ACUTE ONLY): 0959 -SLP Stop Time (ACUTE ONLY): 1020 SLP Time Calculation (min) (ACUTE ONLY): 21 min Past Medical History: Past Medical History: Diagnosis Date . Anxiety  . Cerebral palsy ([place])  . Chronic diastolic (congestive) heart failure ([place])  . CP (cerebral palsy) ([place])  . Diabetes mellitus  without complication (HCC)   borderline . Environmental allergies   takes inhalers if needed . Esophageal stricture  . GERD (gastroesophageal reflux disease)  . Hypertension  . Motility disorder, esophageal  . Pneumonia  . 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 S/P Botox injection   approx every 4 months . Seasonal allergies  Past Surgical History: Past Surgical History: Procedure Laterality Date . BOTOX INJECTION N/A 12/21/2012  Procedure: BOTOX INJECTION;  Surgeon: Inda Castle, MD;  Location: [facility];  Service: Endoscopy;  Laterality: N/A; . BOTOX INJECTION N/A 06/28/2014  Procedure: BOTOX INJECTION;  Surgeon: Inda Castle, MD;  Location: [facility];  Service: Endoscopy;  Laterality: N/A; . ESOPHAGOGASTRODUODENOSCOPY N/A 12/21/2012  Procedure: ESOPHAGOGASTRODUODENOSCOPY (EGD);  Surgeon: Inda Castle, MD;  Location: [facility];  Service: Endoscopy;  Laterality: N/A; . ESOPHAGOGASTRODUODENOSCOPY N/A 06/28/2014  Procedure: ESOPHAGOGASTRODUODENOSCOPY (EGD);  Surgeon: Inda Castle, MD;  Location: [facility];  Service: Endoscopy;  Laterality: N/A; . ESOPHAGUS SURGERY    stretched esophagus . legs   . MOUTH SURGERY   . TENDON RELEASE   HPI: Thomas Brabec Hulinis a 47 y.o.malewith medical history significant ofcerebral palsy, quadriplegia, CHF, hypertension, hyperlipidemia, diabetes mellitus, asthma, GERD, esophageal stricutre, esophageal motility disorder, pna, anxiety, who presents with cough and shortness of breath. Found to have asthma exacerbation and sepsis from unclear etiology. Chest x-ray showed hypoaeration without infiltration. MBS 05/26/14,  flash penetration, vallecular residue and rec'd to swallow twice and clear throat. BSE 10/07/15 recommended Dys 3, thin.  Subjective: " I really hope I can eat something" Assessment / Plan / Recommendation CHL IP CLINICAL IMPRESSIONS 03/15/2017 Clinical Impression Pt exhibited minimal pharyngeal dysphagia marked by consistent penetration with thin, unsensed.  View of pyriform sinuses and larynx obstructed somewhat by shoulder therefore unable to determine if complete clearance achieved with cued cough/throat clear. No significant pharyngeal residue and timely swallow initiation. Esophageal scan did not reveal overt abnormalities although MBS does not diagnose esophageal impairments. Recommend nectar thick for a definitive amount of time primarily due to silent nature of penetration until strength and overall health improves, possible home health ST. Dys 3 texture recommended.     SLP Visit Diagnosis Dysphagia, pharyngeal phase (R13.13) Attention and concentration deficit following -- Frontal lobe and executive function deficit following -- Impact on safety and function Mild aspiration risk;Moderate aspiration risk   CHL IP TREATMENT RECOMMENDATION 03/15/2017 Treatment Recommendations Therapy as outlined in treatment plan below   Prognosis 03/15/2017 Prognosis for Safe Diet Advancement Good Barriers to Reach Goals (No Data) Barriers/Prognosis Comment -- CHL IP DIET RECOMMENDATION 03/15/2017 SLP Diet Recommendations Dysphagia 3 (Mech soft) solids;Nectar thick liquid Liquid Administration via Straw;Cup Medication Administration Whole meds with puree Compensations Slow rate;Small sips/bites;Clear throat intermittently Postural Changes Seated upright at 90 degrees;Remain semi-upright after after feeds/meals (Comment)   CHL IP OTHER RECOMMENDATIONS 03/15/2017 Recommended Consults -- Oral Care Recommendations Oral care BID Other Recommendations Order thickener from pharmacy   CHL IP FOLLOW UP RECOMMENDATIONS 03/15/2017 Follow up Recommendations Home health SLP   CHL IP FREQUENCY AND DURATION 03/15/2017 Speech Therapy Frequency (ACUTE ONLY) min 2x/week Treatment Duration 2 weeks      CHL IP ORAL PHASE 03/15/2017 Oral Phase WFL Oral - Pudding Teaspoon -- Oral - Pudding Cup -- Oral - Honey Teaspoon -- Oral - Honey Cup -- Oral - Nectar Teaspoon -- Oral - Nectar Cup -- Oral - Nectar  Straw -- Oral - Thin Teaspoon -- Oral - Thin Cup -- Oral - Thin Straw -- Oral - Puree -- Oral - Mech Soft -- Oral - Regular -- Oral - Multi-Consistency -- Oral - Pill -- Oral Phase - Comment --  CHL IP PHARYNGEAL PHASE 03/15/2017 Pharyngeal Phase Impaired Pharyngeal- Pudding Teaspoon -- Pharyngeal -- Pharyngeal- Pudding Cup -- Pharyngeal -- Pharyngeal- Honey Teaspoon -- Pharyngeal -- Pharyngeal- Honey Cup -- Pharyngeal -- Pharyngeal- Nectar Teaspoon -- Pharyngeal -- Pharyngeal- Nectar Cup WFL Pharyngeal -- Pharyngeal- Nectar Straw -- Pharyngeal -- Pharyngeal- Thin Teaspoon -- Pharyngeal -- Pharyngeal- Thin Cup Penetration/Aspiration during swallow;Reduced airway/laryngeal closure Pharyngeal Material enters airway, remains ABOVE vocal cords and not ejected out Pharyngeal- Thin Straw Penetration/Aspiration during swallow;Reduced airway/laryngeal closure Pharyngeal -- Pharyngeal- Puree -- Pharyngeal -- Pharyngeal- Mechanical Soft -- Pharyngeal -- Pharyngeal- Regular WFL Pharyngeal -- Pharyngeal- Multi-consistency -- Pharyngeal -- Pharyngeal- Pill -- Pharyngeal -- Pharyngeal Comment --  CHL IP CERVICAL ESOPHAGEAL PHASE 03/15/2017 Cervical Esophageal Phase WFL Pudding Teaspoon -- Pudding Cup -- Honey Teaspoon -- Honey Cup -- Nectar Teaspoon -- Nectar Cup -- Nectar Straw -- Thin Teaspoon -- Thin Cup -- Thin Straw -- Puree -- Mechanical Soft -- Regular -- Multi-consistency -- Pill -- Cervical Esophageal Comment -- CHL IP GO 10/07/2015 Functional Assessment Tool Used clinical judgment Functional Limitations Swallowing Swallow Current Status (V3710) CI Swallow Goal Status (G2694) CI Swallow Discharge Status (W5462) (None) Motor Speech Current Status (V0350) (None) Motor Speech Goal Status (K9381) (None) Motor Speech Goal  Status [phone]) (None) Spoken Language Comprehension Current Status [phone]) (None) Spoken Language Comprehension Goal Status [phone]) (None) Spoken Language Comprehension Discharge Status [phone]) (None) Spoken  Language Expression Current Status [phone]) (None) Spoken Language Expression Goal Status [phone]) (None) Spoken Language Expression Discharge Status [phone]) (None) Attention Current Status (D2550) (None) Attention Goal Status (I1642) (None) Attention Discharge Status [phone]) (None) Memory Current Status (D5831) (None) Memory Goal Status (A7425) (None) Memory Discharge Status (L2589) (None) Voice Current Status (U8347) (None) Voice Goal Status (H8307) (None) Voice Discharge Status (O6002) (None) Other Speech-Language Pathology Functional Limitation Current Status (B8473) (None) Other Speech-Language Pathology Functional Limitation Goal Status (G8569) (None) Other Speech-Language Pathology Functional Limitation Discharge Status [phone]) (None) [name] 03/15/2017, 12:46 PM [name].Ed CCC-SLP Pager [phone]               Subjective: Eager to go home  Discharge Exam: Vitals:   03/18/17 0800 03/18/17 0844  BP: (!) 130/93   Pulse: 67   Resp: 17   Temp: 97.9 F (36.6 C)   SpO2: 99% 98%   Vitals:   03/18/17 0108 03/18/17 0500 03/18/17 0800 03/18/17 0844  BP: 116/62 116/68 (!) 130/93   Pulse: 68 80 67   Resp: '20 16 17   ' Temp: 97.9 F (36.6 C) 98 F (36.7 C) 97.9 F (36.6 C)   TempSrc: Oral Oral Oral   SpO2: 98% 96% 99% 98%  Weight:  71.5 kg (157 lb 10.1 oz)    Height:        General: Pt is alert, awake, not in acute distress Cardiovascular: RRR, S1/S2 +, no rubs, no gallops Respiratory: CTA bilaterally, no wheezing, no rhonchi Abdominal: Soft, NT, ND, bowel sounds + Extremities: no edema, no cyanosis   The results of significant diagnostics from this hospitalization (including imaging, microbiology, ancillary and laboratory) are listed below for reference.     Microbiology: Recent Results (from the past 240 hour(s))  Culture, blood (Routine x 2)     Status: None   Collection Time: 03/13/17  6:35 PM  Result Value Ref Range Status   Specimen Description  BLOOD BLOOD RIGHT HAND  Final   Special Requests   Final    BOTTLES DRAWN AEROBIC AND ANAEROBIC Blood Culture adequate volume   Culture NO GROWTH 5 DAYS  Final   Report Status 03/18/2017 FINAL  Final  Culture, blood (Routine x 2)     Status: None   Collection Time: 03/13/17  6:40 PM  Result Value Ref Range Status   Specimen Description BLOOD BLOOD RIGHT WRIST  Final   Special Requests   Final    BOTTLES DRAWN AEROBIC AND ANAEROBIC Blood Culture adequate volume   Culture NO GROWTH 5 DAYS  Final   Report Status 03/18/2017 FINAL  Final  Respiratory Panel by PCR     Status: None   Collection Time: 03/13/17  8:42 PM  Result Value Ref Range Status   Adenovirus NOT DETECTED NOT DETECTED Final   Coronavirus 229E NOT DETECTED NOT DETECTED Final   Coronavirus HKU1 NOT DETECTED NOT DETECTED Final   Coronavirus NL63 NOT DETECTED NOT DETECTED Final   Coronavirus OC43 NOT DETECTED NOT DETECTED Final   Metapneumovirus NOT DETECTED NOT DETECTED Final   Rhinovirus / Enterovirus NOT DETECTED NOT DETECTED Final   Influenza A NOT DETECTED NOT DETECTED Final   Influenza B NOT DETECTED NOT DETECTED Final   Parainfluenza Virus 1 NOT DETECTED NOT DETECTED Final   Parainfluenza Virus 2 NOT DETECTED NOT  DETECTED Final   Parainfluenza Virus 3 NOT DETECTED NOT DETECTED Final   Parainfluenza Virus 4 NOT DETECTED NOT DETECTED Final   Respiratory Syncytial Virus NOT DETECTED NOT DETECTED Final   Bordetella pertussis NOT DETECTED NOT DETECTED Final   Chlamydophila pneumoniae NOT DETECTED NOT DETECTED Final   Mycoplasma pneumoniae NOT DETECTED NOT DETECTED Final  Urine Culture     Status: Abnormal   Collection Time: 03/13/17 10:57 PM  Result Value Ref Range Status   Specimen Description URINE, CLEAN CATCH  Final   Special Requests NONE  Final   Culture <10,000 COLONIES/mL INSIGNIFICANT GROWTH (A)  Final   Report Status 03/15/2017 FINAL  Final     Labs: BNP (last 3 results) Recent Labs    11/09/16 1625  03/14/17 0345 03/16/17 0223  BNP 7.5 21.6 450.3*   Basic Metabolic Panel: Recent Labs  Lab 03/13/17 1834 03/15/17 0308 03/16/17 0223 03/17/17 0332 03/18/17 0240  NA 139 138 138 139 137  K 3.9 3.8 3.5 3.1* 3.6  CL 104 110 109 107 106  CO2 22 20* 20* 26 26  GLUCOSE 162* 155* 163* 121* 117*  BUN 20 18 22* 23* 19  CREATININE 0.82 0.60* 0.70 0.71 0.68  CALCIUM 9.0 8.6* 8.4* 8.2* 8.1*  MG  --  2.2  --  2.2  --   PHOS  --  2.8  --   --   --    Liver Function Tests: Recent Labs  Lab 03/13/17 1834 03/15/17 0308  AST 20 21  ALT 18 15*  ALKPHOS 127* 81  BILITOT 0.4 0.6  PROT 7.2 5.9*  ALBUMIN 4.0 2.9*   No results for input(s): LIPASE, AMYLASE in the last 168 hours. No results for input(s): AMMONIA in the last 168 hours. CBC: Recent Labs  Lab 03/13/17 1834 03/15/17 0308 03/16/17 0223  WBC 16.6* 6.4 5.4  NEUTROABS 14.0* 5.4  --   HGB 12.4* 9.7* 10.0*  HCT 37.7* 30.5* 30.5*  MCV 82.0 83.1 82.4  PLT 256 159 190   Cardiac Enzymes: No results for input(s): CKTOTAL, CKMB, CKMBINDEX, TROPONINI in the last 168 hours. BNP: Invalid input(s): POCBNP CBG: Recent Labs  Lab 03/17/17 0622 03/17/17 1142 03/17/17 1647 03/17/17 2114 03/18/17 0609  GLUCAP 112* 211* 124* 155* 137*   D-Dimer No results for input(s): DDIMER in the last 72 hours. Hgb A1c No results for input(s): HGBA1C in the last 72 hours. Lipid Profile No results for input(s): CHOL, HDL, LDLCALC, TRIG, CHOLHDL, LDLDIRECT in the last 72 hours. Thyroid function studies No results for input(s): TSH, T4TOTAL, T3FREE, THYROIDAB in the last 72 hours.  Invalid input(s): FREET3 Anemia work up No results for input(s): VITAMINB12, FOLATE, FERRITIN, TIBC, IRON, RETICCTPCT in the last 72 hours. Urinalysis    Component Value Date/Time   COLORURINE YELLOW 03/13/2017 2107   APPEARANCEUR CLEAR 03/13/2017 2107   LABSPEC 1.021 03/13/2017 2107   PHURINE 5.0 03/13/2017 2107   GLUCOSEU >=500 (A) 03/13/2017 2107    HGBUR NEGATIVE 03/13/2017 2107   BILIRUBINUR NEGATIVE 03/13/2017 2107   [place] NEGATIVE 03/13/2017 2107   PROTEINUR NEGATIVE 03/13/2017 2107   UROBILINOGEN 1.0 05/26/2014 2211   NITRITE NEGATIVE 03/13/2017 2107   LEUKOCYTESUR NEGATIVE 03/13/2017 2107   Sepsis Labs Invalid input(s): PROCALCITONIN,  WBC,  LACTICIDVEN Microbiology Recent Results (from the past 240 hour(s))  Culture, blood (Routine x 2)     Status: None   Collection Time: 03/13/17  6:35 PM  Result Value Ref Range Status   Specimen Description  BLOOD BLOOD RIGHT HAND  Final   Special Requests   Final    BOTTLES DRAWN AEROBIC AND ANAEROBIC Blood Culture adequate volume   Culture NO GROWTH 5 DAYS  Final   Report Status 03/18/2017 FINAL  Final  Culture, blood (Routine x 2)     Status: None   Collection Time: 03/13/17  6:40 PM  Result Value Ref Range Status   Specimen Description BLOOD BLOOD RIGHT WRIST  Final   Special Requests   Final    BOTTLES DRAWN AEROBIC AND ANAEROBIC Blood Culture adequate volume   Culture NO GROWTH 5 DAYS  Final   Report Status 03/18/2017 FINAL  Final  Respiratory Panel by PCR     Status: None   Collection Time: 03/13/17  8:42 PM  Result Value Ref Range Status   Adenovirus NOT DETECTED NOT DETECTED Final   Coronavirus 229E NOT DETECTED NOT DETECTED Final   Coronavirus HKU1 NOT DETECTED NOT DETECTED Final   Coronavirus NL63 NOT DETECTED NOT DETECTED Final   Coronavirus OC43 NOT DETECTED NOT DETECTED Final   Metapneumovirus NOT DETECTED NOT DETECTED Final   Rhinovirus / Enterovirus NOT DETECTED NOT DETECTED Final   Influenza A NOT DETECTED NOT DETECTED Final   Influenza B NOT DETECTED NOT DETECTED Final   Parainfluenza Virus 1 NOT DETECTED NOT DETECTED Final   Parainfluenza Virus 2 NOT DETECTED NOT DETECTED Final   Parainfluenza Virus 3 NOT DETECTED NOT DETECTED Final   Parainfluenza Virus 4 NOT DETECTED NOT DETECTED Final   Respiratory Syncytial Virus NOT DETECTED NOT DETECTED Final    Bordetella pertussis NOT DETECTED NOT DETECTED Final   Chlamydophila pneumoniae NOT DETECTED NOT DETECTED Final   Mycoplasma pneumoniae NOT DETECTED NOT DETECTED Final  Urine Culture     Status: Abnormal   Collection Time: 03/13/17 10:57 PM  Result Value Ref Range Status   Specimen Description URINE, CLEAN CATCH  Final   Special Requests NONE  Final   Culture <10,000 COLONIES/mL INSIGNIFICANT GROWTH (A)  Final   Report Status 03/15/2017 FINAL  Final     SIGNED:   Marylu Lund, MD  [facility] 03/18/2017, 11:33 AM  If 7PM-7AM, please contact night-coverage [email or web address] Password [email or web address]

## 2017-03-18 NOTE — Care Management Important Message (Signed)
Important Message  Patient Details  Name: Thomas Ramos MRN: [id number] Date of Birth: Oct 26, 1970   Medicare Important Message Given:  Yes    [name] Abena 03/18/2017, 9:59 AM

## 2017-03-25 ENCOUNTER — Telehealth: Payer: Self-pay | Admitting: Gastroenterology

## 2017-03-25 ENCOUNTER — Encounter (HOSPITAL_COMMUNITY): Payer: Self-pay | Admitting: Emergency Medicine

## 2017-03-25 ENCOUNTER — Other Ambulatory Visit: Payer: Self-pay

## 2017-03-25 NOTE — Telephone Encounter (Signed)
As far as I know, he has not had any recent swallowing tests. He had the CT scan chest done during ED visit a few months ago, and is scheduled for an EGD with Botox soon. Please tell me if you know differently.

## 2017-03-25 NOTE — Telephone Encounter (Signed)
Looks like he had a DG swallow test on 03/15/17, ordered by D. Hongalgi. Let patient know if he wanted results he should contact their office, but that I would let Dr. Loletha Carrow know to view results.

## 2017-03-25 NOTE — Telephone Encounter (Signed)
Routed to Dr. Loletha Carrow, patient is scheduled for EGD at [facility] on 04/06/17.

## 2017-03-25 NOTE — Telephone Encounter (Signed)
Ok. I see that study now. He will not be able to call Dr. Algis Liming for results - he is a hospitalist. Looks like he was admitted recently for bronchitis and possible congestive heart failure - I wasn't aware of that until now.  Thomas Ramos had a swallow study done with a speech pathologist, and it showed that his swallow function is a little weak as a result of his CP.  That is very common with his condition.  They recommended a special consistency of diet to reduct the risk of aspiration.  I also see from the discharge summary that [name] went home on supplemental oxygen because of the respiratory infection and fluid in his lungs. As such, he will not have recovered from that enough to have an EGD on 1/22.  Please cancel that procedure and have him see me the week of Feb 11th (looks like I have open office time 2/12/ and 2/13).  I would like to evaluate him then and make sure he is ready for the EGD.  If so, I will find a date soon after that.

## 2017-03-26 ENCOUNTER — Telehealth: Payer: Self-pay | Admitting: Gastroenterology

## 2017-03-26 NOTE — Telephone Encounter (Signed)
Spoke to patient, he is aware that for now we need to cancel his procedure on 1/22 at [facility]. Spoke to [place] in scheduling and cancelled it. Patient will come into office on 2/12 for follow up to see if okay to reschedule EGD.

## 2017-04-06 ENCOUNTER — Ambulatory Visit (HOSPITAL_COMMUNITY): Admit: 2017-04-06 | Payer: Medicare Other | Admitting: Gastroenterology

## 2017-04-06 SURGERY — ESOPHAGOGASTRODUODENOSCOPY (EGD) WITH PROPOFOL
Anesthesia: Monitor Anesthesia Care

## 2017-04-27 ENCOUNTER — Ambulatory Visit: Payer: Medicare Other | Admitting: Gastroenterology

## 2017-04-28 ENCOUNTER — Ambulatory Visit: Payer: Medicare Other | Admitting: Gastroenterology

## 2017-05-05 ENCOUNTER — Encounter: Payer: Self-pay | Admitting: Pulmonary Disease

## 2017-05-05 ENCOUNTER — Ambulatory Visit (INDEPENDENT_AMBULATORY_CARE_PROVIDER_SITE_OTHER): Payer: Medicare Other | Admitting: Pulmonary Disease

## 2017-05-05 VITALS — BP 128/72 | HR 100

## 2017-05-05 DIAGNOSIS — J45909 Unspecified asthma, uncomplicated: Secondary | ICD-10-CM | POA: Diagnosis not present

## 2017-05-05 DIAGNOSIS — G4733 Obstructive sleep apnea (adult) (pediatric): Secondary | ICD-10-CM

## 2017-05-05 NOTE — Progress Notes (Signed)
[facility]  Chief Complaint  Patient presents with  . Follow-up    Pt is not using the cpap machine, tried it and have DME pick it up.    Vital signs: BP 128/72 (BP Location: Left Arm, Cuff Size: Normal)   Pulse 100   SpO2 99%   History of Present Illness: Thomas Ramos is a 47 y.o. male with obstructive sleep apnea and asthma.  He was previously seen by Dr. Corrie Dandy.  His download for the last 90 days shows he has used in every night.  He swears that he hasn't been using CPAP and the his DME picked up the machine.  He is not having cough, wheeze, sputum, fever, sinus congestion, or chest pain.  He does snore when he is not using CPAP.  Physical Exam:  General - pleasant, sitting in wheelchair Eyes - pupils reactive ENT - no sinus tenderness, no oral exudate, no LAN, MP 3, 2+ tonsils Cardiac - regular, no murmur Chest - no wheeze, rales Abd - soft, non tender Ext - no edema Skin - no rashes Neuro - contracted in extremities, stuttering speech Psych - normal mood   Assessment/Plan:  Obstructive sleep apnea. - will contact his DME to determine whether he has a CPAP machine - if he doesn't have a machine, will need to clarify why there is still data on CPAP usage  Persistent asthma. - continue singulair and prn albuterol  Time spent 28 minutes.   Patient Instructions  Will call you once we find out the status of your CPAP machine  Follow up in 6 months   Chesley Mires, MD [place] 05/05/2017, 2:14 PM Pager:  [phone]  Flow Sheet  Pulmonary tests: CT angio chest 08/15/16 >> large hiatal hernia  Sleep tests: Auto CPAP 02/03/17 to 05/03/17 >> used on 90 of 90 nights with average 7 hrs 13 min.  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  Past Surgical History: He  has a past surgical history that includes Tendon release; Mouth surgery; Esophagus surgery; legs; Esophagogastroduodenoscopy (N/A, 12/21/2012); Botox injection (N/A, 12/21/2012); Esophagogastroduodenoscopy (N/A, 06/28/2014); and Botox injection (N/A, 06/28/2014).  Family History: His family history includes Diabetes in his mother; Hypertension in his father; Lung cancer in his father.  Social History: He  reports that  has never smoked. He has quit using smokeless tobacco. He reports that he drinks alcohol. He reports that he does not use drugs.  Medications: Allergies as of 05/05/2017      Reactions   Sulfamethoxazole-trimethoprim Nausea And Vomiting   Metronidazole Nausea And Vomiting   Penicillins Hives, Nausea And Vomiting, Other (See Comments)   Patient tolerated cefazolin in 2017 Has patient had a PCN reaction causing immediate rash, facial/tongue/throat swelling, SOB or lightheadedness with hypotension: Yes Has patient had a PCN reaction causing severe rash involving mucus membranes or skin necrosis: No Has patient had a PCN reaction that required hospitalization No Has patient had a PCN reaction occurring within the last 10 years: Yes If all of the above answers  are "NO", then may proceed with Cephalosporin use.      Medication List        Accurate as of 05/05/17  2:14 PM. Always use your most recent med list.          albuterol 108 (90 Base) MCG/ACT inhaler Commonly known as:  PROVENTIL HFA;VENTOLIN HFA Inhale 2 puffs into the lungs every 6 (six) hours as needed for wheezing or shortness of breath.  Use inhaler 3 times daily x 5 days, then every 6 hours as needed.   amLODipine 10 MG tablet Commonly known as:  NORVASC Take 10 mg by mouth daily.   fluticasone 50 MCG/ACT nasal spray Commonly known as:  FLONASE Place 1 spray into both nostrils daily as needed for rhinitis.   furosemide 40 MG tablet Commonly known as:  LASIX Take 1 tablet (40 mg total) by mouth daily.   latanoprost 0.005 % ophthalmic solution Commonly known as:  XALATAN Place 1 drop into both eyes at bedtime.   metFORMIN 500 MG 24 hr tablet Commonly known as:  GLUCOPHAGE-XR Take 500 mg by mouth at bedtime.   montelukast 10 MG tablet Commonly known as:  SINGULAIR Take 10 mg by mouth every morning.   nitroGLYCERIN 0.4 MG SL tablet Commonly known as:  NITROSTAT Place 0.4 mg under the tongue every 5 (five) minutes as needed for chest pain.   omeprazole 20 MG capsule Commonly known as:  PRILOSEC Take 20 mg by mouth daily.   ondansetron 4 MG tablet Commonly known as:  ZOFRAN Take 1 tablet (4 mg total) by mouth every 6 (six) hours.   potassium chloride 10 MEQ tablet Commonly known as:  K-DUR Take 10 mEq by mouth daily.   predniSONE 5 MG tablet Commonly known as:  DELTASONE Take 1 tablet (5 mg total) by mouth daily with breakfast.   rosuvastatin 5 MG tablet Commonly known as:  CRESTOR Take 5 mg by mouth at bedtime.   timolol 0.5 % ophthalmic solution Commonly known as:  TIMOPTIC Place 1 drop into both eyes every 12 (twelve) hours.   valsartan 80 MG tablet Commonly known as:  DIOVAN Take 80 mg by mouth daily.

## 2017-05-05 NOTE — Patient Instructions (Signed)
Will call you once we find out the status of your CPAP machine  Follow up in 6 months

## 2017-05-07 ENCOUNTER — Encounter (HOSPITAL_COMMUNITY): Payer: Self-pay | Admitting: Emergency Medicine

## 2017-05-07 ENCOUNTER — Emergency Department (HOSPITAL_COMMUNITY): Payer: Medicare Other

## 2017-05-07 ENCOUNTER — Inpatient Hospital Stay (HOSPITAL_COMMUNITY)
Admission: EM | Admit: 2017-05-07 | Discharge: 2017-05-09 | DRG: 194 | Disposition: A | Discharge status: Home / Self Care | Payer: Medicare Other | Attending: Family Medicine | Admitting: Family Medicine

## 2017-05-07 DIAGNOSIS — Z880 Allergy status to penicillin: Secondary | ICD-10-CM | POA: Diagnosis not present

## 2017-05-07 DIAGNOSIS — I5032 Chronic diastolic (congestive) heart failure: Secondary | ICD-10-CM | POA: Diagnosis present

## 2017-05-07 DIAGNOSIS — I110 Hypertensive heart disease with heart failure: Secondary | ICD-10-CM | POA: Diagnosis present

## 2017-05-07 DIAGNOSIS — R6510 Systemic inflammatory response syndrome (SIRS) of non-infectious origin without acute organ dysfunction: Secondary | ICD-10-CM | POA: Diagnosis present

## 2017-05-07 DIAGNOSIS — J45909 Unspecified asthma, uncomplicated: Secondary | ICD-10-CM | POA: Diagnosis present

## 2017-05-07 DIAGNOSIS — Z79899 Other long term (current) drug therapy: Secondary | ICD-10-CM

## 2017-05-07 DIAGNOSIS — J189 Pneumonia, unspecified organism: Principal | ICD-10-CM | POA: Diagnosis present

## 2017-05-07 DIAGNOSIS — K219 Gastro-esophageal reflux disease without esophagitis: Secondary | ICD-10-CM | POA: Diagnosis present

## 2017-05-07 DIAGNOSIS — G808 Other cerebral palsy: Secondary | ICD-10-CM | POA: Diagnosis present

## 2017-05-07 DIAGNOSIS — Z87891 Personal history of nicotine dependence: Secondary | ICD-10-CM | POA: Diagnosis not present

## 2017-05-07 DIAGNOSIS — E876 Hypokalemia: Secondary | ICD-10-CM | POA: Diagnosis present

## 2017-05-07 DIAGNOSIS — Z7984 Long term (current) use of oral hypoglycemic drugs: Secondary | ICD-10-CM | POA: Diagnosis not present

## 2017-05-07 DIAGNOSIS — R1310 Dysphagia, unspecified: Secondary | ICD-10-CM | POA: Diagnosis present

## 2017-05-07 DIAGNOSIS — F419 Anxiety disorder, unspecified: Secondary | ICD-10-CM | POA: Diagnosis present

## 2017-05-07 DIAGNOSIS — R509 Fever, unspecified: Secondary | ICD-10-CM | POA: Diagnosis present

## 2017-05-07 DIAGNOSIS — Y95 Nosocomial condition: Secondary | ICD-10-CM | POA: Diagnosis present

## 2017-05-07 DIAGNOSIS — G809 Cerebral palsy, unspecified: Secondary | ICD-10-CM | POA: Diagnosis not present

## 2017-05-07 DIAGNOSIS — E119 Type 2 diabetes mellitus without complications: Secondary | ICD-10-CM | POA: Diagnosis present

## 2017-05-07 LAB — URINALYSIS, ROUTINE W REFLEX MICROSCOPIC
Bilirubin Urine: NEGATIVE
GLUCOSE, UA: NEGATIVE mg/dL
HGB URINE DIPSTICK: NEGATIVE
Ketones, ur: NEGATIVE mg/dL
Leukocytes, UA: NEGATIVE
Nitrite: NEGATIVE
Protein, ur: NEGATIVE mg/dL
SPECIFIC GRAVITY, URINE: 1.006 (ref 1.005–1.030)
pH: 6 (ref 5.0–8.0)

## 2017-05-07 LAB — COMPREHENSIVE METABOLIC PANEL
ALBUMIN: 4.2 g/dL (ref 3.5–5.0)
ALK PHOS: 125 U/L (ref 38–126)
ALT: 27 U/L (ref 17–63)
AST: 23 U/L (ref 15–41)
Anion gap: 11 (ref 5–15)
BILIRUBIN TOTAL: 0.5 mg/dL (ref 0.3–1.2)
BUN: 13 mg/dL (ref 6–20)
CALCIUM: 9.1 mg/dL (ref 8.9–10.3)
CO2: 24 mmol/L (ref 22–32)
CREATININE: 0.65 mg/dL (ref 0.61–1.24)
Chloride: 102 mmol/L (ref 101–111)
GFR calc Af Amer: >60 mL/min (ref >60)
GFR calc non Af Amer: >60 mL/min (ref >60)
Glucose, Bld: 118 mg/dL — ABNORMAL HIGH (ref 65–99)
Potassium: 3.7 mmol/L (ref 3.5–5.1)
Sodium: 137 mmol/L (ref 135–145)
TOTAL PROTEIN: 7.5 g/dL (ref 6.5–8.1)

## 2017-05-07 LAB — CBC WITH DIFFERENTIAL/PLATELET
BASOS ABS: 0 10*3/uL (ref 0.0–0.1)
BASOS PCT: 0 %
Eosinophils Absolute: 0.1 10*3/uL (ref 0.0–0.7)
Eosinophils Relative: 1 %
HCT: 37.6 % — ABNORMAL LOW (ref 39.0–52.0)
HEMOGLOBIN: 12.5 g/dL — AB (ref 13.0–17.0)
LYMPHS PCT: 12 %
Lymphs Abs: 0.8 10*3/uL (ref 0.7–4.0)
MCH: 27 pg (ref 26.0–34.0)
MCHC: 33.2 g/dL (ref 30.0–36.0)
MCV: 81.2 fL (ref 78.0–100.0)
MONOS PCT: 9 %
Monocytes Absolute: 0.6 10*3/uL (ref 0.1–1.0)
NEUTROS ABS: 5.1 10*3/uL (ref 1.7–7.7)
NEUTROS PCT: 78 %
Platelets: 203 10*3/uL (ref 150–400)
RBC: 4.63 MIL/uL (ref 4.22–5.81)
RDW: 12.7 % (ref 11.5–15.5)
WBC: 6.6 10*3/uL (ref 4.0–10.5)

## 2017-05-07 LAB — INFLUENZA PANEL BY PCR (TYPE A & B)
Influenza A By PCR: NEGATIVE
Influenza B By PCR: NEGATIVE

## 2017-05-07 LAB — GLUCOSE, CAPILLARY: GLUCOSE-CAPILLARY: 131 mg/dL — AB (ref 65–99)

## 2017-05-07 LAB — I-STAT TROPONIN, ED: Troponin i, poc: 0 ng/mL (ref 0.00–0.08)

## 2017-05-07 LAB — CBG MONITORING, ED: Glucose-Capillary: 162 mg/dL — ABNORMAL HIGH (ref 65–99)

## 2017-05-07 LAB — I-STAT CG4 LACTIC ACID, ED: Lactic Acid, Venous: 0.61 mmol/L (ref 0.5–1.9)

## 2017-05-07 LAB — BRAIN NATRIURETIC PEPTIDE: B NATRIURETIC PEPTIDE 5: 7 pg/mL (ref 0.0–100.0)

## 2017-05-07 MED ORDER — VANCOMYCIN HCL 10 G IV SOLR
1500.0000 mg | Freq: Once | INTRAVENOUS | Status: AC
  Administered 2017-05-07: 1500 mg via INTRAVENOUS
  Filled 2017-05-07: qty 1500

## 2017-05-07 MED ORDER — LATANOPROST 0.005 % OP SOLN
1.0000 [drp] | Freq: Every day | OPHTHALMIC | Status: DC
  Administered 2017-05-07 – 2017-05-08 (×2): 1 [drp] via OPHTHALMIC
  Filled 2017-05-07: qty 2.5

## 2017-05-07 MED ORDER — OSELTAMIVIR PHOSPHATE 75 MG PO CAPS
75.0000 mg | ORAL_CAPSULE | Freq: Two times a day (BID) | ORAL | Status: DC
  Administered 2017-05-07: 75 mg via ORAL
  Filled 2017-05-07 (×2): qty 1

## 2017-05-07 MED ORDER — SODIUM CHLORIDE 0.9 % IV SOLN: 2.0000 g | Freq: Three times a day (TID) | INTRAVENOUS | Status: DC

## 2017-05-07 MED ORDER — SODIUM CHLORIDE 0.9 % IV BOLUS (SEPSIS)
1000.0000 mL | Freq: Once | INTRAVENOUS | Status: AC
  Administered 2017-05-07: 1000 mL via INTRAVENOUS

## 2017-05-07 MED ORDER — SODIUM CHLORIDE 0.9% FLUSH
3.0000 mL | Freq: Two times a day (BID) | INTRAVENOUS | Status: DC
  Administered 2017-05-07 – 2017-05-09 (×3): 3 mL via INTRAVENOUS

## 2017-05-07 MED ORDER — ALBUTEROL SULFATE (2.5 MG/3ML) 0.083% IN NEBU: 2.5000 mg | INHALATION_SOLUTION | Freq: Four times a day (QID) | RESPIRATORY_TRACT | Status: DC | PRN

## 2017-05-07 MED ORDER — SODIUM CHLORIDE 0.9% FLUSH
3.0000 mL | INTRAVENOUS | Status: DC | PRN
  Administered 2017-05-08: 3 mL via INTRAVENOUS
  Filled 2017-05-07: qty 3

## 2017-05-07 MED ORDER — FLUTICASONE PROPIONATE 50 MCG/ACT NA SUSP: 1.0000 | Freq: Every day | NASAL | Status: DC | PRN

## 2017-05-07 MED ORDER — MONTELUKAST SODIUM 10 MG PO TABS
10.0000 mg | ORAL_TABLET | Freq: Every morning | ORAL | Status: DC
  Administered 2017-05-08 – 2017-05-09 (×2): 10 mg via ORAL
  Filled 2017-05-07 (×2): qty 1

## 2017-05-07 MED ORDER — ACETAMINOPHEN 500 MG PO TABS
1000.0000 mg | ORAL_TABLET | Freq: Once | ORAL | Status: AC
  Administered 2017-05-07: 1000 mg via ORAL
  Filled 2017-05-07: qty 2

## 2017-05-07 MED ORDER — ROSUVASTATIN CALCIUM 5 MG PO TABS
5.0000 mg | ORAL_TABLET | Freq: Every day | ORAL | Status: DC
  Administered 2017-05-07 – 2017-05-08 (×2): 5 mg via ORAL
  Filled 2017-05-07 (×2): qty 1

## 2017-05-07 MED ORDER — AMLODIPINE BESYLATE 5 MG PO TABS
10.0000 mg | ORAL_TABLET | Freq: Every day | ORAL | Status: DC
  Administered 2017-05-08 – 2017-05-09 (×2): 10 mg via ORAL
  Filled 2017-05-07 (×2): qty 2

## 2017-05-07 MED ORDER — SODIUM CHLORIDE 0.9 % IV SOLN
250.0000 mL | INTRAVENOUS | Status: DC | PRN
Start: 2017-05-07 — End: 2017-05-09

## 2017-05-07 MED ORDER — SODIUM CHLORIDE 0.9 % IV SOLN
1.0000 g | Freq: Three times a day (TID) | INTRAVENOUS | Status: DC
  Administered 2017-05-07 – 2017-05-09 (×5): 1 g via INTRAVENOUS
  Filled 2017-05-07 (×8): qty 1

## 2017-05-07 MED ORDER — ONDANSETRON HCL 4 MG PO TABS: 4.0000 mg | ORAL_TABLET | Freq: Four times a day (QID) | ORAL | Status: DC | PRN

## 2017-05-07 MED ORDER — OSELTAMIVIR PHOSPHATE 75 MG PO CAPS
75.0000 mg | ORAL_CAPSULE | Freq: Once | ORAL | Status: AC
  Administered 2017-05-07: 75 mg via ORAL
  Filled 2017-05-07: qty 1

## 2017-05-07 MED ORDER — SUCRALFATE 1 GM/10ML PO SUSP
1.0000 g | Freq: Three times a day (TID) | ORAL | Status: DC
  Administered 2017-05-07 – 2017-05-09 (×6): 1 g via ORAL
  Filled 2017-05-07 (×6): qty 10

## 2017-05-07 MED ORDER — TIMOLOL MALEATE 0.5 % OP SOLN
1.0000 [drp] | Freq: Two times a day (BID) | OPHTHALMIC | Status: DC
  Administered 2017-05-07 – 2017-05-09 (×4): 1 [drp] via OPHTHALMIC
  Filled 2017-05-07: qty 5

## 2017-05-07 MED ORDER — POTASSIUM CHLORIDE ER 10 MEQ PO TBCR
10.0000 meq | EXTENDED_RELEASE_TABLET | Freq: Every day | ORAL | Status: DC
Start: 2017-05-08 — End: 2017-05-09
  Administered 2017-05-08 – 2017-05-09 (×2): 10 meq via ORAL
  Filled 2017-05-07 (×4): qty 1

## 2017-05-07 MED ORDER — CEFTRIAXONE SODIUM 1 G IJ SOLR
1.0000 g | Freq: Once | INTRAMUSCULAR | Status: AC
  Administered 2017-05-07: 1 g via INTRAVENOUS
  Filled 2017-05-07: qty 10

## 2017-05-07 MED ORDER — FUROSEMIDE 40 MG PO TABS
40.0000 mg | ORAL_TABLET | Freq: Every day | ORAL | Status: DC
  Administered 2017-05-08 – 2017-05-09 (×2): 40 mg via ORAL
  Filled 2017-05-07 (×2): qty 1

## 2017-05-07 MED ORDER — ONDANSETRON HCL 4 MG PO TABS: 4.0000 mg | ORAL_TABLET | Freq: Four times a day (QID) | ORAL | Status: DC

## 2017-05-07 MED ORDER — VANCOMYCIN HCL 10 G IV SOLR
1250.0000 mg | Freq: Two times a day (BID) | INTRAVENOUS | Status: DC
  Administered 2017-05-08 – 2017-05-09 (×3): 1250 mg via INTRAVENOUS
  Filled 2017-05-07 (×4): qty 1250

## 2017-05-07 MED ORDER — INSULIN ASPART 100 UNIT/ML ~~LOC~~ SOLN
0.0000 [IU] | Freq: Three times a day (TID) | SUBCUTANEOUS | Status: DC
  Administered 2017-05-08: 1 [IU] via SUBCUTANEOUS

## 2017-05-07 MED ORDER — SODIUM CHLORIDE 0.9 % IV SOLN
500.0000 mg | Freq: Once | INTRAVENOUS | Status: AC
  Administered 2017-05-07: 500 mg via INTRAVENOUS

## 2017-05-07 NOTE — ED Triage Notes (Signed)
Per EMS-states productive cough for 2 days-states coughing up a lot of phlegm-states HR elevated 135-lungs sounds clear, 98% on RA

## 2017-05-07 NOTE — ED Notes (Signed)
Bed: FE07 Expected date:  Expected time:  Means of arrival:  Comments: Hold-RN busy with 22

## 2017-05-07 NOTE — Progress Notes (Signed)
Pharmacy Antibiotic Note  Thomas Ramos is a 47 y.o. male with cerebral palsy admitted on 05/07/2017 with possible pneumonia.  Pharmacy has been consulted for vancomycin dosing.  Plan:  Vancomycin 1500mg  IV x 1, then 1250mg  IV q12h for estimated AUC 445  Check vancomycin levels if indicated, goal AUC 400-500  Follow up renal function & cultures  Cefepime 1g IV q8h  Recommend checking MRSA PCR to guide de-escalation of antibiotic therapy    Temp (24hrs), Avg:99.7 F (37.6 C), Min:98 F (36.7 C), Max:101.3 F (38.5 C)  Recent Labs  Lab 05/07/17 1354 05/07/17 1404  WBC 6.6  --   CREATININE 0.65  --   LATICACIDVEN  --  0.61    CrCl cannot be calculated (Unknown ideal weight.).    Allergies  Allergen Reactions  . Sulfamethoxazole-Trimethoprim Nausea And Vomiting  . Metronidazole Nausea And Vomiting  . Penicillins Hives, Nausea And Vomiting and Other (See Comments)    Patient tolerated cefazolin in 2017 Has patient had a PCN reaction causing immediate rash, facial/tongue/throat swelling, SOB or lightheadedness with hypotension: Yes Has patient had a PCN reaction causing severe rash involving mucus membranes or skin necrosis: No Has patient had a PCN reaction that required hospitalization No Has patient had a PCN reaction occurring within the last 10 years: Yes If all of the above answers are "NO", then may proceed with Cephalosporin use.    Antimicrobials this admission: 2/22 Vancomycin >> 2/22 Cefepime >> 2/22 Tamiflu >>  Dose adjustments this admission:   Microbiology results: 2/22 BCx:  2/22 Influenza panel: 2/22 Urine strep Ag:  Thank you for allowing pharmacy to be a part of this patient's care.  Peggyann Juba, PharmD, BCPS Pager: [phone] 05/07/2017 5:11 PM

## 2017-05-07 NOTE — ED Notes (Signed)
Patient transported to X-ray 

## 2017-05-07 NOTE — ED Notes (Signed)
ED TO INPATIENT HANDOFF REPORT  Name/Age/Gender Thomas Ramos 47 y.o. male  Code Status    Code Status Orders  (From admission, onward)        Start     Ordered   05/07/17 1658  Full code  Continuous     05/07/17 1658    Code Status History    Date Active Date Inactive Code Status Order ID Comments User Context   03/14/2017 01:48 03/18/2017 16:47 Full Code [id number]  Ivor Costa, MD ED   08/15/2016 20:23 08/18/2016 20:25 Full Code [id number]  Eugenie Filler, MD Inpatient   01/13/2016 19:11 01/15/2016 17:59 Full Code [id number]  Rosita Fire, MD Inpatient   10/06/2015 23:59 10/10/2015 15:56 Full Code [id number]  [name], MD ED   10/01/2015 20:02 10/05/2015 17:14 Full Code [id number]  Ivor Costa, MD ED   09/20/2015 23:43 09/23/2015 17:24 Full Code [id number]  [name], MD ED   05/25/2014 01:20 05/28/2014 23:17 Full Code [id number]  Allyne Gee, MD Inpatient      Home/SNF/Other Home  Chief Complaint Respiratory Issues  Level of Care/Admitting Diagnosis ED Disposition    ED Disposition Condition [facility] Area: [facility] [[id number]]  Level of Care: Med-Surg [16]  Diagnosis: PNA (pneumonia) [[id number]]  Admitting Physician: Phillips Grout [[id number]]  Attending Physician: Derrill Kay A [[id number]]  Estimated length of stay: 3 - 4 days  Certification:: I certify this patient will need inpatient services for at least 2 midnights  PT Class (Do Not Modify): Inpatient [101]  PT Acc Code (Do Not Modify): Private [1]       Medical History Past Medical History:  Diagnosis Date  . Anxiety   . Cerebral palsy ([place])   . Chronic diastolic (congestive) heart failure ([place])   . CP (cerebral palsy) ([place])   . Diabetes mellitus without complication (HCC)    borderline  . Environmental allergies    takes inhalers if needed  . Esophageal stricture   . GERD (gastroesophageal reflux disease)   . Hypertension   . Motility disorder, esophageal   .  Pneumonia   . 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 S/P Botox injection    approx every 4 months  . Seasonal allergies     Allergies Allergies  Allergen Reactions  . Sulfamethoxazole-Trimethoprim Nausea And Vomiting  . Metronidazole Nausea And Vomiting  . Penicillins Hives, Nausea And Vomiting and Other (See Comments)    Patient tolerated cefazolin in 2017 Has patient had a PCN reaction causing immediate rash, facial/tongue/throat swelling, SOB or lightheadedness with hypotension: Yes Has patient had a PCN reaction causing severe rash involving mucus membranes or skin necrosis: No Has patient had a PCN reaction that required hospitalization No Has patient had a PCN reaction occurring within the last 10 years: Yes If all of the above answers are "NO", then may proceed with Cephalosporin use.    IV Location/Drains/Wounds Patient Lines/Drains/Airways Status   Active Line/Drains/Airways    Name:   Placement date:   Placement time:   Site:   Days:   Peripheral IV 05/07/17 Left Wrist   05/07/17    1355    Wrist   less than 1   Peripheral IV 05/07/17 Left Antecubital   05/07/17    1406    Antecubital   less than 1   External Urinary Catheter   03/18/17    1145    -   50   Pressure  Injury 08/15/16 Stage I -  Intact skin with non-blanchable redness of a localized area usually over a bony prominence.   08/15/16    2100     265          Labs/Imaging Results for orders placed or performed during the hospital encounter of 05/07/17 (from the past 48 hour(s))  CBC with Differential/Platelet     Status: Abnormal   Collection Time: 05/07/17  1:54 PM  Result Value Ref Range   WBC 6.6 4.0 - 10.5 K/uL   RBC 4.63 4.22 - 5.81 MIL/uL   Hemoglobin 12.5 (L) 13.0 - 17.0 g/dL   HCT 37.6 (L) 39.0 - 52.0 %   MCV 81.2 78.0 - 100.0 fL   MCH 27.0 26.0 - 34.0 pg   MCHC 33.2 30.0 - 36.0 g/dL   RDW 12.7 11.5 - 15.5 %   Platelets 203 150 - 400 K/uL   Neutrophils Relative % 78 %   Neutro Abs 5.1 1.7 -  7.7 K/uL   Lymphocytes Relative 12 %   Lymphs Abs 0.8 0.7 - 4.0 K/uL   Monocytes Relative 9 %   Monocytes Absolute 0.6 0.1 - 1.0 K/uL   Eosinophils Relative 1 %   Eosinophils Absolute 0.1 0.0 - 0.7 K/uL   Basophils Relative 0 %   Basophils Absolute 0.0 0.0 - 0.1 K/uL    Comment: Performed at [facility], [place] [address], [place], [place] [address]  Comprehensive metabolic panel     Status: Abnormal   Collection Time: 05/07/17  1:54 PM  Result Value Ref Range   Sodium 137 135 - 145 mmol/L   Potassium 3.7 3.5 - 5.1 mmol/L   Chloride 102 101 - 111 mmol/L   CO2 24 22 - 32 mmol/L   Glucose, Bld 118 (H) 65 - 99 mg/dL   BUN 13 6 - 20 mg/dL   Creatinine, Ser 0.65 0.61 - 1.24 mg/dL   Calcium 9.1 8.9 - 10.3 mg/dL   Total Protein 7.5 6.5 - 8.1 g/dL   Albumin 4.2 3.5 - 5.0 g/dL   AST 23 15 - 41 U/L   ALT 27 17 - 63 U/L   Alkaline Phosphatase 125 38 - 126 U/L   Total Bilirubin 0.5 0.3 - 1.2 mg/dL   GFR calc non Af Amer >60 >60 mL/min   GFR calc Af Amer >60 >60 mL/min    Comment: (NOTE) The eGFR has been calculated using the CKD EPI equation. This calculation has not been validated in all clinical situations. eGFR's persistently <60 mL/min signify possible Chronic Kidney Disease.    Anion gap 11 5 - 15    Comment: Performed at [facility], [place] [address], [place], [place] [address]  Brain natriuretic peptide     Status: None   Collection Time: 05/07/17  1:55 PM  Result Value Ref Range   B Natriuretic Peptide 7.0 0.0 - 100.0 pg/mL    Comment: Performed at [facility], [place] [address] [place], [place] [address]  I-stat troponin, ED     Status: None   Collection Time: 05/07/17  2:02 PM  Result Value Ref Range   Troponin i, poc 0.00 0.00 - 0.08 ng/mL   Comment 3            Comment: Due to the release kinetics of cTnI, a negative result within the first hours of the onset of symptoms does not rule out myocardial infarction with  certainty. If myocardial infarction  is still suspected, repeat the test at appropriate intervals.   Urinalysis, Routine w reflex microscopic     Status: Abnormal   Collection Time: 05/07/17  2:03 PM  Result Value Ref Range   Color, Urine STRAW (A) YELLOW   APPearance CLEAR CLEAR   Specific Gravity, Urine 1.006 1.005 - 1.030   pH 6.0 5.0 - 8.0   Glucose, UA NEGATIVE NEGATIVE mg/dL   Hgb urine dipstick NEGATIVE NEGATIVE   Bilirubin Urine NEGATIVE NEGATIVE   Ketones, ur NEGATIVE NEGATIVE mg/dL   Protein, ur NEGATIVE NEGATIVE mg/dL   Nitrite NEGATIVE NEGATIVE   Leukocytes, UA NEGATIVE NEGATIVE    Comment: Performed at [facility] [address] [place], [place] [address]  I-Stat CG4 Lactic Acid, ED     Status: None   Collection Time: 05/07/17  2:04 PM  Result Value Ref Range   Lactic Acid, Venous 0.61 0.5 - 1.9 mmol/L  Influenza panel by PCR (type A & B)     Status: None   Collection Time: 05/07/17  2:13 PM  Result Value Ref Range   Influenza A By PCR NEGATIVE NEGATIVE   Influenza B By PCR NEGATIVE NEGATIVE    Comment: (NOTE) The Xpert Xpress Flu assay is intended as an aid in the diagnosis of  influenza and should not be used as a sole basis for treatment.  This  assay is FDA approved for nasopharyngeal swab specimens only. Nasal  washings and aspirates are unacceptable for Xpert Xpress Flu testing. Performed at [facility], [place] [address] [place], [place] [address]   CBG monitoring, ED     Status: Abnormal   Collection Time: 05/07/17  6:11 PM  Result Value Ref Range   Glucose-Capillary 162 (H) 65 - 99 mg/dL   Dg Chest 2 View  Result Date: 05/07/2017 CLINICAL DATA:  Productive cough and congestion x1 day. EXAM: CHEST  2 VIEW COMPARISON:  03/17/2017 FINDINGS: Lung volumes are low. Heart is borderline enlarged. No aortic aneurysm. Mild vascular congestion noted. Colonic interposition over the liver shadow seen as before with elevation  of the right hemidiaphragm, chronic in appearance. No acute osseous abnormality. IMPRESSION: Low lung volumes are redemonstrated with crowding of interstitial lung markings and mild vascular congestion. No definite pneumonic consolidation. Electronically Signed   By: Andrzej Scully Royalty M.D.   On: 05/07/2017 13:45    Pending Labs Unresulted Labs (From admission, onward)   Start     Ordered   05/08/17 3662  Basic metabolic panel  Tomorrow morning,   R     05/07/17 1658   05/08/17 0500  CBC WITH DIFFERENTIAL  Tomorrow morning,   R     05/07/17 1658   05/07/17 1658  Culture, blood (routine x 2) Call MD if unable to obtain prior to antibiotics being given  BLOOD CULTURE X 2,   R    Comments:  If blood cultures drawn in Emergency Department - Do not draw and cancel order    05/07/17 1658   05/07/17 1658  Culture, sputum-assessment  Once,   R     05/07/17 1658   05/07/17 1658  Gram stain  Once,   R     05/07/17 1658   05/07/17 1658  Strep pneumoniae urinary antigen  Once,   R     05/07/17 1658   05/07/17 1349  Blood culture (routine x 2)  BLOOD CULTURE X 2,   STAT     05/07/17 1348  Vitals/Pain Today's Vitals   05/07/17 1646 05/07/17 1700 05/07/17 1800 05/07/17 1830  BP:  118/72 124/80 134/86  Pulse:  92 88 93  Resp:  (!) 22 (!) 27 19  Temp:      TempSrc:      SpO2:  97% 93% 96%  PainSc: 10-Worst pain ever       Isolation Precautions No active isolations  Medications Medications  amLODipine (NORVASC) tablet 10 mg (not administered)  rosuvastatin (CRESTOR) tablet 5 mg (not administered)  montelukast (SINGULAIR) tablet 10 mg (not administered)  timolol (TIMOPTIC) 0.5 % ophthalmic solution 1 drop (not administered)  fluticasone (FLONASE) 50 MCG/ACT nasal spray 1 spray (not administered)  albuterol (PROVENTIL HFA;VENTOLIN HFA) 108 (90 Base) MCG/ACT inhaler 2 puff (not administered)  ondansetron (ZOFRAN) tablet 4 mg (not administered)  latanoprost (XALATAN) 0.005 % ophthalmic  solution 1 drop (not administered)  potassium chloride (K-DUR) CR tablet 10 mEq (not administered)  furosemide (LASIX) tablet 40 mg (not administered)  sucralfate (CARAFATE) 1 GM/10ML suspension 1 g (not administered)  sodium chloride flush (NS) 0.9 % injection 3 mL (not administered)  sodium chloride flush (NS) 0.9 % injection 3 mL (not administered)  0.9 %  sodium chloride infusion (not administered)  oseltamivir (TAMIFLU) capsule 75 mg (not administered)  insulin aspart (novoLOG) injection 0-9 Units (not administered)  vancomycin (VANCOCIN) 1,500 mg in sodium chloride 0.9 % 500 mL IVPB (1,500 mg Intravenous New Bag/Given 05/07/17 1805)  ceFEPIme (MAXIPIME) 1 g in sodium chloride 0.9 % 100 mL IVPB (not administered)  sodium chloride 0.9 % bolus 1,000 mL (1,000 mLs Intravenous New Bag/Given 05/07/17 1552)  oseltamivir (TAMIFLU) capsule 75 mg (75 mg Oral Given 05/07/17 1806)  cefTRIAXone (ROCEPHIN) 1 g in sodium chloride 0.9 % 100 mL IVPB (0 g Intravenous Stopped 05/07/17 1623)  azithromycin (ZITHROMAX) 500 mg in sodium chloride 0.9 % 250 mL IVPB (0 mg Intravenous Stopped 05/07/17 1744)  acetaminophen (TYLENOL) tablet 1,000 mg (1,000 mg Oral Given 05/07/17 1556)    Mobility {Mobility:[id number]

## 2017-05-07 NOTE — ED Provider Notes (Signed)
[facility] DEPT Provider Note   CSN: [id number] Arrival date & time: 05/07/17  1210     History   Chief Complaint Chief Complaint  Patient presents with  . productive cough    HPI Thomas Ramos is a 47 y.o. male with a hx of diastolic, DM, HTN, asthma, OSA, cerebral palsy, quadriplegic spinal parapysis, and anxiety who arrives to the ED via EMS for cough over the past 2 days. Patient reported to EMS that cough was productive- reported to me it was dry. States it has been progressively worsening. Has has associated malaise, subjective fever today without taking of temperature, sore throat, as well chest pain that is only occurring with coughing- otherwise none. States he has used his prescribed inhalers with some improvement. No other specific alleviating/aggravating factors. Denies dyspnea, palpitations, ear pain, or rhinorrhea. Patient is quadriplegic, lives at home alone with home health assistance.   HPI  Past Medical History:  Diagnosis Date  . Anxiety   . Cerebral palsy ([place])   . Chronic diastolic (congestive) heart failure ([place])   . CP (cerebral palsy) ([place])   . Diabetes mellitus without complication (HCC)    borderline  . Environmental allergies    takes inhalers if needed  . Esophageal stricture   . GERD (gastroesophageal reflux disease)   . Hypertension   . Motility disorder, esophageal   . Pneumonia   . 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 S/P Botox injection    approx every 4 months  . Seasonal allergies     Patient Active Problem List   Diagnosis Date Noted  . Acute respiratory failure with hypoxia ([place]) 03/13/2017  . 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 Rhinovirus infection 08/17/2016  . Pressure injury of skin 08/16/2016  . Acute respiratory distress 08/15/2016  . Tachycardia 08/15/2016  . Sepsis ([place]) 08/15/2016  . Onychomycosis 02/20/2016  . Essential hypertension   . OSA (obstructive sleep apnea) 12/11/2015  .  Chronic diastolic (congestive) heart failure ([place])   . Chronic venous insufficiency   . Depression with anxiety 09/20/2015  . CAP (community acquired pneumonia) 09/20/2015  . Hypersomnia 07/16/2015  . Asthma exacerbation 07/16/2015  . GERD (gastroesophageal reflux disease) 07/16/2015  . Hematemesis with nausea   . Dysphagia 09/16/2010  . 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 BOTOX INJECTION N/A 12/21/2012   Procedure: BOTOX INJECTION;  Surgeon: Inda Castle, MD;  Location: [facility];  Service: Endoscopy;  Laterality: N/A;  . BOTOX INJECTION N/A 06/28/2014   Procedure: BOTOX INJECTION;  Surgeon: Inda Castle, MD;  Location: [place];  Service: Endoscopy;  Laterality: N/A;  . ESOPHAGOGASTRODUODENOSCOPY N/A 12/21/2012   Procedure: ESOPHAGOGASTRODUODENOSCOPY (EGD);  Surgeon: Inda Castle, MD;  Location: [facility];  Service: Endoscopy;  Laterality: N/A;  . ESOPHAGOGASTRODUODENOSCOPY N/A 06/28/2014   Procedure: ESOPHAGOGASTRODUODENOSCOPY (EGD);  Surgeon: Inda Castle, MD;  Location: [place];  Service: Endoscopy;  Laterality: N/A;  . ESOPHAGUS SURGERY     stretched esophagus  . legs    . MOUTH SURGERY    . TENDON RELEASE         Home Medications    Prior to Admission medications   Medication Sig Start Date End Date Taking? Authorizing Provider  albuterol (PROVENTIL HFA;VENTOLIN HFA) 108 (90 Base) MCG/ACT inhaler Inhale 2 puffs into the lungs every 6 (six) hours as needed for wheezing or shortness of breath. Use inhaler 3 times daily  x 5 days, then every 6 hours as needed. Patient taking differently: Inhale 2 puffs into the lungs every 6 (six) hours as needed for wheezing or shortness of breath.  08/18/16   Eugenie Filler, MD  amLODipine (NORVASC) 10 MG tablet Take 10 mg by mouth daily.    [provider]  fluticasone (FLONASE) 50 MCG/ACT nasal spray Place 1 spray into both nostrils daily as  needed for rhinitis. 08/18/16   Eugenie Filler, MD  furosemide (LASIX) 40 MG tablet Take 1 tablet (40 mg total) by mouth daily. 03/18/17 03/18/18  Donne Hazel, MD  latanoprost (XALATAN) 0.005 % ophthalmic solution Place 1 drop into both eyes at bedtime.    [provider]  metFORMIN (GLUCOPHAGE-XR) 500 MG 24 hr tablet Take 500 mg by mouth at bedtime. 08/06/16 08/06/17  [provider]  montelukast (SINGULAIR) 10 MG tablet Take 10 mg by mouth every morning. 08/12/16   [provider]  nitroGLYCERIN (NITROSTAT) 0.4 MG SL tablet Place 0.4 mg under the tongue every 5 (five) minutes as needed for chest pain.     [provider]  omeprazole (PRILOSEC) 20 MG capsule Take 20 mg by mouth daily.    [provider]  ondansetron (ZOFRAN) 4 MG tablet Take 1 tablet (4 mg total) by mouth every 6 (six) hours. 02/05/17   Orpah Greek, MD  potassium chloride (K-DUR) 10 MEQ tablet Take 10 mEq by mouth daily.    [provider]  predniSONE (DELTASONE) 5 MG tablet Take 1 tablet (5 mg total) by mouth daily with breakfast. 03/18/17   Donne Hazel, MD  rosuvastatin (CRESTOR) 5 MG tablet Take 5 mg by mouth at bedtime.    [provider]  timolol (TIMOPTIC) 0.5 % ophthalmic solution Place 1 drop into both eyes every 12 (twelve) hours. 07/31/16   [provider]  valsartan (DIOVAN) 80 MG tablet Take 80 mg by mouth daily.    [provider]  sucralfate (CARAFATE) 1 GM/10ML suspension Take 10 mLs (1 g total) by mouth 4 (four) times daily -  with meals and at bedtime. Patient not taking: Reported on 09/20/2015 08/11/15 09/24/15  Randal Buba, April, MD    Family History Family History  Problem Relation Age of Onset  . Hypertension Father   . Lung cancer Father   . Diabetes Mother     Social History Social History   Tobacco Use  . Smoking status: Never Smoker  . Smokeless tobacco: Former Systems developer  . Tobacco comment: smoked one time    Substance Use Topics  . Alcohol use: Yes    Alcohol/week: 0.0 oz    Comment: 2 beers every other Friday and Saturday  . Drug use: No     Allergies   Sulfamethoxazole-trimethoprim; Metronidazole; and Penicillins   Review of Systems Review of Systems  Constitutional: Positive for fever (subjective). Negative for chills.  HENT: Positive for sore throat. Negative for ear pain and rhinorrhea.   Respiratory: Negative for shortness of breath.   Cardiovascular: Positive for chest pain (only with coughing). Negative for palpitations.  Gastrointestinal: Negative for abdominal pain, blood in stool, diarrhea, nausea and vomiting.  All other systems reviewed and are negative.   Physical Exam Updated Vital Signs BP 110/64 (BP Location: Left Arm)   Pulse (!) 110   Temp 98 F (36.7 C) (Oral)   Resp 20   SpO2 96%   Physical Exam  Constitutional: He appears ill (somewhat). No distress.  HENT:  Head: Normocephalic and atraumatic.  Right Ear: Tympanic membrane is not erythematous, not retracted and not bulging.  Left Ear: Tympanic membrane is not erythematous, not retracted and not bulging.  Nose: Mucosal edema present. Right sinus exhibits no maxillary sinus tenderness and no frontal sinus tenderness. Left sinus exhibits no maxillary sinus tenderness and no frontal sinus tenderness.  Mouth/Throat: Uvula is midline. No oropharyngeal exudate.  Eyes: Conjunctivae are normal. Right eye exhibits no discharge. Left eye exhibits no discharge.  Cardiovascular: Regular rhythm. Tachycardia present.  No murmur heard. HR on cardiac ranging from 95-110 while in the room  Pulmonary/Chest: No respiratory distress. He has wheezes (minimal diffue expiratory ). He has rhonchi. He has no rales.  Poor air movement throughout.   Abdominal: Soft. He exhibits no distension. There is no tenderness.  Lymphadenopathy:    He has no cervical adenopathy.  Neurological: He is alert.  Baseline contractures in  extremities x 4 with muscle wasting  Skin: Skin is warm and dry. No rash noted.  Psychiatric: He has a normal mood and affect. His behavior is normal.  Nursing note and vitals reviewed.   ED Treatments / Results  Labs Results for orders placed or performed during the hospital encounter of 05/07/17  CBC with Differential/Platelet  Result Value Ref Range   WBC 6.6 4.0 - 10.5 K/uL   RBC 4.63 4.22 - 5.81 MIL/uL   Hemoglobin 12.5 (L) 13.0 - 17.0 g/dL   HCT 37.6 (L) 39.0 - 52.0 %   MCV 81.2 78.0 - 100.0 fL   MCH 27.0 26.0 - 34.0 pg   MCHC 33.2 30.0 - 36.0 g/dL   RDW 12.7 11.5 - 15.5 %   Platelets 203 150 - 400 K/uL   Neutrophils Relative % 78 %   Neutro Abs 5.1 1.7 - 7.7 K/uL   Lymphocytes Relative 12 %   Lymphs Abs 0.8 0.7 - 4.0 K/uL   Monocytes Relative 9 %   Monocytes Absolute 0.6 0.1 - 1.0 K/uL   Eosinophils Relative 1 %   Eosinophils Absolute 0.1 0.0 - 0.7 K/uL   Basophils Relative 0 %   Basophils Absolute 0.0 0.0 - 0.1 K/uL  Comprehensive metabolic panel  Result Value Ref Range   Sodium 137 135 - 145 mmol/L   Potassium 3.7 3.5 - 5.1 mmol/L   Chloride 102 101 - 111 mmol/L   CO2 24 22 - 32 mmol/L   Glucose, Bld 118 (H) 65 - 99 mg/dL   BUN 13 6 - 20 mg/dL   Creatinine, Ser 0.65 0.61 - 1.24 mg/dL   Calcium 9.1 8.9 - 10.3 mg/dL   Total Protein 7.5 6.5 - 8.1 g/dL   Albumin 4.2 3.5 - 5.0 g/dL   AST 23 15 - 41 U/L   ALT 27 17 - 63 U/L   Alkaline Phosphatase 125 38 - 126 U/L   Total Bilirubin 0.5 0.3 - 1.2 mg/dL   GFR calc non Af Amer >60 >60 mL/min   GFR calc Af Amer >60 >60 mL/min   Anion gap 11 5 - 15  Urinalysis, Routine w reflex microscopic  Result Value Ref Range   Color, Urine STRAW (A) YELLOW   APPearance CLEAR CLEAR   Specific Gravity, Urine 1.006 1.005 - 1.030   pH 6.0 5.0 - 8.0   Glucose, UA NEGATIVE NEGATIVE mg/dL   Hgb urine dipstick NEGATIVE NEGATIVE   Bilirubin Urine NEGATIVE NEGATIVE   Ketones, ur NEGATIVE NEGATIVE mg/dL   Protein, ur NEGATIVE  NEGATIVE mg/dL  Nitrite NEGATIVE NEGATIVE   Leukocytes, UA NEGATIVE NEGATIVE  Brain natriuretic peptide  Result Value Ref Range   B Natriuretic Peptide 7.0 0.0 - 100.0 pg/mL  I-stat troponin, ED  Result Value Ref Range   Troponin i, poc 0.00 0.00 - 0.08 ng/mL   Comment 3          I-Stat CG4 Lactic Acid, ED  Result Value Ref Range   Lactic Acid, Venous 0.61 0.5 - 1.9 mmol/L   EKG  EKG Interpretation  Date/Time:  Friday May 07 2017 12:25:44 EST Ventricular Rate:  115 PR Interval:    QRS Duration: 94 QT Interval:  313 QTC Calculation: 433 R Axis:   119 Text Interpretation:  Sinus tachycardia Right axis deviation Borderline T wave abnormalities No significant change since last tracing Confirmed by Wandra Arthurs [phone]) on 05/07/2017 3:44:07 PM       Radiology Dg Chest 2 View  Result Date: 05/07/2017 CLINICAL DATA:  Productive cough and congestion x1 day. EXAM: CHEST  2 VIEW COMPARISON:  03/17/2017 FINDINGS: Lung volumes are low. Heart is borderline enlarged. No aortic aneurysm. Mild vascular congestion noted. Colonic interposition over the liver shadow seen as before with elevation of the right hemidiaphragm, chronic in appearance. No acute osseous abnormality. IMPRESSION: Low lung volumes are redemonstrated with crowding of interstitial lung markings and mild vascular congestion. No definite pneumonic consolidation. Electronically Signed   By: Ashley Royalty M.D.   On: 05/07/2017 13:45   Procedures Procedures (including critical care time)  Medications Ordered in ED Medications  sodium chloride 0.9 % bolus 1,000 mL (not administered)  oseltamivir (TAMIFLU) capsule 75 mg (not administered)  cefTRIAXone (ROCEPHIN) 1 g in sodium chloride 0.9 % 100 mL IVPB (not administered)  azithromycin (ZITHROMAX) 500 mg in sodium chloride 0.9 % 250 mL IVPB (not administered)  acetaminophen (TYLENOL) tablet 1,000 mg (not administered)    Initial Impression / Assessment and Plan / ED Course    I have reviewed the triage vital signs and the nursing notes.  Pertinent labs & imaging results that were available during my care of the patient were reviewed by me and considered in my medical decision making (see chart for details).   Patient presents via EMS for cough. Patient found to be tachycardic, tachypneic, and febrile to 101.3 with rectal temp. Given patient meets SIRS criteria work up initiated to include CXR, CBC, CMP, UA, lactic acid, blood cultures, and influenza panel. Additionally ordered EKG, troponin, and BNP given patient's hx. Will start fluids.   EKG without significant change from previous tracing. CXR with nonspecific findings. Lab work notable for lactic acid WNL, no leukocytosis. UA without signs of infection. Troponin and BNP both WNL. Anemia consistent with patient's baseline. No significant electrolyte abnormality.   Patient meets SIRS- suspicious for influenza vs. Atypical or early pneumonia. Will initiate treatment with tylenol, tamiflu, azithromycin, and ceftriaxone. Given presentation in combination of patient with hx of quadriplegia who lives at home alone will consult hospitalist service for admission. Findings and plan of care discussed with supervising physician Dr. Darl Householder who personally evaluated and examined this patient and is in agreement with plan.   16:04: CONSULT: Discussed case with hospitalist Dr. Shanon Brow who accepts admission.    Final Clinical Impressions(s) / ED Diagnoses   Final diagnoses:  SIRS (systemic inflammatory response syndrome) [facility])    ED Discharge Orders    None       Amaryllis Dyke, PA-C 05/07/17 1711    Drenda Freeze,  MD 05/08/17 3159

## 2017-05-07 NOTE — ED Notes (Signed)
ED Provider at bedside. YAO 

## 2017-05-07 NOTE — ED Notes (Signed)
cbg 162

## 2017-05-07 NOTE — H&P (Signed)
History and Physical    Thomas Ramos:[id number] DOB: 05/26/1970 DOA: 05/07/2017  PCP: Chesley Noon, MD  Patient coming from: Home  Chief Complaint: Fever and cough  HPI: Thomas Ramos is a 47 y.o. male with medical history significant of cerebral palsy, chronic diastolic congestive heart failure, diabetes lives at home with a caregiver comes in because of fever and cough.  Patient reports he has been running fever for about 2 days.  He denies any nasal congestion or any flulike symptoms.  Denies any rashes.  Patient is referred for admission thought to have flu versus pneumonia.  Review of Systems: As per HPI otherwise 10 point review of systems negative.   Past Medical History:  Diagnosis Date  . Anxiety   . Cerebral palsy ([place])   . Chronic diastolic (congestive) heart failure ([place])   . CP (cerebral palsy) ([place])   . Diabetes mellitus without complication (HCC)    borderline  . Environmental allergies    takes inhalers if needed  . Esophageal stricture   . GERD (gastroesophageal reflux disease)   . Hypertension   . Motility disorder, esophageal   . Pneumonia   . 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 S/P Botox injection    approx every 4 months  . Seasonal allergies     Past Surgical History:  Procedure Laterality Date  . BOTOX INJECTION N/A 12/21/2012   Procedure: BOTOX INJECTION;  Surgeon: Inda Castle, MD;  Location: [facility];  Service: Endoscopy;  Laterality: N/A;  . BOTOX INJECTION N/A 06/28/2014   Procedure: BOTOX INJECTION;  Surgeon: Inda Castle, MD;  Location: [place];  Service: Endoscopy;  Laterality: N/A;  . ESOPHAGOGASTRODUODENOSCOPY N/A 12/21/2012   Procedure: ESOPHAGOGASTRODUODENOSCOPY (EGD);  Surgeon: Inda Castle, MD;  Location: [facility];  Service: Endoscopy;  Laterality: N/A;  . ESOPHAGOGASTRODUODENOSCOPY N/A 06/28/2014   Procedure: ESOPHAGOGASTRODUODENOSCOPY (EGD);  Surgeon: Inda Castle, MD;  Location: [place];   Service: Endoscopy;  Laterality: N/A;  . ESOPHAGUS SURGERY     stretched esophagus  . legs    . MOUTH SURGERY    . TENDON RELEASE       reports that  has never smoked. He has quit using smokeless tobacco. He reports that he drinks alcohol. He reports that he does not use drugs.  Allergies  Allergen Reactions  . Sulfamethoxazole-Trimethoprim Nausea And Vomiting  . Metronidazole Nausea And Vomiting  . Penicillins Hives, Nausea And Vomiting and Other (See Comments)    Patient tolerated cefazolin in 2017 Has patient had a PCN reaction causing immediate rash, facial/tongue/throat swelling, SOB or lightheadedness with hypotension: Yes Has patient had a PCN reaction causing severe rash involving mucus membranes or skin necrosis: No Has patient had a PCN reaction that required hospitalization No Has patient had a PCN reaction occurring within the last 10 years: Yes If all of the above answers are "NO", then may proceed with Cephalosporin use.    Family History  Problem Relation Age of Onset  . Hypertension Father   . Lung cancer Father   . Diabetes Mother     Prior to Admission medications   Medication Sig Start Date End Date Taking? Authorizing Provider  albuterol (PROVENTIL HFA;VENTOLIN HFA) 108 (90 Base) MCG/ACT inhaler Inhale 2 puffs into the lungs every 6 (six) hours as needed for wheezing or shortness of breath. Use inhaler 3 times daily x 5 days, then every 6 hours as needed. Patient taking differently: Inhale 2 puffs into the  lungs every 6 (six) hours as needed for wheezing or shortness of breath.  08/18/16  Yes Eugenie Filler, MD  amLODipine (NORVASC) 10 MG tablet Take 10 mg by mouth daily.   Yes [provider]  budesonide-formoterol (SYMBICORT) 80-4.5 MCG/ACT inhaler Inhale 2 puffs into the lungs 2 (two) times daily.  04/21/17  Yes [provider]  fluticasone (FLONASE) 50 MCG/ACT nasal spray Place 1 spray into both nostrils daily as needed for rhinitis.  08/18/16  Yes Eugenie Filler, MD  furosemide (LASIX) 40 MG tablet Take 1 tablet (40 mg total) by mouth daily. 03/18/17 03/18/18 Yes Donne Hazel, MD  latanoprost (XALATAN) 0.005 % ophthalmic solution Place 1 drop into both eyes at bedtime.   Yes [provider]  metFORMIN (GLUCOPHAGE-XR) 500 MG 24 hr tablet Take 500 mg by mouth at bedtime. 08/06/16 08/06/17 Yes [provider]  montelukast (SINGULAIR) 10 MG tablet Take 10 mg by mouth every morning. 08/12/16  Yes [provider]  nitroGLYCERIN (NITROSTAT) 0.4 MG SL tablet Place 0.4 mg under the tongue every 5 (five) minutes as needed for chest pain.    Yes [provider]  ondansetron (ZOFRAN) 4 MG tablet Take 1 tablet (4 mg total) by mouth every 6 (six) hours. 02/05/17  Yes Pollina, Gwenyth Allegra, MD  potassium chloride (K-DUR) 10 MEQ tablet Take 10 mEq by mouth daily.   Yes [provider]  rosuvastatin (CRESTOR) 5 MG tablet Take 5 mg by mouth at bedtime.   Yes [provider]  timolol (TIMOPTIC) 0.5 % ophthalmic solution Place 1 drop into both eyes every 12 (twelve) hours. 07/31/16  Yes [provider]  valsartan (DIOVAN) 80 MG tablet Take 80 mg by mouth daily.   Yes [provider]  sucralfate (CARAFATE) 1 GM/10ML suspension Take 10 mLs (1 g total) by mouth 4 (four) times daily -  with meals and at bedtime. Patient not taking: Reported on 09/20/2015 08/11/15 09/24/15  Veatrice Kells, MD    Physical Exam: Vitals:   05/07/17 1441 05/07/17 1500 05/07/17 1520 05/07/17 1530  BP: 107/60 (!) 108/53 (!) 116/93 116/64  Pulse: (!) 106 (!) 101 85 (!) 108  Resp: (!) 26 (!) 25 20 (!) 29  Temp:      TempSrc:      SpO2: 94% 92% 95% 93%      Constitutional: NAD, calm, comfortable not toxic appearing Vitals:   05/07/17 1441 05/07/17 1500 05/07/17 1520 05/07/17 1530  BP: 107/60 (!) 108/53 (!) 116/93 116/64  Pulse: (!) 106 (!) 101 85 (!) 108  Resp: (!) 26 (!) 25 20 (!) 29  Temp:        TempSrc:      SpO2: 94% 92% 95% 93%   Eyes: PERRL, lids and conjunctivae normal ENMT: Mucous membranes are moist. Posterior pharynx clear of any exudate or lesions.Normal dentition.  Neck: normal, supple, no masses, no thyromegaly Respiratory: clear to auscultation bilaterally, no wheezing, no crackles. Normal respiratory effort. No accessory muscle use.  Cardiovascular: Regular rate and rhythm, no murmurs / rubs / gallops. No extremity edema. 2+ pedal pulses. No carotid bruits.  Abdomen: no tenderness, no masses palpated. No hepatosplenomegaly. Bowel sounds positive.  Musculoskeletal: Contracted in all 4  skin: no rashes, lesions, ulcers. No induration Neurologic: CN 2-12 grossly intact.  Quadriplegic  psychiatric: Normal judgment and insight. Alert and oriented x 3. Normal mood.    Labs on Admission: I have personally reviewed following labs and imaging studies  CBC:  Recent Labs  Lab 05/07/17 1354  WBC 6.6  NEUTROABS 5.1  HGB 12.5*  HCT 37.6*  MCV 81.2  PLT 037   Basic Metabolic Panel: Recent Labs  Lab 05/07/17 1354  NA 137  K 3.7  CL 102  CO2 24  GLUCOSE 118*  BUN 13  CREATININE 0.65  CALCIUM 9.1   GFR: CrCl cannot be calculated (Unknown ideal weight.). Liver Function Tests: Recent Labs  Lab 05/07/17 1354  AST 23  ALT 27  ALKPHOS 125  BILITOT 0.5  PROT 7.5  ALBUMIN 4.2   No results for input(s): LIPASE, AMYLASE in the last 168 hours. No results for input(s): AMMONIA in the last 168 hours. Coagulation Profile: No results for input(s): INR, PROTIME in the last 168 hours. Cardiac Enzymes: No results for input(s): CKTOTAL, CKMB, CKMBINDEX, TROPONINI in the last 168 hours. BNP (last 3 results) No results for input(s): PROBNP in the last 8760 hours. HbA1C: No results for input(s): HGBA1C in the last 72 hours. CBG: No results for input(s): GLUCAP in the last 168 hours. Lipid Profile: No results for input(s): CHOL, HDL, LDLCALC, TRIG, CHOLHDL, LDLDIRECT  in the last 72 hours. Thyroid Function Tests: No results for input(s): TSH, T4TOTAL, FREET4, T3FREE, THYROIDAB in the last 72 hours. Anemia Panel: No results for input(s): VITAMINB12, FOLATE, FERRITIN, TIBC, IRON, RETICCTPCT in the last 72 hours. Urine analysis:    Component Value Date/Time   COLORURINE STRAW (A) 05/07/2017 1403   APPEARANCEUR CLEAR 05/07/2017 1403   LABSPEC 1.006 05/07/2017 1403   PHURINE 6.0 05/07/2017 1403   GLUCOSEU NEGATIVE 05/07/2017 1403   HGBUR NEGATIVE 05/07/2017 1403   BILIRUBINUR NEGATIVE 05/07/2017 1403   KETONESUR NEGATIVE 05/07/2017 1403   PROTEINUR NEGATIVE 05/07/2017 1403   UROBILINOGEN 1.0 05/26/2014 2211   NITRITE NEGATIVE 05/07/2017 1403   LEUKOCYTESUR NEGATIVE 05/07/2017 1403   Sepsis Labs: !!!!!!!!!!!!!!!!!!!!!!!!!!!!!!!!!!!!!!!!!!!! @LABRCNTIP (procalcitonin:4,lacticidven:4) )No results found for this or any previous visit (from the past 240 hour(s)).   Radiological Exams on Admission: Dg Chest 2 View  Result Date: 05/07/2017 CLINICAL DATA:  Productive cough and congestion x1 day. EXAM: CHEST  2 VIEW COMPARISON:  03/17/2017 FINDINGS: Lung volumes are low. Heart is borderline enlarged. No aortic aneurysm. Mild vascular congestion noted. Colonic interposition over the liver shadow seen as before with elevation of the right hemidiaphragm, chronic in appearance. No acute osseous abnormality. IMPRESSION: Low lung volumes are redemonstrated with crowding of interstitial lung markings and mild vascular congestion. No definite pneumonic consolidation. Electronically Signed   By: Ashley Royalty M.D.   On: 05/07/2017 13:45    Old chart reviewed Case discussed with EDP Chest x-ray reviewed no overt edema or infiltrate noted  Assessment/Plan 46 year old male quadriplegic comes in with fever and cough for 2 days with possible influenza versus pneumonia  Principal Problem:   Fever-source unclear but is having a lot of respiratory complaints.  Influenza  panel is pending.  Will cover for healthcare associated pneumonia and influenza at this time.  He is penicillin allergic placed on Azactam and vancomycin along with Tamiflu.  Blood cultures and sputum cultures are pending.  Active Problems: History of dysphagia -noted   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HCC)-stable   Chronic diastolic (congestive) heart failure (HCC)-currently compensated and stable at this time     DVT prophylaxis: SCDs Code Status: Full Family Communication: None Disposition Plan: Per day team Consults called: None Admission status: Admission   Amaris Delafuente A MD [facility]  If 7PM-7AM, please contact night-coverage [email or web address] Password [facility]  05/07/2017,  4:55 PM

## 2017-05-07 NOTE — ED Notes (Signed)
Pt has been given regular coke for fluids at this time.

## 2017-05-08 ENCOUNTER — Other Ambulatory Visit: Payer: Self-pay

## 2017-05-08 DIAGNOSIS — G809 Cerebral palsy, unspecified: Secondary | ICD-10-CM

## 2017-05-08 DIAGNOSIS — I5032 Chronic diastolic (congestive) heart failure: Secondary | ICD-10-CM

## 2017-05-08 DIAGNOSIS — R509 Fever, unspecified: Secondary | ICD-10-CM

## 2017-05-08 LAB — CBC WITH DIFFERENTIAL/PLATELET
BASOS ABS: 0 10*3/uL (ref 0.0–0.1)
Basophils Relative: 0 %
Eosinophils Absolute: 0.1 10*3/uL (ref 0.0–0.7)
Eosinophils Relative: 3 %
HEMATOCRIT: 33.8 % — AB (ref 39.0–52.0)
HEMOGLOBIN: 11.1 g/dL — AB (ref 13.0–17.0)
LYMPHS PCT: 26 %
Lymphs Abs: 1 10*3/uL (ref 0.7–4.0)
MCH: 27.1 pg (ref 26.0–34.0)
MCHC: 32.8 g/dL (ref 30.0–36.0)
MCV: 82.4 fL (ref 78.0–100.0)
Monocytes Absolute: 0.4 10*3/uL (ref 0.1–1.0)
Monocytes Relative: 11 %
NEUTROS ABS: 2.4 10*3/uL (ref 1.7–7.7)
NEUTROS PCT: 60 %
Platelets: 166 10*3/uL (ref 150–400)
RBC: 4.1 MIL/uL — ABNORMAL LOW (ref 4.22–5.81)
RDW: 13 % (ref 11.5–15.5)
WBC: 3.9 10*3/uL — ABNORMAL LOW (ref 4.0–10.5)

## 2017-05-08 LAB — BASIC METABOLIC PANEL
ANION GAP: 12 (ref 5–15)
BUN: 9 mg/dL (ref 6–20)
CO2: 21 mmol/L — AB (ref 22–32)
Calcium: 8.6 mg/dL — ABNORMAL LOW (ref 8.9–10.3)
Chloride: 104 mmol/L (ref 101–111)
Creatinine, Ser: 0.57 mg/dL — ABNORMAL LOW (ref 0.61–1.24)
GFR calc Af Amer: >60 mL/min (ref >60)
Glucose, Bld: 108 mg/dL — ABNORMAL HIGH (ref 65–99)
POTASSIUM: 3.7 mmol/L (ref 3.5–5.1)
Sodium: 137 mmol/L (ref 135–145)

## 2017-05-08 LAB — STREP PNEUMONIAE URINARY ANTIGEN: STREP PNEUMO URINARY ANTIGEN: NEGATIVE

## 2017-05-08 LAB — GLUCOSE, CAPILLARY
Glucose-Capillary: 97 mg/dL (ref 65–99)
Glucose-Capillary: 138 mg/dL — ABNORMAL HIGH (ref 65–99)
Glucose-Capillary: 148 mg/dL — ABNORMAL HIGH (ref 65–99)
Glucose-Capillary: 135 mg/dL — ABNORMAL HIGH (ref 65–99)

## 2017-05-08 LAB — MRSA PCR SCREENING: MRSA BY PCR: NEGATIVE

## 2017-05-08 MED ORDER — AZITHROMYCIN 250 MG PO TABS
500.0000 mg | ORAL_TABLET | Freq: Every day | ORAL | Status: DC
  Administered 2017-05-08: 500 mg via ORAL
  Filled 2017-05-08: qty 2

## 2017-05-08 MED ORDER — IPRATROPIUM-ALBUTEROL 0.5-2.5 (3) MG/3ML IN SOLN
3.0000 mL | Freq: Four times a day (QID) | RESPIRATORY_TRACT | Status: DC
  Administered 2017-05-08 (×2): 3 mL via RESPIRATORY_TRACT
  Filled 2017-05-08 (×2): qty 3

## 2017-05-08 MED ORDER — SODIUM CHLORIDE 0.9 % IV SOLN
500.0000 mg | INTRAVENOUS | Status: DC
  Filled 2017-05-08: qty 500

## 2017-05-08 MED ORDER — IPRATROPIUM-ALBUTEROL 0.5-2.5 (3) MG/3ML IN SOLN
3.0000 mL | Freq: Two times a day (BID) | RESPIRATORY_TRACT | Status: DC
  Administered 2017-05-09: 3 mL via RESPIRATORY_TRACT
  Filled 2017-05-08: qty 3

## 2017-05-08 NOTE — Progress Notes (Signed)
[facility]  PROGRESS NOTE  Thomas Ramos TIR:[id number] DOB: 1970/07/12 DOA: 05/07/2017 PCP: Chesley Noon, MD   Brief HPI:    47 y.o. male with medical history significant of cerebral palsy, chronic diastolic congestive heart failure, diabetes lives at home with a caregiver comes in because of fever and cough.  Patient reports he has been running fever for about 2 days.  He denies any nasal congestion or any flulike symptoms.  Denies any rashes.  Patient is referred for admission thought to have flu versus pneumonia.    Subjective   Patient seen and examined, complains of mild shortness of breath, coughing up phlegm   Assessment/Plan:     1. Fever-influenza panel is negative, will discontinue Tamiflu. Patient currently on Azactam, vancomycin for possible healthcare associated pneumonia. Follow blood culture results. Start albuterol/Atrovent nebulizer Q6 hours 2. Congenital cerebral palsy -quadriplegia, stable 3. Chronic diastolic heart failure- currently compensated    DVT prophylaxis: SCD's  Code Status: Full code  Family Communication: No family at bedside  Disposition Plan: likely home when medically ready for discharge   Consultants:  none  Procedures:     Antibiotics:   Anti-infectives (From admission, onward)   Start     Dose/Rate Route Frequency Ordered Stop   05/08/17 1600  azithromycin (ZITHROMAX) 500 mg in sodium chloride 0.9 % 250 mL IVPB     500 mg 250 mL/hr over 60 Minutes Intravenous Every 24 hours 05/08/17 0911     05/08/17 1000  vancomycin (VANCOCIN) 1,250 mg in sodium chloride 0.9 % 250 mL IVPB     1,250 mg 166.7 mL/hr over 90 Minutes Intravenous Every 12 hours 05/07/17 1919     05/07/17 2200  oseltamivir (TAMIFLU) capsule 75 mg  Status:  Discontinued     75 mg Oral 2 times daily 05/07/17 1658 05/08/17 0910   05/07/17 2200  ceFEPIme (MAXIPIME) 1 g in sodium chloride 0.9 % 100 mL IVPB     1 g 200 mL/hr over 30 Minutes Intravenous  Every 8 hours 05/07/17 1710 05/15/17 2159   05/07/17 1715  vancomycin (VANCOCIN) 1,500 mg in sodium chloride 0.9 % 500 mL IVPB     1,500 mg 250 mL/hr over 120 Minutes Intravenous  Once 05/07/17 1702 05/07/17 2016   05/07/17 1700  aztreonam (AZACTAM) 2 g in sodium chloride 0.9 % 100 mL IVPB  Status:  Discontinued     2 g 200 mL/hr over 30 Minutes Intravenous Every 8 hours 05/07/17 1658 05/07/17 1709   05/07/17 1545  oseltamivir (TAMIFLU) capsule 75 mg     75 mg Oral  Once 05/07/17 1532 05/07/17 1806   05/07/17 1545  cefTRIAXone (ROCEPHIN) 1 g in sodium chloride 0.9 % 100 mL IVPB     1 g 200 mL/hr over 30 Minutes Intravenous  Once 05/07/17 1533 05/07/17 1623   05/07/17 1545  azithromycin (ZITHROMAX) 500 mg in sodium chloride 0.9 % 250 mL IVPB     500 mg 250 mL/hr over 60 Minutes Intravenous  Once 05/07/17 1533 05/07/17 1744       Objective   Vitals:   05/07/17 2000 05/07/17 2014 05/08/17 0555 05/08/17 0915  BP:  (!) 112/58 134/67 125/63  Pulse:  72 92 99  Resp:  20 18 20   Temp:  98.4 F (36.9 C) 97.8 F (36.6 C) 98.8 F (37.1 C)  TempSrc:  Oral Oral Oral  SpO2:  100% 100% 96%  Weight: 75.8 kg (167 lb 1.6 oz)  Height: 5\' 5"  (1.651 m)       Intake/Output Summary (Last 24 hours) at 05/08/2017 1137 Last data filed at 05/08/2017 1052 Gross per 24 hour  Intake 1790 ml  Output 1576 ml  Net 214 ml   Filed Weights   05/07/17 2000  Weight: 75.8 kg (167 lb 1.6 oz)     Physical Examination:  Physical Exam: Eyes: No icterus, extraocular muscles intact  Mouth: Oral mucosa is moist, no lesions on palate,  Neck: Supple, no deformities, masses, or tenderness Lungs: Normal respiratory effort, bilateral rhonchi Heart: Regular rate and rhythm, S1 and S2 normal, no murmurs, rubs auscultated Abdomen: BS normoactive,soft,nondistended,non-tender to palpation,no organomegaly Extremities: No pretibial edema, no erythema, no cyanosis, no clubbing Neuro : Alert and oriented to time,  place and person, quadriplegic  Skin: No rashes seen on exam       Data Reviewed: I have personally reviewed following labs and imaging studies  CBG: Recent Labs  Lab 05/07/17 1811 05/07/17 1957 05/08/17 0742 05/08/17 1129  GLUCAP 162* 131* 97 138*    CBC: Recent Labs  Lab 05/07/17 1354 05/08/17 0632  WBC 6.6 3.9*  NEUTROABS 5.1 2.4  HGB 12.5* 11.1*  HCT 37.6* 33.8*  MCV 81.2 82.4  PLT 203 509    Basic Metabolic Panel: Recent Labs  Lab 05/07/17 1354 05/08/17 0632  NA 137 137  K 3.7 3.7  CL 102 104  CO2 24 21*  GLUCOSE 118* 108*  BUN 13 9  CREATININE 0.65 0.57*  CALCIUM 9.1 8.6*    No results found for this or any previous visit (from the past 240 hour(s)).   Liver Function Tests: Recent Labs  Lab 05/07/17 1354  AST 23  ALT 27  ALKPHOS 125  BILITOT 0.5  PROT 7.5  ALBUMIN 4.2   No results for input(s): LIPASE, AMYLASE in the last 168 hours. No results for input(s): AMMONIA in the last 168 hours.  Cardiac Enzymes: No results for input(s): CKTOTAL, CKMB, CKMBINDEX, TROPONINI in the last 168 hours. BNP (last 3 results) Recent Labs    03/14/17 0345 03/16/17 0223 05/07/17 1355  BNP 21.6 275.7* 7.0    ProBNP (last 3 results) No results for input(s): PROBNP in the last 8760 hours.    Studies: Dg Chest 2 View  Result Date: 05/07/2017 CLINICAL DATA:  Productive cough and congestion x1 day. EXAM: CHEST  2 VIEW COMPARISON:  03/17/2017 FINDINGS: Lung volumes are low. Heart is borderline enlarged. No aortic aneurysm. Mild vascular congestion noted. Colonic interposition over the liver shadow seen as before with elevation of the right hemidiaphragm, chronic in appearance. No acute osseous abnormality. IMPRESSION: Low lung volumes are redemonstrated with crowding of interstitial lung markings and mild vascular congestion. No definite pneumonic consolidation. Electronically Signed   By: Ashley Royalty M.D.   On: 05/07/2017 13:45    Scheduled Meds: .  amLODipine  10 mg Oral Daily  . furosemide  40 mg Oral Daily  . insulin aspart  0-9 Units Subcutaneous TID WC  . ipratropium-albuterol  3 mL Nebulization Q6H  . latanoprost  1 drop Both Eyes QHS  . montelukast  10 mg Oral q morning - 10a  . potassium chloride  10 mEq Oral Daily  . rosuvastatin  5 mg Oral QHS  . sodium chloride flush  3 mL Intravenous Q12H  . sucralfate  1 g Oral TID WC & HS  . timolol  1 drop Both Eyes Q12H      Time spent: 25  min  [facility] Pager [phone]. If 7PM-7AM, please contact night-coverage at [email or web address], Office  [phone]  password [facility]  05/08/2017, 11:37 AM  LOS: 1 day

## 2017-05-08 NOTE — Progress Notes (Signed)
PHARMACIST - PHYSICIAN COMMUNICATION  CONCERNING: Antibiotic IV to Oral Route Change Policy  RECOMMENDATION: This patient is receiving azithromycin by the intravenous route.  Based on criteria approved by the Pharmacy and Therapeutics Committee, the antibiotic(s) is/are being converted to the equivalent oral dose form(s).   DESCRIPTION: These criteria include:  Patient being treated for a respiratory tract infection, urinary tract infection, cellulitis or clostridium difficile associated diarrhea if on metronidazole  The patient is not neutropenic and does not exhibit a GI malabsorption state  The patient is eating (either orally or via tube) and/or has been taking other orally administered medications for a least 24 hours  The patient is improving clinically and has a Tmax < 100.5  If you have questions about this conversion, please contact the Pharmacy Department  []   [phone] )  [facility] []   [phone] )  [facility] []   [phone] )  [facility] []   [phone] )  [facility] [x]   [phone] )  [place], [place].D. [phone] 05/08/2017 1:28 PM

## 2017-05-09 DIAGNOSIS — R6510 Systemic inflammatory response syndrome (SIRS) of non-infectious origin without acute organ dysfunction: Secondary | ICD-10-CM

## 2017-05-09 LAB — CBC
HEMATOCRIT: 35.9 % — AB (ref 39.0–52.0)
Hemoglobin: 11.7 g/dL — ABNORMAL LOW (ref 13.0–17.0)
MCH: 26.8 pg (ref 26.0–34.0)
MCHC: 32.6 g/dL (ref 30.0–36.0)
MCV: 82.3 fL (ref 78.0–100.0)
Platelets: 166 10*3/uL (ref 150–400)
RBC: 4.36 MIL/uL (ref 4.22–5.81)
RDW: 13 % (ref 11.5–15.5)
WBC: 3.5 10*3/uL — ABNORMAL LOW (ref 4.0–10.5)

## 2017-05-09 LAB — BASIC METABOLIC PANEL
Anion gap: 12 (ref 5–15)
BUN: 11 mg/dL (ref 6–20)
CALCIUM: 8.7 mg/dL — AB (ref 8.9–10.3)
CO2: 22 mmol/L (ref 22–32)
CREATININE: 0.63 mg/dL (ref 0.61–1.24)
Chloride: 104 mmol/L (ref 101–111)
GFR calc non Af Amer: >60 mL/min (ref >60)
GLUCOSE: 115 mg/dL — AB (ref 65–99)
Potassium: 3.4 mmol/L — ABNORMAL LOW (ref 3.5–5.1)
Sodium: 138 mmol/L (ref 135–145)

## 2017-05-09 LAB — GLUCOSE, CAPILLARY
Glucose-Capillary: 113 mg/dL — ABNORMAL HIGH (ref 65–99)
Glucose-Capillary: 114 mg/dL — ABNORMAL HIGH (ref 65–99)

## 2017-05-09 MED ORDER — LEVOFLOXACIN 750 MG PO TABS: 750.0000 mg | ORAL_TABLET | Freq: Every day | ORAL | 0 refills | Status: AC

## 2017-05-09 MED ORDER — POTASSIUM CHLORIDE CRYS ER 20 MEQ PO TBCR
20.0000 meq | EXTENDED_RELEASE_TABLET | Freq: Once | ORAL | Status: AC
  Administered 2017-05-09: 20 meq via ORAL

## 2017-05-09 NOTE — Progress Notes (Signed)
Thomas Ramos to be D/C'd Home per MD order.  Discussed prescriptions and follow up appointments with the patient. Prescriptions given to patient, medication list explained in detail. Pt verbalized understanding.  Allergies as of 05/09/2017      Reactions   Sulfamethoxazole-trimethoprim Nausea And Vomiting   Metronidazole Nausea And Vomiting   Penicillins Hives, Nausea And Vomiting, Other (See Comments)   Patient tolerated cefazolin in 2017 Has patient had a PCN reaction causing immediate rash, facial/tongue/throat swelling, SOB or lightheadedness with hypotension: Yes Has patient had a PCN reaction causing severe rash involving mucus membranes or skin necrosis: No Has patient had a PCN reaction that required hospitalization No Has patient had a PCN reaction occurring within the last 10 years: Yes If all of the above answers are "NO", then may proceed with Cephalosporin use.      Medication List    STOP taking these medications   predniSONE 5 MG tablet Commonly known as:  DELTASONE     TAKE these medications   albuterol 108 (90 Base) MCG/ACT inhaler Commonly known as:  PROVENTIL HFA;VENTOLIN HFA Inhale 2 puffs into the lungs every 6 (six) hours as needed for wheezing or shortness of breath. Use inhaler 3 times daily x 5 days, then every 6 hours as needed. What changed:  additional instructions   amLODipine 10 MG tablet Commonly known as:  NORVASC Take 10 mg by mouth daily.   budesonide-formoterol 80-4.5 MCG/ACT inhaler Commonly known as:  SYMBICORT Inhale 2 puffs into the lungs 2 (two) times daily.   fluticasone 50 MCG/ACT nasal spray Commonly known as:  FLONASE Place 1 spray into both nostrils daily as needed for rhinitis.   furosemide 40 MG tablet Commonly known as:  LASIX Take 1 tablet (40 mg total) by mouth daily.   latanoprost 0.005 % ophthalmic solution Commonly known as:  XALATAN Place 1 drop into both eyes at bedtime.   levofloxacin 750 MG tablet Commonly known  as:  LEVAQUIN Take 1 tablet (750 mg total) by mouth daily for 5 days.   metFORMIN 500 MG 24 hr tablet Commonly known as:  GLUCOPHAGE-XR Take 500 mg by mouth at bedtime.   montelukast 10 MG tablet Commonly known as:  SINGULAIR Take 10 mg by mouth every morning.   nitroGLYCERIN 0.4 MG SL tablet Commonly known as:  NITROSTAT Place 0.4 mg under the tongue every 5 (five) minutes as needed for chest pain.   ondansetron 4 MG tablet Commonly known as:  ZOFRAN Take 1 tablet (4 mg total) by mouth every 6 (six) hours.   potassium chloride 10 MEQ tablet Commonly known as:  K-DUR Take 10 mEq by mouth daily.   rosuvastatin 5 MG tablet Commonly known as:  CRESTOR Take 5 mg by mouth at bedtime.   timolol 0.5 % ophthalmic solution Commonly known as:  TIMOPTIC Place 1 drop into both eyes every 12 (twelve) hours.   valsartan 80 MG tablet Commonly known as:  DIOVAN Take 80 mg by mouth daily.       Vitals:   05/09/17 0749 05/09/17 1418  BP:  136/81  Pulse:  (!) 102  Resp:  20  Temp:  98.9 F (37.2 C)  SpO2: 95% 98%    Skin clean, dry and intact without evidence of skin break down, no evidence of skin tears noted. IV catheter discontinued intact. Site without signs and symptoms of complications. Dressing and pressure applied. Pt denies pain at this time. No complaints noted.  An After Visit Summary  was printed and given to the patient. Patient escorted via Biomedical scientist and D/C home via [place], RN [employer] Phone [phone]

## 2017-05-09 NOTE — Discharge Summary (Addendum)
Physician Discharge Summary  Thomas Ramos YJE:[id number] DOB: Jul 24, 1970 DOA: 05/07/2017  PCP: Chesley Noon, MD  Admit date: 05/07/2017 Discharge date: 05/09/2017  Time spent: 30* minutes  Recommendations for Outpatient Follow-up:  1. Follow up PCP in 2 weeks   Discharge Diagnoses:  Principal Problem:   Fever Active Problems:   Dysphagia   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HCC)   Chronic diastolic (congestive) heart failure (HCC)   Discharge Condition: Stable  Diet recommendation: Heart healthy diet  Filed Weights   05/07/17 2000  Weight: 75.8 kg (167 lb 1.6 oz)    History of present illness:  47 y.o.malewith medical history significant ofcerebral palsy, chronic diastolic congestive heart failure, diabetes lives at home with a caregiver comes in because of fever and cough. Patient reports he has been running fever for about 2 days. He denies any nasal congestion or any flulike symptoms. Denies any rashes. Patient is referred for admission thought to have flu versus pneumonia.    Hospital Course:   1. Fever- resolved, influenza panel is negative,  Tamiflu was discontinued. Patient was started on  Azactam, vancomycin for possible healthcare associated pneumonia. Blood culture results are negative to date.  He is back to baseline and wants to go home. Will discharge home on Po Levaquin 750 mg po daily for five more days. 2. Congenital cerebral palsy -quadriplegia, stable 3. Chronic diastolic heart failure- currently compensated 4. Hypokalemia- potassium is 3.4, will give one dose of Kdur 20 meq po before discharge.     Procedures:  None   Consultations:  None   Discharge Exam: Vitals:   05/09/17 0535 05/09/17 0749  BP: (!) 102/58   Pulse: 91   Resp: 20   Temp: 98.2 F (36.8 C)   SpO2: 95% 95%    General: Appears in no acute distress Cardiovascular: S1S2 RRR Respiratory: Clear bilaterally  Discharge Instructions   Discharge Instructions    Diet  - low sodium heart healthy   Complete by:  As directed    Increase activity slowly   Complete by:  As directed      Allergies as of 05/09/2017      Reactions   Sulfamethoxazole-trimethoprim Nausea And Vomiting   Metronidazole Nausea And Vomiting   Penicillins Hives, Nausea And Vomiting, Other (See Comments)   Patient tolerated cefazolin in 2017 Has patient had a PCN reaction causing immediate rash, facial/tongue/throat swelling, SOB or lightheadedness with hypotension: Yes Has patient had a PCN reaction causing severe rash involving mucus membranes or skin necrosis: No Has patient had a PCN reaction that required hospitalization No Has patient had a PCN reaction occurring within the last 10 years: Yes If all of the above answers are "NO", then may proceed with Cephalosporin use.      Medication List    STOP taking these medications   predniSONE 5 MG tablet Commonly known as:  DELTASONE     TAKE these medications   albuterol 108 (90 Base) MCG/ACT inhaler Commonly known as:  PROVENTIL HFA;VENTOLIN HFA Inhale 2 puffs into the lungs every 6 (six) hours as needed for wheezing or shortness of breath. Use inhaler 3 times daily x 5 days, then every 6 hours as needed. What changed:  additional instructions   amLODipine 10 MG tablet Commonly known as:  NORVASC Take 10 mg by mouth daily.   budesonide-formoterol 80-4.5 MCG/ACT inhaler Commonly known as:  SYMBICORT Inhale 2 puffs into the lungs 2 (two) times daily.   fluticasone 50 MCG/ACT nasal spray Commonly  known as:  FLONASE Place 1 spray into both nostrils daily as needed for rhinitis.   furosemide 40 MG tablet Commonly known as:  LASIX Take 1 tablet (40 mg total) by mouth daily.   latanoprost 0.005 % ophthalmic solution Commonly known as:  XALATAN Place 1 drop into both eyes at bedtime.   levofloxacin 750 MG tablet Commonly known as:  LEVAQUIN Take 1 tablet (750 mg total) by mouth daily for 5 days.   metFORMIN 500 MG  24 hr tablet Commonly known as:  GLUCOPHAGE-XR Take 500 mg by mouth at bedtime.   montelukast 10 MG tablet Commonly known as:  SINGULAIR Take 10 mg by mouth every morning.   nitroGLYCERIN 0.4 MG SL tablet Commonly known as:  NITROSTAT Place 0.4 mg under the tongue every 5 (five) minutes as needed for chest pain.   ondansetron 4 MG tablet Commonly known as:  ZOFRAN Take 1 tablet (4 mg total) by mouth every 6 (six) hours.   potassium chloride 10 MEQ tablet Commonly known as:  K-DUR Take 10 mEq by mouth daily.   rosuvastatin 5 MG tablet Commonly known as:  CRESTOR Take 5 mg by mouth at bedtime.   timolol 0.5 % ophthalmic solution Commonly known as:  TIMOPTIC Place 1 drop into both eyes every 12 (twelve) hours.   valsartan 80 MG tablet Commonly known as:  DIOVAN Take 80 mg by mouth daily.      Allergies  Allergen Reactions  . Sulfamethoxazole-Trimethoprim Nausea And Vomiting  . Metronidazole Nausea And Vomiting  . Penicillins Hives, Nausea And Vomiting and Other (See Comments)    Patient tolerated cefazolin in 2017 Has patient had a PCN reaction causing immediate rash, facial/tongue/throat swelling, SOB or lightheadedness with hypotension: Yes Has patient had a PCN reaction causing severe rash involving mucus membranes or skin necrosis: No Has patient had a PCN reaction that required hospitalization No Has patient had a PCN reaction occurring within the last 10 years: Yes If all of the above answers are "NO", then may proceed with Cephalosporin use.      The results of significant diagnostics from this hospitalization (including imaging, microbiology, ancillary and laboratory) are listed below for reference.    Significant Diagnostic Studies: Dg Chest 2 View  Result Date: 05/07/2017 CLINICAL DATA:  Productive cough and congestion x1 day. EXAM: CHEST  2 VIEW COMPARISON:  03/17/2017 FINDINGS: Lung volumes are low. Heart is borderline enlarged. No aortic aneurysm. Mild  vascular congestion noted. Colonic interposition over the liver shadow seen as before with elevation of the right hemidiaphragm, chronic in appearance. No acute osseous abnormality. IMPRESSION: Low lung volumes are redemonstrated with crowding of interstitial lung markings and mild vascular congestion. No definite pneumonic consolidation. Electronically Signed   By: Ashley Royalty M.D.   On: 05/07/2017 13:45    Microbiology: Recent Results (from the past 240 hour(s))  Blood culture (routine x 2)     Status: None (Preliminary result)   Collection Time: 05/07/17  1:55 PM  Result Value Ref Range Status   Specimen Description   Final    BLOOD LEFT HAND Performed at [facility], [place] [address], [place], [place] [address]    Special Requests   Final    BOTTLES DRAWN AEROBIC AND ANAEROBIC Blood Culture adequate volume Performed at [facility] [address], [place], [place] [address]    Culture   Final    NO GROWTH < 24 HOURS Performed at [facility], [address] [place], [place] [address]    Report Status PENDING  Incomplete  Blood culture (routine x 2)     Status: None (Preliminary result)   Collection Time: 05/07/17  2:10 PM  Result Value Ref Range Status   Specimen Description   Final    BLOOD LEFT ANTECUBITAL Performed at [facility] [address] [place], [place] [address]    Special Requests   Final    BOTTLES DRAWN AEROBIC AND ANAEROBIC Blood Culture adequate volume Performed at [facility] [address] [place], [place] [address]    Culture   Final    NO GROWTH < 24 HOURS Performed at [facility] [address], [place], [place] [address]    Report Status PENDING  Incomplete  MRSA PCR Screening     Status: None   Collection Time: 05/08/17  1:27 PM  Result Value Ref Range Status   MRSA by PCR NEGATIVE NEGATIVE Final    Comment:        The GeneXpert MRSA Assay  (FDA approved for NASAL specimens only), is one component of a comprehensive MRSA colonization surveillance program. It is not intended to diagnose MRSA infection nor to guide or monitor treatment for MRSA infections. Performed at [facility], [place] [address], [place],  [address]      Labs: Basic Metabolic Panel: Recent Labs  Lab 05/07/17 1354 05/08/17 0632 05/09/17 0629  NA 137 137 138  K 3.7 3.7 3.4*  CL 102 104 104  CO2 24 21* 22  GLUCOSE 118* 108* 115*  BUN 13 9 11   CREATININE 0.65 0.57* 0.63  CALCIUM 9.1 8.6* 8.7*   Liver Function Tests: Recent Labs  Lab 05/07/17 1354  AST 23  ALT 27  ALKPHOS 125  BILITOT 0.5  PROT 7.5  ALBUMIN 4.2   No results for input(s): LIPASE, AMYLASE in the last 168 hours. No results for input(s): AMMONIA in the last 168 hours. CBC: Recent Labs  Lab 05/07/17 1354 05/08/17 0632 05/09/17 0629  WBC 6.6 3.9* 3.5*  NEUTROABS 5.1 2.4  --   HGB 12.5* 11.1* 11.7*  HCT 37.6* 33.8* 35.9*  MCV 81.2 82.4 82.3  PLT 203 166 166   Cardiac Enzymes: No results for input(s): CKTOTAL, CKMB, CKMBINDEX, TROPONINI in the last 168 hours. BNP: BNP (last 3 results) Recent Labs    03/14/17 0345 03/16/17 0223 05/07/17 1355  BNP 21.6 275.7* 7.0    ProBNP (last 3 results) No results for input(s): PROBNP in the last 8760 hours.  CBG: Recent Labs  Lab 05/08/17 0742 05/08/17 1129 05/08/17 1714 05/08/17 2059 05/09/17 0735  GLUCAP 97 138* 148* 135* 113*       Signed:  Oswald Hillock MD.  [facility] 05/09/2017, 11:34 AM

## 2017-05-12 LAB — CULTURE, BLOOD (ROUTINE X 2)
Culture: NO GROWTH
Culture: NO GROWTH
SPECIAL REQUESTS: ADEQUATE
Special Requests: ADEQUATE

## 2017-05-21 ENCOUNTER — Telehealth: Payer: Self-pay | Admitting: Pulmonary Disease

## 2017-05-21 DIAGNOSIS — G4733 Obstructive sleep apnea (adult) (pediatric): Secondary | ICD-10-CM

## 2017-05-21 NOTE — Telephone Encounter (Signed)
Pt calling to schedule sleep study.  It does not appear that a sleep study was ordered during 05/05/17, nor mentioned in ov note.  VS please advise regarding sleep study. Thanks

## 2017-05-25 NOTE — Telephone Encounter (Signed)
Order has been placed for split night sleep study. [facility] will contact pt with appointment.

## 2017-05-25 NOTE — Telephone Encounter (Signed)
He was non compliant with CPAP and needs to have sleep study again before he can get CPAP set up renewed.  Please schedule in lab split night sleep study.

## 2017-05-28 IMAGING — CR DG CHEST 2V
2 series · 2 of 2 positions shown · non-contrast
Comparison: Prior radiograph from 10/01/2015.

CLINICAL DATA: Initial evaluation for intermittent shortness of
breath for past week.

EXAM:
CHEST  2 VIEW

[w chest lat]
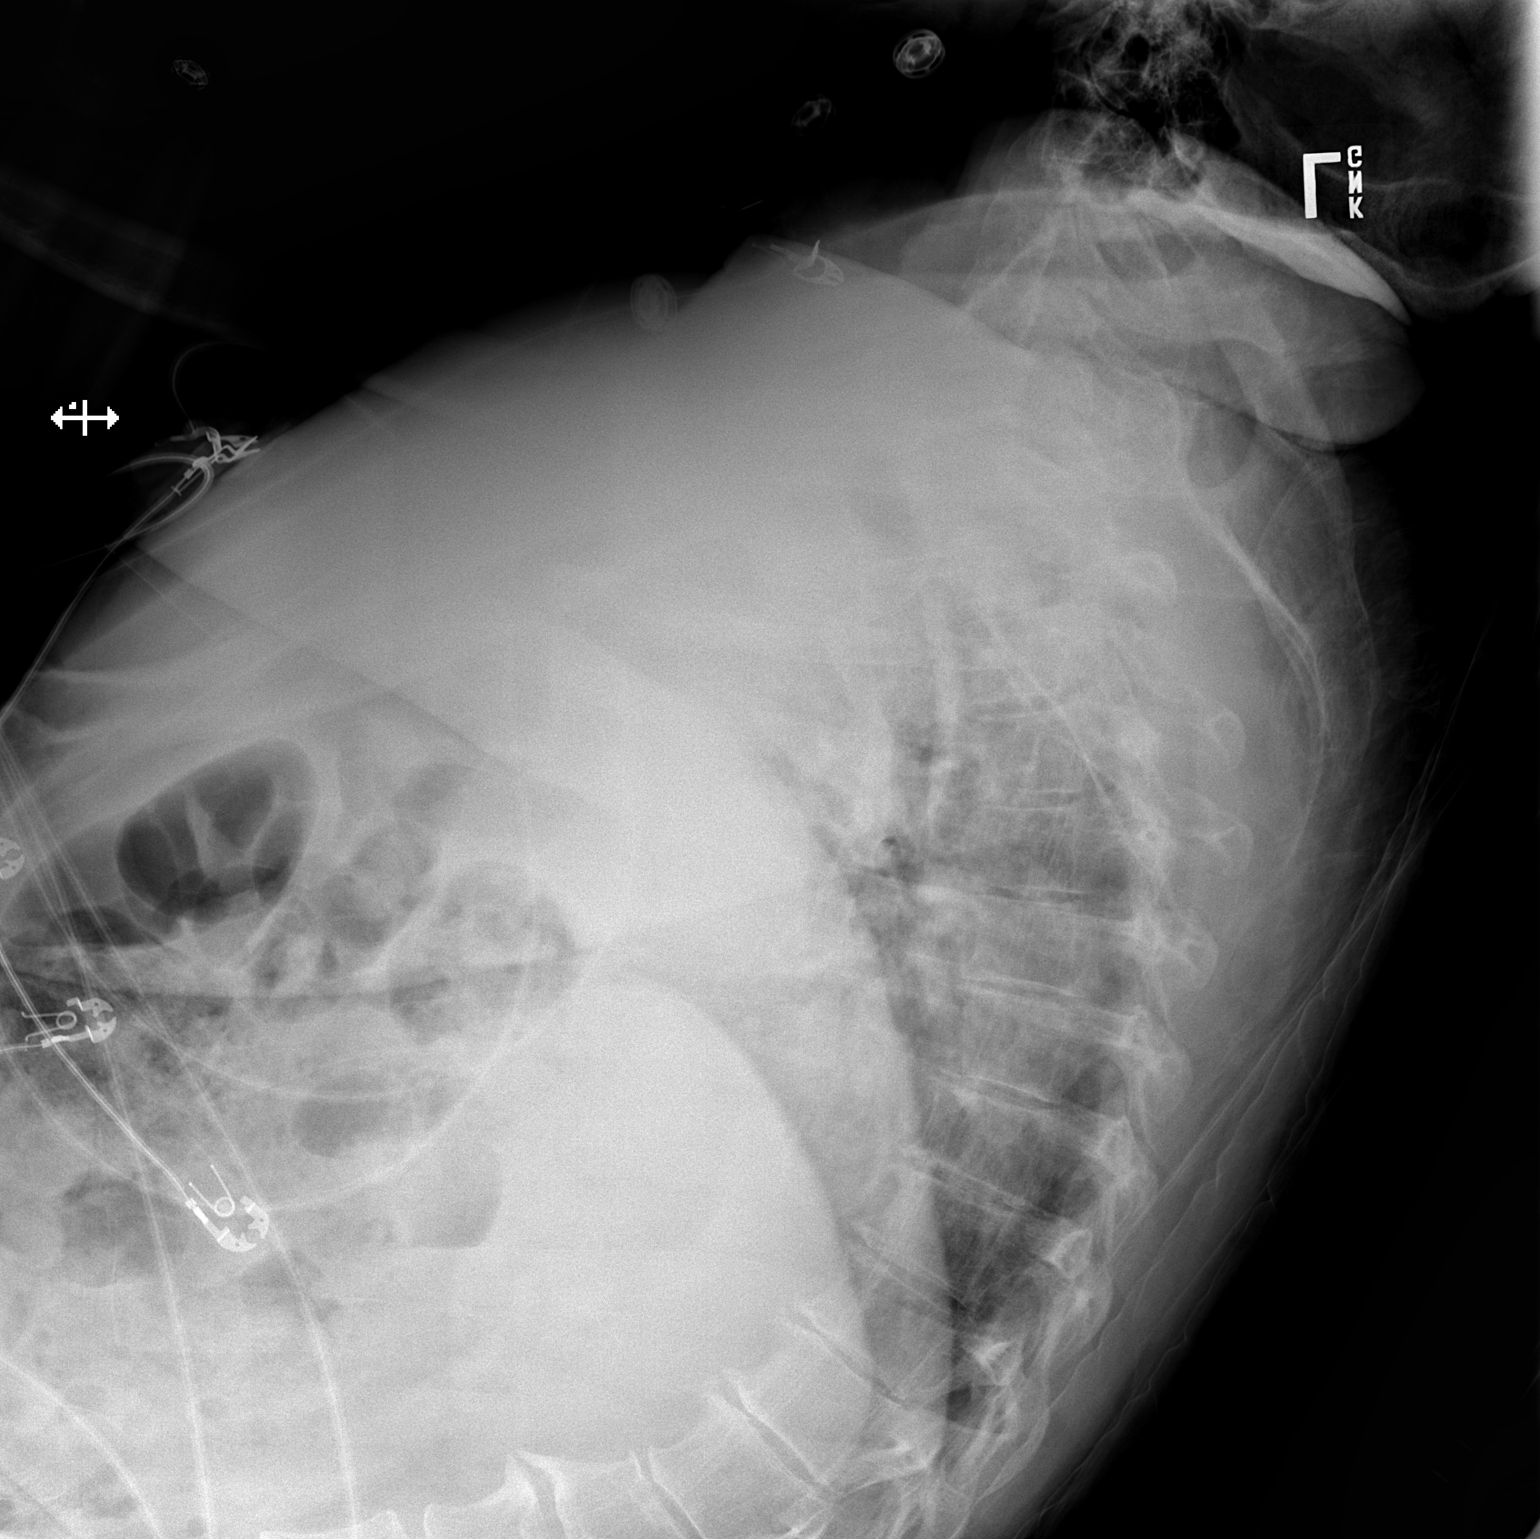

[x chest ap]
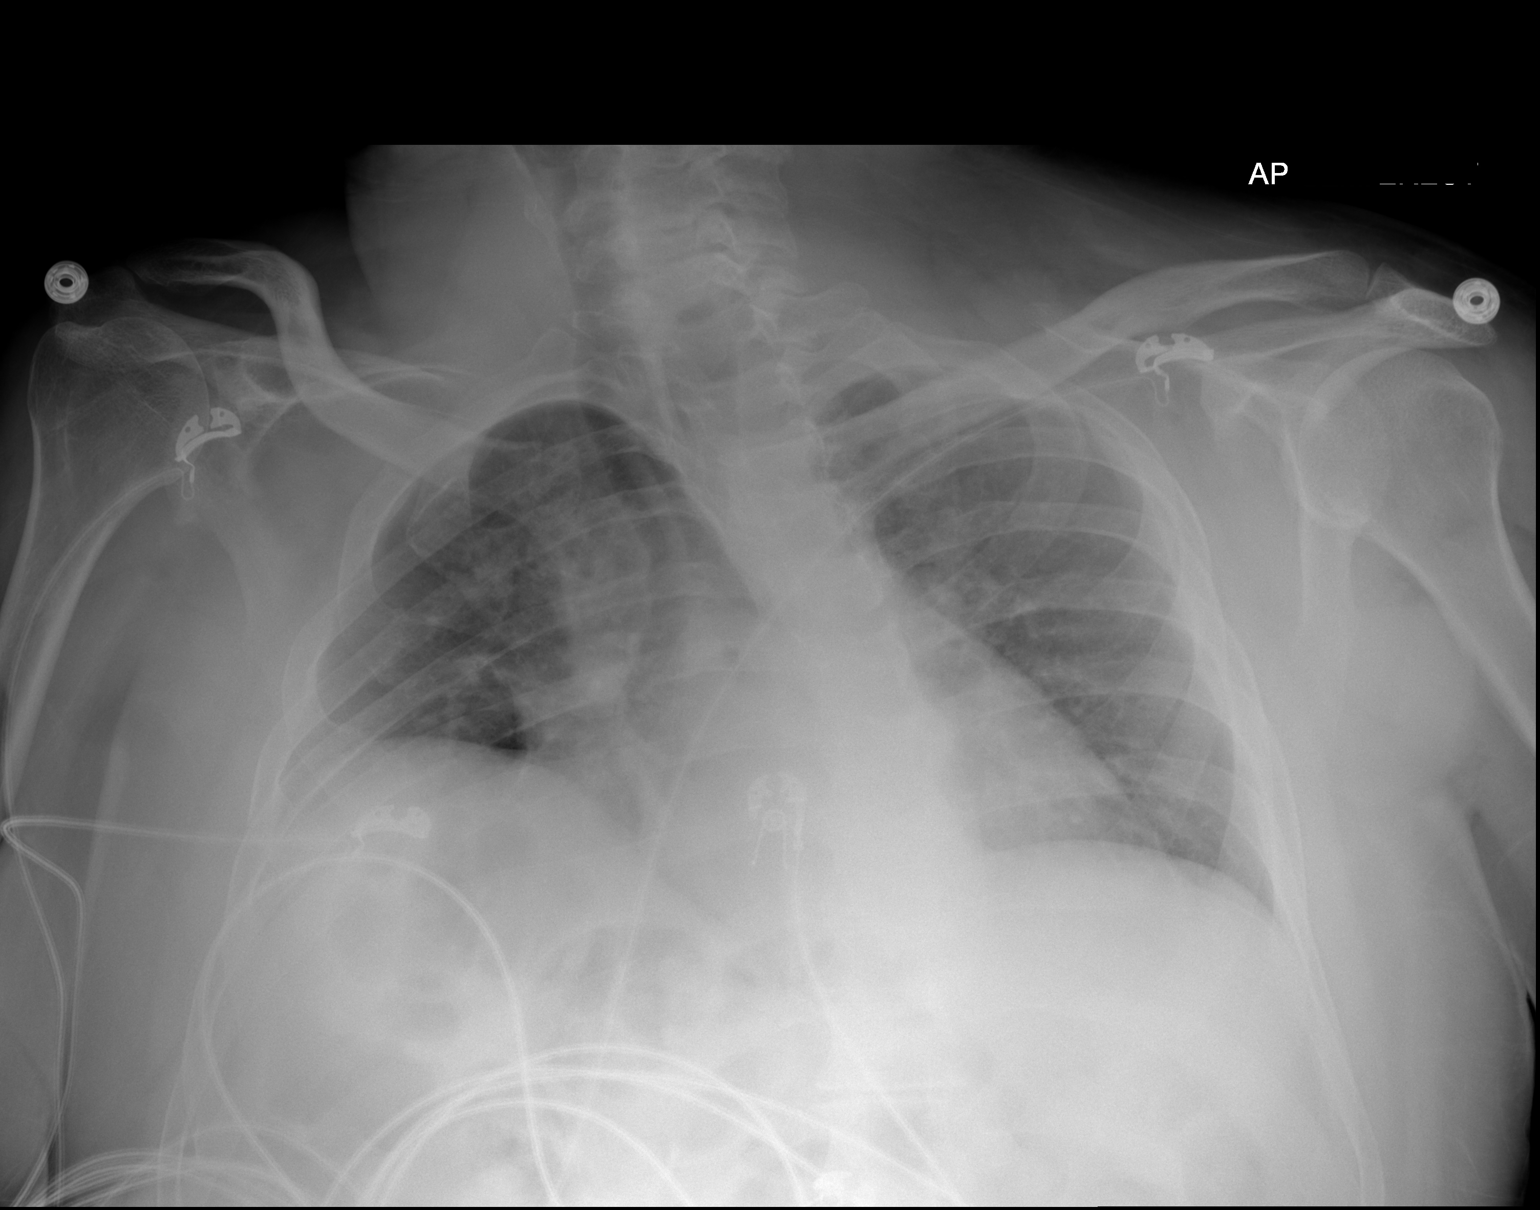

[2 of 2 positions shown; findings below may reference images not displayed]

FINDINGS: Cardiac and mediastinal silhouettes are stable. Transverse heart
size at the upper limits of normal.

Shallow lung inflation with elevation of the right hemidiaphragm,
stable from prior. Associated bronchovascular crowding at the right
lung base. No definite focal infiltrates. No pulmonary edema or
pleural effusion. No pneumothorax.

No acute osseous abnormality.
IMPRESSION: Shallow lung inflation with elevation of the right hemidiaphragm and
associated bronchovascular crowding, similar to previous. No other
active cardiopulmonary disease identified.

## 2017-06-07 ENCOUNTER — Encounter (HOSPITAL_BASED_OUTPATIENT_CLINIC_OR_DEPARTMENT_OTHER): Payer: Medicare Other

## 2017-06-07 ENCOUNTER — Telehealth: Payer: Self-pay | Admitting: Pulmonary Disease

## 2017-06-07 NOTE — Telephone Encounter (Signed)
Attempted to contact pt. No answer, no option to leave a message. Will try back.  

## 2017-06-08 ENCOUNTER — Ambulatory Visit: Payer: Medicare Other | Admitting: Gastroenterology

## 2017-06-08 NOTE — Telephone Encounter (Signed)
Spoke with pt. He wanted to let us know that he had to reschedule his sleep study. Nothing further was needed.

## 2017-06-16 ENCOUNTER — Ambulatory Visit: Payer: Medicare Other | Admitting: Physical Therapy

## 2017-06-28 ENCOUNTER — Encounter (HOSPITAL_BASED_OUTPATIENT_CLINIC_OR_DEPARTMENT_OTHER): Payer: Medicare Other

## 2017-07-20 ENCOUNTER — Emergency Department (HOSPITAL_COMMUNITY)
Admission: EM | Admit: 2017-07-20 | Discharge: 2017-07-21 | Disposition: A | Discharge status: Home / Self Care | Payer: Medicare Other | Attending: Emergency Medicine | Admitting: Emergency Medicine

## 2017-07-20 ENCOUNTER — Emergency Department (HOSPITAL_COMMUNITY): Payer: Medicare Other

## 2017-07-20 ENCOUNTER — Encounter (HOSPITAL_COMMUNITY): Payer: Self-pay

## 2017-07-20 ENCOUNTER — Other Ambulatory Visit: Payer: Self-pay

## 2017-07-20 DIAGNOSIS — Y999 Unspecified external cause status: Secondary | ICD-10-CM | POA: Diagnosis not present

## 2017-07-20 DIAGNOSIS — Z79899 Other long term (current) drug therapy: Secondary | ICD-10-CM | POA: Insufficient documentation

## 2017-07-20 DIAGNOSIS — G809 Cerebral palsy, unspecified: Secondary | ICD-10-CM | POA: Diagnosis not present

## 2017-07-20 DIAGNOSIS — L03115 Cellulitis of right lower limb: Secondary | ICD-10-CM | POA: Diagnosis not present

## 2017-07-20 DIAGNOSIS — Y929 Unspecified place or not applicable: Secondary | ICD-10-CM | POA: Diagnosis not present

## 2017-07-20 DIAGNOSIS — I5032 Chronic diastolic (congestive) heart failure: Secondary | ICD-10-CM | POA: Insufficient documentation

## 2017-07-20 DIAGNOSIS — W2203XA Walked into furniture, initial encounter: Secondary | ICD-10-CM | POA: Insufficient documentation

## 2017-07-20 DIAGNOSIS — Y9301 Activity, walking, marching and hiking: Secondary | ICD-10-CM | POA: Diagnosis not present

## 2017-07-20 DIAGNOSIS — S81801A Unspecified open wound, right lower leg, initial encounter: Secondary | ICD-10-CM | POA: Diagnosis present

## 2017-07-20 DIAGNOSIS — I110 Hypertensive heart disease with heart failure: Secondary | ICD-10-CM | POA: Insufficient documentation

## 2017-07-20 DIAGNOSIS — E119 Type 2 diabetes mellitus without complications: Secondary | ICD-10-CM | POA: Insufficient documentation

## 2017-07-20 MED ORDER — BACITRACIN ZINC 500 UNIT/GM EX OINT
1.0000 "application " | TOPICAL_OINTMENT | Freq: Two times a day (BID) | CUTANEOUS | Status: DC
  Administered 2017-07-21: 1 via TOPICAL

## 2017-07-20 MED ORDER — DOXYCYCLINE HYCLATE 100 MG PO TABS
100.0000 mg | ORAL_TABLET | Freq: Once | ORAL | Status: AC
  Administered 2017-07-20: 100 mg via ORAL
  Filled 2017-07-20: qty 1

## 2017-07-20 MED ORDER — DOXYCYCLINE HYCLATE 100 MG PO CAPS: 100.0000 mg | ORAL_CAPSULE | Freq: Two times a day (BID) | ORAL | 0 refills | Status: DC

## 2017-07-20 NOTE — ED Notes (Signed)
ED Provider at bedside. 

## 2017-07-20 NOTE — ED Notes (Signed)
[facility] called for transport.  

## 2017-07-20 NOTE — ED Provider Notes (Signed)
Patient placed in Quick Look pathway, seen and evaluated   Chief Complaint: leg swelling  HPI:   Patient presents with complaint of right leg contusion and abrasion after striking his leg on an object this morning.  He states it hurts when you touch the area, otherwise no pain.  Was sent from work with swelling.  No treatments prior to arrival.  Previous history of cellulitis.   ROS: + leg swelling, + abrasion, -fever, N/V  Physical Exam:   Gen: No distress  Neuro: Awake and Alert  Skin: Warm    Focused Exam: Heart tachycardia, nml S1,S2, no m/r/g; Lungs CTAB; Abd soft, NT, no rebound or guarding; Ext slight abrasion with surrounding swelling noted to the right lower leg medially.  No signs of cellulitis.  BP (!) 144/70 (BP Location: Left Arm)   Pulse (!) 122   Temp 99.5 F (37.5 C) (Oral)   Resp 18   SpO2 100%    Plan: X-ray ordered.  Patient appears well.  No other indications of infection or other injuries.  Initiation of care has begun. The patient has been counseled on the process, plan, and necessity for staying for the completion/evaluation, and the remainder of the medical screening examination    Carlisle Cater, PA-C 07/20/17 1345    Drenda Freeze, MD 07/20/17 [phone]

## 2017-07-20 NOTE — Discharge Instructions (Signed)
Your testing does not show any specific findings other than a wound on your leg that is slightly red and has an open wound - keep an antibiotic ointment on the wound and take doxycycline twice daily for 10 days.  ER for worsening swelling or pain or fevers

## 2017-07-20 NOTE — ED Triage Notes (Signed)
[facility]- pt coming from work and has had worsening leg swelling. Pt has hx of cellulitis, last episode last year. No cardiac hx.   HR 120 132/80 rr 16-20 Spo2 98% cbg 140

## 2017-07-20 NOTE — ED Provider Notes (Signed)
[name] EMERGENCY DEPARTMENT Provider Note   CSN: [id number] Arrival date & time: 07/20/17  1330     History   Chief Complaint Chief Complaint  Patient presents with  . Leg Swelling    HPI Thomas Ramos is a 47 y.o. male.  HPI  The patient is a 47 year old male, very pleasant, history of cerebral palsy, he is able to ambulate somewhat and this morning he was ambulating beside a piece of furniture when he struck his right lower extremity just below the knee.  This caused acute onset of a wound.  He feels as though his leg is red hot and tender.  He has no fever, no vomiting, he has had cellulitis in the past was was concerned that this may develop into that.  Past Medical History:  Diagnosis Date  . Anxiety   . Cerebral palsy ([place])   . Chronic diastolic (congestive) heart failure ([place])   . CP (cerebral palsy) ([place])   . Diabetes mellitus without complication (HCC)    borderline  . Environmental allergies    takes inhalers if needed  . Esophageal stricture   . GERD (gastroesophageal reflux disease)   . Hypertension   . Motility disorder, esophageal   . Pneumonia   . 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 S/P Botox injection    approx every 4 months  . Seasonal allergies     Patient Active Problem List   Diagnosis Date Noted  . PNA (pneumonia) 05/07/2017  . SIRS (systemic inflammatory response syndrome) (HCC)   . Acute respiratory failure with hypoxia ([place]) 03/13/2017  . 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 Rhinovirus infection 08/17/2016  . Pressure injury of skin 08/16/2016  . Acute respiratory distress 08/15/2016  . Tachycardia 08/15/2016  . Sepsis ([place]) 08/15/2016  . Onychomycosis 02/20/2016  . Essential hypertension   . OSA (obstructive sleep apnea) 12/11/2015  . Chronic diastolic (congestive) heart failure ([place])   . Chronic venous insufficiency   . Depression with anxiety 09/20/2015  . CAP (community acquired pneumonia)  09/20/2015  . Hypersomnia 07/16/2015  . Asthma exacerbation 07/16/2015  . GERD (gastroesophageal reflux disease) 07/16/2015  . Hematemesis with nausea   . Fever 05/24/2014  . Dysphagia 09/16/2010  . 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 BOTOX INJECTION N/A 12/21/2012   Procedure: BOTOX INJECTION;  Surgeon: Inda Castle, MD;  Location: [facility];  Service: Endoscopy;  Laterality: N/A;  . BOTOX INJECTION N/A 06/28/2014   Procedure: BOTOX INJECTION;  Surgeon: Inda Castle, MD;  Location: [facility];  Service: Endoscopy;  Laterality: N/A;  . ESOPHAGOGASTRODUODENOSCOPY N/A 12/21/2012   Procedure: ESOPHAGOGASTRODUODENOSCOPY (EGD);  Surgeon: Inda Castle, MD;  Location: [facility];  Service: Endoscopy;  Laterality: N/A;  . ESOPHAGOGASTRODUODENOSCOPY N/A 06/28/2014   Procedure: ESOPHAGOGASTRODUODENOSCOPY (EGD);  Surgeon: Inda Castle, MD;  Location: [facility];  Service: Endoscopy;  Laterality: N/A;  . ESOPHAGUS SURGERY     stretched esophagus  . legs    . MOUTH SURGERY    . TENDON RELEASE          Home Medications    Prior to Admission medications   Medication Sig Start Date End Date Taking? Authorizing Provider  albuterol (PROVENTIL HFA;VENTOLIN HFA) 108 (90 Base) MCG/ACT inhaler Inhale 2 puffs into the lungs every 6 (six) hours as needed for wheezing or shortness of breath. Use inhaler 3 times daily x 5 days, then every  6 hours as needed. Patient taking differently: Inhale 2 puffs into the lungs every 6 (six) hours as needed for wheezing or shortness of breath.  08/18/16  Yes Eugenie Filler, MD  amLODipine (NORVASC) 10 MG tablet Take 10 mg by mouth daily.   Yes [provider]  fluticasone (FLONASE) 50 MCG/ACT nasal spray Place 1 spray into both nostrils daily as needed for rhinitis. 08/18/16  Yes Eugenie Filler, MD  furosemide (LASIX) 40 MG tablet Take 1 tablet (40 mg total) by mouth daily.  03/18/17 03/18/18 Yes Donne Hazel, MD  latanoprost (XALATAN) 0.005 % ophthalmic solution Place 1 drop into both eyes at bedtime.   Yes [provider]  metFORMIN (GLUCOPHAGE-XR) 500 MG 24 hr tablet Take 500 mg by mouth at bedtime. 08/06/16 08/06/17 Yes [provider]  montelukast (SINGULAIR) 10 MG tablet Take 10 mg by mouth every morning. 08/12/16  Yes [provider]  nitroGLYCERIN (NITROSTAT) 0.4 MG SL tablet Place 0.4 mg under the tongue every 5 (five) minutes as needed for chest pain.    Yes [provider]  ondansetron (ZOFRAN) 4 MG tablet Take 1 tablet (4 mg total) by mouth every 6 (six) hours. 02/05/17  Yes Pollina, Gwenyth Allegra, MD  potassium chloride (K-DUR) 10 MEQ tablet Take 10 mEq by mouth daily.   Yes [provider]  rosuvastatin (CRESTOR) 5 MG tablet Take 5 mg by mouth at bedtime.   Yes [provider]  timolol (TIMOPTIC) 0.5 % ophthalmic solution Place 1 drop into both eyes every 12 (twelve) hours. 07/31/16  Yes [provider]  valsartan (DIOVAN) 80 MG tablet Take 80 mg by mouth daily.   Yes [provider]  doxycycline (VIBRAMYCIN) 100 MG capsule Take 1 capsule (100 mg total) by mouth 2 (two) times daily. 07/20/17   Noemi Chapel, MD  sucralfate (CARAFATE) 1 GM/10ML suspension Take 10 mLs (1 g total) by mouth 4 (four) times daily -  with meals and at bedtime. Patient not taking: Reported on 09/20/2015 08/11/15 09/24/15  Randal Buba, April, MD    Family History Family History  Problem Relation Age of Onset  . Hypertension Father   . Lung cancer Father   . Diabetes Mother     Social History Social History   Tobacco Use  . Smoking status: Never Smoker  . Smokeless tobacco: Former Systems developer  . Tobacco comment: smoked one time  Substance Use Topics  . Alcohol use: Yes    Alcohol/week: 0.0 oz    Comment: 2 beers every other Friday and Saturday  . Drug use: No     Allergies   Sulfamethoxazole-trimethoprim;  Metronidazole; and Penicillins   Review of Systems Review of Systems  Constitutional: Negative for fever.  Skin: Positive for wound.     Physical Exam Updated Vital Signs BP 111/70 (BP Location: Right Arm)   Pulse 94   Temp 99.5 F (37.5 C) (Oral)   Resp 18   SpO2 97%   Physical Exam  Constitutional: He appears well-developed and well-nourished.  HENT:  Head: Normocephalic and atraumatic.  Eyes: Conjunctivae are normal. Right eye exhibits no discharge. Left eye exhibits no discharge.  Pulmonary/Chest: Effort normal. No respiratory distress.  Neurological: He is alert. Coordination normal.  Skin: Skin is warm and dry. Rash noted. He is not diaphoretic. No erythema.  Facial wound with associated abrasion going distal from the proximal tibia anteriorly down towards the ankle.  There is mild warmth and redness of this area  Psychiatric:  He has a normal mood and affect.  Nursing note and vitals reviewed.    ED Treatments / Results  Labs (all labs ordered are listed, but only abnormal results are displayed) Labs Reviewed - No data to display  EKG None  Radiology Dg Tibia/fibula Right  Result Date: 07/20/2017 CLINICAL DATA:  RIGHT leg contusion. EXAM: RIGHT TIBIA AND FIBULA - 2 VIEW COMPARISON:  None. FINDINGS: There is no evidence of fracture or other focal bone lesions. Soft tissue swelling. No radiopaque foreign body. Osteopenia. IMPRESSION: No fracture.  Osteopenia.  Soft tissue swelling. Electronically Signed   By: Staci Righter M.D.   On: 07/20/2017 15:02   Dg Knee Complete 4 Views Right  Result Date: 07/20/2017 CLINICAL DATA:  RIGHT leg contusion. EXAM: RIGHT KNEE - COMPLETE 4+ VIEW COMPARISON:  None. FINDINGS: No evidence of fracture, dislocation, or joint effusion. No evidence of arthropathy or other focal bone abnormality. Soft tissues are unremarkable. IMPRESSION: Negative. Electronically Signed   By: Staci Righter M.D.   On: 07/20/2017 15:01     Procedures Procedures (including critical care time)  Medications Ordered in ED Medications  doxycycline (VIBRA-TABS) tablet 100 mg (100 mg Oral Given 07/20/17 2156)     Initial Impression / Assessment and Plan / ED Course  I have reviewed the triage vital signs and the nursing notes.  Pertinent labs & imaging results that were available during my care of the patient were reviewed by me and considered in my medical decision making (see chart for details).     Superficial wound, will provide wound care, dressing, this does not appear to be terribly infected however there is some redness and warmth associated with this that seems slightly more than expected with simple abrasion.  Doxycycline, patient agreeable and stable for discharge  Wound care Antibiotic ointment Stable for d/c.  Final Clinical Impressions(s) / ED Diagnoses   Final diagnoses:  Cellulitis of leg, right    ED Discharge Orders        Ordered    doxycycline (VIBRAMYCIN) 100 MG capsule  2 times daily     07/20/17 2313       Noemi Chapel, MD 07/20/17 2315

## 2017-07-21 ENCOUNTER — Ambulatory Visit: Payer: Medicare Other | Admitting: Gastroenterology

## 2017-07-21 DIAGNOSIS — L03115 Cellulitis of right lower limb: Secondary | ICD-10-CM | POA: Diagnosis not present

## 2017-07-21 NOTE — ED Notes (Signed)
PT states understanding of care given, follow up care, medication prescribed. PT  ambulated from ED to car with a steady gait.

## 2017-07-27 ENCOUNTER — Encounter (HOSPITAL_COMMUNITY): Payer: Self-pay | Admitting: Emergency Medicine

## 2017-07-27 ENCOUNTER — Emergency Department (HOSPITAL_COMMUNITY)
Admit: 2017-07-27 | Discharge: 2017-07-27 | Disposition: A | Discharge status: Home / Self Care | Payer: Medicare Other | Attending: Emergency Medicine | Admitting: Emergency Medicine

## 2017-07-27 ENCOUNTER — Inpatient Hospital Stay (HOSPITAL_COMMUNITY)
Admission: EM | Admit: 2017-07-27 | Discharge: 2017-07-29 | DRG: 603 | Disposition: A | Discharge status: Home / Self Care | Payer: Medicare Other | Attending: Family Medicine | Admitting: Family Medicine

## 2017-07-27 DIAGNOSIS — J302 Other seasonal allergic rhinitis: Secondary | ICD-10-CM | POA: Diagnosis present

## 2017-07-27 DIAGNOSIS — L03115 Cellulitis of right lower limb: Principal | ICD-10-CM | POA: Diagnosis present

## 2017-07-27 DIAGNOSIS — I5032 Chronic diastolic (congestive) heart failure: Secondary | ICD-10-CM | POA: Diagnosis present

## 2017-07-27 DIAGNOSIS — G4733 Obstructive sleep apnea (adult) (pediatric): Secondary | ICD-10-CM | POA: Diagnosis present

## 2017-07-27 DIAGNOSIS — Z888 Allergy status to other drugs, medicaments and biological substances status: Secondary | ICD-10-CM | POA: Diagnosis not present

## 2017-07-27 DIAGNOSIS — Z8249 Family history of ischemic heart disease and other diseases of the circulatory system: Secondary | ICD-10-CM

## 2017-07-27 DIAGNOSIS — E785 Hyperlipidemia, unspecified: Secondary | ICD-10-CM | POA: Diagnosis present

## 2017-07-27 DIAGNOSIS — M79604 Pain in right leg: Secondary | ICD-10-CM | POA: Diagnosis not present

## 2017-07-27 DIAGNOSIS — F418 Other specified anxiety disorders: Secondary | ICD-10-CM | POA: Diagnosis present

## 2017-07-27 DIAGNOSIS — I110 Hypertensive heart disease with heart failure: Secondary | ICD-10-CM | POA: Diagnosis present

## 2017-07-27 DIAGNOSIS — G809 Cerebral palsy, unspecified: Secondary | ICD-10-CM | POA: Diagnosis present

## 2017-07-27 DIAGNOSIS — M7989 Other specified soft tissue disorders: Secondary | ICD-10-CM

## 2017-07-27 DIAGNOSIS — K219 Gastro-esophageal reflux disease without esophagitis: Secondary | ICD-10-CM | POA: Diagnosis present

## 2017-07-27 DIAGNOSIS — Z880 Allergy status to penicillin: Secondary | ICD-10-CM

## 2017-07-27 DIAGNOSIS — E119 Type 2 diabetes mellitus without complications: Secondary | ICD-10-CM | POA: Diagnosis present

## 2017-07-27 DIAGNOSIS — Z79899 Other long term (current) drug therapy: Secondary | ICD-10-CM

## 2017-07-27 DIAGNOSIS — Z7951 Long term (current) use of inhaled steroids: Secondary | ICD-10-CM

## 2017-07-27 DIAGNOSIS — F419 Anxiety disorder, unspecified: Secondary | ICD-10-CM | POA: Diagnosis present

## 2017-07-27 DIAGNOSIS — Z833 Family history of diabetes mellitus: Secondary | ICD-10-CM

## 2017-07-27 DIAGNOSIS — I10 Essential (primary) hypertension: Secondary | ICD-10-CM | POA: Diagnosis not present

## 2017-07-27 DIAGNOSIS — I959 Hypotension, unspecified: Secondary | ICD-10-CM | POA: Diagnosis not present

## 2017-07-27 DIAGNOSIS — R609 Edema, unspecified: Secondary | ICD-10-CM | POA: Diagnosis not present

## 2017-07-27 LAB — BASIC METABOLIC PANEL
ANION GAP: 10 (ref 5–15)
BUN: 15 mg/dL (ref 6–20)
CHLORIDE: 102 mmol/L (ref 101–111)
CO2: 25 mmol/L (ref 22–32)
Calcium: 9.4 mg/dL (ref 8.9–10.3)
Creatinine, Ser: 0.76 mg/dL (ref 0.61–1.24)
GFR calc non Af Amer: >60 mL/min (ref >60)
Glucose, Bld: 105 mg/dL — ABNORMAL HIGH (ref 65–99)
POTASSIUM: 3.8 mmol/L (ref 3.5–5.1)
SODIUM: 137 mmol/L (ref 135–145)

## 2017-07-27 LAB — CBC WITH DIFFERENTIAL/PLATELET
ABS IMMATURE GRANULOCYTES: 0 10*3/uL (ref 0.0–0.1)
BASOS PCT: 1 %
Basophils Absolute: 0 10*3/uL (ref 0.0–0.1)
EOS ABS: 0.1 10*3/uL (ref 0.0–0.7)
Eosinophils Relative: 2 %
HCT: 39.5 % (ref 39.0–52.0)
Hemoglobin: 13 g/dL (ref 13.0–17.0)
IMMATURE GRANULOCYTES: 0 %
LYMPHS ABS: 1.8 10*3/uL (ref 0.7–4.0)
Lymphocytes Relative: 32 %
MCH: 26.1 pg (ref 26.0–34.0)
MCHC: 32.9 g/dL (ref 30.0–36.0)
MCV: 79.3 fL (ref 78.0–100.0)
Monocytes Absolute: 0.5 10*3/uL (ref 0.1–1.0)
Monocytes Relative: 9 %
NEUTROS ABS: 3.2 10*3/uL (ref 1.7–7.7)
NEUTROS PCT: 56 %
PLATELETS: 238 10*3/uL (ref 150–400)
RBC: 4.98 MIL/uL (ref 4.22–5.81)
RDW: 12.9 % (ref 11.5–15.5)
WBC: 5.6 10*3/uL (ref 4.0–10.5)

## 2017-07-27 MED ORDER — AMLODIPINE BESYLATE 10 MG PO TABS: 10.0000 mg | ORAL_TABLET | Freq: Every day | ORAL | Status: DC

## 2017-07-27 MED ORDER — POTASSIUM CHLORIDE CRYS ER 10 MEQ PO TBCR
10.0000 meq | EXTENDED_RELEASE_TABLET | Freq: Every day | ORAL | Status: DC
  Administered 2017-07-27 – 2017-07-29 (×3): 10 meq via ORAL
  Filled 2017-07-27 (×5): qty 1

## 2017-07-27 MED ORDER — MONTELUKAST SODIUM 10 MG PO TABS
10.0000 mg | ORAL_TABLET | Freq: Every morning | ORAL | Status: DC
  Administered 2017-07-28 – 2017-07-29 (×2): 10 mg via ORAL
  Filled 2017-07-27 (×3): qty 1

## 2017-07-27 MED ORDER — ACETAMINOPHEN 325 MG PO TABS: 650.0000 mg | ORAL_TABLET | Freq: Four times a day (QID) | ORAL | Status: DC | PRN

## 2017-07-27 MED ORDER — TIMOLOL MALEATE 0.5 % OP SOLN
1.0000 [drp] | Freq: Two times a day (BID) | OPHTHALMIC | Status: DC
  Administered 2017-07-27 – 2017-07-28 (×3): 1 [drp] via OPHTHALMIC
  Filled 2017-07-27 (×2): qty 5

## 2017-07-27 MED ORDER — VANCOMYCIN HCL 10 G IV SOLR
2000.0000 mg | Freq: Once | INTRAVENOUS | Status: AC
  Administered 2017-07-27: 2000 mg via INTRAVENOUS
  Filled 2017-07-27: qty 2000

## 2017-07-27 MED ORDER — METFORMIN HCL ER 500 MG PO TB24
500.0000 mg | ORAL_TABLET | Freq: Every day | ORAL | Status: DC
  Administered 2017-07-27: 500 mg via ORAL
  Filled 2017-07-27: qty 1

## 2017-07-27 MED ORDER — CLINDAMYCIN PHOSPHATE 600 MG/50ML IV SOLN
600.0000 mg | Freq: Once | INTRAVENOUS | Status: AC
  Administered 2017-07-27: 600 mg via INTRAVENOUS
  Filled 2017-07-27: qty 50

## 2017-07-27 MED ORDER — LATANOPROST 0.005 % OP SOLN
1.0000 [drp] | Freq: Every day | OPHTHALMIC | Status: DC
  Administered 2017-07-27 – 2017-07-28 (×2): 1 [drp] via OPHTHALMIC
  Filled 2017-07-27: qty 2.5

## 2017-07-27 MED ORDER — FLUTICASONE PROPIONATE 50 MCG/ACT NA SUSP: 1.0000 | Freq: Every day | NASAL | Status: DC | PRN

## 2017-07-27 MED ORDER — ALBUTEROL SULFATE HFA 108 (90 BASE) MCG/ACT IN AERS: 2.0000 | INHALATION_SPRAY | Freq: Four times a day (QID) | RESPIRATORY_TRACT | Status: DC | PRN

## 2017-07-27 MED ORDER — ONDANSETRON HCL 4 MG/2ML IJ SOLN: 4.0000 mg | Freq: Four times a day (QID) | INTRAMUSCULAR | Status: DC | PRN

## 2017-07-27 MED ORDER — IRBESARTAN 150 MG PO TABS
75.0000 mg | ORAL_TABLET | Freq: Every day | ORAL | Status: DC
  Filled 2017-07-27: qty 1

## 2017-07-27 MED ORDER — ACETAMINOPHEN 500 MG PO TABS
1000.0000 mg | ORAL_TABLET | Freq: Once | ORAL | Status: AC
  Administered 2017-07-27: 1000 mg via ORAL
  Filled 2017-07-27: qty 2

## 2017-07-27 MED ORDER — ACETAMINOPHEN 650 MG RE SUPP: 650.0000 mg | Freq: Four times a day (QID) | RECTAL | Status: DC | PRN

## 2017-07-27 MED ORDER — SODIUM CHLORIDE 0.45 % IV SOLN
INTRAVENOUS | Status: DC
  Administered 2017-07-27 – 2017-07-28 (×2): via INTRAVENOUS

## 2017-07-27 MED ORDER — VANCOMYCIN HCL IN DEXTROSE 750-5 MG/150ML-% IV SOLN
750.0000 mg | Freq: Two times a day (BID) | INTRAVENOUS | Status: DC
  Filled 2017-07-27 (×2): qty 150

## 2017-07-27 MED ORDER — FUROSEMIDE 20 MG PO TABS: 40.0000 mg | ORAL_TABLET | Freq: Every day | ORAL | Status: DC

## 2017-07-27 MED ORDER — OXYCODONE HCL 5 MG PO TABS
5.0000 mg | ORAL_TABLET | ORAL | Status: DC | PRN
  Administered 2017-07-27: 5 mg via ORAL
  Filled 2017-07-27: qty 1

## 2017-07-27 MED ORDER — ONDANSETRON HCL 4 MG PO TABS: 4.0000 mg | ORAL_TABLET | Freq: Four times a day (QID) | ORAL | Status: DC | PRN

## 2017-07-27 MED ORDER — ROSUVASTATIN CALCIUM 5 MG PO TABS
5.0000 mg | ORAL_TABLET | Freq: Every day | ORAL | Status: DC
  Administered 2017-07-27 – 2017-07-28 (×2): 5 mg via ORAL
  Filled 2017-07-27 (×2): qty 1

## 2017-07-27 MED ORDER — ONDANSETRON HCL 4 MG PO TABS
4.0000 mg | ORAL_TABLET | Freq: Four times a day (QID) | ORAL | Status: DC
  Administered 2017-07-29: 4 mg via ORAL
  Filled 2017-07-27 (×3): qty 1

## 2017-07-27 MED ORDER — ENOXAPARIN SODIUM 40 MG/0.4ML ~~LOC~~ SOLN
40.0000 mg | SUBCUTANEOUS | Status: DC
  Administered 2017-07-27 – 2017-07-28 (×2): 40 mg via SUBCUTANEOUS
  Filled 2017-07-27 (×2): qty 0.4

## 2017-07-27 MED ORDER — NITROGLYCERIN 0.4 MG SL SUBL: 0.4000 mg | SUBLINGUAL_TABLET | SUBLINGUAL | Status: DC | PRN

## 2017-07-27 NOTE — ED Notes (Signed)
Breakfast tray ordered 

## 2017-07-27 NOTE — ED Notes (Signed)
Per pharmacy, working on med rec and then rest of meds to be veriified.

## 2017-07-27 NOTE — ED Triage Notes (Signed)
Cut leg on bus last week and has taken antibiotic but leg is now red and start of cellulitis , has hx of sepsis

## 2017-07-27 NOTE — Progress Notes (Signed)
*  Preliminary Results* Right lower extremity venous duplex completed. There is no obvious evidence of right lower extremity deep vein thrombosis, however the study was very technically limited due to patient's contracted state. No evidence of right Baker's cyst.  07/27/2017 5:06 PM  Maudry Mayhew, BS, RVT, RDCS, RDMS

## 2017-07-27 NOTE — ED Provider Notes (Signed)
Patient signed out to me by Mervin Hack, PA-C.  Please see previous notes for further history.  In brief, patient with injury to the leg a week ago.  Was evaluated and placed on doxycycline for superficial abrasion.  Today, patient presents with worsening pain, significant edema of the lower leg, and mild erythema despite taking medication as prescribed.  Plan for lab evaluation and ultrasound of lower leg.  If DVT study is negative, plan for admit for cellulitis failing outpatient antibiotics.  [place] negative.  Will call for admission.  Discussed with K Sofia, Pt to be admitted to hospitalist service.   Franchot Heidelberg, PA-C 07/27/17 1746    Orlie Dakin, MD 07/28/17 [phone]

## 2017-07-27 NOTE — H&P (Addendum)
History and Physical    Thomas Ramos FMB:[id number] DOB: 04/08/1970 DOA: 07/27/2017  PCP: Chesley Noon, MD Patient coming from: home  Chief Complaint:  HPI: Thomas Ramos is a 47 y.o. male with medical history significant of cerebral palsy, cellulitis, chf, diabetes mellitus type 2, GERD and HTN. Pt reports he his his leg on a piece of furniture last week.  Pt scraped his right leg a week ago.  Pt reports he developed redness and swelling.  Pt was seen in the Ed and treated with doxycycline on May 7.  Pt reports now he has increased redness and swelling.     ED Course: Pt was seen in the ED.  Pt has a vascular ultrasound which was negative for DVT.  Pt started on clindamycin for cellulitis   Review of Systems  Constitutional: Positive for fever.  Respiratory: Negative for cough.   Cardiovascular: Negative for chest pain.  Gastrointestinal: Negative for nausea and vomiting.  Musculoskeletal: Negative for joint pain.  Endo/Heme/Allergies: Does not bruise/bleed easily.  Psychiatric/Behavioral: Negative for depression.  All other systems reviewed and are negative.   Ambulatory Status:nonambulatory  Past Medical History:  Diagnosis Date  . Anxiety   . Cerebral palsy ([place])   . Chronic diastolic (congestive) heart failure ([place])   . CP (cerebral palsy) ([place])   . Diabetes mellitus without complication (HCC)    borderline  . Environmental allergies    takes inhalers if needed  . Esophageal stricture   . GERD (gastroesophageal reflux disease)   . Hypertension   . Motility disorder, esophageal   . Pneumonia   . 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 S/P Botox injection    approx every 4 months  . Seasonal allergies     Past Surgical History:  Procedure Laterality Date  . BOTOX INJECTION N/A 12/21/2012   Procedure: BOTOX INJECTION;  Surgeon: Inda Castle, MD;  Location: [facility];  Service: Endoscopy;  Laterality: N/A;  . BOTOX INJECTION N/A 06/28/2014   Procedure:  BOTOX INJECTION;  Surgeon: Inda Castle, MD;  Location: [facility];  Service: Endoscopy;  Laterality: N/A;  . ESOPHAGOGASTRODUODENOSCOPY N/A 12/21/2012   Procedure: ESOPHAGOGASTRODUODENOSCOPY (EGD);  Surgeon: Inda Castle, MD;  Location: [facility];  Service: Endoscopy;  Laterality: N/A;  . ESOPHAGOGASTRODUODENOSCOPY N/A 06/28/2014   Procedure: ESOPHAGOGASTRODUODENOSCOPY (EGD);  Surgeon: Inda Castle, MD;  Location: [facility];  Service: Endoscopy;  Laterality: N/A;  . ESOPHAGUS SURGERY     stretched esophagus  . legs    . MOUTH SURGERY    . TENDON RELEASE      Social History   Socioeconomic History  . Marital status: Divorced    Spouse name: Not on file  . Number of children: 0  . Years of education: 6 th  . Highest education level: Not on file  Occupational History  . Occupation: Disabled  Social Needs  . Financial resource strain: Not on file  . Food insecurity:    Worry: Not on file    Inability: Not on file  . Transportation needs:    Medical: Not on file    Non-medical: Not on file  Tobacco Use  . Smoking status: Never Smoker  . Smokeless tobacco: Former Systems developer  . Tobacco comment: smoked one time  Substance and Sexual Activity  . Alcohol use: Yes    Alcohol/week: 0.0 oz    Comment: 2 beers every other Friday and Saturday  . Drug use: No  . Sexual activity: Not  on file  Lifestyle  . Physical activity:    Days per week: Not on file    Minutes per session: Not on file  . Stress: Not on file  Relationships  . Social connections:    Talks on phone: Not on file    Gets together: Not on file    Attends religious service: Not on file    Active member of club or organization: Not on file    Attends meetings of clubs or organizations: Not on file    Relationship status: Not on file  . Intimate partner violence:    Fear of current or ex partner: Not on file    Emotionally abused: Not on file    Physically abused: Not on file    Forced sexual activity:  Not on file  Other Topics Concern  . Not on file  Social History Narrative   ** Merged History Encounter **       Patient lives in a [place].[phone]   Disabled.   Education- 6 th grade   Left handed.   Caffeine- coffee one cup daily.          Allergies  Allergen Reactions  . Penicillins Hives, Nausea And Vomiting and Other (See Comments)    Patient tolerated cefazolin in 2017 Has patient had a PCN reaction causing immediate rash, facial/tongue/throat swelling, SOB or lightheadedness with hypotension: Yes Has patient had a PCN reaction causing severe rash involving mucus membranes or skin necrosis: No Has patient had a PCN reaction that required hospitalization No Has patient had a PCN reaction occurring within the last 10 years: Yes If all of the above answers are "NO", then may proceed with Cephalosporin use.  . Sulfamethoxazole-Trimethoprim Nausea And Vomiting  . Metronidazole Nausea And Vomiting    Family History  Problem Relation Age of Onset  . Hypertension Father   . Lung cancer Father   . Diabetes Mother     Prior to Admission medications   Medication Sig Start Date End Date Taking? Authorizing Provider  albuterol (PROVENTIL HFA;VENTOLIN HFA) 108 (90 Base) MCG/ACT inhaler Inhale 2 puffs into the lungs every 6 (six) hours as needed for wheezing or shortness of breath. Use inhaler 3 times daily x 5 days, then every 6 hours as needed. Patient taking differently: Inhale 2 puffs into the lungs every 6 (six) hours as needed for wheezing or shortness of breath.  08/18/16   Eugenie Filler, MD  amLODipine (NORVASC) 10 MG tablet Take 10 mg by mouth daily.    [provider]  doxycycline (VIBRAMYCIN) 100 MG capsule Take 1 capsule (100 mg total) by mouth 2 (two) times daily. 07/20/17   Noemi Chapel, MD  fluticasone (FLONASE) 50 MCG/ACT nasal spray Place 1 spray into both nostrils daily as needed for rhinitis. 08/18/16   Eugenie Filler, MD    furosemide (LASIX) 40 MG tablet Take 1 tablet (40 mg total) by mouth daily. 03/18/17 03/18/18  Donne Hazel, MD  latanoprost (XALATAN) 0.005 % ophthalmic solution Place 1 drop into both eyes at bedtime.    [provider]  metFORMIN (GLUCOPHAGE-XR) 500 MG 24 hr tablet Take 500 mg by mouth at bedtime. 08/06/16 08/06/17  [provider]  montelukast (SINGULAIR) 10 MG tablet Take 10 mg by mouth every morning. 08/12/16   [provider]  nitroGLYCERIN (NITROSTAT) 0.4 MG SL tablet Place 0.4 mg under the tongue every 5 (five) minutes as needed for chest  pain.     [provider]  ondansetron (ZOFRAN) 4 MG tablet Take 1 tablet (4 mg total) by mouth every 6 (six) hours. 02/05/17   Orpah Greek, MD  potassium chloride (K-DUR) 10 MEQ tablet Take 10 mEq by mouth daily.    [provider]  rosuvastatin (CRESTOR) 5 MG tablet Take 5 mg by mouth at bedtime.    [provider]  timolol (TIMOPTIC) 0.5 % ophthalmic solution Place 1 drop into both eyes every 12 (twelve) hours. 07/31/16   [provider]  valsartan (DIOVAN) 80 MG tablet Take 80 mg by mouth daily.    [provider]  sucralfate (CARAFATE) 1 GM/10ML suspension Take 10 mLs (1 g total) by mouth 4 (four) times daily -  with meals and at bedtime. Patient not taking: Reported on 09/20/2015 08/11/15 09/24/15  Randal Buba, April, MD    Physical Exam: Vitals:   07/27/17 1446 07/27/17 1509  BP:  111/78  Pulse:  84  Resp:  16  Temp:  98.6 F (37 C)  SpO2: 99% 98%     General: Appears calm and comfortable Eyes:  PERRL, EOMI, normal lids, iris ENT:  grossly normal hearing, lips & tongue, mmm Neck:  no LAD, masses or thyromegaly Cardiovascular:  RRR, no m/r/g. No LE edema.  Respiratory:  CTA bilaterally, no w/r/r. Normal respiratory effort. Abdomen:  soft, ntnd, NABS Skin:  no rash or induration seen on limited exam Musculoskeletal: multiple contractures,  Scabbed abrasion right  lower leg,  Erythema surround area,  Warm to touch   Psychiatric:  grossly normal mood and affect, speech fluent and appropriate, AOx3 Neurologic:  CN 2-12 grossly intact, moves all extremities in coordinated fashion, sensation intact  Labs on Admission: I have personally reviewed following labs and imaging studies  CBC: Recent Labs  Lab 07/27/17 1538  WBC 5.6  NEUTROABS 3.2  HGB 13.0  HCT 39.5  MCV 79.3  PLT 735   Basic Metabolic Panel: Recent Labs  Lab 07/27/17 1538  NA 137  K 3.8  CL 102  CO2 25  GLUCOSE 105*  BUN 15  CREATININE 0.76  CALCIUM 9.4   GFR: CrCl cannot be calculated (Unknown ideal weight.). Liver Function Tests: No results for input(s): AST, ALT, ALKPHOS, BILITOT, PROT, ALBUMIN in the last 168 hours. No results for input(s): LIPASE, AMYLASE in the last 168 hours. No results for input(s): AMMONIA in the last 168 hours. Coagulation Profile: No results for input(s): INR, PROTIME in the last 168 hours. Cardiac Enzymes: No results for input(s): CKTOTAL, CKMB, CKMBINDEX, TROPONINI in the last 168 hours. BNP (last 3 results) No results for input(s): PROBNP in the last 8760 hours. HbA1C: No results for input(s): HGBA1C in the last 72 hours. CBG: No results for input(s): GLUCAP in the last 168 hours. Lipid Profile: No results for input(s): CHOL, HDL, LDLCALC, TRIG, CHOLHDL, LDLDIRECT in the last 72 hours. Thyroid Function Tests: No results for input(s): TSH, T4TOTAL, FREET4, T3FREE, THYROIDAB in the last 72 hours. Anemia Panel: No results for input(s): VITAMINB12, FOLATE, FERRITIN, TIBC, IRON, RETICCTPCT in the last 72 hours. Urine analysis:    Component Value Date/Time   COLORURINE STRAW (A) 05/07/2017 [place] 05/07/2017 1403   LABSPEC 1.006 05/07/2017 1403   PHURINE 6.0 05/07/2017 [place] 05/07/2017 1403   HGBUR NEGATIVE 05/07/2017 1403   BILIRUBINUR NEGATIVE 05/07/2017 [place] 05/07/2017 1403     PROTEINUR NEGATIVE 05/07/2017 1403  UROBILINOGEN 1.0 05/26/2014 2211   NITRITE NEGATIVE 05/07/2017 1403   LEUKOCYTESUR NEGATIVE 05/07/2017 1403    Creatinine Clearance: CrCl cannot be calculated (Unknown ideal weight.).  Sepsis Labs: @LABRCNTIP (procalcitonin:4,lacticidven:4) )No results found for this or any previous visit (from the past 240 hour(s)).   Radiological Exams on Admission: No results found.  EKG: Independently reviewed.   Assessment/Plan Active Problems:   Cellulitis of right leg   1) Cellulitis right lower leg Pt admitted for Iv antibiotics.  Pt given Clindamycin in ED . Will treat with Vancomycin per pharmacy consult   2) Hypertension Continue diovan and norvasc  3) Hyperlipidemia Continue crestor   4)) GERD Continue carafate  5) Diabetes Mellitus Continue Metformin  DVT prophylaxis: lovenox Code Status: full Family Communication:  Disposition Plan: Admit for Iv antibiotics.  Consults called:   Admission status: Inpatient     Alyse Low PA-C [facility]  If 7PM-7AM, please contact night-coverage [email or web address] Password [facility]  07/27/2017, 6:05 PM

## 2017-07-27 NOTE — ED Notes (Signed)
Pharmacy messaged about unverified meds 

## 2017-07-27 NOTE — ED Notes (Signed)
Regular dinner tray ordered 

## 2017-07-27 NOTE — ED Provider Notes (Signed)
[facility] EMERGENCY DEPARTMENT Provider Note   CSN: [id number] Arrival date & time: 07/27/17  1431     History   Chief Complaint Chief Complaint  Patient presents with  . Leg Pain    HPI Thomas Ramos is a 47 y.o. male with a history of cerebral palsy, cellulitis, CHF, borderline diabetes mellitus, GERD, and HTN.   The patient endorses constant, worsening right lower leg pain and swelling that began over the last week when he had his right lower leg on a piece of furniture.  The patient was seen in the emergency department on May 7 with a superficial wound at that time and was discharged with a 7-day course of doxycycline.  The patient has been compliant with home antibiotics.  He denies chest pain, shortness of breath, fever, chills, left lower extremity swelling, warmth, or edema, right ankle, knee, or hip pain, numbness, or weakness.  Allergies include Bactrim.  No history of DVT or PE.  The history is provided by the patient. No language interpreter was used.  Leg Pain   Pertinent negatives include no numbness.    Past Medical History:  Diagnosis Date  . Anxiety   . Cerebral palsy ([place])   . Chronic diastolic (congestive) heart failure ([place])   . CP (cerebral palsy) ([place])   . Diabetes mellitus without complication (HCC)    borderline  . Environmental allergies    takes inhalers if needed  . Esophageal stricture   . GERD (gastroesophageal reflux disease)   . Hypertension   . Motility disorder, esophageal   . Pneumonia   . 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 S/P Botox injection    approx every 4 months  . Seasonal allergies     Patient Active Problem List   Diagnosis Date Noted  . PNA (pneumonia) 05/07/2017  . SIRS (systemic inflammatory response syndrome) (HCC)   . Acute respiratory failure with hypoxia ([place]) 03/13/2017  . 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 Rhinovirus infection 08/17/2016  . Pressure injury of skin 08/16/2016   . Acute respiratory distress 08/15/2016  . Tachycardia 08/15/2016  . Sepsis ([place]) 08/15/2016  . Onychomycosis 02/20/2016  . Essential hypertension   . OSA (obstructive sleep apnea) 12/11/2015  . Chronic diastolic (congestive) heart failure ([place])   . Chronic venous insufficiency   . Depression with anxiety 09/20/2015  . CAP (community acquired pneumonia) 09/20/2015  . Hypersomnia 07/16/2015  . Asthma exacerbation 07/16/2015  . GERD (gastroesophageal reflux disease) 07/16/2015  . Hematemesis with nausea   . Fever 05/24/2014  . Dysphagia 09/16/2010  . 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 BOTOX INJECTION N/A 12/21/2012   Procedure: BOTOX INJECTION;  Surgeon: Inda Castle, MD;  Location: [facility];  Service: Endoscopy;  Laterality: N/A;  . BOTOX INJECTION N/A 06/28/2014   Procedure: BOTOX INJECTION;  Surgeon: Inda Castle, MD;  Location: [facility];  Service: Endoscopy;  Laterality: N/A;  . ESOPHAGOGASTRODUODENOSCOPY N/A 12/21/2012   Procedure: ESOPHAGOGASTRODUODENOSCOPY (EGD);  Surgeon: Inda Castle, MD;  Location: [facility];  Service: Endoscopy;  Laterality: N/A;  . ESOPHAGOGASTRODUODENOSCOPY N/A 06/28/2014   Procedure: ESOPHAGOGASTRODUODENOSCOPY (EGD);  Surgeon: Inda Castle, MD;  Location: [place] [facility];  Service: Endoscopy;  Laterality: N/A;  . ESOPHAGUS SURGERY     stretched esophagus  . legs    . MOUTH SURGERY    . TENDON RELEASE  Home Medications    Prior to Admission medications   Medication Sig Start Date End Date Taking? Authorizing Provider  albuterol (PROVENTIL HFA;VENTOLIN HFA) 108 (90 Base) MCG/ACT inhaler Inhale 2 puffs into the lungs every 6 (six) hours as needed for wheezing or shortness of breath. Use inhaler 3 times daily x 5 days, then every 6 hours as needed. Patient taking differently: Inhale 2 puffs into the lungs every 6 (six) hours as needed for wheezing or shortness  of breath.  08/18/16   Eugenie Filler, MD  amLODipine (NORVASC) 10 MG tablet Take 10 mg by mouth daily.    [provider]  doxycycline (VIBRAMYCIN) 100 MG capsule Take 1 capsule (100 mg total) by mouth 2 (two) times daily. 07/20/17   Noemi Chapel, MD  fluticasone (FLONASE) 50 MCG/ACT nasal spray Place 1 spray into both nostrils daily as needed for rhinitis. 08/18/16   Eugenie Filler, MD  furosemide (LASIX) 40 MG tablet Take 1 tablet (40 mg total) by mouth daily. 03/18/17 03/18/18  Donne Hazel, MD  latanoprost (XALATAN) 0.005 % ophthalmic solution Place 1 drop into both eyes at bedtime.    [provider]  metFORMIN (GLUCOPHAGE-XR) 500 MG 24 hr tablet Take 500 mg by mouth at bedtime. 08/06/16 08/06/17  [provider]  montelukast (SINGULAIR) 10 MG tablet Take 10 mg by mouth every morning. 08/12/16   [provider]  nitroGLYCERIN (NITROSTAT) 0.4 MG SL tablet Place 0.4 mg under the tongue every 5 (five) minutes as needed for chest pain.     [provider]  ondansetron (ZOFRAN) 4 MG tablet Take 1 tablet (4 mg total) by mouth every 6 (six) hours. 02/05/17   Orpah Greek, MD  potassium chloride (K-DUR) 10 MEQ tablet Take 10 mEq by mouth daily.    [provider]  rosuvastatin (CRESTOR) 5 MG tablet Take 5 mg by mouth at bedtime.    [provider]  timolol (TIMOPTIC) 0.5 % ophthalmic solution Place 1 drop into both eyes every 12 (twelve) hours. 07/31/16   [provider]  valsartan (DIOVAN) 80 MG tablet Take 80 mg by mouth daily.    [provider]  sucralfate (CARAFATE) 1 GM/10ML suspension Take 10 mLs (1 g total) by mouth 4 (four) times daily -  with meals and at bedtime. Patient not taking: Reported on 09/20/2015 08/11/15 09/24/15  Randal Buba, April, MD    Family History Family History  Problem Relation Age of Onset  . Hypertension Father   . Lung cancer Father   . Diabetes Mother     Social History Social  History   Tobacco Use  . Smoking status: Never Smoker  . Smokeless tobacco: Former Systems developer  . Tobacco comment: smoked one time  Substance Use Topics  . Alcohol use: Yes    Alcohol/week: 0.0 oz    Comment: 2 beers every other Friday and Saturday  . Drug use: No     Allergies   Sulfamethoxazole-trimethoprim; Metronidazole; and Penicillins   Review of Systems Review of Systems  Constitutional: Negative for appetite change, chills and fever.  Respiratory: Negative for cough and shortness of breath.   Cardiovascular: Negative for chest pain.  Gastrointestinal: Negative for abdominal pain.  Genitourinary: Negative for dysuria.  Musculoskeletal: Positive for arthralgias and myalgias. Negative for back pain and joint swelling.  Skin: Positive for color change and wound. Negative for rash.  Allergic/Immunologic: Negative for immunocompromised state.  Neurological: Negative for weakness, numbness and headaches.  Psychiatric/Behavioral: Negative  for confusion.   Physical Exam Updated Vital Signs BP 111/78 (BP Location: Right Arm)   Pulse 84   Temp 98.6 F (37 C)   Resp 16   SpO2 98%   Physical Exam  Constitutional: He appears well-developed.  HENT:  Head: Normocephalic.  Eyes: Conjunctivae are normal.  Neck: Neck supple.  Cardiovascular: Normal rate, regular rhythm, normal heart sounds and intact distal pulses. Exam reveals no gallop and no friction rub.  No murmur heard. Pulmonary/Chest: Effort normal and breath sounds normal. No stridor. No respiratory distress. He has no wheezes. He has no rales. He exhibits no tenderness.  Abdominal: Soft. He exhibits no distension.  Musculoskeletal: He exhibits edema and tenderness. He exhibits no deformity.  Right lower extremity is edematous and warm.  There is mild erythema.  He is diffusely tender to the right lower leg and right thigh. Edema extends over the dorsum of the right foot.   There is a well-healing wound without purulent  drainage noted to the anterior aspect of the right lower leg. Strength against resistance of the bilateral lower extremities is deferred secondary to his history of cerebral palsy sensation is intact throughout. DP and PT pulses are intact.   Left lower extremity with minimal edema, but otherwise unremarkable.  Neurological: He is alert.  Skin: Skin is warm and dry. There is erythema.  Psychiatric: His behavior is normal.  Nursing note and vitals reviewed.  ED Treatments / Results  Labs (all labs ordered are listed, but only abnormal results are displayed) Labs Reviewed  CBC WITH DIFFERENTIAL/PLATELET  BASIC METABOLIC PANEL    EKG None  Radiology No results found.  Procedures Procedures (including critical care time)  Medications Ordered in ED Medications  acetaminophen (TYLENOL) tablet 1,000 mg (has no administration in time range)     Initial Impression / Assessment and Plan / ED Course  I have reviewed the triage vital signs and the nursing notes.  Pertinent labs & imaging results that were available during my care of the patient were reviewed by me and considered in my medical decision making (see chart for details).     47 year old male with a history of cerebral palsy, cellulitis, CHF, borderline diabetes mellitus, GERD, and HTN resenting with worsening edema and warmth to the right lower extremity.  Symptoms began after he ran into a piece of furniture over a week ago.  He was seen in the ED on May 7 and discharged with a 7-day course of doxycycline, which she has been compliant with, but symptoms have continued to worsen.  On exam, there is warmth, edema, and minimal erythema noted to the right lower extremity.  The area is tender to palpation diffusely and the right thigh is also diffusely tender.  Given history of endothelial injury prior to onset of symptoms, DVT study has been ordered and is pending.  Labs are also pending.  DDx includes cellulitis given the  patient's hx of recurrent infection and DVT. Doubt PE as the patient is having no CP or dyspnea. Patient care transferred to PA Caccavale at the end of my shift. Patient presentation, ED course, and plan of care discussed with review of all pertinent labs and imaging. Please see his/her note for further details regarding further ED course and disposition.  Final Clinical Impressions(s) / ED Diagnoses   Final diagnoses:  None    ED Discharge Orders    None       Peyton Spengler A, PA-C 07/27/17 1601  Sherwood Gambler, MD 07/28/17 [phone]

## 2017-07-27 NOTE — Progress Notes (Signed)
Pharmacy Antibiotic Note  Thomas Ramos is a 47 y.o. male admitted on 07/27/2017 with cellulitis.  Pharmacy has been consulted for vancomycin dosing.  Plan: Vancomycin 2gm IV x 1 then 750 mg IV q12 hours F/u renal function, cultures and clinical course     Temp (24hrs), Avg:98.6 F (37 C), Min:98.6 F (37 C), Max:98.6 F (37 C)  Recent Labs  Lab 07/27/17 1538  WBC 5.6  CREATININE 0.76    CrCl cannot be calculated (Unknown ideal weight.).    Allergies  Allergen Reactions  . Penicillins Hives, Nausea And Vomiting and Other (See Comments)    Patient tolerated cefazolin in 2017 Has patient had a PCN reaction causing immediate rash, facial/tongue/throat swelling, SOB or lightheadedness with hypotension: Yes Has patient had a PCN reaction causing severe rash involving mucus membranes or skin necrosis: No Has patient had a PCN reaction that required hospitalization No Has patient had a PCN reaction occurring within the last 10 years: Yes If all of the above answers are "NO", then may proceed with Cephalosporin use.  . Sulfamethoxazole-Trimethoprim Nausea And Vomiting  . Metronidazole Nausea And Vomiting     Thank you for allowing pharmacy to be a part of this patient's care.  [name] 07/27/2017 11:03 PM

## 2017-07-28 ENCOUNTER — Other Ambulatory Visit: Payer: Self-pay

## 2017-07-28 DIAGNOSIS — E119 Type 2 diabetes mellitus without complications: Secondary | ICD-10-CM

## 2017-07-28 DIAGNOSIS — E785 Hyperlipidemia, unspecified: Secondary | ICD-10-CM

## 2017-07-28 DIAGNOSIS — I10 Essential (primary) hypertension: Secondary | ICD-10-CM

## 2017-07-28 LAB — GLUCOSE, CAPILLARY
GLUCOSE-CAPILLARY: 132 mg/dL — AB (ref 65–99)
Glucose-Capillary: 91 mg/dL (ref 65–99)
Glucose-Capillary: 159 mg/dL — ABNORMAL HIGH (ref 65–99)

## 2017-07-28 LAB — BASIC METABOLIC PANEL
Anion gap: 8 (ref 5–15)
BUN: 13 mg/dL (ref 6–20)
CALCIUM: 9 mg/dL (ref 8.9–10.3)
CO2: 24 mmol/L (ref 22–32)
CREATININE: 0.67 mg/dL (ref 0.61–1.24)
Chloride: 106 mmol/L (ref 101–111)
Glucose, Bld: 94 mg/dL (ref 65–99)
Potassium: 4.4 mmol/L (ref 3.5–5.1)
SODIUM: 138 mmol/L (ref 135–145)

## 2017-07-28 LAB — CBC
HCT: 35.9 % — ABNORMAL LOW (ref 39.0–52.0)
Hemoglobin: 11.6 g/dL — ABNORMAL LOW (ref 13.0–17.0)
MCH: 26.2 pg (ref 26.0–34.0)
MCHC: 32.3 g/dL (ref 30.0–36.0)
MCV: 81 fL (ref 78.0–100.0)
PLATELETS: 186 10*3/uL (ref 150–400)
RBC: 4.43 MIL/uL (ref 4.22–5.81)
RDW: 12.9 % (ref 11.5–15.5)
WBC: 4.7 10*3/uL (ref 4.0–10.5)

## 2017-07-28 MED ORDER — CEFAZOLIN SODIUM-DEXTROSE 1-4 GM/50ML-% IV SOLN
1.0000 g | Freq: Three times a day (TID) | INTRAVENOUS | Status: DC
  Administered 2017-07-28 – 2017-07-29 (×3): 1 g via INTRAVENOUS
  Filled 2017-07-28 (×4): qty 50

## 2017-07-28 MED ORDER — DIPHENHYDRAMINE HCL 50 MG/ML IJ SOLN
25.0000 mg | Freq: Once | INTRAMUSCULAR | Status: AC
  Administered 2017-07-28: 25 mg via INTRAVENOUS
  Filled 2017-07-28: qty 1

## 2017-07-28 MED ORDER — INSULIN ASPART 100 UNIT/ML ~~LOC~~ SOLN: 0.0000 [IU] | Freq: Every day | SUBCUTANEOUS | Status: DC

## 2017-07-28 MED ORDER — INSULIN ASPART 100 UNIT/ML ~~LOC~~ SOLN
0.0000 [IU] | Freq: Three times a day (TID) | SUBCUTANEOUS | Status: DC
  Administered 2017-07-28: 2 [IU] via SUBCUTANEOUS

## 2017-07-28 NOTE — Progress Notes (Addendum)
PROGRESS NOTE  [name]  GHW:[id number] DOB: 1971-02-18 DOA: 07/27/2017 PCP: Chesley Noon, MD   Brief Narrative: Thomas Ramos is a 47 y.o. male with a history of CP, T2DM, GERD and HTN who presented to the ED with tenderness, swelling, and erythema of the right leg after trauma with a piece of furniture. He had been taking doxycycline as prescribed without significant improvement, and so was sent from PCP office and admitted for IV antibiotics. U/S did not demonstrated DVT.   Assessment & Plan: Active Problems:   Cellulitis of right leg  RLE nonpurulent cellulitis: Failed outpatient doxycycline.  - Change vancomycin to ancef (has tolerated in past despite h/o allergy to PCNs) - Monitor in AM, if improving, remains afebrile and no leukocytosis, will trial dose of keflex and if tolerates, DC home. - No diminished EF on echo 03/15/2017 and edema is localized to cellulitic area, so will not continue lasix, DC IVF.  T2DM: PCP noted A1c of 6.9%. Tight control needed for healing.  - Hold metformin while admitted - SSI for tight glucose control.   HTN: Currently hypotensive, though no other evidence of SIRS.  - Hold BP medications for now.   HLD:  - Continue crestor  GERD:  - Continue carafate  DVT prophylaxis: Lovenox Code Status: Full Family Communication: None at bedside Disposition Plan: Home when improving  Consultants:   None  Procedures:   Lower extremity venous doppler 5/14: No evidence of DVT was seen on limited exam.   Antimicrobials:  Clindamycin, vancomycin 5/14  Ancef 5/15 >>    Subjective: No fever, tenderness is stable, swelling is stable.   Objective: Vitals:   07/28/17 0506 07/28/17 0740 07/28/17 1132 07/28/17 1218  BP: (!) 108/59 (!) 103/56 113/76   Pulse: 73 69 82   Resp: 20 (!) 1 16   Temp:  97.7 F (36.5 C) 97.8 F (36.6 C)   TempSrc:  Oral Oral   SpO2: 98% 99% 96%   Weight:    79.5 kg (175 lb 4.3 oz)    Intake/Output Summary  (Last 24 hours) at 07/28/2017 1246 Last data filed at 07/28/2017 1018 Gross per 24 hour  Intake 790 ml  Output 875 ml  Net -85 ml   Filed Weights   07/28/17 1218  Weight: 79.5 kg (175 lb 4.3 oz)    Gen: 47 y.o. male in no distress Pulm: Non-labored breathing room air. Clear to auscultation bilaterally.  CV: Regular rate and rhythm. No murmur, rub, or gallop. No JVD. GI: Abdomen soft, non-tender, non-distended, with normoactive bowel sounds. No organomegaly or masses felt. Ext: Warm, spastic with extremity contractures. +DP pulses bilaterally.  Skin: Mild erythema to anterolateral right lower leg/shin with 1+ pitting edema, tenderness. No induration or fluctuance. No purulence from abrasion on superior lower leg.  Neuro: Alert and oriented. Psych: Judgement and insight appear normal. Mood & affect appropriate.   Data Reviewed: I have personally reviewed following labs and imaging studies  CBC: Recent Labs  Lab 07/27/17 1538 07/28/17 0805  WBC 5.6 4.7  NEUTROABS 3.2  --   HGB 13.0 11.6*  HCT 39.5 35.9*  MCV 79.3 81.0  PLT 238 789   Basic Metabolic Panel: Recent Labs  Lab 07/27/17 1538 07/28/17 0805  NA 137 138  K 3.8 4.4  CL 102 106  CO2 25 24  GLUCOSE 105* 94  BUN 15 13  CREATININE 0.76 0.67  CALCIUM 9.4 9.0   GFR: Estimated Creatinine Clearance: 113.3 mL/min (by  C-G formula based on SCr of 0.67 mg/dL). Liver Function Tests: No results for input(s): AST, ALT, ALKPHOS, BILITOT, PROT, ALBUMIN in the last 168 hours. No results for input(s): LIPASE, AMYLASE in the last 168 hours. No results for input(s): AMMONIA in the last 168 hours. Coagulation Profile: No results for input(s): INR, PROTIME in the last 168 hours. Cardiac Enzymes: No results for input(s): CKTOTAL, CKMB, CKMBINDEX, TROPONINI in the last 168 hours. BNP (last 3 results) No results for input(s): PROBNP in the last 8760 hours. HbA1C: No results for input(s): HGBA1C in the last 72  hours. CBG: Recent Labs  Lab 07/28/17 1129  GLUCAP 91   Lipid Profile: No results for input(s): CHOL, HDL, LDLCALC, TRIG, CHOLHDL, LDLDIRECT in the last 72 hours. Thyroid Function Tests: No results for input(s): TSH, T4TOTAL, FREET4, T3FREE, THYROIDAB in the last 72 hours. Anemia Panel: No results for input(s): VITAMINB12, FOLATE, FERRITIN, TIBC, IRON, RETICCTPCT in the last 72 hours. Urine analysis:    Component Value Date/Time   COLORURINE STRAW (A) 05/07/2017 1403   APPEARANCEUR CLEAR 05/07/2017 1403   LABSPEC 1.006 05/07/2017 1403   PHURINE 6.0 05/07/2017 1403   GLUCOSEU NEGATIVE 05/07/2017 1403   HGBUR NEGATIVE 05/07/2017 1403   BILIRUBINUR NEGATIVE 05/07/2017 1403   KETONESUR NEGATIVE 05/07/2017 1403   PROTEINUR NEGATIVE 05/07/2017 1403   UROBILINOGEN 1.0 05/26/2014 2211   NITRITE NEGATIVE 05/07/2017 1403   LEUKOCYTESUR NEGATIVE 05/07/2017 1403   No results found for this or any previous visit (from the past 240 hour(s)).    Radiology Studies: No results found.  Scheduled Meds: . enoxaparin (LOVENOX) injection  40 mg Subcutaneous Q24H  . insulin aspart  0-5 Units Subcutaneous QHS  . insulin aspart  0-9 Units Subcutaneous TID WC  . latanoprost  1 drop Both Eyes QHS  . montelukast  10 mg Oral q morning - 10a  . ondansetron  4 mg Oral Q6H  . potassium chloride  10 mEq Oral Daily  . rosuvastatin  5 mg Oral QHS  . timolol  1 drop Both Eyes Q12H   Continuous Infusions: . sodium chloride 50 mL/hr at 07/28/17 1020  . vancomycin       LOS: 1 day   Time spent: 25 minutes.  Patrecia Pour, MD [facility] Pager [phone]  If 7PM-7AM, please contact night-coverage [email or web address] Password [facility] 07/28/2017, 12:46 PM

## 2017-07-28 NOTE — ED Notes (Signed)
Pt reports itching, floor coverage messaged. Will continue to monitor.

## 2017-07-28 NOTE — ED Notes (Signed)
Pt alert, resting, Vital signs stable

## 2017-07-28 NOTE — Plan of Care (Signed)
  Problem: Education: Goal: Knowledge of General Education information will improve Outcome: Progressing   Problem: Health Behavior/Discharge Planning: Goal: Ability to manage health-related needs will improve Outcome: Progressing   Problem: Clinical Measurements: Goal: Ability to maintain clinical measurements within normal limits will improve Outcome: Progressing Goal: Will remain free from infection Outcome: Progressing Goal: Diagnostic test results will improve Outcome: Progressing Goal: Respiratory complications will improve Outcome: Progressing Goal: Cardiovascular complication will be avoided Outcome: Progressing   Problem: Activity: Goal: Risk for activity intolerance will decrease Outcome: Progressing   Problem: Nutrition: Goal: Adequate nutrition will be maintained Outcome: Progressing   Problem: Coping: Goal: Level of anxiety will decrease Outcome: Progressing   Problem: Elimination: Goal: Will not experience complications related to bowel motility Outcome: Progressing Goal: Will not experience complications related to urinary retention Outcome: Progressing   Problem: Pain Managment: Goal: General experience of comfort will improve Outcome: Progressing   Problem: Safety: Goal: Ability to remain free from injury will improve Outcome: Progressing   Problem: Skin Integrity: Goal: Risk for impaired skin integrity will decrease Outcome: Progressing   Problem: Clinical Measurements: Goal: Ability to avoid or minimize complications of infection will improve Outcome: Progressing   Problem: Skin Integrity: Goal: Skin integrity will improve Outcome: Progressing   

## 2017-07-28 NOTE — ED Notes (Signed)
Report given to 3W RN

## 2017-07-29 LAB — GLUCOSE, CAPILLARY: GLUCOSE-CAPILLARY: 95 mg/dL (ref 65–99)

## 2017-07-29 MED ORDER — CEPHALEXIN 500 MG PO CAPS: 500.0000 mg | ORAL_CAPSULE | Freq: Four times a day (QID) | ORAL | Status: DC

## 2017-07-29 MED ORDER — CEPHALEXIN 500 MG PO CAPS: 500.0000 mg | ORAL_CAPSULE | Freq: Three times a day (TID) | ORAL | 0 refills | Status: DC

## 2017-07-29 NOTE — Progress Notes (Signed)
Discharge instructions reviewed with patient. All questions answered at this time. RX given. Transport home by [employer].   Ave Filter, RN

## 2017-07-29 NOTE — Discharge Summary (Signed)
Physician Discharge Summary  Thomas Ramos:[id number] DOB: 12-06-1970 DOA: 07/27/2017  PCP: Chesley Noon, MD  Admit date: 07/27/2017 Discharge date: 07/29/2017  Admitted From: Home Disposition: Home   Recommendations for Outpatient Follow-up:  1. Follow up with PCP in the next week for cellulitis reevaluation.  Home Health: None new Equipment/Devices: None new Discharge Condition: Stable CODE STATUS: Full Diet recommendation: Heart healthy, carb-modified  Brief/Interim Summary: Thomas Ramos is a 47 y.o. male with a history of CP, T2DM, GERD and HTN who presented to the ED with tenderness, swelling, and erythema of the right leg after trauma with a piece of furniture. He had been taking doxycycline as prescribed without significant improvement, and so was sent from PCP office and admitted for IV antibiotics. U/S did not demonstrate DVT. He was initialyl given clindamycin then vancomycin, and has shown improvement since transition to ancef. He has tolerated 1st generation cephalosporins in the past and currently, so keflex is continued and PCP follow up is recommended to monitor for resolution.  Discharge Diagnoses:  Active Problems:   Cellulitis of right leg  RLE nonpurulent cellulitis: Failed outpatient doxycycline. Remains afebrile and no leukocytosis - Changed vancomycin to ancef (has tolerated in past and while admitted despite h/o allergy to [facility]) with continued improvement. Continue keflex at discharge.  T2DM: PCP noted A1c of 6.9%. Tight control needed for healing.  - Held metformin while admitted, but will restart at DC - SSI for tight glucose control.   HTN: Initially low-normal BPs - Follow up BP and consider medication adjustment.  HLD:  - Continue crestor  GERD:  - Continue carafate  Discharge Instructions Discharge Instructions    Diet - low sodium heart healthy   Complete by:  As directed    Discharge instructions   Complete by:  As directed    You were admitted for cellulitis of the right leg that did not improve enough on oral antibiotics. Now that you've had a couple days of IV antibiotics and the infection has improved, you are stable for discharge with the following recommendations:  - Start taking keflex as sent to your pharmacy.  - Follow up with your primary doctor in the next week for recheck.  - If your symptoms worsen, you develop fever or trouble breathing, seek medical attention sooner.   Increase activity slowly   Complete by:  As directed      Allergies as of 07/29/2017      Reactions   Penicillins Hives, Nausea And Vomiting, Other (See Comments)   Patient tolerated cefazolin in 2017 Has patient had a PCN reaction causing immediate rash, facial/tongue/throat swelling, SOB or lightheadedness with hypotension: Yes Has patient had a PCN reaction causing severe rash involving mucus membranes or skin necrosis: No Has patient had a PCN reaction that required hospitalization No Has patient had a PCN reaction occurring within the last 10 years: Yes If all of the above answers are "NO", then may proceed with Cephalosporin use.   Sulfamethoxazole-trimethoprim Nausea And Vomiting   Metronidazole Nausea And Vomiting      Medication List    TAKE these medications   amLODipine 10 MG tablet Commonly known as:  NORVASC Take 10 mg by mouth daily.   budesonide-formoterol 80-4.5 MCG/ACT inhaler Commonly known as:  SYMBICORT Inhale 2 puffs into the lungs 2 (two) times daily.   cephALEXin 500 MG capsule Commonly known as:  KEFLEX Take 1 capsule (500 mg total) by mouth every 8 (eight) hours.   furosemide  20 MG tablet Commonly known as:  LASIX Take 20 mg by mouth daily.   latanoprost 0.005 % ophthalmic solution Commonly known as:  XALATAN Place 1 drop into both eyes at bedtime.   metFORMIN 500 MG 24 hr tablet Commonly known as:  GLUCOPHAGE-XR Take 500 mg by mouth at bedtime.   montelukast 10 MG tablet Commonly known  as:  SINGULAIR Take 10 mg by mouth every morning.   naproxen sodium 220 MG tablet Commonly known as:  ALEVE Take 220-440 mg by mouth 2 (two) times daily as needed (for pain).   ondansetron 4 MG tablet Commonly known as:  ZOFRAN Take 1 tablet (4 mg total) by mouth every 6 (six) hours. What changed:    when to take this  reasons to take this   potassium chloride 10 MEQ tablet Commonly known as:  K-DUR Take 10 mEq by mouth daily.   rosuvastatin 5 MG tablet Commonly known as:  CRESTOR Take 5 mg by mouth at bedtime.   timolol 0.5 % ophthalmic solution Commonly known as:  TIMOPTIC Place 1 drop into both eyes every 12 (twelve) hours.   valsartan 80 MG tablet Commonly known as:  DIOVAN Take 80 mg by mouth daily.      Follow-up Information    Chesley Noon, MD. Schedule an appointment as soon as possible for a visit in 1 week(s).   Specialty:  Family Medicine Contact information: [place] [address] [phone]          Allergies  Allergen Reactions  . Penicillins Hives, Nausea And Vomiting and Other (See Comments)    Patient tolerated cefazolin in 2017 Has patient had a PCN reaction causing immediate rash, facial/tongue/throat swelling, SOB or lightheadedness with hypotension: Yes Has patient had a PCN reaction causing severe rash involving mucus membranes or skin necrosis: No Has patient had a PCN reaction that required hospitalization No Has patient had a PCN reaction occurring within the last 10 years: Yes If all of the above answers are "NO", then may proceed with Cephalosporin use.  . Sulfamethoxazole-Trimethoprim Nausea And Vomiting  . Metronidazole Nausea And Vomiting    Consultations:  None  Procedures/Studies: Dg Tibia/fibula Right  Result Date: 07/20/2017 CLINICAL DATA:  RIGHT leg contusion. EXAM: RIGHT TIBIA AND FIBULA - 2 VIEW COMPARISON:  None. FINDINGS: There is no evidence of fracture or other focal bone lesions. Soft  tissue swelling. No radiopaque foreign body. Osteopenia. IMPRESSION: No fracture.  Osteopenia.  Soft tissue swelling. Electronically Signed   By: Staci Righter M.D.   On: 07/20/2017 15:02   Dg Knee Complete 4 Views Right  Result Date: 07/20/2017 CLINICAL DATA:  RIGHT leg contusion. EXAM: RIGHT KNEE - COMPLETE 4+ VIEW COMPARISON:  None. FINDINGS: No evidence of fracture, dislocation, or joint effusion. No evidence of arthropathy or other focal bone abnormality. Soft tissues are unremarkable. IMPRESSION: Negative. Electronically Signed   By: Staci Righter M.D.   On: 07/20/2017 15:01    Lower extremity venous doppler 5/14: No evidence of DVT was seen on limited exam.   Subjective: No fever. Feels tenderness and swelling have improved in right leg. Wants to go home.  Discharge Exam: Vitals:   07/29/17 0400 07/29/17 0802  BP: (!) 120/93 128/79  Pulse: 73 71  Resp: 20 17  Temp: 98.6 F (37 C)   SpO2: 97% 99%   General: Pt is alert, awake, not in acute distress Cardiovascular: RRR, S1/S2 +, no rubs, no gallops Respiratory: CTA bilaterally,  no wheezing, no rhonchi Abdominal: Soft, NT, ND, bowel sounds + Extremities: Warm, spastic with extremity contractures. +DP pulses bilaterally.  Skin: Mild erythema to anterolateral right lower leg/shin with trace pitting edema and interval resolution of tenderness and improvement of erythema. No induration or fluctuance. Wound inferior to latera right knee is scabbed/covered without discharge.  Labs: BNP (last 3 results) Recent Labs    03/14/17 0345 03/16/17 0223 05/07/17 1355  BNP 21.6 275.7* 7.0   Basic Metabolic Panel: Recent Labs  Lab 07/27/17 1538 07/28/17 0805  NA 137 138  K 3.8 4.4  CL 102 106  CO2 25 24  GLUCOSE 105* 94  BUN 15 13  CREATININE 0.76 0.67  CALCIUM 9.4 9.0   Liver Function Tests: No results for input(s): AST, ALT, ALKPHOS, BILITOT, PROT, ALBUMIN in the last 168 hours. No results for input(s): LIPASE, AMYLASE in  the last 168 hours. No results for input(s): AMMONIA in the last 168 hours. CBC: Recent Labs  Lab 07/27/17 1538 07/28/17 0805  WBC 5.6 4.7  NEUTROABS 3.2  --   HGB 13.0 11.6*  HCT 39.5 35.9*  MCV 79.3 81.0  PLT 238 186   Cardiac Enzymes: No results for input(s): CKTOTAL, CKMB, CKMBINDEX, TROPONINI in the last 168 hours. BNP: Invalid input(s): POCBNP CBG: Recent Labs  Lab 07/28/17 1129 07/28/17 1626 07/28/17 2210 07/29/17 0602  GLUCAP 91 159* 132* 95   D-Dimer No results for input(s): DDIMER in the last 72 hours. Hgb A1c No results for input(s): HGBA1C in the last 72 hours. Lipid Profile No results for input(s): CHOL, HDL, LDLCALC, TRIG, CHOLHDL, LDLDIRECT in the last 72 hours. Thyroid function studies No results for input(s): TSH, T4TOTAL, T3FREE, THYROIDAB in the last 72 hours.  Invalid input(s): FREET3 Anemia work up No results for input(s): VITAMINB12, FOLATE, FERRITIN, TIBC, IRON, RETICCTPCT in the last 72 hours. Urinalysis    Component Value Date/Time   COLORURINE STRAW (A) 05/07/2017 1403   APPEARANCEUR CLEAR 05/07/2017 1403   LABSPEC 1.006 05/07/2017 1403   PHURINE 6.0 05/07/2017 1403   GLUCOSEU NEGATIVE 05/07/2017 1403   HGBUR NEGATIVE 05/07/2017 1403   BILIRUBINUR NEGATIVE 05/07/2017 1403   KETONESUR NEGATIVE 05/07/2017 1403   PROTEINUR NEGATIVE 05/07/2017 1403   UROBILINOGEN 1.0 05/26/2014 2211   NITRITE NEGATIVE 05/07/2017 1403   LEUKOCYTESUR NEGATIVE 05/07/2017 1403    Microbiology No results found for this or any previous visit (from the past 240 hour(s)).  Time coordinating discharge: Approximately 40 minutes  Patrecia Pour, MD  [facility] 07/29/2017, 10:50 AM Pager [phone]

## 2017-07-29 NOTE — Care Management Note (Signed)
Case Management Note  Patient Details  Name: Thomas Ramos MRN: [id number] Date of Birth: [birth year]  Subjective/Objective:                    Action/Plan: Pt discharging home with self care. Pt states he has aides that assist him at home daily. Pt is wheelchair bound at baseline.  PT asking for PTAR home. CM called and arranged for 12:30 pm pick up time. Transport form on the chart and bedside RN updated. Pt requested prescription be printed so he can pick up today. His pharmacy delivers but may take a day or 2 to receive meds. MD updated.   Expected Discharge Date:  07/29/17               Expected Discharge Plan:  Home/Self Care  In-House Referral:     Discharge planning Services  CM Consult, Other - See comment(transportation)  Post Acute Care Choice:    Choice offered to:     DME Arranged:    DME Agency:     HH Arranged:    HH Agency:     Status of Service:  Completed, signed off  If discussed at [employer] of Stay Meetings, dates discussed:    Additional Comments:  Pollie Friar, RN 07/29/2017, 11:08 AM

## 2017-08-03 ENCOUNTER — Emergency Department (HOSPITAL_COMMUNITY): Payer: Medicare Other

## 2017-08-03 ENCOUNTER — Other Ambulatory Visit: Payer: Self-pay

## 2017-08-03 ENCOUNTER — Inpatient Hospital Stay (HOSPITAL_COMMUNITY)
Admission: EM | Admit: 2017-08-03 | Discharge: 2017-08-06 | DRG: 638 | Disposition: A | Discharge status: Home / Self Care | Payer: Medicare Other | Attending: Internal Medicine | Admitting: Internal Medicine

## 2017-08-03 ENCOUNTER — Emergency Department (HOSPITAL_COMMUNITY)
Admit: 2017-08-03 | Discharge: 2017-08-03 | Disposition: A | Discharge status: Home / Self Care | Payer: Medicare Other | Attending: Emergency Medicine | Admitting: Emergency Medicine

## 2017-08-03 DIAGNOSIS — I872 Venous insufficiency (chronic) (peripheral): Secondary | ICD-10-CM | POA: Diagnosis present

## 2017-08-03 DIAGNOSIS — Z79899 Other long term (current) drug therapy: Secondary | ICD-10-CM | POA: Diagnosis not present

## 2017-08-03 DIAGNOSIS — F419 Anxiety disorder, unspecified: Secondary | ICD-10-CM | POA: Diagnosis present

## 2017-08-03 DIAGNOSIS — J302 Other seasonal allergic rhinitis: Secondary | ICD-10-CM | POA: Diagnosis present

## 2017-08-03 DIAGNOSIS — M24562 Contracture, left knee: Secondary | ICD-10-CM | POA: Diagnosis present

## 2017-08-03 DIAGNOSIS — Z880 Allergy status to penicillin: Secondary | ICD-10-CM | POA: Diagnosis not present

## 2017-08-03 DIAGNOSIS — Z87891 Personal history of nicotine dependence: Secondary | ICD-10-CM

## 2017-08-03 DIAGNOSIS — G809 Cerebral palsy, unspecified: Secondary | ICD-10-CM | POA: Diagnosis present

## 2017-08-03 DIAGNOSIS — F418 Other specified anxiety disorders: Secondary | ICD-10-CM | POA: Diagnosis present

## 2017-08-03 DIAGNOSIS — L03115 Cellulitis of right lower limb: Secondary | ICD-10-CM | POA: Diagnosis present

## 2017-08-03 DIAGNOSIS — Z7951 Long term (current) use of inhaled steroids: Secondary | ICD-10-CM | POA: Diagnosis not present

## 2017-08-03 DIAGNOSIS — I5032 Chronic diastolic (congestive) heart failure: Secondary | ICD-10-CM | POA: Diagnosis present

## 2017-08-03 DIAGNOSIS — E118 Type 2 diabetes mellitus with unspecified complications: Secondary | ICD-10-CM

## 2017-08-03 DIAGNOSIS — I110 Hypertensive heart disease with heart failure: Secondary | ICD-10-CM | POA: Diagnosis present

## 2017-08-03 DIAGNOSIS — Z833 Family history of diabetes mellitus: Secondary | ICD-10-CM

## 2017-08-03 DIAGNOSIS — E1151 Type 2 diabetes mellitus with diabetic peripheral angiopathy without gangrene: Secondary | ICD-10-CM | POA: Diagnosis present

## 2017-08-03 DIAGNOSIS — G4733 Obstructive sleep apnea (adult) (pediatric): Secondary | ICD-10-CM | POA: Diagnosis present

## 2017-08-03 DIAGNOSIS — M24561 Contracture, right knee: Secondary | ICD-10-CM | POA: Diagnosis present

## 2017-08-03 DIAGNOSIS — Z7984 Long term (current) use of oral hypoglycemic drugs: Secondary | ICD-10-CM | POA: Diagnosis not present

## 2017-08-03 DIAGNOSIS — E875 Hyperkalemia: Secondary | ICD-10-CM | POA: Diagnosis present

## 2017-08-03 DIAGNOSIS — I959 Hypotension, unspecified: Secondary | ICD-10-CM | POA: Diagnosis not present

## 2017-08-03 DIAGNOSIS — R1310 Dysphagia, unspecified: Secondary | ICD-10-CM

## 2017-08-03 DIAGNOSIS — E11628 Type 2 diabetes mellitus with other skin complications: Secondary | ICD-10-CM | POA: Diagnosis not present

## 2017-08-03 DIAGNOSIS — M7989 Other specified soft tissue disorders: Secondary | ICD-10-CM | POA: Diagnosis present

## 2017-08-03 DIAGNOSIS — K219 Gastro-esophageal reflux disease without esophagitis: Secondary | ICD-10-CM | POA: Diagnosis present

## 2017-08-03 DIAGNOSIS — Z881 Allergy status to other antibiotic agents status: Secondary | ICD-10-CM

## 2017-08-03 DIAGNOSIS — M79604 Pain in right leg: Secondary | ICD-10-CM

## 2017-08-03 DIAGNOSIS — Z8249 Family history of ischemic heart disease and other diseases of the circulatory system: Secondary | ICD-10-CM

## 2017-08-03 HISTORY — DX: Cellulitis, unspecified: L03.90

## 2017-08-03 LAB — CBC WITH DIFFERENTIAL/PLATELET
Abs Immature Granulocytes: 0 10*3/uL (ref 0.0–0.1)
Basophils Absolute: 0 10*3/uL (ref 0.0–0.1)
Basophils Relative: 1 %
EOS ABS: 0.1 10*3/uL (ref 0.0–0.7)
EOS PCT: 2 %
HEMATOCRIT: 38.4 % — AB (ref 39.0–52.0)
HEMOGLOBIN: 12.5 g/dL — AB (ref 13.0–17.0)
IMMATURE GRANULOCYTES: 0 %
LYMPHS ABS: 1.8 10*3/uL (ref 0.7–4.0)
LYMPHS PCT: 32 %
MCH: 25.8 pg — ABNORMAL LOW (ref 26.0–34.0)
MCHC: 32.6 g/dL (ref 30.0–36.0)
MCV: 79.3 fL (ref 78.0–100.0)
MONOS PCT: 8 %
Monocytes Absolute: 0.5 10*3/uL (ref 0.1–1.0)
Neutro Abs: 3.3 10*3/uL (ref 1.7–7.7)
Neutrophils Relative %: 57 %
Platelets: 260 10*3/uL (ref 150–400)
RBC: 4.84 MIL/uL (ref 4.22–5.81)
RDW: 12.9 % (ref 11.5–15.5)
WBC: 5.8 10*3/uL (ref 4.0–10.5)

## 2017-08-03 LAB — COMPREHENSIVE METABOLIC PANEL
ALBUMIN: 3.9 g/dL (ref 3.5–5.0)
ALK PHOS: 120 U/L (ref 38–126)
ALT: 19 U/L (ref 17–63)
AST: 16 U/L (ref 15–41)
Anion gap: 10 (ref 5–15)
BILIRUBIN TOTAL: 0.4 mg/dL (ref 0.3–1.2)
BUN: 14 mg/dL (ref 6–20)
CO2: 24 mmol/L (ref 22–32)
Calcium: 9.1 mg/dL (ref 8.9–10.3)
Chloride: 103 mmol/L (ref 101–111)
Creatinine, Ser: 0.88 mg/dL (ref 0.61–1.24)
GFR calc Af Amer: >60 mL/min (ref >60)
GLUCOSE: 133 mg/dL — AB (ref 65–99)
POTASSIUM: 4 mmol/L (ref 3.5–5.1)
Sodium: 137 mmol/L (ref 135–145)
TOTAL PROTEIN: 6.9 g/dL (ref 6.5–8.1)

## 2017-08-03 LAB — D-DIMER, QUANTITATIVE: D-Dimer, Quant: 0.6 ug/mL-FEU — ABNORMAL HIGH (ref 0.00–0.50)

## 2017-08-03 LAB — I-STAT CG4 LACTIC ACID, ED: Lactic Acid, Venous: 1.21 mmol/L (ref 0.5–1.9)

## 2017-08-03 LAB — BRAIN NATRIURETIC PEPTIDE: B Natriuretic Peptide: 14.5 pg/mL (ref 0.0–100.0)

## 2017-08-03 LAB — PROTIME-INR
INR: 1.01
PROTHROMBIN TIME: 13.2 s (ref 11.4–15.2)

## 2017-08-03 MED ORDER — POTASSIUM CHLORIDE IN NACL 20-0.9 MEQ/L-% IV SOLN
INTRAVENOUS | Status: DC
  Administered 2017-08-04: 01:00:00 via INTRAVENOUS
  Filled 2017-08-03: qty 1000

## 2017-08-03 MED ORDER — SODIUM CHLORIDE 0.9% FLUSH: 3.0000 mL | INTRAVENOUS | Status: DC | PRN

## 2017-08-03 MED ORDER — MONTELUKAST SODIUM 10 MG PO TABS
10.0000 mg | ORAL_TABLET | Freq: Every day | ORAL | Status: DC
  Administered 2017-08-04 – 2017-08-06 (×3): 10 mg via ORAL
  Filled 2017-08-03 (×3): qty 1

## 2017-08-03 MED ORDER — POTASSIUM CHLORIDE CRYS ER 10 MEQ PO TBCR: 10.0000 meq | EXTENDED_RELEASE_TABLET | Freq: Every day | ORAL | Status: DC

## 2017-08-03 MED ORDER — ONDANSETRON HCL 4 MG PO TABS: 4.0000 mg | ORAL_TABLET | Freq: Four times a day (QID) | ORAL | Status: DC | PRN

## 2017-08-03 MED ORDER — SODIUM CHLORIDE 0.9 % IV SOLN: 250.0000 mL | INTRAVENOUS | Status: DC | PRN

## 2017-08-03 MED ORDER — IRBESARTAN 75 MG PO TABS
75.0000 mg | ORAL_TABLET | Freq: Every day | ORAL | Status: DC
  Filled 2017-08-03: qty 1

## 2017-08-03 MED ORDER — RISAQUAD PO CAPS
2.0000 | ORAL_CAPSULE | Freq: Every day | ORAL | Status: DC
  Administered 2017-08-04 – 2017-08-06 (×3): 2 via ORAL
  Filled 2017-08-03 (×3): qty 2

## 2017-08-03 MED ORDER — SODIUM CHLORIDE 0.9 % IV BOLUS
1000.0000 mL | Freq: Once | INTRAVENOUS | Status: AC
  Administered 2017-08-03: 1000 mL via INTRAVENOUS

## 2017-08-03 MED ORDER — HEPARIN (PORCINE) IN NACL 100-0.45 UNIT/ML-% IJ SOLN
1200.0000 [IU]/h | INTRAMUSCULAR | Status: DC
  Administered 2017-08-04: 1200 [IU]/h via INTRAVENOUS
  Administered 2017-08-04: 1000 [IU]/h via INTRAVENOUS
  Filled 2017-08-03 (×3): qty 250

## 2017-08-03 MED ORDER — AMLODIPINE BESYLATE 10 MG PO TABS
10.0000 mg | ORAL_TABLET | Freq: Every day | ORAL | Status: DC
  Administered 2017-08-04: 10 mg via ORAL
  Filled 2017-08-03: qty 1

## 2017-08-03 MED ORDER — BISACODYL 10 MG RE SUPP: 10.0000 mg | Freq: Every day | RECTAL | Status: DC | PRN

## 2017-08-03 MED ORDER — CLINDAMYCIN PHOSPHATE 900 MG/50ML IV SOLN
900.0000 mg | Freq: Once | INTRAVENOUS | Status: AC
  Administered 2017-08-03: 900 mg via INTRAVENOUS
  Filled 2017-08-03: qty 50

## 2017-08-03 MED ORDER — IOPAMIDOL (ISOVUE-370) INJECTION 76%
INTRAVENOUS | Status: AC
  Filled 2017-08-03: qty 100

## 2017-08-03 MED ORDER — SENNA 8.6 MG PO TABS
1.0000 | ORAL_TABLET | Freq: Two times a day (BID) | ORAL | Status: DC
  Administered 2017-08-04 – 2017-08-06 (×6): 8.6 mg via ORAL
  Filled 2017-08-03 (×6): qty 1

## 2017-08-03 MED ORDER — CLINDAMYCIN PHOSPHATE 600 MG/50ML IV SOLN
600.0000 mg | Freq: Three times a day (TID) | INTRAVENOUS | Status: DC
  Administered 2017-08-04 – 2017-08-06 (×7): 600 mg via INTRAVENOUS
  Filled 2017-08-03 (×7): qty 50

## 2017-08-03 MED ORDER — ONDANSETRON HCL 4 MG/2ML IJ SOLN: 4.0000 mg | Freq: Four times a day (QID) | INTRAMUSCULAR | Status: DC | PRN

## 2017-08-03 MED ORDER — LATANOPROST 0.005 % OP SOLN
1.0000 [drp] | Freq: Every day | OPHTHALMIC | Status: DC
  Administered 2017-08-04 – 2017-08-05 (×3): 1 [drp] via OPHTHALMIC
  Filled 2017-08-03: qty 2.5

## 2017-08-03 MED ORDER — TIMOLOL MALEATE 0.5 % OP SOLN
1.0000 [drp] | Freq: Two times a day (BID) | OPHTHALMIC | Status: DC
  Administered 2017-08-04 – 2017-08-06 (×5): 1 [drp] via OPHTHALMIC
  Filled 2017-08-03 (×2): qty 5

## 2017-08-03 MED ORDER — FUROSEMIDE 20 MG PO TABS: 20.0000 mg | ORAL_TABLET | Freq: Every day | ORAL | Status: DC

## 2017-08-03 MED ORDER — MOMETASONE FURO-FORMOTEROL FUM 100-5 MCG/ACT IN AERO
2.0000 | INHALATION_SPRAY | Freq: Two times a day (BID) | RESPIRATORY_TRACT | Status: DC
  Administered 2017-08-04 – 2017-08-06 (×5): 2 via RESPIRATORY_TRACT
  Filled 2017-08-03: qty 8.8

## 2017-08-03 MED ORDER — IOPAMIDOL (ISOVUE-370) INJECTION 76%
100.0000 mL | Freq: Once | INTRAVENOUS | Status: AC | PRN
  Administered 2017-08-03: 100 mL via INTRAVENOUS

## 2017-08-03 MED ORDER — TRAMADOL HCL 50 MG PO TABS
50.0000 mg | ORAL_TABLET | Freq: Four times a day (QID) | ORAL | Status: DC | PRN
  Administered 2017-08-04: 50 mg via ORAL
  Filled 2017-08-03: qty 1

## 2017-08-03 MED ORDER — BISACODYL 10 MG RE SUPP
10.0000 mg | Freq: Once | RECTAL | Status: AC
  Administered 2017-08-03: 10 mg via RECTAL
  Filled 2017-08-03: qty 1

## 2017-08-03 MED ORDER — MAGNESIUM CITRATE PO SOLN
1.0000 | Freq: Once | ORAL | Status: DC | PRN
Start: 2017-08-03 — End: 2017-08-06

## 2017-08-03 MED ORDER — HEPARIN BOLUS VIA INFUSION
3000.0000 [IU] | Freq: Once | INTRAVENOUS | Status: AC
  Administered 2017-08-04: 3000 [IU] via INTRAVENOUS
  Filled 2017-08-03: qty 3000

## 2017-08-03 MED ORDER — ROSUVASTATIN CALCIUM 5 MG PO TABS
5.0000 mg | ORAL_TABLET | Freq: Every day | ORAL | Status: DC
  Administered 2017-08-04 – 2017-08-05 (×3): 5 mg via ORAL
  Filled 2017-08-03 (×3): qty 1

## 2017-08-03 MED ORDER — POLYETHYLENE GLYCOL 3350 17 G PO PACK: 17.0000 g | PACK | Freq: Every day | ORAL | Status: DC | PRN

## 2017-08-03 MED ORDER — INSULIN ASPART 100 UNIT/ML ~~LOC~~ SOLN: 0.0000 [IU] | Freq: Three times a day (TID) | SUBCUTANEOUS | Status: DC

## 2017-08-03 MED ORDER — SODIUM CHLORIDE 0.9% FLUSH
3.0000 mL | Freq: Two times a day (BID) | INTRAVENOUS | Status: DC
  Administered 2017-08-04 – 2017-08-06 (×3): 3 mL via INTRAVENOUS

## 2017-08-03 MED ORDER — NAPROXEN 250 MG PO TABS
250.0000 mg | ORAL_TABLET | Freq: Two times a day (BID) | ORAL | Status: DC | PRN
  Administered 2017-08-04 (×2): 500 mg via ORAL
  Filled 2017-08-03 (×2): qty 2

## 2017-08-03 NOTE — ED Notes (Signed)
[place] sandwich and sprite given to pt.

## 2017-08-03 NOTE — ED Notes (Signed)
Patient transported to vascular. 

## 2017-08-03 NOTE — ED Provider Notes (Addendum)
[facility] EMERGENCY DEPARTMENT Provider Note   CSN: [id number] Arrival date & time: 08/03/17  1507     History   Chief Complaint Chief Complaint  Patient presents with  . Leg Pain    HPI Thomas Ramos is a 47 y.o. male.  HPI 47 yo M with h/o T2DM, HTN, GERD here with .  Persistent right leg swelling.  The patient was just admitted for similar symptoms.  He was initially on doxycycline and then switched to oral Keflex.  He had no significant improvement, but no worsening was sent home.  He is presented to his doctor again today with ongoing significant right leg pain.  He had an ultrasound that was inconclusive during that admission.  He states that since discharge, he has not had any significant improvement in his leg pain.  He has noticed some mild increase swelling as well.  He has had increased edema of the leg.  He has had occasional shortness of breath.  No syncope.  No cough or hemoptysis.  He went back to his doctor today and was sent back for repeat ultrasound due to concern for DVT.  Patient is paraplegic at baseline and is mobile.  He has severe bilateral contractures of the legs.  No known history of blood clots.  He does have history of recurrent cellulitis.  Denies any fevers or chills.  No appetite changes.  No other new complaints.  No new trauma.   Past Medical History:  Diagnosis Date  . Anxiety   . Cerebral palsy ([place])   . Chronic diastolic (congestive) heart failure ([place])   . CP (cerebral palsy) ([place])   . Diabetes mellitus without complication (HCC)    borderline  . Environmental allergies    takes inhalers if needed  . Esophageal stricture   . GERD (gastroesophageal reflux disease)   . Hypertension   . Motility disorder, esophageal   . Pneumonia   . 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 S/P Botox injection    approx every 4 months  . Seasonal allergies     Patient Active Problem List   Diagnosis Date Noted  . Cellulitis of right  lower extremity without foot 08/03/2017  . Type 2 diabetes mellitus with complication, without long-term current use of insulin ([place]) 08/03/2017  . PNA (pneumonia) 05/07/2017  . SIRS (systemic inflammatory response syndrome) (HCC)   . Acute respiratory failure with hypoxia ([place]) 03/13/2017  . 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 Rhinovirus infection 08/17/2016  . Pressure injury of skin 08/16/2016  . Acute respiratory distress 08/15/2016  . Tachycardia 08/15/2016  . Sepsis ([place]) 08/15/2016  . Onychomycosis 02/20/2016  . Essential hypertension   . OSA (obstructive sleep apnea) 12/11/2015  . Chronic diastolic (congestive) heart failure ([place])   . Chronic venous insufficiency   . Depression with anxiety 09/20/2015  . CAP (community acquired pneumonia) 09/20/2015  . Hypersomnia 07/16/2015  . Asthma exacerbation 07/16/2015  . GERD (gastroesophageal reflux disease) 07/16/2015  . Hematemesis with nausea   . Fever 05/24/2014  . Dysphagia 09/16/2010  . 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 BOTOX INJECTION N/A 12/21/2012   Procedure: BOTOX INJECTION;  Surgeon: Inda Castle, MD;  Location: [facility];  Service: Endoscopy;  Laterality: N/A;  . BOTOX INJECTION N/A 06/28/2014   Procedure: BOTOX INJECTION;  Surgeon: Inda Castle, MD;  Location: [facility];  Service: Endoscopy;  Laterality: N/A;  .  ESOPHAGOGASTRODUODENOSCOPY N/A 12/21/2012   Procedure: ESOPHAGOGASTRODUODENOSCOPY (EGD);  Surgeon: Inda Castle, MD;  Location: [facility];  Service: Endoscopy;  Laterality: N/A;  . ESOPHAGOGASTRODUODENOSCOPY N/A 06/28/2014   Procedure: ESOPHAGOGASTRODUODENOSCOPY (EGD);  Surgeon: Inda Castle, MD;  Location: [facility];  Service: Endoscopy;  Laterality: N/A;  . ESOPHAGUS SURGERY     stretched esophagus  . legs    . MOUTH SURGERY    . TENDON RELEASE          Home Medications    Prior to Admission medications     Medication Sig Start Date End Date Taking? Authorizing Provider  amLODipine (NORVASC) 10 MG tablet Take 10 mg by mouth at bedtime.    Yes [provider]  budesonide-formoterol (SYMBICORT) 80-4.5 MCG/ACT inhaler Inhale 2 puffs into the lungs 2 (two) times daily.   Yes [provider]  cephALEXin (KEFLEX) 500 MG capsule Take 1 capsule (500 mg total) by mouth every 8 (eight) hours. Patient taking differently: Take 500 mg by mouth every 8 (eight) hours. FOR 7 DAYS 07/29/17  Yes Patrecia Pour, MD  furosemide (LASIX) 20 MG tablet Take 20 mg by mouth daily. 07/15/17  Yes [provider]  latanoprost (XALATAN) 0.005 % ophthalmic solution Place 1 drop into both eyes at bedtime.   Yes [provider]  metFORMIN (GLUCOPHAGE-XR) 500 MG 24 hr tablet Take 1,000 mg by mouth at bedtime.  08/06/16 08/06/17 Yes [provider]  montelukast (SINGULAIR) 10 MG tablet Take 10 mg by mouth daily.  08/12/16  Yes [provider]  naproxen sodium (ALEVE) 220 MG tablet Take 220-440 mg by mouth 2 (two) times daily as needed (for pain).   Yes [provider]  ondansetron (ZOFRAN) 4 MG tablet Take 1 tablet (4 mg total) by mouth every 6 (six) hours. Patient taking differently: Take 4 mg by mouth every 6 (six) hours as needed for nausea or vomiting.  02/05/17  Yes Pollina, Gwenyth Allegra, MD  potassium chloride (K-DUR) 10 MEQ tablet Take 10 mEq by mouth daily.   Yes [provider]  rosuvastatin (CRESTOR) 5 MG tablet Take 5 mg by mouth at bedtime.   Yes [provider]  timolol (TIMOPTIC) 0.5 % ophthalmic solution Place 1 drop into both eyes every 12 (twelve) hours. 07/31/16  Yes [provider]  valsartan (DIOVAN) 80 MG tablet Take 80 mg by mouth daily.   Yes [provider]  sucralfate (CARAFATE) 1 GM/10ML suspension Take 10 mLs (1 g total) by mouth 4 (four) times daily -  with meals and at bedtime. Patient not taking: Reported on  09/20/2015 08/11/15 09/24/15  Randal Buba, April, MD    Family History Family History  Problem Relation Age of Onset  . Hypertension Father   . Lung cancer Father   . Diabetes Mother     Social History Social History   Tobacco Use  . Smoking status: Never Smoker  . Smokeless tobacco: Former Systems developer  . Tobacco comment: smoked one time  Substance Use Topics  . Alcohol use: Yes    Alcohol/week: 0.0 oz    Comment: 2 beers every other Friday and Saturday  . Drug use: No     Allergies   Penicillins; Sulfamethoxazole-trimethoprim; and Metronidazole   Review of Systems Review of Systems  Constitutional: Negative for chills, fatigue and fever.  HENT: Negative for congestion and rhinorrhea.   Eyes: Negative for visual disturbance.  Respiratory: Positive for shortness of breath. Negative for cough and wheezing.  Cardiovascular: Positive for chest pain and leg swelling.  Gastrointestinal: Negative for abdominal pain, diarrhea, nausea and vomiting.  Genitourinary: Negative for dysuria and flank pain.  Musculoskeletal: Negative for neck pain and neck stiffness.  Skin: Negative for rash and wound.  Allergic/Immunologic: Negative for immunocompromised state.  Neurological: Negative for syncope, weakness and headaches.  All other systems reviewed and are negative.    Physical Exam Updated Vital Signs BP (!) 109/44   Pulse 84   Temp 99.2 F (37.3 C) (Rectal)   Resp (!) 24   SpO2 96%   Physical Exam  Constitutional: He appears well-developed and well-nourished. No distress.  HENT:  Head: Normocephalic and atraumatic.  Mouth/Throat: Oropharynx is clear and moist.  Eyes: Conjunctivae are normal.  Neck: Neck supple.  Cardiovascular: Normal rate, regular rhythm and normal heart sounds. Exam reveals no friction rub.  No murmur heard. Pulmonary/Chest: Effort normal and breath sounds normal. No respiratory distress. He has no wheezes. He has no rales.  Abdominal: He exhibits no  distension.  Musculoskeletal: He exhibits no edema.  Neurological: He is alert. He exhibits abnormal muscle tone.  Bilateral contractures with increased tone  Skin: Skin is warm. Capillary refill takes less than 2 seconds.  Superficial abrasion right upper lower leg, no induration or fluctuance.  Psychiatric: He has a normal mood and affect.  Nursing note and vitals reviewed.    ED Treatments / Results  Labs (all labs ordered are listed, but only abnormal results are displayed) Labs Reviewed  CBC WITH DIFFERENTIAL/PLATELET - Abnormal; Notable for the following components:      Result Value   Hemoglobin 12.5 (*)    HCT 38.4 (*)    MCH 25.8 (*)    All other components within normal limits  COMPREHENSIVE METABOLIC PANEL - Abnormal; Notable for the following components:   Glucose, Bld 133 (*)    All other components within normal limits  D-DIMER, QUANTITATIVE (NOT AT [facility]) - Abnormal; Notable for the following components:   D-Dimer, Quant 0.60 (*)    All other components within normal limits  CULTURE, BLOOD (ROUTINE X 2)  CULTURE, BLOOD (ROUTINE X 2)  PROTIME-INR  BRAIN NATRIURETIC PEPTIDE  BASIC METABOLIC PANEL  CBC  HEPARIN LEVEL (UNFRACTIONATED)  CBC  I-STAT CG4 LACTIC ACID, ED    EKG None  Radiology Ct Angio Chest Pe W Or Wo Contrast  Result Date: 08/03/2017 CLINICAL DATA:  47 year old male with cellulitis around a scab on the right leg EXAM: CT ANGIOGRAPHY CHEST CT PELVIS WITH CONTRAST CT RIGHT LOWER EXTREMITY WITH CONTRAST TECHNIQUE: Multidetector CT imaging of the chest was performed using the standard protocol during bolus administration of intravenous contrast. Multiplanar CT image reconstructions and MIPs were obtained to evaluate the vascular anatomy. Multidetector CT imaging of the pelvis was then performed using the standard protocol during bolus administration of intravenous contrast. Additional spiral CT images were performed of the right lower extremity after  the imaging of the chest in abdomen. Reformatted images were performed on a separate workstation CONTRAST:  165mL ISOVUE-370 IOPAMIDOL (ISOVUE-370) INJECTION 76% COMPARISON:  None. FINDINGS: CTA CHEST FINDINGS Cardiovascular: Heart: No cardiomegaly. No pericardial fluid/thickening. No significant coronary calcifications. Aorta: Unremarkable course, caliber, contour of the thoracic aorta. No aneurysm or dissection flap. No periaortic fluid. Pulmonary arteries: No central, lobar, segmental, or proximal subsegmental filling defects. Mediastinum/Nodes: No mediastinal adenopathy. Essentially intrathoracic stomach. Unremarkable appearance of the thoracic inlet Lungs/Pleura: Geographic ground-glass opacity throughout the lungs. No pneumothorax or pleural effusion. Subpleural reticulation in  the posterior aspects of the lower lungs. No endotracheal or endobronchial debris. Musculoskeletal: No acute displaced fracture. Degenerative changes of the spine. Review of the MIP images confirms the above findings. CT PELVIS FINDINGS Bowel: Large stool burden of the visualized colon, particularly the sigmoid colon and rectum. Questionable inflammatory changes at the anterior rectum, at the interface of the urinary bladder, with poorly defined fat plane. Vascular/Lymphatic: Small caliber distal aorta and bilateral iliac arteries which remain patent. Reproductive: Unremarkable appearance of the visualized prostate. Other: Fat containing umbilical hernia, with loop of colon intimately associated with the hernia sac. No inflammatory changes. Musculoskeletal: No acute displaced fracture. Chronic changes of the bilateral hips, particularly on the left of hip dysplasia. CT RIGHT LOWER EXTREMITY Vasculature: Small caliber right common femoral artery and profunda femoris. Superficial femoral artery is patent to the knee without significant atherosclerotic changes. No significant atherosclerotic calcifications of the popliteal artery or the  tibial arteries. The timing of the contrast bolus is not optimized for evaluation of the arteries. Unremarkable appearance of the right lower extremity superficial veins. Right-sided deep veins are not well evaluated on the current study. Musculoskeletal: Fat attenuation of the musculature of the right lower extremity. Circumferential edema/swelling of the right lower extremity below the knee. No myofacial or subcutaneous gas. No acute displaced fracture.  No significant degenerative changes. Review of the MIP images confirms the above findings. IMPRESSION: Chest: CT angiogram is negative for pulmonary emboli. Geographic ground-glass throughout the lungs, frequently seen with either small vessel disease or small airway disease. Edema is favored less likely given the absence of pleural effusion and interlobular septal thickening. Hiatal hernia with essentially intrathoracic stomach. Abdomen: Fecal impaction with questionable developing stercoral colitis at the anterior rectum. Right lower extremity: Nonspecific circumferential swelling/edema of the right lower leg below the knee. No evidence of myofacial or subcutaneous gas to indicate necrotizing infection. Electronically Signed   By: Corrie Mckusick D.O.   On: 08/03/2017 20:19   Ct Pelvis W Contrast  Result Date: 08/03/2017 CLINICAL DATA:  48 year old male with cellulitis around a scab on the right leg EXAM: CT ANGIOGRAPHY CHEST CT PELVIS WITH CONTRAST CT RIGHT LOWER EXTREMITY WITH CONTRAST TECHNIQUE: Multidetector CT imaging of the chest was performed using the standard protocol during bolus administration of intravenous contrast. Multiplanar CT image reconstructions and MIPs were obtained to evaluate the vascular anatomy. Multidetector CT imaging of the pelvis was then performed using the standard protocol during bolus administration of intravenous contrast. Additional spiral CT images were performed of the right lower extremity after the imaging of the chest  in abdomen. Reformatted images were performed on a separate workstation CONTRAST:  170mL ISOVUE-370 IOPAMIDOL (ISOVUE-370) INJECTION 76% COMPARISON:  None. FINDINGS: CTA CHEST FINDINGS Cardiovascular: Heart: No cardiomegaly. No pericardial fluid/thickening. No significant coronary calcifications. Aorta: Unremarkable course, caliber, contour of the thoracic aorta. No aneurysm or dissection flap. No periaortic fluid. Pulmonary arteries: No central, lobar, segmental, or proximal subsegmental filling defects. Mediastinum/Nodes: No mediastinal adenopathy. Essentially intrathoracic stomach. Unremarkable appearance of the thoracic inlet Lungs/Pleura: Geographic ground-glass opacity throughout the lungs. No pneumothorax or pleural effusion. Subpleural reticulation in the posterior aspects of the lower lungs. No endotracheal or endobronchial debris. Musculoskeletal: No acute displaced fracture. Degenerative changes of the spine. Review of the MIP images confirms the above findings. CT PELVIS FINDINGS Bowel: Large stool burden of the visualized colon, particularly the sigmoid colon and rectum. Questionable inflammatory changes at the anterior rectum, at the interface of the urinary bladder, with poorly defined  fat plane. Vascular/Lymphatic: Small caliber distal aorta and bilateral iliac arteries which remain patent. Reproductive: Unremarkable appearance of the visualized prostate. Other: Fat containing umbilical hernia, with loop of colon intimately associated with the hernia sac. No inflammatory changes. Musculoskeletal: No acute displaced fracture. Chronic changes of the bilateral hips, particularly on the left of hip dysplasia. CT RIGHT LOWER EXTREMITY Vasculature: Small caliber right common femoral artery and profunda femoris. Superficial femoral artery is patent to the knee without significant atherosclerotic changes. No significant atherosclerotic calcifications of the popliteal artery or the tibial arteries. The timing  of the contrast bolus is not optimized for evaluation of the arteries. Unremarkable appearance of the right lower extremity superficial veins. Right-sided deep veins are not well evaluated on the current study. Musculoskeletal: Fat attenuation of the musculature of the right lower extremity. Circumferential edema/swelling of the right lower extremity below the knee. No myofacial or subcutaneous gas. No acute displaced fracture.  No significant degenerative changes. Review of the MIP images confirms the above findings. IMPRESSION: Chest: CT angiogram is negative for pulmonary emboli. Geographic ground-glass throughout the lungs, frequently seen with either small vessel disease or small airway disease. Edema is favored less likely given the absence of pleural effusion and interlobular septal thickening. Hiatal hernia with essentially intrathoracic stomach. Abdomen: Fecal impaction with questionable developing stercoral colitis at the anterior rectum. Right lower extremity: Nonspecific circumferential swelling/edema of the right lower leg below the knee. No evidence of myofacial or subcutaneous gas to indicate necrotizing infection. Electronically Signed   By: Corrie Mckusick D.O.   On: 08/03/2017 20:19   Ct Extremity Lower Right W Contrast  Result Date: 08/03/2017 CLINICAL DATA:  47 year old male with cellulitis around a scab on the right leg EXAM: CT ANGIOGRAPHY CHEST CT PELVIS WITH CONTRAST CT RIGHT LOWER EXTREMITY WITH CONTRAST TECHNIQUE: Multidetector CT imaging of the chest was performed using the standard protocol during bolus administration of intravenous contrast. Multiplanar CT image reconstructions and MIPs were obtained to evaluate the vascular anatomy. Multidetector CT imaging of the pelvis was then performed using the standard protocol during bolus administration of intravenous contrast. Additional spiral CT images were performed of the right lower extremity after the imaging of the chest in abdomen.  Reformatted images were performed on a separate workstation CONTRAST:  138mL ISOVUE-370 IOPAMIDOL (ISOVUE-370) INJECTION 76% COMPARISON:  None. FINDINGS: CTA CHEST FINDINGS Cardiovascular: Heart: No cardiomegaly. No pericardial fluid/thickening. No significant coronary calcifications. Aorta: Unremarkable course, caliber, contour of the thoracic aorta. No aneurysm or dissection flap. No periaortic fluid. Pulmonary arteries: No central, lobar, segmental, or proximal subsegmental filling defects. Mediastinum/Nodes: No mediastinal adenopathy. Essentially intrathoracic stomach. Unremarkable appearance of the thoracic inlet Lungs/Pleura: Geographic ground-glass opacity throughout the lungs. No pneumothorax or pleural effusion. Subpleural reticulation in the posterior aspects of the lower lungs. No endotracheal or endobronchial debris. Musculoskeletal: No acute displaced fracture. Degenerative changes of the spine. Review of the MIP images confirms the above findings. CT PELVIS FINDINGS Bowel: Large stool burden of the visualized colon, particularly the sigmoid colon and rectum. Questionable inflammatory changes at the anterior rectum, at the interface of the urinary bladder, with poorly defined fat plane. Vascular/Lymphatic: Small caliber distal aorta and bilateral iliac arteries which remain patent. Reproductive: Unremarkable appearance of the visualized prostate. Other: Fat containing umbilical hernia, with loop of colon intimately associated with the hernia sac. No inflammatory changes. Musculoskeletal: No acute displaced fracture. Chronic changes of the bilateral hips, particularly on the left of hip dysplasia. CT RIGHT LOWER EXTREMITY Vasculature: Small  caliber right common femoral artery and profunda femoris. Superficial femoral artery is patent to the knee without significant atherosclerotic changes. No significant atherosclerotic calcifications of the popliteal artery or the tibial arteries. The timing of the  contrast bolus is not optimized for evaluation of the arteries. Unremarkable appearance of the right lower extremity superficial veins. Right-sided deep veins are not well evaluated on the current study. Musculoskeletal: Fat attenuation of the musculature of the right lower extremity. Circumferential edema/swelling of the right lower extremity below the knee. No myofacial or subcutaneous gas. No acute displaced fracture.  No significant degenerative changes. Review of the MIP images confirms the above findings. IMPRESSION: Chest: CT angiogram is negative for pulmonary emboli. Geographic ground-glass throughout the lungs, frequently seen with either small vessel disease or small airway disease. Edema is favored less likely given the absence of pleural effusion and interlobular septal thickening. Hiatal hernia with essentially intrathoracic stomach. Abdomen: Fecal impaction with questionable developing stercoral colitis at the anterior rectum. Right lower extremity: Nonspecific circumferential swelling/edema of the right lower leg below the knee. No evidence of myofacial or subcutaneous gas to indicate necrotizing infection. Electronically Signed   By: Corrie Mckusick D.O.   On: 08/03/2017 20:19    Procedures .Critical Care Performed by: Duffy Bruce, MD Authorized by: Duffy Bruce, MD   Critical care provider statement:    Critical care time (minutes):  35   Critical care time was exclusive of:  Separately billable procedures and treating other patients and teaching time   Critical care was necessary to treat or prevent imminent or life-threatening deterioration of the following conditions:  Sepsis, cardiac failure, circulatory failure and respiratory failure   Critical care was time spent personally by me on the following activities:  Development of treatment plan with patient or surrogate, discussions with consultants, evaluation of patient's response to treatment, examination of patient, obtaining  history from patient or surrogate, ordering and performing treatments and interventions, ordering and review of laboratory studies, ordering and review of radiographic studies, pulse oximetry, re-evaluation of patient's condition and review of old charts   I assumed direction of critical care for this patient from another provider in my specialty: no     (including critical care time)  Medications Ordered in ED Medications  iopamidol (ISOVUE-370) 76 % injection (has no administration in time range)  amLODipine (NORVASC) tablet 10 mg (has no administration in time range)  mometasone-formoterol (DULERA) 100-5 MCG/ACT inhaler 2 puff (2 puffs Inhalation Not Given 08/04/17 0015)  furosemide (LASIX) tablet 20 mg (has no administration in time range)  latanoprost (XALATAN) 0.005 % ophthalmic solution 1 drop (has no administration in time range)  montelukast (SINGULAIR) tablet 10 mg (has no administration in time range)  naproxen (NAPROSYN) tablet 250-500 mg (has no administration in time range)  potassium chloride (K-DUR,KLOR-CON) CR tablet 10 mEq (has no administration in time range)  rosuvastatin (CRESTOR) tablet 5 mg (has no administration in time range)  timolol (TIMOPTIC) 0.5 % ophthalmic solution 1 drop (has no administration in time range)  irbesartan (AVAPRO) tablet 75 mg (has no administration in time range)  0.9 % NaCl with KCl 20 mEq/ L  infusion (has no administration in time range)  sodium chloride flush (NS) 0.9 % injection 3 mL (has no administration in time range)  sodium chloride flush (NS) 0.9 % injection 3 mL (has no administration in time range)  0.9 %  sodium chloride infusion (has no administration in time range)  traMADol (ULTRAM) tablet 50 mg (has  no administration in time range)  senna (SENOKOT) tablet 8.6 mg (has no administration in time range)  polyethylene glycol (MIRALAX / GLYCOLAX) packet 17 g (has no administration in time range)  bisacodyl (DULCOLAX) suppository 10  mg (has no administration in time range)  magnesium citrate solution 1 Bottle (has no administration in time range)  ondansetron (ZOFRAN) tablet 4 mg (has no administration in time range)    Or  ondansetron (ZOFRAN) injection 4 mg (has no administration in time range)  insulin aspart (novoLOG) injection 0-15 Units (has no administration in time range)  acidophilus (RISAQUAD) capsule 2 capsule (has no administration in time range)  clindamycin (CLEOCIN) IVPB 600 mg (has no administration in time range)  heparin bolus via infusion 3,000 Units (has no administration in time range)  heparin ADULT infusion 100 units/mL (25000 units/27mL sodium chloride 0.45%) (has no administration in time range)  iopamidol (ISOVUE-370) 76 % injection 100 mL (100 mLs Intravenous Contrast Given 08/03/17 1900)  sodium chloride 0.9 % bolus 1,000 mL (0 mLs Intravenous Stopped 08/03/17 2243)  clindamycin (CLEOCIN) IVPB 900 mg (0 mg Intravenous Stopped 08/03/17 2243)  bisacodyl (DULCOLAX) suppository 10 mg (10 mg Rectal Given 08/03/17 2201)     Initial Impression / Assessment and Plan / ED Course  I have reviewed the triage vital signs and the nursing notes.  Pertinent labs & imaging results that were available during my care of the patient were reviewed by me and considered in my medical decision making (see chart for details).    47 year old male here with recurrent right leg swelling and pain.  Sent by his PCP due to recurrence of redness and swelling after recent course of Keflex.  He was hospitalized during that stay.  On my assessment, patient initially with only minimal erythema overlying the superficial abrasion.  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  Ultrasound shows no DVT but is markedly limited.  He does have positive d-dimer.  Discussed with radiology and decision made to obtain CT of the lower extremity in addition a CT angios to evaluate possible clots.  Fortunately, these show no evidence of  clot.  He does have a mild stool impaction but has had a bowel movement here and will give him a suppository.  No abdominal pain or tenderness on exam.  While in the emergency room, the patient has had increasing tachycardia as well as concern for increasing redness of the right lower extremity. Will treat as cellulitis, admit for further evaluation and treatment.  Final Clinical Impressions(s) / ED Diagnoses   Final diagnoses:  Cellulitis of right lower extremity    ED Discharge Orders    None       Duffy Bruce, MD 08/04/17 Justice Rocher, MD 08/16/17 1244

## 2017-08-03 NOTE — ED Notes (Signed)
Patient transported to CT 

## 2017-08-03 NOTE — Progress Notes (Signed)
ANTICOAGULATION CONSULT NOTE - Initial Consult  Pharmacy Consult for Heparin Indication: DVT  Allergies  Allergen Reactions  . Penicillins Hives, Nausea And Vomiting and Other (See Comments)    Patient tolerated cefazolin in 2017 Has patient had a PCN reaction causing immediate rash, facial/tongue/throat swelling, SOB or lightheadedness with hypotension: Yes Has patient had a PCN reaction causing severe rash involving mucus membranes or skin necrosis: No Has patient had a PCN reaction that required hospitalization No Has patient had a PCN reaction occurring within the last 10 years: Yes If all of the above answers are "NO", then may proceed with Cephalosporin use.  . Sulfamethoxazole-Trimethoprim Nausea And Vomiting  . Metronidazole Nausea And Vomiting    Patient Measurements:   Heparin Dosing Weight: 65 kg  Vital Signs: Temp: 99.2 F (37.3 C) (05/21 2124) Temp Source: Rectal (05/21 2124) BP: 111/69 (05/21 2315) Pulse Rate: 89 (05/21 2315)  Labs: Recent Labs    08/03/17 1547  HGB 12.5*  HCT 38.4*  PLT 260  LABPROT 13.2  INR 1.01  CREATININE 0.88    Estimated Creatinine Clearance: 92.7 mL/min (by C-G formula based on SCr of 0.88 mg/dL).   Medical History: Past Medical History:  Diagnosis Date  . Anxiety   . Cerebral palsy ([place])   . Chronic diastolic (congestive) heart failure ([place])   . CP (cerebral palsy) ([place])   . Diabetes mellitus without complication ([facility])    borderline  . Environmental allergies    takes inhalers if needed  . Esophageal stricture   . GERD (gastroesophageal reflux disease)   . Hypertension   . Motility disorder, esophageal   . Pneumonia   . 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 S/P Botox injection    approx every 4 months  . Seasonal allergies     Medications:  No current facility-administered medications on file prior to encounter.    Current Outpatient Medications on File Prior to Encounter  Medication Sig Dispense Refill   . amLODipine (NORVASC) 10 MG tablet Take 10 mg by mouth at bedtime.     . budesonide-formoterol (SYMBICORT) 80-4.5 MCG/ACT inhaler Inhale 2 puffs into the lungs 2 (two) times daily.    . cephALEXin (KEFLEX) 500 MG capsule Take 1 capsule (500 mg total) by mouth every 8 (eight) hours. (Patient taking differently: Take 500 mg by mouth every 8 (eight) hours. FOR 7 DAYS) 21 capsule 0  . furosemide (LASIX) 20 MG tablet Take 20 mg by mouth daily.  1  . latanoprost (XALATAN) 0.005 % ophthalmic solution Place 1 drop into both eyes at bedtime.    . metFORMIN (GLUCOPHAGE-XR) 500 MG 24 hr tablet Take 1,000 mg by mouth at bedtime.     . montelukast (SINGULAIR) 10 MG tablet Take 10 mg by mouth daily.     . naproxen sodium (ALEVE) 220 MG tablet Take 220-440 mg by mouth 2 (two) times daily as needed (for pain).    . ondansetron (ZOFRAN) 4 MG tablet Take 1 tablet (4 mg total) by mouth every 6 (six) hours. (Patient taking differently: Take 4 mg by mouth every 6 (six) hours as needed for nausea or vomiting. ) 12 tablet 0  . potassium chloride (K-DUR) 10 MEQ tablet Take 10 mEq by mouth daily.    . rosuvastatin (CRESTOR) 5 MG tablet Take 5 mg by mouth at bedtime.    . timolol (TIMOPTIC) 0.5 % ophthalmic solution Place 1 drop into both eyes every 12 (twelve) hours.  99  . valsartan (DIOVAN) 80  MG tablet Take 80 mg by mouth daily.  3  . [DISCONTINUED] sucralfate (CARAFATE) 1 GM/10ML suspension Take 10 mLs (1 g total) by mouth 4 (four) times daily -  with meals and at bedtime. (Patient not taking: Reported on 09/20/2015) 420 mL 0     Assessment: 47 y.o. male with RLE pain/swelling, possible DVT, for heparin  Goal of Therapy:  Heparin level 0.3-0.7 units/ml Monitor platelets by anticoagulation protocol: Yes   Plan:  Heparin 3000 units IV bolus, then start heparin 1000 units/hr Check heparin level in 8 hours.   Caryl Pina 08/03/2017,11:36 PM

## 2017-08-03 NOTE — ED Notes (Signed)
ED Provider at bedside. 

## 2017-08-03 NOTE — ED Triage Notes (Signed)
Pt brought in by EMS for C/O cellulitis around a scab to the right leg  ;  Has finished 2 courses of Abx and leg is no better: PT had same complaint last year but to the left leg and became septic

## 2017-08-03 NOTE — ED Notes (Signed)
Pharmacy messaged about unverified meds 

## 2017-08-03 NOTE — H&P (Signed)
History and Physical    Thomas Ramos VVO:[id number] DOB: 06-Jun-1970 DOA: 08/03/2017  PCP: Chesley Noon, MD   Patient coming from: Home via EMS  I have personally briefly reviewed patient's old medical records in [place]  Chief Complaint: Right lower extremity pain and swelling  HPI: Thomas Ramos is a 47 y.o. male with medical history significant of cerebral palsy, diabetes mellitus type 2, anxiety, gastroesophageal reflux disease, hypertension, quadriplegic spinal paralysis, and cellulitis of the right lower extremity recently treated with antibiotics both orally and IV without significant improvement.  Patient presents emergency department today complaining of worsening erythema and discomfort around a scab to the right leg.  He has been on oral antibiotics as well as IV antibiotics in the leg is no better and in fact is worsening during the course of his stay in the emergency department.  Patient is concerned because he had the same complaint last year to the left leg and ultimately became septic with it.  His presentation is very odd as he has no fever and no elevated white blood cell count.  Dated by ultrasound but as been the case in the past, due to his contractures they are unable to get a good test.  A CTA of the chest and the right lower extremity were obtained where no PEs were noted but it was difficult to ascertain whether there was clot in the right lower extremity based on CT with contrast.  The patient is extremely tender in the right lower extremity but he has no fever.  There is a well demarcated eschar noted without any serous drainage or fluctuance about the area.  It is not firm.  She denies nausea, vomiting, diarrhea, constipation, headache, blurry vision, double vision, masses, nodules, chest pain, shortness of breath, cough, sputum production. ED Course: Ultrasound of the right lower extremity, CT scan of the right lower extremity with contrast, CT of the pelvis  with contrast and CT Angie of the chest to rule out pulmonary embolisms were all obtained.  CT scan did show fecal impaction but the patient has had a bowel movement since that scan.  He also was noted to have swelling of the right lower extremity.  But no evidence of abscess.  Review of Systems: As per HPI otherwise all other systems reviewed and  negative.    Past Medical History:  Diagnosis Date  . Anxiety   . Cerebral palsy ([place])   . Chronic diastolic (congestive) heart failure ([place])   . CP (cerebral palsy) ([place])   . Diabetes mellitus without complication (HCC)    borderline  . Environmental allergies    takes inhalers if needed  . Esophageal stricture   . GERD (gastroesophageal reflux disease)   . Hypertension   . Motility disorder, esophageal   . Pneumonia   . 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[place])   . S/P Botox injection    approx every 4 months  . Seasonal allergies     Past Surgical History:  Procedure Laterality Date  . BOTOX INJECTION N/A 12/21/2012   Procedure: BOTOX INJECTION;  Surgeon: Inda Castle, MD;  Location: [facility];  Service: Endoscopy;  Laterality: N/A;  . BOTOX INJECTION N/A 06/28/2014   Procedure: BOTOX INJECTION;  Surgeon: Inda Castle, MD;  Location: [place];  Service: Endoscopy;  Laterality: N/A;  . ESOPHAGOGASTRODUODENOSCOPY N/A 12/21/2012   Procedure: ESOPHAGOGASTRODUODENOSCOPY (EGD);  Surgeon: Inda Castle, MD;  Location: [facility];  Service: Endoscopy;  Laterality: N/A;  .  ESOPHAGOGASTRODUODENOSCOPY N/A 06/28/2014   Procedure: ESOPHAGOGASTRODUODENOSCOPY (EGD);  Surgeon: Inda Castle, MD;  Location: [place];  Service: Endoscopy;  Laterality: N/A;  . ESOPHAGUS SURGERY     stretched esophagus  . legs    . MOUTH SURGERY    . TENDON RELEASE      Social History   Social History Narrative   ** Merged History Encounter **       Patient lives in a [phone]   Disabled.   Education- 6 th  grade   Left handed.   Caffeine- coffee one cup daily.           reports that he has never smoked. He has quit using smokeless tobacco. He reports that he drinks alcohol. He reports that he does not use drugs.  Allergies  Allergen Reactions  . Penicillins Hives, Nausea And Vomiting and Other (See Comments)    Patient tolerated cefazolin in 2017 Has patient had a PCN reaction causing immediate rash, facial/tongue/throat swelling, SOB or lightheadedness with hypotension: Yes Has patient had a PCN reaction causing severe rash involving mucus membranes or skin necrosis: No Has patient had a PCN reaction that required hospitalization No Has patient had a PCN reaction occurring within the last 10 years: Yes If all of the above answers are "NO", then may proceed with Cephalosporin use.  . Sulfamethoxazole-Trimethoprim Nausea And Vomiting  . Metronidazole Nausea And Vomiting    Family History  Problem Relation Age of Onset  . Hypertension Father   . Lung cancer Father   . Diabetes Mother      Prior to Admission medications   Medication Sig Start Date End Date Taking? Authorizing Provider  amLODipine (NORVASC) 10 MG tablet Take 10 mg by mouth at bedtime.    Yes [provider]  budesonide-formoterol (SYMBICORT) 80-4.5 MCG/ACT inhaler Inhale 2 puffs into the lungs 2 (two) times daily.   Yes [provider]  cephALEXin (KEFLEX) 500 MG capsule Take 1 capsule (500 mg total) by mouth every 8 (eight) hours. Patient taking differently: Take 500 mg by mouth every 8 (eight) hours. FOR 7 DAYS 07/29/17  Yes Patrecia Pour, MD  furosemide (LASIX) 20 MG tablet Take 20 mg by mouth daily. 07/15/17  Yes [provider]  latanoprost (XALATAN) 0.005 % ophthalmic solution Place 1 drop into both eyes at bedtime.   Yes [provider]  metFORMIN (GLUCOPHAGE-XR) 500 MG 24 hr tablet Take 1,000 mg by mouth at bedtime.  08/06/16 08/06/17 Yes [provider]  montelukast  (SINGULAIR) 10 MG tablet Take 10 mg by mouth daily.  08/12/16  Yes [provider]  naproxen sodium (ALEVE) 220 MG tablet Take 220-440 mg by mouth 2 (two) times daily as needed (for pain).   Yes [provider]  ondansetron (ZOFRAN) 4 MG tablet Take 1 tablet (4 mg total) by mouth every 6 (six) hours. Patient taking differently: Take 4 mg by mouth every 6 (six) hours as needed for nausea or vomiting.  02/05/17  Yes Pollina, Gwenyth Allegra, MD  potassium chloride (K-DUR) 10 MEQ tablet Take 10 mEq by mouth daily.   Yes [provider]  rosuvastatin (CRESTOR) 5 MG tablet Take 5 mg by mouth at bedtime.   Yes [provider]  timolol (TIMOPTIC) 0.5 % ophthalmic solution Place 1 drop into both eyes every 12 (twelve) hours. 07/31/16  Yes [provider]  valsartan (DIOVAN) 80 MG tablet Take 80 mg by mouth  daily.   Yes [provider]  sucralfate (CARAFATE) 1 GM/10ML suspension Take 10 mLs (1 g total) by mouth 4 (four) times daily -  with meals and at bedtime. Patient not taking: Reported on 09/20/2015 08/11/15 09/24/15  Randal Buba, April, MD    Physical Exam:  Constitutional: NAD, calm, comfortable Vitals:   08/03/17 2056 08/03/17 2124 08/03/17 2130 08/03/17 2215  BP: 111/76  109/70 114/62  Pulse: 94  89 84  Resp: 14  19 (!) 22  Temp: 97.6 F (36.4 C) 99.2 F (37.3 C)    TempSrc: Oral Rectal    SpO2: 95%  97% 95%   Eyes: PERRL, lids and conjunctivae normal ENMT: Mucous membranes are moist. Posterior pharynx clear of any exudate or lesions.Normal dentition.  Neck: normal, supple, no masses, no thyromegaly Respiratory: clear to auscultation bilaterally, no wheezing, no crackles. Normal respiratory effort. No accessory muscle use.  Cardiovascular: Regular rate and rhythm, no murmurs / rubs / gallops. No extremity edema. 2+ pedal pulses. No carotid bruits.  Abdomen: no tenderness, no masses palpated. No hepatosplenomegaly. Bowel sounds positive.    Musculoskeletal: no clubbing / cyanosis. No joint deformity upper and lower extremities. Good ROM, no contractures. Normal muscle tone.  Skin: Circumferential erythematous rash of the right lower extremity centered around a scab.  The leg is swollen edematous and erythematous.  It is warm to the touch.  Is painful to palpation., lesions, ulcers. No induration Neurologic: CN 2-12 grossly intact. Sensation intact, bilateral lower extremities contracted, speech garbled due to cerebral palsy Psychiatric: Normal judgment and insight. Alert and oriented x 3. Normal mood.    Labs on Admission: I have personally reviewed following labs and imaging studies  CBC: Recent Labs  Lab 07/28/17 0805 08/03/17 1547  WBC 4.7 5.8  NEUTROABS  --  3.3  HGB 11.6* 12.5*  HCT 35.9* 38.4*  MCV 81.0 79.3  PLT 186 235   Basic Metabolic Panel: Recent Labs  Lab 07/28/17 0805 08/03/17 1547  NA 138 137  K 4.4 4.0  CL 106 103  CO2 24 24  GLUCOSE 94 133*  BUN 13 14  CREATININE 0.67 0.88  CALCIUM 9.0 9.1   GFR: Estimated Creatinine Clearance: 92.7 mL/min (by C-G formula based on SCr of 0.88 mg/dL). Liver Function Tests: Recent Labs  Lab 08/03/17 1547  AST 16  ALT 19  ALKPHOS 120  BILITOT 0.4  PROT 6.9  ALBUMIN 3.9   Coagulation Profile: Recent Labs  Lab 08/03/17 1547  INR 1.01   CBG: Recent Labs  Lab 07/28/17 1129 07/28/17 1626 07/28/17 2210 07/29/17 0602  GLUCAP 91 159* 132* 95   Urine analysis:    Component Value Date/Time   COLORURINE STRAW (A) 05/07/2017 1403   APPEARANCEUR CLEAR 05/07/2017 1403   LABSPEC 1.006 05/07/2017 1403   PHURINE 6.0 05/07/2017 1403   GLUCOSEU NEGATIVE 05/07/2017 1403   HGBUR NEGATIVE 05/07/2017 1403   BILIRUBINUR NEGATIVE 05/07/2017 1403   KETONESUR NEGATIVE 05/07/2017 1403   PROTEINUR NEGATIVE 05/07/2017 1403   UROBILINOGEN 1.0 05/26/2014 2211   NITRITE NEGATIVE 05/07/2017 1403   LEUKOCYTESUR NEGATIVE 05/07/2017 1403    Radiological Exams on  Admission: Ct Angio Chest Pe W Or Wo Contrast  Result Date: 08/03/2017 CLINICAL DATA:  47 year old male with cellulitis around a scab on the right leg EXAM: CT ANGIOGRAPHY CHEST CT PELVIS WITH CONTRAST CT RIGHT LOWER EXTREMITY WITH CONTRAST TECHNIQUE: Multidetector CT imaging of the chest was performed using the standard protocol during bolus administration of intravenous contrast.  Multiplanar CT image reconstructions and MIPs were obtained to evaluate the vascular anatomy. Multidetector CT imaging of the pelvis was then performed using the standard protocol during bolus administration of intravenous contrast. Additional spiral CT images were performed of the right lower extremity after the imaging of the chest in abdomen. Reformatted images were performed on a separate workstation CONTRAST:  149mL ISOVUE-370 IOPAMIDOL (ISOVUE-370) INJECTION 76% COMPARISON:  None. FINDINGS: CTA CHEST FINDINGS Cardiovascular: Heart: No cardiomegaly. No pericardial fluid/thickening. No significant coronary calcifications. Aorta: Unremarkable course, caliber, contour of the thoracic aorta. No aneurysm or dissection flap. No periaortic fluid. Pulmonary arteries: No central, lobar, segmental, or proximal subsegmental filling defects. Mediastinum/Nodes: No mediastinal adenopathy. Essentially intrathoracic stomach. Unremarkable appearance of the thoracic inlet Lungs/Pleura: Geographic ground-glass opacity throughout the lungs. No pneumothorax or pleural effusion. Subpleural reticulation in the posterior aspects of the lower lungs. No endotracheal or endobronchial debris. Musculoskeletal: No acute displaced fracture. Degenerative changes of the spine. Review of the MIP images confirms the above findings. CT PELVIS FINDINGS Bowel: Large stool burden of the visualized colon, particularly the sigmoid colon and rectum. Questionable inflammatory changes at the anterior rectum, at the interface of the urinary bladder, with poorly defined fat  plane. Vascular/Lymphatic: Small caliber distal aorta and bilateral iliac arteries which remain patent. Reproductive: Unremarkable appearance of the visualized prostate. Other: Fat containing umbilical hernia, with loop of colon intimately associated with the hernia sac. No inflammatory changes. Musculoskeletal: No acute displaced fracture. Chronic changes of the bilateral hips, particularly on the left of hip dysplasia. CT RIGHT LOWER EXTREMITY Vasculature: Small caliber right common femoral artery and profunda femoris. Superficial femoral artery is patent to the knee without significant atherosclerotic changes. No significant atherosclerotic calcifications of the popliteal artery or the tibial arteries. The timing of the contrast bolus is not optimized for evaluation of the arteries. Unremarkable appearance of the right lower extremity superficial veins. Right-sided deep veins are not well evaluated on the current study. Musculoskeletal: Fat attenuation of the musculature of the right lower extremity. Circumferential edema/swelling of the right lower extremity below the knee. No myofacial or subcutaneous gas. No acute displaced fracture.  No significant degenerative changes. Review of the MIP images confirms the above findings. IMPRESSION: Chest: CT angiogram is negative for pulmonary emboli. Geographic ground-glass throughout the lungs, frequently seen with either small vessel disease or small airway disease. Edema is favored less likely given the absence of pleural effusion and interlobular septal thickening. Hiatal hernia with essentially intrathoracic stomach. Abdomen: Fecal impaction with questionable developing stercoral colitis at the anterior rectum. Right lower extremity: Nonspecific circumferential swelling/edema of the right lower leg below the knee. No evidence of myofacial or subcutaneous gas to indicate necrotizing infection. Electronically Signed   By: Corrie Mckusick D.O.   On: 08/03/2017 20:19    Ct Pelvis W Contrast  Result Date: 08/03/2017 CLINICAL DATA:  47 year old male with cellulitis around a scab on the right leg EXAM: CT ANGIOGRAPHY CHEST CT PELVIS WITH CONTRAST CT RIGHT LOWER EXTREMITY WITH CONTRAST TECHNIQUE: Multidetector CT imaging of the chest was performed using the standard protocol during bolus administration of intravenous contrast. Multiplanar CT image reconstructions and MIPs were obtained to evaluate the vascular anatomy. Multidetector CT imaging of the pelvis was then performed using the standard protocol during bolus administration of intravenous contrast. Additional spiral CT images were performed of the right lower extremity after the imaging of the chest in abdomen. Reformatted images were performed on a separate workstation CONTRAST:  110mL ISOVUE-370 IOPAMIDOL (ISOVUE-370)  INJECTION 76% COMPARISON:  None. FINDINGS: CTA CHEST FINDINGS Cardiovascular: Heart: No cardiomegaly. No pericardial fluid/thickening. No significant coronary calcifications. Aorta: Unremarkable course, caliber, contour of the thoracic aorta. No aneurysm or dissection flap. No periaortic fluid. Pulmonary arteries: No central, lobar, segmental, or proximal subsegmental filling defects. Mediastinum/Nodes: No mediastinal adenopathy. Essentially intrathoracic stomach. Unremarkable appearance of the thoracic inlet Lungs/Pleura: Geographic ground-glass opacity throughout the lungs. No pneumothorax or pleural effusion. Subpleural reticulation in the posterior aspects of the lower lungs. No endotracheal or endobronchial debris. Musculoskeletal: No acute displaced fracture. Degenerative changes of the spine. Review of the MIP images confirms the above findings. CT PELVIS FINDINGS Bowel: Large stool burden of the visualized colon, particularly the sigmoid colon and rectum. Questionable inflammatory changes at the anterior rectum, at the interface of the urinary bladder, with poorly defined fat plane.  Vascular/Lymphatic: Small caliber distal aorta and bilateral iliac arteries which remain patent. Reproductive: Unremarkable appearance of the visualized prostate. Other: Fat containing umbilical hernia, with loop of colon intimately associated with the hernia sac. No inflammatory changes. Musculoskeletal: No acute displaced fracture. Chronic changes of the bilateral hips, particularly on the left of hip dysplasia. CT RIGHT LOWER EXTREMITY Vasculature: Small caliber right common femoral artery and profunda femoris. Superficial femoral artery is patent to the knee without significant atherosclerotic changes. No significant atherosclerotic calcifications of the popliteal artery or the tibial arteries. The timing of the contrast bolus is not optimized for evaluation of the arteries. Unremarkable appearance of the right lower extremity superficial veins. Right-sided deep veins are not well evaluated on the current study. Musculoskeletal: Fat attenuation of the musculature of the right lower extremity. Circumferential edema/swelling of the right lower extremity below the knee. No myofacial or subcutaneous gas. No acute displaced fracture.  No significant degenerative changes. Review of the MIP images confirms the above findings. IMPRESSION: Chest: CT angiogram is negative for pulmonary emboli. Geographic ground-glass throughout the lungs, frequently seen with either small vessel disease or small airway disease. Edema is favored less likely given the absence of pleural effusion and interlobular septal thickening. Hiatal hernia with essentially intrathoracic stomach. Abdomen: Fecal impaction with questionable developing stercoral colitis at the anterior rectum. Right lower extremity: Nonspecific circumferential swelling/edema of the right lower leg below the knee. No evidence of myofacial or subcutaneous gas to indicate necrotizing infection. Electronically Signed   By: Corrie Mckusick D.O.   On: 08/03/2017 20:19   Ct  Extremity Lower Right W Contrast  Result Date: 08/03/2017 CLINICAL DATA:  47 year old male with cellulitis around a scab on the right leg EXAM: CT ANGIOGRAPHY CHEST CT PELVIS WITH CONTRAST CT RIGHT LOWER EXTREMITY WITH CONTRAST TECHNIQUE: Multidetector CT imaging of the chest was performed using the standard protocol during bolus administration of intravenous contrast. Multiplanar CT image reconstructions and MIPs were obtained to evaluate the vascular anatomy. Multidetector CT imaging of the pelvis was then performed using the standard protocol during bolus administration of intravenous contrast. Additional spiral CT images were performed of the right lower extremity after the imaging of the chest in abdomen. Reformatted images were performed on a separate workstation CONTRAST:  131mL ISOVUE-370 IOPAMIDOL (ISOVUE-370) INJECTION 76% COMPARISON:  None. FINDINGS: CTA CHEST FINDINGS Cardiovascular: Heart: No cardiomegaly. No pericardial fluid/thickening. No significant coronary calcifications. Aorta: Unremarkable course, caliber, contour of the thoracic aorta. No aneurysm or dissection flap. No periaortic fluid. Pulmonary arteries: No central, lobar, segmental, or proximal subsegmental filling defects. Mediastinum/Nodes: No mediastinal adenopathy. Essentially intrathoracic stomach. Unremarkable appearance of the thoracic inlet Lungs/Pleura: Geographic  ground-glass opacity throughout the lungs. No pneumothorax or pleural effusion. Subpleural reticulation in the posterior aspects of the lower lungs. No endotracheal or endobronchial debris. Musculoskeletal: No acute displaced fracture. Degenerative changes of the spine. Review of the MIP images confirms the above findings. CT PELVIS FINDINGS Bowel: Large stool burden of the visualized colon, particularly the sigmoid colon and rectum. Questionable inflammatory changes at the anterior rectum, at the interface of the urinary bladder, with poorly defined fat plane.  Vascular/Lymphatic: Small caliber distal aorta and bilateral iliac arteries which remain patent. Reproductive: Unremarkable appearance of the visualized prostate. Other: Fat containing umbilical hernia, with loop of colon intimately associated with the hernia sac. No inflammatory changes. Musculoskeletal: No acute displaced fracture. Chronic changes of the bilateral hips, particularly on the left of hip dysplasia. CT RIGHT LOWER EXTREMITY Vasculature: Small caliber right common femoral artery and profunda femoris. Superficial femoral artery is patent to the knee without significant atherosclerotic changes. No significant atherosclerotic calcifications of the popliteal artery or the tibial arteries. The timing of the contrast bolus is not optimized for evaluation of the arteries. Unremarkable appearance of the right lower extremity superficial veins. Right-sided deep veins are not well evaluated on the current study. Musculoskeletal: Fat attenuation of the musculature of the right lower extremity. Circumferential edema/swelling of the right lower extremity below the knee. No myofacial or subcutaneous gas. No acute displaced fracture.  No significant degenerative changes. Review of the MIP images confirms the above findings. IMPRESSION: Chest: CT angiogram is negative for pulmonary emboli. Geographic ground-glass throughout the lungs, frequently seen with either small vessel disease or small airway disease. Edema is favored less likely given the absence of pleural effusion and interlobular septal thickening. Hiatal hernia with essentially intrathoracic stomach. Abdomen: Fecal impaction with questionable developing stercoral colitis at the anterior rectum. Right lower extremity: Nonspecific circumferential swelling/edema of the right lower leg below the knee. No evidence of myofacial or subcutaneous gas to indicate necrotizing infection. Electronically Signed   By: Corrie Mckusick D.O.   On: 08/03/2017 20:19    I  personally discussed the CT scan and ultrasound results with the radiologist on-call Dr. Jeb Levering who recommended discussion with interventional radiology regarding next step as both the CT scan and the ultrasound were nondiagnostic of deep venous thrombosis.  Assessment/Plan Principal Problem:   Cellulitis of right lower extremity without foot Active Problems:   Chronic venous insufficiency   GERD (gastroesophageal reflux disease)   Chronic diastolic (congestive) heart failure (HCC)   OSA (obstructive sleep apnea)   Type 2 diabetes mellitus with complication, without long-term current use of insulin (HCC)   Dysphagia   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HCC)   Depression with anxiety  1.  Edema swelling and erythema of the right lower extremity differential diagnosis includes cellulitis versus deep venous thrombosis.  The patient has been treated for cellulitis the leg remains unchanged and worsening.  There is no white cell count there is no fever.  The presentation of the leg could be consistent with a deep venous thrombosis or a partially treated cellulitis.  This point I believe he would need a diagnostic test to determine which of these is the case.  I would recommend patient get a punch biopsy of the right lower extremity as well as discussed with interventional radiology the possibility of an imaging test (right lower extremity venogram?)  To get a better idea as to what is the actual diagnosis were working with.  For right now as the patient has had  multiple other types of antibiotics we will start him on clindamycin 600 mg IV every 8 hours as well as a heparin drip.  Would recommend direct discussion with interventional radiology tomorrow morning and perhaps obtaining a punch biopsy of the skin for better identification of underlying problem.  2.  Gastroesophageal reflux disease continue proton pump inhibitor.  3.  Chronic diastolic congestive heart failure: Continue home treatment which  includes Lasix and Crestor as well as Diovan.  4.  Obstructive sleep apnea: Patient to resume home regimen.  If he has a CPAP machine we will ask him to bring it in.  5.  Type 2 diabetes mellitus with complication without long-term current use of insulin: Obviously complication appears to be the cellulitis if this is in fact is the diagnosis.  Will continue to check fingerstick blood glucoses before meals and at bedtime and cover with sliding scale insulin.  Will hold metformin.  6.  Dysphasia: Likely related to patient's underlying cerebral palsy if appears to be having difficulty swallowing we will consider speech therapy evaluation.  7.  Congenital cerebral palsy: Noted continue supportive care.  8.  Depression with anxiety: Currently not on any medication likely in remission.    DVT prophylaxis: Heparin drip Code Status: Full code Family Communication: Family present at evaluation.  Patient retains capacity to make decisions. Disposition Plan: Likely back to group home Consults called: Recommend discussion with interventional radiology tomorrow morning.  As above Admission status: Inpatient  Lady Deutscher MD FACP [facility] Pager [phone]  If 7PM-7AM, please contact night-coverage [email or web address] Password [facility]  08/03/2017, 11:03 PM

## 2017-08-03 NOTE — Progress Notes (Signed)
Right lower extremity venous duplex attempted/. Preliminary results extremely limited due to swelling and contractures. No obvious evidence of dvt in the few areas able to be imaged. Inconclusive study. No change since exam of 07/27/2017. [place] 08/03/2017 4:54 PM

## 2017-08-04 ENCOUNTER — Other Ambulatory Visit: Payer: Self-pay

## 2017-08-04 ENCOUNTER — Encounter (HOSPITAL_COMMUNITY): Payer: Self-pay | Admitting: General Practice

## 2017-08-04 DIAGNOSIS — I5032 Chronic diastolic (congestive) heart failure: Secondary | ICD-10-CM

## 2017-08-04 DIAGNOSIS — I872 Venous insufficiency (chronic) (peripheral): Secondary | ICD-10-CM

## 2017-08-04 DIAGNOSIS — L0390 Cellulitis, unspecified: Secondary | ICD-10-CM

## 2017-08-04 HISTORY — DX: Cellulitis, unspecified: L03.90

## 2017-08-04 LAB — CBC
HEMATOCRIT: 28.5 % — AB (ref 39.0–52.0)
HEMATOCRIT: 34.8 % — AB (ref 39.0–52.0)
HEMOGLOBIN: 9 g/dL — AB (ref 13.0–17.0)
HEMOGLOBIN: 11.2 g/dL — AB (ref 13.0–17.0)
MCH: 25.5 pg — AB (ref 26.0–34.0)
MCH: 26.1 pg (ref 26.0–34.0)
MCHC: 31.6 g/dL (ref 30.0–36.0)
MCHC: 32.2 g/dL (ref 30.0–36.0)
MCV: 80.7 fL (ref 78.0–100.0)
MCV: 81.1 fL (ref 78.0–100.0)
PLATELETS: 178 10*3/uL (ref 150–400)
Platelets: 196 10*3/uL (ref 150–400)
RBC: 3.53 MIL/uL — AB (ref 4.22–5.81)
RBC: 4.29 MIL/uL (ref 4.22–5.81)
RDW: 13 % (ref 11.5–15.5)
RDW: 13 % (ref 11.5–15.5)
WBC: 4.3 10*3/uL (ref 4.0–10.5)
WBC: 4.7 10*3/uL (ref 4.0–10.5)

## 2017-08-04 LAB — BASIC METABOLIC PANEL
Anion gap: 3 — ABNORMAL LOW (ref 5–15)
BUN: 11 mg/dL (ref 6–20)
CHLORIDE: 115 mmol/L — AB (ref 101–111)
CO2: 21 mmol/L — AB (ref 22–32)
CREATININE: 0.62 mg/dL (ref 0.61–1.24)
Calcium: 7 mg/dL — ABNORMAL LOW (ref 8.9–10.3)
GFR calc non Af Amer: >60 mL/min (ref >60)
Glucose, Bld: 96 mg/dL (ref 65–99)
Potassium: 6 mmol/L — ABNORMAL HIGH (ref 3.5–5.1)
Sodium: 139 mmol/L (ref 135–145)

## 2017-08-04 LAB — CBG MONITORING, ED
Glucose-Capillary: 90 mg/dL (ref 65–99)
Glucose-Capillary: 122 mg/dL — ABNORMAL HIGH (ref 65–99)

## 2017-08-04 LAB — POTASSIUM: Potassium: 4.4 mmol/L (ref 3.5–5.1)

## 2017-08-04 LAB — HEMOGLOBIN A1C
HEMOGLOBIN A1C: 6.4 % — AB (ref 4.8–5.6)
MEAN PLASMA GLUCOSE: 136.98 mg/dL

## 2017-08-04 LAB — HEPARIN LEVEL (UNFRACTIONATED)
HEPARIN UNFRACTIONATED: 0.36 [IU]/mL (ref 0.30–0.70)
Heparin Unfractionated: 0.26 IU/mL — ABNORMAL LOW (ref 0.30–0.70)

## 2017-08-04 LAB — GLUCOSE, CAPILLARY
GLUCOSE-CAPILLARY: 101 mg/dL — AB (ref 65–99)
Glucose-Capillary: 119 mg/dL — ABNORMAL HIGH (ref 65–99)

## 2017-08-04 MED ORDER — SODIUM POLYSTYRENE SULFONATE 15 GM/60ML PO SUSP
30.0000 g | Freq: Once | ORAL | Status: AC
Start: 2017-08-04 — End: 2017-08-04
  Administered 2017-08-04: 30 g via ORAL
  Filled 2017-08-04: qty 120

## 2017-08-04 MED ORDER — INSULIN ASPART 100 UNIT/ML ~~LOC~~ SOLN
10.0000 [IU] | Freq: Once | SUBCUTANEOUS | Status: DC
  Filled 2017-08-04: qty 1

## 2017-08-04 MED ORDER — INSULIN ASPART 100 UNIT/ML ~~LOC~~ SOLN
0.0000 [IU] | Freq: Three times a day (TID) | SUBCUTANEOUS | Status: DC
  Administered 2017-08-05: 1 [IU] via SUBCUTANEOUS
  Administered 2017-08-05: 2 [IU] via SUBCUTANEOUS
  Administered 2017-08-06: 1 [IU] via SUBCUTANEOUS

## 2017-08-04 MED ORDER — SODIUM CHLORIDE 0.9 % IV BOLUS
250.0000 mL | Freq: Once | INTRAVENOUS | Status: AC
  Administered 2017-08-04: 250 mL via INTRAVENOUS

## 2017-08-04 MED ORDER — DEXTROSE 50 % IV SOLN
0.5000 | Freq: Once | INTRAVENOUS | Status: DC
  Filled 2017-08-04: qty 50

## 2017-08-04 MED ORDER — FUROSEMIDE 20 MG PO TABS
20.0000 mg | ORAL_TABLET | Freq: Every day | ORAL | Status: DC
  Administered 2017-08-05 – 2017-08-06 (×2): 20 mg via ORAL
  Filled 2017-08-04 (×2): qty 1

## 2017-08-04 MED ORDER — INSULIN ASPART 100 UNIT/ML ~~LOC~~ SOLN: 0.0000 [IU] | Freq: Every day | SUBCUTANEOUS | Status: DC

## 2017-08-04 MED ORDER — INSULIN ASPART 100 UNIT/ML IV SOLN: 10.0000 [IU] | Freq: Once | INTRAVENOUS | Status: DC

## 2017-08-04 NOTE — Progress Notes (Signed)
[place] for Heparin Indication: DVT  Allergies  Allergen Reactions  . Penicillins Hives, Nausea And Vomiting and Other (See Comments)    Patient tolerated cefazolin in 2017 Has patient had a PCN reaction causing immediate rash, facial/tongue/throat swelling, SOB or lightheadedness with hypotension: Yes Has patient had a PCN reaction causing severe rash involving mucus membranes or skin necrosis: No Has patient had a PCN reaction that required hospitalization No Has patient had a PCN reaction occurring within the last 10 years: Yes If all of the above answers are "NO", then may proceed with Cephalosporin use.  . Sulfamethoxazole-Trimethoprim Nausea And Vomiting  . Metronidazole Nausea And Vomiting    Patient Measurements:   Heparin Dosing Weight: 65 kg  Vital Signs: Temp: 97.6 F (36.4 C) (05/22 0905) Temp Source: Oral (05/22 0905) BP: 114/81 (05/22 0930) Pulse Rate: 87 (05/22 0930)  Labs: Recent Labs    08/03/17 1547 08/04/17 0322 08/04/17 0830  HGB 12.5* 9.0* 11.2*  HCT 38.4* 28.5* 34.8*  PLT 260 178 196  LABPROT 13.2  --   --   INR 1.01  --   --   HEPARINUNFRC  --   --  0.26*  CREATININE 0.88 0.62  --     Estimated Creatinine Clearance: 101.9 mL/min (by C-G formula based on SCr of 0.62 mg/dL).    Assessment: 47 y.o. male with RLE pain/swelling continues on IV heparin for possible DVT. Hgb 11.2 and platelets are WNL. No bleeding noted and no AC PTA. Heparin level is subtherapeutic at 0.26.   Goal of Therapy:  Heparin level 0.3-0.7 units/ml Monitor platelets by anticoagulation protocol: Yes   Plan:  Increase heparin gtt to 1200 units/hr Check an 8 hr heparin level Daily heparin level and CBC MD - Please evaluate planned LOT  Wendel Homeyer, Rande Lawman 08/04/2017,10:53 AM

## 2017-08-04 NOTE — Progress Notes (Addendum)
[facility]                                                                              Patient Demographics  Thomas Ramos, is a 47 y.o. male, DOB - October 19, 1970, PIR:[id number]  Admit date - 08/03/2017   Admitting Physician Lady Deutscher, MD  Outpatient Primary MD for the patient is Chesley Noon, MD  Outpatient specialists:   LOS - 1  days   Medical records reviewed and are as summarized below:    Chief Complaint  Patient presents with  . Leg Pain       Brief summary   Patient is a 47 year old male with history of cerebral palsy, diabetes type 2, GERD, quadriplegic, cellulitis of the right lower extremity, recently treated with antibiotics.  Patient presented with complaints of worsening erythema and discomfort around the scab to the right leg.  No fevers or leukocytosis.   Assessment & Plan    Principal Problem:   Cellulitis of right lower extremity without foot -CT angiogram chest showed no PE, fecal impaction with stercoral colitis.  CT right lower extremity showed nonspecific circumferential edema/swelling of the right lower leg below the knee, no necrotizing infection or gas.  -Doppler ultrasound of the lower extremity showed inconclusive study likely due to contractures, will check with interventional radiology if venogram is feasible -For now continue IV heparin drip, antibiotics Addendum 1:05 pm - Discussed with Dr. Barbie Banner, did not see any DVT on CT right lower extremity with contrast however not 100% conclusive and recommended to repeat Doppler ultrasound.  Venogram is not the best test either for this patient due to contractures. - will hold off fluids, continue Lasix low-dose, repeat Doppler ultrasound right lower extremity  in a.m.   Active Problems:   Dysphagia -History of underlying cerebral palsy, appears to have chronic dysphagia, obtain swallow evaluation  ?  Hyperkalemia -Continued IV fluids with potassium, Avapro, Kayexalate x1  given -Repeat potassium was 4.4 however previous lab could have been hemolyzed  Hypotension BP somewhat soft, alert and awake, appears to be at baseline, hold furosemide, amlodipine, Avapro  Chronic diastolic CHF -Will restart Lasix once BP improves, for now hold Avapro, amlodipine Currently compensated     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HCC) -Supportive care    GERD (gastroesophageal reflux disease) -Continue PPI    OSA (obstructive sleep apnea) -Continue home regimen     Type 2 diabetes mellitus with complication, without long-term current use of insulin (HCC) -Obtain hemoglobin A1c, change sliding scale insulin to sensitive  Code Status: Full CODE STATUS DVT Prophylaxis: Heparin drip Family Communication: No family member at bedside   Disposition Plan:   Time Spent in minutes 25 minutes  Procedures:  CT angiogram  Consultants:   IR  Antimicrobials:   Clindamycin 5/21   Medications  Scheduled Meds: . acidophilus  2 capsule Oral Daily  . insulin aspart  0-15 Units Subcutaneous TID WC  . latanoprost  1 drop Both Eyes QHS  . mometasone-formoterol  2 puff Inhalation BID  . montelukast  10 mg Oral Daily  . rosuvastatin  5 mg Oral QHS  . senna  1  tablet Oral BID  . sodium chloride flush  3 mL Intravenous Q12H  . timolol  1 drop Both Eyes Q12H   Continuous Infusions: . sodium chloride    . clindamycin (CLEOCIN) IV Stopped (08/04/17 6144)  . heparin 1,000 Units/hr (08/04/17 0951)   PRN Meds:.sodium chloride, bisacodyl, magnesium citrate, naproxen, ondansetron **OR** ondansetron (ZOFRAN) IV, polyethylene glycol, sodium chloride flush, traMADol   Antibiotics   Anti-infectives (From admission, onward)   Start     Dose/Rate Route Frequency Ordered Stop   08/04/17 0600  clindamycin (CLEOCIN) IVPB 600 mg     600 mg 100 mL/hr over 30 Minutes Intravenous Every 8 hours 08/03/17 2256     08/03/17 2045  clindamycin (CLEOCIN) IVPB 900 mg     900 mg 100 mL/hr over 30  Minutes Intravenous  Once 08/03/17 2034 08/03/17 2243        Subjective:   Thomas Ramos was seen and examined today.  Appears to be at baseline, no acute complaints.  No fevers or chills.  No pain.  Objective:   Vitals:   08/04/17 0900 08/04/17 0905 08/04/17 0928 08/04/17 0930  BP: 105/71 96/70  114/81  Pulse: 82 88  87  Resp: 14 18  (!) 21  Temp:  97.6 F (36.4 C)    TempSrc:  Oral    SpO2: 99% 99% 97% 98%    Intake/Output Summary (Last 24 hours) at 08/04/2017 1208 Last data filed at 08/04/2017 3154 Gross per 24 hour  Intake 1150 ml  Output 500 ml  Net 650 ml     Wt Readings from Last 3 Encounters:  07/28/17 79.5 kg (175 lb 4.3 oz)  05/07/17 75.8 kg (167 lb 1.6 oz)  03/18/17 71.5 kg (157 lb 10.1 oz)     Exam  General: Alert and awake, appears to be at baseline, was talking on the phone prior to my encounter, speech garbled  Eyes:   HEENT:  Atraumatic, normocephalic, normal oropharynx  Cardiovascular: S1 S2 auscultated, no rubs, murmurs or gallops. Regular rate and rhythm.  Respiratory: Clear to auscultation bilaterally, no wheezing, rales or rhonchi  Gastrointestinal: Soft, nontender, nondistended, + bowel sounds  Ext: no pedal edema bilaterally  Neuro: difficult to assess, contractures  Musculoskeletal: No digital cyanosis, clubbing  Skin: Circumferential erythematous rash on the right lower extremity, warm and erythematous  Psych: at baseline, has cerebral palsy   Data Reviewed:  I have personally reviewed following labs and imaging studies  Micro Results No results found for this or any previous visit (from the past 240 hour(s)).  Radiology Reports Dg Tibia/fibula Right  Result Date: 07/20/2017 CLINICAL DATA:  RIGHT leg contusion. EXAM: RIGHT TIBIA AND FIBULA - 2 VIEW COMPARISON:  None. FINDINGS: There is no evidence of fracture or other focal bone lesions. Soft tissue swelling. No radiopaque foreign body. Osteopenia. IMPRESSION: No fracture.   Osteopenia.  Soft tissue swelling. Electronically Signed   By: Staci Righter M.D.   On: 07/20/2017 15:02   Ct Angio Chest Pe W Or Wo Contrast  Result Date: 08/03/2017 CLINICAL DATA:  47 year old male with cellulitis around a scab on the right leg EXAM: CT ANGIOGRAPHY CHEST CT PELVIS WITH CONTRAST CT RIGHT LOWER EXTREMITY WITH CONTRAST TECHNIQUE: Multidetector CT imaging of the chest was performed using the standard protocol during bolus administration of intravenous contrast. Multiplanar CT image reconstructions and MIPs were obtained to evaluate the vascular anatomy. Multidetector CT imaging of the pelvis was then performed using the standard protocol during bolus administration  of intravenous contrast. Additional spiral CT images were performed of the right lower extremity after the imaging of the chest in abdomen. Reformatted images were performed on a separate workstation CONTRAST:  186mL ISOVUE-370 IOPAMIDOL (ISOVUE-370) INJECTION 76% COMPARISON:  None. FINDINGS: CTA CHEST FINDINGS Cardiovascular: Heart: No cardiomegaly. No pericardial fluid/thickening. No significant coronary calcifications. Aorta: Unremarkable course, caliber, contour of the thoracic aorta. No aneurysm or dissection flap. No periaortic fluid. Pulmonary arteries: No central, lobar, segmental, or proximal subsegmental filling defects. Mediastinum/Nodes: No mediastinal adenopathy. Essentially intrathoracic stomach. Unremarkable appearance of the thoracic inlet Lungs/Pleura: Geographic ground-glass opacity throughout the lungs. No pneumothorax or pleural effusion. Subpleural reticulation in the posterior aspects of the lower lungs. No endotracheal or endobronchial debris. Musculoskeletal: No acute displaced fracture. Degenerative changes of the spine. Review of the MIP images confirms the above findings. CT PELVIS FINDINGS Bowel: Large stool burden of the visualized colon, particularly the sigmoid colon and rectum. Questionable inflammatory  changes at the anterior rectum, at the interface of the urinary bladder, with poorly defined fat plane. Vascular/Lymphatic: Small caliber distal aorta and bilateral iliac arteries which remain patent. Reproductive: Unremarkable appearance of the visualized prostate. Other: Fat containing umbilical hernia, with loop of colon intimately associated with the hernia sac. No inflammatory changes. Musculoskeletal: No acute displaced fracture. Chronic changes of the bilateral hips, particularly on the left of hip dysplasia. CT RIGHT LOWER EXTREMITY Vasculature: Small caliber right common femoral artery and profunda femoris. Superficial femoral artery is patent to the knee without significant atherosclerotic changes. No significant atherosclerotic calcifications of the popliteal artery or the tibial arteries. The timing of the contrast bolus is not optimized for evaluation of the arteries. Unremarkable appearance of the right lower extremity superficial veins. Right-sided deep veins are not well evaluated on the current study. Musculoskeletal: Fat attenuation of the musculature of the right lower extremity. Circumferential edema/swelling of the right lower extremity below the knee. No myofacial or subcutaneous gas. No acute displaced fracture.  No significant degenerative changes. Review of the MIP images confirms the above findings. IMPRESSION: Chest: CT angiogram is negative for pulmonary emboli. Geographic ground-glass throughout the lungs, frequently seen with either small vessel disease or small airway disease. Edema is favored less likely given the absence of pleural effusion and interlobular septal thickening. Hiatal hernia with essentially intrathoracic stomach. Abdomen: Fecal impaction with questionable developing stercoral colitis at the anterior rectum. Right lower extremity: Nonspecific circumferential swelling/edema of the right lower leg below the knee. No evidence of myofacial or subcutaneous gas to indicate  necrotizing infection. Electronically Signed   By: Corrie Mckusick D.O.   On: 08/03/2017 20:19   Ct Pelvis W Contrast  Result Date: 08/03/2017 CLINICAL DATA:  47 year old male with cellulitis around a scab on the right leg EXAM: CT ANGIOGRAPHY CHEST CT PELVIS WITH CONTRAST CT RIGHT LOWER EXTREMITY WITH CONTRAST TECHNIQUE: Multidetector CT imaging of the chest was performed using the standard protocol during bolus administration of intravenous contrast. Multiplanar CT image reconstructions and MIPs were obtained to evaluate the vascular anatomy. Multidetector CT imaging of the pelvis was then performed using the standard protocol during bolus administration of intravenous contrast. Additional spiral CT images were performed of the right lower extremity after the imaging of the chest in abdomen. Reformatted images were performed on a separate workstation CONTRAST:  141mL ISOVUE-370 IOPAMIDOL (ISOVUE-370) INJECTION 76% COMPARISON:  None. FINDINGS: CTA CHEST FINDINGS Cardiovascular: Heart: No cardiomegaly. No pericardial fluid/thickening. No significant coronary calcifications. Aorta: Unremarkable course, caliber, contour of the thoracic aorta.  No aneurysm or dissection flap. No periaortic fluid. Pulmonary arteries: No central, lobar, segmental, or proximal subsegmental filling defects. Mediastinum/Nodes: No mediastinal adenopathy. Essentially intrathoracic stomach. Unremarkable appearance of the thoracic inlet Lungs/Pleura: Geographic ground-glass opacity throughout the lungs. No pneumothorax or pleural effusion. Subpleural reticulation in the posterior aspects of the lower lungs. No endotracheal or endobronchial debris. Musculoskeletal: No acute displaced fracture. Degenerative changes of the spine. Review of the MIP images confirms the above findings. CT PELVIS FINDINGS Bowel: Large stool burden of the visualized colon, particularly the sigmoid colon and rectum. Questionable inflammatory changes at the anterior  rectum, at the interface of the urinary bladder, with poorly defined fat plane. Vascular/Lymphatic: Small caliber distal aorta and bilateral iliac arteries which remain patent. Reproductive: Unremarkable appearance of the visualized prostate. Other: Fat containing umbilical hernia, with loop of colon intimately associated with the hernia sac. No inflammatory changes. Musculoskeletal: No acute displaced fracture. Chronic changes of the bilateral hips, particularly on the left of hip dysplasia. CT RIGHT LOWER EXTREMITY Vasculature: Small caliber right common femoral artery and profunda femoris. Superficial femoral artery is patent to the knee without significant atherosclerotic changes. No significant atherosclerotic calcifications of the popliteal artery or the tibial arteries. The timing of the contrast bolus is not optimized for evaluation of the arteries. Unremarkable appearance of the right lower extremity superficial veins. Right-sided deep veins are not well evaluated on the current study. Musculoskeletal: Fat attenuation of the musculature of the right lower extremity. Circumferential edema/swelling of the right lower extremity below the knee. No myofacial or subcutaneous gas. No acute displaced fracture.  No significant degenerative changes. Review of the MIP images confirms the above findings. IMPRESSION: Chest: CT angiogram is negative for pulmonary emboli. Geographic ground-glass throughout the lungs, frequently seen with either small vessel disease or small airway disease. Edema is favored less likely given the absence of pleural effusion and interlobular septal thickening. Hiatal hernia with essentially intrathoracic stomach. Abdomen: Fecal impaction with questionable developing stercoral colitis at the anterior rectum. Right lower extremity: Nonspecific circumferential swelling/edema of the right lower leg below the knee. No evidence of myofacial or subcutaneous gas to indicate necrotizing infection.  Electronically Signed   By: Corrie Mckusick D.O.   On: 08/03/2017 20:19   Dg Knee Complete 4 Views Right  Result Date: 07/20/2017 CLINICAL DATA:  RIGHT leg contusion. EXAM: RIGHT KNEE - COMPLETE 4+ VIEW COMPARISON:  None. FINDINGS: No evidence of fracture, dislocation, or joint effusion. No evidence of arthropathy or other focal bone abnormality. Soft tissues are unremarkable. IMPRESSION: Negative. Electronically Signed   By: Staci Righter M.D.   On: 07/20/2017 15:01   Ct Extremity Lower Right W Contrast  Result Date: 08/03/2017 CLINICAL DATA:  47 year old male with cellulitis around a scab on the right leg EXAM: CT ANGIOGRAPHY CHEST CT PELVIS WITH CONTRAST CT RIGHT LOWER EXTREMITY WITH CONTRAST TECHNIQUE: Multidetector CT imaging of the chest was performed using the standard protocol during bolus administration of intravenous contrast. Multiplanar CT image reconstructions and MIPs were obtained to evaluate the vascular anatomy. Multidetector CT imaging of the pelvis was then performed using the standard protocol during bolus administration of intravenous contrast. Additional spiral CT images were performed of the right lower extremity after the imaging of the chest in abdomen. Reformatted images were performed on a separate workstation CONTRAST:  156mL ISOVUE-370 IOPAMIDOL (ISOVUE-370) INJECTION 76% COMPARISON:  None. FINDINGS: CTA CHEST FINDINGS Cardiovascular: Heart: No cardiomegaly. No pericardial fluid/thickening. No significant coronary calcifications. Aorta: Unremarkable course, caliber, contour of  the thoracic aorta. No aneurysm or dissection flap. No periaortic fluid. Pulmonary arteries: No central, lobar, segmental, or proximal subsegmental filling defects. Mediastinum/Nodes: No mediastinal adenopathy. Essentially intrathoracic stomach. Unremarkable appearance of the thoracic inlet Lungs/Pleura: Geographic ground-glass opacity throughout the lungs. No pneumothorax or pleural effusion. Subpleural  reticulation in the posterior aspects of the lower lungs. No endotracheal or endobronchial debris. Musculoskeletal: No acute displaced fracture. Degenerative changes of the spine. Review of the MIP images confirms the above findings. CT PELVIS FINDINGS Bowel: Large stool burden of the visualized colon, particularly the sigmoid colon and rectum. Questionable inflammatory changes at the anterior rectum, at the interface of the urinary bladder, with poorly defined fat plane. Vascular/Lymphatic: Small caliber distal aorta and bilateral iliac arteries which remain patent. Reproductive: Unremarkable appearance of the visualized prostate. Other: Fat containing umbilical hernia, with loop of colon intimately associated with the hernia sac. No inflammatory changes. Musculoskeletal: No acute displaced fracture. Chronic changes of the bilateral hips, particularly on the left of hip dysplasia. CT RIGHT LOWER EXTREMITY Vasculature: Small caliber right common femoral artery and profunda femoris. Superficial femoral artery is patent to the knee without significant atherosclerotic changes. No significant atherosclerotic calcifications of the popliteal artery or the tibial arteries. The timing of the contrast bolus is not optimized for evaluation of the arteries. Unremarkable appearance of the right lower extremity superficial veins. Right-sided deep veins are not well evaluated on the current study. Musculoskeletal: Fat attenuation of the musculature of the right lower extremity. Circumferential edema/swelling of the right lower extremity below the knee. No myofacial or subcutaneous gas. No acute displaced fracture.  No significant degenerative changes. Review of the MIP images confirms the above findings. IMPRESSION: Chest: CT angiogram is negative for pulmonary emboli. Geographic ground-glass throughout the lungs, frequently seen with either small vessel disease or small airway disease. Edema is favored less likely given the  absence of pleural effusion and interlobular septal thickening. Hiatal hernia with essentially intrathoracic stomach. Abdomen: Fecal impaction with questionable developing stercoral colitis at the anterior rectum. Right lower extremity: Nonspecific circumferential swelling/edema of the right lower leg below the knee. No evidence of myofacial or subcutaneous gas to indicate necrotizing infection. Electronically Signed   By: Corrie Mckusick D.O.   On: 08/03/2017 20:19    Lab Data:  CBC: Recent Labs  Lab 08/03/17 1547 08/04/17 0322 08/04/17 0830  WBC 5.8 4.3 4.7  NEUTROABS 3.3  --   --   HGB 12.5* 9.0* 11.2*  HCT 38.4* 28.5* 34.8*  MCV 79.3 80.7 81.1  PLT 260 178 673   Basic Metabolic Panel: Recent Labs  Lab 08/03/17 1547 08/04/17 0322 08/04/17 0830  NA 137 139  --   K 4.0 6.0* 4.4  CL 103 115*  --   CO2 24 21*  --   GLUCOSE 133* 96  --   BUN 14 11  --   CREATININE 0.88 0.62  --   CALCIUM 9.1 7.0*  --    GFR: Estimated Creatinine Clearance: 101.9 mL/min (by C-G formula based on SCr of 0.62 mg/dL). Liver Function Tests: Recent Labs  Lab 08/03/17 1547  AST 16  ALT 19  ALKPHOS 120  BILITOT 0.4  PROT 6.9  ALBUMIN 3.9   No results for input(s): LIPASE, AMYLASE in the last 168 hours. No results for input(s): AMMONIA in the last 168 hours. Coagulation Profile: Recent Labs  Lab 08/03/17 1547  INR 1.01   Cardiac Enzymes: No results for input(s): CKTOTAL, CKMB, CKMBINDEX, TROPONINI in the last  168 hours. BNP (last 3 results) No results for input(s): PROBNP in the last 8760 hours. HbA1C: No results for input(s): HGBA1C in the last 72 hours. CBG: Recent Labs  Lab 07/28/17 1626 07/28/17 2210 07/29/17 0602 08/04/17 0743  GLUCAP 159* 132* 95 90   Lipid Profile: No results for input(s): CHOL, HDL, LDLCALC, TRIG, CHOLHDL, LDLDIRECT in the last 72 hours. Thyroid Function Tests: No results for input(s): TSH, T4TOTAL, FREET4, T3FREE, THYROIDAB in the last 72  hours. Anemia Panel: No results for input(s): VITAMINB12, FOLATE, FERRITIN, TIBC, IRON, RETICCTPCT in the last 72 hours. Urine analysis:    Component Value Date/Time   COLORURINE STRAW (A) 05/07/2017 1403   APPEARANCEUR CLEAR 05/07/2017 1403   LABSPEC 1.006 05/07/2017 1403   PHURINE 6.0 05/07/2017 1403   GLUCOSEU NEGATIVE 05/07/2017 1403   HGBUR NEGATIVE 05/07/2017 1403   BILIRUBINUR NEGATIVE 05/07/2017 1403   KETONESUR NEGATIVE 05/07/2017 1403   PROTEINUR NEGATIVE 05/07/2017 1403   UROBILINOGEN 1.0 05/26/2014 2211   NITRITE NEGATIVE 05/07/2017 1403   LEUKOCYTESUR NEGATIVE 05/07/2017 1403     Sharol Croghan M.D. [facility] 08/04/2017, 12:08 PM  Pager: [phone] Between 7am to 7pm - call Pager - [phone]  After 7pm go to [email or web address] - password [email or web address]  Call night coverage person covering after 7pm

## 2017-08-04 NOTE — ED Notes (Signed)
Dr. Tana Coast contacted with Lab results.

## 2017-08-04 NOTE — ED Notes (Signed)
Dr Tana Coast at [facility]

## 2017-08-04 NOTE — ED Notes (Signed)
Dr. Tana Coast contacted with CBG and dextrose and insulin on hold at this time.

## 2017-08-04 NOTE — ED Notes (Signed)
Pt eating lunch and tolerating well. 

## 2017-08-04 NOTE — Progress Notes (Signed)
ANTICOAGULATION CONSULT NOTE - Follow-Up  Pharmacy Consult for Heparin Indication: rule out DVT  Allergies  Allergen Reactions  . Penicillins Hives, Nausea And Vomiting and Other (See Comments)    Patient tolerated cefazolin in 2017 Has patient had a PCN reaction causing immediate rash, facial/tongue/throat swelling, SOB or lightheadedness with hypotension: Yes Has patient had a PCN reaction causing severe rash involving mucus membranes or skin necrosis: No Has patient had a PCN reaction that required hospitalization No Has patient had a PCN reaction occurring within the last 10 years: Yes If all of the above answers are "NO", then may proceed with Cephalosporin use.  . Sulfamethoxazole-Trimethoprim Nausea And Vomiting  . Metronidazole Nausea And Vomiting    Patient Measurements: Weight: 166 lb 3.6 oz (75.4 kg) Heparin Dosing Weight: 65 kg  Vital Signs: Temp: 97.6 F (36.4 C) (05/22 1610) Temp Source: Oral (05/22 1610) BP: 106/69 (05/22 1610) Pulse Rate: 77 (05/22 1610)  Labs: Recent Labs    08/03/17 1547 08/04/17 0322 08/04/17 0830 08/04/17 1937  HGB 12.5* 9.0* 11.2*  --   HCT 38.4* 28.5* 34.8*  --   PLT 260 178 196  --   LABPROT 13.2  --   --   --   INR 1.01  --   --   --   HEPARINUNFRC  --   --  0.26* 0.36  CREATININE 0.88 0.62  --   --     Estimated Creatinine Clearance: 99.3 mL/min (by C-G formula based on SCr of 0.62 mg/dL).    Assessment: 47 y.o. male with RLE pain/swelling continues on IV heparin for possible DVT.  Initial doppler was inconclusive.  Plan for repeat doppler in AM.  Heparin level is therapeutic on 1200 units/hr.   Goal of Therapy:  Heparin level 0.3-0.7 units/ml Monitor platelets by anticoagulation protocol: Yes   Plan:  Continue heparin gtt at 1200 units/hr Next heparin level with AM labs. Daily heparin level and CBC  Manilla Strieter, Pharm.D., BCPS Clinical Pharmacist 08/04/2017 9:11 PM

## 2017-08-04 NOTE — ED Notes (Signed)
Pillows at leg repostioned for comfort

## 2017-08-04 NOTE — ED Notes (Signed)
Helped assist pt with eating pt ate all pancakes and strip of bacon and drunk coffee. Pt did not want oatmeal or milk.

## 2017-08-04 NOTE — Progress Notes (Signed)
Thomas Ramos [id number]  Code Status: FULL  Admission Data: 08/04/2017 6:04 PM  Attending Provider: Tana Coast  DXI:[id number], Rebeca Alert, MD  Consults/ Treatment Team:   Thomas Ramos is a 47 y.o. male patient admitted from ED awake, alert - oriented X 4 - no acute distress noted. VSS - Blood pressure 106/69, pulse 77, temperature 97.6 F (36.4 C), temperature source Oral, resp. rate 18, weight 75.4 kg (166 lb 3.6 oz), SpO2 99 %. no c/o shortness of breath, no c/o chest pain.Orientation to room, and floor completed with information packet given to patient. Admission INP armband ID verified with patient, and in place.  SR up x 2, fall assessment complete, with patient and family able to verbalize understanding of risk associated with falls, and verbalized understanding to call nsg before up out of bed. Call light within reach, patient able to voice, and demonstrate understanding. Skin and dry, MASD noted to buttocks and abdominal fold. No evidence of skin break down noted on exam.   ?  Will cont to eval and treat per MD orders.  Melonie Florida, RN  08/04/2017 6:04 PM

## 2017-08-05 ENCOUNTER — Inpatient Hospital Stay (HOSPITAL_COMMUNITY): Payer: Medicare Other

## 2017-08-05 DIAGNOSIS — M7989 Other specified soft tissue disorders: Secondary | ICD-10-CM

## 2017-08-05 DIAGNOSIS — G809 Cerebral palsy, unspecified: Secondary | ICD-10-CM

## 2017-08-05 LAB — CBC
HEMATOCRIT: 30.6 % — AB (ref 39.0–52.0)
HEMOGLOBIN: 9.8 g/dL — AB (ref 13.0–17.0)
MCH: 25.9 pg — ABNORMAL LOW (ref 26.0–34.0)
MCHC: 32 g/dL (ref 30.0–36.0)
MCV: 81 fL (ref 78.0–100.0)
PLATELETS: 192 10*3/uL (ref 150–400)
RBC: 3.78 MIL/uL — AB (ref 4.22–5.81)
RDW: 12.9 % (ref 11.5–15.5)
WBC: 4 10*3/uL (ref 4.0–10.5)

## 2017-08-05 LAB — GLUCOSE, CAPILLARY
GLUCOSE-CAPILLARY: 135 mg/dL — AB (ref 65–99)
Glucose-Capillary: 72 mg/dL (ref 65–99)
Glucose-Capillary: 139 mg/dL — ABNORMAL HIGH (ref 65–99)
Glucose-Capillary: 170 mg/dL — ABNORMAL HIGH (ref 65–99)

## 2017-08-05 LAB — BASIC METABOLIC PANEL
ANION GAP: 6 (ref 5–15)
BUN: 10 mg/dL (ref 6–20)
CHLORIDE: 105 mmol/L (ref 101–111)
CO2: 26 mmol/L (ref 22–32)
Calcium: 8.5 mg/dL — ABNORMAL LOW (ref 8.9–10.3)
Creatinine, Ser: 0.61 mg/dL (ref 0.61–1.24)
GFR calc Af Amer: >60 mL/min (ref >60)
GLUCOSE: 90 mg/dL (ref 65–99)
POTASSIUM: 3.9 mmol/L (ref 3.5–5.1)
Sodium: 137 mmol/L (ref 135–145)

## 2017-08-05 LAB — HEPARIN LEVEL (UNFRACTIONATED): Heparin Unfractionated: 0.52 IU/mL (ref 0.30–0.70)

## 2017-08-05 MED ORDER — SODIUM CHLORIDE 0.9 % IV BOLUS
250.0000 mL | Freq: Once | INTRAVENOUS | Status: AC
  Administered 2017-08-05: 250 mL via INTRAVENOUS

## 2017-08-05 MED ORDER — ENOXAPARIN SODIUM 40 MG/0.4ML ~~LOC~~ SOLN
40.0000 mg | SUBCUTANEOUS | Status: DC
  Administered 2017-08-05: 40 mg via SUBCUTANEOUS
  Filled 2017-08-05: qty 0.4

## 2017-08-05 NOTE — Care Management Note (Signed)
Case Management Note  Patient Details  Name: Thomas Ramos MRN: [id number] Date of Birth: July 06, 1970  Subjective/Objective:   Presents with cellulitis of RLE, hx of cerebral palsy, diabetes type 2, GERD, quadriplegic. From home alone. NCM @ bedside to speak with pt about discharge planning. Pt asked if NCM would call [name] and discuss  d/c planning. NCM briefly spoke with [name] and learned pt receives care via a program provided by [employer], Personnel officer.  NCM briefly spoke with [name]. 2/2 going into a meeting. [name] to call NCM back to discuss pt's living stuation.   [name] (Other) [name] ([phone]     PCP: Anastasia Pall  Action/Plan:  Transition back to home when medically stable ... NCM  following for disposition needs. Pt will need PTAR arranged for transportation to home once d/c.  Expected Discharge Date:                  Expected Discharge Plan:  Home/Self Care  In-House Referral:     Discharge planning Services  CM Consult  Post Acute Care Choice:    Choice offered to:     DME Arranged:    DME Agency:     HH Arranged:    HH Agency:     Status of Service:  In process, will continue to follow  If discussed at Long Length of Stay Meetings, dates discussed:    Additional Comments:  [name], RN 08/05/2017, 3:43 PM

## 2017-08-05 NOTE — Evaluation (Signed)
Clinical/Bedside Swallow Evaluation Patient Details  Name: Thomas Ramos MRN: [id number] Date of Birth: Aug 06, 1970  Today's Date: 08/05/2017 Time: SLP Start Time (ACUTE ONLY): 0814 SLP Stop Time (ACUTE ONLY): 0933 SLP Time Calculation (min) (ACUTE ONLY): 9 min  Past Medical History:  Past Medical History:  Diagnosis Date  . Anxiety   . Cellulitis 08/04/2017  . Cerebral palsy ([place])   . Chronic diastolic (congestive) heart failure ([place])   . CP (cerebral palsy) ([place])   . Diabetes mellitus without complication (HCC)    borderline  . Environmental allergies    takes inhalers if needed  . Esophageal stricture   . GERD (gastroesophageal reflux disease)   . Hypertension   . Motility disorder, esophageal   . Pneumonia   . 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[place])   . S/P Botox injection    approx every 4 months  . Seasonal allergies    Past Surgical History:  Past Surgical History:  Procedure Laterality Date  . BOTOX INJECTION N/A 12/21/2012   Procedure: BOTOX INJECTION;  Surgeon: Inda Castle, MD;  Location: [facility];  Service: Endoscopy;  Laterality: N/A;  . BOTOX INJECTION N/A 06/28/2014   Procedure: BOTOX INJECTION;  Surgeon: Inda Castle, MD;  Location: [facility];  Service: Endoscopy;  Laterality: N/A;  . ESOPHAGOGASTRODUODENOSCOPY N/A 12/21/2012   Procedure: ESOPHAGOGASTRODUODENOSCOPY (EGD);  Surgeon: Inda Castle, MD;  Location: [facility];  Service: Endoscopy;  Laterality: N/A;  . ESOPHAGOGASTRODUODENOSCOPY N/A 06/28/2014   Procedure: ESOPHAGOGASTRODUODENOSCOPY (EGD);  Surgeon: Inda Castle, MD;  Location: [facility];  Service: Endoscopy;  Laterality: N/A;  . ESOPHAGUS SURGERY     stretched esophagus  . legs    . MOUTH SURGERY    . TENDON RELEASE     HPI:  Thomas Ramos is a 30 y M with hx cerebral palsy, cellulitis, chf, diabetes mellitus type 2, GERD, esophageal dilation, and HTN.  Pt presented to ED with redness and swelling of leg and was found to have  cellulitis.  Pt has hx of minimal pharyngeal dysphagia and is known to this service from prior admissions.   Assessment / Plan / Recommendation Clinical Impression  Pt presents with functional swallowing as assessed clinically.  Pt tolerated all consistencies trialed today with no clinical s/s of aspiraiton.  Pt's vocal quality is harsh/gravelly at baseline in setting of hx of CP and exhibits mild dysarthria.  Pt has hx of minimal pharyngeal dysphagia and consumes regular diet at baseline.  Pt denies any changes in swallow function.  Chest CT 5/21 revealed "Geographic ground-glass throughout the lungs, frequently seen with either small vessel disease or small airway disease. Edema is favored less likely given the absence of pleural effusion and interlobular septal thickening."  Recommend continuing regular texture diet with thin liquids. SLP Visit Diagnosis: Dysphagia, pharyngeal phase (R13.13)    Aspiration Risk  Mild aspiration risk    Diet Recommendation Regular;Thin liquid   Liquid Administration via: Cup;Straw Medication Administration: (No specific precautions) Supervision: Staff to assist with self feeding Postural Changes: Seated upright at 90 degrees(as able)    Other  Recommendations Oral Care Recommendations: Oral care BID   Follow up Recommendations None        Swallow Study   General Date of Onset: 07/27/16 HPI: Thomas Ramos is a 52 y M with hx cerebral palsy, cellulitis, chf, diabetes mellitus type 2, GERD and HTN.  Pt presented to ED with redness and swelling of leg and was found to have cellulitis.  Pt  has hx of minimal pharyngeal dysphagia and is known to this service from prior admissions. Type of Study: Bedside Swallow Evaluation Previous Swallow Assessment: Multiple prior MBSSs.  Most recent study 03/15/17 revealed "minimal pharyngeal dysphagia marked by consistent penetration with thin, unsensed" with recommendations for nectar thick liquid for a defined amount of time  prior to returning to thin liquids as overall health status improved.  Pt also completed MBSS  Diet Prior to this Study: Regular;Thin liquids Temperature Spikes Noted: No Behavior/Cognition: Alert;Cooperative;Pleasant mood Oral Cavity Assessment: Within Functional Limits Oral Care Completed by SLP: No Oral Cavity - Dentition: Adequate natural dentition Self-Feeding Abilities: Needs set up Patient Positioning: Upright in bed Baseline Vocal Quality: Hoarse(dysphonic) Volitional Cough: Strong Volitional Swallow: Able to elicit    Oral/Motor/Sensory Function Overall Oral Motor/Sensory Function: Mild impairment Facial ROM: Reduced right;Reduced left Facial Symmetry: Within Functional Limits Lingual ROM: Reduced right;Reduced left Lingual Symmetry: Within Functional Limits Lingual Strength: Reduced Lingual Sensation: Within Functional Limits Velum: Within Functional Limits   Ice Chips     Thin Liquid Thin Liquid: Within functional limits Presentation: Cup;Straw    Puree Puree: Within functional limits Presentation: Spoon   Solid     Solid: Within functional limits Presentation: (SLP fed)        Thomas Ramos E Thomas Ramos 08/05/2017,10:38 AM

## 2017-08-05 NOTE — Progress Notes (Signed)
[facility]                                                                              Patient Demographics  Thomas Ramos, is a 47 y.o. male, DOB - 09-25-1970, MEQ:[id number]  Admit date - 08/03/2017   Admitting Physician Lady Deutscher, MD  Outpatient Primary MD for the patient is Chesley Noon, MD  Outpatient specialists:   LOS - 2  days   Medical records reviewed and are as summarized below:    Chief Complaint  Patient presents with  . Leg Pain       Brief summary   Patient is a 47 year old male with history of cerebral palsy, diabetes type 2, GERD, quadriplegic, cellulitis of the right lower extremity, recently treated with antibiotics.  Patient presented with complaints of worsening erythema and discomfort around the scab to the right leg.  No fevers or leukocytosis.   Assessment & Plan    Principal Problem:   Cellulitis of right lower extremity without foot -CT angiogram chest showed no PE, fecal impaction with stercoral colitis.  CT right lower extremity showed nonspecific circumferential edema/swelling of the right lower leg below the knee, no necrotizing infection or gas.  -Doppler ultrasound of the lower extremity showed inconclusive study likely due to contractures, will check with interventional radiology if venogram is feasible -Right lower extremity improving, erythema improving, still has swelling - Discussed with Dr. Barbie Banner, IR on 5/22 did not see any DVT on CT right lower extremity with contrast however not 100% conclusive and recommended to repeat Doppler ultrasound.  Venogram is not the best test either for this patient due to contractures. -Restart Lasix today, repeat Doppler ultrasound of the lower extremity, continue IV antibiotics and IV heparin for now   Active Problems:   Dysphagia -History of underlying cerebral palsy, appears to have chronic dysphagia -Appreciate swallow evaluation, mild aspiration risk.  ?   Hyperkalemia -Possibly hemolyzed, resolved potassium 3.9  Hypotension BP soft, continue to hold amlodipine and Avapro  Chronic diastolic CHF -Restart outpatient low-dose Lasix today, continue to hold Avapro and amlodipine    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HCC) -Supportive care    GERD (gastroesophageal reflux disease) -Continue PPI    OSA (obstructive sleep apnea) -Continue home regimen     Type 2 diabetes mellitus with complication, without long-term current use of insulin (HCC) - A1c 6.4  change sliding scale insulin to sensitive  Code Status: Full CODE STATUS DVT Prophylaxis: Heparin drip Family Communication: No family member at bedside   Disposition Plan:   Time Spent in minutes 25 minutes  Procedures:  CT angiogram  Consultants:   IR  Antimicrobials:   Clindamycin 5/21   Medications  Scheduled Meds: . acidophilus  2 capsule Oral Daily  . furosemide  20 mg Oral Daily  . insulin aspart  0-5 Units Subcutaneous QHS  . insulin aspart  0-9 Units Subcutaneous TID WC  . latanoprost  1 drop Both Eyes QHS  . mometasone-formoterol  2 puff Inhalation BID  . montelukast  10 mg Oral Daily  . rosuvastatin  5 mg Oral QHS  . senna  1 tablet  Oral BID  . sodium chloride flush  3 mL Intravenous Q12H  . timolol  1 drop Both Eyes Q12H   Continuous Infusions: . sodium chloride    . clindamycin (CLEOCIN) IV 600 mg (08/05/17 0616)  . heparin 1,200 Units/hr (08/04/17 1939)   PRN Meds:.sodium chloride, bisacodyl, magnesium citrate, naproxen, ondansetron **OR** ondansetron (ZOFRAN) IV, polyethylene glycol, sodium chloride flush, traMADol   Antibiotics   Anti-infectives (From admission, onward)   Start     Dose/Rate Route Frequency Ordered Stop   08/04/17 0600  clindamycin (CLEOCIN) IVPB 600 mg     600 mg 100 mL/hr over 30 Minutes Intravenous Every 8 hours 08/03/17 2256     08/03/17 2045  clindamycin (CLEOCIN) IVPB 900 mg     900 mg 100 mL/hr over 30 Minutes Intravenous   Once 08/03/17 2034 08/03/17 2243        Subjective:   Thomas Ramos was seen and examined today.  Feels better today, no significant pain in the right lower leg, no acute issues overnight.  No fevers or chills.  No chest pain or shortness of breath.  Objective:   Vitals:   08/05/17 0543 08/05/17 0727 08/05/17 0741 08/05/17 1230  BP: (!) 78/65  95/68 103/66  Pulse: (!) 57  60 79  Resp: 18  16 16   Temp: 97.8 F (36.6 C)  98.1 F (36.7 C) 97.8 F (36.6 C)  TempSrc:   Oral Oral  SpO2: 94% 96% 97% 97%  Weight: 75.8 kg (167 lb 1.7 oz)     Height:        Intake/Output Summary (Last 24 hours) at 08/05/2017 1237 Last data filed at 08/05/2017 1236 Gross per 24 hour  Intake 1377.33 ml  Output 1550 ml  Net -172.67 ml     Wt Readings from Last 3 Encounters:  08/05/17 75.8 kg (167 lb 1.7 oz)  07/28/17 79.5 kg (175 lb 4.3 oz)  05/07/17 75.8 kg (167 lb 1.6 oz)     Exam   General: Alert and oriented x 3, NAD, speech somewhat garbled however appears to be baseline due to cerebral palsy  Eyes:   HEENT:   Cardiovascular: S1 S2 auscultated, Regular rate and rhythm  Respiratory: CTA B  Gastrointestinal: Soft, nontender, nondistended, + bowel sounds  Ext: right lower extremity still has some swelling but no erythema, patient reports no tenderness, contractures both lower extremities  Neuro: contract  Musculoskeletal: No digital cyanosis, clubbing  Skin: No rashes  Psych: Normal affect and demeanor, alert and oriented x3    Data Reviewed:  I have personally reviewed following labs and imaging studies  Micro Results Recent Results (from the past 240 hour(s))  Blood culture (routine x 2)     Status: None (Preliminary result)   Collection Time: 08/03/17  6:48 PM  Result Value Ref Range Status   Specimen Description BLOOD LEFT HAND  Final   Special Requests   Final    BOTTLES DRAWN AEROBIC AND ANAEROBIC Blood Culture results may not be optimal due to an inadequate volume  of blood received in culture bottles   Culture   Final    NO GROWTH < 24 HOURS Performed at [facility], [place] [address] [place], [place] [address]    Report Status PENDING  Incomplete  Blood culture (routine x 2)     Status: None (Preliminary result)   Collection Time: 08/03/17  6:55 PM  Result Value Ref Range Status   Specimen Description BLOOD LEFT FOREARM  Final   Special Requests   Final    BOTTLES DRAWN AEROBIC ONLY Blood Culture results may not be optimal due to an inadequate volume of blood received in culture bottles   Culture   Final    NO GROWTH < 24 HOURS Performed at [facility] [address] [place],  [address]    Report Status PENDING  Incomplete    Radiology Reports Dg Tibia/fibula Right  Result Date: 07/20/2017 CLINICAL DATA:  RIGHT leg contusion. EXAM: RIGHT TIBIA AND FIBULA - 2 VIEW COMPARISON:  None. FINDINGS: There is no evidence of fracture or other focal bone lesions. Soft tissue swelling. No radiopaque foreign body. Osteopenia. IMPRESSION: No fracture.  Osteopenia.  Soft tissue swelling. Electronically Signed   By: Staci Righter M.D.   On: 07/20/2017 15:02   Ct Angio Chest Pe W Or Wo Contrast  Result Date: 08/03/2017 CLINICAL DATA:  47 year old male with cellulitis around a scab on the right leg EXAM: CT ANGIOGRAPHY CHEST CT PELVIS WITH CONTRAST CT RIGHT LOWER EXTREMITY WITH CONTRAST TECHNIQUE: Multidetector CT imaging of the chest was performed using the standard protocol during bolus administration of intravenous contrast. Multiplanar CT image reconstructions and MIPs were obtained to evaluate the vascular anatomy. Multidetector CT imaging of the pelvis was then performed using the standard protocol during bolus administration of intravenous contrast. Additional spiral CT images were performed of the right lower extremity after the imaging of the chest in abdomen. Reformatted images were performed on a separate workstation CONTRAST:  164mL  ISOVUE-370 IOPAMIDOL (ISOVUE-370) INJECTION 76% COMPARISON:  None. FINDINGS: CTA CHEST FINDINGS Cardiovascular: Heart: No cardiomegaly. No pericardial fluid/thickening. No significant coronary calcifications. Aorta: Unremarkable course, caliber, contour of the thoracic aorta. No aneurysm or dissection flap. No periaortic fluid. Pulmonary arteries: No central, lobar, segmental, or proximal subsegmental filling defects. Mediastinum/Nodes: No mediastinal adenopathy. Essentially intrathoracic stomach. Unremarkable appearance of the thoracic inlet Lungs/Pleura: Geographic ground-glass opacity throughout the lungs. No pneumothorax or pleural effusion. Subpleural reticulation in the posterior aspects of the lower lungs. No endotracheal or endobronchial debris. Musculoskeletal: No acute displaced fracture. Degenerative changes of the spine. Review of the MIP images confirms the above findings. CT PELVIS FINDINGS Bowel: Large stool burden of the visualized colon, particularly the sigmoid colon and rectum. Questionable inflammatory changes at the anterior rectum, at the interface of the urinary bladder, with poorly defined fat plane. Vascular/Lymphatic: Small caliber distal aorta and bilateral iliac arteries which remain patent. Reproductive: Unremarkable appearance of the visualized prostate. Other: Fat containing umbilical hernia, with loop of colon intimately associated with the hernia sac. No inflammatory changes. Musculoskeletal: No acute displaced fracture. Chronic changes of the bilateral hips, particularly on the left of hip dysplasia. CT RIGHT LOWER EXTREMITY Vasculature: Small caliber right common femoral artery and profunda femoris. Superficial femoral artery is patent to the knee without significant atherosclerotic changes. No significant atherosclerotic calcifications of the popliteal artery or the tibial arteries. The timing of the contrast bolus is not optimized for evaluation of the arteries. Unremarkable  appearance of the right lower extremity superficial veins. Right-sided deep veins are not well evaluated on the current study. Musculoskeletal: Fat attenuation of the musculature of the right lower extremity. Circumferential edema/swelling of the right lower extremity below the knee. No myofacial or subcutaneous gas. No acute displaced fracture.  No significant degenerative changes. Review of the MIP images confirms the above findings. IMPRESSION: Chest: CT angiogram is negative for pulmonary emboli. Geographic ground-glass throughout the lungs, frequently seen with either  small vessel disease or small airway disease. Edema is favored less likely given the absence of pleural effusion and interlobular septal thickening. Hiatal hernia with essentially intrathoracic stomach. Abdomen: Fecal impaction with questionable developing stercoral colitis at the anterior rectum. Right lower extremity: Nonspecific circumferential swelling/edema of the right lower leg below the knee. No evidence of myofacial or subcutaneous gas to indicate necrotizing infection. Electronically Signed   By: Corrie Mckusick D.O.   On: 08/03/2017 20:19   Ct Pelvis W Contrast  Result Date: 08/03/2017 CLINICAL DATA:  47 year old male with cellulitis around a scab on the right leg EXAM: CT ANGIOGRAPHY CHEST CT PELVIS WITH CONTRAST CT RIGHT LOWER EXTREMITY WITH CONTRAST TECHNIQUE: Multidetector CT imaging of the chest was performed using the standard protocol during bolus administration of intravenous contrast. Multiplanar CT image reconstructions and MIPs were obtained to evaluate the vascular anatomy. Multidetector CT imaging of the pelvis was then performed using the standard protocol during bolus administration of intravenous contrast. Additional spiral CT images were performed of the right lower extremity after the imaging of the chest in abdomen. Reformatted images were performed on a separate workstation CONTRAST:  124mL ISOVUE-370 IOPAMIDOL  (ISOVUE-370) INJECTION 76% COMPARISON:  None. FINDINGS: CTA CHEST FINDINGS Cardiovascular: Heart: No cardiomegaly. No pericardial fluid/thickening. No significant coronary calcifications. Aorta: Unremarkable course, caliber, contour of the thoracic aorta. No aneurysm or dissection flap. No periaortic fluid. Pulmonary arteries: No central, lobar, segmental, or proximal subsegmental filling defects. Mediastinum/Nodes: No mediastinal adenopathy. Essentially intrathoracic stomach. Unremarkable appearance of the thoracic inlet Lungs/Pleura: Geographic ground-glass opacity throughout the lungs. No pneumothorax or pleural effusion. Subpleural reticulation in the posterior aspects of the lower lungs. No endotracheal or endobronchial debris. Musculoskeletal: No acute displaced fracture. Degenerative changes of the spine. Review of the MIP images confirms the above findings. CT PELVIS FINDINGS Bowel: Large stool burden of the visualized colon, particularly the sigmoid colon and rectum. Questionable inflammatory changes at the anterior rectum, at the interface of the urinary bladder, with poorly defined fat plane. Vascular/Lymphatic: Small caliber distal aorta and bilateral iliac arteries which remain patent. Reproductive: Unremarkable appearance of the visualized prostate. Other: Fat containing umbilical hernia, with loop of colon intimately associated with the hernia sac. No inflammatory changes. Musculoskeletal: No acute displaced fracture. Chronic changes of the bilateral hips, particularly on the left of hip dysplasia. CT RIGHT LOWER EXTREMITY Vasculature: Small caliber right common femoral artery and profunda femoris. Superficial femoral artery is patent to the knee without significant atherosclerotic changes. No significant atherosclerotic calcifications of the popliteal artery or the tibial arteries. The timing of the contrast bolus is not optimized for evaluation of the arteries. Unremarkable appearance of the right  lower extremity superficial veins. Right-sided deep veins are not well evaluated on the current study. Musculoskeletal: Fat attenuation of the musculature of the right lower extremity. Circumferential edema/swelling of the right lower extremity below the knee. No myofacial or subcutaneous gas. No acute displaced fracture.  No significant degenerative changes. Review of the MIP images confirms the above findings. IMPRESSION: Chest: CT angiogram is negative for pulmonary emboli. Geographic ground-glass throughout the lungs, frequently seen with either small vessel disease or small airway disease. Edema is favored less likely given the absence of pleural effusion and interlobular septal thickening. Hiatal hernia with essentially intrathoracic stomach. Abdomen: Fecal impaction with questionable developing stercoral colitis at the anterior rectum. Right lower extremity: Nonspecific circumferential swelling/edema of the right lower leg below the knee. No evidence of myofacial or subcutaneous gas to indicate necrotizing infection.  Electronically Signed   By: Corrie Mckusick D.O.   On: 08/03/2017 20:19   Dg Knee Complete 4 Views Right  Result Date: 07/20/2017 CLINICAL DATA:  RIGHT leg contusion. EXAM: RIGHT KNEE - COMPLETE 4+ VIEW COMPARISON:  None. FINDINGS: No evidence of fracture, dislocation, or joint effusion. No evidence of arthropathy or other focal bone abnormality. Soft tissues are unremarkable. IMPRESSION: Negative. Electronically Signed   By: Staci Righter M.D.   On: 07/20/2017 15:01   Ct Extremity Lower Right W Contrast  Result Date: 08/03/2017 CLINICAL DATA:  47 year old male with cellulitis around a scab on the right leg EXAM: CT ANGIOGRAPHY CHEST CT PELVIS WITH CONTRAST CT RIGHT LOWER EXTREMITY WITH CONTRAST TECHNIQUE: Multidetector CT imaging of the chest was performed using the standard protocol during bolus administration of intravenous contrast. Multiplanar CT image reconstructions and MIPs were  obtained to evaluate the vascular anatomy. Multidetector CT imaging of the pelvis was then performed using the standard protocol during bolus administration of intravenous contrast. Additional spiral CT images were performed of the right lower extremity after the imaging of the chest in abdomen. Reformatted images were performed on a separate workstation CONTRAST:  176mL ISOVUE-370 IOPAMIDOL (ISOVUE-370) INJECTION 76% COMPARISON:  None. FINDINGS: CTA CHEST FINDINGS Cardiovascular: Heart: No cardiomegaly. No pericardial fluid/thickening. No significant coronary calcifications. Aorta: Unremarkable course, caliber, contour of the thoracic aorta. No aneurysm or dissection flap. No periaortic fluid. Pulmonary arteries: No central, lobar, segmental, or proximal subsegmental filling defects. Mediastinum/Nodes: No mediastinal adenopathy. Essentially intrathoracic stomach. Unremarkable appearance of the thoracic inlet Lungs/Pleura: Geographic ground-glass opacity throughout the lungs. No pneumothorax or pleural effusion. Subpleural reticulation in the posterior aspects of the lower lungs. No endotracheal or endobronchial debris. Musculoskeletal: No acute displaced fracture. Degenerative changes of the spine. Review of the MIP images confirms the above findings. CT PELVIS FINDINGS Bowel: Large stool burden of the visualized colon, particularly the sigmoid colon and rectum. Questionable inflammatory changes at the anterior rectum, at the interface of the urinary bladder, with poorly defined fat plane. Vascular/Lymphatic: Small caliber distal aorta and bilateral iliac arteries which remain patent. Reproductive: Unremarkable appearance of the visualized prostate. Other: Fat containing umbilical hernia, with loop of colon intimately associated with the hernia sac. No inflammatory changes. Musculoskeletal: No acute displaced fracture. Chronic changes of the bilateral hips, particularly on the left of hip dysplasia. CT RIGHT LOWER  EXTREMITY Vasculature: Small caliber right common femoral artery and profunda femoris. Superficial femoral artery is patent to the knee without significant atherosclerotic changes. No significant atherosclerotic calcifications of the popliteal artery or the tibial arteries. The timing of the contrast bolus is not optimized for evaluation of the arteries. Unremarkable appearance of the right lower extremity superficial veins. Right-sided deep veins are not well evaluated on the current study. Musculoskeletal: Fat attenuation of the musculature of the right lower extremity. Circumferential edema/swelling of the right lower extremity below the knee. No myofacial or subcutaneous gas. No acute displaced fracture.  No significant degenerative changes. Review of the MIP images confirms the above findings. IMPRESSION: Chest: CT angiogram is negative for pulmonary emboli. Geographic ground-glass throughout the lungs, frequently seen with either small vessel disease or small airway disease. Edema is favored less likely given the absence of pleural effusion and interlobular septal thickening. Hiatal hernia with essentially intrathoracic stomach. Abdomen: Fecal impaction with questionable developing stercoral colitis at the anterior rectum. Right lower extremity: Nonspecific circumferential swelling/edema of the right lower leg below the knee. No evidence of myofacial or subcutaneous gas to  indicate necrotizing infection. Electronically Signed   By: Corrie Mckusick D.O.   On: 08/03/2017 20:19    Lab Data:  CBC: Recent Labs  Lab 08/03/17 1547 08/04/17 0322 08/04/17 0830 08/05/17 0327  WBC 5.8 4.3 4.7 4.0  NEUTROABS 3.3  --   --   --   HGB 12.5* 9.0* 11.2* 9.8*  HCT 38.4* 28.5* 34.8* 30.6*  MCV 79.3 80.7 81.1 81.0  PLT 260 178 196 299   Basic Metabolic Panel: Recent Labs  Lab 08/03/17 1547 08/04/17 0322 08/04/17 0830 08/05/17 0327  NA 137 139  --  137  K 4.0 6.0* 4.4 3.9  CL 103 115*  --  105  CO2 24  21*  --  26  GLUCOSE 133* 96  --  90  BUN 14 11  --  10  CREATININE 0.88 0.62  --  0.61  CALCIUM 9.1 7.0*  --  8.5*   GFR: Estimated Creatinine Clearance: 99.5 mL/min (by C-G formula based on SCr of 0.61 mg/dL). Liver Function Tests: Recent Labs  Lab 08/03/17 1547  AST 16  ALT 19  ALKPHOS 120  BILITOT 0.4  PROT 6.9  ALBUMIN 3.9   No results for input(s): LIPASE, AMYLASE in the last 168 hours. No results for input(s): AMMONIA in the last 168 hours. Coagulation Profile: Recent Labs  Lab 08/03/17 1547  INR 1.01   Cardiac Enzymes: No results for input(s): CKTOTAL, CKMB, CKMBINDEX, TROPONINI in the last 168 hours. BNP (last 3 results) No results for input(s): PROBNP in the last 8760 hours. HbA1C: Recent Labs    08/04/17 1312  HGBA1C 6.4*   CBG: Recent Labs  Lab 08/04/17 0743 08/04/17 1232 08/04/17 1727 08/04/17 2159 08/05/17 0750  GLUCAP 90 122* 119* 101* 72   Lipid Profile: No results for input(s): CHOL, HDL, LDLCALC, TRIG, CHOLHDL, LDLDIRECT in the last 72 hours. Thyroid Function Tests: No results for input(s): TSH, T4TOTAL, FREET4, T3FREE, THYROIDAB in the last 72 hours. Anemia Panel: No results for input(s): VITAMINB12, FOLATE, FERRITIN, TIBC, IRON, RETICCTPCT in the last 72 hours. Urine analysis:    Component Value Date/Time   COLORURINE STRAW (A) 05/07/2017 1403   APPEARANCEUR CLEAR 05/07/2017 1403   LABSPEC 1.006 05/07/2017 1403   PHURINE 6.0 05/07/2017 1403   GLUCOSEU NEGATIVE 05/07/2017 1403   HGBUR NEGATIVE 05/07/2017 1403   BILIRUBINUR NEGATIVE 05/07/2017 1403   KETONESUR NEGATIVE 05/07/2017 1403   PROTEINUR NEGATIVE 05/07/2017 1403   UROBILINOGEN 1.0 05/26/2014 2211   NITRITE NEGATIVE 05/07/2017 1403   LEUKOCYTESUR NEGATIVE 05/07/2017 1403     Munachimso Rigdon M.D. [facility] 08/05/2017, 12:37 PM  Pager: [phone] Between 7am to 7pm - call Pager - [phone]  After 7pm go to [email or web address] - password [email or web address]  Call night coverage  person covering after 7pm

## 2017-08-05 NOTE — Progress Notes (Signed)
[place] for Heparin Indication: rule out DVT  Allergies  Allergen Reactions  . Penicillins Hives, Nausea And Vomiting and Other (See Comments)    Patient tolerated cefazolin in 2017 Has patient had a PCN reaction causing immediate rash, facial/tongue/throat swelling, SOB or lightheadedness with hypotension: Yes Has patient had a PCN reaction causing severe rash involving mucus membranes or skin necrosis: No Has patient had a PCN reaction that required hospitalization No Has patient had a PCN reaction occurring within the last 10 years: Yes If all of the above answers are "NO", then may proceed with Cephalosporin use.  . Sulfamethoxazole-Trimethoprim Nausea And Vomiting  . Metronidazole Nausea And Vomiting    Patient Measurements: Height: 5' (152.4 cm) Weight: 167 lb 1.7 oz (75.8 kg) IBW/kg (Calculated) : 50 Heparin Dosing Weight: 65 kg  Vital Signs: Temp: 98.1 F (36.7 C) (05/23 0741) Temp Source: Oral (05/23 0741) BP: 95/68 (05/23 0741) Pulse Rate: 60 (05/23 0741)  Labs: Recent Labs    08/03/17 1547 08/04/17 0322 08/04/17 0830 08/04/17 1937 08/05/17 0327  HGB 12.5* 9.0* 11.2*  --  9.8*  HCT 38.4* 28.5* 34.8*  --  30.6*  PLT 260 178 196  --  192  LABPROT 13.2  --   --   --   --   INR 1.01  --   --   --   --   HEPARINUNFRC  --   --  0.26* 0.36 0.52  CREATININE 0.88 0.62  --   --  0.61    Estimated Creatinine Clearance: 99.5 mL/min (by C-G formula based on SCr of 0.61 mg/dL).    Assessment: 47 y.o. male with RLE pain/swelling continues on IV heparin for possible DVT.  Initial doppler was inconclusive.  Plan for repeat doppler in AM.  Heparin level is therapeutic; no bleeding reported.  Goal of Therapy:  Heparin level 0.3-0.7 units/ml Monitor platelets by anticoagulation protocol: Yes    Plan:  Continue heparin gtt at 1200 units/hr Daily heparin level and CBC F/U repeat Doppler to determine heparin LOT   Ima Hafner D.  Mina Marble, PharmD, BCPS, BCCCP Pager:  [phone] 08/05/2017, 7:50 AM

## 2017-08-05 NOTE — Progress Notes (Signed)
Right lower extremity venous duplex has been completed. Negative for obvious evidence of DVT.  08/05/17 3:43 PM Carlos Levering RVT

## 2017-08-06 LAB — BASIC METABOLIC PANEL
Anion gap: 10 (ref 5–15)
BUN: 16 mg/dL (ref 6–20)
CO2: 25 mmol/L (ref 22–32)
CREATININE: 0.73 mg/dL (ref 0.61–1.24)
Calcium: 8.8 mg/dL — ABNORMAL LOW (ref 8.9–10.3)
Chloride: 104 mmol/L (ref 101–111)
GFR calc non Af Amer: >60 mL/min (ref >60)
Glucose, Bld: 96 mg/dL (ref 65–99)
Potassium: 3.9 mmol/L (ref 3.5–5.1)
SODIUM: 139 mmol/L (ref 135–145)

## 2017-08-06 LAB — GLUCOSE, CAPILLARY
GLUCOSE-CAPILLARY: 90 mg/dL (ref 65–99)
Glucose-Capillary: 136 mg/dL — ABNORMAL HIGH (ref 65–99)

## 2017-08-06 LAB — CBC
HCT: 34.2 % — ABNORMAL LOW (ref 39.0–52.0)
Hemoglobin: 11 g/dL — ABNORMAL LOW (ref 13.0–17.0)
MCH: 25.9 pg — AB (ref 26.0–34.0)
MCHC: 32.2 g/dL (ref 30.0–36.0)
MCV: 80.5 fL (ref 78.0–100.0)
PLATELETS: 217 10*3/uL (ref 150–400)
RBC: 4.25 MIL/uL (ref 4.22–5.81)
RDW: 13.1 % (ref 11.5–15.5)
WBC: 4.3 10*3/uL (ref 4.0–10.5)

## 2017-08-06 MED ORDER — CLINDAMYCIN HCL 300 MG PO CAPS: 300.0000 mg | ORAL_CAPSULE | Freq: Three times a day (TID) | ORAL | 0 refills | Status: AC

## 2017-08-06 MED ORDER — RISAQUAD PO CAPS: 2.0000 | ORAL_CAPSULE | Freq: Every day | ORAL | 0 refills | Status: AC

## 2017-08-06 MED ORDER — CLINDAMYCIN HCL 300 MG PO CAPS
300.0000 mg | ORAL_CAPSULE | Freq: Three times a day (TID) | ORAL | Status: DC
  Administered 2017-08-06 (×2): 300 mg via ORAL
  Filled 2017-08-06 (×2): qty 1

## 2017-08-06 MED ORDER — POLYETHYLENE GLYCOL 3350 17 G PO PACK: 17.0000 g | PACK | Freq: Every day | ORAL | 3 refills | Status: DC | PRN

## 2017-08-06 NOTE — Plan of Care (Addendum)
Patient medically stable, back at baseline, and will be discharged home. Message sent to case manager for [facility] transportation home. Discharge orders have been placed by Dr. Tana Coast.

## 2017-08-06 NOTE — Care Management Important Message (Signed)
Important Message  Patient Details  Name: Thomas Ramos MRN: [id number] Date of Birth: September 27, 1970   Medicare Important Message Given:  Yes    Keshonna Valvo P Bryah [place] 08/06/2017, 3:32 PM

## 2017-08-06 NOTE — Discharge Summary (Signed)
Physician Discharge Summary   Patient ID: Thomas Ramos MRN: [id number] DOB/AGE: [id number] y.o.  Admit date: 08/03/2017 Discharge date: 08/06/2017  Primary Care Physician:  Chesley Noon, MD   Recommendations for Outpatient Follow-up:  1. Follow up with PCP in 2 weeks  Home Health: None Equipment/Devices: none   Discharge Condition: stable  CODE STATUS: FULL  Diet recommendation: Carb modified diet   Discharge Diagnoses:    . Cellulitis of right lower extremity . 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 GERD (gastroesophageal reflux disease) . Depression with anxiety . Chronic venous insufficiency . Chronic diastolic (congestive) heart failure ([place]) . OSA (obstructive sleep apnea)   Consults: None   Allergies:   Allergies  Allergen Reactions  . Penicillins Hives, Nausea And Vomiting and Other (See Comments)    Patient tolerated cefazolin in 2017 Has patient had a PCN reaction causing immediate rash, facial/tongue/throat swelling, SOB or lightheadedness with hypotension: Yes Has patient had a PCN reaction causing severe rash involving mucus membranes or skin necrosis: No Has patient had a PCN reaction that required hospitalization No Has patient had a PCN reaction occurring within the last 10 years: Yes If all of the above answers are "NO", then may proceed with Cephalosporin use.  . Sulfamethoxazole-Trimethoprim Nausea And Vomiting  . Metronidazole Nausea And Vomiting     DISCHARGE MEDICATIONS: Allergies as of 08/06/2017      Reactions   Penicillins Hives, Nausea And Vomiting, Other (See Comments)   Patient tolerated cefazolin in 2017 Has patient had a PCN reaction causing immediate rash, facial/tongue/throat swelling, SOB or lightheadedness with hypotension: Yes Has patient had a PCN reaction causing severe rash involving mucus membranes or skin necrosis: No Has patient had a PCN reaction that required hospitalization No Has patient had a PCN reaction  occurring within the last 10 years: Yes If all of the above answers are "NO", then may proceed with Cephalosporin use.   Sulfamethoxazole-trimethoprim Nausea And Vomiting   Metronidazole Nausea And Vomiting      Medication List    STOP taking these medications   amLODipine 10 MG tablet Commonly known as:  NORVASC   cephALEXin 500 MG capsule Commonly known as:  KEFLEX   valsartan 80 MG tablet Commonly known as:  DIOVAN     TAKE these medications   acidophilus Caps capsule Take 2 capsules by mouth daily for 15 days.   budesonide-formoterol 80-4.5 MCG/ACT inhaler Commonly known as:  SYMBICORT Inhale 2 puffs into the lungs 2 (two) times daily.   clindamycin 300 MG capsule Commonly known as:  CLEOCIN Take 1 capsule (300 mg total) by mouth 3 (three) times daily for 7 days.   furosemide 20 MG tablet Commonly known as:  LASIX Take 20 mg by mouth daily.   latanoprost 0.005 % ophthalmic solution Commonly known as:  XALATAN Place 1 drop into both eyes at bedtime.   metFORMIN 500 MG 24 hr tablet Commonly known as:  GLUCOPHAGE-XR Take 1,000 mg by mouth at bedtime.   montelukast 10 MG tablet Commonly known as:  SINGULAIR Take 10 mg by mouth daily.   naproxen sodium 220 MG tablet Commonly known as:  ALEVE Take 220-440 mg by mouth 2 (two) times daily as needed (for pain).   ondansetron 4 MG tablet Commonly known as:  ZOFRAN Take 1 tablet (4 mg total) by mouth every 6 (six) hours. What changed:    when to take this  reasons to take this   polyethylene glycol packet Commonly known as:  MIRALAX / GLYCOLAX Take 17 g by mouth daily as needed for mild constipation or moderate constipation.   potassium chloride 10 MEQ tablet Commonly known as:  K-DUR Take 10 mEq by mouth daily.   rosuvastatin 5 MG tablet Commonly known as:  CRESTOR Take 5 mg by mouth at bedtime.   timolol 0.5 % ophthalmic solution Commonly known as:  TIMOPTIC Place 1 drop into both eyes every 12  (twelve) hours.        Brief H and P: For complete details please refer to admission H and P, but in brief Patient is a 47 year old male with history of cerebral palsy, diabetes type 2, GERD, quadriplegic, cellulitis of the right lower extremity, recently treated with antibiotics.  Patient presented with complaints of worsening erythema and discomfort around the scab to the right leg.  No fevers or leukocytosis.   Hospital Course:  Cellulitis of right lower extremity without foot -CT angiogram chest showed no PE, fecal impaction with stercoral colitis.  CT right lower extremity showed nonspecific circumferential edema/swelling of the right lower leg below the knee, no necrotizing infection or gas.  -Doppler ultrasound of the lower extremity showed inconclusive study likely due to contractures, will check with interventional radiology if venogram is feasible - Discussed with Dr. Barbie Banner, IR on 5/22 did not see any DVT on CT right lower extremity with contrast however not 100% conclusive and recommended to repeat Doppler ultrasound.  Venogram is not the best test either for this patient due to contractures. -Repeat Doppler ultrasound of the lower extremity showed no DVT.  IV heparin was discontinued. Patient had significant improvement in the cellulitis of the right lower extremity, with IV clindamycin.  Transition to oral clindamycin for 1 more week.    Dysphagia -History of underlying cerebral palsy, appears to have chronic dysphagia -Appreciate swallow evaluation, mild aspiration risk.  Patient is tolerating diet without any difficulty   Hypotension BP soft, stable continue to hold amlodipine and Avapro.  Chronic diastolic CHF -Restarted home dose of Lasix, continue to hold Avapro until BP stable  -Recommend discontinuing amlodipine due to possibility of peripheral edema worsening from amlodipine.    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HCC) -Supportive care    GERD (gastroesophageal reflux  disease) -Continue PPI    OSA (obstructive sleep apnea) -Continue home regimen     Type 2 diabetes mellitus with complication, without long-term current use of insulin (HCC) - A1c 6.4   stable, continue metformin     Day of Discharge S: Much more alert and oriented, no fevers, wants to go home  BP 105/68 (BP Location: Left Arm)   Pulse (!) 50   Temp 97.9 F (36.6 C)   Resp 18   Ht 5' (1.524 m)   Wt 74.1 kg (163 lb 5.8 oz)   SpO2 99%   BMI 31.90 kg/m   Physical Exam: General: Alert and awake oriented x3 not in any acute distress. HEENT: anicteric sclera, pupils reactive to light and accommodation CVS: S1-S2 clear no murmur rubs or gallops Chest: clear to auscultation bilaterally, no wheezing rales or rhonchi Abdomen: soft nontender, nondistended, normal bowel sounds Extremities: Right lower extremity swelling, erythema much improved Neuro: contractures   The results of significant diagnostics from this hospitalization (including imaging, microbiology, ancillary and laboratory) are listed below for reference.      Procedures/Studies:  Dg Tibia/fibula Right  Result Date: 07/20/2017 CLINICAL DATA:  RIGHT leg contusion. EXAM: RIGHT TIBIA AND FIBULA - 2 VIEW COMPARISON:  None. FINDINGS: There  is no evidence of fracture or other focal bone lesions. Soft tissue swelling. No radiopaque foreign body. Osteopenia. IMPRESSION: No fracture.  Osteopenia.  Soft tissue swelling. Electronically Signed   By: Staci Righter M.D.   On: 07/20/2017 15:02   Ct Angio Chest Pe W Or Wo Contrast  Result Date: 08/03/2017 CLINICAL DATA:  47 year old male with cellulitis around a scab on the right leg EXAM: CT ANGIOGRAPHY CHEST CT PELVIS WITH CONTRAST CT RIGHT LOWER EXTREMITY WITH CONTRAST TECHNIQUE: Multidetector CT imaging of the chest was performed using the standard protocol during bolus administration of intravenous contrast. Multiplanar CT image reconstructions and MIPs were obtained to  evaluate the vascular anatomy. Multidetector CT imaging of the pelvis was then performed using the standard protocol during bolus administration of intravenous contrast. Additional spiral CT images were performed of the right lower extremity after the imaging of the chest in abdomen. Reformatted images were performed on a separate workstation CONTRAST:  135mL ISOVUE-370 IOPAMIDOL (ISOVUE-370) INJECTION 76% COMPARISON:  None. FINDINGS: CTA CHEST FINDINGS Cardiovascular: Heart: No cardiomegaly. No pericardial fluid/thickening. No significant coronary calcifications. Aorta: Unremarkable course, caliber, contour of the thoracic aorta. No aneurysm or dissection flap. No periaortic fluid. Pulmonary arteries: No central, lobar, segmental, or proximal subsegmental filling defects. Mediastinum/Nodes: No mediastinal adenopathy. Essentially intrathoracic stomach. Unremarkable appearance of the thoracic inlet Lungs/Pleura: Geographic ground-glass opacity throughout the lungs. No pneumothorax or pleural effusion. Subpleural reticulation in the posterior aspects of the lower lungs. No endotracheal or endobronchial debris. Musculoskeletal: No acute displaced fracture. Degenerative changes of the spine. Review of the MIP images confirms the above findings. CT PELVIS FINDINGS Bowel: Large stool burden of the visualized colon, particularly the sigmoid colon and rectum. Questionable inflammatory changes at the anterior rectum, at the interface of the urinary bladder, with poorly defined fat plane. Vascular/Lymphatic: Small caliber distal aorta and bilateral iliac arteries which remain patent. Reproductive: Unremarkable appearance of the visualized prostate. Other: Fat containing umbilical hernia, with loop of colon intimately associated with the hernia sac. No inflammatory changes. Musculoskeletal: No acute displaced fracture. Chronic changes of the bilateral hips, particularly on the left of hip dysplasia. CT RIGHT LOWER EXTREMITY  Vasculature: Small caliber right common femoral artery and profunda femoris. Superficial femoral artery is patent to the knee without significant atherosclerotic changes. No significant atherosclerotic calcifications of the popliteal artery or the tibial arteries. The timing of the contrast bolus is not optimized for evaluation of the arteries. Unremarkable appearance of the right lower extremity superficial veins. Right-sided deep veins are not well evaluated on the current study. Musculoskeletal: Fat attenuation of the musculature of the right lower extremity. Circumferential edema/swelling of the right lower extremity below the knee. No myofacial or subcutaneous gas. No acute displaced fracture.  No significant degenerative changes. Review of the MIP images confirms the above findings. IMPRESSION: Chest: CT angiogram is negative for pulmonary emboli. Geographic ground-glass throughout the lungs, frequently seen with either small vessel disease or small airway disease. Edema is favored less likely given the absence of pleural effusion and interlobular septal thickening. Hiatal hernia with essentially intrathoracic stomach. Abdomen: Fecal impaction with questionable developing stercoral colitis at the anterior rectum. Right lower extremity: Nonspecific circumferential swelling/edema of the right lower leg below the knee. No evidence of myofacial or subcutaneous gas to indicate necrotizing infection. Electronically Signed   By: Corrie Mckusick D.O.   On: 08/03/2017 20:19   Ct Pelvis W Contrast  Result Date: 08/03/2017 CLINICAL DATA:  47 year old male with cellulitis around a scab  on the right leg EXAM: CT ANGIOGRAPHY CHEST CT PELVIS WITH CONTRAST CT RIGHT LOWER EXTREMITY WITH CONTRAST TECHNIQUE: Multidetector CT imaging of the chest was performed using the standard protocol during bolus administration of intravenous contrast. Multiplanar CT image reconstructions and MIPs were obtained to evaluate the vascular  anatomy. Multidetector CT imaging of the pelvis was then performed using the standard protocol during bolus administration of intravenous contrast. Additional spiral CT images were performed of the right lower extremity after the imaging of the chest in abdomen. Reformatted images were performed on a separate workstation CONTRAST:  137mL ISOVUE-370 IOPAMIDOL (ISOVUE-370) INJECTION 76% COMPARISON:  None. FINDINGS: CTA CHEST FINDINGS Cardiovascular: Heart: No cardiomegaly. No pericardial fluid/thickening. No significant coronary calcifications. Aorta: Unremarkable course, caliber, contour of the thoracic aorta. No aneurysm or dissection flap. No periaortic fluid. Pulmonary arteries: No central, lobar, segmental, or proximal subsegmental filling defects. Mediastinum/Nodes: No mediastinal adenopathy. Essentially intrathoracic stomach. Unremarkable appearance of the thoracic inlet Lungs/Pleura: Geographic ground-glass opacity throughout the lungs. No pneumothorax or pleural effusion. Subpleural reticulation in the posterior aspects of the lower lungs. No endotracheal or endobronchial debris. Musculoskeletal: No acute displaced fracture. Degenerative changes of the spine. Review of the MIP images confirms the above findings. CT PELVIS FINDINGS Bowel: Large stool burden of the visualized colon, particularly the sigmoid colon and rectum. Questionable inflammatory changes at the anterior rectum, at the interface of the urinary bladder, with poorly defined fat plane. Vascular/Lymphatic: Small caliber distal aorta and bilateral iliac arteries which remain patent. Reproductive: Unremarkable appearance of the visualized prostate. Other: Fat containing umbilical hernia, with loop of colon intimately associated with the hernia sac. No inflammatory changes. Musculoskeletal: No acute displaced fracture. Chronic changes of the bilateral hips, particularly on the left of hip dysplasia. CT RIGHT LOWER EXTREMITY Vasculature: Small  caliber right common femoral artery and profunda femoris. Superficial femoral artery is patent to the knee without significant atherosclerotic changes. No significant atherosclerotic calcifications of the popliteal artery or the tibial arteries. The timing of the contrast bolus is not optimized for evaluation of the arteries. Unremarkable appearance of the right lower extremity superficial veins. Right-sided deep veins are not well evaluated on the current study. Musculoskeletal: Fat attenuation of the musculature of the right lower extremity. Circumferential edema/swelling of the right lower extremity below the knee. No myofacial or subcutaneous gas. No acute displaced fracture.  No significant degenerative changes. Review of the MIP images confirms the above findings. IMPRESSION: Chest: CT angiogram is negative for pulmonary emboli. Geographic ground-glass throughout the lungs, frequently seen with either small vessel disease or small airway disease. Edema is favored less likely given the absence of pleural effusion and interlobular septal thickening. Hiatal hernia with essentially intrathoracic stomach. Abdomen: Fecal impaction with questionable developing stercoral colitis at the anterior rectum. Right lower extremity: Nonspecific circumferential swelling/edema of the right lower leg below the knee. No evidence of myofacial or subcutaneous gas to indicate necrotizing infection. Electronically Signed   By: Corrie Mckusick D.O.   On: 08/03/2017 20:19   Dg Knee Complete 4 Views Right  Result Date: 07/20/2017 CLINICAL DATA:  RIGHT leg contusion. EXAM: RIGHT KNEE - COMPLETE 4+ VIEW COMPARISON:  None. FINDINGS: No evidence of fracture, dislocation, or joint effusion. No evidence of arthropathy or other focal bone abnormality. Soft tissues are unremarkable. IMPRESSION: Negative. Electronically Signed   By: Staci Righter M.D.   On: 07/20/2017 15:01   Ct Extremity Lower Right W Contrast  Result Date:  08/03/2017 CLINICAL DATA:  47 year old male with  cellulitis around a scab on the right leg EXAM: CT ANGIOGRAPHY CHEST CT PELVIS WITH CONTRAST CT RIGHT LOWER EXTREMITY WITH CONTRAST TECHNIQUE: Multidetector CT imaging of the chest was performed using the standard protocol during bolus administration of intravenous contrast. Multiplanar CT image reconstructions and MIPs were obtained to evaluate the vascular anatomy. Multidetector CT imaging of the pelvis was then performed using the standard protocol during bolus administration of intravenous contrast. Additional spiral CT images were performed of the right lower extremity after the imaging of the chest in abdomen. Reformatted images were performed on a separate workstation CONTRAST:  173mL ISOVUE-370 IOPAMIDOL (ISOVUE-370) INJECTION 76% COMPARISON:  None. FINDINGS: CTA CHEST FINDINGS Cardiovascular: Heart: No cardiomegaly. No pericardial fluid/thickening. No significant coronary calcifications. Aorta: Unremarkable course, caliber, contour of the thoracic aorta. No aneurysm or dissection flap. No periaortic fluid. Pulmonary arteries: No central, lobar, segmental, or proximal subsegmental filling defects. Mediastinum/Nodes: No mediastinal adenopathy. Essentially intrathoracic stomach. Unremarkable appearance of the thoracic inlet Lungs/Pleura: Geographic ground-glass opacity throughout the lungs. No pneumothorax or pleural effusion. Subpleural reticulation in the posterior aspects of the lower lungs. No endotracheal or endobronchial debris. Musculoskeletal: No acute displaced fracture. Degenerative changes of the spine. Review of the MIP images confirms the above findings. CT PELVIS FINDINGS Bowel: Large stool burden of the visualized colon, particularly the sigmoid colon and rectum. Questionable inflammatory changes at the anterior rectum, at the interface of the urinary bladder, with poorly defined fat plane. Vascular/Lymphatic: Small caliber distal aorta and  bilateral iliac arteries which remain patent. Reproductive: Unremarkable appearance of the visualized prostate. Other: Fat containing umbilical hernia, with loop of colon intimately associated with the hernia sac. No inflammatory changes. Musculoskeletal: No acute displaced fracture. Chronic changes of the bilateral hips, particularly on the left of hip dysplasia. CT RIGHT LOWER EXTREMITY Vasculature: Small caliber right common femoral artery and profunda femoris. Superficial femoral artery is patent to the knee without significant atherosclerotic changes. No significant atherosclerotic calcifications of the popliteal artery or the tibial arteries. The timing of the contrast bolus is not optimized for evaluation of the arteries. Unremarkable appearance of the right lower extremity superficial veins. Right-sided deep veins are not well evaluated on the current study. Musculoskeletal: Fat attenuation of the musculature of the right lower extremity. Circumferential edema/swelling of the right lower extremity below the knee. No myofacial or subcutaneous gas. No acute displaced fracture.  No significant degenerative changes. Review of the MIP images confirms the above findings. IMPRESSION: Chest: CT angiogram is negative for pulmonary emboli. Geographic ground-glass throughout the lungs, frequently seen with either small vessel disease or small airway disease. Edema is favored less likely given the absence of pleural effusion and interlobular septal thickening. Hiatal hernia with essentially intrathoracic stomach. Abdomen: Fecal impaction with questionable developing stercoral colitis at the anterior rectum. Right lower extremity: Nonspecific circumferential swelling/edema of the right lower leg below the knee. No evidence of myofacial or subcutaneous gas to indicate necrotizing infection. Electronically Signed   By: Corrie Mckusick D.O.   On: 08/03/2017 20:19       LAB RESULTS: Basic Metabolic Panel: Recent Labs   Lab 08/05/17 0327 08/06/17 0507  NA 137 139  K 3.9 3.9  CL 105 104  CO2 26 25  GLUCOSE 90 96  BUN 10 16  CREATININE 0.61 0.73  CALCIUM 8.5* 8.8*   Liver Function Tests: Recent Labs  Lab 08/03/17 1547  AST 16  ALT 19  ALKPHOS 120  BILITOT 0.4  PROT 6.9  ALBUMIN 3.9  No results for input(s): LIPASE, AMYLASE in the last 168 hours. No results for input(s): AMMONIA in the last 168 hours. CBC: Recent Labs  Lab 08/03/17 1547  08/05/17 0327 08/06/17 0507  WBC 5.8   < > 4.0 4.3  NEUTROABS 3.3  --   --   --   HGB 12.5*   < > 9.8* 11.0*  HCT 38.4*   < > 30.6* 34.2*  MCV 79.3   < > 81.0 80.5  PLT 260   < > 192 217   < > = values in this interval not displayed.   Cardiac Enzymes: No results for input(s): CKTOTAL, CKMB, CKMBINDEX, TROPONINI in the last 168 hours. BNP: Invalid input(s): POCBNP CBG: Recent Labs  Lab 08/06/17 0748 08/06/17 1203  GLUCAP 90 136*      Disposition and Follow-up: Discharge Instructions    Diet - low sodium heart healthy   Complete by:  As directed    Increase activity slowly   Complete by:  As directed        DISPOSITION: [place]    Chesley Noon, MD. Schedule an appointment as soon as possible for a visit in 2 week(s).   Specialty:  Family Medicine Contact information: [place] [address] [phone]            Time coordinating discharge:  33 minutes  Signed:   Estill Cotta M.D. [facility] 08/06/2017, 12:35 PM Pager: [phone]

## 2017-08-06 NOTE — Care Management (Signed)
Case manager contacted [name] [phone], who arranges care for patient. She said that patient's care provider will be at his apartment at 3pm today to receive patient. Case manager will call [facility] at 2:45pm for 3pm pickup. CM will notify patient of same.  CM has notified Ginger, Agricultural consultant on 5W and will inform patient's bedside RN.

## 2017-08-06 NOTE — Care Management (Signed)
Case manager contacted [facility]: [phone], patient scheduled for transport home, pickup time is 3:30pm.

## 2017-08-06 NOTE — Progress Notes (Signed)
Nsg Discharge Note  Admit Date:  08/03/2017 Discharge date: 08/06/2017   Thomas Ramos to be D/C'd Home per MD order.  AVS completed.  Copy for chart, and copy for patient signed, and dated. Patient/caregiver able to verbalize understanding.  Discharge Medication: Allergies as of 08/06/2017      Reactions   Penicillins Hives, Nausea And Vomiting, Other (See Comments)   Patient tolerated cefazolin in 2017 Has patient had a PCN reaction causing immediate rash, facial/tongue/throat swelling, SOB or lightheadedness with hypotension: Yes Has patient had a PCN reaction causing severe rash involving mucus membranes or skin necrosis: No Has patient had a PCN reaction that required hospitalization No Has patient had a PCN reaction occurring within the last 10 years: Yes If all of the above answers are "NO", then may proceed with Cephalosporin use.   Sulfamethoxazole-trimethoprim Nausea And Vomiting   Metronidazole Nausea And Vomiting      Medication List    STOP taking these medications   amLODipine 10 MG tablet Commonly known as:  NORVASC   cephALEXin 500 MG capsule Commonly known as:  KEFLEX   valsartan 80 MG tablet Commonly known as:  DIOVAN     TAKE these medications   acidophilus Caps capsule Take 2 capsules by mouth daily for 15 days.   budesonide-formoterol 80-4.5 MCG/ACT inhaler Commonly known as:  SYMBICORT Inhale 2 puffs into the lungs 2 (two) times daily.   clindamycin 300 MG capsule Commonly known as:  CLEOCIN Take 1 capsule (300 mg total) by mouth 3 (three) times daily for 7 days.   furosemide 20 MG tablet Commonly known as:  LASIX Take 20 mg by mouth daily.   latanoprost 0.005 % ophthalmic solution Commonly known as:  XALATAN Place 1 drop into both eyes at bedtime.   metFORMIN 500 MG 24 hr tablet Commonly known as:  GLUCOPHAGE-XR Take 1,000 mg by mouth at bedtime.   montelukast 10 MG tablet Commonly known as:  SINGULAIR Take 10 mg by mouth daily.    naproxen sodium 220 MG tablet Commonly known as:  ALEVE Take 220-440 mg by mouth 2 (two) times daily as needed (for pain).   ondansetron 4 MG tablet Commonly known as:  ZOFRAN Take 1 tablet (4 mg total) by mouth every 6 (six) hours. What changed:    when to take this  reasons to take this   polyethylene glycol packet Commonly known as:  MIRALAX / GLYCOLAX Take 17 g by mouth daily as needed for mild constipation or moderate constipation.   potassium chloride 10 MEQ tablet Commonly known as:  K-DUR Take 10 mEq by mouth daily.   rosuvastatin 5 MG tablet Commonly known as:  CRESTOR Take 5 mg by mouth at bedtime.   timolol 0.5 % ophthalmic solution Commonly known as:  TIMOPTIC Place 1 drop into both eyes every 12 (twelve) hours.       Discharge Assessment: Vitals:   08/06/17 0605 08/06/17 0818  BP: 105/68   Pulse: (!) 50   Resp: 18   Temp: 97.9 F (36.6 C)   SpO2: 99% 99%   Skin clean, dry and intact without evidence of skin break down, no evidence of skin tears noted. IV catheter discontinued intact. Site without signs and symptoms of complications - no redness or edema noted at insertion site, patient denies c/o pain - only slight tenderness at site.  Dressing with slight pressure applied.  D/c Instructions-Education: Discharge instructions given to patient/family with verbalized understanding. D/c education completed with patient/family  including follow up instructions, medication list, d/c activities limitations if indicated, with other d/c instructions as indicated by MD - patient able to verbalize understanding, all questions fully answered. Patient instructed to return to ED, call 911, or call MD for any changes in condition.  Patient discharged via [facility] which arrived at 1540 to take patient home.  Salley Slaughter, RN 08/06/2017 3:39 PM

## 2017-08-08 LAB — CULTURE, BLOOD (ROUTINE X 2)
Culture: NO GROWTH
Culture: NO GROWTH

## 2017-08-11 ENCOUNTER — Encounter (HOSPITAL_BASED_OUTPATIENT_CLINIC_OR_DEPARTMENT_OTHER): Payer: Medicare Other

## 2017-08-26 ENCOUNTER — Encounter

## 2017-08-26 ENCOUNTER — Encounter: Payer: Self-pay | Admitting: Gastroenterology

## 2017-08-26 ENCOUNTER — Ambulatory Visit (INDEPENDENT_AMBULATORY_CARE_PROVIDER_SITE_OTHER): Payer: Medicare Other | Admitting: Gastroenterology

## 2017-08-26 VITALS — BP 108/76 | HR 94 | Ht 65.0 in | Wt 163.0 lb

## 2017-08-26 DIAGNOSIS — R1310 Dysphagia, unspecified: Secondary | ICD-10-CM | POA: Diagnosis not present

## 2017-08-26 DIAGNOSIS — K5909 Other constipation: Secondary | ICD-10-CM | POA: Diagnosis not present

## 2017-08-26 NOTE — Progress Notes (Signed)
Thomas Ramos GI Progress Note  Chief Complaint: Dysphagia  Subjective  History:  This is a 47 year old man I last saw in July 2018 with a history of dysphagia and achalasia that Dr. Deatra Ina had previously treated with Botox injection.  He has had periodic hospitalizations including those for pneumonia when there has been concern for aspiration given his underlying esophageal problems. Thomas Ramos called [place] in November of last year after visit to the emergency department for abdominal pain.  Chart review indicated he had a large rectosigmoid fecal impaction.  He did not receive any therapy for that in the emergency department, and I recommended fleets enemas and MiraLAX to be given at home, with the use of home health if needed.  The distal esophageal wall looks thickened on CT scan, and I recommended upper endoscopy with possible Botox injection.  That was scheduled for early January, however then canceled when Thomas Ramos was hospitalized in December for bronchitis and possible CHF. Swallow study was done with a speech pathologist, showing weak swallow function as a result of his cerebral palsy.  They recommended a special consistency diet to reduce the risk of aspiration.  At that time he also went home on supplemental oxygen because of the respiratory infection and pulmonary edema.  I did want to see him back in the office to reevaluate his condition and consider upper endoscopy for his dysphagia.  He was either not able to make her had to cancel a couple of appointments since then and was able to come to the office today. Thomas Ramos was admitted to the hospital 3 times in May for right leg cellulitis.  He was then seen in primary care follow-up on June 6, and I reviewed that note.  A CT angiogram was done looking for pulmonary embolism, which was reportedly negative.  He again had fecal impaction and swelling of the right leg.  Imaging was apparently inconclusive about DVT, and he was treated with  clindamycin.  Thomas Ramos reports that he generally eats "what I want", and then proceeded to list all the foods he can eat.  He has dysphagia to some solids but not liquids.  He does not feel as if he has regurgitation.  Despite CT findings as noted above, he reports that he has a bowel movement every day.  ROS: Cardiovascular:  no chest pain Respiratory: no dyspnea No dysuria. Remainder of systems negative except as above  The patient's Past Medical, Family and Social History were reviewed and are on file in the EMR.  Objective:  Med list reviewed  Current Outpatient Medications:  .  budesonide-formoterol (SYMBICORT) 80-4.5 MCG/ACT inhaler, Inhale 2 puffs into the lungs 2 (two) times daily., Disp: , Rfl:  .  furosemide (LASIX) 20 MG tablet, Take 20 mg by mouth daily., Disp: , Rfl: 1 .  latanoprost (XALATAN) 0.005 % ophthalmic solution, Place 1 drop into both eyes at bedtime., Disp: , Rfl:  .  metFORMIN (GLUCOPHAGE) 500 MG tablet, Take by mouth 2 (two) times daily with a meal., Disp: , Rfl:  .  montelukast (SINGULAIR) 10 MG tablet, Take 10 mg by mouth daily. , Disp: , Rfl:  .  naproxen sodium (ALEVE) 220 MG tablet, Take 220-440 mg by mouth 2 (two) times daily as needed (for pain)., Disp: , Rfl:  .  ondansetron (ZOFRAN) 4 MG tablet, Take 1 tablet (4 mg total) by mouth every 6 (six) hours. (Patient taking differently: Take 4 mg by mouth every 6 (six) hours as needed for nausea  or vomiting. ), Disp: 12 tablet, Rfl: 0 .  potassium chloride (K-DUR) 10 MEQ tablet, Take 10 mEq by mouth daily., Disp: , Rfl:  .  rosuvastatin (CRESTOR) 5 MG tablet, Take 5 mg by mouth at bedtime., Disp: , Rfl:  .  timolol (TIMOPTIC) 0.5 % ophthalmic solution, Place 1 drop into both eyes every 12 (twelve) hours., Disp: , Rfl: 99   Vital signs in last 24 hrs: Vitals:   08/26/17 1325  BP: 108/76  Pulse: 94    Physical Exam  Pleasant man, dysarthric, spastic contractions, wheelchair-bound, alert and  conversational  HEENT: sclera anicteric, oral mucosa moist without lesions  neck: supple, no thyromegaly,or lymphadenopathy  Cardiac: RRR without murmurs, S1S2 heard, right leg edema  Pulm: clear to auscultation bilaterally, normal RR and effort noted.  Breathing comfortably on room air  Abdomen: soft, no tenderness, with active bowel sounds. No guarding or palpable hepatosplenomegaly.  Exam limited by positioning and body habitus  Skin; warm and dry, no jaundice or rash  Recent Labs:  CBC Latest Ref Rng & Units 08/06/2017 08/05/2017 08/04/2017  WBC 4.0 - 10.5 K/uL 4.3 4.0 4.7  Hemoglobin 13.0 - 17.0 g/dL 11.0(L) 9.8(L) 11.2(L)  Hematocrit 39.0 - 52.0 % 34.2(L) 30.6(L) 34.8(L)  Platelets 150 - 400 K/uL 217 192 196    CMP Latest Ref Rng & Units 08/06/2017 08/05/2017 08/04/2017  Glucose 65 - 99 mg/dL 96 90 -  BUN 6 - 20 mg/dL 16 10 -  Creatinine 0.61 - 1.24 mg/dL 0.73 0.61 -  Sodium 135 - 145 mmol/L 139 137 -  Potassium 3.5 - 5.1 mmol/L 3.9 3.9 4.4  Chloride 101 - 111 mmol/L 104 105 -  CO2 22 - 32 mmol/L 25 26 -  Calcium 8.9 - 10.3 mg/dL 8.8(L) 8.5(L) -  Total Protein 6.5 - 8.1 g/dL - - -  Total Bilirubin 0.3 - 1.2 mg/dL - - -  Alkaline Phos 38 - 126 U/L - - -  AST 15 - 41 U/L - - -  ALT 17 - 63 U/L - - -    Radiologic studies:  CT angiogram 08/03/2017 was personally reviewed.  The report is on file.  Groundglass opacity in both lungs.  Swelling right leg, large amount of rectosigmoid stool with impaction and possible thickening of the anterior rectal wall.  He also has a large hiatal hernia with essentially intrathoracic stomach.  @ASSESSMENTPLANBEGIN @ Assessment: Encounter Diagnoses  Name Primary?  Marland Kitchen Dysphagia, unspecified type Yes  . Chronic constipation    Thomas Ramos has a history of achalasia-like picture, not confirmed on manometry.  Previous Botox performed with symptomatic improvement. 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   It may be difficult to determine this given the size of his hiatal hernia.  Feels as if he may be minimizing his symptoms.  I am concerned that if his esophageal emptying is delayed, it may be causing regurgitation and perhaps to the point of aspiration.  I do not know how [place] to treat his constipation since we had previously recommended MiraLAX that he did not like to take, and he felt that enema treatments were unfeasible given his CP and lack of home health aide.  I wonder if he may be having ongoing passage of bowel contents around an impaction.   Plan:  Upper endoscopy, possible Botox injection if findings indicate EG junction outflow obstruction. I do not think he is a surgical candidate for his large hiatal hernia given  his body habitus and lack of mobility. He is agreeable to upper endoscopy.  I will plan to perform in the hospital endoscopy lab given his conditions.  The benefits and risks of the planned procedure were described in detail with the patient or (when appropriate) their health care proxy.  Risks were outlined as including, but not limited to, bleeding, infection, perforation, adverse medication reaction leading to cardiac or pulmonary decompensation, or pancreatitis (if ERCP).  The limitation of incomplete mucosal visualization was also discussed.  No guarantees or warranties were given.  Patient at increased risk for cardiopulmonary complications of procedure due to medical comorbidities.    Total time 40 minutes, over half spent face-to-face with patient in counseling and coordination of care.   [name]

## 2017-08-26 NOTE — Telephone Encounter (Signed)
Error

## 2017-08-26 NOTE — Patient Instructions (Signed)
If you are age 45 or older, your body mass index should be between 23-30. Your Body mass index is 27.12 kg/m. If this is out of the aforementioned range listed, please consider follow up with your Primary Care Provider.  If you are age 57 or younger, your body mass index should be between 19-25. Your Body mass index is 27.12 kg/m. If this is out of the aformentioned range listed, please consider follow up with your Primary Care Provider.   You have been scheduled for an endoscopy. Please follow written instructions given to you at your visit today. If you use inhalers (even only as needed), please bring them with you on the day of your procedure. Your physician has requested that you go to www.startemmi.com and enter the access code given to you at your visit today. This web site gives a general overview about your procedure. However, you should still follow specific instructions given to you by our office regarding your preparation for the procedure.  It was a pleasure to see you today!  Dr. Loletha Carrow

## 2017-08-30 ENCOUNTER — Other Ambulatory Visit: Payer: Self-pay

## 2017-08-30 ENCOUNTER — Emergency Department (HOSPITAL_COMMUNITY)
Admission: EM | Admit: 2017-08-30 | Discharge: 2017-08-31 | Disposition: A | Discharge status: Home / Self Care | Payer: Medicare Other | Attending: Emergency Medicine | Admitting: Emergency Medicine

## 2017-08-30 ENCOUNTER — Encounter (HOSPITAL_COMMUNITY): Payer: Self-pay | Admitting: *Deleted

## 2017-08-30 ENCOUNTER — Emergency Department (HOSPITAL_COMMUNITY): Payer: Medicare Other

## 2017-08-30 DIAGNOSIS — I110 Hypertensive heart disease with heart failure: Secondary | ICD-10-CM | POA: Insufficient documentation

## 2017-08-30 DIAGNOSIS — I5032 Chronic diastolic (congestive) heart failure: Secondary | ICD-10-CM | POA: Insufficient documentation

## 2017-08-30 DIAGNOSIS — I872 Venous insufficiency (chronic) (peripheral): Secondary | ICD-10-CM

## 2017-08-30 DIAGNOSIS — Z7984 Long term (current) use of oral hypoglycemic drugs: Secondary | ICD-10-CM | POA: Diagnosis not present

## 2017-08-30 DIAGNOSIS — Z79899 Other long term (current) drug therapy: Secondary | ICD-10-CM | POA: Diagnosis not present

## 2017-08-30 DIAGNOSIS — R2241 Localized swelling, mass and lump, right lower limb: Secondary | ICD-10-CM | POA: Diagnosis present

## 2017-08-30 DIAGNOSIS — E119 Type 2 diabetes mellitus without complications: Secondary | ICD-10-CM | POA: Insufficient documentation

## 2017-08-30 LAB — CBC WITH DIFFERENTIAL/PLATELET
BASOS PCT: 1 %
Basophils Absolute: 0 10*3/uL (ref 0.0–0.1)
Eosinophils Absolute: 0.1 10*3/uL (ref 0.0–0.7)
Eosinophils Relative: 1 %
HEMATOCRIT: 39.3 % (ref 39.0–52.0)
Hemoglobin: 12.7 g/dL — ABNORMAL LOW (ref 13.0–17.0)
Lymphocytes Relative: 30 %
Lymphs Abs: 1.9 10*3/uL (ref 0.7–4.0)
MCH: 26.1 pg (ref 26.0–34.0)
MCHC: 32.3 g/dL (ref 30.0–36.0)
MCV: 80.9 fL (ref 78.0–100.0)
MONO ABS: 0.5 10*3/uL (ref 0.1–1.0)
MONOS PCT: 7 %
NEUTROS ABS: 3.9 10*3/uL (ref 1.7–7.7)
Neutrophils Relative %: 61 %
Platelets: 217 10*3/uL (ref 150–400)
RBC: 4.86 MIL/uL (ref 4.22–5.81)
RDW: 13.3 % (ref 11.5–15.5)
WBC: 6.4 10*3/uL (ref 4.0–10.5)

## 2017-08-30 LAB — COMPREHENSIVE METABOLIC PANEL
ALBUMIN: 4.5 g/dL (ref 3.5–5.0)
ALT: 17 U/L (ref 17–63)
ANION GAP: 9 (ref 5–15)
AST: 17 U/L (ref 15–41)
Alkaline Phosphatase: 122 U/L (ref 38–126)
BILIRUBIN TOTAL: 0.5 mg/dL (ref 0.3–1.2)
BUN: 15 mg/dL (ref 6–20)
CO2: 30 mmol/L (ref 22–32)
Calcium: 9.7 mg/dL (ref 8.9–10.3)
Chloride: 104 mmol/L (ref 101–111)
Creatinine, Ser: 0.74 mg/dL (ref 0.61–1.24)
GLUCOSE: 103 mg/dL — AB (ref 65–99)
POTASSIUM: 3.9 mmol/L (ref 3.5–5.1)
Sodium: 143 mmol/L (ref 135–145)
TOTAL PROTEIN: 7.7 g/dL (ref 6.5–8.1)

## 2017-08-30 NOTE — Discharge Instructions (Addendum)
Try to keep your right leg elevated to help with swelling. Use compression stockings to help with swelling.  Contact a health care provider if: You have redness, swelling, or more pain in the affected area. You see a red streak or line that extends up or down from the affected area. You have skin breakdown or a loss of skin in the affected area, even if the breakdown is small. You get an injury in the affected area. Get help right away if: You get an injury and an open wound in the affected area. You have severe pain that does not get better with medicine. You have sudden numbness or weakness in the foot or ankle below the affected area, or you have trouble moving your foot or ankle. You have a fever and you have worse or persistent symptoms. You have chest pain. You have shortness of breath.  Get help right away if: You have new or increased pain, swelling, or redness in an arm or leg. You have numbness or tingling in an arm or leg. You have shortness of breath. You have chest pain. You have a rapid or irregular heartbeat. You feel light-headed or dizzy. You cough up blood. There is blood in your vomit, stool, or urine. You have a serious fall or accident, or you hit your head. You have a severe headache or confusion. You have a cut that will not stop bleeding.  Get help right away if: Your symptoms get worse. You feel very sleepy. You throw up (vomit) or have watery poop (diarrhea) for a long time. There are red streaks coming from the infected area. Your red area gets larger. Your red area turns darker. Get help right away if: You have shortness of breath or chest pain. You cannot breathe when you lie down. You have pain, redness, or warmth in the swollen areas. You have heart, liver, or kidney disease and get edema all of a sudden. You have a fever and your symptoms get worse all of a sudden.

## 2017-08-30 NOTE — ED Notes (Signed)
Patient transported to X-ray 

## 2017-08-30 NOTE — ED Notes (Signed)
Pt provided with sandwich and coke.Pt repositioned in bed. Pt has no other complaints at this time.

## 2017-08-30 NOTE — ED Triage Notes (Signed)
Pt bib [facility] and presents with increased right lower extremity swelling and redness.  [facility] reports that the leg is hot. Pt is from home but came [place] ED [place] a [facility].   Hx of cerebral palsy  [facility] VS: BP: 150/90, HR: 130, 98% RA, RR: 18

## 2017-08-30 NOTE — ED Provider Notes (Signed)
[facility] DEPT Provider Note   CSN: [id number] Arrival date & time: 08/30/17  1303     History   Chief Complaint No chief complaint on file. Chief complaint: Right leg pain and swelling  HPI Thomas Ramos is a 47 y.o. male with a history of cerebral palsy, cellulitis, Quadriplegic spinal paralysis presenting for 3 weeks of right lower extremity swelling and pain patient was treated for cellulitis of the right leg at [facility] on May 21.  Patient was treated with IV clindamycin and sent home with clindamycin p.o.  Patient states that he took his full regimen of antibiotics however he is still concerned because his leg is still red and swollen.  Patient states that he is having minimal pain and is primarily concerned about the appearance of the leg.  Patient denies all other symptoms, denies fever, abdominal pain, chest pain, shortness of breath, headache, syncope.  HPI  Past Medical History:  Diagnosis Date  . Anxiety   . Cellulitis 08/04/2017  . Cerebral palsy ([place])   . Chronic diastolic (congestive) heart failure ([place])   . CP (cerebral palsy) ([place])   . Diabetes mellitus without complication (HCC)    borderline  . Environmental allergies    takes inhalers if needed  . Esophageal stricture   . GERD (gastroesophageal reflux disease)   . Hypertension   . Motility disorder, esophageal   . Pneumonia   . 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[place])   . S/P Botox injection    approx every 4 months  . Seasonal allergies     Patient Active Problem List   Diagnosis Date Noted  . Cellulitis of right lower extremity without foot 08/03/2017  . Type 2 diabetes mellitus with complication, without long-term current use of insulin ([place]) 08/03/2017  . PNA (pneumonia) 05/07/2017  . SIRS (systemic inflammatory response syndrome) (HCC)   . Acute respiratory failure with hypoxia ([place]) 03/13/2017  . 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[place])   . Rhinovirus infection  08/17/2016  . Pressure injury of skin 08/16/2016  . Acute respiratory distress 08/15/2016  . Tachycardia 08/15/2016  . Sepsis ([place]) 08/15/2016  . Onychomycosis 02/20/2016  . Essential hypertension   . OSA (obstructive sleep apnea) 12/11/2015  . Chronic diastolic (congestive) heart failure ([place])   . Chronic venous insufficiency   . Depression with anxiety 09/20/2015  . CAP (community acquired pneumonia) 09/20/2015  . Hypersomnia 07/16/2015  . Asthma exacerbation 07/16/2015  . GERD (gastroesophageal reflux disease) 07/16/2015  . Hematemesis with nausea   . Fever 05/24/2014  . Dysphagia 09/16/2010  . 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 BOTOX INJECTION N/A 12/21/2012   Procedure: BOTOX INJECTION;  Surgeon: Inda Castle, MD;  Location: [facility];  Service: Endoscopy;  Laterality: N/A;  . BOTOX INJECTION N/A 06/28/2014   Procedure: BOTOX INJECTION;  Surgeon: Inda Castle, MD;  Location: [place];  Service: Endoscopy;  Laterality: N/A;  . ESOPHAGOGASTRODUODENOSCOPY N/A 12/21/2012   Procedure: ESOPHAGOGASTRODUODENOSCOPY (EGD);  Surgeon: Inda Castle, MD;  Location: [facility];  Service: Endoscopy;  Laterality: N/A;  . ESOPHAGOGASTRODUODENOSCOPY N/A 06/28/2014   Procedure: ESOPHAGOGASTRODUODENOSCOPY (EGD);  Surgeon: Inda Castle, MD;  Location: [place];  Service: Endoscopy;  Laterality: N/A;  . ESOPHAGUS SURGERY     stretched esophagus  . legs    . MOUTH SURGERY    . TENDON RELEASE          Home Medications  Prior to Admission medications   Medication Sig Start Date End Date Taking? Authorizing Provider  budesonide-formoterol (SYMBICORT) 80-4.5 MCG/ACT inhaler Inhale 2 puffs into the lungs 2 (two) times daily.   Yes [provider]  [facility] CLOTRIMAZOLE 1 % cream Apply 1 application topically 2 (two) times daily. BETWEEN TOES 08/19/17  Yes [provider]  furosemide (LASIX) 20 MG  tablet Take 20 mg by mouth daily. 07/15/17  Yes [provider]  latanoprost (XALATAN) 0.005 % ophthalmic solution Place 1 drop into both eyes daily as needed (dry eyes).    Yes [provider]  metFORMIN (GLUCOPHAGE) 500 MG tablet Take 500-1,000 mg by mouth daily.    Yes [provider]  montelukast (SINGULAIR) 10 MG tablet Take 10 mg by mouth daily.  08/12/16  Yes [provider]  ondansetron (ZOFRAN) 4 MG tablet Take 1 tablet (4 mg total) by mouth every 6 (six) hours. Patient taking differently: Take 4 mg by mouth every 6 (six) hours as needed for nausea or vomiting.  02/05/17  Yes Pollina, Gwenyth Allegra, MD  potassium chloride (K-DUR) 10 MEQ tablet Take 10 mEq by mouth daily.   Yes [provider]  rosuvastatin (CRESTOR) 5 MG tablet Take 5 mg by mouth at bedtime.   Yes [provider]  naproxen sodium (ALEVE) 220 MG tablet Take 220-440 mg by mouth 2 (two) times daily as needed (for pain).    [provider]    Family History Family History  Problem Relation Age of Onset  . Hypertension Father   . Lung cancer Father   . Diabetes Mother     Social History Social History   Tobacco Use  . Smoking status: Never Smoker  . Smokeless tobacco: Former Systems developer  . Tobacco comment: smoked one time  Substance Use Topics  . Alcohol use: Yes    Comment: 2 beers every other Friday and Saturday  . Drug use: No     Allergies   Penicillins; Sulfamethoxazole-trimethoprim; and Metronidazole   Review of Systems Review of Systems  Constitutional: Negative.  Negative for chills, fatigue and fever.  HENT: Negative.  Negative for congestion, ear pain, rhinorrhea, sore throat and trouble swallowing.   Eyes: Negative.  Negative for pain and visual disturbance.  Respiratory: Negative.  Negative for cough, chest tightness and shortness of breath.   Cardiovascular: Negative.  Negative for chest pain and leg swelling.  Gastrointestinal:  Negative.  Negative for abdominal pain, blood in stool, diarrhea, nausea and vomiting.  Genitourinary: Negative.  Negative for dysuria, flank pain, hematuria, scrotal swelling and testicular pain.  Musculoskeletal: Negative.  Negative for arthralgias, myalgias and neck pain.  Skin: Positive for color change.  Neurological: Negative.  Negative for dizziness, syncope, weakness, light-headedness and headaches.     Physical Exam Updated Vital Signs BP 124/70   Pulse 75   Temp 98.2 F (36.8 C) (Oral)   Resp 18   SpO2 97%   Physical Exam  Constitutional: He is oriented to person, place, and time. He appears well-nourished. No distress.  HENT:  Head: Normocephalic and atraumatic.  Neck: Normal range of motion. Neck supple. No tracheal deviation present.  Cardiovascular: Normal rate, regular rhythm, normal heart sounds and intact distal pulses.  Pulmonary/Chest: Effort normal and breath sounds normal. No respiratory distress. He has no wheezes.  Abdominal: Soft. There is no tenderness. There is no rebound and no guarding.  Musculoskeletal: He exhibits no tenderness.       Legs: Patient with cerebral  palsy, quadriplegic status, contractures in all 4 extremities.  Severe pitting edema to the right lower leg and foot.  Neurological: He is alert and oriented to person, place, and time.  Skin: Skin is warm and dry. Capillary refill takes less than 2 seconds. He is not diaphoretic. There is erythema.  Dependent edema to the right lower leg.  Color change to right pretibial area appears to be dependent petechiae from patient's positioning.  Patient states that he is not often rolled by staff at home.  Patient states that he spends a lot of time on his right side.  Psychiatric: He has a normal mood and affect. His behavior is normal.     ED Treatments / Results  Labs (all labs ordered are listed, but only abnormal results are displayed) Labs Reviewed  CBC WITH DIFFERENTIAL/PLATELET -  Abnormal; Notable for the following components:      Result Value   Hemoglobin 12.7 (*)    All other components within normal limits  COMPREHENSIVE METABOLIC PANEL - Abnormal; Notable for the following components:   Glucose, Bld 103 (*)    All other components within normal limits  CULTURE, BLOOD (ROUTINE X 2)  CULTURE, BLOOD (ROUTINE X 2)    EKG None  Radiology Dg Tibia/fibula Right  Result Date: 08/30/2017 CLINICAL DATA:  47 year old male with cerebral palsy. Cellulitis. Right lower leg pains morning. Contractures. EXAM: RIGHT TIBIA AND FIBULA - 2 VIEW COMPARISON:  Right tib-fib series 07/20/2017. FINDINGS: Stable dysplastic appearance of the proximal and distal tibia and fibula at the knee and ankle joints. Pes planus partially visible. Osteopenia. No acute osseous abnormality identified. No knee joint effusion is evident. Generalized right lower extremity soft tissue swelling and fat stranding. No soft tissue gas. No radiopaque foreign body identified. IMPRESSION: 1.  No acute osseous abnormality identified. 2. Soft tissue swelling with no soft tissue gas or radiopaque foreign body identified. Electronically Signed   By: Genevie Ann M.D.   On: 08/30/2017 14:29   Dg Ankle Complete Right  Result Date: 08/30/2017 CLINICAL DATA:  47 year old male with cerebral palsy. Cellulitis, right lower extremity pain. Contractures. EXAM: RIGHT ANKLE - COMPLETE 3+ VIEW COMPARISON:  Right tib-fib series today, and on 07/20/2017. FINDINGS: Dysplastic osseous structures related to cerebral palsy. Associated osteopenia and pes planus. Right mortise joint alignment appears preserved. No ankle joint effusion is evident. No acute osseous abnormality identified. Generalized soft tissue swelling and stranding. No soft tissue gas. No radiopaque foreign body identified. IMPRESSION: 1.  No acute osseous abnormality identified. 2. Generalized soft tissue swelling. No soft tissue gas or radiopaque foreign body identified.  Electronically Signed   By: Genevie Ann M.D.   On: 08/30/2017 14:30    Procedures Procedures (including critical care time)  Medications Ordered in ED Medications - No data to display   Initial Impression / Assessment and Plan / ED Course  I have reviewed the triage vital signs and the nursing notes.  Pertinent labs & imaging results that were available during my care of the patient were reviewed by me and considered in my medical decision making (see chart for details).  Clinical Course as of Aug 30 1632  Mon Aug 30, 2017  1335 Patient denies abdominal pain.  States he is just here concerning his leg.   [BM]  1542 Informed by vascular that patient has had 3 inconclusive Doppler venous studies last month and they do not believe that another vascular study today will yield benefit for the patient.  Dr. Venora Maples also informed.   [BM]    Clinical Course User Index [BM] Deliah Boston, PA-C    Patient's right leg elevated in department by Dr. Venora Maples and myself.  Reevaluation approximately 1 hour later shows a decrease in swelling of the right leg.  The redness of the right leg appears to be developed from the patient's leg being left in a dependent position for long periods of time.  Patient's right leg does not display any noticeable difference in warmth from the left leg.  The decrease in swelling is reassuring for a dependent process causing the swelling.  There is no streaking or fluctuance to the right leg.  No history of recent fever or shortness of breath.  There are no cords palpable, negative Homans sign.  We believe that this is venous insufficiency.  Patient had 3 inconclusive DVT studies last month.  Patient given return precautions concerning signs of a DVT.  Patient given return precautions concerning cellulitis.  Patient encouraged to continue elevating his leg at home and having homeaide nurses change his positioning often.  Patient states understanding of return precautions and  states that he will follow-up with his primary care physician.  Patient is agreeable with plan and given physical copy of return precautions in AVS.  Dr. [name] Pippins has also seen patient and agrees with plan to discharge.  Blood cultures were also ordered during this visit, if these results are positive patient will be contacted by [facility] health. Final Clinical Impressions(s) / ED Diagnoses   Final diagnoses:  Venous insufficiency of right lower extremity    ED Discharge Orders    None       Gari Crown 08/30/17 1638    Jola Schmidt, MD 08/31/17 [id number]

## 2017-08-30 NOTE — ED Notes (Signed)
Bed: WHALD Expected date:  Expected time:  Means of arrival:  Comments: 

## 2017-08-30 NOTE — ED Notes (Signed)
PTAR notified need for transport home. Per PTAR: Pt is 25 in queue, transport will be a while. Pt updated on plan of care.

## 2017-08-30 NOTE — ED Notes (Signed)
Bed: [facility]16 Expected date:  Expected time:  Means of arrival:  Comments: 

## 2017-08-30 NOTE — ED Notes (Signed)
Bed: [facility] Expected date:  Expected time:  Means of arrival:  Comments: EMS-possible cellulitis

## 2017-09-03 ENCOUNTER — Ambulatory Visit (HOSPITAL_COMMUNITY): Payer: Medicare Other | Admitting: Certified Registered Nurse Anesthetist

## 2017-09-03 ENCOUNTER — Other Ambulatory Visit: Payer: Self-pay

## 2017-09-03 ENCOUNTER — Telehealth: Payer: Self-pay | Admitting: Podiatry

## 2017-09-03 ENCOUNTER — Ambulatory Visit (HOSPITAL_COMMUNITY)
Admission: RE | Admit: 2017-09-03 | Discharge: 2017-09-03 | Disposition: A | Discharge status: Home / Self Care | Payer: Medicare Other | Source: Ambulatory Visit | Attending: Gastroenterology | Admitting: Gastroenterology

## 2017-09-03 ENCOUNTER — Encounter (HOSPITAL_COMMUNITY): Payer: Self-pay | Admitting: Certified Registered Nurse Anesthetist

## 2017-09-03 ENCOUNTER — Ambulatory Visit (HOSPITAL_COMMUNITY)
Admission: RE | Disposition: A | Discharge status: Home / Self Care | Payer: Self-pay | Source: Ambulatory Visit | Attending: Gastroenterology

## 2017-09-03 DIAGNOSIS — F419 Anxiety disorder, unspecified: Secondary | ICD-10-CM | POA: Insufficient documentation

## 2017-09-03 DIAGNOSIS — R1314 Dysphagia, pharyngoesophageal phase: Secondary | ICD-10-CM

## 2017-09-03 DIAGNOSIS — Z7984 Long term (current) use of oral hypoglycemic drugs: Secondary | ICD-10-CM | POA: Insufficient documentation

## 2017-09-03 DIAGNOSIS — K229 Disease of esophagus, unspecified: Secondary | ICD-10-CM | POA: Insufficient documentation

## 2017-09-03 DIAGNOSIS — Q399 Congenital malformation of esophagus, unspecified: Secondary | ICD-10-CM | POA: Diagnosis not present

## 2017-09-03 DIAGNOSIS — K219 Gastro-esophageal reflux disease without esophagitis: Secondary | ICD-10-CM | POA: Insufficient documentation

## 2017-09-03 DIAGNOSIS — G809 Cerebral palsy, unspecified: Secondary | ICD-10-CM | POA: Insufficient documentation

## 2017-09-03 DIAGNOSIS — R1310 Dysphagia, unspecified: Secondary | ICD-10-CM | POA: Diagnosis present

## 2017-09-03 DIAGNOSIS — Z888 Allergy status to other drugs, medicaments and biological substances status: Secondary | ICD-10-CM | POA: Insufficient documentation

## 2017-09-03 DIAGNOSIS — E119 Type 2 diabetes mellitus without complications: Secondary | ICD-10-CM | POA: Diagnosis not present

## 2017-09-03 DIAGNOSIS — Z880 Allergy status to penicillin: Secondary | ICD-10-CM | POA: Insufficient documentation

## 2017-09-03 DIAGNOSIS — K224 Dyskinesia of esophagus: Secondary | ICD-10-CM | POA: Insufficient documentation

## 2017-09-03 DIAGNOSIS — I110 Hypertensive heart disease with heart failure: Secondary | ICD-10-CM | POA: Insufficient documentation

## 2017-09-03 DIAGNOSIS — K449 Diaphragmatic hernia without obstruction or gangrene: Secondary | ICD-10-CM | POA: Diagnosis not present

## 2017-09-03 DIAGNOSIS — I10 Essential (primary) hypertension: Secondary | ICD-10-CM | POA: Insufficient documentation

## 2017-09-03 DIAGNOSIS — Z882 Allergy status to sulfonamides status: Secondary | ICD-10-CM | POA: Insufficient documentation

## 2017-09-03 DIAGNOSIS — I5032 Chronic diastolic (congestive) heart failure: Secondary | ICD-10-CM | POA: Insufficient documentation

## 2017-09-03 HISTORY — PX: ESOPHAGOGASTRODUODENOSCOPY (EGD) WITH PROPOFOL: SHX5813

## 2017-09-03 LAB — GLUCOSE, CAPILLARY: GLUCOSE-CAPILLARY: 112 mg/dL — AB (ref 65–99)

## 2017-09-03 SURGERY — ESOPHAGOGASTRODUODENOSCOPY (EGD) WITH PROPOFOL
Anesthesia: Monitor Anesthesia Care

## 2017-09-03 MED ORDER — ONABOTULINUMTOXINA 100 UNITS IJ SOLR
INTRAMUSCULAR | Status: AC
  Filled 2017-09-03: qty 100

## 2017-09-03 MED ORDER — ONDANSETRON HCL 4 MG/2ML IJ SOLN
INTRAMUSCULAR | Status: DC | PRN
  Administered 2017-09-03: 4 mg via INTRAVENOUS

## 2017-09-03 MED ORDER — SODIUM CHLORIDE 0.9 % IJ SOLN
INTRAMUSCULAR | Status: AC
  Filled 2017-09-03: qty 10

## 2017-09-03 MED ORDER — SODIUM CHLORIDE 0.9 % IV SOLN: INTRAVENOUS | Status: DC

## 2017-09-03 MED ORDER — PROPOFOL 10 MG/ML IV BOLUS
INTRAVENOUS | Status: AC
  Filled 2017-09-03: qty 40

## 2017-09-03 MED ORDER — PROPOFOL 500 MG/50ML IV EMUL
INTRAVENOUS | Status: DC | PRN
  Administered 2017-09-03: 20 mg via INTRAVENOUS
  Administered 2017-09-03: 40 mg via INTRAVENOUS

## 2017-09-03 MED ORDER — LACTATED RINGERS IV SOLN
INTRAVENOUS | Status: DC | PRN
Start: 2017-09-03 — End: 2017-09-03
  Administered 2017-09-03: 11:00:00 via INTRAVENOUS

## 2017-09-03 MED ORDER — PROPOFOL 500 MG/50ML IV EMUL
INTRAVENOUS | Status: DC | PRN
  Administered 2017-09-03: 75 ug/kg/min via INTRAVENOUS

## 2017-09-03 MED ORDER — LIDOCAINE HCL (CARDIAC) PF 100 MG/5ML IV SOSY
PREFILLED_SYRINGE | INTRAVENOUS | Status: DC | PRN
  Administered 2017-09-03: 100 mg via INTRATRACHEAL

## 2017-09-03 SURGICAL SUPPLY — 15 items

## 2017-09-03 NOTE — Op Note (Signed)
[facility] Patient Name: Thomas Ramos Procedure Date: 09/03/2017 MRN: [id number] Attending MD: Estill Cotta. Loletha Carrow , MD Date of Birth: [birth year] CSN: [id number] Age: 47 Admit Type: Inpatient Procedure:                Upper GI endoscopy Indications:              Dysphagia, concern for possible aspiration, history                            of hiatal hernia and esophageal motility disorder                            previously treated with BoTox of LES (last time                            2014)                           cerebral palsy Providers:                Mallie Mussel L. Loletha Carrow, MD, Baird Cancer, RN, Tinnie Gens,                            Technician, Arnoldo Hooker, CRNA Referring MD:              Medicines:                Monitored Anesthesia Care Complications:            No immediate complications. Estimated Blood Loss:     Estimated blood loss: none. Procedure:                Pre-Anesthesia Assessment:                           - Prior to the procedure, a History and Physical                            was performed, and patient medications and                            allergies were reviewed. The patient's tolerance of                            previous anesthesia was also reviewed. The risks                            and benefits of the procedure and the sedation                            options and risks were discussed with the patient.                            All questions were answered, and informed consent                            was obtained. Prior Anticoagulants:  The patient has                            taken no previous anticoagulant or antiplatelet                            agents. ASA Grade Assessment: III - A patient with                            severe systemic disease. After reviewing the risks                            and benefits, the patient was deemed in                            satisfactory condition to undergo the procedure.                 After obtaining informed consent, the endoscope was                            passed under direct vision. Throughout the                            procedure, the patient's blood pressure, pulse, and                            oxygen saturations were monitored continuously. The                            [id number] ) was introduced through the                            mouth, and advanced to the second part of duodenum.                            The upper GI endoscopy was accomplished without                            difficulty. The patient tolerated the procedure                            well. Scope In: Scope Out: Findings:      A large hiatal hernia was present (1/4 to 1/3 stomach in chest).      The mid esophagus and distal esophagus were significantly tortuous. No       resistance passing scope through EGJ      The stomach was normal.      The cardia and gastric fundus were normal on retroflexion (other than       hiatal hernia).      The examined duodenum was normal. Impression:               - Large hiatal hernia.                           - Tortuous  esophagus.                           - Normal stomach.                           - Normal examined duodenum.                           - No specimens collected.                           There is no EGJ outflow obstruction. The large                            hiatal hernia with esophageal tortuosity (and                            probable impaired motility) in setting of CP and                            patient's body habitus increase the risk of                            aspiration. He has been evaluated by SLP in the                            past and diet recommendations given. If aspiration                            remains on ongoing concern, a gastrostomy tube for                            total liquid nutrition would be necessary. The                            patient is not inclined to  do so. If ever                            considered, it would require placement by                            interventional radiology or surgery due to the                            large hiatal hernia. Moderate Sedation:      N/A- Per Anesthesia Care Recommendation:           - Patient has a contact number available for                            emergencies. The signs and symptoms of potential                            delayed complications were discussed with the  patient. Return to normal activities tomorrow.                            Written discharge instructions were provided to the                            patient.                           - Resume previous diet.                           - Continue present medications. Procedure Code(s):        --- Professional ---                           [phone], Esophagogastroduodenoscopy, flexible,                            transoral; diagnostic, including collection of                            specimen(s) by brushing or washing, when performed                            (separate procedure) Diagnosis Code(s):        --- Professional ---                           K44.9, Diaphragmatic hernia without obstruction or                            gangrene                           Q39.9, Congenital malformation of esophagus,                            unspecified                           R13.10, Dysphagia, unspecified CPT copyright 2017 American Medical Association. All rights reserved. The codes documented in this report are preliminary and upon coder review may  be revised to meet current compliance requirements. Hser Belanger L. Loletha Carrow, MD 09/03/2017 11:18:50 AM This report has been signed electronically. Number of Addenda: 0

## 2017-09-03 NOTE — H&P (Signed)
History:  This patient presents for endoscopic testing for dysphagia. See office note from last week.  Thomas Ramos Referring physician: Chesley Noon, MD  Past Medical History: Past Medical History:  Diagnosis Date  . Anxiety   . Cellulitis 08/04/2017  . Cerebral palsy ([place])   . Chronic diastolic (congestive) heart failure ([place])   . CP (cerebral palsy) ([place])   . Diabetes mellitus without complication (HCC)    borderline  . Environmental allergies    takes inhalers if needed  . Esophageal stricture   . GERD (gastroesophageal reflux disease)   . Hypertension   . Motility disorder, esophageal   . Pneumonia   . 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[place])   . S/P Botox injection    approx every 4 months  . Seasonal allergies      Past Surgical History: Past Surgical History:  Procedure Laterality Date  . BOTOX INJECTION N/A 12/21/2012   Procedure: BOTOX INJECTION;  Surgeon: Inda Castle, MD;  Location: [facility];  Service: Endoscopy;  Laterality: N/A;  . BOTOX INJECTION N/A 06/28/2014   Procedure: BOTOX INJECTION;  Surgeon: Inda Castle, MD;  Location: [place];  Service: Endoscopy;  Laterality: N/A;  . ESOPHAGOGASTRODUODENOSCOPY N/A 12/21/2012   Procedure: ESOPHAGOGASTRODUODENOSCOPY (EGD);  Surgeon: Inda Castle, MD;  Location[facility];  Service: Endoscopy;  Laterality: N/A;  . ESOPHAGOGASTRODUODENOSCOPY N/A 06/28/2014   Procedure: ESOPHAGOGASTRODUODENOSCOPY (EGD);  Surgeon: Inda Castle, MD;  Location: [place];  Service: Endoscopy;  Laterality: N/A;  . ESOPHAGUS SURGERY     stretched esophagus  . legs    . MOUTH SURGERY    . TENDON RELEASE      Allergies: Allergies  Allergen Reactions  . Penicillins Hives, Nausea And Vomiting and Other (See Comments)    Patient tolerated cefazolin in 2017 Has patient had a PCN reaction causing immediate rash, facial/tongue/throat swelling, SOB or lightheadedness with hypotension: Yes Has patient had a PCN  reaction causing severe rash involving mucus membranes or skin necrosis: No Has patient had a PCN reaction that required hospitalization No Has patient had a PCN reaction occurring within the last 10 years: Yes If all of the above answers are "NO", then may proceed with Cephalosporin use.  . Sulfamethoxazole-Trimethoprim Nausea And Vomiting  . Metronidazole Nausea And Vomiting    Outpatient Meds: Current Facility-Administered Medications  Medication Dose Route Frequency Provider Last Rate Last Dose  . 0.9 %  sodium chloride infusion   Intravenous Continuous Danis, Kirke Corin, MD       Facility-Administered Medications Ordered in Other Encounters  Medication Dose Route Frequency Provider Last Rate Last Dose  . lactated ringers infusion    Continuous PRN Key, Kristopher, CRNA          ___________________________________________________________________ Objective   Exam:  BP 119/88   Pulse 95   Resp (!) 30   SpO2 98%  Spasticity limbs and dysarthria (CP)  CV: RRR without murmur, S1/S2, no JVD, no peripheral edema  Resp: low lung volumes due to body habitus, clear to auscultation bilaterally, normal RR and effort noted  GI: soft, no tenderness, with active bowel sounds. No guarding or palpable organomegaly noted.   Assessment:  Dysphagia, known hiatal hernia, esophageal motility disorder  Plan:  EGD with possible BoTox of LES   [name]

## 2017-09-03 NOTE — Transfer of Care (Signed)
Immediate Anesthesia Transfer of Care Note  Patient: Thomas Ramos  Procedure(s) Performed: ESOPHAGOGASTRODUODENOSCOPY (EGD) WITH PROPOFOL (N/A )  Patient Location: PACU and Endoscopy Unit  Anesthesia Type:MAC  Level of Consciousness: awake  Airway & Oxygen Therapy: Patient Spontanous Breathing and Patient connected to nasal cannula oxygen  Post-op Assessment: Report given to RN and Post -op Vital signs reviewed and stable  Post vital signs: Reviewed and stable  Last Vitals:  Vitals Value Taken Time  BP 132/82 09/03/2017 11:15 AM  Temp    Pulse 87 09/03/2017 11:15 AM  Resp 28 09/03/2017 11:15 AM  SpO2 100 % 09/03/2017 11:15 AM  Vitals shown include unvalidated device data.  Last Pain:  Vitals:   09/03/17 1043  TempSrc: Oral  PainSc: 0-No pain         Complications: No apparent anesthesia complications

## 2017-09-03 NOTE — Discharge Instructions (Signed)
YOU HAD AN ENDOSCOPIC PROCEDURE TODAY: Refer to the procedure report and other information in the discharge instructions given to you for any specific questions about what was found during the examination. If this information does not answer your questions, please call [facility] office at [phone] to clarify.  ° °YOU SHOULD EXPECT: Some feelings of bloating in the abdomen. Passage of more gas than usual. Walking can help get rid of the air that was put into your GI tract during the procedure and reduce the bloating. If you had a lower endoscopy (such as a colonoscopy or flexible sigmoidoscopy) you may notice spotting of blood in your stool or on the toilet paper. Some abdominal soreness may be present for a day or two, also. ° °DIET: Your first meal following the procedure should be a light meal and then it is ok to progress to your normal diet. A half-sandwich or bowl of soup is an example of a good first meal. Heavy or fried foods are harder to digest and may make you feel nauseous or bloated. Drink plenty of fluids but you should avoid alcoholic beverages for 24 hours. If you had a esophageal dilation, please see attached instructions for diet.   ° °ACTIVITY: Your care partner should take you home directly after the procedure. You should plan to take it easy, moving slowly for the rest of the day. You can resume normal activity the day after the procedure however YOU SHOULD NOT DRIVE, use power tools, machinery or perform tasks that involve climbing or major physical exertion for 24 hours (because of the sedation medicines used during the test).  ° °SYMPTOMS TO REPORT IMMEDIATELY: °A gastroenterologist can be reached at any hour. Please call [phone]  for any of the following symptoms:  °Following lower endoscopy (colonoscopy, flexible sigmoidoscopy) °Excessive amounts of blood in the stool  °Significant tenderness, worsening of abdominal pains  °Swelling of the abdomen that is new, acute  °Fever of 100° or  higher  °Following upper endoscopy (EGD, EUS, ERCP, esophageal dilation) °Vomiting of blood or coffee ground material  °New, significant abdominal pain  °New, significant chest pain or pain under the shoulder blades  °Painful or persistently difficult swallowing  °New shortness of breath  °Black, tarry-looking or red, bloody stools ° °FOLLOW UP:  °If any biopsies were taken you will be contacted by phone or by letter within the next 1-3 weeks. Call [phone]  if you have not heard about the biopsies in 3 weeks.  °Please also call with any specific questions about appointments or follow up tests. ° °

## 2017-09-03 NOTE — Anesthesia Preprocedure Evaluation (Signed)
Anesthesia Evaluation  Patient identified by MRN, date of birth, ID band Patient awake    Reviewed: Allergy & Precautions, H&P , NPO status , Patient's Chart, lab work & pertinent test results  Airway Mallampati: II  TM Distance: <3 FB Neck ROM: Full    Dental  (+) Missing, Poor Dentition   Pulmonary neg pulmonary ROS,    Pulmonary exam normal breath sounds clear to auscultation       Cardiovascular hypertension, Pt. on medications Normal cardiovascular exam Rhythm:Regular Rate:Normal     Neuro/Psych Quadriplegic spinal paralysis  Cerebral palsy negative neurological ROS  negative psych ROS   GI/Hepatic Neg liver ROS, GERD  Medicated,(+) neg Cirrhosis      ,   Endo/Other  negative endocrine ROSdiabetes, Type 2, Oral Hypoglycemic Agents  Renal/GU negative Renal ROS  negative genitourinary   Musculoskeletal negative musculoskeletal ROS (+)   Abdominal Normal abdominal exam  (+)   Peds negative pediatric ROS (+)  Hematology negative hematology ROS (+)   Anesthesia Other Findings   Reproductive/Obstetrics negative OB ROS                             Anesthesia Physical  Anesthesia Plan  ASA: III  Anesthesia Plan: MAC   Post-op Pain Management:    Induction: Intravenous  PONV Risk Score and Plan:   Airway Management Planned: Natural Airway and Simple Face Mask  Additional Equipment:   Intra-op Plan:   Post-operative Plan:   Informed Consent: I have reviewed the patients History and Physical, chart, labs and discussed the procedure including the risks, benefits and alternatives for the proposed anesthesia with the patient or authorized representative who has indicated his/her understanding and acceptance.   Dental advisory given  Plan Discussed with: CRNA  Anesthesia Plan Comments:         Anesthesia Quick Evaluation

## 2017-09-03 NOTE — Anesthesia Postprocedure Evaluation (Signed)
Anesthesia Post Note  Patient: Thomas Ramos  Procedure(s) Performed: ESOPHAGOGASTRODUODENOSCOPY (EGD) WITH PROPOFOL (N/A )     Patient location during evaluation: Endoscopy Anesthesia Type: MAC Level of consciousness: awake Pain management: pain level controlled Vital Signs Assessment: post-procedure vital signs reviewed and stable Respiratory status: spontaneous breathing Cardiovascular status: stable Postop Assessment: no apparent nausea or vomiting Anesthetic complications: no    Last Vitals:  Vitals:   09/03/17 1145 09/03/17 1150  BP: (!) 141/113 117/62  Pulse: 83 89  Resp: (!) 22 16  Temp:    SpO2: 100% 100%    Last Pain:  Vitals:   09/03/17 1115  TempSrc: Oral  PainSc: 0-No pain   Pain Goal:                 Deepak Bless JR,JOHN Kinslei Labine

## 2017-09-03 NOTE — Telephone Encounter (Signed)
Dr. Prudence Davidson got a inbox message about patient Thomas Ramos from a Dr in [place], [place] about being diagnosed with Cellulitis and advised me to bring patient in. I called the patient and asked if he could come in sooner he didn't not want to come in sooner he wanted to keep appointment the way it is and stated if it got worse he would go to the ER. I advised him that we needed to get him in as soon as possible and I can help with that and he denied it and wanted to keep original appointment.

## 2017-09-03 NOTE — Interval H&P Note (Signed)
History and Physical Interval Note:  09/03/2017 10:47 AM  Thomas Ramos  has presented today for surgery, with the diagnosis of Dysphagia  The various methods of treatment have been discussed with the patient and family. After consideration of risks, benefits and other options for treatment, the patient has consented to  Procedure(s): ESOPHAGOGASTRODUODENOSCOPY (EGD) WITH PROPOFOL (N/A) BOTOX INJECTION (N/A) as a surgical intervention .  The patient's history has been reviewed, patient examined, no change in status, stable for surgery.  I have reviewed the patient's chart and labs.  Questions were answered to the patient's satisfaction.     [name]

## 2017-09-04 LAB — CULTURE, BLOOD (ROUTINE X 2)
Culture: NO GROWTH
Culture: NO GROWTH
SPECIAL REQUESTS: ADEQUATE
Special Requests: ADEQUATE

## 2017-09-06 ENCOUNTER — Encounter (HOSPITAL_COMMUNITY): Payer: Self-pay | Admitting: Gastroenterology

## 2017-09-08 ENCOUNTER — Ambulatory Visit (INDEPENDENT_AMBULATORY_CARE_PROVIDER_SITE_OTHER): Payer: Medicare Other | Admitting: Podiatry

## 2017-09-08 ENCOUNTER — Other Ambulatory Visit: Payer: Self-pay

## 2017-09-08 DIAGNOSIS — M79676 Pain in unspecified toe(s): Secondary | ICD-10-CM | POA: Diagnosis not present

## 2017-09-08 DIAGNOSIS — B351 Tinea unguium: Secondary | ICD-10-CM | POA: Diagnosis not present

## 2017-09-08 DIAGNOSIS — E0843 Diabetes mellitus due to underlying condition with diabetic autonomic (poly)neuropathy: Secondary | ICD-10-CM | POA: Diagnosis not present

## 2017-09-12 NOTE — Progress Notes (Signed)
   SUBJECTIVE Patient with a history of diabetes mellitus presents to office today complaining of elongated, thickened nails that cause pain while ambulating in shoes. He is unable to trim his own nails. Patient is here for further evaluation and treatment.   Past Medical History:  Diagnosis Date  . Anxiety   . Cellulitis 08/04/2017  . Cerebral palsy ([place])   . Chronic diastolic (congestive) heart failure ([place])   . CP (cerebral palsy) ([place])   . Diabetes mellitus without complication (HCC)    borderline  . Environmental allergies    takes inhalers if needed  . Esophageal stricture   . GERD (gastroesophageal reflux disease)   . Hypertension   . Motility disorder, esophageal   . Pneumonia   . 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[place])   . S/P Botox injection    approx every 4 months  . Seasonal allergies     OBJECTIVE General Patient is awake, alert, and oriented x 3 and in no acute distress. Derm Skin is dry and supple bilateral. Negative open lesions or macerations. Remaining integument unremarkable. Nails are tender, long, thickened and dystrophic with subungual debris, consistent with onychomycosis, 1-5 bilateral. No signs of infection noted. Vasc  DP and PT pedal pulses palpable bilaterally. Temperature gradient within normal limits.  Neuro Epicritic and protective threshold sensation diminished bilaterally.  Musculoskeletal Exam No symptomatic pedal deformities noted bilateral. Muscular strength within normal limits.  ASSESSMENT 1. Diabetes Mellitus w/ peripheral neuropathy 2. Onychomycosis of nail due to dermatophyte bilateral 3. Pain in foot bilateral  PLAN OF CARE 1. Patient evaluated today. 2. Instructed to maintain good pedal hygiene and foot care. Stressed importance of controlling blood sugar.  3. Mechanical debridement of nails 1-5 bilaterally performed using a nail nipper. Filed with dremel without incident.  4. Return to clinic in 3 mos.     Edrick Kins,  DPM [facility]  Dr. Edrick Kins, [address]                                        [place], [place] [address]                Office [phone]  Fax [phone]

## 2017-09-16 ENCOUNTER — Emergency Department (HOSPITAL_COMMUNITY): Payer: Medicare Other

## 2017-09-16 ENCOUNTER — Other Ambulatory Visit: Payer: Self-pay

## 2017-09-16 ENCOUNTER — Emergency Department (HOSPITAL_COMMUNITY)
Admission: EM | Admit: 2017-09-16 | Discharge: 2017-09-17 | Disposition: A | Discharge status: Home / Self Care | Payer: Medicare Other | Attending: Emergency Medicine | Admitting: Emergency Medicine

## 2017-09-16 DIAGNOSIS — Z87891 Personal history of nicotine dependence: Secondary | ICD-10-CM | POA: Diagnosis not present

## 2017-09-16 DIAGNOSIS — J40 Bronchitis, not specified as acute or chronic: Secondary | ICD-10-CM | POA: Insufficient documentation

## 2017-09-16 DIAGNOSIS — Z7984 Long term (current) use of oral hypoglycemic drugs: Secondary | ICD-10-CM | POA: Diagnosis not present

## 2017-09-16 DIAGNOSIS — I5032 Chronic diastolic (congestive) heart failure: Secondary | ICD-10-CM | POA: Diagnosis not present

## 2017-09-16 DIAGNOSIS — R600 Localized edema: Secondary | ICD-10-CM

## 2017-09-16 DIAGNOSIS — Z79899 Other long term (current) drug therapy: Secondary | ICD-10-CM | POA: Insufficient documentation

## 2017-09-16 DIAGNOSIS — R2241 Localized swelling, mass and lump, right lower limb: Secondary | ICD-10-CM | POA: Diagnosis not present

## 2017-09-16 DIAGNOSIS — E119 Type 2 diabetes mellitus without complications: Secondary | ICD-10-CM | POA: Insufficient documentation

## 2017-09-16 DIAGNOSIS — I110 Hypertensive heart disease with heart failure: Secondary | ICD-10-CM | POA: Insufficient documentation

## 2017-09-16 DIAGNOSIS — G800 Spastic quadriplegic cerebral palsy: Secondary | ICD-10-CM | POA: Insufficient documentation

## 2017-09-16 LAB — BASIC METABOLIC PANEL
Anion gap: 11 (ref 5–15)
BUN: 11 mg/dL (ref 6–20)
CALCIUM: 9.2 mg/dL (ref 8.9–10.3)
CO2: 25 mmol/L (ref 22–32)
Chloride: 101 mmol/L (ref 98–111)
Creatinine, Ser: 0.58 mg/dL — ABNORMAL LOW (ref 0.61–1.24)
Glucose, Bld: 159 mg/dL — ABNORMAL HIGH (ref 70–99)
Potassium: 3.4 mmol/L — ABNORMAL LOW (ref 3.5–5.1)
SODIUM: 137 mmol/L (ref 135–145)

## 2017-09-16 LAB — CBC WITH DIFFERENTIAL/PLATELET
Abs Immature Granulocytes: 0 10*3/uL (ref 0.0–0.1)
BASOS PCT: 1 %
Basophils Absolute: 0 10*3/uL (ref 0.0–0.1)
EOS ABS: 0.1 10*3/uL (ref 0.0–0.7)
Eosinophils Relative: 2 %
HCT: 36.5 % — ABNORMAL LOW (ref 39.0–52.0)
Hemoglobin: 11.6 g/dL — ABNORMAL LOW (ref 13.0–17.0)
Immature Granulocytes: 0 %
Lymphocytes Relative: 31 %
Lymphs Abs: 1.6 10*3/uL (ref 0.7–4.0)
MCH: 25.6 pg — ABNORMAL LOW (ref 26.0–34.0)
MCHC: 31.8 g/dL (ref 30.0–36.0)
MCV: 80.6 fL (ref 78.0–100.0)
Monocytes Absolute: 0.4 10*3/uL (ref 0.1–1.0)
Monocytes Relative: 9 %
NEUTROS PCT: 57 %
Neutro Abs: 3 10*3/uL (ref 1.7–7.7)
PLATELETS: 222 10*3/uL (ref 150–400)
RBC: 4.53 MIL/uL (ref 4.22–5.81)
RDW: 12.8 % (ref 11.5–15.5)
WBC: 5.2 10*3/uL (ref 4.0–10.5)

## 2017-09-16 LAB — BRAIN NATRIURETIC PEPTIDE: B Natriuretic Peptide: 19.5 pg/mL (ref 0.0–100.0)

## 2017-09-16 LAB — I-STAT TROPONIN, ED: TROPONIN I, POC: 0 ng/mL (ref 0.00–0.08)

## 2017-09-16 NOTE — ED Triage Notes (Signed)
Pt arrives from home for flare up of cellulitis x2 years, pt has drs appointment on Monday but felt like he should come in tonight  Pt A0x4. Pain 9/10, hx cerebral palsy  [facility] vitals: 132/82 (97/56 on arrival to ED) HR 94, 96% on RA; 176 CBG without diagnosis of diabetes

## 2017-09-16 NOTE — ED Provider Notes (Signed)
[facility] EMERGENCY DEPARTMENT Provider Note   CSN: [id number] Arrival date & time: 09/16/17  2140     History   Chief Complaint Chief Complaint  Patient presents with  . Leg Swelling    HPI Thomas Ramos is a 47 y.o. male.  Patient with past medical history remarkable for cerebral palsy, diabetes, CHF, hypertension, prior cellulitis, presents to the emergency department with a chief complaint of right-sided leg swelling.  He states that he noticed the symptoms this morning.  He denies having any fever chills, nausea, vomiting.  He states that he has had a slight cough.  He denies feeling short of breath or having any chest pain.  He states that he does take Lasix, and has been taking all of his prescribed medications as directed.  He states that he has an appointment with his PCP on Monday, but states that due to the leg swelling he wanted to be evaluated tonight.  The history is provided by the patient. No language interpreter was used.    Past Medical History:  Diagnosis Date  . Anxiety   . Cellulitis 08/04/2017  . Cerebral palsy ([place])   . Chronic diastolic (congestive) heart failure ([place])   . CP (cerebral palsy) ([place])   . Diabetes mellitus without complication (HCC)    borderline  . Environmental allergies    takes inhalers if needed  . Esophageal stricture   . GERD (gastroesophageal reflux disease)   . Hypertension   . Motility disorder, esophageal   . Pneumonia   . 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[place])   . S/P Botox injection    approx every 4 months  . Seasonal allergies     Patient Active Problem List   Diagnosis Date Noted  . Hiatal hernia   . Cellulitis of right lower extremity without foot 08/03/2017  . Type 2 diabetes mellitus with complication, without long-term current use of insulin ([place]) 08/03/2017  . PNA (pneumonia) 05/07/2017  . SIRS (systemic inflammatory response syndrome) (HCC)   . Acute respiratory failure with hypoxia ([place])  03/13/2017  . 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[place])   . Rhinovirus infection 08/17/2016  . Pressure injury of skin 08/16/2016  . Acute respiratory distress 08/15/2016  . Tachycardia 08/15/2016  . Sepsis ([place]) 08/15/2016  . Onychomycosis 02/20/2016  . Essential hypertension   . OSA (obstructive sleep apnea) 12/11/2015  . Chronic diastolic (congestive) heart failure ([place])   . Chronic venous insufficiency   . Depression with anxiety 09/20/2015  . CAP (community acquired pneumonia) 09/20/2015  . Hypersomnia 07/16/2015  . Asthma exacerbation 07/16/2015  . GERD (gastroesophageal reflux disease) 07/16/2015  . Hematemesis with nausea   . Fever 05/24/2014  . Dysphagia 09/16/2010  . 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 BOTOX INJECTION N/A 12/21/2012   Procedure: BOTOX INJECTION;  Surgeon: Inda Castle, MD;  Location: [facility];  Service: Endoscopy;  Laterality: N/A;  . BOTOX INJECTION N/A 06/28/2014   Procedure: BOTOX INJECTION;  Surgeon: Inda Castle, MD;  Location: [facility];  Service: Endoscopy;  Laterality: N/A;  . ESOPHAGOGASTRODUODENOSCOPY N/A 12/21/2012   Procedure: ESOPHAGOGASTRODUODENOSCOPY (EGD);  Surgeon: Inda Castle, MD;  Location: [facility];  Service: Endoscopy;  Laterality: N/A;  . ESOPHAGOGASTRODUODENOSCOPY N/A 06/28/2014   Procedure: ESOPHAGOGASTRODUODENOSCOPY (EGD);  Surgeon: Inda Castle, MD;  Location: [facility];  Service: Endoscopy;  Laterality: N/A;  . ESOPHAGOGASTRODUODENOSCOPY (EGD) WITH PROPOFOL N/A 09/03/2017   Procedure: ESOPHAGOGASTRODUODENOSCOPY (EGD)  WITH PROPOFOL;  Surgeon: Doran Stabler, MD;  Location: [facility];  Service: Gastroenterology;  Laterality: N/A;  . ESOPHAGUS SURGERY     stretched esophagus  . legs    . MOUTH SURGERY    . TENDON RELEASE          Home Medications    Prior to Admission medications   Medication Sig Start Date End Date Taking?  Authorizing Provider  budesonide-formoterol (SYMBICORT) 80-4.5 MCG/ACT inhaler Inhale 2 puffs into the lungs 2 (two) times daily.    [provider]  [facility] CLOTRIMAZOLE 1 % cream Apply 1 application topically 2 (two) times daily. BETWEEN TOES 08/19/17   [provider]  furosemide (LASIX) 20 MG tablet Take 20 mg by mouth daily. 07/15/17   [provider]  latanoprost (XALATAN) 0.005 % ophthalmic solution Place 1 drop into both eyes daily as needed (dry eyes).     [provider]  metFORMIN (GLUCOPHAGE) 500 MG tablet Take 500-1,000 mg by mouth daily.     [provider]  montelukast (SINGULAIR) 10 MG tablet Take 10 mg by mouth daily.  08/12/16   [provider]  mupirocin ointment (BACTROBAN) 2 % Apply to affected as needed 04/30/14   [provider]  naproxen sodium (ALEVE) 220 MG tablet Take 220-440 mg by mouth 2 (two) times daily as needed (for pain).    [provider]  nitroGLYCERIN (NITROSTAT) 0.4 MG SL tablet Place under the tongue.    [provider]  ondansetron (ZOFRAN) 4 MG tablet Take 1 tablet (4 mg total) by mouth every 6 (six) hours. Patient taking differently: Take 4 mg by mouth every 6 (six) hours as needed for nausea or vomiting.  02/05/17   Orpah Greek, MD  potassium chloride (K-DUR) 10 MEQ tablet Take 10 mEq by mouth daily.    [provider]  rosuvastatin (CRESTOR) 5 MG tablet Take 5 mg by mouth at bedtime.    [provider]    Family History Family History  Problem Relation Age of Onset  . Hypertension Father   . Lung cancer Father   . Diabetes Mother     Social History Social History   Tobacco Use  . Smoking status: Never Smoker  . Smokeless tobacco: Former Systems developer  . Tobacco comment: smoked one time  Substance Use Topics  . Alcohol use: Yes    Comment: 2 beers every other Friday and Saturday  . Drug use: No     Allergies   Penicillins;  Sulfamethoxazole-trimethoprim; and Metronidazole   Review of Systems Review of Systems  All other systems reviewed and are negative.    Physical Exam Updated Vital Signs BP (!) 97/56 (BP Location: Left Arm)   Pulse 91   Temp 98.4 F (36.9 C) (Oral)   Resp 16   Ht 5\' 5"  (1.651 m)   Wt 73.9 kg (163 lb)   SpO2 96%   BMI 27.12 kg/m   Physical Exam  Constitutional: He is oriented to person, place, and time. He appears well-developed and well-nourished.  HENT:  Head: Normocephalic and atraumatic.  Eyes: Pupils are equal, round, and reactive to light. Conjunctivae and EOM are normal. Right eye exhibits no discharge. Left eye exhibits no discharge. No scleral icterus.  Neck: Normal range of motion. Neck supple. No JVD present.  Cardiovascular: Normal rate, regular rhythm and normal heart sounds. Exam reveals no gallop and no friction rub.  No murmur heard. Pulmonary/Chest: No respiratory distress. He has  no wheezes. He has no rales. He exhibits no tenderness.  Diminished lung sounds  Abdominal: Soft. He exhibits no distension and no mass. There is no tenderness. There is no rebound and no guarding.  Musculoskeletal: Normal range of motion. He exhibits no edema or tenderness.  Extremities in contracture 2+ pitting edema of right lower extremity, 1+ pitting edema of left lower extremity  Neurological: He is alert and oriented to person, place, and time.  Skin: Skin is warm and dry.  No evidence of cellulitis in his bilateral lower extremities  Psychiatric: He has a normal mood and affect. His behavior is normal. Judgment and thought content normal.  Nursing note and vitals reviewed.    ED Treatments / Results  Labs (all labs ordered are listed, but only abnormal results are displayed) Labs Reviewed - No data to display  EKG None  Radiology No results found.  Procedures Procedures (including critical care time)  Medications Ordered in ED Medications - No data to  display   Initial Impression / Assessment and Plan / ED Course  I have reviewed the triage vital signs and the nursing notes.  Pertinent labs & imaging results that were available during my care of the patient were reviewed by me and considered in my medical decision making (see chart for details).     Patient with cerebral palsy, history of CHF, comes in with right-sided lower extremity swelling.  I see no evidence of infection, but he does have some pitting edema.  He also reports cough, but denies any shortness of breath or chest pain.  Will check labs, chest x-ray, EKG, and will reassess.  Patient seen by discussed with Dr. Nicholes Stairs, who recommends CT PE study given bilateral lower extremity swelling and bedbound patient.  Laboratory work-up is reassuring.  Troponin is negative.  No leukocytosis.  Electrolytes are unremarkable.  BNP is not elevated.  CT PE study shows bronchitis.  Will treat with azithromycin.  I discussed with the patient and his laboratory work-up is reassuring, and that if he were feeling too badly, I could consult the hospitalist for evaluation.  Patient states that he thinks he will do fine if he goes home.  He has close follow-up with his PCP.  He asks if I can write him out for work tomorrow.  Final Clinical Impressions(s) / ED Diagnoses   Final diagnoses:  Leg edema  Bronchitis    ED Discharge Orders        Ordered    azithromycin (ZITHROMAX) 250 MG tablet  Daily     09/17/17 0213       Montine Circle, PA-C 09/17/17 0215    Little, Wenda Overland, MD 09/19/17 1725

## 2017-09-17 ENCOUNTER — Emergency Department (HOSPITAL_COMMUNITY): Payer: Medicare Other

## 2017-09-17 DIAGNOSIS — R2241 Localized swelling, mass and lump, right lower limb: Secondary | ICD-10-CM | POA: Diagnosis not present

## 2017-09-17 MED ORDER — AZITHROMYCIN 250 MG PO TABS
500.0000 mg | ORAL_TABLET | Freq: Once | ORAL | Status: AC
  Administered 2017-09-17: 500 mg via ORAL
  Filled 2017-09-17: qty 2

## 2017-09-17 MED ORDER — IOPAMIDOL (ISOVUE-370) INJECTION 76%
100.0000 mL | Freq: Once | INTRAVENOUS | Status: AC | PRN
  Administered 2017-09-17: 100 mL via INTRAVENOUS

## 2017-09-17 MED ORDER — AZITHROMYCIN 250 MG PO TABS: 250.0000 mg | ORAL_TABLET | Freq: Every day | ORAL | 0 refills | Status: DC

## 2017-09-17 MED ORDER — IOPAMIDOL (ISOVUE-370) INJECTION 76%
INTRAVENOUS | Status: AC
  Filled 2017-09-17: qty 100

## 2017-09-17 NOTE — ED Notes (Signed)
[facility] called  

## 2017-09-22 ENCOUNTER — Emergency Department (HOSPITAL_COMMUNITY)
Admission: EM | Admit: 2017-09-22 | Discharge: 2017-09-23 | Disposition: A | Discharge status: Home / Self Care | Payer: Medicare Other | Attending: Emergency Medicine | Admitting: Emergency Medicine

## 2017-09-22 ENCOUNTER — Encounter (HOSPITAL_COMMUNITY): Payer: Self-pay | Admitting: *Deleted

## 2017-09-22 DIAGNOSIS — R21 Rash and other nonspecific skin eruption: Secondary | ICD-10-CM | POA: Diagnosis not present

## 2017-09-22 DIAGNOSIS — G800 Spastic quadriplegic cerebral palsy: Secondary | ICD-10-CM | POA: Diagnosis not present

## 2017-09-22 DIAGNOSIS — R600 Localized edema: Secondary | ICD-10-CM | POA: Diagnosis present

## 2017-09-22 DIAGNOSIS — I110 Hypertensive heart disease with heart failure: Secondary | ICD-10-CM | POA: Diagnosis not present

## 2017-09-22 DIAGNOSIS — Z79899 Other long term (current) drug therapy: Secondary | ICD-10-CM | POA: Diagnosis not present

## 2017-09-22 DIAGNOSIS — R2243 Localized swelling, mass and lump, lower limb, bilateral: Secondary | ICD-10-CM | POA: Diagnosis not present

## 2017-09-22 DIAGNOSIS — I5032 Chronic diastolic (congestive) heart failure: Secondary | ICD-10-CM | POA: Insufficient documentation

## 2017-09-22 DIAGNOSIS — Z7984 Long term (current) use of oral hypoglycemic drugs: Secondary | ICD-10-CM | POA: Insufficient documentation

## 2017-09-22 DIAGNOSIS — E119 Type 2 diabetes mellitus without complications: Secondary | ICD-10-CM | POA: Insufficient documentation

## 2017-09-22 LAB — SEDIMENTATION RATE: SED RATE: 16 mm/h (ref 0–16)

## 2017-09-22 LAB — COMPREHENSIVE METABOLIC PANEL
ALT: 15 U/L (ref 0–44)
ANION GAP: 10 (ref 5–15)
AST: 16 U/L (ref 15–41)
Albumin: 3.8 g/dL (ref 3.5–5.0)
Alkaline Phosphatase: 109 U/L (ref 38–126)
BUN: 16 mg/dL (ref 6–20)
CHLORIDE: 104 mmol/L (ref 98–111)
CO2: 25 mmol/L (ref 22–32)
Calcium: 9.1 mg/dL (ref 8.9–10.3)
Creatinine, Ser: 0.87 mg/dL (ref 0.61–1.24)
GFR calc Af Amer: >60 mL/min (ref >60)
GFR calc non Af Amer: >60 mL/min (ref >60)
Glucose, Bld: 121 mg/dL — ABNORMAL HIGH (ref 70–99)
POTASSIUM: 4 mmol/L (ref 3.5–5.1)
SODIUM: 139 mmol/L (ref 135–145)
Total Bilirubin: 0.5 mg/dL (ref 0.3–1.2)
Total Protein: 6.4 g/dL — ABNORMAL LOW (ref 6.5–8.1)

## 2017-09-22 LAB — CBC WITH DIFFERENTIAL/PLATELET
BASOS ABS: 0.1 10*3/uL (ref 0.0–0.1)
Basophils Relative: 1 %
EOS PCT: 2 %
Eosinophils Absolute: 0.1 10*3/uL (ref 0.0–0.7)
HCT: 37.8 % — ABNORMAL LOW (ref 39.0–52.0)
Hemoglobin: 11.8 g/dL — ABNORMAL LOW (ref 13.0–17.0)
LYMPHS PCT: 31 %
Lymphs Abs: 2 10*3/uL (ref 0.7–4.0)
MCH: 25.5 pg — AB (ref 26.0–34.0)
MCHC: 31.2 g/dL (ref 30.0–36.0)
MCV: 81.8 fL (ref 78.0–100.0)
MONOS PCT: 8 %
Monocytes Absolute: 0.5 10*3/uL (ref 0.1–1.0)
NEUTROS ABS: 3.9 10*3/uL (ref 1.7–7.7)
Neutrophils Relative %: 58 %
PLATELETS: 228 10*3/uL (ref 150–400)
RBC: 4.62 MIL/uL (ref 4.22–5.81)
RDW: 13.1 % (ref 11.5–15.5)
WBC: 6.6 10*3/uL (ref 4.0–10.5)

## 2017-09-22 LAB — C-REACTIVE PROTEIN: CRP: 1.1 mg/dL — ABNORMAL HIGH (ref <1.0)

## 2017-09-22 NOTE — ED Triage Notes (Signed)
The pt arrived  By [facility] from home  He has  Been being treated for cellulitis of both lower legs since Monday.  He saw his doctor on Monday and was given antibiotics  The pt and family htinks that the redness has increased  Since Monday.  Iv per ems

## 2017-09-22 NOTE — ED Provider Notes (Signed)
[facility] EMERGENCY DEPARTMENT Provider Note   CSN: [id number] Arrival date & time:        History   Chief Complaint Chief Complaint  Patient presents with  . Cellulitis    HPI IZIK BINGMAN is a 47 y.o. male.  JONY LADNIER is a 47 y.o. Male with a history of cerebral palsy with quadriplegic spinal paralysis, hypertension, diabetes, CHF, who presents to the emergency department for evaluation of swelling and redness of bilateral lower legs.  Patient was seen for similar symptoms on 7/4, and has since followed up with his primary care doctor on 7/10.  Patient reports his primary care doctor diagnosed him with cellulitis of both lower legs and put him on antibiotics, but he feels the redness has gotten worse today.  Per chart review patient was prescribed doxycycline by his primary care doctor.  Patient reports he has been dealing with leg swelling bilaterally for a long time, this is intermittently painful.  Patient was noted to have similar bilateral lower extremity swelling during ED visit on 7/4, where he had a reassuring labs and a negative CT PE study.  Patient denies any chest pain or shortness of breath today, no fevers or chills.  Patient reports repeatedly that he just wants someone to help him with this leg swelling.  He reports the redness has been there, but he feels the area got larger and he noticed some dots within the red area, he has not noticed any drainage.  When he is in his wheelchair his legs do not press up against anything to create a pressure sore.     Past Medical History:  Diagnosis Date  . Anxiety   . Cellulitis 08/04/2017  . Cerebral palsy ([place])   . Chronic diastolic (congestive) heart failure ([place])   . CP (cerebral palsy) ([place])   . Diabetes mellitus without complication (HCC)    borderline  . Environmental allergies    takes inhalers if needed  . Esophageal stricture   . GERD (gastroesophageal reflux disease)   . Hypertension     . Motility disorder, esophageal   . Pneumonia   . 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[place])   . S/P Botox injection    approx every 4 months  . Seasonal allergies     Patient Active Problem List   Diagnosis Date Noted  . Hiatal hernia   . Cellulitis of right lower extremity without foot 08/03/2017  . Type 2 diabetes mellitus with complication, without long-term current use of insulin ([place]) 08/03/2017  . PNA (pneumonia) 05/07/2017  . SIRS (systemic inflammatory response syndrome) (HCC)   . Acute respiratory failure with hypoxia ([place]) 03/13/2017  . 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[place])   . Rhinovirus infection 08/17/2016  . Pressure injury of skin 08/16/2016  . Acute respiratory distress 08/15/2016  . Tachycardia 08/15/2016  . Sepsis ([place]) 08/15/2016  . Onychomycosis 02/20/2016  . Essential hypertension   . OSA (obstructive sleep apnea) 12/11/2015  . Chronic diastolic (congestive) heart failure ([place])   . Chronic venous insufficiency   . Depression with anxiety 09/20/2015  . CAP (community acquired pneumonia) 09/20/2015  . Hypersomnia 07/16/2015  . Asthma exacerbation 07/16/2015  . GERD (gastroesophageal reflux disease) 07/16/2015  . Hematemesis with nausea   . Fever 05/24/2014  . Dysphagia 09/16/2010  . 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 BOTOX INJECTION N/A 12/21/2012   Procedure: BOTOX INJECTION;  Surgeon: Inda Castle, MD;  Location: [facility];  Service: Endoscopy;  Laterality: N/A;  . BOTOX INJECTION N/A 06/28/2014   Procedure: BOTOX INJECTION;  Surgeon: Inda Castle, MD;  Location: [facility];  Service: Endoscopy;  Laterality: N/A;  . ESOPHAGOGASTRODUODENOSCOPY N/A 12/21/2012   Procedure: ESOPHAGOGASTRODUODENOSCOPY (EGD);  Surgeon: Inda Castle, MD;  Location: [facility];  Service: Endoscopy;  Laterality: N/A;  . ESOPHAGOGASTRODUODENOSCOPY N/A 06/28/2014   Procedure: ESOPHAGOGASTRODUODENOSCOPY  (EGD);  Surgeon: Inda Castle, MD;  Location: [facility];  Service: Endoscopy;  Laterality: N/A;  . ESOPHAGOGASTRODUODENOSCOPY (EGD) WITH PROPOFOL N/A 09/03/2017   Procedure: ESOPHAGOGASTRODUODENOSCOPY (EGD) WITH PROPOFOL;  Surgeon: Doran Stabler, MD;  [facility];  Service: Gastroenterology;  Laterality: N/A;  . ESOPHAGUS SURGERY     stretched esophagus  . legs    . MOUTH SURGERY    . TENDON RELEASE          Home Medications    Prior to Admission medications   Medication Sig Start Date End Date Taking? Authorizing Provider  azithromycin (ZITHROMAX) 250 MG tablet Take 1 tablet (250 mg total) by mouth daily. Take 1 tablet every day starting on Saturday until finished. 09/17/17  Yes Montine Circle, PA-C  budesonide-formoterol (SYMBICORT) 80-4.5 MCG/ACT inhaler Inhale 2 puffs into the lungs 2 (two) times daily.   Yes [provider]  [facility] CLOTRIMAZOLE 1 % cream Apply 1 application topically 2 (two) times daily as needed (rash). BETWEEN TOES 08/19/17  Yes [provider]  furosemide (LASIX) 20 MG tablet Take 20 mg by mouth daily. 07/15/17  Yes [provider]  latanoprost (XALATAN) 0.005 % ophthalmic solution Place 1 drop into both eyes daily as needed (dry eyes).    Yes [provider]  metFORMIN (GLUCOPHAGE) 500 MG tablet Take 1,000 mg by mouth at bedtime.    Yes [provider]  montelukast (SINGULAIR) 10 MG tablet Take 10 mg by mouth daily.  08/12/16  Yes [provider]  mupirocin ointment (BACTROBAN) 2 % Apply to affected as needed for rash 04/30/14  Yes [provider]  naproxen sodium (ALEVE) 220 MG tablet Take 220-440 mg by mouth 2 (two) times daily as needed (for pain).   Yes [provider]  nitroGLYCERIN (NITROSTAT) 0.4 MG SL tablet Place 0.4 mg under the tongue every 5 (five) minutes as needed for chest pain.    Yes [provider]  ondansetron (ZOFRAN) 4 MG tablet Take 1 tablet (4 mg  total) by mouth every 6 (six) hours. Patient taking differently: Take 4 mg by mouth every 6 (six) hours as needed for nausea or vomiting.  02/05/17  Yes Pollina, Gwenyth Allegra, MD  potassium chloride (K-DUR) 10 MEQ tablet Take 10 mEq by mouth daily.   Yes [provider]  rosuvastatin (CRESTOR) 5 MG tablet Take 5 mg by mouth at bedtime.   Yes [provider]    Family History Family History  Problem Relation Age of Onset  . Hypertension Father   . Lung cancer Father   . Diabetes Mother     Social History Social History   Tobacco Use  . Smoking status: Never Smoker  . Smokeless tobacco: Former Systems developer  . Tobacco comment: smoked one time  Substance Use Topics  . Alcohol use: Yes    Comment: 2 beers every other Friday and Saturday  . Drug use: No     Allergies   Penicillins; Sulfamethoxazole-trimethoprim; and Metronidazole   Review of Systems Review of Systems  Constitutional: Negative for chills  and fever.  HENT: Negative.   Eyes: Negative for visual disturbance.  Respiratory: Negative for cough and shortness of breath.   Cardiovascular: Positive for leg swelling. Negative for chest pain.  Gastrointestinal: Negative for abdominal pain, nausea and vomiting.  Genitourinary: Negative for dysuria and frequency.  Musculoskeletal: Positive for myalgias. Negative for arthralgias.  Skin: Positive for color change. Negative for wound.  Neurological: Negative for dizziness, syncope and light-headedness.     Physical Exam Updated Vital Signs BP 112/73   Pulse 92   Temp 98.1 F (36.7 C) (Oral)   Resp 18   Ht _0  (1.651 m)   Wt 73.9 kg (163 lb)   SpO2 99%   BMI 27.12 kg/m   Physical Exam  Constitutional: He appears well-developed and well-nourished. No distress.  HENT:  Head: Normocephalic and atraumatic.  Eyes: Right eye exhibits no discharge. Left eye exhibits no discharge.  Neck: Neck supple.  Cardiovascular: Normal rate, regular rhythm, normal  heart sounds and intact distal pulses.  Pulmonary/Chest: Effort normal and breath sounds normal. No respiratory distress.  Respirations equal and unlabored, patient able to speak in full sentences, lungs clear to auscultation bilaterally  Abdominal: Soft. Bowel sounds are normal. He exhibits no distension. There is no tenderness.  Abdomen soft, nondistended, nontender to palpation in all quadrants without guarding or peritoneal signs  Musculoskeletal:  Bilateral lower extremities with 2+ edema of the foot and ankle and shin, there is an erythematous area over both shins, this is not indurated or warm to the touch, and there appears to be some petechiae present within this area, especially on the left, there are also some petechiae noted over the left toes.  (Please see photo below)  Neurological: He is alert. Coordination normal.  Skin: Skin is warm and dry. He is not diaphoretic.  Psychiatric: He has a normal mood and affect. His behavior is normal.  Nursing note and vitals reviewed.          ED Treatments / Results  Labs (all labs ordered are listed, but only abnormal results are displayed) Labs Reviewed  CBC WITH DIFFERENTIAL/PLATELET - Abnormal; Notable for the following components:      Result Value   Hemoglobin 11.8 (*)    HCT 37.8 (*)    MCH 25.5 (*)    All other components within normal limits  COMPREHENSIVE METABOLIC PANEL - Abnormal; Notable for the following components:   Glucose, Bld 121 (*)    Total Protein 6.4 (*)    All other components within normal limits  C-REACTIVE PROTEIN - Abnormal; Notable for the following components:   CRP 1.1 (*)    All other components within normal limits  SEDIMENTATION RATE    EKG None  Radiology No results found.  Procedures Procedures (including critical care time)  Medications Ordered in ED Medications  acetaminophen (TYLENOL) tablet 1,000 mg (1,000 mg Oral Given 09/23/17 0017)     Initial Impression / Assessment  and Plan / ED Course  I have reviewed the triage vital signs and the nursing notes.  Pertinent labs & imaging results that were available during my care of the patient were reviewed by me and considered in my medical decision making (see chart for details).  Patient presents for evaluation of bilateral lower extremity swelling and redness.  He is currently on doxycycline for treatment of cellulitis in this area from his PCP, and is concerned this is getting worse.  Patient denies fevers, no chest pain, shortness of breath or  abdominal pain, no nausea or vomiting.  Here in the ED patient initially tachycardic but this resolved without intervention, patient afebrile and vitals otherwise normal.  Had recent negative CT PE study.  Bilateral lower extremities with 2+ pitting edema the level of the foot ankle and shin.  There are areas of erythema over both shins with some small petechiae noted over the shins and left toes.  There is no induration or palpable warmth, and redness seems less suggestive of cellulitis on my exam.  Given potential petechiae will check basic labs as well as ESR and CRP.  Labs overall very reassuring, no leukocytosis, hemoglobin is at baseline, normal platelet count.  No acute electrolyte derangements requiring intervention, normal renal and liver function, CRP and ESR are unremarkable.  I feel patient is stable for discharge home given these reassuring labs.  I do think that erythematous rash over bilateral shins could be more related to stasis dermatitis, encourage patient to elevate legs as much as possible and use compression stockings.  He is to follow-up with his primary care doctor, I have also recommended dermatologic follow-up.  Return precautions discussed.  Patient expresses understanding and is in agreement with plan.  Final Clinical Impressions(s) / ED Diagnoses   Final diagnoses:  Rash  Edema of both legs    ED Discharge Orders    None       Janet Berlin 09/23/17 0210    Drenda Freeze, MD 09/23/17 1440

## 2017-09-22 NOTE — ED Notes (Signed)
Pt has redness just above the ankle.  Bi-lateral lower extremity redness

## 2017-09-22 NOTE — Discharge Instructions (Signed)
I think the rash on your legs is more likely stasis dermatitis from your chronic leg swelling rather than cellulitis especially since this is not improving with antibiotics.  Your labs today look reassuring.  Please try and elevate your legs and use compression stockings as much as possible and please follow-up with your primary doctor I also think it would be beneficial for you to see a dermatologist.  Return for worsening leg swelling, spreading redness, new or worsening rash, fever, chest pain, shortness of breath or any other new or concerning symptoms.

## 2017-09-23 MED ORDER — ACETAMINOPHEN 500 MG PO TABS
1000.0000 mg | ORAL_TABLET | Freq: Once | ORAL | Status: AC
  Administered 2017-09-23: 1000 mg via ORAL
  Filled 2017-09-23: qty 2

## 2017-09-23 NOTE — ED Notes (Signed)
[facility]AR contacted for tx back home:  [address] [place]

## 2017-09-29 ENCOUNTER — Ambulatory Visit: Payer: Medicare Other | Admitting: Podiatry

## 2017-10-07 ENCOUNTER — Ambulatory Visit: Payer: Medicare Other | Admitting: Physical Therapy

## 2017-10-07 ENCOUNTER — Ambulatory Visit: Payer: Medicare Other | Admitting: Occupational Therapy

## 2017-11-19 ENCOUNTER — Telehealth: Payer: Self-pay | Admitting: Gastroenterology

## 2017-11-22 NOTE — Telephone Encounter (Signed)
Spoke to patient, very difficult to understand over the phone, what I could tell is that patient is having a few more stools, no change other than increased frequency. He denies blood in stool, no pain. He is not having any difficulty swallowing. I have made him a follow up appointment, per patient he is requesting one out a few weeks.

## 2017-11-22 NOTE — Telephone Encounter (Signed)
Left voice message for patient that I was returning his call.

## 2017-11-25 ENCOUNTER — Ambulatory Visit: Payer: Medicare Other | Attending: Family Medicine | Admitting: Physical Therapy

## 2017-11-25 DIAGNOSIS — G800 Spastic quadriplegic cerebral palsy: Secondary | ICD-10-CM | POA: Diagnosis not present

## 2017-11-26 ENCOUNTER — Other Ambulatory Visit: Payer: Self-pay

## 2017-11-26 ENCOUNTER — Encounter: Payer: Self-pay | Admitting: Physical Therapy

## 2017-11-26 NOTE — Therapy (Signed)
[facility] [address] [place], [place], [address] Phone: [phone]   Fax:  [phone]  Physical Therapy Evaluation  Patient Details  Name: Thomas Ramos MRN: [id number] Date of Birth: 12/29/1970 Referring Provider: Dr. Anastasia Pall   Encounter Date: 11/25/2017  PT End of Session - 11/26/17 1112    Visit Number  1    Number of Visits  2    Date for PT Re-Evaluation  02/23/18    Authorization Type  11-25-17 - 02-23-18    PT Start Time  1450    PT Stop Time  1552    PT Time Calculation (min)  62 min       Past Medical History:  Diagnosis Date  . Anxiety   . Cellulitis 08/04/2017  . Cerebral palsy ([place])   . Chronic diastolic (congestive) heart failure ([place])   . CP (cerebral palsy) ([place])   . Diabetes mellitus without complication (HCC)    borderline  . Environmental allergies    takes inhalers if needed  . Esophageal stricture   . GERD (gastroesophageal reflux disease)   . Hypertension   . Motility disorder, esophageal   . Pneumonia   . 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[place])   . S/P Botox injection    approx every 4 months  . Seasonal allergies     Past Surgical History:  Procedure Laterality Date  . BOTOX INJECTION N/A 12/21/2012   Procedure: BOTOX INJECTION;  Surgeon: Inda Castle, MD;  [facility];  Service: Endoscopy;  Laterality: N/A;  . BOTOX INJECTION N/A 06/28/2014   Procedure: BOTOX INJECTION;  Surgeon: Inda Castle, MD;  Location: [facility];  Service: Endoscopy;  Laterality: N/A;  . ESOPHAGOGASTRODUODENOSCOPY N/A 12/21/2012   Procedure: ESOPHAGOGASTRODUODENOSCOPY (EGD);  Surgeon: Inda Castle, MD;  Location[facility];  Service: Endoscopy;  Laterality: N/A;  . ESOPHAGOGASTRODUODENOSCOPY N/A 06/28/2014   Procedure: ESOPHAGOGASTRODUODENOSCOPY (EGD);  Surgeon: Inda Castle, MD;  Location: [facility];  Service: Endoscopy;  Laterality: N/A;  . ESOPHAGOGASTRODUODENOSCOPY  (EGD) WITH PROPOFOL N/A 09/03/2017   Procedure: ESOPHAGOGASTRODUODENOSCOPY (EGD) WITH PROPOFOL;  Surgeon: Doran Stabler, MD;  [facility];  Service: Gastroenterology;  Laterality: N/A;  . ESOPHAGUS SURGERY     stretched esophagus  . legs    . MOUTH SURGERY    . TENDON RELEASE      There were no vitals filed for this visit.   Subjective Assessment - 11/26/17 1102    Subjective  Pt presents for wheelchair seating modications and with list of needed repairs for current wheelchair as current chair is only 45 yrs old, with pt ineligible for new powe wheelchair until Aug. 2020; pt accompanied to PT by group home manager, LuLu     Patient is accompained by:  --   resident Games developer, LuLu   Pertinent History  spastic CP    Patient Stated Goals  obtain new parts for current wheelchair as not yet eligible for new power wheelchair at this time due to current chair being only 45 yrs old    Currently in Pain?  Yes    Pain Score  4     Pain Location  Abdomen    Pain Orientation  Mid;Lower    Pain Descriptors / Indicators  Tender;Burning;Aching    Pain Type  Chronic pain    Pain Onset  More than a month ago    Aggravating Factors   pressure from seat belt on wheelchair contributing to  discomfort and skin breakdown area     Pain Relieving Factors  release of seat belt to relieve pressure on abdomen         [facility] PT Assessment - 11/26/17 0001      Assessment   Medical Diagnosis  Spastic CP with quadriplegia     Referring Provider  Dr. Anastasia Pall    Onset Date/Surgical Date  --   Congenital      Precautions   Precautions  Fall      Balance Screen   Has the patient fallen in the past 6 months  Yes    How many times?  1    Has the patient had a decrease in activity level because of a fear of falling?   No    Is the patient reluctant to leave their home because of a fear of falling?   No      Home Environment   Living Environment  Group home      Prior Function    Level of Independence  Needs assistance with ADLs;Needs assistance with homemaking;Requires assistive device for independence                Objective measurements completed on examination: See above findings.      LMN to be completed for current power wheelchair modifications - new custom molded seat and back to be completed upon approval from insurance company  LMN to be completed after quote with specs has been received from vendor - Deberah Pelton, [employer] with [employer]    List of repairs needed for current wheelchair also noted during wheelchair evaluation - these repairs are to be completed by [employer]         PT Long Term Goals - 11/26/17 1124      PT LONG TERM GOAL #1   Title  Pt will participate in custom molding for custom seat and back for current wheelchair - to be performed by Deberah Pelton, [employer].    Baseline  Current seat and back is no longer meetin pt's medical needs    Time  3    Period  Months    Status  New    Target Date  02/23/18             Plan - 11/26/17 1114    Clinical Impression Statement  Pt presents with severe postural abnormalities in current wheelchair including hips windswept to Lt side with Rt lateral trunk lean and pt sacral sitting due to spasticity.  Pt has hip and knee flexion contractures with both knees contractured at approx. 90 degrees of knee flexion.  Pt presents with decreased seat depth due to his sitting posture with pelvic obliquity noted.  Pt needs custom molded seat and back and a different seat belt (a 4 point padded seat belt is recommended to assist with positioning) as his current seat belt is putting pressure on his abdomen as he slides forward in wheelchair.  A LMN will be completed for these modifications.      History and Personal Factors relevant to plan of care:  spastic CP with quadriplegia    Clinical Presentation  Stable    Clinical Presentation due to:  CP    Clinical Decision Making  Low     Rehab Potential  Good    PT Frequency  1x / week    PT Duration  2 weeks    PT Treatment/Interventions  ADLs/Self Care Home Management;Patient/family education;Other (comment)   wheelchair  management for custom seating by wheelchair vendor   PT Next Visit Plan  perform custom molding for wheelchair - seat & back -with Deberah Pelton, [employer] - upon approval from [place]  [employer] to provide wheelchair modifications and repairs to current wheelchair    Consulted and Agree with Plan of Care  Patient;Other (Comment)       Patient will benefit from skilled therapeutic intervention in order to improve the following deficits and impairments:  Impaired tone, Hypomobility, Decreased strength, Postural dysfunction, Impaired sensation, Decreased balance, Decreased mobility, Impaired flexibility, Decreased range of motion  Visit Diagnosis: Spastic quadriplegic cerebral palsy (HCC) - Plan: PT plan of care cert/re-cert     Problem List Patient Active Problem List   Diagnosis Date Noted  . Hiatal hernia   . Cellulitis of right lower extremity without foot 08/03/2017  . Type 2 diabetes mellitus with complication, without long-term current use of insulin ([place]) 08/03/2017  . PNA (pneumonia) 05/07/2017  . SIRS (systemic inflammatory response syndrome) (HCC)   . Acute respiratory failure with hypoxia ([place]) 03/13/2017  . 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[place])   . Rhinovirus infection 08/17/2016  . Pressure injury of skin 08/16/2016  . Acute respiratory distress 08/15/2016  . Tachycardia 08/15/2016  . Sepsis ([place]) 08/15/2016  . Onychomycosis 02/20/2016  . Essential hypertension   . OSA (obstructive sleep apnea) 12/11/2015  . Chronic diastolic (congestive) heart failure ([place])   . Chronic venous insufficiency   . Depression with anxiety 09/20/2015  . CAP (community acquired pneumonia) 09/20/2015  . Hypersomnia 07/16/2015  . Asthma exacerbation  07/16/2015  . GERD (gastroesophageal reflux disease) 07/16/2015  . Hematemesis with nausea   . Fever 05/24/2014  . Dysphagia 09/16/2010  . 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[place]) 09/16/2010    Yashira Offenberger, [place], PT 11/26/2017, 11:30 AM  [facility] [address] [place], [place], [address] Phone: [phone]   Fax:  [phone]  Name: Thomas Ramos MRN: [id number] Date of Birth: 1970-09-18

## 2017-12-10 ENCOUNTER — Ambulatory Visit (INDEPENDENT_AMBULATORY_CARE_PROVIDER_SITE_OTHER): Payer: Medicare Other | Admitting: Podiatry

## 2017-12-10 ENCOUNTER — Encounter: Payer: Self-pay | Admitting: Podiatry

## 2017-12-10 DIAGNOSIS — M79675 Pain in left toe(s): Secondary | ICD-10-CM

## 2017-12-10 DIAGNOSIS — M79674 Pain in right toe(s): Secondary | ICD-10-CM | POA: Diagnosis not present

## 2017-12-10 DIAGNOSIS — E1142 Type 2 diabetes mellitus with diabetic polyneuropathy: Secondary | ICD-10-CM

## 2017-12-10 DIAGNOSIS — B351 Tinea unguium: Secondary | ICD-10-CM | POA: Diagnosis not present

## 2017-12-14 NOTE — Progress Notes (Signed)
Subjective: Thomas Ramos presents today with diabetes and cc of painful, discolored, thick toenails which interfere with daily activities and routine tasks.  He is here with his caretaker.  Mr. Shelp voices no new pedal concerns on today's visit.  Objective: Vascular Examination: Capillary refill time <3 seconds x 10 digits Dorsalis pedis and posterior tibial pulses present b/l No digital hair x 10 digits Skin temperature gradient WNL b/l  Dermatological Examination: Skin with normal turgor, texture b/l  Toenails 1-5 b/l discolored, thick, dystrophic with subungual debris and pain with palpation to nailbeds due to thickness of nails. Right great toe nailplate embedded distally.  Musculoskeletal: Muscle strength 5/5 to all LE muscle groups  Neurological: Sensation diminished with 10 gram monofilament.  Assessment: Painful onychomycosis toenails 1-5 b/l  NIDDM with peripheral neuropathy  Plan: 1. Toenails 1-5 b/l were debrided in length and girth without iatrogenic bleeding. 2. Patient to continue soft, supportive shoe gear 3. Patient to report any pedal injuries to medical professional immediately. 4. Follow up 3 months.  5. Patient/POA to call should there be a concern in the interim.

## 2017-12-15 ENCOUNTER — Telehealth: Payer: Self-pay | Admitting: Podiatry

## 2017-12-15 DIAGNOSIS — E1142 Type 2 diabetes mellitus with diabetic polyneuropathy: Secondary | ICD-10-CM

## 2017-12-15 DIAGNOSIS — R609 Edema, unspecified: Secondary | ICD-10-CM

## 2017-12-15 NOTE — Telephone Encounter (Signed)
Every time I put socks on my feet swell up. The best contact number to reach me at is [phone]. Thank you so much. Bye bye.

## 2017-12-15 NOTE — Telephone Encounter (Signed)
I called pt, pt states his feet swell in the compression socks but not regular socks. I asked if his caregivers applied the compression socks prior to putting him in his chair or in his chair with his feet elevated. Pt states they put them on in the chair cause he can't bend his legs. I told pt that also if he had creases in the hose at the ankle that would decrease the fluid from moving back out of his feet, to have his caregivers make sure the compression socks stay smooth and also have them elevate the feet through the day. I told pt that on days that the swelling is bad he may have the caregivers apply ace wraps to his feet beginning at the toes and rolling up the legs, and use until he can comfortably wear the compression socks again. Pt asked if he could get a [employer] and I told him he could in the first aid pharmacy section.

## 2017-12-20 ENCOUNTER — Emergency Department (HOSPITAL_COMMUNITY)
Admission: EM | Admit: 2017-12-20 | Discharge: 2017-12-20 | Disposition: A | Discharge status: Home / Self Care | Payer: Medicare Other | Attending: Emergency Medicine | Admitting: Emergency Medicine

## 2017-12-20 ENCOUNTER — Encounter (HOSPITAL_COMMUNITY): Payer: Self-pay

## 2017-12-20 DIAGNOSIS — R600 Localized edema: Secondary | ICD-10-CM

## 2017-12-20 DIAGNOSIS — I110 Hypertensive heart disease with heart failure: Secondary | ICD-10-CM | POA: Insufficient documentation

## 2017-12-20 DIAGNOSIS — Z7984 Long term (current) use of oral hypoglycemic drugs: Secondary | ICD-10-CM | POA: Insufficient documentation

## 2017-12-20 DIAGNOSIS — R2241 Localized swelling, mass and lump, right lower limb: Secondary | ICD-10-CM | POA: Insufficient documentation

## 2017-12-20 DIAGNOSIS — I5032 Chronic diastolic (congestive) heart failure: Secondary | ICD-10-CM | POA: Diagnosis not present

## 2017-12-20 DIAGNOSIS — E119 Type 2 diabetes mellitus without complications: Secondary | ICD-10-CM | POA: Diagnosis not present

## 2017-12-20 DIAGNOSIS — J45909 Unspecified asthma, uncomplicated: Secondary | ICD-10-CM | POA: Diagnosis not present

## 2017-12-20 DIAGNOSIS — Z79899 Other long term (current) drug therapy: Secondary | ICD-10-CM | POA: Insufficient documentation

## 2017-12-20 NOTE — Telephone Encounter (Signed)
I'm calling because I did what you suggested and my foot was so swollen that I ended up in the ER. If you could call me back at [phone]. Thank you and have a blessed day.

## 2017-12-20 NOTE — Telephone Encounter (Signed)
Left message for pt to call with ED diagnosis and the care he received.

## 2017-12-20 NOTE — Telephone Encounter (Signed)
Pt called and stated the ED doctor had said to go back into the compression socks. I apologized to the pt and told him I had hoped that wrapping the ace wraps from the toes up his leg would apply gently compression and help get the legs smaller so his compression socks would be more comfortable and easier to apply. I told pt I would inform Dr. Elisha Ponder.

## 2017-12-20 NOTE — Telephone Encounter (Signed)
Hi Val!  Because of his stature, he will most likely require custom hose. Can we check his insurance to see if custom hose would be covered?

## 2017-12-20 NOTE — Discharge Instructions (Signed)
Any dressings/wraps need to be loose enough to get a finger underneath them.

## 2017-12-20 NOTE — ED Triage Notes (Signed)
Pt from work via ems; c/o r foot swelling; pt has CP, uses wheelchair; caregiver wrapped both legs for control edema; pt c/o pain on bottom of R foot; per ems, R leg was wrapped so tightly that he was unable to get finger underneath; foot red and swollen on ems arrival; called PCP, PCP recommended pt got to ED for evaluation; ems cut R foot wrap, foot no longer red, PMS intact

## 2017-12-20 NOTE — ED Provider Notes (Signed)
[facility] EMERGENCY DEPARTMENT Provider Note   CSN: [id number] Arrival date & time: 12/20/17  1101     History   Chief Complaint Chief Complaint  Patient presents with  . Foot Pain    HPI Thomas Ramos is a 47 y.o. male.  HPI   47 year old male presenting for evaluation of right foot swelling.  Patient has cerebral palsy.  Nonambulatory.  He uses a wheelchair.  Legs are wrapped for edema.  Venous arrival they appear to be very tight so they removed some of them with improvement of symptoms.  Patient states that the symptoms currently feel better.  Past Medical History:  Diagnosis Date  . Anxiety   . Cellulitis 08/04/2017  . Cerebral palsy ([place])   . Chronic diastolic (congestive) heart failure ([place])   . CP (cerebral palsy) ([place])   . Diabetes mellitus without complication (HCC)    borderline  . Environmental allergies    takes inhalers if needed  . Esophageal stricture   . GERD (gastroesophageal reflux disease)   . Hypertension   . Motility disorder, esophageal   . Pneumonia   . 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 S/P Botox injection    approx every 4 months  . Seasonal allergies     Patient Active Problem List   Diagnosis Date Noted  . Hiatal hernia   . Cellulitis of right lower extremity without foot 08/03/2017  . Type 2 diabetes mellitus with complication, without long-term current use of insulin ([place]) 08/03/2017  . PNA (pneumonia) 05/07/2017  . SIRS (systemic inflammatory response syndrome) (HCC)   . Acute respiratory failure with hypoxia ([place]) 03/13/2017  . 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 Rhinovirus infection 08/17/2016  . Pressure injury of skin 08/16/2016  . Acute respiratory distress 08/15/2016  . Tachycardia 08/15/2016  . Sepsis ([place]) 08/15/2016  . Onychomycosis 02/20/2016  . Essential hypertension   . OSA (obstructive sleep apnea) 12/11/2015  . Chronic diastolic (congestive) heart failure ([place])   . Chronic venous  insufficiency   . Depression with anxiety 09/20/2015  . CAP (community acquired pneumonia) 09/20/2015  . Hypersomnia 07/16/2015  . Asthma exacerbation 07/16/2015  . GERD (gastroesophageal reflux disease) 07/16/2015  . Hematemesis with nausea   . Fever 05/24/2014  . Dysphagia 09/16/2010  . 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 BOTOX INJECTION N/A 12/21/2012   Procedure: BOTOX INJECTION;  Surgeon: Inda Castle, MD;  Location: [facility];  Service: Endoscopy;  Laterality: N/A;  . BOTOX INJECTION N/A 06/28/2014   Procedure: BOTOX INJECTION;  Surgeon: Inda Castle, MD;  Location: [place];  Service: Endoscopy;  Laterality: N/A;  . ESOPHAGOGASTRODUODENOSCOPY N/A 12/21/2012   Procedure: ESOPHAGOGASTRODUODENOSCOPY (EGD);  Surgeon: Inda Castle, MD;  Location: [facility];  Service: Endoscopy;  Laterality: N/A;  . ESOPHAGOGASTRODUODENOSCOPY N/A 06/28/2014   Procedure: ESOPHAGOGASTRODUODENOSCOPY (EGD);  Surgeon: Inda Castle, MD;  Location: [place];  Service: Endoscopy;  Laterality: N/A;  . ESOPHAGOGASTRODUODENOSCOPY (EGD) WITH PROPOFOL N/A 09/03/2017   Procedure: ESOPHAGOGASTRODUODENOSCOPY (EGD) WITH PROPOFOL;  Surgeon: Doran Stabler, MD;  Location: [facility];  Service: Gastroenterology;  Laterality: N/A;  . ESOPHAGUS SURGERY     stretched esophagus  . legs    . MOUTH SURGERY    . TENDON RELEASE          Home Medications    Prior to Admission medications   Medication Sig Start Date End Date Taking?  Authorizing Provider  azithromycin (ZITHROMAX) 250 MG tablet Take 1 tablet (250 mg total) by mouth daily. Take 1 tablet every day starting on Saturday until finished. 09/17/17   Montine Circle, PA-C  budesonide-formoterol (SYMBICORT) 80-4.5 MCG/ACT inhaler Inhale 2 puffs into the lungs 2 (two) times daily.    [provider]  [employer] CLOTRIMAZOLE 1 % cream Apply 1 application topically 2 (two) times  daily as needed (rash). BETWEEN TOES 08/19/17   [provider]  furosemide (LASIX) 20 MG tablet Take 20 mg by mouth daily. 07/15/17   [provider]  latanoprost (XALATAN) 0.005 % ophthalmic solution Place 1 drop into both eyes daily as needed (dry eyes).     [provider]  metFORMIN (GLUCOPHAGE) 500 MG tablet Take 1,000 mg by mouth at bedtime.     [provider]  montelukast (SINGULAIR) 10 MG tablet Take 10 mg by mouth daily.  08/12/16   [provider]  mupirocin ointment (BACTROBAN) 2 % Apply to affected as needed for rash 04/30/14   [provider]  naproxen sodium (ALEVE) 220 MG tablet Take 220-440 mg by mouth 2 (two) times daily as needed (for pain).    [provider]  nitroGLYCERIN (NITROSTAT) 0.4 MG SL tablet Place 0.4 mg under the tongue every 5 (five) minutes as needed for chest pain.     [provider]  ondansetron (ZOFRAN) 4 MG tablet Take 1 tablet (4 mg total) by mouth every 6 (six) hours. Patient taking differently: Take 4 mg by mouth every 6 (six) hours as needed for nausea or vomiting.  02/05/17   Orpah Greek, MD  potassium chloride (K-DUR) 10 MEQ tablet Take 10 mEq by mouth daily.    [provider]  rosuvastatin (CRESTOR) 5 MG tablet Take 5 mg by mouth at bedtime.    [provider]    Family History Family History  Problem Relation Age of Onset  . Hypertension Father   . Lung cancer Father   . Diabetes Mother     Social History Social History   Tobacco Use  . Smoking status: Never Smoker  . Smokeless tobacco: Former Systems developer  . Tobacco comment: smoked one time  Substance Use Topics  . Alcohol use: Yes    Comment: 2 beers every other Friday and Saturday  . Drug use: No     Allergies   Penicillins; Sulfamethoxazole-trimethoprim; and Metronidazole   Review of Systems Review of Systems  All systems reviewed and negative, other than as noted in HPI.  Physical  Exam Updated Vital Signs BP 108/61 (BP Location: Left Arm)   Pulse 100   Temp 98.4 F (36.9 C) (Oral)   Resp (!) 22   Wt 73.9 kg   SpO2 99%   BMI 27.11 kg/m   Physical Exam  Constitutional: He appears well-developed and well-nourished. No distress.  Laying in bed.  No acute distress.  Contractures noted.  HENT:  Head: Normocephalic and atraumatic.  Eyes: Conjunctivae are normal. Right eye exhibits no discharge. Left eye exhibits no discharge.  Neck: Neck supple.  Cardiovascular: Normal rate, regular rhythm and normal heart sounds. Exam reveals no gallop and no friction rub.  No murmur heard. Pulmonary/Chest: Effort normal and breath sounds normal. No respiratory distress.  Abdominal: Soft. He exhibits no distension. There is no tenderness.  Musculoskeletal: He exhibits edema. He exhibits no tenderness.  Bilateral foot/ankles wrapped very tightly.  Diffuse swelling of the right foot.  Bandages were removed.  Improvement in  the swelling.  Symptoms essentially resolved.  Neurological: He is alert.  Skin: Skin is warm and dry.  Psychiatric: He has a normal mood and affect. His behavior is normal. Thought content normal.  Nursing note and vitals reviewed.    ED Treatments / Results  Labs (all labs ordered are listed, but only abnormal results are displayed) Labs Reviewed - No data to display  EKG None  Radiology No results found.  Procedures Procedures (including critical care time)  Medications Ordered in ED Medications - No data to display   Initial Impression / Assessment and Plan / ED Course  I have reviewed the triage vital signs and the nursing notes.  Pertinent labs & imaging results that were available during my care of the patient were reviewed by me and considered in my medical decision making (see chart for details).     47yM with R foot pain/swelling. Legs/feet were wrapped too tightly. Symptoms markedly improved once removed. I would recommend  compression stockings instead or they should be wrapped at least loose enough to get a finger underneath the bandages.   Final Clinical Impressions(s) / ED Diagnoses   Final diagnoses:  Edema of right foot    ED Discharge Orders    None       Virgel Manifold, MD 12/25/17 1902

## 2017-12-20 NOTE — ED Notes (Signed)
PTAR CALLED @ 1144-PER MEGAN, RN-CALLED BY Holiday Mcmenamin

## 2017-12-21 NOTE — Telephone Encounter (Addendum)
Pt states he would like to use [facility]. I faxed order for custom measured compression hose 20-90mmHg B/L knee-length to [facility] and emailed to A. Barnet [place].

## 2017-12-21 NOTE — Telephone Encounter (Signed)
Sounds good Val! Thank you!

## 2017-12-21 NOTE — Addendum Note (Signed)
Addended by: Harriett Sine D on: 12/21/2017 11:08 AM   Modules accepted: Orders

## 2017-12-24 ENCOUNTER — Emergency Department (HOSPITAL_COMMUNITY)
Admission: EM | Admit: 2017-12-24 | Discharge: 2017-12-25 | Disposition: A | Discharge status: Home / Self Care | Payer: Medicare Other | Attending: Emergency Medicine | Admitting: Emergency Medicine

## 2017-12-24 ENCOUNTER — Other Ambulatory Visit: Payer: Self-pay

## 2017-12-24 ENCOUNTER — Emergency Department (HOSPITAL_COMMUNITY): Payer: Medicare Other

## 2017-12-24 DIAGNOSIS — I110 Hypertensive heart disease with heart failure: Secondary | ICD-10-CM | POA: Diagnosis not present

## 2017-12-24 DIAGNOSIS — Z79899 Other long term (current) drug therapy: Secondary | ICD-10-CM | POA: Insufficient documentation

## 2017-12-24 DIAGNOSIS — E119 Type 2 diabetes mellitus without complications: Secondary | ICD-10-CM | POA: Insufficient documentation

## 2017-12-24 DIAGNOSIS — Z794 Long term (current) use of insulin: Secondary | ICD-10-CM | POA: Diagnosis not present

## 2017-12-24 DIAGNOSIS — J45901 Unspecified asthma with (acute) exacerbation: Secondary | ICD-10-CM | POA: Insufficient documentation

## 2017-12-24 DIAGNOSIS — I5032 Chronic diastolic (congestive) heart failure: Secondary | ICD-10-CM | POA: Diagnosis not present

## 2017-12-24 DIAGNOSIS — R0789 Other chest pain: Secondary | ICD-10-CM

## 2017-12-24 LAB — CBG MONITORING, ED: Glucose-Capillary: 130 mg/dL — ABNORMAL HIGH (ref 70–99)

## 2017-12-24 LAB — D-DIMER, QUANTITATIVE (NOT AT ARMC)

## 2017-12-24 MED ORDER — KETOROLAC TROMETHAMINE 15 MG/ML IJ SOLN
15.0000 mg | Freq: Once | INTRAMUSCULAR | Status: AC
  Administered 2017-12-24: 15 mg via INTRAVENOUS
  Filled 2017-12-24: qty 1

## 2017-12-24 NOTE — ED Provider Notes (Signed)
[facility] EMERGENCY DEPARTMENT Provider Note   CSN: [id number] Arrival date & time: 12/24/17  2019     History   Chief Complaint Chief Complaint  Patient presents with  . Chest Pain    HPI Thomas Ramos is a 47 y.o. male.  HPI   47 year old male with PMH significant for CP, quadriplegia, half path, borderline diabetes who presents with acute onset of lateral chest pain.  Patient states that roughly 1 year ago he had a fall and fractured several ribs on his left side.  Patient is he has been asymptomatic for roughly 1 year.  This evening, after coming home from work, patient was sitting in his wheelchair when he had acute onset of left-sided "rib pain."  Patient described pain as 8/10 initial onset, currently 6/10 pain without intervention.  Pain is sharp, nonpleuritic nonradiating, nothing makes it better, worse with palpation, says feels exactly like his prior rib pain.  Denies recent infectious symptoms including cough congestion runny nose.  Denies recent falls.  Denies nausea or vomiting.  Patient denies history of hypertension hyperlipidemia or family history of heart attack in the age of 4.  Denies history of PAD.  Denies recent swelling of his bilateral lower extremities.  Of note patient was seen here recently for redness and swelling of the lower extremity thought at that time to be secondary to a tight leg wrap.  Past Medical History:  Diagnosis Date  . Anxiety   . Cellulitis 08/04/2017  . Cerebral palsy ([place])   . Chronic diastolic (congestive) heart failure ([place])   . CP (cerebral palsy) ([place])   . Diabetes mellitus without complication (HCC)    borderline  . Environmental allergies    takes inhalers if needed  . Esophageal stricture   . GERD (gastroesophageal reflux disease)   . Hypertension   . Motility disorder, esophageal   . Pneumonia   . 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 S/P Botox injection    approx every 4 months  . Seasonal  allergies     Patient Active Problem List   Diagnosis Date Noted  . Hiatal hernia   . Cellulitis of right lower extremity without foot 08/03/2017  . Type 2 diabetes mellitus with complication, without long-term current use of insulin ([place]) 08/03/2017  . PNA (pneumonia) 05/07/2017  . SIRS (systemic inflammatory response syndrome) (HCC)   . Acute respiratory failure with hypoxia ([place]) 03/13/2017  . 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 Rhinovirus infection 08/17/2016  . Pressure injury of skin 08/16/2016  . Acute respiratory distress 08/15/2016  . Tachycardia 08/15/2016  . Sepsis ([place]) 08/15/2016  . Onychomycosis 02/20/2016  . Essential hypertension   . OSA (obstructive sleep apnea) 12/11/2015  . Chronic diastolic (congestive) heart failure ([place])   . Chronic venous insufficiency   . Depression with anxiety 09/20/2015  . CAP (community acquired pneumonia) 09/20/2015  . Hypersomnia 07/16/2015  . Asthma exacerbation 07/16/2015  . GERD (gastroesophageal reflux disease) 07/16/2015  . Hematemesis with nausea   . Fever 05/24/2014  . Dysphagia 09/16/2010  . 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 BOTOX INJECTION N/A 12/21/2012   Procedure: BOTOX INJECTION;  Surgeon: Inda Castle, MD;  [facility];  Service: Endoscopy;  Laterality: N/A;  . BOTOX INJECTION N/A 06/28/2014   Procedure: BOTOX INJECTION;  Surgeon: Inda Castle, MD;  Location: [facility];  Service: Endoscopy;  Laterality: N/A;  . ESOPHAGOGASTRODUODENOSCOPY  N/A 12/21/2012   Procedure: ESOPHAGOGASTRODUODENOSCOPY (EGD);  Surgeon: Inda Castle, MD;  Location: [facility];  Service: Endoscopy;  Laterality: N/A;  . ESOPHAGOGASTRODUODENOSCOPY N/A 06/28/2014   Procedure: ESOPHAGOGASTRODUODENOSCOPY (EGD);  Surgeon: Inda Castle, MD;  Location: [place];  Service: Endoscopy;  Laterality: N/A;  . ESOPHAGOGASTRODUODENOSCOPY (EGD) WITH PROPOFOL N/A  09/03/2017   Procedure: ESOPHAGOGASTRODUODENOSCOPY (EGD) WITH PROPOFOL;  Surgeon: Doran Stabler, MD;  Location[facility];  Service: Gastroenterology;  Laterality: N/A;  . ESOPHAGUS SURGERY     stretched esophagus  . legs    . MOUTH SURGERY    . TENDON RELEASE          Home Medications    Prior to Admission medications   Medication Sig Start Date End Date Taking? Authorizing Provider  azithromycin (ZITHROMAX) 250 MG tablet Take 1 tablet (250 mg total) by mouth daily. Take 1 tablet every day starting on Saturday until finished. 09/17/17   Montine Circle, PA-C  budesonide-formoterol (SYMBICORT) 80-4.5 MCG/ACT inhaler Inhale 2 puffs into the lungs 2 (two) times daily.    [provider]  [employer] CLOTRIMAZOLE 1 % cream Apply 1 application topically 2 (two) times daily as needed (rash). BETWEEN TOES 08/19/17   [provider]  furosemide (LASIX) 20 MG tablet Take 20 mg by mouth daily. 07/15/17   [provider]  latanoprost (XALATAN) 0.005 % ophthalmic solution Place 1 drop into both eyes daily as needed (dry eyes).     [provider]  metFORMIN (GLUCOPHAGE) 500 MG tablet Take 1,000 mg by mouth at bedtime.     [provider]  montelukast (SINGULAIR) 10 MG tablet Take 10 mg by mouth daily.  08/12/16   [provider]  mupirocin ointment (BACTROBAN) 2 % Apply to affected as needed for rash 04/30/14   [provider]  naproxen sodium (ALEVE) 220 MG tablet Take 220-440 mg by mouth 2 (two) times daily as needed (for pain).    [provider]  nitroGLYCERIN (NITROSTAT) 0.4 MG SL tablet Place 0.4 mg under the tongue every 5 (five) minutes as needed for chest pain.     [provider]  ondansetron (ZOFRAN) 4 MG tablet Take 1 tablet (4 mg total) by mouth every 6 (six) hours. Patient taking differently: Take 4 mg by mouth every 6 (six) hours as needed for nausea or vomiting.  02/05/17   Orpah Greek, MD  potassium  chloride (K-DUR) 10 MEQ tablet Take 10 mEq by mouth daily.    [provider]  rosuvastatin (CRESTOR) 5 MG tablet Take 5 mg by mouth at bedtime.    [provider]    Family History Family History  Problem Relation Age of Onset  . Hypertension Father   . Lung cancer Father   . Diabetes Mother     Social History Social History   Tobacco Use  . Smoking status: Never Smoker  . Smokeless tobacco: Former Systems developer  . Tobacco comment: smoked one time  Substance Use Topics  . Alcohol use: Yes    Comment: 2 beers every other Friday and Saturday  . Drug use: No     Allergies   Penicillins; Sulfamethoxazole-trimethoprim; and Metronidazole   Review of Systems Review of Systems  Constitutional: Negative for chills and fever.  HENT: Negative for congestion, ear pain, rhinorrhea and sore throat.   Eyes: Negative for pain and visual disturbance.  Respiratory: Negative for cough and shortness of breath.   Cardiovascular: Negative for chest pain and  palpitations.  Gastrointestinal: Negative for abdominal pain, constipation, diarrhea, nausea and vomiting.  Genitourinary: Negative for dysuria and hematuria.  Musculoskeletal: Positive for arthralgias. Negative for back pain.  Skin: Negative for color change and rash.  Neurological: Negative for seizures and syncope.  All other systems reviewed and are negative.    Physical Exam Updated Vital Signs BP (!) 97/42 (BP Location: Left Arm)   Pulse 90   Temp 97.7 F (36.5 C) (Oral)   Resp 18   SpO2 94%   Physical Exam  Constitutional: He appears well-developed and well-nourished.  Non-toxic appearance. He does not appear ill.  HENT:  Head: Normocephalic and atraumatic.  Eyes: Conjunctivae are normal.  Neck: Neck supple.  Cardiovascular: Normal rate and regular rhythm.  No murmur heard. Pulmonary/Chest: Effort normal and breath sounds normal. No accessory muscle usage. No tachypnea. No respiratory distress. He has no  decreased breath sounds. He has no wheezes. He has no rhonchi. He has no rales. He exhibits tenderness.    Abdominal: Soft. There is no tenderness.  Musculoskeletal: He exhibits no edema.       Right lower leg: He exhibits no edema.       Left lower leg: He exhibits no edema.  Contractures noted bilateral lower and upper extremities.  Neurological: He is alert.  Skin: Skin is warm and dry. Capillary refill takes less than 2 seconds.  Psychiatric: He has a normal mood and affect.  Nursing note and vitals reviewed.    ED Treatments / Results  Labs (all labs ordered are listed, but only abnormal results are displayed) Labs Reviewed  CBG MONITORING, ED - Abnormal; Notable for the following components:      Result Value   Glucose-Capillary 130 (*)    All other components within normal limits  D-DIMER, QUANTITATIVE (NOT AT [facility])    EKG EKG Interpretation  Date/Time:  Friday December 24 2017 21:59:51 EDT Ventricular Rate:  87 PR Interval:  138 QRS Duration: 86 QT Interval:  378 QTC Calculation: 858 R Axis:   99 Text Interpretation:  Normal sinus rhythm Rightward axis Septal infarct , age undetermined No significant change since last tracing Confirmed by Blanchie Dessert [phone]) on 12/24/2017 10:54:09 PM   Radiology Dg Chest 2 View  Result Date: 12/24/2017 CLINICAL DATA:  LEFT-sided rib pain, chest pain. History of CHF, diabetes, hypertension, pneumonia, quadriplegic spinal paralysis. EXAM: CHEST - 2 VIEW COMPARISON:  Chest x-ray dated 09/16/2017. FINDINGS: Heart size and mediastinal contours are stable. Again noted are low lung volumes. Lungs are clear. Stable elevation of the RIGHT hemidiaphragm. No acute or suspicious osseous finding. IMPRESSION: No active cardiopulmonary disease. No evidence of pneumonia or pulmonary edema. Electronically Signed   By: Franki Cabot M.D.   On: 12/24/2017 21:28    Procedures Procedures (including critical care time)  Medications Ordered in  ED Medications  ketorolac (TORADOL) 15 MG/ML injection 15 mg (15 mg Intravenous Given 12/24/17 2228)     Initial Impression / Assessment and Plan / ED Course  I have reviewed the triage vital signs and the nursing notes.  Pertinent labs & imaging results that were available during my care of the patient were reviewed by me and considered in my medical decision making (see chart for details).     47 year old male with PMH significant for CP, quadriplegia, half path, borderline diabetes who presents with acute onset of lateral chest pain. History as above. Hx consistent with musculoskeletal pain. Due to hx of erythematous swollen LE and being  wheelchair bound, will obtain ddimer.   Patient given toradol for pain.   Labs performed. CXR negative for fracture or s/s of pneumonia/pneumothorax/free air. Ddimer negative  EKG WNL  Patient with likely muscle skeletal pain.  Advised ibuprofen for symptoms.  Patient stable for discharge. Patient seen in conjunction with my attending, Dr. Maryan Rued, who agrees with plan and disposition.   Final Clinical Impressions(s) / ED Diagnoses   Final diagnoses:  Chest wall pain    ED Discharge Orders    None       Keenan Bachelor, MD 12/25/17 [id number]    Blanchie Dessert, MD 12/26/17 2011

## 2017-12-24 NOTE — ED Triage Notes (Signed)
Per ems pt is having left sided rib pain. Harder to take a deep breath hurts to cough.120/70, 80, 18, 98% ra

## 2017-12-29 ENCOUNTER — Telehealth: Payer: Self-pay | Admitting: Podiatry

## 2017-12-29 NOTE — Telephone Encounter (Signed)
I'm calling because I have a place on the bottom of my foot. My best call back number is [phone].

## 2017-12-29 NOTE — Telephone Encounter (Signed)
Marcy Siren, this is Warnell Forester. When you get a chance, please call me back at [phone]. Thank you.

## 2017-12-30 NOTE — Telephone Encounter (Signed)
I'm calling to speak to [facility]. I'm calling to speak to you about pain I'm having in my right foot. My number is [phone].

## 2017-12-30 NOTE — Telephone Encounter (Signed)
I spoke with pt and he states is right foot is hurting and he thinks it is his compression socks. I told pt we would need to check the circulation, and fit, offered appt 12/31/2017 at 10:30am with pt knowledge that he would need to be in office at 10:45am.

## 2017-12-31 ENCOUNTER — Encounter: Payer: Self-pay | Admitting: Podiatry

## 2017-12-31 ENCOUNTER — Ambulatory Visit (INDEPENDENT_AMBULATORY_CARE_PROVIDER_SITE_OTHER): Payer: Medicare Other

## 2017-12-31 ENCOUNTER — Ambulatory Visit (INDEPENDENT_AMBULATORY_CARE_PROVIDER_SITE_OTHER): Payer: Medicare Other | Admitting: Podiatry

## 2017-12-31 ENCOUNTER — Other Ambulatory Visit: Payer: Self-pay | Admitting: Podiatry

## 2017-12-31 DIAGNOSIS — M722 Plantar fascial fibromatosis: Secondary | ICD-10-CM

## 2017-12-31 DIAGNOSIS — M79671 Pain in right foot: Secondary | ICD-10-CM

## 2018-01-03 ENCOUNTER — Encounter: Payer: Self-pay | Admitting: Podiatry

## 2018-01-03 ENCOUNTER — Telehealth: Payer: Self-pay | Admitting: *Deleted

## 2018-01-03 NOTE — Telephone Encounter (Signed)
Clinicals completed. Thanks!

## 2018-01-03 NOTE — Telephone Encounter (Signed)
Pt called to leave information concerning the wheelchair modifications with [employer].

## 2018-01-03 NOTE — Progress Notes (Signed)
Subjective: Thomas Ramos presents today with cc of right heel pain. He relates he made a visit to the ER because pain was so bad. He is accompanied by caretaker, Thomas Ramos. Thomas Ramos states Thomas Ramos recently had his foot pedals adjusted on his motorized chair and they are very tight.  Pain is located on posterior/plantar right heel. He denies any wounds.    Objective: Vascular Examination: Capillary refill time <3 seconds x 10 digits Dorsalis pedis and posterior tibial pulses present b/l No digital hair x 10 digits Skin temperature gradient WNL b/l  Dermatological Examination: Skin with normal turgor, texture b/l  Toenails 1-5 b/l show evidence of recent debridement and are discolored, thick, dystrophic.   Motorized wheelchair assessment: Left and right foot pedals with left foot pedal noted to be superior to right foot pedal.  There is pain on palpation to posterior and plantar right heel.  Musculoskeletal: Right foot noted to be resting on left foot pedal which could be contributing to pain of right heel.  The scooter plate is very hard and provides no cushion for his feet.  Muscle strength 5/5 to all LE muscle groups  Neurological: Sensation diminished with 10 gram monofilament.  Assessment: Heel pain due to pressure from foot pedal right foot  Xrays show no fracture nor osteolysis of bone.   Plan: 1. Provided heel pillow for pressure to right foot. He is to wear daily until motorized chair foot pedals are modified with gel pads.  Thomas Ramos also needs to be cued to reposition his body in his chair where his right foot is on the right pedal. Will send order to [employer] for chair to be modified. Gel pads should be placed on both foot pedals for cushion. 2. Xray reviewed right foot with patient and caretaker, Thomas Ramos. 3. Patient to continue soft, supportive shoe gear. Recommended avoiding leather shoes and try shoes with mesh or stretchable uppers. 4. Patient to report any pedal  injuries to medical professional immediately. 5. Keep appointment for routine nail care. 6. Patient/POA to call should there be a concern in the interim

## 2018-01-03 NOTE — Telephone Encounter (Signed)
Left message informing pt I had spoken to a representative from [employer] and would be sending a prescription to them for the wheelchair B/L gel foot pads.

## 2018-01-03 NOTE — Telephone Encounter (Signed)
I spoke with Raquel Sarna - [employer], she states she spoke with pt today and instructed to have our office fax rx for the gel foot rest. I will fax 12/31/2017 clinicals when available.

## 2018-01-04 ENCOUNTER — Ambulatory Visit: Payer: Medicare Other | Admitting: Gastroenterology

## 2018-01-04 NOTE — Telephone Encounter (Signed)
Faxed rx for wheelchair modification with B/L gel foot pads to [employer].

## 2018-01-04 NOTE — Telephone Encounter (Signed)
Thank you :)

## 2018-01-07 NOTE — Telephone Encounter (Signed)
I spoke with Raquel Sarna - [employer], she stated pt called right after I called in the order. Raquel Sarna states the order has been sent to the work que and she will have me contacted with the status.

## 2018-01-07 NOTE — Telephone Encounter (Signed)
I called pt, he asked if he could go back into his shoe. I told pt if he no longer had the pain and redness to the right leg and the foot did not he the opposite foot pad he could try the shoe. Pt states understanding. I asked pt if [employer] had called and he stated not yet. I told pt to call for status and to setup appt with [employer] to modify the wheelchair.

## 2018-01-07 NOTE — Telephone Encounter (Signed)
I'm calling the nurse back. She can reach me at [phone].

## 2018-01-07 NOTE — Telephone Encounter (Signed)
Thomas Ramos, please call me when you can at [phone]. Thank you.

## 2018-01-25 ENCOUNTER — Telehealth: Payer: Self-pay | Admitting: Podiatry

## 2018-01-25 NOTE — Telephone Encounter (Signed)
Left message instructing pt he could wear a sock, make sure there were not areas of rubbing or pressure to the feet.

## 2018-01-25 NOTE — Telephone Encounter (Signed)
Pt is having trouble/pain with right foot. Patient wants to know if he is able to wear a regular sock on that foot instead of what was suggested or will it cause any issues.  Please give the patient a call.

## 2018-01-31 ENCOUNTER — Ambulatory Visit: Payer: Medicare Other | Attending: Family Medicine | Admitting: Physical Therapy

## 2018-01-31 DIAGNOSIS — G800 Spastic quadriplegic cerebral palsy: Secondary | ICD-10-CM | POA: Diagnosis present

## 2018-01-31 DIAGNOSIS — R29898 Other symptoms and signs involving the musculoskeletal system: Secondary | ICD-10-CM | POA: Diagnosis present

## 2018-02-01 ENCOUNTER — Encounter: Payer: Self-pay | Admitting: Physical Therapy

## 2018-02-01 ENCOUNTER — Other Ambulatory Visit: Payer: Self-pay

## 2018-02-01 NOTE — Therapy (Signed)
[facility] [address] [place], [place], [address] Phone: [phone]   Fax:  [phone]  Physical Therapy Evaluation  Patient Details  Name: Thomas Ramos MRN: [id number] Date of Birth: January 22, 1971 Referring Provider (PT): Aura Dials, [place]   Encounter Date: 01/31/2018  PT End of Session - 02/01/18 2150    Visit Number  1    Number of Visits  4    Date for PT Re-Evaluation  03/02/18    Authorization Type  Medicare/Medicaid  01/31/18 to 04/01/2018    PT Start Time  1412    PT Stop Time  1453    PT Time Calculation (min)  41 min    Activity Tolerance  Patient tolerated treatment well    Behavior During Therapy  [facility] for tasks assessed/performed       Past Medical History:  Diagnosis Date  . Anxiety   . Cellulitis 08/04/2017  . Cerebral palsy ([place])   . Chronic diastolic (congestive) heart failure ([place])   . CP (cerebral palsy) ([place])   . Diabetes mellitus without complication (HCC)    borderline  . Environmental allergies    takes inhalers if needed  . Esophageal stricture   . GERD (gastroesophageal reflux disease)   . Hypertension   . Motility disorder, esophageal   . Pneumonia   . 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 S/P Botox injection    approx every 4 months  . Seasonal allergies     Past Surgical History:  Procedure Laterality Date  . BOTOX INJECTION N/A 12/21/2012   Procedure: BOTOX INJECTION;  Surgeon: Inda Castle, MD;  [facility]: [facility];  Service: Endoscopy;  Laterality: N/A;  . BOTOX INJECTION N/A 06/28/2014   Procedure: BOTOX INJECTION;  Surgeon: Inda Castle, MD;  Location: [facility];  Service: Endoscopy;  Laterality: N/A;  . ESOPHAGOGASTRODUODENOSCOPY N/A 12/21/2012   Procedure: ESOPHAGOGASTRODUODENOSCOPY (EGD);  Surgeon: Inda Castle, MD;  Location: [facility];  Service: Endoscopy;  Laterality: N/A;  . ESOPHAGOGASTRODUODENOSCOPY N/A 06/28/2014   Procedure:  ESOPHAGOGASTRODUODENOSCOPY (EGD);  Surgeon: Inda Castle, MD;  Location: [facility];  Service: Endoscopy;  Laterality: N/A;  . ESOPHAGOGASTRODUODENOSCOPY (EGD) WITH PROPOFOL N/A 09/03/2017   Procedure: ESOPHAGOGASTRODUODENOSCOPY (EGD) WITH PROPOFOL;  Surgeon: Doran Stabler, MD;  [facility];  Service: Gastroenterology;  Laterality: N/A;  . ESOPHAGUS SURGERY     stretched esophagus  . legs    . MOUTH SURGERY    . TENDON RELEASE      There were no vitals filed for this visit.   Subjective Assessment - 01/31/18 2133    Subjective  Reports he is here to see if he can gain ROM to improve his comfort when seated in his power chair (especially RLE and trunk)    Patient is accompained by:  --   caregiver, Lulu   Pertinent History  spastic CP    Patient Stated Goals  be more comfortable in his wheelchair and not slide down in chair    Currently in Pain?  No/denies   at rest; fearful of pain if moves RLE        [facility] PT Assessment - 01/31/18 2136      Assessment   Medical Diagnosis  Spastic CP with quadriplegia     Referring Provider (PT)  Aura Dials, PA-C    Onset Date/Surgical Date  --   Congenital    Prior Therapy  2017 OPPT x 3 visits; 2019 w/c  evaluation 11/26/17      Precautions   Precautions  Fall      Balance Screen   Has the patient fallen in the past 6 months  Yes    How many times?  1    Has the patient had a decrease in activity level because of a fear of falling?   No    Is the patient reluctant to leave their home because of a fear of falling?   No      Home Environment   Living Environment  Group home      Prior Function   Level of Independence  Needs assistance with ADLs;Needs assistance with homemaking;Requires assistive device for independence      Posture/Postural Control   Posture/Postural Control  Postural limitations    Postural Limitations  Rounded Shoulders;Forward head;Posterior pelvic tilt;Weight shift right   rt trunk  shortened with rt shoulder lower than left   Posture Comments  Pt is waiting for new molded back and cushion to improve his posture      Tone   Assessment Location  Right Lower Extremity;Left Lower Extremity      ROM / Strength   AROM / PROM / Strength  PROM      PROM   Overall PROM   Deficits    PROM Assessment Site  Knee;Ankle    Right/Left Knee  Right;Left    Right Knee Extension  -90   in 90 degrees flexion   Left Knee Extension  -65   in 65 of flexion   Right Ankle Inversion  0   from everted position, easily brought into neutral     RLE Tone   RLE Tone  Modified Ashworth      RLE Tone   Modified Ashworth Scale for Grading Hypertonia RLE  Affected part(s) rigid in flexion or extension      LLE Tone   LLE Tone  Modified Ashworth      LLE Tone   Modified Ashworth Scale for Grading Hypertonia LLE  Considerable increase in muschle tone, passive movement difficult                Objective measurements completed on examination: See above findings.              PT Education - 02/01/18 2147    Education Details  role of PT and to complete stretching will need to have his hoyer pad underneath him in w/c so he can be lifted onto mat table via clinic hoyer lift    Person(s) Educated  Patient;Caregiver(s)    Methods  Explanation    Comprehension  Verbalized understanding          PT Long Term Goals - 02/02/18 0449      PT LONG TERM GOAL #1   Title  Patient will be able to direct caregivers in [place] [facility] to improve sitting position and sitting tolerance in w/c (Target all LTGs 03/04/2018)    Time  4    Period  Weeks    Status  New    Target Date  03/04/18      PT LONG TERM GOAL #2   Title  Complete bracing assessment vs need for foot rest modifications for improved positioning of RLE in wheelchair    Time  4    Period  Weeks    Status  New      PT LONG TERM GOAL #3   Title  Right knee PROM into knee extension  will improve to -70  degrees.     Time  4    Period  Weeks    Status  New            Plan - 01/31/18 2133    Clinical Impression Statement  Patient referred for OPPT for ROM and strengthening with history of spastic cerebral palsy. Patient completed wheelchair evaluation at this clinic 11/25/17 and is awaiting approval for updated/replacement w/c parts as he does not yet qualify for a new wheelchair. Patient reports increased discomfort with RLE and rt foot positioning and wonders if PT and stretching would improve sitting tolerance. (NOTE-checked with Guido Sander, PT who completed his wheelchair evaluation and there were no plans at that time to change anything about his foot rests). ROM assessment completed with pt in his w/c due to he came to clinic without a hoyer pad. Lengthy discussion re: need for him to have hoyer pad underneath him to allow transfer out of wheelchair for PT/ROM/stretching to be effective. He was very reluctant to agree to this due to a prior fall while being lifted using a hoyer and currently his caregivers lift him manually. Ultimately he decided he wants to receive PT and will allow use of hoyer lift. Goals of PT include instructing caregiver how to perform home stretching program to improve his sitting position and lessen his pain while in his chair.     History and Personal Factors relevant to plan of care:  spastic CP with quadriplegia    Clinical Presentation  Stable    Clinical Presentation due to:  CP since birth    Clinical Decision Making  Low    Rehab Potential  Fair    Clinical Impairments Affecting Rehab Potential  expected progression of patient due to chronicity of muscle contractures; home situation (?support/carryover of plan with his multiple caregivers)    PT Frequency  1x / week    PT Duration  3 weeks    PT Treatment/Interventions  ADLs/Self Care Home Management;Patient/family education;Therapeutic exercise;Neuromuscular re-education;Orthotic Fit/Training;Manual  techniques;Passive range of motion    PT Next Visit Plan  hoyer out of w/c to hi/lo table; further assess RLE and ?beneftit from air cast and ?skin tolerance for brace; initiate caregiver education on PROM/positioining for bil LEs and torso    Consulted and Agree with Plan of Care  Patient;Other (Comment)   caregiver, Lulu           Patient will benefit from skilled therapeutic intervention in order to improve the following deficits and impairments:  Impaired tone, Hypomobility, Decreased strength, Postural dysfunction, Impaired sensation, Decreased balance, Decreased mobility, Impaired flexibility, Decreased range of motion  Visit Diagnosis: No diagnosis found.     Problem List Patient Active Problem List   Diagnosis Date Noted  . Hiatal hernia   . Cellulitis of right lower extremity without foot 08/03/2017  . Type 2 diabetes mellitus with complication, without long-term current use of insulin ([place]) 08/03/2017  . PNA (pneumonia) 05/07/2017  . SIRS (systemic inflammatory response syndrome) (HCC)   . Acute respiratory failure with hypoxia ([place]) 03/13/2017  . 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 Rhinovirus infection 08/17/2016  . Pressure injury of skin 08/16/2016  . Acute respiratory distress 08/15/2016  . Tachycardia 08/15/2016  . Sepsis ([place]) 08/15/2016  . Onychomycosis 02/20/2016  . Essential hypertension   . OSA (obstructive sleep apnea) 12/11/2015  . Chronic diastolic (congestive) heart failure ([place])   . Chronic venous insufficiency   . Depression with anxiety  09/20/2015  . CAP (community acquired pneumonia) 09/20/2015  . Hypersomnia 07/16/2015  . Asthma exacerbation 07/16/2015  . GERD (gastroesophageal reflux disease) 07/16/2015  . Hematemesis with nausea   . Fever 05/24/2014  . Dysphagia 09/16/2010  . 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 [place], [place], [address] Phone: [phone]   Fax:  [phone]  Name: JATORIAN RENAULT MRN: [id number] Date of Birth: 05-29-1970

## 2018-02-03 ENCOUNTER — Other Ambulatory Visit: Payer: Self-pay

## 2018-02-03 ENCOUNTER — Encounter (HOSPITAL_COMMUNITY): Payer: Self-pay | Admitting: Emergency Medicine

## 2018-02-03 ENCOUNTER — Emergency Department (HOSPITAL_COMMUNITY): Payer: Medicare Other

## 2018-02-03 ENCOUNTER — Emergency Department (HOSPITAL_COMMUNITY)
Admission: EM | Admit: 2018-02-03 | Discharge: 2018-02-03 | Disposition: A | Discharge status: Home / Self Care | Payer: Medicare Other | Attending: Emergency Medicine | Admitting: Emergency Medicine

## 2018-02-03 DIAGNOSIS — J45909 Unspecified asthma, uncomplicated: Secondary | ICD-10-CM | POA: Insufficient documentation

## 2018-02-03 DIAGNOSIS — Z7984 Long term (current) use of oral hypoglycemic drugs: Secondary | ICD-10-CM | POA: Insufficient documentation

## 2018-02-03 DIAGNOSIS — M25571 Pain in right ankle and joints of right foot: Secondary | ICD-10-CM | POA: Insufficient documentation

## 2018-02-03 DIAGNOSIS — R52 Pain, unspecified: Secondary | ICD-10-CM

## 2018-02-03 DIAGNOSIS — I5032 Chronic diastolic (congestive) heart failure: Secondary | ICD-10-CM | POA: Diagnosis not present

## 2018-02-03 DIAGNOSIS — R600 Localized edema: Secondary | ICD-10-CM | POA: Insufficient documentation

## 2018-02-03 DIAGNOSIS — E119 Type 2 diabetes mellitus without complications: Secondary | ICD-10-CM | POA: Diagnosis not present

## 2018-02-03 DIAGNOSIS — M79671 Pain in right foot: Secondary | ICD-10-CM

## 2018-02-03 DIAGNOSIS — G8250 Quadriplegia, unspecified: Secondary | ICD-10-CM | POA: Insufficient documentation

## 2018-02-03 DIAGNOSIS — G809 Cerebral palsy, unspecified: Secondary | ICD-10-CM | POA: Insufficient documentation

## 2018-02-03 DIAGNOSIS — R21 Rash and other nonspecific skin eruption: Secondary | ICD-10-CM | POA: Insufficient documentation

## 2018-02-03 DIAGNOSIS — Z79899 Other long term (current) drug therapy: Secondary | ICD-10-CM | POA: Diagnosis not present

## 2018-02-03 DIAGNOSIS — Z87891 Personal history of nicotine dependence: Secondary | ICD-10-CM | POA: Diagnosis not present

## 2018-02-03 DIAGNOSIS — I110 Hypertensive heart disease with heart failure: Secondary | ICD-10-CM | POA: Insufficient documentation

## 2018-02-03 MED ORDER — ACETAMINOPHEN 500 MG PO TABS
1000.0000 mg | ORAL_TABLET | Freq: Once | ORAL | Status: AC
  Administered 2018-02-03: 1000 mg via ORAL
  Filled 2018-02-03: qty 2

## 2018-02-03 MED ORDER — NAPROXEN 250 MG PO TABS
500.0000 mg | ORAL_TABLET | Freq: Once | ORAL | Status: AC
  Administered 2018-02-03: 500 mg via ORAL
  Filled 2018-02-03: qty 2

## 2018-02-03 NOTE — Discharge Instructions (Signed)
You were seen in the ER for right foot pain.  Your x-ray showed mild soft tissue swelling.  Take 1000 mg of acetaminophen every 6 hours.  Make sure you wear supportive shoes and place some padding on the foot rest of your wheelchair.  Call Dr. Adah Perl and follow-up in a week.  Return to the ER if there is redness, warmth, fevers, calf pain or swelling.

## 2018-02-03 NOTE — ED Triage Notes (Signed)
Pt arrives to ED from home with complaints of right foot and right ankle pain for three weeks. EMS reports pt has had hx of cellulitis and right leg is more swollen than the left. Pt placed in position of comfort with bed locked and lowered, call bell in reach.

## 2018-02-03 NOTE — ED Provider Notes (Addendum)
[facility] EMERGENCY DEPARTMENT Provider Note   CSN: [id number] Arrival date & time: 02/03/18  1642     History   Chief Complaint Chief Complaint  Patient presents with  . Foot Pain    HPI Thomas Ramos is a 47 y.o. male with history of cerebral palsy, wheelchair-bound, cellulitis is here for evaluation of right foot pain.  Onset 3 to 4 weeks ago.  Gradually worsening.  Moderate.  Pain is located to the midfoot and the lateral ankle and heel.  It is worse when he puts his foot down against the the foot rest of the wheelchair and with palpation.  States he has had this issue for weeks and he went to see his podiatrist Dr. Adah Perl last month who recommended a boot but this has not been helping.  He wore the boot for "a few days" but stopped because it was not helping the pain.  He reports any direct trauma or falls.  He denies any fevers, chills, warmth, loss of sensation, paresthesias to his extremities.  He noted red rash to the tip/fib that he is states is not new.  No recent joint injections.  No IV drug use.  No alleviating factors.  He denies any leg swelling or calf pain.  No previous history of blood clots.  HPI  Past Medical History:  Diagnosis Date  . Anxiety   . Cellulitis 08/04/2017  . Cerebral palsy ([place])   . Chronic diastolic (congestive) heart failure ([place])   . CP (cerebral palsy) ([place])   . Diabetes mellitus without complication (HCC)    borderline  . Environmental allergies    takes inhalers if needed  . Esophageal stricture   . GERD (gastroesophageal reflux disease)   . Hypertension   . Motility disorder, esophageal   . Pneumonia   . 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 S/P Botox injection    approx every 4 months  . Seasonal allergies     Patient Active Problem List   Diagnosis Date Noted  . Hiatal hernia   . Cellulitis of right lower extremity without foot 08/03/2017  . Type 2 diabetes mellitus with complication, without  long-term current use of insulin ([place]) 08/03/2017  . PNA (pneumonia) 05/07/2017  . SIRS (systemic inflammatory response syndrome) (HCC)   . Acute respiratory failure with hypoxia ([place]) 03/13/2017  . 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 Rhinovirus infection 08/17/2016  . Pressure injury of skin 08/16/2016  . Acute respiratory distress 08/15/2016  . Tachycardia 08/15/2016  . Sepsis ([place]) 08/15/2016  . Onychomycosis 02/20/2016  . Essential hypertension   . OSA (obstructive sleep apnea) 12/11/2015  . Chronic diastolic (congestive) heart failure ([place])   . Chronic venous insufficiency   . Depression with anxiety 09/20/2015  . CAP (community acquired pneumonia) 09/20/2015  . Hypersomnia 07/16/2015  . Asthma exacerbation 07/16/2015  . GERD (gastroesophageal reflux disease) 07/16/2015  . Hematemesis with nausea   . Fever 05/24/2014  . Dysphagia 09/16/2010  . 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 BOTOX INJECTION N/A 12/21/2012   Procedure: BOTOX INJECTION;  Surgeon: Inda Castle, MD;  [facility];  Service: Endoscopy;  Laterality: N/A;  . BOTOX INJECTION N/A 06/28/2014   Procedure: BOTOX INJECTION;  Surgeon: Inda Castle, MD;  Location: [facility];  Service: Endoscopy;  Laterality: N/A;  . ESOPHAGOGASTRODUODENOSCOPY N/A 12/21/2012   Procedure: ESOPHAGOGASTRODUODENOSCOPY (EGD);  Surgeon: Inda Castle, MD;  [facility];  Service: Endoscopy;  Laterality: N/A;  . ESOPHAGOGASTRODUODENOSCOPY N/A 06/28/2014   Procedure: ESOPHAGOGASTRODUODENOSCOPY (EGD);  Surgeon: Inda Castle, MD;  Location: [facility];  Service: Endoscopy;  Laterality: N/A;  . ESOPHAGOGASTRODUODENOSCOPY (EGD) WITH PROPOFOL N/A 09/03/2017   Procedure: ESOPHAGOGASTRODUODENOSCOPY (EGD) WITH PROPOFOL;  Surgeon: Doran Stabler, MD;  [facility];  Service: Gastroenterology;  Laterality: N/A;  . ESOPHAGUS SURGERY      stretched esophagus  . legs    . MOUTH SURGERY    . TENDON RELEASE          Home Medications    Prior to Admission medications   Medication Sig Start Date End Date Taking? Authorizing Provider  azithromycin (ZITHROMAX) 250 MG tablet Take 1 tablet (250 mg total) by mouth daily. Take 1 tablet every day starting on Saturday until finished. 09/17/17   Montine Circle, PA-C  budesonide-formoterol (SYMBICORT) 80-4.5 MCG/ACT inhaler Inhale 2 puffs into the lungs 2 (two) times daily.    [provider]  [employer] CLOTRIMAZOLE 1 % cream Apply 1 application topically 2 (two) times daily as needed (rash). BETWEEN TOES 08/19/17   [provider]  furosemide (LASIX) 20 MG tablet Take 20 mg by mouth daily. 07/15/17   [provider]  latanoprost (XALATAN) 0.005 % ophthalmic solution Place 1 drop into both eyes daily as needed (dry eyes).     [provider]  metFORMIN (GLUCOPHAGE) 500 MG tablet Take 1,000 mg by mouth at bedtime.     [provider]  montelukast (SINGULAIR) 10 MG tablet Take 10 mg by mouth daily.  08/12/16   [provider]  mupirocin ointment (BACTROBAN) 2 % Apply to affected as needed for rash 04/30/14   [provider]  naproxen sodium (ALEVE) 220 MG tablet Take 220-440 mg by mouth 2 (two) times daily as needed (for pain).    [provider]  nitroGLYCERIN (NITROSTAT) 0.4 MG SL tablet Place 0.4 mg under the tongue every 5 (five) minutes as needed for chest pain.     [provider]  ondansetron (ZOFRAN) 4 MG tablet Take 1 tablet (4 mg total) by mouth every 6 (six) hours. Patient taking differently: Take 4 mg by mouth every 6 (six) hours as needed for nausea or vomiting.  02/05/17   Orpah Greek, MD  potassium chloride (K-DUR) 10 MEQ tablet Take 10 mEq by mouth daily.    [provider]  rosuvastatin (CRESTOR) 5 MG tablet Take 5 mg by mouth at bedtime.    [provider]    Family  History Family History  Problem Relation Age of Onset  . Hypertension Father   . Lung cancer Father   . Diabetes Mother     Social History Social History   Tobacco Use  . Smoking status: Never Smoker  . Smokeless tobacco: Former Systems developer  . Tobacco comment: smoked one time  Substance Use Topics  . Alcohol use: Yes    Comment: 2 beers every other Friday and Saturday  . Drug use: No     Allergies   Penicillins; Sulfamethoxazole-trimethoprim; and Metronidazole   Review of Systems Review of Systems  Musculoskeletal: Positive for arthralgias.  Skin: Positive for rash.  All other systems reviewed and are negative.    Physical Exam Updated Vital Signs BP 106/84   Pulse (!) 104   Temp 98.2 F (36.8 C) (Oral)   Resp (!) 28   Wt 73.9 kg   SpO2 95%   BMI  27.11 kg/m   Physical Exam  Constitutional: He is oriented to person, place, and time. He appears well-developed and well-nourished. No distress.  NAD.  HENT:  Head: Normocephalic and atraumatic.  Right Ear: External ear normal.  Left Ear: External ear normal.  Nose: Nose normal.  Eyes: Conjunctivae and EOM are normal. No scleral icterus.  Neck: Normal range of motion. Neck supple.  Cardiovascular: Normal rate, regular rhythm and normal heart sounds.  Pulmonary/Chest: Effort normal and breath sounds normal.  Musculoskeletal: Normal range of motion. He exhibits no deformity.  Right foot: Mild tenderness to the dorsal midfoot and lateral malleolus and calcaneus.  Significant loss of range of motion, patient states he usually cannot move his ankle.  Moderate edema to the dorsal midfoot but no erythema, warmth, fluctuance or streaking of redness up the leg.  No focal bony tenderness to the toes, medial malleolus, Achilles, calf.  Neurological: He is alert and oriented to person, place, and time.  Skin: Skin is warm and dry. Capillary refill takes less than 2 seconds. Rash noted.  Nontender, dark red, blanchable rash to the  mid tib-fib without warmth.  Psychiatric: He has a normal mood and affect. His behavior is normal. Judgment and thought content normal.  Nursing note and vitals reviewed.      ED Treatments / Results  Labs (all labs ordered are listed, but only abnormal results are displayed) Labs Reviewed - No data to display  EKG None  Radiology Dg Ankle 2 Views Right  Result Date: 02/03/2018 CLINICAL DATA:  Pt arrives to ED from home with complaints of right foot and right ankle pain for three weeks. EMS reports pt has had hx of cellulitis and right leg is more swollen than the left. Pt placed in position of comfort with bed locked .*comment was truncated*worsening right mid foot and ankle pain; h/o cerebral palsy wheelchair bound EXAM: RIGHT ANKLE - 2 VIEW COMPARISON:  None. FINDINGS: Ankle mortise intact. The talar dome is normal. No malleolar fracture. The calcaneus is normal. AP view is somewhat obscured by soft tissue prominence over the dorsum of the foot. IMPRESSION: No fracture or dislocation. Electronically Signed   By: Suzy Bouchard M.D.   On: 02/03/2018 19:24   Dg Foot 2 Views Right  Result Date: 02/03/2018 CLINICAL DATA:  Pt arrives to ED from home with complaints of right foot and right ankle pain for three weeks. EMS reports pt has had hx of cellulitis and right leg is more swollen than the left. Pt placed in position of comfort with bed locked .*comment was truncated*worsening right mid foot and ankle pain; h/o cerebral palsy wheelchair bound EXAM: RIGHT FOOT - 2 VIEW COMPARISON:  12/31/2017 FINDINGS: No fracture or dislocation of mid foot or forefoot. The phalanges are normal. The calcaneus is normal. Pes planus deformity Swelling of the dorsum of the foot.  Osteopenia. IMPRESSION: No fracture dislocation. Soft tissue swelling. Osteopenia. Electronically Signed   By: Suzy Bouchard M.D.   On: 02/03/2018 19:23    Procedures Procedures (including critical care time)  Medications  Ordered in ED Medications  acetaminophen (TYLENOL) tablet 1,000 mg (1,000 mg Oral Given 02/03/18 1738)  naproxen (NAPROSYN) tablet 500 mg (500 mg Oral Given 02/03/18 1735)     Initial Impression / Assessment and Plan / ED Course  I have reviewed the triage vital signs and the nursing notes.  Pertinent labs & imaging results that were available during my care of the patient were reviewed by me and  considered in my medical decision making (see chart for details).    47 year old male with history of cerebral palsy wheelchair-bound is here for atraumatic right foot pain.  Has been evaluated by podiatry for this but pain has persisted.  He stopped wearing the recommended boot and states that he has not received at the padding for the foot rest of his wheelchair yet.  Chart review shows that patient was seen by Dr. Adah Perl for the right foot pain last month.  She recommended a pillow for the right foot to rest on while on the wheelchair but patient states he has not been using this.  There is a rash to the mid tib-fib that is not consistent with cellulitis, it is not warm or tender.  He has had no fevers.  He does not have any asymmetric calf swelling or tenderness and I doubt DVT.  X-rays show midfoot soft tissue swelling that appears to be chronic for him.  No acute bony injuries.  He has osteopenia.  I do not think there is further emergent need for labs or imaging.  I have recommended he take 1 g of Tylenol every 6 hours.  Encouraged him to use the padding given to him when he is on his wheelchair and follow-up with podiatry in 1 week for further discussion of this.  Discussed specific return precautions that would suggest infectious process or blood clot.  He is in agreement.  Final Clinical Impressions(s) / ED Diagnoses   Final diagnoses:  Foot pain, right    ED Discharge Orders    None       Arlean Hopping 02/03/18 1953    Davonna Belling, MD 02/03/18 2316

## 2018-02-03 NOTE — ED Notes (Signed)
Patient verbalizes understanding of discharge instructions. Opportunity for questioning and answers were provided. Armband removed by staff, pt discharged from ED via stretcher w/ PTAR

## 2018-02-09 ENCOUNTER — Ambulatory Visit: Payer: Medicare Other | Admitting: Podiatry

## 2018-02-14 ENCOUNTER — Ambulatory Visit: Payer: Medicare Other | Attending: Family Medicine | Admitting: Physical Therapy

## 2018-02-16 ENCOUNTER — Ambulatory Visit: Payer: Medicare Other | Admitting: Podiatry

## 2018-02-21 ENCOUNTER — Ambulatory Visit: Payer: Medicare Other | Admitting: Physical Therapy

## 2018-03-04 ENCOUNTER — Ambulatory Visit: Payer: Medicare Other | Admitting: Physical Therapy

## 2018-03-07 ENCOUNTER — Ambulatory Visit: Payer: Medicare Other | Admitting: Podiatry

## 2018-03-17 ENCOUNTER — Telehealth: Payer: Self-pay | Admitting: *Deleted

## 2018-03-17 ENCOUNTER — Ambulatory Visit: Payer: Medicare Other | Admitting: Podiatry

## 2018-03-17 NOTE — Telephone Encounter (Signed)
Dr. Elisha Ponder ordered Long Prevalon boot. [facility] states can special order. I faxed order to [facility].

## 2018-03-23 ENCOUNTER — Ambulatory Visit: Payer: Medicare Other | Admitting: Podiatry

## 2018-03-28 ENCOUNTER — Ambulatory Visit: Payer: Medicare Other | Attending: Family Medicine | Admitting: Physical Therapy

## 2018-03-28 ENCOUNTER — Encounter: Payer: Self-pay | Admitting: Physical Therapy

## 2018-03-28 DIAGNOSIS — G800 Spastic quadriplegic cerebral palsy: Secondary | ICD-10-CM | POA: Diagnosis present

## 2018-03-28 DIAGNOSIS — R29898 Other symptoms and signs involving the musculoskeletal system: Secondary | ICD-10-CM | POA: Diagnosis present

## 2018-03-28 NOTE — Patient Instructions (Signed)
Access Code: [email or web address]  URL: [email or web address]  Date: 03/28/2018  Prepared by: Barry Brunner   Exercises  Supine Hip and Knee Flexion PROM with Caregiver - 2 reps - 1 sets - 30 hold - 1-2x daily - 5x weekly  Supine Lower Trunk Rotation - 2 reps - 1 sets - 30 hold - 1-2x daily - 7x weekly

## 2018-03-28 NOTE — Therapy (Signed)
[facility] [address] [place], [place], [address] Phone: [phone]   Fax:  [phone]  Physical Therapy Treatment  Patient Details  Name: Thomas Ramos MRN: [id number] Date of Birth: September 24, 1970 Referring Provider (PT): Aura Dials, [place]   Encounter Date: 03/28/2018  PT End of Session - 03/28/18 1511    Visit Number  1    Number of Visits  4   includes eval + 3 treatment visits   Date for PT Re-Evaluation  03/02/18    Authorization Type  Medicare/Medicaid  01/31/18 to 04/01/2018    Authorization Time Period  01/31/18 to 04/01/2018    Authorization - Visit Number  1    Authorization - Number of Visits  3    PT Start Time  1402    PT Stop Time  1505    PT Time Calculation (min)  63 min    Activity Tolerance  Patient tolerated treatment well    Behavior During Therapy  [facility] for tasks assessed/performed       Past Medical History:  Diagnosis Date  . Anxiety   . Cellulitis 08/04/2017  . Cerebral palsy ([place])   . Chronic diastolic (congestive) heart failure ([place])   . CP (cerebral palsy) ([place])   . Diabetes mellitus without complication (HCC)    borderline  . Environmental allergies    takes inhalers if needed  . Esophageal stricture   . GERD (gastroesophageal reflux disease)   . Hypertension   . Motility disorder, esophageal   . Pneumonia   . 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 S/P Botox injection    approx every 4 months  . Seasonal allergies     Past Surgical History:  Procedure Laterality Date  . BOTOX INJECTION N/A 12/21/2012   Procedure: BOTOX INJECTION;  Surgeon: Inda Castle, MD;  Location: [facility];  Service: Endoscopy;  Laterality: N/A;  . BOTOX INJECTION N/A 06/28/2014   Procedure: BOTOX INJECTION;  Surgeon: Inda Castle, MD;  Location: [facility];  Service: Endoscopy;  Laterality: N/A;  . ESOPHAGOGASTRODUODENOSCOPY N/A 12/21/2012   Procedure: ESOPHAGOGASTRODUODENOSCOPY (EGD);   Surgeon: Inda Castle, MD;  Location: [facility];  Service: Endoscopy;  Laterality: N/A;  . ESOPHAGOGASTRODUODENOSCOPY N/A 06/28/2014   Procedure: ESOPHAGOGASTRODUODENOSCOPY (EGD);  Surgeon: Inda Castle, MD;  Location: [facility];  Service: Endoscopy;  Laterality: N/A;  . ESOPHAGOGASTRODUODENOSCOPY (EGD) WITH PROPOFOL N/A 09/03/2017   Procedure: ESOPHAGOGASTRODUODENOSCOPY (EGD) WITH PROPOFOL;  Surgeon: Doran Stabler, MD;  [facility]: [facility];  Service: Gastroenterology;  Laterality: N/A;  . ESOPHAGUS SURGERY     stretched esophagus  . legs    . MOUTH SURGERY    . TENDON RELEASE      There were no vitals filed for this visit.  Subjective Assessment - 03/28/18 1413    Subjective  States he will let us hoyer him out of his chair for stretching/ROM but he would rather not.     Patient is accompained by:  --   caregiver, Lulu   Pertinent History  spastic CP    Patient Stated Goals  be more comfortable in his wheelchair and not slide down in chair    Currently in Pain?  No/denies        Treatment- Educated on rationale to [employer] to mat table (pt fearful and resisting doing this do to a fall from a hoyer lift) and need to further assess his ROM to be able to achieve his goals of  being more comfortable in his chair and not slide down so much. Ultimately he agreed.   Pt able to lie supine with 1 pillow with head in neutral and shoulders in contact with the bed. Bil LEs held in flexion (hips and knees) and noted pt with limited degree of hip flexion. Limited hip flexion (left does not go to 90 degrees for upright sitting) is certainly contributing to his sliding down in his chair. Performed supine PROM hip flexion stretch x 3 x 30 seconds each. Able to feel muscles begin to relax and allow incremental increase in hip flexion.    For stretching/lengthening of rt torso, had pt roll onto his left side (mod assist) and then allow his upper body to roll back towards the bed while his  legs and lower body were held in place. Also tried from hooklying, keeping shoulders on the bed and rolling legs over to the left (he did not feel this stretch as much). Performed total of 3 stretches x 30 sec. Performed hooklying with legs rotated over to his right for left torso stretch and pt had good ROM with no issues.   Educated pt and caregiver on best times to stretch and ideally will do stretches 2x/day. Patient inquiring when he can return (he missed 2 other scheduled appts) and agreed to return in 4 weeks for assessment of progress and updating HEP or further instructing his caregiver. (Pt reported he has too many appts in upcoming weeks and 4 weeks would be best).                       PT Education - 03/28/18 1510    Education Details  plan for today (he had previously agreed to [employer] out of chair); need for 2 basic stretches to start with, staff to help him ideally after shower at night and again in the morning    Person(s) Educated  Patient;Caregiver(s)   Lulu   Methods  Explanation;Demonstration;Handout;Verbal cues    Comprehension  Verbalized understanding;Verbal cues required;Need further instruction          PT Long Term Goals - 03/28/18 1520      PT LONG TERM GOAL #1   Title  Patient will be able to direct caregivers in [place] to improve sitting position and sitting tolerance in w/c (Target all LTGs 03/04/2018)    Baseline  03/28/18 pt has returned to clinic for first time since evaluation; goal still appropriate    Time  4    Period  Weeks    Status  On-going      PT LONG TERM GOAL #2   Title  Complete bracing assessment vs need for foot rest modifications for improved positioning of RLE in wheelchair    Baseline  03/28/18 not having pain in his foot anymore; no further needs    Time  4    Period  Weeks    Status  Deferred      PT LONG TERM GOAL #3   Title  Right knee PROM into knee extension will improve to -70 degrees.      Baseline  03/28/18 no change as education with caregiver only initiated today on first visit    Time  4    Period  Weeks    Status  On-going      PT LONG TERM GOAL #4   Title  Patient will achieve 100 degrees of rt hip flexion and 90 degrees of left hip flexion  Baseline  03/28/18 rt hip 90 degrees; left 80 degrees    Time  4    Period  Weeks    Status  New            Plan - 03/28/18 1513    Clinical Impression Statement  Patient lifted via hoyer and 2 person assist from power chair to mat table. Further ROM assessment made as pt was not able to be lifted out of his chair during evaluation (had no hoyer pad under him). Pt with limited hip flexion to 90 rt hip and 80 on left hip. Pt with tight torso with shortening on his right (he leans to the right in his chair). Educated pt/caregiver on importance of stretches in helping him be more comfortable and slide down in his chair less. Unfortunately the caregiver present is not the person who helps him shower and get into bed. That person's shift is 4:00-7:00 pm. Will plan to see pt back in 2-4 weeks to assess progress with stretches and see if further instruction or updates to HEP is needed. (Patient reports multiple upcoming appts and not sure he can return in 2 weeks, therefore given an appt in 4 weeks).     Rehab Potential  Fair    Clinical Impairments Affecting Rehab Potential  expected progression of patient due to chronicity of muscle contractures; home situation (?support/carryover of plan with his multiple caregivers)    PT Frequency  [place]    PT Duration  4 weeks    PT Treatment/Interventions  ADLs/Self Care Home Management;Patient/family education;Therapeutic exercise;Neuromuscular re-education;Orthotic Fit/Training;Manual techniques;Passive range of motion    PT Next Visit Plan  has he been doing stretches and how is it going?; hoyer out of w/c to hi/lo table; assess need to update any stretches and complete caregiver education on  PROM/positioining for bil LEs and torso    Consulted and Agree with Plan of Care  Patient;Other (Comment)   caregiver, Lulu      Patient will benefit from skilled therapeutic intervention in order to improve the following deficits and impairments:  Impaired tone, Hypomobility, Decreased strength, Postural dysfunction, Impaired sensation, Decreased balance, Impaired flexibility, Decreased range of motion, Decreased activity tolerance, Increased fascial restricitons, Pain  Visit Diagnosis: Spastic quadriplegic cerebral palsy (HCC)  Other symptoms and signs involving the musculoskeletal system     Problem List Patient Active Problem List   Diagnosis Date Noted  . Hiatal hernia   . Cellulitis of right lower extremity without foot 08/03/2017  . Type 2 diabetes mellitus with complication, without long-term current use of insulin ([place]) 08/03/2017  . PNA (pneumonia) 05/07/2017  . SIRS (systemic inflammatory response syndrome) (HCC)   . Acute respiratory failure with hypoxia ([place]) 03/13/2017  . 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 Rhinovirus infection 08/17/2016  . Pressure injury of skin 08/16/2016  . Acute respiratory distress 08/15/2016  . Tachycardia 08/15/2016  . Sepsis ([place]) 08/15/2016  . Onychomycosis 02/20/2016  . Essential hypertension   . OSA (obstructive sleep apnea) 12/11/2015  . Chronic diastolic (congestive) heart failure ([place])   . Chronic venous insufficiency   . Depression with anxiety 09/20/2015  . CAP (community acquired pneumonia) 09/20/2015  . Hypersomnia 07/16/2015  . Asthma exacerbation 07/16/2015  . GERD (gastroesophageal reflux disease) 07/16/2015  . Hematemesis with nausea   . Fever 05/24/2014  . Dysphagia 09/16/2010  . 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

## 2018-04-22 ENCOUNTER — Ambulatory Visit: Payer: Medicare Other | Admitting: Physical Therapy

## 2018-04-26 ENCOUNTER — Ambulatory Visit: Payer: Medicare Other | Admitting: Podiatry

## 2018-05-27 ENCOUNTER — Ambulatory Visit: Payer: Medicare Other | Admitting: Podiatry

## 2018-06-01 ENCOUNTER — Ambulatory Visit: Payer: Medicare Other | Admitting: Podiatry

## 2018-06-15 ENCOUNTER — Ambulatory Visit: Payer: Medicare Other | Admitting: Podiatry

## 2018-07-21 ENCOUNTER — Ambulatory Visit: Payer: Medicare Other | Admitting: Podiatry

## 2018-08-19 ENCOUNTER — Ambulatory Visit: Payer: Medicare Other | Attending: Physician Assistant | Admitting: Rehabilitative and Restorative Service Providers"

## 2018-08-19 ENCOUNTER — Encounter: Payer: Self-pay | Admitting: Rehabilitative and Restorative Service Providers"

## 2018-08-19 ENCOUNTER — Other Ambulatory Visit: Payer: Self-pay

## 2018-08-19 DIAGNOSIS — R29898 Other symptoms and signs involving the musculoskeletal system: Secondary | ICD-10-CM | POA: Diagnosis present

## 2018-08-19 DIAGNOSIS — G800 Spastic quadriplegic cerebral palsy: Secondary | ICD-10-CM | POA: Diagnosis not present

## 2018-08-19 NOTE — Patient Instructions (Signed)
Access Code: [email or web address]  URL: [email or web address]  Date: 08/19/2018  Prepared by: Rudell Cobb   Exercises Supine Hip and Knee Flexion PROM with Caregiver - 3-5 reps - 1 sets - 30 hold - 1-2x daily - 5x weekly Supine Lower Trunk Rotation - 3-5 reps - 1 sets - 30 hold - 1-2x daily - 7x weekly Butterfly Stretch with Caregiver - 3-5 reps - 1 sets - 30 second hold - 1-2x daily - 7x weekly Shoulder Flexion Caregiver PROM - 3-5 reps - 1 sets - 1-2x daily - 7x weekly

## 2018-08-19 NOTE — Therapy (Signed)
[facility] [address] [place], [place], [address] Phone: [phone]   Fax:  [phone]  Physical Therapy Treatment and Discharge Summary  Patient Details  Name: Thomas Ramos MRN: [id number] Date of Birth: 06/02/1970 Referring Provider (PT): [name]  *Patient has not been seen since January.  He was due to return, however clinic had significantly reduced hours due to pandemic.  Updated home program and d/c today.  Encounter Date: 08/19/2018  PT End of Session - 08/19/18 1501    Visit Number  2    Number of Visits  4   includes eval + 3 treatment visits   Authorization Type  Medicare/Medicaid d/c today    Authorization Time Period  01/31/18 to 04/01/2018    Authorization - Visit Number  2    Authorization - Number of Visits  3    PT Start Time  2297    PT Stop Time  1448    PT Time Calculation (min)  45 min    Activity Tolerance  Patient tolerated treatment well    Behavior During Therapy  [facility] for tasks assessed/performed       Past Medical History:  Diagnosis Date  . Anxiety   . Cellulitis 08/04/2017  . Cerebral palsy ([place])   . Chronic diastolic (congestive) heart failure ([place])   . CP (cerebral palsy) ([place])   . Diabetes mellitus without complication (HCC)    borderline  . Environmental allergies    takes inhalers if needed  . Esophageal stricture   . GERD (gastroesophageal reflux disease)   . Hypertension   . Motility disorder, esophageal   . Pneumonia   . 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 S/P Botox injection    approx every 4 months  . Seasonal allergies     Past Surgical History:  Procedure Laterality Date  . BOTOX INJECTION N/A 12/21/2012   Procedure: BOTOX INJECTION;  Surgeon: Inda Castle, MD;  [facility];  Service: Endoscopy;  Laterality: N/A;  . BOTOX INJECTION N/A 06/28/2014   Procedure: BOTOX INJECTION;  Surgeon: Inda Castle, MD;  Location: [facility];  Service: Endoscopy;  Laterality: N/A;  . ESOPHAGOGASTRODUODENOSCOPY N/A 12/21/2012   Procedure: ESOPHAGOGASTRODUODENOSCOPY (EGD);  Surgeon: Inda Castle, MD;  Location: [facility];  Service: Endoscopy;  Laterality: N/A;  . ESOPHAGOGASTRODUODENOSCOPY N/A 06/28/2014   Procedure: ESOPHAGOGASTRODUODENOSCOPY (EGD);  Surgeon: Inda Castle, MD;  Location: [facility];  Service: Endoscopy;  Laterality: N/A;  . ESOPHAGOGASTRODUODENOSCOPY (EGD) WITH PROPOFOL N/A 09/03/2017   Procedure: ESOPHAGOGASTRODUODENOSCOPY (EGD) WITH PROPOFOL;  Surgeon: Doran Stabler, MD;  [facility];  Service: Gastroenterology;  Laterality: N/A;  . ESOPHAGUS SURGERY     stretched esophagus  . legs    . MOUTH SURGERY    . TENDON RELEASE      There were no vitals filed for this visit.  Subjective Assessment - 08/19/18 1403    Subjective  The patient reports he got his wheelchair modifications performed.  He notes he is very particular and doesn't want to do stretching that will hurt him.  We discussed his initial goal stated for therapy regarding not sliding in w/c and he feels this goal was met by modifications made to power w/c.    Pertinent History  spastic CP    Patient Stated Goals  be more comfortable in his wheelchair and not slide down in chair    Currently in Pain?  No/denies  CLINIC OPERATION CHANGES: [facility] is open at lower capacity following universal masking, social distancing, and patient screening.  The patient's COVID risk of complications score is 5.                  [facility] Adult PT Treatment/Exercise - 08/19/18 1502      Transfers   Comments  Hoyer lift +2 assist w/c<>mat with increased time to figure out hoyer from group home connections to our lift.  Patient tolerated lift well.       Exercises   Exercises  Other Exercises    Other Exercises   Reviewed 2 prior ther ex consisting of trunk rotation and knee to chest PROM.  Added  butterfly stretch and UE PROM R side.  Attempted HS stretch, however knee joint appears more fused with hard end feel therefore did not give as HEP.               PT Education - 08/19/18 1449    Education Details  Added HEP for further caregiver AROM          PT Long Term Goals - 08/19/18 1659      PT LONG TERM GOAL #1   Title  Patient will be able to direct caregivers in [place] HEP to improve sitting position and sitting tolerance in w/c (Target all LTGs 03/04/2018)    Baseline  03/28/18 pt has returned to clinic for first time since evaluation; goal still appropriate    Time  4    Period  Weeks    Status  Achieved      PT LONG TERM GOAL #2   Title  Complete bracing assessment vs need for foot rest modifications for improved positioning of RLE in wheelchair    Baseline  03/28/18 not having pain in his foot anymore; no further needs    Time  4    Period  Weeks    Status  Deferred      PT LONG TERM GOAL #3   Title  Right knee PROM into knee extension will improve to -70 degrees.     Baseline  03/28/18 no change as education with caregiver only initiated today on first visit    Time  4    Period  Weeks    Status  Deferred      PT LONG TERM GOAL #4   Title  Patient will achieve 100 degrees of rt hip flexion and 90 degrees of left hip flexion    Baseline  03/28/18 rt hip 90 degrees; left 80 degrees    Time  4    Period  Weeks    Status  Deferred            Plan - 08/19/18 1504    Clinical Impression Statement  Patient met his goal of attending therapy for positioning in w/c.  He is willing to do home stretching with caregiver assist and caregiver supervisor attended session.  WE updated home program and recommend continuation of activities post d/c today.    PT Treatment/Interventions  ADLs/Self Care Home Management;Patient/family education;Therapeutic exercise;Neuromuscular re-education;Orthotic Fit/Training;Manual techniques;Passive range of motion    PT  Next Visit Plan  Discharge from PT    Consulted and Agree with Plan of Care  Patient;Other (Comment)   Lulu from group hom, supervisor who will educate other caregivers      Patient will benefit from skilled therapeutic intervention in order to improve the following deficits and impairments:  Impaired tone,  Hypomobility, Decreased strength, Postural dysfunction, Impaired sensation, Decreased balance, Impaired flexibility, Decreased range of motion, Decreased activity tolerance, Increased fascial restricitons, Pain  Visit Diagnosis: Spastic quadriplegic cerebral palsy (HCC)  Other symptoms and signs involving the musculoskeletal system  PHYSICAL THERAPY DISCHARGE SUMMARY  Visits from Start of Care: 2  Current functional level related to goals / functional outcomes: See above   Remaining deficits: Patient continues with chronic deficits with muscle tone, spasticity, joint contracture.  PT focus has been on education of caregivers for continued stretching and patient got updated seated in w/c.  PT to d/c with home stretching.   Education / Equipment: Home program education provided to caregiver at group home.  Plan: Patient agrees to discharge.  Patient goals were partially met. Patient is being discharged due to meeting the stated rehab goals.  ?????          Thank you for the referral of this patient. Rudell Cobb, MPT     [place], PT 08/19/2018, 4:59 PM  [facility] [address] [place], [place], [address] Phone: [phone]   Fax:  [phone]  Name: Thomas Ramos MRN: [id number] Date of Birth: [birth year]

## 2018-09-12 ENCOUNTER — Other Ambulatory Visit: Payer: Self-pay

## 2018-09-12 ENCOUNTER — Encounter: Payer: Self-pay | Admitting: Podiatry

## 2018-09-12 ENCOUNTER — Ambulatory Visit (INDEPENDENT_AMBULATORY_CARE_PROVIDER_SITE_OTHER): Payer: Medicare Other | Admitting: Podiatry

## 2018-09-12 VITALS — Temp 97.9°F

## 2018-09-12 DIAGNOSIS — M79675 Pain in left toe(s): Secondary | ICD-10-CM

## 2018-09-12 DIAGNOSIS — M79674 Pain in right toe(s): Secondary | ICD-10-CM | POA: Diagnosis not present

## 2018-09-12 DIAGNOSIS — B351 Tinea unguium: Secondary | ICD-10-CM | POA: Diagnosis not present

## 2018-09-12 NOTE — Patient Instructions (Signed)

## 2018-09-17 NOTE — Progress Notes (Signed)
Subjective:  Thomas Ramos presents to clinic today with cc of  painful, thick, discolored, elongated toenails 1-5 b/l that become tender and cannot cut because of thickness. Pain is aggravated when wearing enclosed shoe gear and relieved with periodic professional debridement.  Caregiver relates issues with his leg pain have resolved after having his motorized chair modified.  Chesley Noon, MD is his PCP.   Current Outpatient Medications:  .  azithromycin (ZITHROMAX) 250 MG tablet, Take 1 tablet (250 mg total) by mouth daily. Take 1 tablet every day starting on Saturday until finished., Disp: 6 tablet, Rfl: 0 .  baclofen (LIORESAL) 10 MG tablet, Take 1/2 tablet (5 mg dose) by MOUTH AT bedtime., Disp: , Rfl:  .  budesonide-formoterol (SYMBICORT) 80-4.5 MCG/ACT inhaler, Inhale 2 puffs into the lungs 2 (two) times daily., Disp: , Rfl:  .  [employer] CLOTRIMAZOLE 1 % cream, Apply 1 application topically 2 (two) times daily as needed (rash). BETWEEN TOES, Disp: , Rfl: 1 .  furosemide (LASIX) 20 MG tablet, Take 20 mg by mouth daily., Disp: , Rfl: 1 .  latanoprost (XALATAN) 0.005 % ophthalmic solution, Place 1 drop into both eyes daily as needed (dry eyes). , Disp: , Rfl:  .  metFORMIN (GLUCOPHAGE) 500 MG tablet, Take 1,000 mg by mouth at bedtime. , Disp: , Rfl:  .  montelukast (SINGULAIR) 10 MG tablet, Take 10 mg by mouth daily. , Disp: , Rfl:  .  mupirocin ointment (BACTROBAN) 2 %, Apply to affected as needed for rash, Disp: , Rfl:  .  naproxen sodium (ALEVE) 220 MG tablet, Take 220-440 mg by mouth 2 (two) times daily as needed (for pain)., Disp: , Rfl:  .  nitroGLYCERIN (NITROSTAT) 0.4 MG SL tablet, Place 0.4 mg under the tongue every 5 (five) minutes as needed for chest pain. , Disp: , Rfl:  .  ondansetron (ZOFRAN) 4 MG tablet, Take 1 tablet (4 mg total) by mouth every 6 (six) hours. (Patient taking differently: Take 4 mg by mouth every 6 (six) hours as needed for nausea or vomiting. ), Disp: 12  tablet, Rfl: 0 .  potassium chloride (K-DUR) 10 MEQ tablet, Take 10 mEq by mouth daily., Disp: , Rfl:  .  rosuvastatin (CRESTOR) 5 MG tablet, Take 5 mg by mouth at bedtime., Disp: , Rfl:    Allergies  Allergen Reactions  . Penicillins Hives, Nausea And Vomiting and Other (See Comments)    Patient tolerated cefazolin in 2017 Has patient had a PCN reaction causing immediate rash, facial/tongue/throat swelling, SOB or lightheadedness with hypotension: Yes Has patient had a PCN reaction causing severe rash involving mucus membranes or skin necrosis: No Has patient had a PCN reaction that required hospitalization No Has patient had a PCN reaction occurring within the last 10 years: Yes If all of the above answers are "NO", then may proceed with Cephalosporin use.  . Sulfamethoxazole-Trimethoprim Nausea And Vomiting  . Metronidazole Nausea And Vomiting     Objective: Vitals:   09/12/18 1540  Temp: 97.9 F (36.6 C)    Physical Examination:  Vascular Examination: Capillary refill time <3 seconds  x 10 digits.  Palpable DP/PT pulses b/l.  Digital hair absent x 10 digits.  Chronic LE edema secondary to dependency/quadriplegia. No pain on calf compression.   Venous insufficiency BLE.   Skin temperature gradient WNL b/l.  Dermatological Examination: Skin with normal turgor, texture and tone b/l.  No open wounds b/l.  No interdigital macerations noted b/l.  Elongated, thick,  discolored brittle toenails with subungual debris and pain on dorsal palpation of nailbeds 1-5 b/l.  Musculoskeletal Examination: Paraplegia noted BLE  No pain, crepitus or joint discomfort with passive ROM.  Neurological Examination: Sensation intact 5/5 b/l with 10 gram monofilament.   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 Toenails 1-5 b/l were debrided in length and girth without iatrogenic laceration. 2.  Continue soft, supportive shoe gear daily. 3.  Report  any pedal injuries to medical professional. 4.  Follow up 3 months. 5.  Patient/POA to call should there be a question/concern in there interim.

## 2018-09-22 ENCOUNTER — Telehealth: Payer: Self-pay

## 2018-09-22 NOTE — Telephone Encounter (Signed)
Covid-19 screening questions   Do you now or have you had a fever in the last 14 days no   Do you have any respiratory symptoms of shortness of breath or cough now or in the last 14 days no  Do you have any family members or close contacts with diagnosed or suspected Covid-19 in the past 14 days no  Have you been tested for Covid-19 and found to be positive no          

## 2018-09-23 ENCOUNTER — Other Ambulatory Visit: Payer: Self-pay

## 2018-09-23 ENCOUNTER — Ambulatory Visit (INDEPENDENT_AMBULATORY_CARE_PROVIDER_SITE_OTHER): Payer: Medicare Other | Admitting: Gastroenterology

## 2018-09-23 ENCOUNTER — Encounter: Payer: Self-pay | Admitting: Gastroenterology

## 2018-09-23 VITALS — BP 132/78 | HR 76 | Temp 97.4°F

## 2018-09-23 DIAGNOSIS — K529 Noninfective gastroenteritis and colitis, unspecified: Secondary | ICD-10-CM | POA: Diagnosis not present

## 2018-09-23 DIAGNOSIS — R1310 Dysphagia, unspecified: Secondary | ICD-10-CM | POA: Diagnosis not present

## 2018-09-23 DIAGNOSIS — R1319 Other dysphagia: Secondary | ICD-10-CM

## 2018-09-23 NOTE — Patient Instructions (Addendum)
If you are age 46 or older, your body mass index should be between 23-30. Your There is no height or weight on file to calculate BMI. If this is out of the aforementioned range listed, please consider follow up with your Primary Care Provider.  If you are age 68 or younger, your body mass index should be between 19-25. Your There is no height or weight on file to calculate BMI. If this is out of the aformentioned range listed, please consider follow up with your Primary Care Provider.   To help prevent the possible spread of infection to our patients, communities, and staff; we will be implementing the following measures:  As of now we are not allowing any visitors/family members to accompany you to any upcoming appointments with [facility]. If you have any concerns about this please contact our office to discuss prior to the appointment.   It was a pleasure to see you today!  Dr. Loletha Carrow

## 2018-09-23 NOTE — Progress Notes (Signed)
[facility] Progress Note  Chief Complaint: Diarrhea  Subjective  History: Last seen for upper endoscopy June 2019 previously been seen by Dr. Deatra Ina for upper endoscopies to perform Botox for clinical impression of achalasia.  On last EGD June 2019, patient had tortuous distal esophagus and a large hiatal hernia, not amenable to endoscopic dilation or Botox injection.  He was not interested in hernia repair or PEG placement.  The procedure was largely being done out of concern on the part of his pulmonary physicians that he may have had recurrent aspiration leading to respiratory infection.  Ival was sent by his primary care provider, Dr. Anastasia Pall, regarding chronic diarrhea in the setting of metformin use.  Deundra says that he has a been on variable doses of this medicine for years, and he has been troubled by chronic diarrhea from it.  He says at a relatively low dose it does not cause diarrhea, but it recent dosing it definitely does.  When the medicine was recently discontinued, the diarrhea stopped altogether.  He was told to take Imodium until he saw [place] for this diarrhea.  He denies abdominal pain or rectal bleeding.  His dysphagia is similar to before.  He has to be careful eating, says he has not been bringing things back up regularly, and feels it is manageable.  ROS: Cardiovascular:  no chest pain Respiratory: no dyspnea No fever cough or weight change The patient's Past Medical, Family and Social History were reviewed and are on file in the EMR.  Objective:  Med list reviewed  Current Outpatient Medications:  .  baclofen (LIORESAL) 10 MG tablet, Take 1/2 tablet (5 mg dose) by MOUTH AT bedtime., Disp: , Rfl:  .  budesonide-formoterol (SYMBICORT) 80-4.5 MCG/ACT inhaler, Inhale 2 puffs into the lungs 2 (two) times daily., Disp: , Rfl:  .  [employer] CLOTRIMAZOLE 1 % cream, Apply 1 application topically 2 (two) times daily as needed (rash). BETWEEN TOES, Disp: , Rfl: 1 .   furosemide (LASIX) 20 MG tablet, Take 20 mg by mouth daily., Disp: , Rfl: 1 .  latanoprost (XALATAN) 0.005 % ophthalmic solution, Place 1 drop into both eyes daily as needed (dry eyes). , Disp: , Rfl:  .  metFORMIN (GLUCOPHAGE) 500 MG tablet, Take 1,000 mg by mouth at bedtime. , Disp: , Rfl:  .  montelukast (SINGULAIR) 10 MG tablet, Take 10 mg by mouth daily. , Disp: , Rfl:  .  mupirocin ointment (BACTROBAN) 2 %, Apply to affected as needed for rash, Disp: , Rfl:  .  naproxen sodium (ALEVE) 220 MG tablet, Take 220-440 mg by mouth 2 (two) times daily as needed (for pain)., Disp: , Rfl:  .  nitroGLYCERIN (NITROSTAT) 0.4 MG SL tablet, Place 0.4 mg under the tongue every 5 (five) minutes as needed for chest pain. , Disp: , Rfl:  .  ondansetron (ZOFRAN) 4 MG tablet, Take 1 tablet (4 mg total) by mouth every 6 (six) hours. (Patient taking differently: Take 4 mg by mouth every 6 (six) hours as needed for nausea or vomiting. ), Disp: 12 tablet, Rfl: 0 .  potassium chloride (K-DUR) 10 MEQ tablet, Take 10 mEq by mouth daily., Disp: , Rfl:  .  rosuvastatin (CRESTOR) 5 MG tablet, Take 5 mg by mouth at bedtime., Disp: , Rfl:   Patient was on the same dose of metformin at last clinic visit.  Vital signs in last 24 hrs: Vitals:   09/23/18 1334  BP: 132/78  Pulse:  76  Temp: (!) 97.4 F (36.3 C)    Physical Exam  Wheelchair confined, spasticity from CP, dysarthria  Cardiac: RRR without murmurs, S1S2 heard, no peripheral edema  Pulm: clear to auscultation bilaterally, normal RR and effort noted  Abdomen: soft, no tenderness, with active bowel sounds.  Limited exam due to body habitus and while in wheelchair   No recent data, no primary care records for review   @ASSESSMENTPLANBEGIN @ Assessment: Encounter Diagnoses  Name Primary?  . Chronic diarrhea Yes  . Esophageal dysphagia     Jshawn is clear that this diarrhea occurs with increased dose of metformin.  If lower dose of this medicine,  which does not seem to cause diarrhea for him, is insufficient to adequately control his blood sugar, then consideration should be given to an alternate medicine.  That would seem preferable to continued use of Imodium to counteract the side effect of higher dose metformin.  Plan:  No further testing planned. See me as needed.  Total time 15 minutes, over half spent face-to-face with patient in counseling and coordination of care.   [name] CC: Anastasia Pall, MD

## 2018-09-30 ENCOUNTER — Ambulatory Visit: Payer: Medicare Other | Admitting: Gastroenterology

## 2018-11-06 IMAGING — DX DG TIBIA/FIBULA 2V*R*
2 series · 2 of 2 positions shown · non-contrast
Comparison: Right tib-fib series 07/20/2017.

CLINICAL DATA: 47-year-old male with cerebral palsy. Cellulitis.
Right lower leg pains morning. Contractures.

EXAM:
RIGHT TIBIA AND FIBULA - 2 VIEW

[tibia ap (1 of 2)]
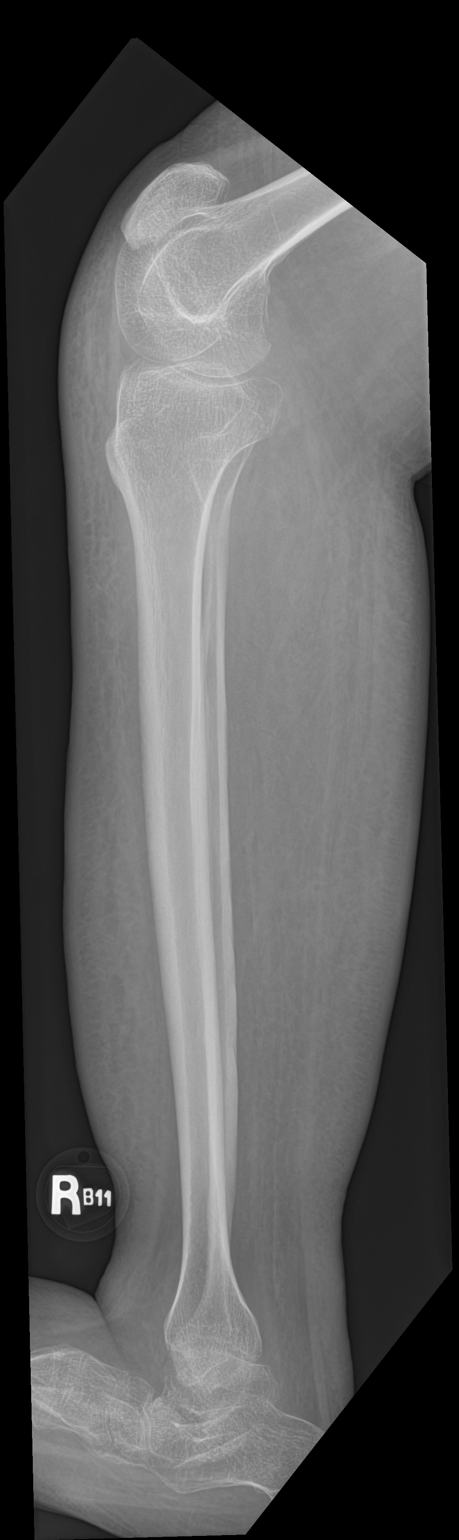

[tibia ap (2 of 2)]
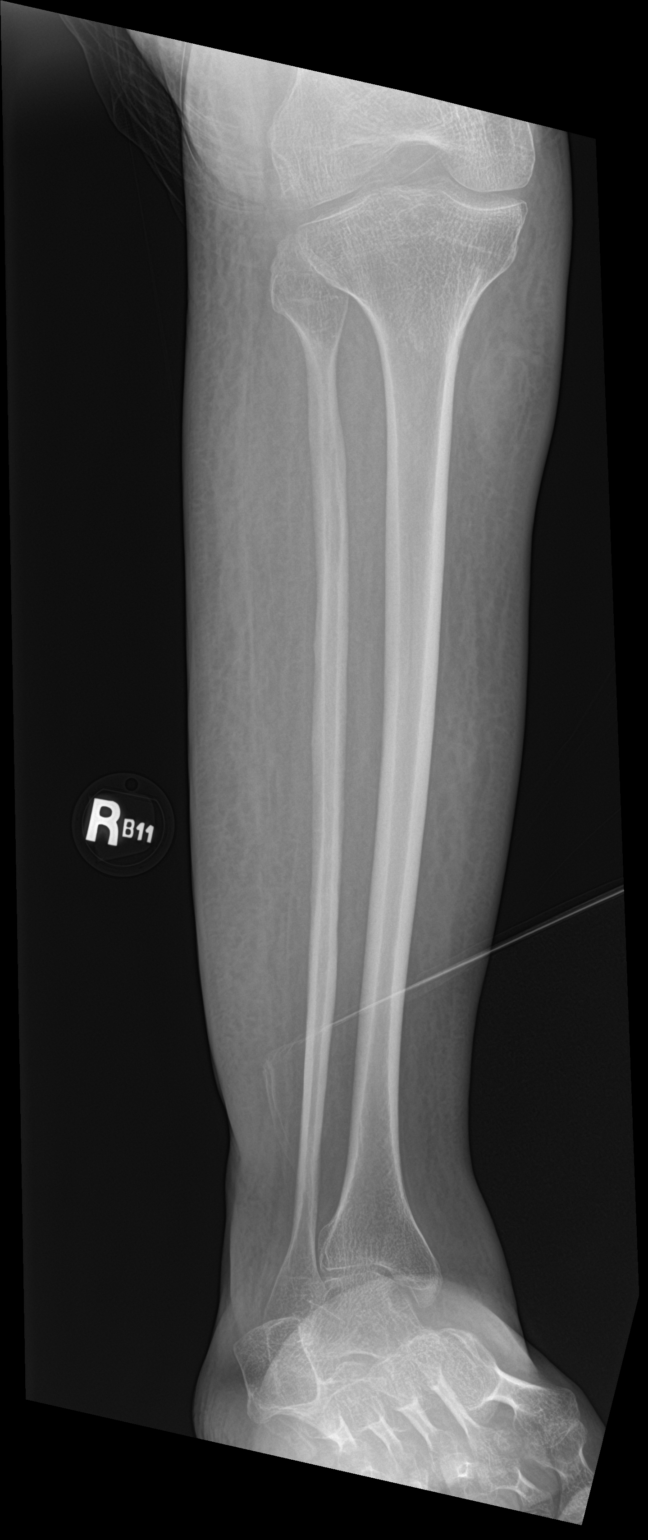

[2 of 2 positions shown; findings below may reference images not displayed]

FINDINGS: Stable dysplastic appearance of the proximal and distal tibia and
fibula at the knee and ankle joints. Pes planus partially visible.
Osteopenia. No acute osseous abnormality identified. No knee joint
effusion is evident.

Generalized right lower extremity soft tissue swelling and fat
stranding. No soft tissue gas. No radiopaque foreign body
identified.
IMPRESSION: 1.  No acute osseous abnormality identified.
2. Soft tissue swelling with no soft tissue gas or radiopaque
foreign body identified.

## 2018-12-12 ENCOUNTER — Ambulatory Visit: Payer: Medicare Other | Admitting: Podiatry

## 2019-01-12 ENCOUNTER — Other Ambulatory Visit: Payer: Self-pay

## 2019-01-12 ENCOUNTER — Encounter: Payer: Self-pay | Admitting: Emergency Medicine

## 2019-01-12 ENCOUNTER — Ambulatory Visit
Admission: EM | Admit: 2019-01-12 | Discharge: 2019-01-12 | Disposition: A | Discharge status: Home / Self Care | Payer: Medicare Other

## 2019-01-12 DIAGNOSIS — M25572 Pain in left ankle and joints of left foot: Secondary | ICD-10-CM | POA: Diagnosis not present

## 2019-01-12 NOTE — ED Provider Notes (Signed)
[facility]    CSN: [id number] Arrival date & time: 01/12/19  0919      History   Chief Complaint Chief Complaint  Patient presents with  . Ankle Pain    HPI Thomas Ramos is a 48 y.o. male with history of CP, quadriplegia presenting for left ankle pain.  Patient states that he was trying to turn a corner and hit his ankle against the corner of the door.  Patient noticed mild swelling, for which they use ice, Epson salt soaks with relief.  Patient denying pain at rest, though worse when putting his shoe on or off.  Of note, patient has history of cellulitis in the right leg and wants to make sure there is no infection currently.  Denies pain, endorses reddish area over shin.  Has not tried thing for this.  Of note, patient does not attend physical therapy, have rehabilitative services at home.  Does not wear compression socks at baseline.  Patient does have a caregiver, who accompanies him today.  Patient is able to provide his own history.   Past Medical History:  Diagnosis Date  . Anxiety   . Cellulitis 08/04/2017  . Cerebral palsy ([place])   . Chronic diastolic (congestive) heart failure ([place])   . CP (cerebral palsy) ([place])   . Diabetes mellitus without complication (HCC)    borderline  . Environmental allergies    takes inhalers if needed  . Esophageal stricture   . GERD (gastroesophageal reflux disease)   . Hypertension   . Motility disorder, esophageal   . Pneumonia   . 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 S/P Botox injection    approx every 4 months  . Seasonal allergies     Patient Active Problem List   Diagnosis Date Noted  . Hiatal hernia   . Cellulitis of right lower extremity without foot 08/03/2017  . Type 2 diabetes mellitus with complication, without long-term current use of insulin ([place]) 08/03/2017  . PNA (pneumonia) 05/07/2017  . SIRS (systemic inflammatory response syndrome) (HCC)   . Acute respiratory failure with hypoxia ([place])  03/13/2017  . 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 Rhinovirus infection 08/17/2016  . Pressure injury of skin 08/16/2016  . Acute respiratory distress 08/15/2016  . Tachycardia 08/15/2016  . Sepsis ([place]) 08/15/2016  . Onychomycosis 02/20/2016  . Essential hypertension   . OSA (obstructive sleep apnea) 12/11/2015  . Chronic diastolic (congestive) heart failure ([place])   . Chronic venous insufficiency   . Depression with anxiety 09/20/2015  . CAP (community acquired pneumonia) 09/20/2015  . Hypersomnia 07/16/2015  . Asthma exacerbation 07/16/2015  . GERD (gastroesophageal reflux disease) 07/16/2015  . Snoring 07/16/2015  . Hematemesis with nausea   . Fever 05/24/2014  . Hyperlipidemia 02/22/2012  . Dysphagia 09/16/2010  . 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 BOTOX INJECTION N/A 12/21/2012   Procedure: BOTOX INJECTION;  Surgeon: Inda Castle, MD;  Location: [facility];  Service: Endoscopy;  Laterality: N/A;  . BOTOX INJECTION N/A 06/28/2014   Procedure: BOTOX INJECTION;  Surgeon: Inda Castle, MD;  Location: [facility];  Service: Endoscopy;  Laterality: N/A;  . ESOPHAGOGASTRODUODENOSCOPY N/A 12/21/2012   Procedure: ESOPHAGOGASTRODUODENOSCOPY (EGD);  Surgeon: Inda Castle, MD;  Location: [facility];  Service: Endoscopy;  Laterality: N/A;  . ESOPHAGOGASTRODUODENOSCOPY N/A 06/28/2014   Procedure: ESOPHAGOGASTRODUODENOSCOPY (EGD);  Surgeon: Inda Castle, MD;  Location: [facility];  Service: Endoscopy;  Laterality: N/A;  . ESOPHAGOGASTRODUODENOSCOPY (EGD) WITH PROPOFOL N/A 09/03/2017   Procedure: ESOPHAGOGASTRODUODENOSCOPY (EGD) WITH PROPOFOL;  Surgeon: Doran Stabler, MD;  Location: [facility];  Service: Gastroenterology;  Laterality: N/A;  . ESOPHAGUS SURGERY     stretched esophagus  . legs    . MOUTH SURGERY    . TENDON RELEASE         Home Medications    Prior to Admission medications    Medication Sig Start Date End Date Taking? Authorizing Provider  amLODipine (NORVASC) 10 MG tablet Take 10 mg by mouth daily.   Yes [provider]  aspirin 81 MG chewable tablet Chew by mouth daily.   Yes [provider]  baclofen (LIORESAL) 10 MG tablet Take 1/2 tablet (5 mg dose) by MOUTH AT bedtime. 06/01/18  Yes [provider]  empagliflozin (JARDIANCE) 10 MG TABS tablet Take 10 mg by mouth daily.   Yes [provider]  furosemide (LASIX) 20 MG tablet Take 20 mg by mouth daily. 07/15/17  Yes [provider]  metFORMIN (GLUCOPHAGE) 500 MG tablet Take 1,000 mg by mouth at bedtime.    Yes [provider]  montelukast (SINGULAIR) 10 MG tablet Take 10 mg by mouth daily.  08/12/16  Yes [provider]  omeprazole (PRILOSEC) 20 MG capsule Take 20 mg by mouth daily.   Yes [provider]  potassium chloride (K-DUR) 10 MEQ tablet Take 10 mEq by mouth daily.   Yes [provider]  rosuvastatin (CRESTOR) 5 MG tablet Take 5 mg by mouth at bedtime.   Yes [provider]  valsartan (DIOVAN) 80 MG tablet Take 80 mg by mouth daily.   Yes [provider]  budesonide-formoterol (SYMBICORT) 80-4.5 MCG/ACT inhaler Inhale 2 puffs into the lungs 2 (two) times daily.    [provider]  [employer] CLOTRIMAZOLE 1 % cream Apply 1 application topically 2 (two) times daily as needed (rash). BETWEEN TOES 08/19/17   [provider]  latanoprost (XALATAN) 0.005 % ophthalmic solution Place 1 drop into both eyes daily as needed (dry eyes).     [provider]  mupirocin ointment (BACTROBAN) 2 % Apply to affected as needed for rash 04/30/14   [provider]  naproxen sodium (ALEVE) 220 MG tablet Take 220-440 mg by mouth 2 (two) times daily as needed (for pain).    [provider]  nitroGLYCERIN (NITROSTAT) 0.4 MG SL tablet Place 0.4 mg under the tongue every 5 (five) minutes as needed for chest  pain.     [provider]  ondansetron (ZOFRAN) 4 MG tablet Take 1 tablet (4 mg total) by mouth every 6 (six) hours. Patient taking differently: Take 4 mg by mouth every 6 (six) hours as needed for nausea or vomiting.  02/05/17   Orpah Greek, MD    Family History Family History  Problem Relation Age of Onset  . Hypertension Father   . Lung cancer Father   . Diabetes Mother     Social History Social History   Tobacco Use  . Smoking status: Never Smoker  . Smokeless tobacco: Former Systems developer  . Tobacco comment: smoked one time  Substance Use Topics  . Alcohol use: Yes    Comment: 2 beers every other Friday and Saturday  . Drug use: No     Allergies   Penicillins, Sulfamethoxazole-trimethoprim, and Metronidazole   Review of Systems Review of Systems  Constitutional: Negative for fatigue and fever.  Respiratory: Negative for cough  and shortness of breath.   Cardiovascular: Negative for chest pain and palpitations.  Musculoskeletal:       Positive for left ankle pain with motion  Neurological: Negative for weakness and numbness.     Physical Exam Triage Vital Signs ED Triage Vitals  Enc Vitals Group     BP      Pulse      Resp      Temp      Temp src      SpO2      Weight      Height      Head Circumference      Peak Flow      Pain Score      Pain Loc      Pain Edu?      Excl. in [place]?    No data found.  Updated Vital Signs BP (!) 152/94 (BP Location: Left Arm)   Pulse (!) 110   Resp 18   SpO2 98%   Visual Acuity Right Eye Distance:   Left Eye Distance:   Bilateral Distance:    Right Eye Near:   Left Eye Near:    Bilateral Near:     Physical Exam Constitutional:      General: He is not in acute distress.    Comments: Patient in wheelchair  HENT:     Head: Normocephalic and atraumatic.     Mouth/Throat:     Mouth: Mucous membranes are moist.  Eyes:     General: No scleral icterus.    Pupils: Pupils are equal, round, and  reactive to light.  Cardiovascular:     Rate and Rhythm: Regular rhythm. Tachycardia present.     Comments: HR 94-113 during time w/ provider.  Per caregiver this is chronic/stable. Pulmonary:     Effort: Pulmonary effort is normal.  Musculoskeletal:     Comments: Patient unable to move lower extremities. Right foot with nonpitting edema over dorsal aspect of foot without discoloration.  Nontender.  Cap refill less than 2 seconds.  Sensation tact. Right shin with mild area of warm erythema that is nontender.  No open wound, streaking, underlying fluctuance.  Patient states this is been there "for weeks ". Left foot/ankle without significant swelling as compared to right.  No bony tenderness throughout ankle joint, foot.  Negative squeeze test.  Cap refill less than 2 seconds.  Sensation intact.  Passive ROM mildly tender with full dorsiflexion diffusely.  Skin:    Capillary Refill: Capillary refill takes less than 2 seconds.     Coloration: Skin is not jaundiced or pale.  Neurological:     Mental Status: He is alert and oriented to person, place, and time.      UC Treatments / Results  Labs (all labs ordered are listed, but only abnormal results are displayed) Labs Reviewed - No data to display  EKG   Radiology No results found.  Procedures Procedures (including critical care time)  Medications Ordered in UC Medications - No data to display  Initial Impression / Assessment and Plan / UC Course  I have reviewed the triage vital signs and the nursing notes.  Pertinent labs & imaging results that were available during my care of the patient were reviewed by me and considered in my medical decision making (see chart for details).     Given mechanism of injury, reassuring exam, radiography deferred today: Patient and caregiver are okay with this.  Will treat conservatively with RICE, additional supportive  treatment as outlined below.  Also discussed chronic management of lower  extremity edema second to immobility.  Recommend discussing possible PT referral through PCP to rent/manage chronic LEE.  Low concern for cellulitis over right shin at this time due to lack of fever, pain-caregiver will monitor closely & treat supportively in the interim.  Return precautions discussed, patient verbalized understanding and is agreeable to plan. Final Clinical Impressions(s) / UC Diagnoses   Final diagnoses:  Acute left ankle pain     Discharge Instructions     Can continue Epson salt soaks. Recommend compression socks on both legs to help with chronic swelling. Important to monitor legs for cellulitis: Areas may become red, warm, though pain should increase concern for infectious process. Also recommend discussing PT referral with PCP to help prevent/manage chronic lower extremity edema which can cause foot/ankle/leg pain over time. Return for worsening pain, swelling, discoloration.    ED Prescriptions    None     PDMP not reviewed this encounter.   Hall[place], [place], [place] 01/12/19 1043

## 2019-01-12 NOTE — Discharge Instructions (Addendum)
Can continue Epson salt soaks. Recommend compression socks on both legs to help with chronic swelling. Important to monitor legs for cellulitis: Areas may become red, warm, though pain should increase concern for infectious process. Also recommend discussing PT referral with PCP to help prevent/manage chronic lower extremity edema which can cause foot/ankle/leg pain over time. Return for worsening pain, swelling, discoloration.

## 2019-01-12 NOTE — ED Triage Notes (Signed)
Pt presents to [facility] for assessment after bumping his left foot yesterday and experiencing ankle pain.  Denies pain at this time, but worse pain if he has to put or take his shoe off, or if he bumps into anything.

## 2019-01-12 NOTE — ED Notes (Signed)
Patient able to ambulate independently  

## 2019-02-24 ENCOUNTER — Other Ambulatory Visit: Payer: Self-pay

## 2019-02-24 ENCOUNTER — Encounter: Payer: Self-pay | Admitting: Podiatry

## 2019-02-24 ENCOUNTER — Ambulatory Visit (INDEPENDENT_AMBULATORY_CARE_PROVIDER_SITE_OTHER): Payer: Medicare Other | Admitting: Podiatry

## 2019-02-24 DIAGNOSIS — M79674 Pain in right toe(s): Secondary | ICD-10-CM | POA: Diagnosis not present

## 2019-02-24 DIAGNOSIS — M79675 Pain in left toe(s): Secondary | ICD-10-CM

## 2019-02-24 DIAGNOSIS — B351 Tinea unguium: Secondary | ICD-10-CM | POA: Diagnosis not present

## 2019-03-02 ENCOUNTER — Ambulatory Visit: Payer: Medicare Other | Attending: Family Medicine | Admitting: Physical Therapy

## 2019-03-02 ENCOUNTER — Other Ambulatory Visit: Payer: Self-pay

## 2019-03-02 DIAGNOSIS — M25661 Stiffness of right knee, not elsewhere classified: Secondary | ICD-10-CM | POA: Diagnosis present

## 2019-03-02 DIAGNOSIS — M25662 Stiffness of left knee, not elsewhere classified: Secondary | ICD-10-CM | POA: Diagnosis present

## 2019-03-02 DIAGNOSIS — R293 Abnormal posture: Secondary | ICD-10-CM

## 2019-03-02 DIAGNOSIS — G800 Spastic quadriplegic cerebral palsy: Secondary | ICD-10-CM | POA: Diagnosis not present

## 2019-03-03 NOTE — Therapy (Addendum)
[facility] [address] [employer] [facility] [place], [place], [address] Phone: [phone]   Fax:  [phone]  Physical Therapy Evaluation  Patient Details  Name: Thomas Ramos MRN: [id number] Date of Birth: 27-Nov-1970 Referring Provider (PT): Chesley Noon, MD   Encounter Date: 03/02/2019  PT End of Session - 03/03/19 1035    Visit Number  1    Number of Visits  1    Date for PT Re-Evaluation  03/02/19    Authorization Type  Medicare and Medicaid    PT Start Time  1230    PT Stop Time  1329    PT Time Calculation (min)  59 min    Activity Tolerance  Patient tolerated treatment well    Behavior During Therapy  [facility] for tasks assessed/performed       Past Medical History:  Diagnosis Date  . Anxiety   . Cellulitis 08/04/2017  . Cerebral palsy ([place])   . Chronic diastolic (congestive) heart failure ([place])   . CP (cerebral palsy) ([place])   . Diabetes mellitus without complication (HCC)    borderline  . Environmental allergies    takes inhalers if needed  . Esophageal stricture   . GERD (gastroesophageal reflux disease)   . Hypertension   . Motility disorder, esophageal   . Pneumonia   . 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 S/P Botox injection    approx every 4 months  . Seasonal allergies     Past Surgical History:  Procedure Laterality Date  . BOTOX INJECTION N/A 12/21/2012   Procedure: BOTOX INJECTION;  Surgeon: Inda Castle, MD;  Location: [facility];  Service: Endoscopy;  Laterality: N/A;  . BOTOX INJECTION N/A 06/28/2014   Procedure: BOTOX INJECTION;  Surgeon: Inda Castle, MD;  Location: [facility];  Service: Endoscopy;  Laterality: N/A;  . ESOPHAGOGASTRODUODENOSCOPY N/A 12/21/2012   Procedure: ESOPHAGOGASTRODUODENOSCOPY (EGD);  Surgeon: Inda Castle, MD;  Location: [facility];  Service: Endoscopy;  Laterality: N/A;  . ESOPHAGOGASTRODUODENOSCOPY N/A 06/28/2014   Procedure: ESOPHAGOGASTRODUODENOSCOPY  (EGD);  Surgeon: Inda Castle, MD;  Location: [facility];  Service: Endoscopy;  Laterality: N/A;  . ESOPHAGOGASTRODUODENOSCOPY (EGD) WITH PROPOFOL N/A 09/03/2017   Procedure: ESOPHAGOGASTRODUODENOSCOPY (EGD) WITH PROPOFOL;  Surgeon: Doran Stabler, MD;  Location: [facility];  Service: Gastroenterology;  Laterality: N/A;  . ESOPHAGUS SURGERY     stretched esophagus  . legs    . MOUTH SURGERY    . TENDON RELEASE      There were no vitals filed for this visit.   Subjective Assessment - 03/02/19 1718    Subjective  Pt evaluated for complex customized seating system last year, delivered in May.  Pt now returns to evaluation for new power mobility base with power seat functions as his current one is >69 years old and has required multiple repairs.    Patient is accompained by:  --   caregiver Blair Heys PT Assessment - 03/03/19 1032      Assessment   Medical Diagnosis  CP - wheelchair evaluation    Referring Provider (PT)  Chesley Noon, MD    Onset Date/Surgical Date  01/19/19    Prior Therapy  yes      Home Environment   Living Environment  Group home      Prior Function   Level of Independence  Needs assistance with ADLs;Needs assistance with transfers  Mobility/Seating Evaluation    PATIENT INFORMATION: Name: Thomas Ramos DOB: Jan 08, 1971  Sex: Male Date seen: 03/02/2019 Time: 12:30  Address:  [address]  [place] [address] Physician: Chesley Noon, MD This evaluation/justification form will serve as the LMN for the following suppliers: __________________________ Supplier: [employer] Contact Person: Deberah Pelton, ATP Phone:  [phone]   Seating Therapist: Misty Stanley, PT Phone:   [phone]   Phone: [phone] (Mobile)     Spouse/Parent/Caregiver name: Caregiver Aaron Edelman  Phone number: [phone] Insurance/Payer: Medicare A and B Medicaid     Reason for Referral: Power Mobility Evaluation  Patient/Caregiver Goals: To  obtain a new power mobility base  - current one is >84 years old  Patient was seen for face-to-face evaluation for new power wheelchair.  Also present was Deberah Pelton, ATP and  to discuss recommendations and wheelchair options.  Further paperwork was completed and sent to vendor.  Patient appears to qualify for power mobility device at this time per objective findings.   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   History including Falls: Type 2 DM, cellulitis and edema of bilat LE, PNA, SIRS, tachycardia, sepsis, HTN, OSA, venous insufficiency, depression with anxiety, asthma, dysphagia    HOME ENVIRONMENT: '[x]' House  '[]' Condo/town home  '[]' Apartment  '[x]' Assisted Living    '[]' Lives Alone '[x]'  Lives with Others                                                                                          Hours with caregiver: 24/7  '[x]' Home is accessible to patient           Stairs      '[]' Yes '[]'  No     Ramp '[x]' Yes '[]' No Comments:  Patient lives in a group home with caregivers; Home accessible to power chair except pt is unable to reach items high in closets, cabinets, etc due to lack of shoulder ROM   COMMUNITY ADL: TRANSPORTATION: '[]' Car    '[]' Van    '[x]' Public Transportation    '[]' Adapted w/c Lift    '[]' Ambulance    '[]' Other:       '[x]' Sits in wheelchair during transport  Employment/School: Works at [employer], 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    FUNCTIONAL/SENSORY PROCESSING SKILLS:  Handedness:   '[]' Right     '[x]' Left    '[]' NA  Comments:  ?????  Functional Processing Skills for Wheeled Mobility '[x]' Processing Skills are adequate for safe wheelchair operation  Areas of concern than may interfere with safe  operation of wheelchair Description of problem   '[]'  Attention to environment      '[]' Judgment      '[]'  Hearing  '[]'  Vision or visual processing      '[]' Motor Planning  '[]'  Fluctuations in Behavior  ?????    VERBAL COMMUNICATION: '[x]' WFL receptive '[]'  WFL expressive '[x]' Understandable  '[]' Difficult to understand  '[]' non-communicative '[]'  Uses an augmented communication device  CURRENT SEATING / MOBILITY: Current Mobility Base:  '[]' None '[]' Dependent '[]' Manual '[]' Scooter '[x]' Power  Type of Control: ?????  Manufacturer:  Permobile F3 Size:  19 x 18Age: 5.5 year  Current Condition of Mobility Base:  Multiple repairs estimating $10,000   Current Wheelchair components:  custom seat back and cushion, power tilt, power recline, manual elevating leg rests  Describe posture in present seating system:  Good - Custom molded seating performed last year - 11/2017      SENSATION and SKIN ISSUES: Sensation '[x]' Intact  '[]' Impaired '[]' Absent  Level of sensation: ????? Pressure Relief: Able to perform effective pressure relief :    '[]' Yes  '[x]'  No Method: ???? If not, Why?: lacks sufficient postural control and UE strength to perform lateral leans, unable to stand to pressure relieve  Skin Issues/Skin Integrity Current Skin Issues  '[]' Yes '[x]' No '[]' Intact '[x]'  Red area'[]'  Open Area  '[]' Scar Tissue '[x]' At risk from prolonged sitting Where  ?????  History of Skin Issues  '[x]' Yes '[]' No Where  breakdown on sacrum/buttocks and between skin folds on abdomen from seat belt -extensor tone caused abdomen to push into seat belt When  ?????  Hx of skin flap surgeries   '[]' Yes '[x]' No Where  ????? When  ?????  Limited sitting tolerance '[]' Yes '[x]' No Hours spent sitting in wheelchair daily: >12 hours  Complaint of Pain:  Please describe: none   Swelling/Edema: Yes in bilateral lower extremities due to ongoing Cellulitis.  Doctor would like him to be able to take shoes off during the day to decrease pressure on feet and has requested padding for foot plates to avoid pressure/skin breakdown   ADL STATUS (in reference to wheelchair use):  Indep Assist Unable Indep with Equip Not assessed Comments  Dressing ????? x ????? ????? ????? Bed and Chair level  Eating ????? ????? ????? X ????? Seated in wheelchair  Toileting ????? x ????? ????? ????? Bed and Chair level  Bathing ????? x ????? ????? ????? Bed level  Grooming/Hygiene ????? x ????? ????? ????? Seated in wheelchair  Meal Prep ????? x ????? ????? ????? ?????  IADLS ????? x ????? ????? ????? ?????  Bowel Management: '[]' Continent  '[x]' Incontinent  '[]' Accidents Comments:  ?????  Bladder Management: '[]' Continent  '[x]' Incontinent  '[]' Accidents Comments:  ?????     WHEELCHAIR SKILLS: Manual w/c Propulsion: '[]' UE or LE strength and endurance sufficient to participate in ADLs using manual wheelchair Arm : '[]' left '[]' right   '[]' Both      Distance: ????? Foot:  '[]' left '[]' right   '[]' Both  Operate Scooter: '[]'  Strength, hand grip, balance and transfer appropriate for use '[]' Living environment is accessible for use of scooter  Operate Power w/c:  '[x]'  Std. Joystick   '[]'  Alternative Controls Indep '[x]'  Assist '[]'  Dependent/unable '[]'  N/A '[]'   '[x]' Safe          '[x]'  Functional      Distance: >1,000 feet  Bed confined without wheelchair '[x]'  Yes '[]'  No   STRENGTH/RANGE OF MOTION:  PROM Range of Motion Strength  Shoulder Right = <10 degrees, Left = 90 degrees Right = 1/5 needs manual assistance, Left =  2/5  Elbow Flexion contractures on R; L WFL Right = 1/5; Left = 2/5  Wrist/Hand WFL on L Right = 1/5; Left = 2/5  Hip flexion  contratures bilateral 1/5 bilaterally  Knee Right knee ext lacking 63 degrees, >90 degrees flexion. Left knee ext lacking 61 degrees, >90 flexion 1/5 bilaterally  Ankle Right DF=15, Left DF=10 0/5     MOBILITY/BALANCE:  '[x]'  Patient is totally dependent for mobility  Dependent for transfers and is unable to ambulate; requires power wheelchair for mobility - otherwise would be bed bound    Balance Transfers Ambulation  Sitting Balance: Standing Balance: '[]'  Independent '[]'  Independent/Modified Independent  '[]'  WFL     '[]'  WFL '[]'  Supervision '[]'  Supervision  '[]'  Uses UE for balance  '[]'  Supervision '[]'  Min Assist '[]'  Ambulates with Assist  ?????    '[]'  Min Assist '[]'  Min assist '[]'  Mod Assist '[]'  Ambulates with Device:      '[]'  RW  '[]'  StW  '[]'  Cane  '[]'  ?????  '[]'  Mod Assist '[]'  Mod assist '[]'  Max assist   '[]'  Max Assist '[]'  Max assist '[]'  Dependent '[]'  Indep. Short Distance Only  '[x]'  Unable '[x]'  Unable '[x]'  Lift / Sling Required Distance (in feet)  ?????   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  Thomas Ramos is a lifelong wheelchair user.  Thomas Ramos is unable to propel any type of manual wheelchair because he lacks UE strength, range of motion and postural control for independent mobility.  His ability to manually propel functional distances is further limited by his cardiopulmonary impairments.  Thomas Ramos is unable to safely utilize an Transport planner (POV) because he requires total assistance for transfers and  to maintain upright sitting balance and he lacks the UE range of motion and strength to drive a scooter.  Due to Thomas Ramos's multiple postural impairments and obliquities and inability to effectively pressure relieve, he requires the use of custom molded seat back and cushion which could not be utilized on a manual wheelchair or electric scooter.  Thomas Ramos spends >12 hours in his wheelchair for work and requires the use of power mobility to be able to access his work environment and participate in [place] at home with his caregivers.  He requires power seat functions of tilt and recline to provide management of extensor tone, to perform effective pressure relief, rest periods, repositioning, and management of LE edema.  Thomas Ramos is unable to utilize his RUE and is only able to raise his LUE to 90 degrees of flexion; because of this strength and ROM limitation he will also benefit from the use of a power seat elevator to allow him to independently reach items above shoulder height in his home and work environment.F         Anterior / Posterior Obliquity Rotation-Pelvis Pt was evaluated for and provided custom molded seating system last year - delivered in May of 2020.  Current seat back and cushion provide  accommodation and support for patient's postural impairments and obliquities and will therefore be transferred to his new power mobility base.   PELVIS    '[]'  '[]'  '[]'   Neutral Posterior Anterior  '[]'  '[]'  '[]'   WFL Rt elev Lt elev  '[]'  '[]'  '[]'   WFL Right Left                      Anterior    Anterior     '[]'  Fixed '[]'  Other '[]'  Partly Flexible '[]'  Flexible   '[]'  Fixed '[]'  Other '[]'  Partly Flexible  '[]'  Flexible  '[]'  Fixed '[]'  Other '[]'  Partly Flexible  '[]'  Flexible   TRUNK  '[]'  '[]'  '[]'   WFL ? Thoracic ? Lumbar  Kyphosis Lordosis  '[]'  '[]'  '[]'   WFL Convex Convex  Right Left '[]' c-curve '[]' s-curve '[]' multiple  '[]'  Neutral '[]'  Left-anterior '[]'  Right-anterior     '[]'  Fixed '[]'  Flexible '[]'  Partly Flexible '[]'  Other  '[]'  Fixed '[]'   Flexible '[]'  Partly Flexible '[]'  Other  '[]'  Fixed             '[]'  Flexible '[]'  Partly Flexible '[]'  Other    Position Windswept  ?????  HIPS          '[]'            '[]'               '[]'    Neutral       Abduct        ADduct         '[]'           '[]'            '[]'   Neutral Right           Left      '[]'  Fixed '[]'  Subluxed '[]'  Partly Flexible '[]'  Dislocated '[]'  Flexible  '[]'  Fixed '[]'  Other '[]'  Partly Flexible  '[]'  Flexible                 Foot Positioning Knee Positioning  ?????    '[]'  WFL  '[]' Lt '[]' Rt '[]'  WFL  '[]' Lt '[]' Rt    KNEES ROM concerns: ROM concerns:    & Dorsi-Flexed '[x]' Lt '[x]' Rt Flexion contractures    FEET Plantar Flexed '[]' Lt '[]' Rt      Inversion                 '[]' Lt '[]' Rt      Eversion                 '[x]' Lt '[x]' Rt     HEAD '[]'  Functional '[]'  Good Head Control  ?????  & '[x]'  Flexed         '[]'  Extended '[]'  Adequate Head Control    NECK '[]'  Rotated  Lt  '[]'  Lat Flexed Lt '[x]'  Rotated  Rt '[x]'  Lat Flexed Rt '[x]'  Limited Head Control     '[]'  Cervical Hyperextension '[]'  Absent  Head Control     SHOULDERS ELBOWS WRIST& HAND ?????      Left     Right    Left     Right    Left     Right   U/E '[]' Functional           '[]' Functional WFL flexion contracture '[]' Fisting             '[]' Fisting      '[]' elev   '[]' dep      '[]' elev   '[]' dep       '[x]' pro -'[]'   retract     '[x]' pro  '[]' retract '[]' subluxed             '[]' subluxed           Goals for Wheelchair Mobility  '[x]'  Independence with mobility in the home with motor related ADLs (MRADLs)  '[x]'  Independence with MRADLs in the community '[]'  Provide dependent mobility  '[x]'  Provide recline     '[x]' Provide tilt   Goals for Seating system '[x]'  Optimize pressure distribution '[x]'  Provide support needed to facilitate function or safety '[]'  Provide corrective forces to assist with maintaining or improving posture '[x]'  Accommodate client's posture:   current seated postures and positions are not flexible or will not tolerate corrective forces '[x]'  Client to be independent with relieving pressure  in the wheelchair '[x]' Enhance physiological function such as breathing, swallowing, digestion  Simulation ideas/Equipment trials:????? State why other equipment was unsuccessful:?????   MOBILITY BASE RECOMMENDATIONS and JUSTIFICATION: MOBILITY COMPONENT JUSTIFICATION  Manufacturer: PermobilModel: F3   Size: Width 19Seat Depth 18 '[x]' provide transport from point A to B      '[x]' promote Indep mobility  '[x]' is not a safe, functional ambulator '[x]' walker or cane inadequate '[]' non-standard width/depth necessary to accommodate anatomical measurement '[]'  ?????  '[]' Manual Mobility Base '[]' non-functional ambulator    '[]' Scooter/POV  '[]' can safely operate  '[]' can safely transfer   '[]' has adequate trunk stability  '[]' cannot functionally propel manual w/c  '[x]' Power Mobility Base  '[x]' non-ambulatory  '[x]' cannot functionally propel manual wheelchair  '[x]'  cannot functionally and safely operate scooter/POV '[x]' can safely operate and willing to  '[]' Stroller Base '[]' infant/child  '[]' unable to propel manual wheelchair '[]' allows for growth '[]' non-functional ambulator '[]' non-functional UE '[]' Indep mobility is not a goal at this time  '[x]' Tilt  '[]' Forward '[x]' Backward '[x]' Powered tilt  '[]' Manual tilt  '[x]' change position against gravitational force on head and shoulders  '[x]' change position for pressure relief/cannot weight shift '[x]' transfers  '[x]' management of tone '[x]' rest periods '[x]' control edema '[x]' facilitate postural control  '[]'  ?????  '[x]' Recline  '[x]' Power recline on power base '[]' Manual recline on manual base  '[x]' accommodate femur to back angle  '[x]' bring to full recline for ADL care  '[x]' change position for pressure relief/cannot weight shift '[x]' rest periods '[x]' repositioning for transfers or clothing/diaper /catheter changes '[x]' head positioning  '[]' Lighter weight required '[]' self- propulsion  '[]' lifting '[]'  ?????  '[]' Heavy Duty required '[]' user weight greater than 250# '[]' extreme tone/ over active movement '[]' broken frame on  previous chair '[]'  ?????  '[]'  Back  '[]'  Angle Adjustable '[]'  Custom molded ????? '[]' postural control '[]' control of tone/spasticity '[]' accommodation of range of motion '[]' UE functional control '[]' accommodation for seating system '[]'  ????? '[]' provide lateral trunk support '[]' accommodate deformity '[]' provide posterior trunk support '[]' provide lumbar/sacral support '[]' support trunk in midline '[]' Pressure relief over spinal processes  '[]'  Seat Cushion ????? '[]' impaired sensation  '[]' decubitus ulcers present '[]' history of pressure ulceration '[]' prevent pelvic extension '[]' low maintenance  '[]' stabilize pelvis  '[]' accommodate obliquity '[]' accommodate multiple deformity '[]' neutralize lower extremity position '[]' increase pressure distribution '[]'  ?????  '[]'  Pelvic/thigh support  '[]'  Lateral thigh guide '[]'  Distal medial pad  '[]'  Distal lateral pad '[]'  pelvis in neutral '[]' accommodate pelvis '[]'  position upper legs '[]'  alignment '[]'  accommodate ROM '[]'  decr adduction '[]' accommodate tone '[]' removable for transfers '[]' decr abduction  '[]'  Lateral trunk Supports '[]'  Lt     '[]'  Rt '[]' decrease lateral trunk leaning '[]' control tone '[]' contour for increased contact '[]' safety  '[]' accommodate asymmetry '[]'  ?????  '[x]'  Mounting hardware  '[]' lateral trunk supports  '[x]' back   '[]' seat '[x]' headrest      '[]'  thigh support '[]' fixed   '[]' swing away '[]' attach  seat platform/cushion to w/c frame '[x]' attach back cushion to w/c frame '[]' mount postural supports '[x]' mount headrest  '[]' swing medial thigh support away '[]' swing lateral supports away for transfers  '[]'  ?????    Armrests  '[]' fixed '[x]' adjustable height '[]' removable   '[]' swing away  '[x]' flip back   '[]' reclining '[x]' full length pads '[]' desk    '[]' pads tubular  '[x]' provide support with elbow at 90   '[x]' provide support for w/c tray '[x]' change of height/angles for variable activities '[x]' remove for transfers '[x]' allow to come closer to table top '[x]' remove for access to tables '[]'  ?????  Hangers/ Leg rests   '[]' 60 '[]' 70 '[]' 90 '[x]' elevating '[]' heavy duty  '[]' articulating '[]' fixed '[]' lift off '[]' swing away     '[]' power '[x]' provide LE support  '[x]' accommodate to hamstring tightness '[x]' elevate legs during recline   '[x]' provide change in position for Legs '[]' Maintain placement of feet on footplate '[]' durability '[]' enable transfers '[x]' decrease edema '[]' Accommodate lower leg length '[]'  ?????  Foot support Footplate    '[x]' Lt  '[x]'  Rt  '[]'  Center mount '[x]' flip up     '[x]' depth/angle adjustable '[]' Amputee adapter    '[]'  Lt     '[]'  Rt '[x]'  Stealth Gel Pads Wide Footplates  '[x]' provide foot support '[x]' accommodate to ankle ROM '[x]' transfers '[]' Provide support for residual extremity '[x]'  allow foot to go under wheelchair base '[x]'  decrease tone  '[x]'  Pressure relief when sitting without shoes on  '[]'  Ankle strap/heel loops '[]' support foot on foot support '[]' decrease extraneous movement '[]' provide input to heel  '[]' protect foot  Tires: '[x]' pneumatic  '[x]' flat free inserts  '[]' solid  '[x]' decrease maintenance  '[x]' prevent frequent flats '[]' increase shock absorbency '[]' decrease pain from road shock '[]' decrease spasms from road shock '[]'  ?????  '[x]'  Headrest  '[x]' provide posterior head support '[x]' provide posterior neck support '[]' provide lateral head support '[]' provide anterior head support '[x]' support during tilt and recline '[]' improve feeding   '[]' improve respiration '[]' placement of switches '[x]' safety  '[]' accommodate ROM  '[]' accommodate tone '[]' improve visual orientation  '[x]'  Anterior chest strap  '[]'  Vest '[]'  Shoulder retractors  '[]' decrease forward movement of shoulder '[]' accommodation of TLSO '[x]' decrease forward movement of trunk '[]' decrease shoulder elevation '[]' added abdominal support '[x]' alignment '[]' assistance with shoulder control  '[]'  ?????  Pelvic Positioner '[x]' Hip Belt 4-point, Center-Pull with 46cm pads '[]' Sub ASIS bar '[]' Dual Pull '[x]' stabilize tone '[x]' decrease falling out of chair/ **will not Decr potential for sliding due to pelvic  tilting '[]' prevent excessive rotation '[x]' pad for protection over boney prominence '[]' prominence comfort '[]' special pull angle to control rotation '[]'  ?????  Upper Extremity Support '[]' L '[]'  R '[]' Arm trough    '[]' hand support '[]'  tray       '[x]' full tray '[]' swivel mount '[]' decrease edema      '[]' decrease subluxation   '[]' control tone   '[]' placement for AAC/Computer/EADL '[]' decrease gravitational pull on shoulders '[x]' provide midline positioning '[x]' provide support to increase UE function '[]' provide hand support in natural position '[x]' provide work surface   POWER WHEELCHAIR CONTROLS  '[x]' Proportional  '[]' Non-Proportional Type Joystick '[x]' Left  '[]' Right '[x]' provides access for controlling wheelchair   '[]' lacks motor control to operate proportional drive control '[]' unable to understand proportional controls  Actuator Control Module  '[]' Single  '[x]' Multiple   '[x]' Allow the client to operate the power seat function(s) through the joystick control   '[]' Safety Reset Switches '[]' Used to change modes and stop the wheelchair when driving in latch mode    '[x]' Upgraded Electronics   '[x]' programming for accurate control '[]' progressive Disease/changing condition '[]' non-proportional drive control needed '[x]' Needed in order to operate power seat functions through joystick control   '[]' Display box '[]' Allows user to see in which  mode and drive the wheelchair is set  '[]' necessary for alternate controls    '[]' Digital interface electronics '[]' Allows w/c to operate when using alternative drive controls  '[]' ASL Head Array '[]' Allows client to operate wheelchair  through switches placed in tri-panel headrest  '[]' Sip and puff with tubing kit '[]' needed to operate sip and puff drive controls  '[]' Upgraded tracking electronics '[]' increase safety when driving '[]' correct tracking when on uneven surfaces  '[x]' Mount for switches or joystick '[x]' Attaches switches to w/c  '[x]' Swing away for access or transfers '[]' midline for optimal placement '[]' provides for  consistent access  '[]' Attendant controlled joystick plus mount '[]' safety '[]' long distance driving '[]' operation of seat functions '[]' compliance with transportation regulations '[]'  ?????    Rear wheel placement/Axle adjustability '[]' None '[]' semi adjustable '[]' fully adjustable  '[]' improved UE access to wheels '[]' improved stability '[]' changing angle in space for improvement of postural stability '[]' 1-arm drive access '[]' amputee pad placement '[]'  ?????  Wheel rims/ hand rims  '[]' metal  '[]' plastic coated '[]' oblique projections '[]' vertical projections '[]' Provide ability to propel manual wheelchair  '[]'  Increase self-propulsion with hand weakness/decreased grasp  Push handles '[]' extended  '[]' angle adjustable  '[]' standard '[]' caregiver access '[]' caregiver assist '[]' allows "hooking" to enable increased ability to perform ADLs or maintain balance  One armed device  '[]' Lt   '[]' Rt '[]' enable propulsion of manual wheelchair with one arm   '[]'  ?????   Brake/wheel lock extension '[]'  Lt   '[]'  Rt '[]' increase indep in applying wheel locks   '[]' Side guards '[]' prevent clothing getting caught in wheel or becoming soiled '[]'  prevent skin tears/abrasions  Battery: Group 24 x 2 '[x]' to power wheelchair ?????  Other: Power Adjustable Seat Height   Multiple Seat Function Control Kit and Switches  To raise and lower seating system to reach items in home and at work  Switches to change between drive functions and power seat functions - pt unable to use toggles  The above equipment has a life- long use expectancy. Growth and changes in medical and/or functional conditions would be the exceptions. This is to certify that the therapist has no financial relationship with durable medical provider or manufacturer. The therapist will not receive remuneration of any kind for the equipment recommended in this evaluation.   Patient has mobility limitation that significantly impairs safe, timely participation in one or more mobility related ADL's.  (bathing,  toileting, feeding, dressing, grooming, moving from room to room)                                                             '[x]'  Yes '[]'  No Will mobility device sufficiently improve ability to participate and/or be aided in participation of MRADL's?         '[x]'  Yes '[]'  No Can limitation be compensated for with use of a cane or walker?                                                                                '[]'  Yes '[x]'  No Does patient or caregiver demonstrate ability/potential ability &  willingness to safely use the mobility device?   '[x]'  Yes '[]'  No Does patient's home environment support use of recommended mobility device?                                                    '[x]'  Yes '[]'  No Does patient have sufficient upper extremity function necessary to functionally propel a manual wheelchair?    '[]'  Yes '[x]'  No Does patient have sufficient strength and trunk stability to safely operate a POV (scooter)?                                  '[]'  Yes '[x]'  No Does patient need additional features/benefits provided by a power wheelchair for MRADL's in the home?       '[x]'  Yes '[]'  No Does the patient demonstrate the ability to safely use a power wheelchair?                                                              '[x]'  Yes '[]'  No  Therapist Name Printed: Rico Junker, PT, DPT Date: 03/02/19  Therapist's Signature:   Date:   Supplier's Name Printed: [employer] Date: 03/02/19  Supplier's Signature:   Date:  Patient/Caregiver Signature:   Date:     This is to certify that I have read this evaluation and do agree with the content within:      Physician's Name Printed: Chesley Noon, MD  60 Signature:  Date:     This is to certify that I, the above signed therapist have the following affiliations: '[]'  This DME provider '[]'  Manufacturer of recommended equipment '[]'  Patient's long term care facility '[x]'  None of the above       Objective measurements completed on examination: See  above findings.              PT Education - 03/03/19 1034    Education Details  Education regarding options for power wheelchairs: front wheel drive vs. mid wheel drive; pt to keep current seating system but will be transferred to new power base.  Process for obtaining new wheelchair.    Person(s) Educated  Patient;Caregiver(s)    Methods  Explanation    Comprehension  Verbalized understanding                  Plan - 03/03/19 1038    Clinical Impression Statement  Pt is a 48 year old male referred to Neuro [facility] for evaluation for new power mobility base due to current power wheelchair is >83 years old.  Pt has new custom molded seat and back provided to patient in May.  Current seating system to be transferred to new chair.  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  Pt requires the  use of a power wheelchair with power seating functions to provide accommodation for strength, ROM and balance impairments, effective pressure relief and edema management and to maintain independent mobility and participation in MRADLs.    Personal Factors and Comorbidities  Comorbidity 3+;Finances;Past/Current Experience;Profession;Social Background    Comorbidities  Type 2 DM, cellulitis and edema of bilat LE, PNA, SIRS, tachycardia, sepsis, HTN, OSA, venous insufficiency, depression with anxiety, asthma, dysphagia    Examination-Activity Limitations  Bathing;Dressing;Hygiene/Grooming;Locomotion Level;Toileting;Transfers    Examination-Participation Restrictions  Community Activity    Stability/Clinical Decision Making  Evolving/Moderate complexity    Clinical Decision Making  Moderate     Rehab Potential  Good    PT Frequency  One time visit    PT Duration  Other (comment)   one time visit for wheelchair evaluation only   Consulted and Agree with Plan of Care  Patient;Family member/caregiver    Family Member Consulted  Caregiver Aaron Edelman       Patient will benefit from skilled therapeutic intervention in order to improve the following deficits and impairments:  Cardiopulmonary status limiting activity, Decreased balance, Decreased coordination, Decreased range of motion, Decreased strength, Impaired UE functional use, Postural dysfunction  Visit Diagnosis: Spastic quadriplegic cerebral palsy (HCC)  Stiffness of right knee, not elsewhere classified  Stiffness of left knee, not elsewhere classified  Abnormal posture     Problem List Patient Active Problem List   Diagnosis Date Noted  . Hiatal hernia   . Cellulitis of right lower extremity without foot 08/03/2017  . Type 2 diabetes mellitus with complication, without long-term current use of insulin ([place]) 08/03/2017  . PNA (pneumonia) 05/07/2017  . SIRS (systemic inflammatory response syndrome) (HCC)   . Acute respiratory failure with hypoxia ([place]) 03/13/2017  . 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[place])   . Rhinovirus infection 08/17/2016  . Pressure injury of skin 08/16/2016  . Acute respiratory distress 08/15/2016  . Tachycardia 08/15/2016  . Sepsis ([place]) 08/15/2016  . Onychomycosis 02/20/2016  . Essential hypertension   . OSA (obstructive sleep apnea) 12/11/2015  . Chronic diastolic (congestive) heart failure ([place])   . Chronic venous insufficiency   . Depression with anxiety 09/20/2015  . CAP (community acquired pneumonia) 09/20/2015  . Hypersomnia 07/16/2015  . Asthma exacerbation 07/16/2015  . GERD (gastroesophageal reflux disease) 07/16/2015  . Snoring 07/16/2015  . Hematemesis with nausea   . Fever 05/24/2014  . Hyperlipidemia 02/22/2012  . Dysphagia 09/16/2010  . 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[place])  09/16/2010   Rico Junker, PT, DPT 03/03/19    10:46 AM    [facility] [address] [place], [place], [address] Phone: [phone]   Fax:  [phone]  Name: Thomas Ramos MRN: [id number] Date of Birth: May 10, 1970

## 2019-03-05 NOTE — Progress Notes (Signed)
Subjective: Thomas Ramos is seen today for preventative diabetic foot care follow up painful, elongated, thickened toenails bilateral feet that he cannot cut. Pain interferes with daily activities. Aggravating factor includes wearing enclosed shoe gear and relieved with periodic debridement.  He is accompanied by his caregiver on today's visit.   He still utlizes his motorized chair and states he needs another prescription for gel pads to be added to his footplates to relieve pressure.  Medications reviewed in chart.  Allergies  Allergen Reactions  . Penicillins Hives, Nausea And Vomiting and Other (See Comments)    Patient tolerated cefazolin in 2017 Has patient had a PCN reaction causing immediate rash, facial/tongue/throat swelling, SOB or lightheadedness with hypotension: Yes Has patient had a PCN reaction causing severe rash involving mucus membranes or skin necrosis: No Has patient had a PCN reaction that required hospitalization No Has patient had a PCN reaction occurring within the last 10 years: Yes If all of the above answers are "NO", then may proceed with Cephalosporin use.  . Sulfamethoxazole-Trimethoprim Nausea And Vomiting  . Metronidazole Nausea And Vomiting     Objective:  Vascular Examination: Capillary refill time <3 seconds b/l.   Dorsalis pedis present b/l.  Posterior tibial pulses present b/l.  Digital hair absent b/l.   Chronic LE edema due to quadriplegia. No pain on calf compression. No increased warmth, breaks in skin, no evidence of wounds.  Skin temperature gradient WNL b/l.   Dermatological Examination: Skin with normal turgor, texture and tone b/l.  Toenails 1-5 b/l discolored, thick, dystrophic with subungual debris and pain with palpation to nailbeds due to thickness of nails.  Musculoskeletal: Flaccid LE b/l.   No pain, crepitus or joint limitation noted with ROM.   Neurological Examination: Protective sensation intact 5/5  with 10  gram monofilament bilaterally.  Assessment: Painful onychomycosis toenails 1-5 b/l  NIDDM  Plan: 1. As requested, Rx written for gel pads to be added to footpads of his motorized chair. 2. Toenails 1-5 b/l were debrided in length and girth without iatrogenic bleeding. 3. Patient to continue soft, supportive shoe gear daily. 4. Patient to report any pedal injuries to medical professional immediately. 5. Follow up 3 months.  6. Patient/POA to call should there be a concern in the interim.

## 2019-03-21 ENCOUNTER — Inpatient Hospital Stay (HOSPITAL_COMMUNITY)
Admission: EM | Admit: 2019-03-21 | Discharge: 2019-03-25 | DRG: 871 | Disposition: A | Discharge status: Home / Self Care | Payer: Medicare Other | Attending: Family Medicine | Admitting: Family Medicine

## 2019-03-21 ENCOUNTER — Encounter (HOSPITAL_COMMUNITY): Payer: Self-pay

## 2019-03-21 ENCOUNTER — Other Ambulatory Visit: Payer: Self-pay

## 2019-03-21 ENCOUNTER — Emergency Department (HOSPITAL_COMMUNITY): Payer: Medicare Other

## 2019-03-21 DIAGNOSIS — J45909 Unspecified asthma, uncomplicated: Secondary | ICD-10-CM | POA: Diagnosis present

## 2019-03-21 DIAGNOSIS — Z833 Family history of diabetes mellitus: Secondary | ICD-10-CM

## 2019-03-21 DIAGNOSIS — Z87891 Personal history of nicotine dependence: Secondary | ICD-10-CM

## 2019-03-21 DIAGNOSIS — Z8249 Family history of ischemic heart disease and other diseases of the circulatory system: Secondary | ICD-10-CM | POA: Diagnosis not present

## 2019-03-21 DIAGNOSIS — J1282 Pneumonia due to coronavirus disease 2019: Secondary | ICD-10-CM | POA: Diagnosis present

## 2019-03-21 DIAGNOSIS — D509 Iron deficiency anemia, unspecified: Secondary | ICD-10-CM | POA: Diagnosis not present

## 2019-03-21 DIAGNOSIS — I10 Essential (primary) hypertension: Secondary | ICD-10-CM | POA: Diagnosis not present

## 2019-03-21 DIAGNOSIS — G8250 Quadriplegia, unspecified: Secondary | ICD-10-CM | POA: Diagnosis present

## 2019-03-21 DIAGNOSIS — F419 Anxiety disorder, unspecified: Secondary | ICD-10-CM | POA: Diagnosis present

## 2019-03-21 DIAGNOSIS — Z79899 Other long term (current) drug therapy: Secondary | ICD-10-CM

## 2019-03-21 DIAGNOSIS — I110 Hypertensive heart disease with heart failure: Secondary | ICD-10-CM | POA: Diagnosis present

## 2019-03-21 DIAGNOSIS — E118 Type 2 diabetes mellitus with unspecified complications: Secondary | ICD-10-CM | POA: Diagnosis present

## 2019-03-21 DIAGNOSIS — Z882 Allergy status to sulfonamides status: Secondary | ICD-10-CM

## 2019-03-21 DIAGNOSIS — R000 Tachycardia, unspecified: Secondary | ICD-10-CM | POA: Diagnosis present

## 2019-03-21 DIAGNOSIS — I5032 Chronic diastolic (congestive) heart failure: Secondary | ICD-10-CM | POA: Diagnosis present

## 2019-03-21 DIAGNOSIS — A419 Sepsis, unspecified organism: Secondary | ICD-10-CM | POA: Diagnosis present

## 2019-03-21 DIAGNOSIS — L89151 Pressure ulcer of sacral region, stage 1: Secondary | ICD-10-CM | POA: Diagnosis present

## 2019-03-21 DIAGNOSIS — Z7982 Long term (current) use of aspirin: Secondary | ICD-10-CM

## 2019-03-21 DIAGNOSIS — Z888 Allergy status to other drugs, medicaments and biological substances status: Secondary | ICD-10-CM

## 2019-03-21 DIAGNOSIS — Z6831 Body mass index (BMI) 31.0-31.9, adult: Secondary | ICD-10-CM

## 2019-03-21 DIAGNOSIS — Z801 Family history of malignant neoplasm of trachea, bronchus and lung: Secondary | ICD-10-CM

## 2019-03-21 DIAGNOSIS — Z9981 Dependence on supplemental oxygen: Secondary | ICD-10-CM | POA: Diagnosis not present

## 2019-03-21 DIAGNOSIS — G809 Cerebral palsy, unspecified: Secondary | ICD-10-CM | POA: Diagnosis present

## 2019-03-21 DIAGNOSIS — G4733 Obstructive sleep apnea (adult) (pediatric): Secondary | ICD-10-CM | POA: Diagnosis present

## 2019-03-21 DIAGNOSIS — E669 Obesity, unspecified: Secondary | ICD-10-CM | POA: Diagnosis present

## 2019-03-21 DIAGNOSIS — K219 Gastro-esophageal reflux disease without esophagitis: Secondary | ICD-10-CM | POA: Diagnosis present

## 2019-03-21 DIAGNOSIS — Z880 Allergy status to penicillin: Secondary | ICD-10-CM

## 2019-03-21 DIAGNOSIS — A4189 Other specified sepsis: Secondary | ICD-10-CM | POA: Diagnosis present

## 2019-03-21 DIAGNOSIS — Z7984 Long term (current) use of oral hypoglycemic drugs: Secondary | ICD-10-CM

## 2019-03-21 DIAGNOSIS — U071 COVID-19: Secondary | ICD-10-CM | POA: Diagnosis present

## 2019-03-21 DIAGNOSIS — E785 Hyperlipidemia, unspecified: Secondary | ICD-10-CM | POA: Diagnosis present

## 2019-03-21 DIAGNOSIS — J9601 Acute respiratory failure with hypoxia: Secondary | ICD-10-CM | POA: Diagnosis present

## 2019-03-21 DIAGNOSIS — Z7951 Long term (current) use of inhaled steroids: Secondary | ICD-10-CM

## 2019-03-21 DIAGNOSIS — L8990 Pressure ulcer of unspecified site, unspecified stage: Secondary | ICD-10-CM | POA: Diagnosis present

## 2019-03-21 LAB — COMPREHENSIVE METABOLIC PANEL
ALT: 33 U/L (ref 0–44)
AST: 36 U/L (ref 15–41)
Albumin: 4.4 g/dL (ref 3.5–5.0)
Alkaline Phosphatase: 120 U/L (ref 38–126)
Anion gap: 11 (ref 5–15)
BUN: 16 mg/dL (ref 6–20)
CO2: 23 mmol/L (ref 22–32)
Calcium: 8.8 mg/dL — ABNORMAL LOW (ref 8.9–10.3)
Chloride: 103 mmol/L (ref 98–111)
Creatinine, Ser: 0.8 mg/dL (ref 0.61–1.24)
GFR calc Af Amer: >60 mL/min (ref >60)
GFR calc non Af Amer: >60 mL/min (ref >60)
Glucose, Bld: 123 mg/dL — ABNORMAL HIGH (ref 70–99)
Potassium: 4 mmol/L (ref 3.5–5.1)
Sodium: 137 mmol/L (ref 135–145)
Total Bilirubin: 0.7 mg/dL (ref 0.3–1.2)
Total Protein: 7.2 g/dL (ref 6.5–8.1)

## 2019-03-21 LAB — URINALYSIS, ROUTINE W REFLEX MICROSCOPIC
Bacteria, UA: NONE SEEN
Bilirubin Urine: NEGATIVE
Glucose, UA: >=500 mg/dL — AB
Hgb urine dipstick: NEGATIVE
Ketones, ur: 5 mg/dL — AB
Leukocytes,Ua: NEGATIVE
Nitrite: NEGATIVE
Protein, ur: NEGATIVE mg/dL
Specific Gravity, Urine: 1.028 (ref 1.005–1.030)
pH: 5 (ref 5.0–8.0)

## 2019-03-21 LAB — CBC WITH DIFFERENTIAL/PLATELET
Abs Immature Granulocytes: 0.05 10*3/uL (ref 0.00–0.07)
Basophils Absolute: 0 10*3/uL (ref 0.0–0.1)
Basophils Relative: 1 %
Eosinophils Absolute: 0.1 10*3/uL (ref 0.0–0.5)
Eosinophils Relative: 1 %
HCT: 40.6 % (ref 39.0–52.0)
Hemoglobin: 13 g/dL (ref 13.0–17.0)
Immature Granulocytes: 1 %
Lymphocytes Relative: 9 %
Lymphs Abs: 0.6 10*3/uL — ABNORMAL LOW (ref 0.7–4.0)
MCH: 25.7 pg — ABNORMAL LOW (ref 26.0–34.0)
MCHC: 32 g/dL (ref 30.0–36.0)
MCV: 80.4 fL (ref 80.0–100.0)
Monocytes Absolute: 0.6 10*3/uL (ref 0.1–1.0)
Monocytes Relative: 10 %
Neutro Abs: 4.7 10*3/uL (ref 1.7–7.7)
Neutrophils Relative %: 78 %
Platelets: 179 10*3/uL (ref 150–400)
RBC: 5.05 MIL/uL (ref 4.22–5.81)
RDW: 13.6 % (ref 11.5–15.5)
WBC: 6 10*3/uL (ref 4.0–10.5)
nRBC: 0 % (ref 0.0–0.2)

## 2019-03-21 LAB — CBC
HCT: 39.3 % (ref 39.0–52.0)
Hemoglobin: 12 g/dL — ABNORMAL LOW (ref 13.0–17.0)
MCH: 25.3 pg — ABNORMAL LOW (ref 26.0–34.0)
MCHC: 30.5 g/dL (ref 30.0–36.0)
MCV: 82.7 fL (ref 80.0–100.0)
Platelets: 161 10*3/uL (ref 150–400)
RBC: 4.75 MIL/uL (ref 4.22–5.81)
RDW: 13.7 % (ref 11.5–15.5)
WBC: 5.5 10*3/uL (ref 4.0–10.5)
nRBC: 0 % (ref 0.0–0.2)

## 2019-03-21 LAB — PROTIME-INR
INR: 1.1 (ref 0.8–1.2)
Prothrombin Time: 13.7 seconds (ref 11.4–15.2)

## 2019-03-21 LAB — ABO/RH: ABO/RH(D): O POS

## 2019-03-21 LAB — CREATININE, SERUM
Creatinine, Ser: 0.85 mg/dL (ref 0.61–1.24)
GFR calc Af Amer: >60 mL/min (ref >60)
GFR calc non Af Amer: >60 mL/min (ref >60)

## 2019-03-21 LAB — FIBRINOGEN: Fibrinogen: 318 mg/dL (ref 210–475)

## 2019-03-21 LAB — HEMOGLOBIN A1C
Hgb A1c MFr Bld: 7.4 % — ABNORMAL HIGH (ref 4.8–5.6)
Mean Plasma Glucose: 165.68 mg/dL

## 2019-03-21 LAB — POC SARS CORONAVIRUS 2 AG -  ED: SARS Coronavirus 2 Ag: POSITIVE — AB

## 2019-03-21 LAB — C-REACTIVE PROTEIN: CRP: 3.3 mg/dL — ABNORMAL HIGH (ref <1.0)

## 2019-03-21 LAB — D-DIMER, QUANTITATIVE: D-Dimer, Quant: 0.35 ug/mL-FEU (ref 0.00–0.50)

## 2019-03-21 LAB — LACTIC ACID, PLASMA
Lactic Acid, Venous: 2.2 mmol/L (ref 0.5–1.9)
Lactic Acid, Venous: 1.9 mmol/L (ref 0.5–1.9)

## 2019-03-21 LAB — TRIGLYCERIDES: Triglycerides: 137 mg/dL (ref <150)

## 2019-03-21 LAB — PROCALCITONIN: Procalcitonin: <0.1 ng/mL

## 2019-03-21 LAB — GLUCOSE, CAPILLARY: Glucose-Capillary: 249 mg/dL — ABNORMAL HIGH (ref 70–99)

## 2019-03-21 LAB — FERRITIN: Ferritin: 13 ng/mL — ABNORMAL LOW (ref 24–336)

## 2019-03-21 LAB — LACTATE DEHYDROGENASE: LDH: 182 U/L (ref 98–192)

## 2019-03-21 MED ORDER — ALBUTEROL SULFATE HFA 108 (90 BASE) MCG/ACT IN AERS
1.0000 | INHALATION_SPRAY | RESPIRATORY_TRACT | Status: DC | PRN
  Filled 2019-03-21: qty 6.7

## 2019-03-21 MED ORDER — SODIUM CHLORIDE 0.9 % IV SOLN
200.0000 mg | Freq: Once | INTRAVENOUS | Status: AC
  Administered 2019-03-21: 200 mg via INTRAVENOUS
  Filled 2019-03-21: qty 200

## 2019-03-21 MED ORDER — SODIUM CHLORIDE 0.9 % IV SOLN
100.0000 mg | Freq: Every day | INTRAVENOUS | Status: AC
  Administered 2019-03-22 – 2019-03-25 (×4): 100 mg via INTRAVENOUS
  Filled 2019-03-21 (×4): qty 20

## 2019-03-21 MED ORDER — INSULIN ASPART 100 UNIT/ML ~~LOC~~ SOLN
0.0000 [IU] | Freq: Three times a day (TID) | SUBCUTANEOUS | Status: DC
  Administered 2019-03-22: 2 [IU] via SUBCUTANEOUS
  Administered 2019-03-22 – 2019-03-23 (×3): 3 [IU] via SUBCUTANEOUS
  Administered 2019-03-23: 5 [IU] via SUBCUTANEOUS
  Administered 2019-03-23: 2 [IU] via SUBCUTANEOUS
  Administered 2019-03-24: 5 [IU] via SUBCUTANEOUS
  Administered 2019-03-24 – 2019-03-25 (×3): 2 [IU] via SUBCUTANEOUS
  Filled 2019-03-21: qty 0.15

## 2019-03-21 MED ORDER — MONTELUKAST SODIUM 10 MG PO TABS
10.0000 mg | ORAL_TABLET | Freq: Every day | ORAL | Status: DC
  Administered 2019-03-22 – 2019-03-25 (×4): 10 mg via ORAL
  Filled 2019-03-21 (×4): qty 1

## 2019-03-21 MED ORDER — DEXAMETHASONE SODIUM PHOSPHATE 10 MG/ML IJ SOLN
6.0000 mg | INTRAMUSCULAR | Status: DC
  Administered 2019-03-21 – 2019-03-24 (×4): 6 mg via INTRAVENOUS
  Filled 2019-03-21 (×4): qty 1

## 2019-03-21 MED ORDER — PANTOPRAZOLE SODIUM 40 MG PO TBEC
40.0000 mg | DELAYED_RELEASE_TABLET | Freq: Every day | ORAL | Status: DC
  Administered 2019-03-22 – 2019-03-25 (×4): 40 mg via ORAL
  Filled 2019-03-21 (×4): qty 1

## 2019-03-21 MED ORDER — IRBESARTAN 75 MG PO TABS
37.5000 mg | ORAL_TABLET | Freq: Every day | ORAL | Status: DC
  Administered 2019-03-22 – 2019-03-23 (×2): 37.5 mg via ORAL
  Filled 2019-03-21 (×3): qty 0.5

## 2019-03-21 MED ORDER — KETOROLAC TROMETHAMINE 30 MG/ML IJ SOLN
15.0000 mg | Freq: Once | INTRAMUSCULAR | Status: AC
  Administered 2019-03-21: 15 mg via INTRAVENOUS
  Filled 2019-03-21: qty 1

## 2019-03-21 MED ORDER — BACLOFEN 5 MG HALF TABLET
5.0000 mg | ORAL_TABLET | Freq: Every day | ORAL | Status: DC
  Administered 2019-03-22 – 2019-03-24 (×4): 5 mg via ORAL
  Filled 2019-03-21 (×6): qty 1

## 2019-03-21 MED ORDER — ACETAMINOPHEN 500 MG PO TABS
1000.0000 mg | ORAL_TABLET | Freq: Once | ORAL | Status: AC
  Administered 2019-03-21: 1000 mg via ORAL
  Filled 2019-03-21: qty 2

## 2019-03-21 MED ORDER — HYDROCOD POLST-CPM POLST ER 10-8 MG/5ML PO SUER: 5.0000 mL | Freq: Two times a day (BID) | ORAL | Status: DC | PRN

## 2019-03-21 MED ORDER — ROSUVASTATIN CALCIUM 5 MG PO TABS
5.0000 mg | ORAL_TABLET | Freq: Every day | ORAL | Status: DC
  Administered 2019-03-22 – 2019-03-24 (×4): 5 mg via ORAL
  Filled 2019-03-21 (×5): qty 1

## 2019-03-21 MED ORDER — NITROGLYCERIN 0.4 MG SL SUBL: 0.4000 mg | SUBLINGUAL_TABLET | SUBLINGUAL | Status: DC | PRN

## 2019-03-21 MED ORDER — SODIUM CHLORIDE 0.9 % IV BOLUS
1000.0000 mL | Freq: Once | INTRAVENOUS | Status: AC
  Administered 2019-03-21: 1000 mL via INTRAVENOUS

## 2019-03-21 MED ORDER — LATANOPROST 0.005 % OP SOLN
1.0000 [drp] | Freq: Every day | OPHTHALMIC | Status: DC | PRN
  Filled 2019-03-21: qty 2.5

## 2019-03-21 MED ORDER — ASCORBIC ACID 500 MG PO TABS
500.0000 mg | ORAL_TABLET | Freq: Every day | ORAL | Status: DC
  Administered 2019-03-21 – 2019-03-25 (×5): 500 mg via ORAL
  Filled 2019-03-21 (×5): qty 1

## 2019-03-21 MED ORDER — ZINC SULFATE 220 (50 ZN) MG PO CAPS
220.0000 mg | ORAL_CAPSULE | Freq: Every day | ORAL | Status: DC
  Administered 2019-03-21 – 2019-03-25 (×5): 220 mg via ORAL
  Filled 2019-03-21 (×5): qty 1

## 2019-03-21 MED ORDER — INSULIN ASPART 100 UNIT/ML ~~LOC~~ SOLN
0.0000 [IU] | Freq: Every day | SUBCUTANEOUS | Status: DC
  Administered 2019-03-22: 3 [IU] via SUBCUTANEOUS
  Administered 2019-03-24: 2 [IU] via SUBCUTANEOUS
  Filled 2019-03-21: qty 0.05

## 2019-03-21 MED ORDER — GUAIFENESIN-DM 100-10 MG/5ML PO SYRP: 10.0000 mL | ORAL_SOLUTION | ORAL | Status: DC | PRN

## 2019-03-21 MED ORDER — ENOXAPARIN SODIUM 40 MG/0.4ML ~~LOC~~ SOLN
40.0000 mg | SUBCUTANEOUS | Status: DC
  Administered 2019-03-22 – 2019-03-24 (×4): 40 mg via SUBCUTANEOUS
  Filled 2019-03-21 (×4): qty 0.4

## 2019-03-21 MED ORDER — AMLODIPINE BESYLATE 10 MG PO TABS
10.0000 mg | ORAL_TABLET | Freq: Every day | ORAL | Status: DC
  Administered 2019-03-22 – 2019-03-23 (×2): 10 mg via ORAL
  Filled 2019-03-21 (×3): qty 1

## 2019-03-21 NOTE — ED Provider Notes (Signed)
  Physical Exam  BP 124/74   Pulse 82   Temp (!) 104.2 F (40.1 C) (Skin)   Resp (!) 24   Wt 86.2 kg   SpO2 99%   BMI 31.62 kg/m   Physical Exam  ED Course/Procedures     Procedures  MDM  Received patient in signout.  Cerebral palsy with shortness of breath.  Fever up to 104 rectally.  Patient is on 10 L of oxygen at this time.  Covid test came back positive.  Requiring more than 4 L of options for likely candidate for [facility].  Will discuss with hospitalist       Davonna Belling, MD 03/21/19 [phone]

## 2019-03-21 NOTE — ED Notes (Signed)
Patient provided with a sandwich and coke per request.

## 2019-03-21 NOTE — ED Notes (Signed)
Patient given bed bath and linens changed.

## 2019-03-21 NOTE — ED Provider Notes (Signed)
[facility] DEPT Provider Note   CSN: [id number] Arrival date & time: 03/21/19  1236     History Chief Complaint  Patient presents with  . Cough  . Shortness of Breath    Thomas Ramos is a 49 y.o. male.  49 yo M with a cc of cough.  Going on since yesterday.  Some SOB with this.  No noted fevers at home.    The history is provided by the patient.  Cough Associated symptoms: shortness of breath   Associated symptoms: no chest pain, no chills, no eye discharge, no fever, no headaches, no myalgias and no rash   Shortness of Breath Associated symptoms: cough   Associated symptoms: no abdominal pain, no chest pain, no fever, no headaches, no rash and no vomiting   Illness Severity:  Moderate Onset quality:  Sudden Timing:  Constant Progression:  Worsening Chronicity:  New Associated symptoms: cough and shortness of breath   Associated symptoms: no abdominal pain, no chest pain, no congestion, no diarrhea, no fever, no headaches, no myalgias, no rash and no vomiting        Past Medical History:  Diagnosis Date  . Anxiety   . Cellulitis 08/04/2017  . Cerebral palsy ([place])   . Chronic diastolic (congestive) heart failure ([place])   . CP (cerebral palsy) ([place])   . Diabetes mellitus without complication (HCC)    borderline  . Environmental allergies    takes inhalers if needed  . Esophageal stricture   . GERD (gastroesophageal reflux disease)   . Hypertension   . Motility disorder, esophageal   . Pneumonia   . 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 S/P Botox injection    approx every 4 months  . Seasonal allergies     Patient Active Problem List   Diagnosis Date Noted  . Hiatal hernia   . Cellulitis of right lower extremity without foot 08/03/2017  . Type 2 diabetes mellitus with complication, without long-term current use of insulin ([place]) 08/03/2017  . PNA (pneumonia) 05/07/2017  . SIRS (systemic inflammatory response syndrome) (HCC)    . Acute respiratory failure with hypoxia ([place]) 03/13/2017  . 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 Rhinovirus infection 08/17/2016  . Pressure injury of skin 08/16/2016  . Acute respiratory distress 08/15/2016  . Tachycardia 08/15/2016  . Sepsis ([place]) 08/15/2016  . Onychomycosis 02/20/2016  . Essential hypertension   . OSA (obstructive sleep apnea) 12/11/2015  . Chronic diastolic (congestive) heart failure ([place])   . Chronic venous insufficiency   . Depression with anxiety 09/20/2015  . CAP (community acquired pneumonia) 09/20/2015  . Hypersomnia 07/16/2015  . Asthma exacerbation 07/16/2015  . GERD (gastroesophageal reflux disease) 07/16/2015  . Snoring 07/16/2015  . Hematemesis with nausea   . Fever 05/24/2014  . Hyperlipidemia 02/22/2012  . Dysphagia 09/16/2010  . 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 BOTOX INJECTION N/A 12/21/2012   Procedure: BOTOX INJECTION;  Surgeon: Inda Castle, MD;  Location: [facility];  Service: Endoscopy;  Laterality: N/A;  . BOTOX INJECTION N/A 06/28/2014   Procedure: BOTOX INJECTION;  Surgeon: Inda Castle, MD;  Location: [facility];  Service: Endoscopy;  Laterality: N/A;  . ESOPHAGOGASTRODUODENOSCOPY N/A 12/21/2012   Procedure: ESOPHAGOGASTRODUODENOSCOPY (EGD);  Surgeon: Inda Castle, MD;  Location: [facility];  Service: Endoscopy;  Laterality: N/A;  . ESOPHAGOGASTRODUODENOSCOPY N/A 06/28/2014   Procedure: ESOPHAGOGASTRODUODENOSCOPY (EGD);  Surgeon: Inda Castle, MD;  Location: [facility];  Service: Endoscopy;  Laterality: N/A;  . ESOPHAGOGASTRODUODENOSCOPY (EGD) WITH PROPOFOL N/A 09/03/2017   Procedure: ESOPHAGOGASTRODUODENOSCOPY (EGD) WITH PROPOFOL;  Surgeon: Doran Stabler, MD;  Location: [facility];  Service: Gastroenterology;  Laterality: N/A;  . ESOPHAGUS SURGERY     stretched esophagus  . legs    . MOUTH SURGERY    . TENDON RELEASE         Family  History  Problem Relation Age of Onset  . Hypertension Father   . Lung cancer Father   . Diabetes Mother     Social History   Tobacco Use  . Smoking status: Never Smoker  . Smokeless tobacco: Former Systems developer  . Tobacco comment: smoked one time  Substance Use Topics  . Alcohol use: Yes    Comment: 2 beers every other Friday and Saturday  . Drug use: No    Home Medications Prior to Admission medications   Medication Sig Start Date End Date Taking? Authorizing Provider  amLODipine (NORVASC) 10 MG tablet Take 10 mg by mouth daily.    [provider]  aspirin 81 MG chewable tablet Chew by mouth daily.    [provider]  baclofen (LIORESAL) 10 MG tablet Take 1/2 tablet (5 mg dose) by MOUTH AT bedtime. 06/01/18   [provider]  budesonide-formoterol (SYMBICORT) 80-4.5 MCG/ACT inhaler Inhale 2 puffs into the lungs 2 (two) times daily.    [provider]  [facility] CLOTRIMAZOLE 1 % cream Apply 1 application topically 2 (two) times daily as needed (rash). BETWEEN TOES 08/19/17   [provider]  empagliflozin (JARDIANCE) 10 MG TABS tablet Take 10 mg by mouth daily.    [provider]  furosemide (LASIX) 20 MG tablet Take 20 mg by mouth daily. 07/15/17   [provider]  latanoprost (XALATAN) 0.005 % ophthalmic solution Place 1 drop into both eyes daily as needed (dry eyes).     [provider]  metFORMIN (GLUCOPHAGE) 500 MG tablet Take 1,000 mg by mouth at bedtime.     [provider]  metFORMIN (GLUCOPHAGE-XR) 500 MG 24 hr tablet  02/14/19   [provider]  montelukast (SINGULAIR) 10 MG tablet Take 10 mg by mouth daily.  08/12/16   [provider]  mupirocin ointment (BACTROBAN) 2 % Apply to affected as needed for rash 04/30/14   [provider]  naproxen sodium (ALEVE) 220 MG tablet Take 220-440 mg by mouth 2 (two) times daily as needed (for pain).    [provider]  nitroGLYCERIN  (NITROSTAT) 0.4 MG SL tablet Place 0.4 mg under the tongue every 5 (five) minutes as needed for chest pain.     [provider]  omeprazole (PRILOSEC) 20 MG capsule Take 20 mg by mouth daily.    [provider]  ondansetron (ZOFRAN) 4 MG tablet Take 1 tablet (4 mg total) by mouth every 6 (six) hours. Patient taking differently: Take 4 mg by mouth every 6 (six) hours as needed for nausea or vomiting.  02/05/17   Orpah Greek, MD  polyethylene glycol powder (GLYCOLAX/MIRALAX) 17 GM/SCOOP powder Take by mouth.    [provider]  potassium chloride (K-DUR) 10 MEQ tablet Take 10 mEq by mouth daily.    [provider]  rosuvastatin (CRESTOR) 5 MG tablet Take 5 mg by mouth at bedtime.    [provider]  valsartan (DIOVAN) 80 MG tablet Take 80 mg by mouth daily.    [provider]    Allergies    Penicillins, Sulfamethoxazole-trimethoprim, and Metronidazole  Review of Systems   Review of Systems  Constitutional: Negative for chills and fever.  HENT: Negative for congestion and facial swelling.   Eyes: Negative for discharge and visual disturbance.  Respiratory: Positive for cough and shortness of breath.   Cardiovascular: Negative for chest pain and palpitations.  Gastrointestinal: Negative for abdominal pain, diarrhea and vomiting.  Musculoskeletal: Negative for arthralgias and myalgias.  Skin: Negative for color change and rash.  Neurological: Negative for tremors, syncope and headaches.  Psychiatric/Behavioral: Negative for confusion and dysphoric mood.    Physical Exam Updated Vital Signs BP 127/73 (BP Location: Left Arm)   Pulse (!) 133   Temp (!) 104.2 F (40.1 C) (Skin)   Resp (S) (!) 38   Wt 86.2 kg   SpO2 98%   BMI 31.62 kg/m   Physical Exam Vitals and nursing note reviewed.  Constitutional:      Appearance: He is well-developed.  HENT:     Head: Normocephalic and atraumatic.  Eyes:     Pupils: Pupils are  equal, round, and reactive to light.  Neck:     Vascular: No JVD.  Cardiovascular:     Rate and Rhythm: Normal rate and regular rhythm.     Heart sounds: No murmur. No friction rub. No gallop.   Pulmonary:     Effort: No respiratory distress.     Breath sounds: Examination of the right-lower field reveals rhonchi. Rhonchi present. No wheezing.  Abdominal:     General: There is no distension.     Tenderness: There is no guarding or rebound.  Musculoskeletal:        General: Normal range of motion.     Cervical back: Normal range of motion and neck supple.     Comments: Contractured extremities.  Skin:    Coloration: Skin is not pale.     Findings: No rash.  Neurological:     Mental Status: He is alert and oriented to person, place, and time.  Psychiatric:        Behavior: Behavior normal.     ED Results / Procedures / Treatments   Labs (all labs ordered are listed, but only abnormal results are displayed) Labs Reviewed  CULTURE, BLOOD (ROUTINE X 2)  CULTURE, BLOOD (ROUTINE X 2)  COMPREHENSIVE METABOLIC PANEL  LACTIC ACID, PLASMA  LACTIC ACID, PLASMA  CBC WITH DIFFERENTIAL/PLATELET  PROTIME-INR  URINALYSIS, ROUTINE W REFLEX MICROSCOPIC  POC SARS CORONAVIRUS 2 AG -  ED    EKG EKG Interpretation  Date/Time:  Tuesday March 21 2019 13:13:53 EST Ventricular Rate:  128 PR Interval:    QRS Duration: 88 QT Interval:  289 QTC Calculation: 422 R Axis:   115 Text Interpretation: Sinus tachycardia Right axis deviation Since last tracing rate faster Otherwise no significant change Confirmed by Deno Etienne [phone]) on 03/21/2019 1:38:20 PM   Radiology No results found.  Procedures Procedures (including critical care time)  Medications Ordered in ED Medications - No data to display  ED Course  I have reviewed the triage vital signs and the nursing notes.  Pertinent labs & imaging results that were available during my care of the patient were reviewed by me and  considered in my medical decision making (see chart for details).    MDM Rules/Calculators/A&P                      49 yo M with a  chief complaints of cough shortness of breath.  Going on for the past 24 hours or so.  History of cerebral palsy.  No known Covid exposure.  Having significant sputum.  Patient is tachycardic and tachypneic though likely corresponding with his rectal temperature of 40 C.  Will give antipyretics and bolus of IV fluids chest x-ray lab work and reassess.  Clinically has RLL pna.  Unfortunately due to his CP and positioning did not provide a optimal x-ray.  His coronavirus test did come back positive however making it less likely that he has bacterial pneumonia.  Likely needs admission.  Signed out to Dr. Alvino Chapel.  CRITICAL CARE Performed by: Cecilio Asper   Total critical care time: 35 minutes  Critical care time was exclusive of separately billable procedures and treating other patients.  Critical care was necessary to treat or prevent imminent or life-threatening deterioration.  Critical care was time spent personally by me on the following activities: development of treatment plan with patient and/or surrogate as well as nursing, discussions with consultants, evaluation of patient's response to treatment, examination of patient, obtaining history from patient or surrogate, ordering and performing treatments and interventions, ordering and review of laboratory studies, ordering and review of radiographic studies, pulse oximetry and re-evaluation of patient's condition.  The patients results and plan were reviewed and discussed.   Any x-rays performed were independently reviewed by myself.   Differential diagnosis were considered with the presenting HPI.  Medications  enoxaparin (LOVENOX) injection 40 mg (40 mg Subcutaneous Given 03/22/19 0017)  remdesivir 200 mg in sodium chloride 0.9% 250 mL IVPB (0 mg Intravenous Stopped 03/21/19 1901)    Followed by    remdesivir 100 mg in sodium chloride 0.9 % 100 mL IVPB (has no administration in time range)  dexamethasone (DECADRON) injection 6 mg (6 mg Intravenous Given 03/21/19 1831)  ascorbic acid (VITAMIN C) tablet 500 mg (500 mg Oral Given 03/21/19 1831)  zinc sulfate capsule 220 mg (220 mg Oral Given 03/21/19 1831)  guaiFENesin-dextromethorphan (ROBITUSSIN DM) 100-10 MG/5ML syrup 10 mL (has no administration in time range)  chlorpheniramine-HYDROcodone (TUSSIONEX) 10-8 MG/5ML suspension 5 mL (has no administration in time range)  insulin aspart (novoLOG) injection 0-15 Units (has no administration in time range)  insulin aspart (novoLOG) injection 0-5 Units (3 Units Subcutaneous Given 03/22/19 0017)  albuterol (VENTOLIN HFA) 108 (90 Base) MCG/ACT inhaler 1-2 puff (has no administration in time range)  amLODipine (NORVASC) tablet 10 mg (has no administration in time range)  baclofen (LIORESAL) tablet 5 mg (5 mg Oral Given 03/22/19 0018)  latanoprost (XALATAN) 0.005 % ophthalmic solution 1 drop (has no administration in time range)  montelukast (SINGULAIR) tablet 10 mg (has no administration in time range)  nitroGLYCERIN (NITROSTAT) SL tablet 0.4 mg (has no administration in time range)  pantoprazole (PROTONIX) EC tablet 40 mg (has no administration in time range)  rosuvastatin (CRESTOR) tablet 5 mg (5 mg Oral Given 03/22/19 0018)  irbesartan (AVAPRO) tablet 37.5 mg (has no administration in time range)  sodium chloride 0.9 % bolus 1,000 mL (0 mLs Intravenous Stopped 03/21/19 1619)  acetaminophen (TYLENOL) tablet 1,000 mg (1,000 mg Oral Given 03/21/19 1454)  ketorolac (TORADOL) 30 MG/ML injection 15 mg (15 mg Intravenous Given 03/21/19 1455)    Vitals:   03/21/19 2122 03/21/19 2300 03/21/19 2350 03/22/19 0356  BP: 118/82 116/89 116/89 131/78  Pulse: 94 95 95   Resp: (!) 22 20 20 18   Temp:  97.9 F (36.6 C) 97.9 F (36.6  C) 98 F (36.7 C)  TempSrc:   Oral   SpO2: 99% 100%  94%  Weight:   86.2 kg   Height:    5\' 5"  (1.651 m)     Final diagnoses:  COVID-19  Acute respiratory failure with hypoxia (HCC)    Admission/ observation were discussed with the admitting physician, patient and/or family and they are comfortable with the plan.   Final Clinical Impression(s) / ED Diagnoses Final diagnoses:  None    Rx / DC Orders ED Discharge Orders    None       Deno Etienne, DO 03/22/19 [phone]

## 2019-03-21 NOTE — ED Triage Notes (Signed)
Pt arrives today coughing with very thick clear mucous. Pt has hx of CP, is following commands. Hx of asthma, no relief with inhaler. EMS's suction cannister was having difficulty sucking up secretions.

## 2019-03-21 NOTE — H&P (Signed)
History and Physical    Thomas Ramos [id number] DOB: February 13, 1971 DOA: 03/21/2019  PCP: Chesley Noon, MD  Patient coming from: Home  Chief Complaint: Cough, sob  HPI: Thomas Ramos is a 49 y.o. male with medical history significant of quadriplegia, CP, HTN, CHF, noninsulin DM2 presents with one day hx of cough and sob associated with thich secretions. Of note, pt has hx of CP thus obtaining full history is difficult. Pt reportedly tried using MDI available to him with little to no effect. Pt lives under care of caregiver . Pt denies abd pain, subjective fevers, headaches  ED Course: In the ED, pt found to have covid ag pos. CXR clear.  Pt presented to the ED with temp of 104.76F with HR in the 130's and RR as high as the 40's. CRP mildly elevated at 3.3 with ferritin of 13. D-dimer of 0.35. Patient has been continued on NRB with improvement in symptoms. Given acute hypoxemic resp failure, hospitalist consulted for consideration for admission  Review of Systems:  Review of Systems  Constitutional: Positive for malaise/fatigue. Negative for chills and fever.  HENT: Negative for ear discharge, ear pain and nosebleeds.   Eyes: Negative for double vision and photophobia.  Respiratory: Positive for cough and shortness of breath.   Cardiovascular: Negative for chest pain, orthopnea and leg swelling.  Gastrointestinal: Negative for nausea and vomiting.  Genitourinary: Negative for frequency and urgency.  Musculoskeletal: Positive for myalgias. Negative for falls.  Neurological: Negative for tingling, tremors, seizures and loss of consciousness.  Psychiatric/Behavioral: Negative for hallucinations and memory loss. The patient is not nervous/anxious.     Past Medical History:  Diagnosis Date  . Anxiety   . Cellulitis 08/04/2017  . Cerebral palsy ([place])   . Chronic diastolic (congestive) heart failure ([place])   . CP (cerebral palsy) ([place])   . Diabetes mellitus without complication (HCC)    borderline  . Environmental allergies    takes inhalers if needed  . Esophageal stricture   . GERD (gastroesophageal reflux disease)   . Hypertension   . Motility disorder, esophageal   . Pneumonia   . 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 S/P Botox injection    approx every 4 months  . Seasonal allergies     Past Surgical History:  Procedure Laterality Date  . BOTOX INJECTION N/A 12/21/2012   Procedure: BOTOX INJECTION;  Surgeon: Inda Castle, MD;  Location: [facility];  Service: Endoscopy;  Laterality: N/A;  . BOTOX INJECTION N/A 06/28/2014   Procedure: BOTOX INJECTION;  Surgeon: Inda Castle, MD;  Location: [place];  Service: Endoscopy;  Laterality: N/A;  . ESOPHAGOGASTRODUODENOSCOPY N/A 12/21/2012   Procedure: ESOPHAGOGASTRODUODENOSCOPY (EGD);  Surgeon: Inda Castle, MD;  Location: [facility];  Service: Endoscopy;  Laterality: N/A;  . ESOPHAGOGASTRODUODENOSCOPY N/A 06/28/2014   Procedure: ESOPHAGOGASTRODUODENOSCOPY (EGD);  Surgeon: Inda Castle, MD;  Location: [place];  Service: Endoscopy;  Laterality: N/A;  . ESOPHAGOGASTRODUODENOSCOPY (EGD) WITH PROPOFOL N/A 09/03/2017   Procedure: ESOPHAGOGASTRODUODENOSCOPY (EGD) WITH PROPOFOL;  Surgeon: Doran Stabler, MD;  Location: [facility];  Service: Gastroenterology;  Laterality: N/A;  . ESOPHAGUS SURGERY     stretched esophagus  . legs    . MOUTH SURGERY    . TENDON RELEASE       reports that he has never smoked. He has quit using smokeless tobacco. He reports current alcohol use. He reports that he does not use drugs.  Allergies  Allergen Reactions  .  Penicillins Hives, Nausea And Vomiting and Other (See Comments)    Patient tolerated cefazolin in 2017 Has patient had a PCN reaction causing immediate rash, facial/tongue/throat swelling, SOB or lightheadedness with hypotension: Yes Has patient had a PCN reaction causing severe rash involving mucus membranes or skin necrosis: No Has patient had a PCN  reaction that required hospitalization No Has patient had a PCN reaction occurring within the last 10 years: Yes If all of the above answers are "NO", then may proceed with Cephalosporin use.  . Sulfamethoxazole-Trimethoprim Nausea And Vomiting  . Metronidazole Nausea And Vomiting    Family History  Problem Relation Age of Onset  . Hypertension Father   . Lung cancer Father   . Diabetes Mother     Prior to Admission medications   Medication Sig Start Date End Date Taking? Authorizing Provider  amLODipine (NORVASC) 10 MG tablet Take 10 mg by mouth daily.    [provider]  aspirin 81 MG chewable tablet Chew by mouth daily.    [provider]  baclofen (LIORESAL) 10 MG tablet Take 1/2 tablet (5 mg dose) by MOUTH AT bedtime. 06/01/18   [provider]  budesonide-formoterol (SYMBICORT) 80-4.5 MCG/ACT inhaler Inhale 2 puffs into the lungs 2 (two) times daily.    [provider]  [employer] CLOTRIMAZOLE 1 % cream Apply 1 application topically 2 (two) times daily as needed (rash). BETWEEN TOES 08/19/17   [provider]  empagliflozin (JARDIANCE) 10 MG TABS tablet Take 10 mg by mouth daily.    [provider]  furosemide (LASIX) 20 MG tablet Take 20 mg by mouth daily. 07/15/17   [provider]  latanoprost (XALATAN) 0.005 % ophthalmic solution Place 1 drop into both eyes daily as needed (dry eyes).     [provider]  metFORMIN (GLUCOPHAGE) 500 MG tablet Take 1,000 mg by mouth at bedtime.     [provider]  metFORMIN (GLUCOPHAGE-XR) 500 MG 24 hr tablet  02/14/19   [provider]  montelukast (SINGULAIR) 10 MG tablet Take 10 mg by mouth daily.  08/12/16   [provider]  mupirocin ointment (BACTROBAN) 2 % Apply to affected as needed for rash 04/30/14   [provider]  naproxen sodium (ALEVE) 220 MG tablet Take 220-440 mg by mouth 2 (two) times daily as needed (for pain).    [provider]  nitroGLYCERIN (NITROSTAT) 0.4 MG SL tablet Place 0.4 mg under the tongue every 5 (five) minutes as needed for chest pain.     [provider]  omeprazole (PRILOSEC) 20 MG capsule Take 20 mg by mouth daily.    [provider]  ondansetron (ZOFRAN) 4 MG tablet Take 1 tablet (4 mg total) by mouth every 6 (six) hours. Patient taking differently: Take 4 mg by mouth every 6 (six) hours as needed for nausea or vomiting.  02/05/17   Orpah Greek, MD  polyethylene glycol powder (GLYCOLAX/MIRALAX) 17 GM/SCOOP powder Take by mouth.    [provider]  potassium chloride (K-DUR) 10 MEQ tablet Take 10 mEq by mouth daily.    [provider]  rosuvastatin (CRESTOR) 5 MG tablet Take 5 mg by mouth at bedtime.    [provider]  valsartan (DIOVAN) 80 MG tablet Take 80 mg by mouth daily.    [provider]    Physical Exam: Vitals:   03/21/19 1330 03/21/19 1345 03/21/19 1400 03/21/19 1500  BP: 123/71  124/74 124/74  Pulse: (!) 120 (!)  140 82 82  Resp: (!) 48 (!) 41 (!) 50 (!) 24  Temp:      TempSrc:      SpO2: 98% 98% 99% 99%  Weight:        Constitutional: NAD, calm, comfortable Vitals:   03/21/19 1330 03/21/19 1345 03/21/19 1400 03/21/19 1500  BP: 123/71  124/74 124/74  Pulse: (!) 120 (!) 140 82 82  Resp: (!) 48 (!) 41 (!) 50 (!) 24  Temp:      TempSrc:      SpO2: 98% 98% 99% 99%  Weight:       Eyes: PERRL, lids and conjunctivae normal ENMT: Mucous membranes are moist, pupil reactive Neck: normal, supple, no masses, no thyromegaly Respiratory: increased resp effort on NRB, no audible wheezing Cardiovascular: tachycardic, s1, s2 Abdomen: no tenderness, nontender Musculoskeletal: chronic UE and LE contractures, perfused Skin: no rashes, lesions. No induration Neurologic: CN 2-12 grossly intact. Sensation intact no tremors Psychiatric: Normal judgment and insight. Alert and oriented x 3. Normal mood.    Labs on Admission:  I have personally reviewed following labs and imaging studies  CBC: Recent Labs  Lab 03/21/19 1330  WBC 6.0  NEUTROABS 4.7  HGB 13.0  HCT 40.6  MCV 80.4  PLT 0000000   Basic Metabolic Panel: Recent Labs  Lab 03/21/19 1330  NA 137  K 4.0  CL 103  CO2 23  GLUCOSE 123*  BUN 16  CREATININE 0.80  CALCIUM 8.8*   GFR: CrCl cannot be calculated (Unknown ideal weight.). Liver Function Tests: Recent Labs  Lab 03/21/19 1330  AST 36  ALT 33  ALKPHOS 120  BILITOT 0.7  PROT 7.2  ALBUMIN 4.4   No results for input(s): LIPASE, AMYLASE in the last 168 hours. No results for input(s): AMMONIA in the last 168 hours. Coagulation Profile: Recent Labs  Lab 03/21/19 1330  INR 1.1   Cardiac Enzymes: No results for input(s): CKTOTAL, CKMB, CKMBINDEX, TROPONINI in the last 168 hours. BNP (last 3 results) No results for input(s): PROBNP in the last 8760 hours. HbA1C: No results for input(s): HGBA1C in the last 72 hours. CBG: No results for input(s): GLUCAP in the last 168 hours. Lipid Profile: No results for input(s): CHOL, HDL, LDLCALC, TRIG, CHOLHDL, LDLDIRECT in the last 72 hours. Thyroid Function Tests: No results for input(s): TSH, T4TOTAL, FREET4, T3FREE, THYROIDAB in the last 72 hours. Anemia Panel: No results for input(s): VITAMINB12, FOLATE, FERRITIN, TIBC, IRON, RETICCTPCT in the last 72 hours. Urine analysis:    Component Value Date/Time   COLORURINE STRAW (A) 05/07/2017 1403   APPEARANCEUR CLEAR 05/07/2017 1403   LABSPEC 1.006 05/07/2017 1403   PHURINE 6.0 05/07/2017 1403   GLUCOSEU NEGATIVE 05/07/2017 1403   HGBUR NEGATIVE 05/07/2017 1403   BILIRUBINUR NEGATIVE 05/07/2017 1403   KETONESUR NEGATIVE 05/07/2017 1403   PROTEINUR NEGATIVE 05/07/2017 1403   UROBILINOGEN 1.0 05/26/2014 2211   NITRITE NEGATIVE 05/07/2017 1403   LEUKOCYTESUR NEGATIVE 05/07/2017 1403   Sepsis Labs:  !!!!!!!!!!!!!!!!!!!!!!!!!!!!!!!!!!!!!!!!!!!! @LABRCNTIP (procalcitonin:4,lacticidven:4) )No results found for this or any previous visit (from the past 240 hour(s)).   Radiological Exams on Admission: DG Chest Port 1 View  Result Date: 03/21/2019 CLINICAL DATA:  Shortness of breath, cough and fever. EXAM: PORTABLE CHEST 1 VIEW COMPARISON:  December 24, 2017 FINDINGS: Persistent volume loss is seen in the right with mild, stable elevation of the right hemidiaphragm. Mild, stable chronic appearing increased lung markings are noted without evidence of acute infiltrate, pleural effusion or pneumothorax. The  heart size and mediastinal contours are within normal limits. There is moderate severity levoscoliosis of the thoracic spine. IMPRESSION: 1. Stable exam without evidence of acute or active cardiopulmonary disease. Electronically Signed   By: Virgina Norfolk M.D.   On: 03/21/2019 15:18    EKG: Independently reviewed. Sinus tach  Assessment/Plan Principal Problem:   Pneumonia due to COVID-19 virus Active Problems:   Chronic diastolic (congestive) heart failure (HCC)   Essential hypertension   Tachycardia   Sepsis ([place])   Acute respiratory failure with hypoxia (HCC)   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HCC)   Type 2 diabetes mellitus with complication, without long-term current use of insulin (HCC)   Hyperlipidemia   1. Acute hypoxemic resp failure secondary to covid PNA 1. Presents with acute hypoxia with O2 requirements at [facility] currently 2. Covid ag is pos 3. CXR reviewed, clear 4. Inflammatory markers mildly elevated with CRP just over 3 5. Will start decadron x 10 days and remdesivir per pharmacy dosing 6. Admit inpt to [facility] 2. Chronic diastolic chf 1. No significant LE edema at this time 2. Given COVID pna, would avoid over-hydration 3. HTN 1. BP stable at this time 2. Will cont home meds as tolerated 4. Tachycardia 1. Secondary to COVID PNA  5. Hx quadriplegic 1. Seems stable at  this time 6. Type 2 DM without insulin use 1. Hold oral hyperglycemic meds 2. Random glucose currently stable 3. Will continue on SSI coverage 7. HLD 1. Continue statin per home regimen  DVT prophylaxis: Lovenox subQ  Code Status: Full Family Communication: Pt in room, family not at bedside  Disposition Plan: Uncertain at this time  Consults called:  Admission status: Inpatient as would likely require greater than 2 midnight stay to treat COVID PNA   Marylu Lund MD [facility] Pager On Amion  If 7PM-7AM, please contact night-coverage  03/21/2019, 4:40 PM

## 2019-03-22 DIAGNOSIS — J9601 Acute respiratory failure with hypoxia: Secondary | ICD-10-CM

## 2019-03-22 DIAGNOSIS — R000 Tachycardia, unspecified: Secondary | ICD-10-CM

## 2019-03-22 DIAGNOSIS — E118 Type 2 diabetes mellitus with unspecified complications: Secondary | ICD-10-CM

## 2019-03-22 DIAGNOSIS — A4189 Other specified sepsis: Principal | ICD-10-CM

## 2019-03-22 LAB — CBC WITH DIFFERENTIAL/PLATELET
Abs Immature Granulocytes: 0.01 10*3/uL (ref 0.00–0.07)
Basophils Absolute: 0 10*3/uL (ref 0.0–0.1)
Basophils Relative: 0 %
Eosinophils Absolute: 0 10*3/uL (ref 0.0–0.5)
Eosinophils Relative: 0 %
HCT: 39.3 % (ref 39.0–52.0)
Hemoglobin: 12.3 g/dL — ABNORMAL LOW (ref 13.0–17.0)
Immature Granulocytes: 0 %
Lymphocytes Relative: 22 %
Lymphs Abs: 0.5 10*3/uL — ABNORMAL LOW (ref 0.7–4.0)
MCH: 25.1 pg — ABNORMAL LOW (ref 26.0–34.0)
MCHC: 31.3 g/dL (ref 30.0–36.0)
MCV: 80 fL (ref 80.0–100.0)
Monocytes Absolute: 0.2 10*3/uL (ref 0.1–1.0)
Monocytes Relative: 7 %
Neutro Abs: 1.6 10*3/uL — ABNORMAL LOW (ref 1.7–7.7)
Neutrophils Relative %: 71 %
Platelets: 154 10*3/uL (ref 150–400)
RBC: 4.91 MIL/uL (ref 4.22–5.81)
RDW: 13.5 % (ref 11.5–15.5)
WBC: 2.3 10*3/uL — ABNORMAL LOW (ref 4.0–10.5)
nRBC: 0 % (ref 0.0–0.2)

## 2019-03-22 LAB — COMPREHENSIVE METABOLIC PANEL
ALT: 34 U/L (ref 0–44)
AST: 27 U/L (ref 15–41)
Albumin: 3.9 g/dL (ref 3.5–5.0)
Alkaline Phosphatase: 104 U/L (ref 38–126)
Anion gap: 11 (ref 5–15)
BUN: 21 mg/dL — ABNORMAL HIGH (ref 6–20)
CO2: 20 mmol/L — ABNORMAL LOW (ref 22–32)
Calcium: 9.2 mg/dL (ref 8.9–10.3)
Chloride: 109 mmol/L (ref 98–111)
Creatinine, Ser: 0.75 mg/dL (ref 0.61–1.24)
GFR calc Af Amer: >60 mL/min (ref >60)
GFR calc non Af Amer: >60 mL/min (ref >60)
Glucose, Bld: 216 mg/dL — ABNORMAL HIGH (ref 70–99)
Potassium: 4.1 mmol/L (ref 3.5–5.1)
Sodium: 140 mmol/L (ref 135–145)
Total Bilirubin: 0.3 mg/dL (ref 0.3–1.2)
Total Protein: 6.9 g/dL (ref 6.5–8.1)

## 2019-03-22 LAB — GLUCOSE, CAPILLARY
Glucose-Capillary: 145 mg/dL — ABNORMAL HIGH (ref 70–99)
Glucose-Capillary: 169 mg/dL — ABNORMAL HIGH (ref 70–99)
Glucose-Capillary: 190 mg/dL — ABNORMAL HIGH (ref 70–99)
Glucose-Capillary: 198 mg/dL — ABNORMAL HIGH (ref 70–99)

## 2019-03-22 LAB — HIV ANTIBODY (ROUTINE TESTING W REFLEX): HIV Screen 4th Generation wRfx: NONREACTIVE

## 2019-03-22 LAB — C-REACTIVE PROTEIN: CRP: 6.8 mg/dL — ABNORMAL HIGH (ref <1.0)

## 2019-03-22 NOTE — Progress Notes (Signed)
Patient was speaking on his cell phone to his [place], [place].  The patient asked me to please speak to his home director for a few minutes.  Group Home Director was updated on latest vital signs and patient condition.  RN inquired about patient's transportation availability, should he be discharged, but still be in need of outpatient IV clinic-- IV Remdesivir treatment.  Crystal communicated that unfortunately, their Lucianne Lei driver is out on a [id number] Quarantine after been exposed to the patient, prior his hospitalization, therefore his Group Home and Caregivers would not be able to ensure transportation to [facility], this week.      03/22/19 1300  Family/Significant Other Communication  Family/Significant Other Update Called;Updated;Other (Comment) Veterinary surgeon, Mudlogger of patient's Group/[facility])

## 2019-03-22 NOTE — Progress Notes (Signed)
Left voicemail with [name] patient's caregiver to see if she could drop off patient's cell phone charger.

## 2019-03-22 NOTE — Plan of Care (Signed)
  Problem: Education: Goal: Knowledge of risk factors and measures for prevention of condition will improve Outcome: Progressing   Problem: Coping: Goal: Psychosocial and spiritual needs will be supported Outcome: Progressing   Problem: Respiratory: Goal: Will maintain a patent airway Outcome: Progressing Goal: Complications related to the disease process, condition or treatment will be avoided or minimized Outcome: Progressing   Problem: Health Behavior/Discharge Planning: Goal: Ability to manage health-related needs will improve Outcome: Progressing   Problem: Activity: Goal: Risk for activity intolerance will decrease Outcome: Progressing   Problem: Nutrition: Goal: Adequate nutrition will be maintained Outcome: Progressing   Problem: Coping: Goal: Level of anxiety will decrease Outcome: Progressing   Problem: Elimination: Goal: Will not experience complications related to bowel motility Outcome: Progressing Goal: Will not experience complications related to urinary retention Outcome: Progressing   Problem: Safety: Goal: Ability to remain free from injury will improve Outcome: Progressing   Problem: Skin Integrity: Goal: Risk for impaired skin integrity will decrease Outcome: Progressing

## 2019-03-22 NOTE — Progress Notes (Signed)
Ramos Ramos Ramos DOA: 03/21/2019 PCP: Chesley Noon, Ramos   Brief Narrative: Ramos Ramos is a 49 y.o. male with medical history significant of Ramos Ramos Ramos Ramos Ramos Ramos Ramos Ramos Ramos Ramos Ramos requiring NRB despite nonacute chest xray findings. He was febrile and tachycardic as well as tachypneic. He was admitted 1/5 to [facility], started on remdesivir and steroids.  Assessment & Plan: Principal Problem:   Pneumonia due to COVID-19 virus Active Problems:   Chronic diastolic (congestive) heart failure (HCC)   Essential hypertension   Tachycardia   Sepsis ([place])   Acute respiratory failure with Ramos (HCC)   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HCC)   Type 2 diabetes mellitus with complication, without long-term current use of insulin ([place])   Hyperlipidemia   Sepsis due to COVID-19 ([place])  Sepsis due to covid-19 pneumonia: POA, resolved.  Acute hypoxic respiratory failure due to covid-19 pneumonia:  - CXR reassuring and weaning down oxygen successfully though remains hypoxic. Inflammatory marker however has trended initially upward. Will need to continue monitoring. Continue remdesivir 1/5 - 1/9 and decadron.  - Prone, IS, FV - Vit C, zinc, avoid NSAIDs, maintain euvolemia.   Ramos:  - Continue SSI  HLD:  - Continue statin  Chronic HFpEF: Euvolemic.  - Continue home medications  Ramos Ramos:  - Continue home medications.   History of Ramos Ramos, Ramos: No acute interventions necessary.   RN Pressure Injury Documentation: Pressure Injury 03/21/19 Sacrum Medial Stage 1 -  Intact skin with non-blanchable redness of a localized area usually over a bony prominence. (Active)  03/21/19 2326  Location: Sacrum  Location Orientation: Medial  Staging: Stage 1 -  Intact skin with non-blanchable redness of a localized area usually over a bony prominence.  Wound  Description (Comments):   Present on Admission: Yes   Obesity: BMI 31. Noted  DVT prophylaxis: Lovenox Code Status: Full Family Communication: Group Home Director was updated today. Disposition Plan: Group Home once stable and completed remdesivir therapy. Transportation to outpatient infusion clinic for completion of remdesivir Tx is not possible (see RN notes)  Consultants:   None  Procedures:   None  Antimicrobials:  Remdesivir 1/5 - 1/9   Subjective: Feels better, shortness of breath is less, still coughing constantly, severely, and stable from admission, slightly improved with antitussives, associated with worsening shortness of breath.   Objective: Vitals:   03/22/19 0356 03/22/19 0926 03/22/19 1105 03/22/19 1535  BP: 131/78  116/77 104/63  Pulse:  (!) 106 (!) 104 95  Resp: 18  16 20   Temp: 98 F (36.7 C)  97.7 F (36.5 C) (!) 97.4 F (36.3 C)  TempSrc:   Oral Oral  SpO2: 94% 99% 98% 99%  Weight:      Height:        Intake/Output Summary (Last 24 hours) at 03/22/2019 1806 Last data filed at 03/22/2019 1530 Gross per 24 hour  Intake 1370 ml  Output 1200 ml  Net 170 ml   Filed Weights   03/21/19 1310 03/21/19 2350  Weight: 86.2 kg 86.2 kg    Gen: 49 y.o. male in no distress  Pulm: Non-labored breathing, slightly tachypneic. Clear to auscultation bilaterally.  CV: Regular rate and rhythm. No murmur, rub, or gallop. No JVD, no pedal edema. GI: Abdomen soft, non-tender, non-distended, with normoactive bowel sounds. No organomegaly or masses felt. Ext: Warm.Decreased muscle bulk, increased tone. Skin: No rashes,  lesions or ulcers on visualized skin including legs Neuro: Alert and oriented. Spastic incomplete Ramos noted.  Psych: Judgement and insight appear normal. Mood & affect appropriate.   Data Reviewed: I have personally reviewed following labs and imaging studies  CBC: Recent Labs  Lab 03/21/19 1330 03/21/19 1958 03/22/19 1026  WBC 6.0  5.5 2.3*  NEUTROABS 4.7  --  1.6*  HGB 13.0 12.0* 12.3*  HCT 40.6 39.3 39.3  MCV 80.4 82.7 80.0  PLT 179 161 [id number]   Basic Metabolic Panel: Recent Labs  Lab 03/21/19 1330 03/21/19 1958 03/22/19 1026  NA 137  --  140  K 4.0  --  4.1  CL 103  --  109  CO2 23  --  20*  GLUCOSE 123*  --  216*  BUN 16  --  21*  CREATININE 0.80 0.85 0.75  CALCIUM 8.8*  --  9.2   GFR: Estimated Creatinine Clearance: 116.5 mL/min (by C-G formula based on SCr of 0.75 mg/dL). Liver Function Tests: Recent Labs  Lab 03/21/19 1330 03/22/19 1026  AST 36 27  ALT 33 34  ALKPHOS 120 104  BILITOT 0.7 0.3  PROT 7.2 6.9  ALBUMIN 4.4 3.9   No results for input(s): LIPASE, AMYLASE in the last 168 hours. No results for input(s): AMMONIA in the last 168 hours. Coagulation Profile: Recent Labs  Lab 03/21/19 1330  INR 1.1   Cardiac Enzymes: No results for input(s): CKTOTAL, CKMB, CKMBINDEX, TROPONINI in the last 168 hours. BNP (last 3 results) No results for input(s): PROBNP in the last 8760 hours. HbA1C: Recent Labs    03/21/19 1958  HGBA1C 7.4*   CBG: Recent Labs  Lab 03/21/19 2308 03/22/19 0722 03/22/19 1121 03/22/19 1634  GLUCAP 249* 145* 169* 190*   Lipid Profile: Recent Labs    03/21/19 1326  TRIG 137   Thyroid Function Tests: No results for input(s): TSH, T4TOTAL, FREET4, T3FREE, THYROIDAB in the last 72 hours. Anemia Panel: Recent Labs    03/21/19 1615  FERRITIN 13*   Urine analysis:    Component Value Date/Time   COLORURINE YELLOW 03/21/2019 2013   APPEARANCEUR CLEAR 03/21/2019 2013   LABSPEC 1.028 03/21/2019 2013   PHURINE 5.0 03/21/2019 2013   GLUCOSEU >=500 (A) 03/21/2019 2013   HGBUR NEGATIVE 03/21/2019 2013   BILIRUBINUR NEGATIVE 03/21/2019 2013   KETONESUR 5 (A) 03/21/2019 2013   PROTEINUR NEGATIVE 03/21/2019 2013   UROBILINOGEN 1.0 05/26/2014 2211   NITRITE NEGATIVE 03/21/2019 2013   LEUKOCYTESUR NEGATIVE 03/21/2019 2013   Recent Results (from the past  240 hour(s))  Culture, blood (Routine x 2)     Status: None (Preliminary result)   Collection Time: 03/21/19  1:30 PM   Specimen: Left Antecubital; Blood  Result Value Ref Range Status   Specimen Description   Final    LEFT ANTECUBITAL Performed at [facility], [place] [address] [place], [place] [address]    Special Requests   Final    BOTTLES DRAWN AEROBIC AND ANAEROBIC Blood Culture adequate volume Performed at [facility] [address], [place], [place] [address]    Culture   Final    NO GROWTH < 24 HOURS Performed at [facility] [address] [place], [place] [address]    Report Status PENDING  Incomplete  Culture, blood (Routine x 2)     Status: None (Preliminary result)   Collection Time: 03/21/19  1:30 PM   Specimen: BLOOD LEFT HAND  Result  Value Ref Range Status   Specimen Description   Final    BLOOD LEFT HAND Performed at [facility] [address], [place], [place] [address]    Special Requests   Final    BOTTLES DRAWN AEROBIC AND ANAEROBIC Blood Culture adequate volume Performed at [facility] [address] [place], [place] [address]    Culture   Final    NO GROWTH < 24 HOURS Performed at [facility] [address] [place], [place] [address]    Report Status PENDING  Incomplete      Radiology Studies: DG Chest Port 1 View  Result Date: 03/21/2019 CLINICAL DATA:  Shortness of breath, Ramos and fever. EXAM: PORTABLE CHEST 1 VIEW COMPARISON:  December 24, 2017 FINDINGS: Persistent volume loss is seen in the right with mild, stable elevation of the right hemidiaphragm. Mild, stable chronic appearing increased lung markings are noted without evidence of acute infiltrate, pleural effusion or pneumothorax. The heart size and mediastinal contours are within normal limits. There is moderate severity levoscoliosis of the thoracic spine. IMPRESSION: 1. Stable exam without  evidence of acute or active cardiopulmonary disease. Electronically Signed   By: Virgina Norfolk M.D.   On: 03/21/2019 15:18    Scheduled Meds: . amLODipine  10 mg Oral Daily  . vitamin C  500 mg Oral Daily  . baclofen  5 mg Oral QHS  . dexamethasone (DECADRON) injection  6 mg Intravenous Q24H  . enoxaparin (LOVENOX) injection  40 mg Subcutaneous Q24H  . insulin aspart  0-15 Units Subcutaneous TID WC  . insulin aspart  0-5 Units Subcutaneous QHS  . irbesartan  37.5 mg Oral Daily  . montelukast  10 mg Oral Daily  . pantoprazole  40 mg Oral Daily  . rosuvastatin  5 mg Oral QHS  . zinc sulfate  220 mg Oral Daily   Continuous Infusions: . remdesivir 100 mg in NS 100 mL Stopped (03/22/19 1000)     LOS: 1 day   Time spent: 35 minutes.  Patrecia Pour, Ramos [facility] [email or web address] 03/22/2019, 6:06 PM

## 2019-03-23 DIAGNOSIS — G8250 Quadriplegia, unspecified: Secondary | ICD-10-CM

## 2019-03-23 DIAGNOSIS — E785 Hyperlipidemia, unspecified: Secondary | ICD-10-CM

## 2019-03-23 LAB — CBC WITH DIFFERENTIAL/PLATELET
Abs Immature Granulocytes: 0.02 10*3/uL (ref 0.00–0.07)
Basophils Absolute: 0 10*3/uL (ref 0.0–0.1)
Basophils Relative: 0 %
Eosinophils Absolute: 0 10*3/uL (ref 0.0–0.5)
Eosinophils Relative: 0 %
HCT: 36.8 % — ABNORMAL LOW (ref 39.0–52.0)
Hemoglobin: 11.6 g/dL — ABNORMAL LOW (ref 13.0–17.0)
Immature Granulocytes: 1 %
Lymphocytes Relative: 21 %
Lymphs Abs: 0.6 10*3/uL — ABNORMAL LOW (ref 0.7–4.0)
MCH: 25.6 pg — ABNORMAL LOW (ref 26.0–34.0)
MCHC: 31.5 g/dL (ref 30.0–36.0)
MCV: 81.1 fL (ref 80.0–100.0)
Monocytes Absolute: 0.1 10*3/uL (ref 0.1–1.0)
Monocytes Relative: 4 %
Neutro Abs: 2.2 10*3/uL (ref 1.7–7.7)
Neutrophils Relative %: 74 %
Platelets: 142 10*3/uL — ABNORMAL LOW (ref 150–400)
RBC: 4.54 MIL/uL (ref 4.22–5.81)
RDW: 13.8 % (ref 11.5–15.5)
WBC: 2.9 10*3/uL — ABNORMAL LOW (ref 4.0–10.5)
nRBC: 0 % (ref 0.0–0.2)

## 2019-03-23 LAB — COMPREHENSIVE METABOLIC PANEL
ALT: 27 U/L (ref 0–44)
AST: 26 U/L (ref 15–41)
Albumin: 3.6 g/dL (ref 3.5–5.0)
Alkaline Phosphatase: 84 U/L (ref 38–126)
Anion gap: 14 (ref 5–15)
BUN: 23 mg/dL — ABNORMAL HIGH (ref 6–20)
CO2: 17 mmol/L — ABNORMAL LOW (ref 22–32)
Calcium: 8.5 mg/dL — ABNORMAL LOW (ref 8.9–10.3)
Chloride: 103 mmol/L (ref 98–111)
Creatinine, Ser: 0.64 mg/dL (ref 0.61–1.24)
GFR calc Af Amer: >60 mL/min (ref >60)
GFR calc non Af Amer: >60 mL/min (ref >60)
Glucose, Bld: 157 mg/dL — ABNORMAL HIGH (ref 70–99)
Potassium: 5 mmol/L (ref 3.5–5.1)
Sodium: 134 mmol/L — ABNORMAL LOW (ref 135–145)
Total Bilirubin: 0.8 mg/dL (ref 0.3–1.2)
Total Protein: 6.5 g/dL (ref 6.5–8.1)

## 2019-03-23 LAB — GLUCOSE, CAPILLARY
Glucose-Capillary: 148 mg/dL — ABNORMAL HIGH (ref 70–99)
Glucose-Capillary: 161 mg/dL — ABNORMAL HIGH (ref 70–99)
Glucose-Capillary: 220 mg/dL — ABNORMAL HIGH (ref 70–99)
Glucose-Capillary: 170 mg/dL — ABNORMAL HIGH (ref 70–99)

## 2019-03-23 LAB — C-REACTIVE PROTEIN: CRP: 4.3 mg/dL — ABNORMAL HIGH (ref <1.0)

## 2019-03-23 MED ORDER — SENNOSIDES-DOCUSATE SODIUM 8.6-50 MG PO TABS: 1.0000 | ORAL_TABLET | Freq: Every evening | ORAL | Status: DC | PRN

## 2019-03-23 MED ORDER — FERROUS SULFATE 325 (65 FE) MG PO TABS
325.0000 mg | ORAL_TABLET | Freq: Every day | ORAL | Status: DC
  Administered 2019-03-23 – 2019-03-25 (×3): 325 mg via ORAL
  Filled 2019-03-23 (×4): qty 1

## 2019-03-23 NOTE — Progress Notes (Signed)
PROGRESS NOTE  Thomas Ramos  [id number] DOB: [birth year] DOA: 03/21/2019 PCP: Chesley Noon, MD   Brief Narrative: Thomas Ramos is a 49 y.o. male with medical history significant of quadriplegia, CP, HTN, CHF, NIDT2DM who presented from group home with cough, dyspnea and found to be covid-19 antigen-positive with hypoxia requiring NRB despite nonacute chest xray findings. He was febrile and tachycardic as well as tachypneic. He was admitted 1/5 to [facility], [place], started on remdesivir and steroids.  Assessment & Plan: Principal Problem:   Pneumonia due to COVID-19 virus Active Problems:   Chronic diastolic (congestive) heart failure (HCC)   Essential hypertension   Tachycardia   Sepsis ([place])   Pressure injury of skin   Acute respiratory failure with hypoxia (HCC)   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HCC)   Type 2 diabetes mellitus with complication, without long-term current use of insulin ([place])   Hyperlipidemia   Sepsis due to COVID-19 ([place])  Sepsis due to covid-19 pneumonia: POA, resolved.  Acute hypoxic respiratory failure due to covid-19 pneumonia:  - CXR reassuring and weaning down oxygen successfully though remains hypoxic. Inflammatory marker however has trended initially upward. Will need to continue monitoring. Continue remdesivir 1/5 - 1/9 and decadron.  - Prone, IS, FV - Vit C, zinc, avoid NSAIDs, maintain euvolemia.  - CRP now showing some improvement, remains on room air. Pt unable to be transported to outpatient infusion clinic for completion of remdesivir, so will remain inpatient for now.  - Antitussives - Standard doe lovenox DVT ppx. - Vit C, zinc, FV, IS.  Asthma: No wheezing.  - Continue singulair, BDs.   NIDT2DM: HbA1c 7.4% - Continue SSI  HLD:  - Continue statin  Chronic HFpEF: Euvolemic.  - Continue home medications  HTN:  - Continue home medications.   History of CP, quadriplegia: No acute interventions necessary.  - PT/OT consulted  Iron deficiency  anemia: Mild. Ferritin low at 13 even in setting of acute infection.  - Will initiate supplement with concomitant prn bowel regimen.   RN Pressure Injury Documentation: Pressure Injury 03/21/19 Sacrum Medial Stage 1 -  Intact skin with non-blanchable redness of a localized area usually over a bony prominence. (Active)  03/21/19 2326  Location: Sacrum  Location Orientation: Medial  Staging: Stage 1 -  Intact skin with non-blanchable redness of a localized area usually over a bony prominence.  Wound Description (Comments):   Present on Admission: Yes   Obesity: BMI 31. Noted  GERD:  - Continue PPI  DVT prophylaxis: Lovenox Code Status: Full Family Communication: Group Home Director updated daily. Disposition Plan: Group Home once stable and completed remdesivir therapy. Transportation to outpatient infusion clinic for completion of remdesivir Tx is not possible (see RN notes)  Consultants:   None  Procedures:   None  Antimicrobials:  Remdesivir 1/5 - 1/9   Subjective: Feels less short of breath, still some cough and had burning at IV site last night, none currently. Denies rash.   Objective: Vitals:   03/22/19 1535 03/22/19 1944 03/23/19 0307 03/23/19 0900  BP: 104/63 138/85 122/73 108/64  Pulse: 95 96 76 98  Resp: 20 20 20 20   Temp: (!) 97.4 F (36.3 C) 97.7 F (36.5 C) 98.3 F (36.8 C) 98.2 F (36.8 C)  TempSrc: Oral Oral Axillary Oral  SpO2: 99% 97% 96% 96%  Weight:      Height:        Intake/Output Summary (Last 24 hours) at 03/23/2019 1019 Last data filed at 03/23/2019 0900  Gross per 24 hour  Intake 900 ml  Output 500 ml  Net 400 ml   Filed Weights   03/21/19 1310 03/21/19 2350  Weight: 86.2 kg 86.2 kg   Gen: 49 y.o. male in no distress Pulm: Nonlabored breathing room air. Clear. CV: Regular rate and rhythm. No murmur, rub, or gallop. No JVD, no dependent edema. GI: Abdomen soft, non-tender, non-distended, with normoactive bowel sounds.  Ext: Warm,  stable spastic deformities x4. Left AC IV site with dried blood, no surrounding erythema/fluctuance.  Skin: No new rashes, lesions or ulcers on visualized skin. Neuro: Alert and oriented. No new focal neurological deficits. Psych: Judgement and insight appear fair. Mood euthymic & affect congruent. Behavior is appropriate.    Data Reviewed: I have personally reviewed following labs and imaging studies  CBC: Recent Labs  Lab 03/21/19 1330 03/21/19 1958 03/22/19 1026 03/23/19 0330  WBC 6.0 5.5 2.3* 2.9*  NEUTROABS 4.7  --  1.6* 2.2  HGB 13.0 12.0* 12.3* 11.6*  HCT 40.6 39.3 39.3 36.8*  MCV 80.4 82.7 80.0 81.1  PLT 179 161 154 [id number]*   Basic Metabolic Panel: Recent Labs  Lab 03/21/19 1330 03/21/19 1958 03/22/19 1026 03/23/19 0330  NA 137  --  140 134*  K 4.0  --  4.1 5.0  CL 103  --  109 103  CO2 23  --  20* 17*  GLUCOSE 123*  --  216* 157*  BUN 16  --  21* 23*  CREATININE 0.80 0.85 0.75 0.64  CALCIUM 8.8*  --  9.2 8.5*   GFR: Estimated Creatinine Clearance: 116.5 mL/min (by C-G formula based on SCr of 0.64 mg/dL). Liver Function Tests: Recent Labs  Lab 03/21/19 1330 03/22/19 1026 03/23/19 0330  AST 36 27 26  ALT 33 34 27  ALKPHOS 120 104 84  BILITOT 0.7 0.3 0.8  PROT 7.2 6.9 6.5  ALBUMIN 4.4 3.9 3.6   HbA1C: Recent Labs    03/21/19 1958  HGBA1C 7.4*   CBG: Recent Labs  Lab 03/22/19 0722 03/22/19 1121 03/22/19 1634 03/22/19 2119 03/23/19 0753  GLUCAP 145* 169* 190* 198* 148*   Lipid Profile: Recent Labs    03/21/19 1326  TRIG 137   Anemia Panel: Recent Labs    03/21/19 1615  FERRITIN 13*   Urine analysis:    Component Value Date/Time   COLORURINE YELLOW 03/21/2019 2013   APPEARANCEUR CLEAR 03/21/2019 2013   LABSPEC 1.028 03/21/2019 2013   PHURINE 5.0 03/21/2019 2013   GLUCOSEU >=500 (A) 03/21/2019 2013   HGBUR NEGATIVE 03/21/2019 2013   BILIRUBINUR NEGATIVE 03/21/2019 2013   KETONESUR 5 (A) 03/21/2019 2013   PROTEINUR NEGATIVE  03/21/2019 2013   UROBILINOGEN 1.0 05/26/2014 2211   NITRITE NEGATIVE 03/21/2019 2013   LEUKOCYTESUR NEGATIVE 03/21/2019 2013   Recent Results (from the past 240 hour(s))  Culture, blood (Routine x 2)     Status: None (Preliminary result)   Collection Time: 03/21/19  1:30 PM   Specimen: BLOOD  Result Value Ref Range Status   Specimen Description   Final    BLOOD LEFT ANTECUBITAL Performed at [facility], [place] [address], [place], [place] [address]    Special Requests   Final    BOTTLES DRAWN AEROBIC AND ANAEROBIC Blood Culture adequate volume Performed at [facility] [address], [place], [place] [address]    Culture   Final    NO GROWTH < 24 HOURS Performed at [facility], [address] [place], [place] [address]    Report Status PENDING  Incomplete  Culture, blood (Routine x 2)     Status: None (Preliminary result)   Collection Time: 03/21/19  1:30 PM   Specimen: BLOOD LEFT HAND  Result Value Ref Range Status   Specimen Description   Final    BLOOD LEFT HAND Performed at [facility] [address] [place], [place] [address]    Special Requests   Final    BOTTLES DRAWN AEROBIC AND ANAEROBIC Blood Culture adequate volume Performed at [facility] [address], [place], [place] [address]    Culture   Final    NO GROWTH < 24 HOURS Performed at [facility] [address] [place], [place] [address]    Report Status PENDING  Incomplete      Radiology Studies: DG Chest Port 1 View  Result Date: 03/21/2019 CLINICAL DATA:  Shortness of breath, cough and fever. EXAM: PORTABLE CHEST 1 VIEW COMPARISON:  December 24, 2017 FINDINGS: Persistent volume loss is seen in the right with mild, stable elevation of the right hemidiaphragm. Mild, stable chronic appearing increased lung markings are noted without evidence of acute infiltrate, pleural effusion or pneumothorax. The heart size and mediastinal  contours are within normal limits. There is moderate severity levoscoliosis of the thoracic spine. IMPRESSION: 1. Stable exam without evidence of acute or active cardiopulmonary disease. Electronically Signed   By: Virgina Norfolk M.D.   On: 03/21/2019 15:18    Scheduled Meds: . amLODipine  10 mg Oral Daily  . vitamin C  500 mg Oral Daily  . baclofen  5 mg Oral QHS  . dexamethasone (DECADRON) injection  6 mg Intravenous Q24H  . enoxaparin (LOVENOX) injection  40 mg Subcutaneous Q24H  . insulin aspart  0-15 Units Subcutaneous TID WC  . insulin aspart  0-5 Units Subcutaneous QHS  . irbesartan  37.5 mg Oral Daily  . montelukast  10 mg Oral Daily  . pantoprazole  40 mg Oral Daily  . rosuvastatin  5 mg Oral QHS  . zinc sulfate  220 mg Oral Daily   Continuous Infusions: . remdesivir 100 mg in NS 100 mL 100 mg (03/23/19 0936)     LOS: 2 days   Time spent: 25 minutes.  Patrecia Pour, MD [facility] [email or web address] 03/23/2019, 10:19 AM

## 2019-03-23 NOTE — Evaluation (Signed)
Occupational Therapy Evaluation Patient Details Name: Thomas Ramos MRN: [id number] DOB: 1970-10-04 Today's Date: 03/23/2019    History of Present Illness Thomas Ramos Hulinis a 49 y.o.malewith PMH significant ofquadriplegia, CP, HTN, CHF, NIDT2DM who presented from group home with cough, dyspnea and found to be covid-19 antigen-positive with hypoxia requiring NRB despite nonacute chest xray findings. He was febrile and tachycardic as well as tachypneic. He was admitted 1/5 to [facility], started on remdesivir and steroids   Clinical Impression   At baseline, Pt is assisted for all aspects of ADL with the exception of some grooming and self-feeding. Today he presents supine in bed, total A for bed mobility. He is left hand dominant. Today he was min A for self-feeding but required assistance because the grip on the spoon was too little, and the plastic was too light. 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    Follow Up Recommendations  No OT follow up;Supervision/Assistance - 24 hour    Equipment Recommendations  Other (comment)(adaptive utensils acutely)    Recommendations for Other Services  none     Precautions / Restrictions        Mobility Bed Mobility Overal bed mobility: Needs Assistance Bed Mobility: Rolling Rolling: Total assist            Transfers                 General transfer comment: NT this session    Balance                                           ADL either performed or assessed with clinical judgement   ADL Overall ADL's : At baseline(Pt requires adaptive utensils)                                       General ADL Comments: Pt was able to brush teeth today at bed level, He has been struggling to self-feed  here in the hospital due to lack of access to equipment.      Vision Patient Visual Report: No change from baseline       Perception     Praxis      Pertinent Vitals/Pain Pain Assessment: Faces Faces Pain Scale: Hurts a little bit Pain Location: BLE Pain Descriptors / Indicators: Grimacing Pain Intervention(s): Monitored during session;Repositioned     Hand Dominance Left   Extremity/Trunk Assessment Upper Extremity Assessment Upper Extremity Assessment: (at baseline)   Lower Extremity Assessment Lower Extremity Assessment: (at baseline)       Communication Communication Communication: No difficulties(slurred but understood)   Cognition Arousal/Alertness: Awake/alert Behavior During Therapy: WFL for tasks assessed/performed Overall Cognitive Status: Within Functional Limits for tasks assessed                                     General Comments  Pt on RA throughout session    Exercises Exercises: Other exercises Other Exercises Other Exercises: stretching all 4 extremities 10 seconds x 3   Shoulder Instructions  Home Living Family/patient expects to be discharged to:: Group home                                        Prior Functioning/Environment Level of Independence: Needs assistance  Gait / Transfers Assistance Needed: Pt is a total A for transfers, he is a total A for sitting upright at baseline ADL's / Homemaking Assistance Needed: Pt is max A for ADL with the exception being that Pt self-feeds with adaptive utensils   Comments: 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, he wears adult diapers, he uses his left arm for functional tasks        OT Problem List: Decreased knowledge of use of DME or AE      OT Treatment/Interventions: DME and/or AE instruction;Self-care/ADL training;Therapeutic activities    OT Goals(Current goals can be found in the care plan section) Acute Rehab OT Goals Patient  Stated Goal: to be able to feed myself OT Goal Formulation: With patient Time For Goal Achievement: 04/06/19 Potential to Achieve Goals: Good ADL Goals Pt Will Perform Eating: with modified independence;with adaptive utensils Additional ADL Goal #1: Pt will recall at least 2 energy conservation techniques for ADL completion with his caregiver throughout the day at independent level  OT Frequency: Min 1X/week(one additional time for equipment check)   Barriers to D/C:            Co-evaluation              AM-PAC OT "6 Clicks" Daily Activity     Outcome Measure Help from another person eating meals?: A Lot Help from another person taking care of personal grooming?: A Lot Help from another person toileting, which includes using toliet, bedpan, or urinal?: Total Help from another person bathing (including washing, rinsing, drying)?: A Lot Help from another person to put on and taking off regular upper body clothing?: A Lot Help from another person to put on and taking off regular lower body clothing?: Total 6 Click Score: 10   End of Session Nurse Communication: Mobility status  Activity Tolerance: Patient tolerated treatment well Patient left: in bed;with call bell/phone within reach  OT Visit Diagnosis: Muscle weakness (generalized) (M62.81)                Time: [id number] OT Time Calculation (min): 24 min Charges:  OT General Charges $OT Visit: 1 Visit OT Evaluation $OT Eval Moderate Complexity: 1 Mod OT Treatments $Self Care/Home Management : 8-22 mins  Jesse Sans OTR/L Acute Rehabilitation Services Pager: [phone] Office: [phone]  Mickel Baas  03/23/2019, 3:27 PM

## 2019-03-24 LAB — COMPREHENSIVE METABOLIC PANEL
ALT: 27 U/L (ref 0–44)
AST: 23 U/L (ref 15–41)
Albumin: 3.6 g/dL (ref 3.5–5.0)
Alkaline Phosphatase: 84 U/L (ref 38–126)
Anion gap: 10 (ref 5–15)
BUN: 25 mg/dL — ABNORMAL HIGH (ref 6–20)
CO2: 21 mmol/L — ABNORMAL LOW (ref 22–32)
Calcium: 8.4 mg/dL — ABNORMAL LOW (ref 8.9–10.3)
Chloride: 106 mmol/L (ref 98–111)
Creatinine, Ser: 0.74 mg/dL (ref 0.61–1.24)
GFR calc Af Amer: >60 mL/min (ref >60)
GFR calc non Af Amer: >60 mL/min (ref >60)
Glucose, Bld: 178 mg/dL — ABNORMAL HIGH (ref 70–99)
Potassium: 4.3 mmol/L (ref 3.5–5.1)
Sodium: 137 mmol/L (ref 135–145)
Total Bilirubin: 0.2 mg/dL — ABNORMAL LOW (ref 0.3–1.2)
Total Protein: 6.3 g/dL — ABNORMAL LOW (ref 6.5–8.1)

## 2019-03-24 LAB — CBC WITH DIFFERENTIAL/PLATELET
Abs Immature Granulocytes: 0.03 10*3/uL (ref 0.00–0.07)
Basophils Absolute: 0 10*3/uL (ref 0.0–0.1)
Basophils Relative: 0 %
Eosinophils Absolute: 0 10*3/uL (ref 0.0–0.5)
Eosinophils Relative: 0 %
HCT: 36.8 % — ABNORMAL LOW (ref 39.0–52.0)
Hemoglobin: 11.7 g/dL — ABNORMAL LOW (ref 13.0–17.0)
Immature Granulocytes: 1 %
Lymphocytes Relative: 17 %
Lymphs Abs: 0.6 10*3/uL — ABNORMAL LOW (ref 0.7–4.0)
MCH: 25.7 pg — ABNORMAL LOW (ref 26.0–34.0)
MCHC: 31.8 g/dL (ref 30.0–36.0)
MCV: 80.7 fL (ref 80.0–100.0)
Monocytes Absolute: 0.1 10*3/uL (ref 0.1–1.0)
Monocytes Relative: 4 %
Neutro Abs: 2.9 10*3/uL (ref 1.7–7.7)
Neutrophils Relative %: 78 %
Platelets: 179 10*3/uL (ref 150–400)
RBC: 4.56 MIL/uL (ref 4.22–5.81)
RDW: 13.9 % (ref 11.5–15.5)
WBC: 3.7 10*3/uL — ABNORMAL LOW (ref 4.0–10.5)
nRBC: 0 % (ref 0.0–0.2)

## 2019-03-24 LAB — C-REACTIVE PROTEIN: CRP: 1.8 mg/dL — ABNORMAL HIGH (ref <1.0)

## 2019-03-24 LAB — GLUCOSE, CAPILLARY
Glucose-Capillary: 124 mg/dL — ABNORMAL HIGH (ref 70–99)
Glucose-Capillary: 104 mg/dL — ABNORMAL HIGH (ref 70–99)
Glucose-Capillary: 235 mg/dL — ABNORMAL HIGH (ref 70–99)
Glucose-Capillary: 202 mg/dL — ABNORMAL HIGH (ref 70–99)

## 2019-03-24 NOTE — Plan of Care (Signed)
  Problem: Education: Goal: Knowledge of risk factors and measures for prevention of condition will improve Outcome: Progressing   Problem: Coping: Goal: Psychosocial and spiritual needs will be supported Outcome: Progressing   Problem: Respiratory: Goal: Will maintain a patent airway Outcome: Progressing   Problem: Health Behavior/Discharge Planning: Goal: Ability to manage health-related needs will improve Outcome: Progressing

## 2019-03-24 NOTE — Progress Notes (Signed)
PROGRESS NOTE  Thomas Ramos  [id number] DOB: 15-Jun-1970 DOA: 03/21/2019 PCP: Chesley Noon, MD   Brief Narrative: Thomas Ramos is a 49 y.o. male with medical history significant of quadriplegia, CP, HTN, CHF, NIDT2DM who presented from group home with cough, dyspnea and found to be covid-19 antigen-positive with hypoxia requiring NRB despite nonacute chest xray findings. He was febrile and tachycardic as well as tachypneic. He was admitted 1/5 to [facility], started on remdesivir and steroids.  Assessment & Plan: Principal Problem:   Pneumonia due to COVID-19 virus Active Problems:   Chronic diastolic (congestive) heart failure (HCC)   Essential hypertension   Tachycardia   Sepsis ([place])   Pressure injury of skin   Acute respiratory failure with hypoxia (HCC)   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HCC)   Type 2 diabetes mellitus with complication, without long-term current use of insulin ([place])   Hyperlipidemia   Sepsis due to COVID-19 ([place])  Sepsis due to covid-19 pneumonia: POA, resolved.  Acute hypoxic respiratory failure due to covid-19 pneumonia: CXR reassuring and weaning down oxygen successfully. Now durably on room air. - Continue remdesivir 1/5 - 1/9 and decadron 1/5 - 1/14.  - Vit C, zinc, avoid NSAIDs, maintain euvolemia.  - Antitussives - Standard dose lovenox DVT ppx. - Vit C, zinc, FV, IS.  Asthma: No wheezing.  - Continue singulair, BDs.   NIDT2DM: HbA1c 7.4% - Continue SSI  HLD:  - Continue statin  Chronic HFpEF: Euvolemic.  - Continue home medications  HTN:  - Continue home medications.   History of CP, quadriplegia: No acute interventions necessary.  - PT/OT consulted  Iron deficiency anemia: Mild. Ferritin low at 13 even in setting of acute infection.  - Initiated supplement with concomitant prn bowel regimen.   RN Pressure Injury Documentation: Pressure Injury 03/21/19 Sacrum Medial Stage 1 -  Intact skin with non-blanchable redness of a localized  area usually over a bony prominence. (Active)  03/21/19 2326  Location: Sacrum  Location Orientation: Medial  Staging: Stage 1 -  Intact skin with non-blanchable redness of a localized area usually over a bony prominence.  Wound Description (Comments):   Present on Admission: Yes   Obesity: BMI 31. Noted  GERD:  - Continue PPI  DVT prophylaxis: Lovenox Code Status: Full Family Communication: Group Home Director updated daily. Disposition Plan: Group Home 1/9 after 5th dose of remdesivir.  Consultants:   None  Procedures:   None  Antimicrobials:  Remdesivir 1/5 - 1/9   Subjective: Feels 100% better. No shortness of breath, chest pain, leg swelling. Eating well. Wants to go home.   Objective: Vitals:   03/23/19 2106 03/24/19 0452 03/24/19 0755 03/24/19 1604  BP: 113/85 (!) 93/47 (!) 104/53 107/62  Pulse: 74 (!) 52 (!) 52 90  Resp: 18 16 16 16   Temp: 97.8 F (36.6 C) 98.1 F (36.7 C) 98.2 F (36.8 C) 97.8 F (36.6 C)  TempSrc: Oral Oral Oral Oral  SpO2: 99% 100% 96% 95%  Weight:      Height:        Intake/Output Summary (Last 24 hours) at 03/24/2019 1657 Last data filed at 03/24/2019 1400 Gross per 24 hour  Intake 960 ml  Output 1650 ml  Net -690 ml   Filed Weights   03/21/19 1310 03/21/19 2350  Weight: 86.2 kg 86.2 kg   Gen: 49 y.o. male in no distress Pulm: Nonlabored breathing room air. Clear. CV: Regular rate and rhythm. No murmur, rub, or gallop. No JVD,  no dependent edema. GI: Abdomen soft, non-tender, non-distended, with normoactive bowel sounds.  Ext: Warm, decreased muscle bulk Skin: No new rashes, lesions or ulcers on visualized skin. Neuro: Alert and oriented. No new focal neurological deficits. Psych: Judgement and insight appear fair. Mood euthymic & affect congruent. Behavior is appropriate.    Data Reviewed: I have personally reviewed following labs and imaging studies  CBC: Recent Labs  Lab 03/21/19 1330 03/21/19 1958 03/22/19 1026  03/23/19 0330 03/24/19 0027  WBC 6.0 5.5 2.3* 2.9* 3.7*  NEUTROABS 4.7  --  1.6* 2.2 2.9  HGB 13.0 12.0* 12.3* 11.6* 11.7*  HCT 40.6 39.3 39.3 36.8* 36.8*  MCV 80.4 82.7 80.0 81.1 80.7  PLT 179 161 154 142* 0000000   Basic Metabolic Panel: Recent Labs  Lab 03/21/19 1330 03/21/19 1958 03/22/19 1026 03/23/19 0330 03/24/19 0027  NA 137  --  140 134* 137  K 4.0  --  4.1 5.0 4.3  CL 103  --  109 103 106  CO2 23  --  20* 17* 21*  GLUCOSE 123*  --  216* 157* 178*  BUN 16  --  21* 23* 25*  CREATININE 0.80 0.85 0.75 0.64 0.74  CALCIUM 8.8*  --  9.2 8.5* 8.4*   GFR: Estimated Creatinine Clearance: 116.5 mL/min (by C-G formula based on SCr of 0.74 mg/dL). Liver Function Tests: Recent Labs  Lab 03/21/19 1330 03/22/19 1026 03/23/19 0330 03/24/19 0027  AST 36 27 26 23   ALT 33 34 27 27  ALKPHOS 120 104 84 84  BILITOT 0.7 0.3 0.8 0.2*  PROT 7.2 6.9 6.5 6.3*  ALBUMIN 4.4 3.9 3.6 3.6   HbA1C: Recent Labs    03/21/19 1958  HGBA1C 7.4*   CBG: Recent Labs  Lab 03/23/19 1618 03/23/19 2112 03/24/19 0752 03/24/19 1134 03/24/19 1603  GLUCAP 220* 170* 124* 104* 235*   Lipid Profile: No results for input(s): CHOL, HDL, LDLCALC, TRIG, CHOLHDL, LDLDIRECT in the last 72 hours. Anemia Panel: No results for input(s): VITAMINB12, FOLATE, FERRITIN, TIBC, IRON, RETICCTPCT in the last 72 hours. Urine analysis:    Component Value Date/Time   COLORURINE YELLOW 03/21/2019 2013   APPEARANCEUR CLEAR 03/21/2019 2013   LABSPEC 1.028 03/21/2019 2013   PHURINE 5.0 03/21/2019 2013   GLUCOSEU >=500 (A) 03/21/2019 2013   HGBUR NEGATIVE 03/21/2019 2013   BILIRUBINUR NEGATIVE 03/21/2019 2013   KETONESUR 5 (A) 03/21/2019 2013   PROTEINUR NEGATIVE 03/21/2019 2013   UROBILINOGEN 1.0 05/26/2014 2211   NITRITE NEGATIVE 03/21/2019 2013   LEUKOCYTESUR NEGATIVE 03/21/2019 2013   Recent Results (from the past 240 hour(s))  Culture, blood (Routine x 2)     Status: None (Preliminary result)    Collection Time: 03/21/19  1:30 PM   Specimen: BLOOD  Result Value Ref Range Status   Specimen Description   Final    BLOOD LEFT ANTECUBITAL Performed at [facility], [place] [address] [place], [place] [address]    Special Requests   Final    BOTTLES DRAWN AEROBIC AND ANAEROBIC Blood Culture adequate volume Performed at [facility] [address], [place], [place] [address]    Culture   Final    NO GROWTH 3 DAYS Performed at [facility], [place] [address], [place], [place] [address]    Report Status PENDING  Incomplete  Culture, blood (Routine x 2)     Status: None (Preliminary result)   Collection Time: 03/21/19  1:30 PM   Specimen: BLOOD  LEFT HAND  Result Value Ref Range Status   Specimen Description   Final    BLOOD LEFT HAND Performed at [facility] [address] [place], [place] [address]    Special Requests   Final    BOTTLES DRAWN AEROBIC AND ANAEROBIC Blood Culture adequate volume Performed at [facility] [address], [place], [place] [address]    Culture   Final    NO GROWTH 3 DAYS Performed at [facility], [place] [address], [place], [place] [address]    Report Status PENDING  Incomplete      Radiology Studies: No results found.  Scheduled Meds: . vitamin C  500 mg Oral Daily  . baclofen  5 mg Oral QHS  . dexamethasone (DECADRON) injection  6 mg Intravenous Q24H  . enoxaparin (LOVENOX) injection  40 mg Subcutaneous Q24H  . ferrous sulfate  325 mg Oral Q breakfast  . insulin aspart  0-15 Units Subcutaneous TID WC  . insulin aspart  0-5 Units Subcutaneous QHS  . montelukast  10 mg Oral Daily  . pantoprazole  40 mg Oral Daily  . rosuvastatin  5 mg Oral QHS  . zinc sulfate  220 mg Oral Daily   Continuous Infusions: . remdesivir 100 mg in NS 100 mL 100 mg (03/24/19 1023)     LOS: 3 days   Time spent: 25 minutes.  Patrecia Pour, MD [facility] [email or web address] 03/24/2019, 4:57 PM

## 2019-03-24 NOTE — TOC Initial Note (Addendum)
Transition of Care [facility]) - Initial/Assessment Note    Patient Details  Name: Thomas Ramos MRN: [id number] Date of Birth: 02-02-1971  Transition of Care [facility]) CM/SW Contact:    Midge Minium MSN, RN, NCM-BC, ACM-RN [phone] Phone Number: 03/24/2019, 3:33 PM  Clinical Narrative:                 Request received to contact [name] (Group Home employee) to discuss if the patient can return back to the facility post-discharge. [name] is requesting to discuss the DCP with the Group Home Director prior to making a determination since some residents have tested (-). The patient may require ST SNF placement prior to returning back to the [facility]. CM team will continue to follow.  ADDENDUM: 03/24/19 @ 1605-CM spoke to [name], the patient can return back to [facility] once he's medically stable. CM will complete an FL2.   Expected Discharge Plan: Group Home Barriers to Discharge: Continued Medical Work up   Patient Goals and CMS Choice        Expected Discharge Plan and Services Expected Discharge Plan: Group Home In-house Referral: NA Discharge Planning Services: CM Consult Post Acute Care Choice: Resumption of Svcs/PTA Provider Living arrangements for the past 2 months: Group Home  Prior Living Arrangements/Services Living arrangements for the past 2 months: Group Home Lives with:: Facility Resident Current home services: DME    Activities of Daily Living Home Assistive Devices/Equipment: Other (Comment)(lives in group home) ADL Screening (condition at time of admission) Patient's cognitive ability adequate to safely complete daily activities?: No Is the patient deaf or have difficulty hearing?: No Does the patient have difficulty seeing, even when wearing glasses/contacts?: No Does the patient have difficulty concentrating, remembering, or making decisions?: No Patient able to express need for assistance with ADLs?: Yes Does the patient have difficulty dressing or bathing?:  Yes Independently performs ADLs?: No Does the patient have difficulty walking or climbing stairs?: Yes Weakness of Legs: Both Weakness of Arms/Hands: Right   Admission diagnosis:  Acute respiratory failure with hypoxia ([place]) [J96.01] Sepsis due to COVID-19 ([place]) [U07.1, A41.89] COVID-19 [U07.1] Patient Active Problem List   Diagnosis Date Noted  . Pneumonia due to COVID-19 virus 03/21/2019  . Sepsis due to COVID-19 ([place]) 03/21/2019  . Hiatal hernia   . Cellulitis of right lower extremity without foot 08/03/2017  . Type 2 diabetes mellitus with complication, without long-term current use of insulin ([name]) 08/03/2017  . PNA (pneumonia) 05/07/2017  . SIRS (systemic inflammatory response syndrome) (HCC)   . Acute respiratory failure with hypoxia ([name]) 03/13/2017  . 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[name])   . Rhinovirus infection 08/17/2016  . Pressure injury of skin 08/16/2016  . Acute respiratory distress 08/15/2016  . Tachycardia 08/15/2016  . Sepsis ([name]) 08/15/2016  . Onychomycosis 02/20/2016  . Essential hypertension   . OSA (obstructive sleep apnea) 12/11/2015  . Chronic diastolic (congestive) heart failure ([name])   . Chronic venous insufficiency   . Depression with anxiety 09/20/2015  . CAP (community acquired pneumonia) 09/20/2015  . Hypersomnia 07/16/2015  . Asthma exacerbation 07/16/2015  . GERD (gastroesophageal reflux disease) 07/16/2015  . Snoring 07/16/2015  . Hematemesis with nausea   . Fever 05/24/2014  . Hyperlipidemia 02/22/2012  . Dysphagia 09/16/2010  . 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 [place] [address] Phone: [phone] Fax: [phone]  [facility]  [place], [place] [address] Phone: [phone] Fax:  [phone]     Social Determinants of Health (SDOH) Interventions    Readmission Risk Interventions No flowsheet data found.

## 2019-03-24 NOTE — Progress Notes (Signed)
Occupational Therapy Treatment/Discharge Patient Details Name: Thomas Ramos MRN: [id number] DOB: Dec 01, 1970 Today's Date: 03/24/2019    History of present illness [employer] Hulinis a 49 y.o.malewith PMH significant ofquadriplegia, CP, HTN, CHF, NIDT2DM who presented from group home with cough, dyspnea and found to be covid-19 antigen-positive with hypoxia requiring NRB despite nonacute chest xray findings. He was febrile and tachycardic as well as tachypneic. He was admitted 1/5 to [facility], started on remdesivir and steroids   OT comments  Practiced self feeding with set up at bed level.  He was able to hold spoon with fisted grip and complete hand to mouth with no spillage.  Patient stated he was happy about the adaptive utensils and felt comfortable using them.  Patient reached all goals and is ready for discharge from OT.    Follow Up Recommendations  No OT follow up    Equipment Recommendations       Recommendations for Other Services      Precautions / Restrictions         Mobility Bed Mobility                  Transfers                      Balance                                           ADL either performed or assessed with clinical judgement   ADL Overall ADL's : At baseline                                       General ADL Comments: adaptive utensils at bed level, required setup     Vision       Perception     Praxis      Cognition Arousal/Alertness: Awake/alert Behavior During Therapy: WFL for tasks assessed/performed Overall Cognitive Status: Within Functional Limits for tasks assessed                                          Exercises     Shoulder Instructions       General Comments      Pertinent Vitals/ Pain       Pain Assessment: No/denies pain  Home Living                                          Prior Functioning/Environment               Frequency           Progress Toward Goals  OT Goals(current goals can now be found in the care plan section)  Progress towards OT goals: Goals met/education completed, patient discharged from [place] OT "6 Clicks" Daily Activity     Outcome Measure  End of Session Equipment Utilized During Treatment: Other (comment)(Adaptive utensils)  OT Visit Diagnosis: Muscle weakness (generalized) (M62.81)   Activity Tolerance Patient tolerated treatment well   Patient Left in bed;with call bell/phone within reach   Nurse Communication Other (comment)(Lunch set up)        Time: 1148-1212 OT Time Calculation (min): 24 min  Charges: OT General Charges $OT Visit: 1 Visit OT Treatments $Self Care/Home Management : 23-37 mins  Alexandra Castro, OTR/L   Alexandra M Castro 03/24/2019, 12:53 PM    

## 2019-03-24 NOTE — NC FL2 (Signed)
[place] LEVEL OF CARE SCREENING TOOL     IDENTIFICATION  Patient Name: Thomas Ramos Birthdate: [birth year] Sex: male Admission Date (Current Location): 03/21/2019  [place] and [place] Number:  Kathleen Argue [id number] [place] and Address:  [facility], [place] [address] [place], [place] [address]      Provider Number: [id number]  Attending Physician Name and Address:  Patrecia Pour, MD  Relative Name and Phone Number:  [name] [phone]    Current Level of Care: Hospital Recommended Level of Care: Other (Comment)(Group Home) Prior Approval Number:    Date Approved/Denied:   PASRR Number: [id number]  Discharge Plan: Other (Comment)(Group Home)    Current Diagnoses: Patient Active Problem List   Diagnosis Date Noted  . Pneumonia due to COVID-19 virus 03/21/2019  . Sepsis due to COVID-19 ([place]) 03/21/2019  . Hiatal hernia   . Cellulitis of right lower extremity without foot 08/03/2017  . Type 2 diabetes mellitus with complication, without long-term current use of insulin ([place]) 08/03/2017  . PNA (pneumonia) 05/07/2017  . SIRS (systemic inflammatory response syndrome) (HCC)   . Acute respiratory failure with hypoxia ([place]) 03/13/2017  . 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 Rhinovirus infection 08/17/2016  . Pressure injury of skin 08/16/2016  . Acute respiratory distress 08/15/2016  . Tachycardia 08/15/2016  . Sepsis ([place]) 08/15/2016  . Onychomycosis 02/20/2016  . Essential hypertension   . OSA (obstructive sleep apnea) 12/11/2015  . Chronic diastolic (congestive) heart failure ([place])   . Chronic venous insufficiency   . Depression with anxiety 09/20/2015  . CAP (community acquired pneumonia) 09/20/2015  . Hypersomnia 07/16/2015  . Asthma exacerbation 07/16/2015  . GERD (gastroesophageal reflux disease) 07/16/2015  . Snoring 07/16/2015  . Hematemesis with nausea   . Fever 05/24/2014  . Hyperlipidemia 02/22/2012  .  Dysphagia 09/16/2010  . 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[place]) 09/16/2010    Orientation RESPIRATION BLADDER Height & Weight     Self, Time, Situation, Place  Normal Incontinent, External catheter Weight: 86.2 kg Height:  5\' 5"  (165.1 cm)  BEHAVIORAL SYMPTOMS/MOOD NEUROLOGICAL BOWEL NUTRITION STATUS  Other (Comment)(N/A)   Incontinent Diet(see discharge summary)  AMBULATORY STATUS COMMUNICATION OF NEEDS Skin   Total Care Verbally PU Stage and Appropriate Care PU Stage 1 Dressing: (PRN)                     Personal Care Assistance Level of Assistance  Bathing, Feeding, Dressing Bathing Assistance: Maximum assistance Feeding assistance: Maximum assistance Dressing Assistance: Maximum assistance     Functional Limitations Info  Sight, Hearing, Speech Sight Info: Adequate Hearing Info: Adequate Speech Info: Adequate    SPECIAL CARE FACTORS FREQUENCY  OT (By licensed OT)       OT Frequency: Min 1X/week(one additional time for equipment check)            Contractures Contractures Info: Present(bilateral upper and lower extremities)    Additional Factors Info  Code Status, Allergies Code Status Info: Full Code Allergies Info: PCN, Sulfamethoxazole-trimethoprim, Metronidazole           Current Medications (03/24/2019):  This is the current hospital active medication list Current Facility-Administered Medications  Medication Dose Route Frequency Provider Last Rate Last Admin  . albuterol (VENTOLIN HFA) 108 (90 Base) MCG/ACT inhaler 1-2 puff  1-2 puff Inhalation Q4H PRN Patrecia Pour, MD      . ascorbic acid (VITAMIN C) tablet 500 mg  500 mg Oral Daily Patrecia Pour,  MD   500 mg at 03/24/19 1021  . baclofen (LIORESAL) tablet 5 mg  5 mg Oral QHS Patrecia Pour, MD   5 mg at 03/23/19 2115  . chlorpheniramine-HYDROcodone (TUSSIONEX) 10-8 MG/5ML suspension 5 mL  5 mL Oral Q12H PRN Patrecia Pour, MD      . dexamethasone (DECADRON) injection 6 mg  6 mg Intravenous Q24H Patrecia Pour, MD   6 mg at 03/23/19 1839  . enoxaparin (LOVENOX) injection 40 mg  40 mg Subcutaneous Q24H Patrecia Pour, MD   40 mg at 03/23/19 2115  . ferrous sulfate tablet 325 mg  325 mg Oral Q breakfast Patrecia Pour, MD   325 mg at 03/24/19 [id number]  . guaiFENesin-dextromethorphan (ROBITUSSIN DM) 100-10 MG/5ML syrup 10 mL  10 mL Oral Q4H PRN Vance Gather B, MD      . insulin aspart (novoLOG) injection 0-15 Units  0-15 Units Subcutaneous TID WC Patrecia Pour, MD   2 Units at 03/24/19 0827  . insulin aspart (novoLOG) injection 0-5 Units  0-5 Units Subcutaneous QHS Patrecia Pour, MD   Stopped at 03/23/19 2114  . latanoprost (XALATAN) 0.005 % ophthalmic solution 1 drop  1 drop Both Eyes Daily PRN Patrecia Pour, MD      . montelukast (SINGULAIR) tablet 10 mg  10 mg Oral Daily Patrecia Pour, MD   10 mg at 03/24/19 1022  . nitroGLYCERIN (NITROSTAT) SL tablet 0.4 mg  0.4 mg Sublingual Q5 min PRN Patrecia Pour, MD      . pantoprazole (PROTONIX) EC tablet 40 mg  40 mg Oral Daily Patrecia Pour, MD   40 mg at 03/24/19 1022  . remdesivir 100 mg in sodium chloride 0.9 % 100 mL IVPB  100 mg Intravenous Daily Vance Gather B, MD 200 mL/hr at 03/24/19 1023 100 mg at 03/24/19 1023  . rosuvastatin (CRESTOR) tablet 5 mg  5 mg Oral QHS Patrecia Pour, MD   5 mg at 03/23/19 2114  . senna-docusate (Senokot-S) tablet 1 tablet  1 tablet Oral QHS PRN Patrecia Pour, MD      . zinc sulfate capsule 220 mg  220 mg Oral Daily Patrecia Pour, MD   220 mg at 03/24/19 1022     Discharge Medications: Please see discharge summary for a list of discharge medications.  Relevant Imaging Results:  Relevant Lab Results:   Additional Information SSN: [id number]  Midge Minium MSN, RN, NCM-BC, ACM-RN [phone]

## 2019-03-25 LAB — GLUCOSE, CAPILLARY
Glucose-Capillary: 142 mg/dL — ABNORMAL HIGH (ref 70–99)
Glucose-Capillary: 148 mg/dL — ABNORMAL HIGH (ref 70–99)

## 2019-03-25 NOTE — Clinical Social Work Note (Signed)
Pt has no ride back to group home [address] Thomas Ramos [phone]. PTAR has been arranged. RN notified.  [name], [place]

## 2019-03-25 NOTE — Discharge Summary (Signed)
Physician Discharge Summary  Thomas Ramos [id number] DOB: [birth year] DOA: 03/21/2019  PCP: Chesley Noon, MD  Admit date: 03/21/2019 Discharge date: 03/25/2019  Admitted From: ALF Disposition: ALF/group home   Recommendations for Outpatient Follow-up:  1. Follow up with PCP in 1-2 weeks 2. Please obtain CMP/CBC in one week  Home Health: None new Equipment/Devices: None new Discharge Condition: Stable, improved CODE STATUS: Full Diet recommendation: Heart healthy, carb-modified  Brief/Interim Summary: Thomas Ramos a 49 y.o.malewith medical history significant ofquadriplegia, CP, HTN, CHF, NIDT2DM who presented from group home with cough, dyspnea and found to be covid-19 antigen-positive with hypoxia requiring NRB despite nonacute chest xray findings. He was febrile and tachycardic as well as tachypneic. He was admitted 1/5 to [facility], started on remdesivir and steroids. He was weaned from supplemental oxygen quickly and stabilized in time to be discharged after completing remdesivir. To avoid steroid-induced hyperglycemia, further steroids were not continued at discharge.   Discharge Diagnoses:  Principal Problem:   Pneumonia due to COVID-19 virus Active Problems:   Chronic diastolic (congestive) heart failure (HCC)   Essential hypertension   Tachycardia   Sepsis ([place])   Pressure injury of skin   Acute respiratory failure with hypoxia (HCC)   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HCC)   Type 2 diabetes mellitus with complication, without long-term current use of insulin ([place])   Hyperlipidemia   Sepsis due to COVID-19 ([place])  Sepsis due to covid-19 pneumonia: POA, resolved.  Acute hypoxic respiratory failure due to covid-19 pneumonia: CXR reassuring and weaning down oxygen successfully. Now durably on room air. - Completed remdesivir 1/5 - 1/9 and decadron 1/5 - 1/9 - 21 day isolation recommended per CDC guidelines.  Asthma: No wheezing.  - Continue singulair, BDs.    NIDT2DM: HbA1c 7.4% - Continue home medications. To avoid steroid-induced hyperglycemia, given his rapid improvement in hypoxia, we will not continue steroids at discharge.  HLD:  - Continue statin  Chronic HFpEF: Euvolemic.  - Continue home medications  HTN:  - Continue home medications.   History of CP, quadriplegia: No acute interventions necessary.  - PT/OT consulted, at baseline.  Iron deficiency anemia: Mild. Ferritin low at 13 even in setting of acute infection.  - Initiated supplement with concomitant prn bowel regimen while admitted, suggest recheck CBC and consideration of starting long term iron supplement.  RN Pressure Injury Documentation: Pressure Injury 03/21/19 Sacrum Medial Stage 1 -  Intact skin with non-blanchable redness of a localized area usually over a bony prominence. (Active) 03/21/19 2326 Location: Sacrum Location Orientation: Medial Staging: Stage 1 -  Intact skin with non-blanchable redness of a localized area usually over a bony prominence. Wound Description (Comments):  Present on Admission: Yes  Obesity: BMI 31. Noted  GERD:  - Continue PPI  Discharge Instructions Discharge Instructions    Diet Carb Modified   Complete by: As directed    Discharge instructions   Complete by: As directed    You are being discharged from the hospital after treatment for covid-19 infection. You are felt to be stable enough to no longer require inpatient monitoring, testing, and treatment, though you will need to follow the recommendations below: - Continue taking medications as you were. Because of your rapid improvement, you no longer require steroid treatment and have completed standard therapy of remdesivir prior to discharge.  - CDC guidelines recommend a 21 day period of isolation from the first positive test. After this period you may return to normal precautions including masking. - Do  not take NSAID medications (including, but not limited to,  ibuprofen, advil, motrin, naproxen, aleve, goody's powder, etc.) - Follow up with your doctor in the next week via telehealth or seek medical attention right away if your symptoms get WORSE.  - Consider donating plasma after you have recovered (either 14 days after a negative test or 28 days after symptoms have completely resolved) because your antibodies to this virus may be helpful to give to others with life-threatening infections. Please go to the website www.[email or web address] if you would like to consider volunteering for plasma donation.    Directions for you at home:  Wear a facemask You should wear a facemask that covers your nose and mouth when you are in the same room with other people and when you visit a healthcare provider. People who live with or visit you should also wear a facemask while they are in the same room with you.  Separate yourself from other people in your home As much as possible, you should stay in a different room from other people in your home. Also, you should use a separate bathroom, if available.  Avoid sharing household items You should not share dishes, drinking glasses, cups, eating utensils, towels, bedding, or other items with other people in your home. After using these items, you should wash them thoroughly with soap and water.  Cover your coughs and sneezes Cover your mouth and nose with a tissue when you cough or sneeze, or you can cough or sneeze into your sleeve. Throw used tissues in a lined trash can, and immediately wash your hands with soap and water for at least 20 seconds or use an alcohol-based hand rub.  Wash your [facility] your hands often and thoroughly with soap and water for at least 20 seconds. You can use an alcohol-based hand sanitizer if soap and water are not available and if your hands are not visibly dirty. Avoid touching your eyes, nose, and mouth with unwashed hands.  Directions for those who live with, or provide care at home  for you:  Limit the number of people who have contact with the patient If possible, have only one caregiver for the patient. Other household members should stay in another home or place of residence. If this is not possible, they should stay in another room, or be separated from the patient as much as possible. Use a separate bathroom, if available. Restrict visitors who do not have an essential need to be in the home.  Ensure good ventilation Make sure that shared spaces in the home have good air flow, such as from an air conditioner or an opened window, weather permitting.  Wash your hands often Wash your hands often and thoroughly with soap and water for at least 20 seconds. You can use an alcohol based hand sanitizer if soap and water are not available and if your hands are not visibly dirty. Avoid touching your eyes, nose, and mouth with unwashed hands. Use disposable paper towels to dry your hands. If not available, use dedicated cloth towels and replace them when they become wet.  Wear a facemask and gloves Wear a disposable facemask at all times in the room and gloves when you touch or have contact with the patient's blood, body fluids, and/or secretions or excretions, such as sweat, saliva, sputum, nasal mucus, vomit, urine, or feces.  Ensure the mask fits over your nose and mouth tightly, and do not touch it during use. Throw out disposable facemasks and gloves  after using them. Do not reuse. Wash your hands immediately after removing your facemask and gloves. If your personal clothing becomes contaminated, carefully remove clothing and launder. Wash your hands after handling contaminated clothing. Place all used disposable facemasks, gloves, and other waste in a lined container before disposing them with other household waste. Remove gloves and wash your hands immediately after handling these items.  Do not share dishes, glasses, or other household items with the patient Avoid  sharing household items. You should not share dishes, drinking glasses, cups, eating utensils, towels, bedding, or other items with a patient who is confirmed to have, or being evaluated for, COVID-19 infection. After the person uses these items, you should wash them thoroughly with soap and water.  Wash laundry thoroughly Immediately remove and wash clothes or bedding that have blood, body fluids, and/or secretions or excretions, such as sweat, saliva, sputum, nasal mucus, vomit, urine, or feces, on them. Wear gloves when handling laundry from the patient. Read and follow directions on labels of laundry or clothing items and detergent. In general, wash and dry with the warmest temperatures recommended on the label.  Clean all areas the individual has used often Clean all touchable surfaces, such as counters, tabletops, doorknobs, bathroom fixtures, toilets, phones, keyboards, tablets, and bedside tables, every day. Also, clean any surfaces that may have blood, body fluids, and/or secretions or excretions on them. Wear gloves when cleaning surfaces the patient has come in contact with. Use a diluted bleach solution (e.g., dilute bleach with 1 part bleach and 10 parts water) or a household disinfectant with a label that says EPA-registered for coronaviruses. To make a bleach solution at home, add 1 tablespoon of bleach to 1 quart (4 cups) of water. For a larger supply, add  cup of bleach to 1 gallon (16 cups) of water. Read labels of cleaning products and follow recommendations provided on product labels. Labels contain instructions for safe and effective use of the cleaning product including precautions you should take when applying the product, such as wearing gloves or eye protection and making sure you have good ventilation during use of the product. Remove gloves and wash hands immediately after cleaning.  Monitor yourself for signs and symptoms of illness Caregivers and household members are  considered close contacts, should monitor their health, and will be asked to limit movement outside of the home to the extent possible. Follow the monitoring steps for close contacts listed on the symptom monitoring form.  If you have additional questions, contact your local health department or call the epidemiologist on call at [phone] (available 24/7). This guidance is subject to change. For the most up-to-date guidance from [facility], please refer to their website: YouBlogs.pl   Increase activity slowly   Complete by: As directed      Allergies as of 03/25/2019      Reactions   Penicillins Hives, Nausea And Vomiting, Other (See Comments)   Patient tolerated cefazolin in 2017 Has patient had a PCN reaction causing immediate rash, facial/tongue/throat swelling, SOB or lightheadedness with hypotension: Yes Has patient had a PCN reaction causing severe rash involving mucus membranes or skin necrosis: No Has patient had a PCN reaction that required hospitalization No Has patient had a PCN reaction occurring within the last 10 years: Yes If all of the above answers are "NO", then may proceed with Cephalosporin use.   Sulfamethoxazole-trimethoprim Nausea And Vomiting   Metronidazole Nausea And Vomiting      Medication List    TAKE  these medications   amLODipine 10 MG tablet Commonly known as: NORVASC Take 10 mg by mouth daily.   aspirin 81 MG chewable tablet Chew by mouth daily.   baclofen 10 MG tablet Commonly known as: LIORESAL Take 5 mg by mouth at bedtime.   budesonide-formoterol 80-4.5 MCG/ACT inhaler Commonly known as: SYMBICORT Inhale 2 puffs into the lungs 2 (two) times daily.   [employer] Clotrimazole 1 % cream Generic drug: clotrimazole Apply 1 application topically 2 (two) times daily as needed (rash). BETWEEN TOES   furosemide 20 MG tablet Commonly known as: LASIX Take 20 mg by mouth daily.   Jardiance 10  MG Tabs tablet Generic drug: empagliflozin Take 10 mg by mouth daily.   latanoprost 0.005 % ophthalmic solution Commonly known as: XALATAN Place 1 drop into both eyes daily as needed (dry eyes).   metFORMIN 500 MG 24 hr tablet Commonly known as: GLUCOPHAGE-XR Take 250 mg by mouth at bedtime.   montelukast 10 MG tablet Commonly known as: SINGULAIR Take 10 mg by mouth daily.   mupirocin ointment 2 % Commonly known as: BACTROBAN Apply 1 application topically daily as needed (rash).   naproxen sodium 220 MG tablet Commonly known as: ALEVE Take 220-440 mg by mouth 2 (two) times daily as needed (for pain).   nitroGLYCERIN 0.4 MG SL tablet Commonly known as: NITROSTAT Place 0.4 mg under the tongue every 5 (five) minutes as needed for chest pain.   omeprazole 20 MG capsule Commonly known as: PRILOSEC Take 20 mg by mouth daily.   ondansetron 4 MG tablet Commonly known as: ZOFRAN Take 1 tablet (4 mg total) by mouth every 6 (six) hours. What changed:   when to take this  reasons to take this   potassium chloride 10 MEQ tablet Commonly known as: KLOR-CON Take 10 mEq by mouth daily.   rosuvastatin 5 MG tablet Commonly known as: CRESTOR Take 5 mg by mouth at bedtime.   valsartan 80 MG tablet Commonly known as: DIOVAN Take 80 mg by mouth daily.      Follow-up Information    Chesley Noon, MD. Schedule an appointment as soon as possible for a visit in 1 week(s).   Specialty: Family Medicine Contact information: [place] [phone]          Allergies  Allergen Reactions  . Penicillins Hives, Nausea And Vomiting and Other (See Comments)    Patient tolerated cefazolin in 2017 Has patient had a PCN reaction causing immediate rash, facial/tongue/throat swelling, SOB or lightheadedness with hypotension: Yes Has patient had a PCN reaction causing severe rash involving mucus membranes or skin necrosis: No Has patient had a PCN  reaction that required hospitalization No Has patient had a PCN reaction occurring within the last 10 years: Yes If all of the above answers are "NO", then may proceed with Cephalosporin use.  . Sulfamethoxazole-Trimethoprim Nausea And Vomiting  . Metronidazole Nausea And Vomiting    Consultations:  None  Procedures/Studies: DG Chest Port 1 View  Result Date: 03/21/2019 CLINICAL DATA:  Shortness of breath, cough and fever. EXAM: PORTABLE CHEST 1 VIEW COMPARISON:  December 24, 2017 FINDINGS: Persistent volume loss is seen in the right with mild, stable elevation of the right hemidiaphragm. Mild, stable chronic appearing increased lung markings are noted without evidence of acute infiltrate, pleural effusion or pneumothorax. The heart size and mediastinal contours are within normal limits. There is moderate severity levoscoliosis of the thoracic spine. IMPRESSION: 1. Stable exam without  evidence of acute or active cardiopulmonary disease. Electronically Signed   By: Virgina Norfolk M.D.   On: 03/21/2019 15:18     Subjective: Feels well, no dyspnea or other complaints.   Discharge Exam: Vitals:   03/25/19 0000 03/25/19 0400  BP:  119/67  Pulse:  (!) 51  Resp: 18 20  Temp:  (!) 97.5 F (36.4 C)  SpO2:     General: Pt is alert, awake, not in acute distress Respiratory: Clear and nonlabored  Labs: Basic Metabolic Panel: Recent Labs  Lab 03/21/19 1330 03/21/19 1958 03/22/19 1026 03/23/19 0330 03/24/19 0027  NA 137  --  140 134* 137  K 4.0  --  4.1 5.0 4.3  CL 103  --  109 103 106  CO2 23  --  20* 17* 21*  GLUCOSE 123*  --  216* 157* 178*  BUN 16  --  21* 23* 25*  CREATININE 0.80 0.85 0.75 0.64 0.74  CALCIUM 8.8*  --  9.2 8.5* 8.4*   Liver Function Tests: Recent Labs  Lab 03/21/19 1330 03/22/19 1026 03/23/19 0330 03/24/19 0027  AST 36 27 26 23   ALT 33 34 27 27  ALKPHOS 120 104 84 84  BILITOT 0.7 0.3 0.8 0.2*  PROT 7.2 6.9 6.5 6.3*  ALBUMIN 4.4 3.9 3.6 3.6    No results for input(s): LIPASE, AMYLASE in the last 168 hours. No results for input(s): AMMONIA in the last 168 hours. CBC: Recent Labs  Lab 03/21/19 1330 03/21/19 1958 03/22/19 1026 03/23/19 0330 03/24/19 0027  WBC 6.0 5.5 2.3* 2.9* 3.7*  NEUTROABS 4.7  --  1.6* 2.2 2.9  HGB 13.0 12.0* 12.3* 11.6* 11.7*  HCT 40.6 39.3 39.3 36.8* 36.8*  MCV 80.4 82.7 80.0 81.1 80.7  PLT 179 161 154 142* 179   Cardiac Enzymes: No results for input(s): CKTOTAL, CKMB, CKMBINDEX, TROPONINI in the last 168 hours. BNP: Invalid input(s): POCBNP CBG: Recent Labs  Lab 03/23/19 2112 03/24/19 0752 03/24/19 1134 03/24/19 1603 03/24/19 2001  GLUCAP 170* 124* 104* 235* 202*   D-Dimer No results for input(s): DDIMER in the last 72 hours. Hgb A1c No results for input(s): HGBA1C in the last 72 hours. Lipid Profile No results for input(s): CHOL, HDL, LDLCALC, TRIG, CHOLHDL, LDLDIRECT in the last 72 hours. Thyroid function studies No results for input(s): TSH, T4TOTAL, T3FREE, THYROIDAB in the last 72 hours.  Invalid input(s): FREET3 Anemia work up No results for input(s): VITAMINB12, FOLATE, FERRITIN, TIBC, IRON, RETICCTPCT in the last 72 hours. Urinalysis    Component Value Date/Time   COLORURINE YELLOW 03/21/2019 2013   APPEARANCEUR CLEAR 03/21/2019 2013   LABSPEC 1.028 03/21/2019 2013   PHURINE 5.0 03/21/2019 2013   GLUCOSEU >=500 (A) 03/21/2019 2013   HGBUR NEGATIVE 03/21/2019 2013   BILIRUBINUR NEGATIVE 03/21/2019 2013   KETONESUR 5 (A) 03/21/2019 2013   PROTEINUR NEGATIVE 03/21/2019 2013   UROBILINOGEN 1.0 05/26/2014 2211   NITRITE NEGATIVE 03/21/2019 2013   LEUKOCYTESUR NEGATIVE 03/21/2019 2013    Microbiology Recent Results (from the past 240 hour(s))  Culture, blood (Routine x 2)     Status: None (Preliminary result)   Collection Time: 03/21/19  1:30 PM   Specimen: BLOOD  Result Value Ref Range Status   Specimen Description   Final    BLOOD LEFT ANTECUBITAL Performed at  [facility], [place] [address] [place], [place] [address]    Special Requests   Final    BOTTLES DRAWN AEROBIC AND ANAEROBIC Blood  Culture adequate volume Performed at [facility] [address] [place], [place] [address]    Culture   Final    NO GROWTH 4 DAYS Performed at [facility], [place] [address], [place], [place] [address]    Report Status PENDING  Incomplete  Culture, blood (Routine x 2)     Status: None (Preliminary result)   Collection Time: 03/21/19  1:30 PM   Specimen: BLOOD LEFT HAND  Result Value Ref Range Status   Specimen Description   Final    BLOOD LEFT HAND Performed at [facility] [address], [place], [place] [address]    Special Requests   Final    BOTTLES DRAWN AEROBIC AND ANAEROBIC Blood Culture adequate volume Performed at [facility] [address], [place], [place] [address]    Culture   Final    NO GROWTH 4 DAYS Performed at [facility], [place] [address], [place], [place] [address]    Report Status PENDING  Incomplete    Time coordinating discharge: Approximately 40 minutes  Patrecia Pour, MD  [facility] 03/25/2019, 6:47 AM

## 2019-03-25 NOTE — Progress Notes (Signed)
Patient taken by ambulance to  Group home

## 2019-03-25 NOTE — Progress Notes (Signed)
notified Bryan at group home, bryan states pt "does not have legal guardian and he is his on guardian." [name] states pt does not have transportation and will need assistance getting home. notified case worker that pt will need ptar

## 2019-03-25 NOTE — Plan of Care (Signed)
Rested well all night.  No respiratory issues.  Excited for discharge.  Problem: Coping: Goal: Psychosocial and spiritual needs will be supported Outcome: Progressing   Problem: Respiratory: Goal: Will maintain a patent airway Outcome: Progressing Goal: Complications related to the disease process, condition or treatment will be avoided or minimized Outcome: Progressing   Problem: Health Behavior/Discharge Planning: Goal: Ability to manage health-related needs will improve Outcome: Progressing   Problem: Activity: Goal: Risk for activity intolerance will decrease Outcome: Progressing   Problem: Nutrition: Goal: Adequate nutrition will be maintained Outcome: Progressing   Problem: Coping: Goal: Level of anxiety will decrease Outcome: Progressing   Problem: Elimination: Goal: Will not experience complications related to bowel motility Outcome: Progressing Goal: Will not experience complications related to urinary retention Outcome: Progressing   Problem: Safety: Goal: Ability to remain free from injury will improve Outcome: Progressing   Problem: Skin Integrity: Goal: Risk for impaired skin integrity will decrease Outcome: Progressing

## 2019-03-26 LAB — CULTURE, BLOOD (ROUTINE X 2)
Culture: NO GROWTH
Culture: NO GROWTH
Special Requests: ADEQUATE
Special Requests: ADEQUATE

## 2019-03-27 ENCOUNTER — Emergency Department (HOSPITAL_COMMUNITY)
Admission: EM | Admit: 2019-03-27 | Discharge: 2019-03-28 | Disposition: A | Discharge status: Home / Self Care | Payer: Medicare Other | Attending: Emergency Medicine | Admitting: Emergency Medicine

## 2019-03-27 ENCOUNTER — Encounter (HOSPITAL_COMMUNITY): Payer: Self-pay | Admitting: Student

## 2019-03-27 DIAGNOSIS — Z7984 Long term (current) use of oral hypoglycemic drugs: Secondary | ICD-10-CM | POA: Diagnosis not present

## 2019-03-27 DIAGNOSIS — Z7982 Long term (current) use of aspirin: Secondary | ICD-10-CM | POA: Diagnosis not present

## 2019-03-27 DIAGNOSIS — Z87891 Personal history of nicotine dependence: Secondary | ICD-10-CM | POA: Diagnosis not present

## 2019-03-27 DIAGNOSIS — Z79899 Other long term (current) drug therapy: Secondary | ICD-10-CM | POA: Insufficient documentation

## 2019-03-27 DIAGNOSIS — I5032 Chronic diastolic (congestive) heart failure: Secondary | ICD-10-CM | POA: Diagnosis not present

## 2019-03-27 DIAGNOSIS — I110 Hypertensive heart disease with heart failure: Secondary | ICD-10-CM | POA: Insufficient documentation

## 2019-03-27 DIAGNOSIS — J45909 Unspecified asthma, uncomplicated: Secondary | ICD-10-CM | POA: Diagnosis not present

## 2019-03-27 DIAGNOSIS — R112 Nausea with vomiting, unspecified: Secondary | ICD-10-CM | POA: Diagnosis not present

## 2019-03-27 DIAGNOSIS — E119 Type 2 diabetes mellitus without complications: Secondary | ICD-10-CM | POA: Diagnosis not present

## 2019-03-27 LAB — COMPREHENSIVE METABOLIC PANEL
ALT: 41 U/L (ref 0–44)
AST: 23 U/L (ref 15–41)
Albumin: 3.6 g/dL (ref 3.5–5.0)
Alkaline Phosphatase: 83 U/L (ref 38–126)
Anion gap: 13 (ref 5–15)
BUN: 19 mg/dL (ref 6–20)
CO2: 20 mmol/L — ABNORMAL LOW (ref 22–32)
Calcium: 8.8 mg/dL — ABNORMAL LOW (ref 8.9–10.3)
Chloride: 106 mmol/L (ref 98–111)
Creatinine, Ser: 0.95 mg/dL (ref 0.61–1.24)
GFR calc Af Amer: >60 mL/min (ref >60)
GFR calc non Af Amer: >60 mL/min (ref >60)
Glucose, Bld: 220 mg/dL — ABNORMAL HIGH (ref 70–99)
Potassium: 3.7 mmol/L (ref 3.5–5.1)
Sodium: 139 mmol/L (ref 135–145)
Total Bilirubin: 0.8 mg/dL (ref 0.3–1.2)
Total Protein: 6.8 g/dL (ref 6.5–8.1)

## 2019-03-27 LAB — CBC WITH DIFFERENTIAL/PLATELET
Abs Immature Granulocytes: 0.15 10*3/uL — ABNORMAL HIGH (ref 0.00–0.07)
Basophils Absolute: 0 10*3/uL (ref 0.0–0.1)
Basophils Relative: 0 %
Eosinophils Absolute: 0 10*3/uL (ref 0.0–0.5)
Eosinophils Relative: 0 %
HCT: 45.6 % (ref 39.0–52.0)
Hemoglobin: 14.4 g/dL (ref 13.0–17.0)
Immature Granulocytes: 1 %
Lymphocytes Relative: 7 %
Lymphs Abs: 0.8 10*3/uL (ref 0.7–4.0)
MCH: 25.3 pg — ABNORMAL LOW (ref 26.0–34.0)
MCHC: 31.6 g/dL (ref 30.0–36.0)
MCV: 80 fL (ref 80.0–100.0)
Monocytes Absolute: 0.9 10*3/uL (ref 0.1–1.0)
Monocytes Relative: 8 %
Neutro Abs: 10 10*3/uL — ABNORMAL HIGH (ref 1.7–7.7)
Neutrophils Relative %: 84 %
Platelets: 241 10*3/uL (ref 150–400)
RBC: 5.7 MIL/uL (ref 4.22–5.81)
RDW: 14 % (ref 11.5–15.5)
WBC: 11.8 10*3/uL — ABNORMAL HIGH (ref 4.0–10.5)
nRBC: 0 % (ref 0.0–0.2)

## 2019-03-27 LAB — LIPASE, BLOOD: Lipase: 24 U/L (ref 11–51)

## 2019-03-27 MED ORDER — SODIUM CHLORIDE 0.9 % IV BOLUS
500.0000 mL | Freq: Once | INTRAVENOUS | Status: AC
  Administered 2019-03-27: 500 mL via INTRAVENOUS

## 2019-03-27 MED ORDER — ONDANSETRON 4 MG PO TBDP: 4.0000 mg | ORAL_TABLET | Freq: Three times a day (TID) | ORAL | 0 refills | Status: DC | PRN

## 2019-03-27 MED ORDER — ONDANSETRON HCL 4 MG/2ML IJ SOLN
4.0000 mg | Freq: Once | INTRAMUSCULAR | Status: AC
  Administered 2019-03-27: 4 mg via INTRAVENOUS
  Filled 2019-03-27: qty 2

## 2019-03-27 NOTE — ED Triage Notes (Signed)
Pt arrives via EMS from a group home w/ c/o N/V beginning this evening. 2-3 episodes vomiting per EMS. Pt hx cerebral palsey, COVID positive 1 wk ago, pneumonia. VS stable, NAD noted, Pt A&Ox4.

## 2019-03-27 NOTE — Discharge Instructions (Addendum)
You were seen in the emergency department today for nausea and vomiting.  Your work-up was overall reassuring.  Your blood sugar was somewhat high at 220, please be sure to monitor this at home and follow-up with your primary care provider for recheck within the next few days.  We are sending you home with Zofran to take every 8 hours as needed for nausea and vomiting.  Please follow a bland diet for the next 24 to 48 hours.    We have prescribed you new medication(s) today. Discuss the medications prescribed today with your pharmacist as they can have adverse effects and interactions with your other medicines including over the counter and prescribed medications. Seek medical evaluation if you start to experience new or abnormal symptoms after taking one of these medicines, seek care immediately if you start to experience difficulty breathing, feeling of your throat closing, facial swelling, or rash as these could be indications of a more serious allergic reaction  Please follow-up with your primary care provider within 3 days.  Return to the ER for new or worsening symptoms including but not limited to abdominal pain, inability to keep fluids down, inability to have a bowel movement, fever, blood in your stool, blood in vomit, issues urinating, trouble breathing, or any other concerns.

## 2019-03-27 NOTE — ED Provider Notes (Signed)
[facility] EMERGENCY DEPARTMENT Provider Note   CSN: [id number] Arrival date & time: 03/27/19  2047     History Chief Complaint  Patient presents with  . Nausea  . Vomiting    Thomas Ramos is a 49 y.o. male with a history of cerebral palsy, quadriplegia, CHF last EF 65-70%, T2DM,&  Hypertension who presents to the ED from his group home for evaluation of N/V after dinner tonight. Patient states he was feeling well throughout the day today, he ate steak for dinner which lead to nausea with 2-3 episodes of non bloody non bilious emesis. He currently is feeling better, requesting something to drink. No alleviating/aggravating factors. He denies fever, hematemesis, melena, hematochezia, diarrhea, or testicular pain/swelling. Last BM was yesterday and was normal, states it is not atypical for him to have a BM every other day, he is passing gas. Patient states he urinates on his own, does not require caths, denies dysuria/urgency/frequency.   HPI     Past Medical History:  Diagnosis Date  . Anxiety   . Cellulitis 08/04/2017  . Cerebral palsy ([place])   . Chronic diastolic (congestive) heart failure ([place])   . CP (cerebral palsy) ([place])   . Diabetes mellitus without complication (HCC)    borderline  . Environmental allergies    takes inhalers if needed  . Esophageal stricture   . GERD (gastroesophageal reflux disease)   . Hypertension   . Motility disorder, esophageal   . Pneumonia   . 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 S/P Botox injection    approx every 4 months  . Seasonal allergies     Patient Active Problem List   Diagnosis Date Noted  . Pneumonia due to COVID-19 virus 03/21/2019  . COVID-19 03/21/2019  . Hiatal hernia   . Cellulitis of right lower extremity without foot 08/03/2017  . Type 2 diabetes mellitus with complication, without long-term current use of insulin ([place]) 08/03/2017  . PNA (pneumonia) 05/07/2017  . SIRS (systemic inflammatory  response syndrome) (HCC)   . Acute respiratory failure with hypoxia ([place]) 03/13/2017  . 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 Rhinovirus infection 08/17/2016  . Pressure injury of skin 08/16/2016  . Acute respiratory distress 08/15/2016  . Tachycardia 08/15/2016  . Sepsis ([place]) 08/15/2016  . Onychomycosis 02/20/2016  . Essential hypertension   . OSA (obstructive sleep apnea) 12/11/2015  . Chronic diastolic (congestive) heart failure ([place])   . Chronic venous insufficiency   . Depression with anxiety 09/20/2015  . CAP (community acquired pneumonia) 09/20/2015  . Hypersomnia 07/16/2015  . Asthma exacerbation 07/16/2015  . GERD (gastroesophageal reflux disease) 07/16/2015  . Snoring 07/16/2015  . Hematemesis with nausea   . Fever 05/24/2014  . Hyperlipidemia 02/22/2012  . Dysphagia 09/16/2010  . 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 BOTOX INJECTION N/A 12/21/2012   Procedure: BOTOX INJECTION;  Surgeon: Inda Castle, MD;  Location: [facility];  Service: Endoscopy;  Laterality: N/A;  . BOTOX INJECTION N/A 06/28/2014   Procedure: BOTOX INJECTION;  Surgeon: Inda Castle, MD;  Location: [facility];  Service: Endoscopy;  Laterality: N/A;  . ESOPHAGOGASTRODUODENOSCOPY N/A 12/21/2012   Procedure: ESOPHAGOGASTRODUODENOSCOPY (EGD);  Surgeon: Inda Castle, MD;  Location: [facility];  Service: Endoscopy;  Laterality: N/A;  . ESOPHAGOGASTRODUODENOSCOPY N/A 06/28/2014   Procedure: ESOPHAGOGASTRODUODENOSCOPY (EGD);  Surgeon: Inda Castle, MD;  Location: [facility];  Service: Endoscopy;  Laterality: N/A;  .  ESOPHAGOGASTRODUODENOSCOPY (EGD) WITH PROPOFOL N/A 09/03/2017   Procedure: ESOPHAGOGASTRODUODENOSCOPY (EGD) WITH PROPOFOL;  Surgeon: Doran Stabler, MD;  Location: [facility];  Service: Gastroenterology;  Laterality: N/A;  . ESOPHAGUS SURGERY     stretched esophagus  . legs    . MOUTH SURGERY    . TENDON  RELEASE         Family History  Problem Relation Age of Onset  . Hypertension Father   . Lung cancer Father   . Diabetes Mother     Social History   Tobacco Use  . Smoking status: Never Smoker  . Smokeless tobacco: Former Systems developer  . Tobacco comment: smoked one time  Substance Use Topics  . Alcohol use: Yes    Comment: 2 beers every other Friday and Saturday  . Drug use: No    Home Medications Prior to Admission medications   Medication Sig Start Date End Date Taking? Authorizing Provider  amLODipine (NORVASC) 10 MG tablet Take 10 mg by mouth daily.    [provider]  aspirin 81 MG chewable tablet Chew by mouth daily.    [provider]  baclofen (LIORESAL) 10 MG tablet Take 5 mg by mouth at bedtime.  06/01/18   [provider]  budesonide-formoterol (SYMBICORT) 80-4.5 MCG/ACT inhaler Inhale 2 puffs into the lungs 2 (two) times daily.    [provider]  [facility] CLOTRIMAZOLE 1 % cream Apply 1 application topically 2 (two) times daily as needed (rash). BETWEEN TOES 08/19/17   [provider]  empagliflozin (JARDIANCE) 10 MG TABS tablet Take 10 mg by mouth daily.    [provider]  furosemide (LASIX) 20 MG tablet Take 20 mg by mouth daily. 07/15/17   [provider]  latanoprost (XALATAN) 0.005 % ophthalmic solution Place 1 drop into both eyes daily as needed (dry eyes).     [provider]  metFORMIN (GLUCOPHAGE-XR) 500 MG 24 hr tablet Take 250 mg by mouth at bedtime.  02/14/19   [provider]  montelukast (SINGULAIR) 10 MG tablet Take 10 mg by mouth daily.  08/12/16   [provider]  mupirocin ointment (BACTROBAN) 2 % Apply 1 application topically daily as needed (rash).  04/30/14   [provider]  naproxen sodium (ALEVE) 220 MG tablet Take 220-440 mg by mouth 2 (two) times daily as needed (for pain).    [provider]  nitroGLYCERIN (NITROSTAT) 0.4 MG SL tablet Place 0.4 mg under  the tongue every 5 (five) minutes as needed for chest pain.     [provider]  omeprazole (PRILOSEC) 20 MG capsule Take 20 mg by mouth daily.    [provider]  ondansetron (ZOFRAN) 4 MG tablet Take 1 tablet (4 mg total) by mouth every 6 (six) hours. Patient taking differently: Take 4 mg by mouth every 6 (six) hours as needed for nausea or vomiting.  02/05/17   Orpah Greek, MD  potassium chloride (K-DUR) 10 MEQ tablet Take 10 mEq by mouth daily.    [provider]  rosuvastatin (CRESTOR) 5 MG tablet Take 5 mg by mouth at bedtime.    [provider]  valsartan (DIOVAN) 80 MG tablet Take 80 mg by mouth daily.    [provider]    Allergies    Penicillins, Sulfamethoxazole-trimethoprim, and Metronidazole  Review of Systems   Review of Systems  Constitutional: Negative for chills and fever.  Respiratory: Negative for cough and shortness of breath.   Cardiovascular: Negative  for chest pain.  Gastrointestinal: Positive for nausea and vomiting. Negative for abdominal pain, anal bleeding, blood in stool, constipation and diarrhea.  Genitourinary: Negative for dysuria, flank pain, frequency, hematuria, scrotal swelling and testicular pain.  All other systems reviewed and are negative.   Physical Exam Updated Vital Signs BP 107/74 (BP Location: Left Arm)   Pulse 89   Temp (!) 97.5 F (36.4 C) (Oral)   Resp 20   SpO2 96%   Physical Exam Vitals and nursing note reviewed.  Constitutional:      General: He is not in acute distress.    Appearance: He is well-developed. He is not toxic-appearing.  HENT:     Head: Normocephalic and atraumatic.  Eyes:     General:        Right eye: No discharge.        Left eye: No discharge.     Conjunctiva/sclera: Conjunctivae normal.  Cardiovascular:     Rate and Rhythm: Normal rate and regular rhythm.  Pulmonary:     Effort: Pulmonary effort is normal. No respiratory distress.     Breath  sounds: Normal breath sounds. No wheezing, rhonchi or rales.  Abdominal:     General: There is no distension.     Palpations: Abdomen is soft.     Tenderness: There is no abdominal tenderness. There is no right CVA tenderness, left CVA tenderness, guarding or rebound.  Musculoskeletal:     Cervical back: Neck supple.     Comments: contractured extremities.   Skin:    General: Skin is warm and dry.     Findings: No rash.  Neurological:     Mental Status: He is alert.     Comments: Clear speech.   Psychiatric:        Behavior: Behavior normal.    ED Results / Procedures / Treatments   Labs (all labs ordered are listed, but only abnormal results are displayed) Labs Reviewed  CBC WITH DIFFERENTIAL/PLATELET - Abnormal; Notable for the following components:      Result Value   WBC 11.8 (*)    MCH 25.3 (*)    Neutro Abs 10.0 (*)    Abs Immature Granulocytes 0.15 (*)    All other components within normal limits  COMPREHENSIVE METABOLIC PANEL - Abnormal; Notable for the following components:   CO2 20 (*)    Glucose, Bld 220 (*)    Calcium 8.8 (*)    All other components within normal limits  LIPASE, BLOOD    EKG None  Radiology No results found.  Procedures Procedures (including critical care time)  Medications Ordered in ED Medications  ondansetron (ZOFRAN) injection 4 mg (4 mg Intravenous Given 03/27/19 2137)  sodium chloride 0.9 % bolus 500 mL (500 mLs Intravenous New Bag/Given 03/27/19 2136)    ED Course  I have reviewed the triage vital signs and the nursing notes.  Pertinent labs & imaging results that were available during my care of the patient were reviewed by me and considered in my medical decision making (see chart for details).    JAZMINE ZECH was evaluated in Emergency Department on 03/27/2019 for the symptoms described in the history of present illness. He/she was evaluated in the context of the global COVID-19 pandemic, which necessitated consideration  that the patient might be at risk for infection with the SARS-CoV-2 virus that causes COVID-19. Institutional protocols and algorithms that pertain to the evaluation of patients at risk for COVID-19 are in a state of rapid  change based on information released by regulatory bodies including the CDC and federal and state organizations. These policies and algorithms were followed during the patient's care in the ED.  MDM Rules/Calculators/A&P                      Patient presents to the ED with complaints of N/V after dinner tonight. Patient nontoxic appearing, in no apparent distress, vitals without significant abnormality. On exam patient's abdomen is completely nontender, no peritoneal signs. Will evaluate with labs. Zofran & 500 cc fluids ordered.   ER work-up reviewed:  CBC: Mild leukocytosis felt to be non specific.  CMP: Mild hypocalcemia. Hyperglycemia without anion gap elevation. LFTs & renal function WNL Lipase: WNL  On repeat abdominal exam patient remains without peritoneal signs, doubt cholecystitis, pancreatitis, diverticulitis, appendicitis, or bowel obstruction/perf. Patient urinates on his own, he denies dysuria/urgency/frequency- doubt UTI. He was recently admitted for covid, saturating well on RA without signs of respiratory distress. Patient tolerating PO in the emergency department, feels ready to go home. Will discharge home with supportive measures. I discussed results, treatment plan, need for PCP follow-up, and return precautions with the patient. Provided opportunity for questions, patient confirmed understanding and is in agreement with plan.   Findings and plan of care discussed with supervising physician Dr. Tomi Bamberger who is in agreement.   Final Clinical Impression(s) / ED Diagnoses Final diagnoses:  Non-intractable vomiting with nausea, unspecified vomiting type    Rx / DC Orders ED Discharge Orders         Ordered    ondansetron (ZOFRAN ODT) 4 MG disintegrating tablet   Every 8 hours PRN     03/27/19 2300           Jumana Paccione, Glynda Jaeger, PA-C 03/27/19 2302    Dorie Rank, MD 03/28/19 1206

## 2019-03-27 NOTE — ED Notes (Signed)
Called [facility] for transport home--Trevaris Pennella 

## 2019-03-28 DIAGNOSIS — R112 Nausea with vomiting, unspecified: Secondary | ICD-10-CM | POA: Diagnosis not present

## 2019-04-12 ENCOUNTER — Other Ambulatory Visit: Payer: Self-pay

## 2019-05-24 ENCOUNTER — Ambulatory Visit: Payer: Medicare Other | Admitting: Podiatry

## 2019-06-07 ENCOUNTER — Other Ambulatory Visit: Payer: Self-pay

## 2019-06-07 ENCOUNTER — Encounter: Payer: Self-pay | Admitting: Podiatry

## 2019-06-07 ENCOUNTER — Ambulatory Visit (INDEPENDENT_AMBULATORY_CARE_PROVIDER_SITE_OTHER): Payer: Medicare Other | Admitting: Podiatry

## 2019-06-07 VITALS — Temp 96.5°F

## 2019-06-07 DIAGNOSIS — M79674 Pain in right toe(s): Secondary | ICD-10-CM

## 2019-06-07 DIAGNOSIS — E118 Type 2 diabetes mellitus with unspecified complications: Secondary | ICD-10-CM

## 2019-06-07 DIAGNOSIS — M79675 Pain in left toe(s): Secondary | ICD-10-CM | POA: Diagnosis not present

## 2019-06-07 DIAGNOSIS — B351 Tinea unguium: Secondary | ICD-10-CM | POA: Diagnosis not present

## 2019-06-07 NOTE — Patient Instructions (Addendum)
Please speak to your PCP about compression hose for chronic swelling. You will need to be measured for your compression hose.   Edema  Edema is an abnormal buildup of fluids in the body tissues and under the skin. Swelling of the legs, feet, and ankles is a common symptom that becomes more likely as you get older. Swelling is also common in looser tissues, like around the eyes. When the affected area is squeezed, the fluid may move out of that spot and leave a dent for a few moments. This dent is called pitting edema. There are many possible causes of edema. Eating too much salt (sodium) and being on your feet or sitting for a long time can cause edema in your legs, feet, and ankles. Hot weather may make edema worse. Common causes of edema include:  Heart failure.  Liver or kidney disease.  Weak leg blood vessels.  Cancer.  An injury.  Pregnancy.  Medicines.  Being obese.  Low protein levels in the blood. Edema is usually painless. Your skin may look swollen or shiny. Follow these instructions at home:  Keep the affected body part raised (elevated) above the level of your heart when you are sitting or lying down.  Do not sit still or stand for long periods of time.  Do not wear tight clothing. Do not wear garters on your upper legs.  Exercise your legs to get your circulation going. This helps to move the fluid back into your blood vessels, and it may help the swelling go down.  Wear elastic bandages or support stockings to reduce swelling as told by your health care provider.  Eat a low-salt (low-sodium) diet to reduce fluid as told by your health care provider.  Depending on the cause of your swelling, you may need to limit how much fluid you drink (fluid restriction).  Take over-the-counter and prescription medicines only as told by your health care provider. Contact a health care provider if:  Your edema does not get better with treatment.  You have heart, liver, or  kidney disease and have symptoms of edema.  You have sudden and unexplained weight gain. Get help right away if:  You develop shortness of breath or chest pain.  You cannot breathe when you lie down.  You develop pain, redness, or warmth in the swollen areas.  You have heart, liver, or kidney disease and suddenly get edema.  You have a fever and your symptoms suddenly get worse. Summary  Edema is an abnormal buildup of fluids in the body tissues and under the skin.  Eating too much salt (sodium) and being on your feet or sitting for a long time can cause edema in your legs, feet, and ankles.  Keep the affected body part raised (elevated) above the level of your heart when you are sitting or lying down. This information is not intended to replace advice given to you by your health care provider. Make sure you discuss any questions you have with your health care provider. Document Revised: 07/20/2018 Document Reviewed: 04/04/2016 Elsevier Patient Education  [place].   Diabetes Mellitus and [place] care is an important part of your health, especially when you have diabetes. Diabetes may cause you to have problems because of poor blood flow (circulation) to your feet and legs, which can cause your skin to:  Become thinner and drier.  Break more easily.  Heal more slowly.  Peel and crack. You may also have nerve damage (neuropathy) in  your legs and feet, causing decreased feeling in them. This means that you may not notice minor injuries to your feet that could lead to more serious problems. Noticing and addressing any potential problems early is the best way to prevent future foot problems. How to care for your feet Foot hygiene  Wash your feet daily with warm water and mild soap. Do not use hot water. Then, pat your feet and the areas between your toes until they are completely dry. Do not soak your feet as this can dry your skin.  Trim your toenails straight  across. Do not dig under them or around the cuticle. File the edges of your nails with an emery board or nail file.  Apply a moisturizing lotion or petroleum jelly to the skin on your feet and to dry, brittle toenails. Use lotion that does not contain alcohol and is unscented. Do not apply lotion between your toes. Shoes and socks  Wear clean socks or stockings every day. Make sure they are not too tight. Do not wear knee-high stockings since they may decrease blood flow to your legs.  Wear shoes that fit properly and have enough cushioning. Always look in your shoes before you put them on to be sure there are no objects inside.  To break in new shoes, wear them for just a few hours a day. This prevents injuries on your feet. Wounds, scrapes, corns, and calluses  Check your feet daily for blisters, cuts, bruises, sores, and redness. If you cannot see the bottom of your feet, use a mirror or ask someone for help.  Do not cut corns or calluses or try to remove them with medicine.  If you find a minor scrape, cut, or break in the skin on your feet, keep it and the skin around it clean and dry. You may clean these areas with mild soap and water. Do not clean the area with peroxide, alcohol, or iodine.  If you have a wound, scrape, corn, or callus on your foot, look at it several times a day to make sure it is healing and not infected. Check for: ? Redness, swelling, or pain. ? Fluid or blood. ? Warmth. ? Pus or a bad smell. General instructions  Do not cross your legs. This may decrease blood flow to your feet.  Do not use heating pads or hot water bottles on your feet. They may burn your skin. If you have lost feeling in your feet or legs, you may not know this is happening until it is too late.  Protect your feet from hot and cold by wearing shoes, such as at the beach or on hot pavement.  Schedule a complete foot exam at least once a year (annually) or more often if you have foot  problems. If you have foot problems, report any cuts, sores, or bruises to your health care provider immediately. Contact a health care provider if:  You have a medical condition that increases your risk of infection and you have any cuts, sores, or bruises on your feet.  You have an injury that is not healing.  You have redness on your legs or feet.  You feel burning or tingling in your legs or feet.  You have pain or cramps in your legs and feet.  Your legs or feet are numb.  Your feet always feel cold.  You have pain around a toenail. Get help right away if:  You have a wound, scrape, corn, or callus on  your foot and: ? You have pain, swelling, or redness that gets worse. ? You have fluid or blood coming from the wound, scrape, corn, or callus. ? Your wound, scrape, corn, or callus feels warm to the touch. ? You have pus or a bad smell coming from the wound, scrape, corn, or callus. ? You have a fever. ? You have a red line going up your leg. Summary  Check your feet every day for cuts, sores, red spots, swelling, and blisters.  Moisturize feet and legs daily.  Wear shoes that fit properly and have enough cushioning.  If you have foot problems, report any cuts, sores, or bruises to your health care provider immediately.  Schedule a complete foot exam at least once a year (annually) or more often if you have foot problems. This information is not intended to replace advice given to you by your health care provider. Make sure you discuss any questions you have with your health care provider. Document Revised: 11/23/2018 Document Reviewed: 04/03/2016 Elsevier Patient Education  [place].

## 2019-06-12 NOTE — Progress Notes (Signed)
Subjective: Thomas Ramos presents today for follow up of preventative diabetic foot care and painful mycotic nails b/l that are difficult to trim. Pain interferes with ambulation. Aggravating factors include wearing enclosed shoe gear. Pain is relieved with periodic professional debridement.   He states he has gotten a new motorized chair which has padding and no longer places pressure on his legs. He is happy with his new chair.  Allergies  Allergen Reactions  . Penicillins Hives, Nausea And Vomiting and Other (See Comments)    Patient tolerated cefazolin in 2017 Has patient had a PCN reaction causing immediate rash, facial/tongue/throat swelling, SOB or lightheadedness with hypotension: Yes Has patient had a PCN reaction causing severe rash involving mucus membranes or skin necrosis: No Has patient had a PCN reaction that required hospitalization No Has patient had a PCN reaction occurring within the last 10 years: Yes If all of the above answers are "NO", then may proceed with Cephalosporin use.  . Sulfamethoxazole-Trimethoprim Nausea And Vomiting  . Metronidazole Nausea And Vomiting     Objective: Vitals:   06/07/19 1313  Temp: (!) 96.5 F (35.8 C)    Pt 49 y.o. year old male  in NAD. AAO x 3.   Vascular Examination:  Capillary fill time to digits <3 seconds b/l. Palpable DP pulses b/l. Palpable PT pulses b/l. Pedal hair absent b/l Skin temperature gradient within normal limits b/l. Nonpitting edema noted b/l LE.  Dermatological Examination: Pedal skin with normal turgor, texture and tone bilaterally. No open wounds bilaterally. No interdigital macerations bilaterally. Toenails 1-5 b/l elongated, dystrophic, thickened, crumbly with subungual debris and tenderness to dorsal palpation.  Musculoskeletal: Flaccid lower extremities noted b/l, no gross bony deformities bilaterally, no pain crepitus or joint limitation noted with ROM b/l and utilizes motorized chair for mobility  assistance  Neurological: Protective sensation intact 5/5 intact bilaterally with 10g monofilament b/l  Assessment: 1. Pain due to onychomycosis of toenails of both feet   2. Type 2 diabetes mellitus with complication, without long-term current use of insulin ([place])    Plan: -Continue diabetic foot care principles. Literature dispensed on today.  -Toenails 1-5 b/l were debrided in length and girth with sterile nail nippers and dremel without iatrogenic bleeding.  -Patient to continue soft, supportive shoe gear daily. -Patient to report any pedal injuries to medical professional immediately. -Patient/POA to call should there be question/concern in the interim.  Return in about 3 months (around 09/07/2019).

## 2019-08-23 ENCOUNTER — Telehealth: Payer: Self-pay | Admitting: Pulmonary Disease

## 2019-09-06 ENCOUNTER — Encounter: Payer: Self-pay | Admitting: Gastroenterology

## 2019-09-06 ENCOUNTER — Ambulatory Visit (INDEPENDENT_AMBULATORY_CARE_PROVIDER_SITE_OTHER): Payer: Medicare Other | Admitting: Gastroenterology

## 2019-09-06 VITALS — BP 108/82 | HR 100

## 2019-09-06 DIAGNOSIS — R1310 Dysphagia, unspecified: Secondary | ICD-10-CM | POA: Diagnosis not present

## 2019-09-06 DIAGNOSIS — R1319 Other dysphagia: Secondary | ICD-10-CM

## 2019-09-06 DIAGNOSIS — R194 Change in bowel habit: Secondary | ICD-10-CM

## 2019-09-06 NOTE — Patient Instructions (Signed)
If you are age 46 or older, your body mass index should be between 23-30. Your There is no height or weight on file to calculate BMI. If this is out of the aforementioned range listed, please consider follow up with your Primary Care Provider.  If you are age 87 or younger, your body mass index should be between 19-25. Your There is no height or weight on file to calculate BMI. If this is out of the aformentioned range listed, please consider follow up with your Primary Care Provider.   It was a pleasure to see you today!  Dr. Loletha Carrow

## 2019-09-06 NOTE — Progress Notes (Signed)
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 Deatra Ina with a Botox injection in 2014.  Upper endoscopy in June 2019 showed a large hiatal hernia with resultant distal esophageal tortuosity, but no stricture or resistance passing the scope through EG J.  It did not appear there was any GJ outflow obstruction, no Botox was performed at that time.  Thomas Ramos had a previous admission for aspiration that was very likely due to that large hiatal hernia.  He has never been interested in having a feeding tube placed.  I last saw Thomas Ramos July 2020 at the request of his primary care provider, Dr. Anastasia Pall, regarding chronic diarrhea in the setting of Metformin use.  Thomas Ramos said that he had been on variable doses of the medicine for years, and had been troubled by chronic diarrhea from it.  Apparently at a relatively low dose it does not cause diarrhea.  When the medicine was recently discontinued before that visit, the diarrhea had stopped altogether.  I saw Thomas Ramos today at the request of his caregiver, who came to the latter part of the appointment and was part of discussion today.  History was somewhat difficult to obtain initially, and then I determined Thomas Ramos was taking Imodium daily even though he was not always having diarrhea.  His Metformin dose was decreased after the visit with me last year.  He transition from his own apartment to a group home, and his caregiver says there is 24/7 staff availability to help [facility] use the bathroom when he needs to.  However, his impression is that Thomas Ramos is often reluctant to do that.  Thomas Ramos says that he stopped using the Imodium daily at the staff's suggestion, and now only does so occasionally, such as this past weekend when they went to the water park for the day.  Sometimes he feels bloated, and his caregiver wondered if  this might be from constipation.  ROS: Cardiovascular:  no chest pain Respiratory: no dyspnea Thomas Ramos says he is swallowing food generally well , occasional dysphagia, no vomiting   The patient's Past Medical, Family and Social History were reviewed and are on file in the EMR. Hospital admission January 5-9 of this year for Covid pneumonia, treated with remdesivir and steroids and supplemental oxygen.  The following week he was in the emergency department for nausea and vomiting that occurred after eating steak at dinner. Objective:  Med list reviewed  Current Outpatient Medications:  .  amLODipine (NORVASC) 10 MG tablet, Take 10 mg by mouth daily., Disp: , Rfl:  .  aspirin 81 MG chewable tablet, Chew by mouth daily., Disp: , Rfl:  .  baclofen (LIORESAL) 10 MG tablet, Take 5 mg by mouth at bedtime. , Disp: , Rfl:  .  budesonide-formoterol (SYMBICORT) 80-4.5 MCG/ACT inhaler, Inhale 2 puffs into the lungs 2 (two) times daily., Disp: , Rfl:  .  CVS CLOTRIMAZOLE 1 % cream, Apply 1 application topically 2 (two) times daily as needed (rash). BETWEEN TOES, Disp: , Rfl: 1 .  empagliflozin (JARDIANCE) 10 MG TABS tablet, Take 10 mg by mouth daily., Disp: , Rfl:  .  furosemide (LASIX) 20 MG tablet, Take 20 mg by mouth daily., Disp: , Rfl: 1 .  latanoprost (XALATAN) 0.005 % ophthalmic solution, Place 1 drop into both eyes daily as needed (dry eyes). , Disp: , Rfl:  .  metFORMIN (GLUCOPHAGE-XR) 500 MG 24 hr tablet, Take 250 mg by mouth at bedtime. , Disp: , Rfl:  .  montelukast (SINGULAIR) 10 MG tablet, Take 10 mg by mouth daily. , Disp: , Rfl:  .  mupirocin ointment (BACTROBAN) 2 %, Apply 1 application topically daily as needed (rash). , Disp: , Rfl:  .  naproxen sodium (ALEVE) 220 MG tablet, Take 220-440 mg by mouth 2 (two) times daily as needed (for pain)., Disp: , Rfl:  .  nitroGLYCERIN (NITROSTAT) 0.4 MG SL tablet, Place 0.4 mg under the tongue every 5 (five) minutes as needed for chest pain. , Disp:  , Rfl:  .  ondansetron (ZOFRAN ODT) 4 MG disintegrating tablet, Take 1 tablet (4 mg total) by mouth every 8 (eight) hours as needed for nausea or vomiting., Disp: 5 tablet, Rfl: 0 .  potassium chloride (K-DUR) 10 MEQ tablet, Take 10 mEq by mouth daily., Disp: , Rfl:  .  rosuvastatin (CRESTOR) 5 MG tablet, Take 5 mg by mouth at bedtime., Disp: , Rfl:  .  valsartan (DIOVAN) 80 MG tablet, Take 80 mg by mouth daily., Disp: , Rfl:    Vital signs in last 24 hrs: Vitals:   09/06/19 1326  BP: 108/82  Pulse: 100    Physical Exam  Dysarthric, alert and conversational.  Wheelchair-bound, spasticity  HEENT: sclera anicteric, oral mucosa moist without lesions  Neck: supple, no thyromegaly, JVD or lymphadenopathy  Cardiac: RRR without murmurs, S1S2 heard, no peripheral edema  Pulm: clear to auscultation bilaterally, normal RR and effort noted  Abdomen: soft, no tenderness, with active bowel sounds.  Limited exam in wheelchair  Skin; warm and dry, no jaundice or rash  Labs:   ___________________________________________ Radiologic studies:   ____________________________________________ Other:   _____________________________________________ Assessment & Plan  Assessment: Encounter Diagnoses  Name Primary?  . Change in bowel habits Yes  . Esophageal dysphagia    It is not clear if Thomas Ramos was truly having ongoing diarrhea, or had just gotten in the habit of taking the Imodium daily.  I agree that it seems to have been causing some constipation and resultant bloating.  I agree with using it just as needed such as the group excursion this past weekend.   Plan: No other testing or treatments planned today.   30 minutes were spent on this encounter (including chart review, history/exam, counseling/coordination of care, and documentation)  Thomas Ramos

## 2019-09-08 ENCOUNTER — Telehealth: Payer: Self-pay | Admitting: Gastroenterology

## 2019-09-08 ENCOUNTER — Encounter: Payer: Self-pay | Admitting: Podiatry

## 2019-09-08 ENCOUNTER — Ambulatory Visit (INDEPENDENT_AMBULATORY_CARE_PROVIDER_SITE_OTHER): Payer: Medicare Other | Admitting: Podiatry

## 2019-09-08 ENCOUNTER — Other Ambulatory Visit: Payer: Self-pay

## 2019-09-08 VITALS — Temp 96.4°F

## 2019-09-08 DIAGNOSIS — M79675 Pain in left toe(s): Secondary | ICD-10-CM

## 2019-09-08 DIAGNOSIS — B351 Tinea unguium: Secondary | ICD-10-CM

## 2019-09-08 DIAGNOSIS — M79674 Pain in right toe(s): Secondary | ICD-10-CM | POA: Diagnosis not present

## 2019-09-08 DIAGNOSIS — E118 Type 2 diabetes mellitus with unspecified complications: Secondary | ICD-10-CM | POA: Diagnosis not present

## 2019-09-08 NOTE — Patient Instructions (Signed)
Diabetes Mellitus and Foot Care Foot care is an important part of your health, especially when you have diabetes. Diabetes may cause you to have problems because of poor blood flow (circulation) to your feet and legs, which can cause your skin to:  Become thinner and drier.  Break more easily.  Heal more slowly.  Peel and crack. You may also have nerve damage (neuropathy) in your legs and feet, causing decreased feeling in them. This means that you may not notice minor injuries to your feet that could lead to more serious problems. Noticing and addressing any potential problems early is the best way to prevent future foot problems. How to care for your feet Foot hygiene  Wash your feet daily with warm water and mild soap. Do not use hot water. Then, pat your feet and the areas between your toes until they are completely dry. Do not soak your feet as this can dry your skin.  Trim your toenails straight across. Do not dig under them or around the cuticle. File the edges of your nails with an emery board or nail file.  Apply a moisturizing lotion or petroleum jelly to the skin on your feet and to dry, brittle toenails. Use lotion that does not contain alcohol and is unscented. Do not apply lotion between your toes. Shoes and socks  Wear clean socks or stockings every day. Make sure they are not too tight. Do not wear knee-high stockings since they may decrease blood flow to your legs.  Wear shoes that fit properly and have enough cushioning. Always look in your shoes before you put them on to be sure there are no objects inside.  To break in new shoes, wear them for just a few hours a day. This prevents injuries on your feet. Wounds, scrapes, corns, and calluses  Check your feet daily for blisters, cuts, bruises, sores, and redness. If you cannot see the bottom of your feet, use a mirror or ask someone for help.  Do not cut corns or calluses or try to remove them with medicine.  If you  find a minor scrape, cut, or break in the skin on your feet, keep it and the skin around it clean and dry. You may clean these areas with mild soap and water. Do not clean the area with peroxide, alcohol, or iodine.  If you have a wound, scrape, corn, or callus on your foot, look at it several times a day to make sure it is healing and not infected. Check for: ? Redness, swelling, or pain. ? Fluid or blood. ? Warmth. ? Pus or a bad smell. General instructions  Do not cross your legs. This may decrease blood flow to your feet.  Do not use heating pads or hot water bottles on your feet. They may burn your skin. If you have lost feeling in your feet or legs, you may not know this is happening until it is too late.  Protect your feet from hot and cold by wearing shoes, such as at the beach or on hot pavement.  Schedule a complete foot exam at least once a year (annually) or more often if you have foot problems. If you have foot problems, report any cuts, sores, or bruises to your health care provider immediately. Contact a health care provider if:  You have a medical condition that increases your risk of infection and you have any cuts, sores, or bruises on your feet.  You have an injury that is not   healing.  You have redness on your legs or feet.  You feel burning or tingling in your legs or feet.  You have pain or cramps in your legs and feet.  Your legs or feet are numb.  Your feet always feel cold.  You have pain around a toenail. Get help right away if:  You have a wound, scrape, corn, or callus on your foot and: ? You have pain, swelling, or redness that gets worse. ? You have fluid or blood coming from the wound, scrape, corn, or callus. ? Your wound, scrape, corn, or callus feels warm to the touch. ? You have pus or a bad smell coming from the wound, scrape, corn, or callus. ? You have a fever. ? You have a red line going up your leg. Summary  Check your feet every day  for cuts, sores, red spots, swelling, and blisters.  Moisturize feet and legs daily.  Wear shoes that fit properly and have enough cushioning.  If you have foot problems, report any cuts, sores, or bruises to your health care provider immediately.  Schedule a complete foot exam at least once a year (annually) or more often if you have foot problems. This information is not intended to replace advice given to you by your health care provider. Make sure you discuss any questions you have with your health care provider. Document Revised: 11/23/2018 Document Reviewed: 04/03/2016 Elsevier Patient Education  2020 [employer].  

## 2019-09-08 NOTE — Telephone Encounter (Signed)
Do you know anything about paper being faxed for this pt?

## 2019-09-08 NOTE — Telephone Encounter (Signed)
Mr Wires's office visit has been faxed to the group home as requested. [phone]

## 2019-09-14 NOTE — Progress Notes (Signed)
Subjective: Thomas Ramos is a pleasant 49 y.o. male patient seen today preventative diabetic foot care and painful thick toenails that are difficult to trim. Pain interferes with ambulation. Aggravating factors include wearing enclosed shoe gear. Pain is relieved with periodic professional debridement.   He relates he has gotten a new motorized chair. He is accompanied by his group home caretaker. They voice no new pedal problems on today's visit.  Past Medical History:  Diagnosis Date  . Anxiety   . Cellulitis 08/04/2017  . Cerebral palsy ([place])   . Chronic diastolic (congestive) heart failure ([place])   . CP (cerebral palsy) ([place])   . Diabetes mellitus without complication (HCC)    borderline  . Environmental allergies    takes inhalers if needed  . Esophageal stricture   . GERD (gastroesophageal reflux disease)   . Hypertension   . Motility disorder, esophageal   . Pneumonia   . 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 S/P Botox injection    approx every 4 months  . Seasonal allergies     Patient Active Problem List   Diagnosis Date Noted  . Pneumonia due to COVID-19 virus 03/21/2019  . COVID-19 03/21/2019  . Hiatal hernia   . Cellulitis of right lower extremity without foot 08/03/2017  . Type 2 diabetes mellitus with complication, without long-term current use of insulin ([place]) 08/03/2017  . PNA (pneumonia) 05/07/2017  . SIRS (systemic inflammatory response syndrome) (HCC)   . Acute respiratory failure with hypoxia ([place]) 03/13/2017  . 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 Rhinovirus infection 08/17/2016  . Pressure injury of skin 08/16/2016  . Acute respiratory distress 08/15/2016  . Tachycardia 08/15/2016  . Sepsis ([place]) 08/15/2016  . Onychomycosis 02/20/2016  . Essential hypertension   . OSA (obstructive sleep apnea) 12/11/2015  . Chronic diastolic (congestive) heart failure ([place])   . Chronic venous insufficiency   . Depression with anxiety 09/20/2015  . CAP  (community acquired pneumonia) 09/20/2015  . Hypersomnia 07/16/2015  . Asthma exacerbation 07/16/2015  . GERD (gastroesophageal reflux disease) 07/16/2015  . Snoring 07/16/2015  . Hematemesis with nausea   . Fever 05/24/2014  . Hyperlipidemia 02/22/2012  . Dysphagia 09/16/2010  . 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 amLODipine (NORVASC) 10 MG tablet Take 10 mg by mouth daily.    Marland Kitchen aspirin 81 MG chewable tablet Chew by mouth daily.    . baclofen (LIORESAL) 10 MG tablet Take 5 mg by mouth at bedtime.     . budesonide-formoterol (SYMBICORT) 80-4.5 MCG/ACT inhaler Inhale 2 puffs into the lungs 2 (two) times daily.    . CVS CLOTRIMAZOLE 1 % cream Apply 1 application topically 2 (two) times daily as needed (rash). BETWEEN TOES  1  . empagliflozin (JARDIANCE) 10 MG TABS tablet Take 10 mg by mouth daily.    . furosemide (LASIX) 20 MG tablet Take 20 mg by mouth daily.  1  . latanoprost (XALATAN) 0.005 % ophthalmic solution Place 1 drop into both eyes daily as needed (dry eyes).     . metFORMIN (GLUCOPHAGE-XR) 500 MG 24 hr tablet Take 250 mg by mouth at bedtime.     . montelukast (SINGULAIR) 10 MG tablet Take 10 mg by mouth daily.     . mupirocin ointment (BACTROBAN) 2 % Apply 1 application topically daily as needed (rash).     . naproxen sodium (ALEVE) 220 MG tablet Take  220-440 mg by mouth 2 (two) times daily as needed (for pain).    . nitroGLYCERIN (NITROSTAT) 0.4 MG SL tablet Place 0.4 mg under the tongue every 5 (five) minutes as needed for chest pain.     Marland Kitchen ondansetron (ZOFRAN ODT) 4 MG disintegrating tablet Take 1 tablet (4 mg total) by mouth every 8 (eight) hours as needed for nausea or vomiting. 5 tablet 0  . potassium chloride (K-DUR) 10 MEQ tablet Take 10 mEq by mouth daily.    . rosuvastatin (CRESTOR) 5 MG tablet Take 5 mg by mouth at bedtime.    . valsartan (DIOVAN) 80 MG tablet Take 80  mg by mouth daily.     No current facility-administered medications on file prior to visit.    Allergies  Allergen Reactions  . Penicillins Hives, Nausea And Vomiting and Other (See Comments)    Patient tolerated cefazolin in 2017 Has patient had a PCN reaction causing immediate rash, facial/tongue/throat swelling, SOB or lightheadedness with hypotension: Yes Has patient had a PCN reaction causing severe rash involving mucus membranes or skin necrosis: No Has patient had a PCN reaction that required hospitalization No Has patient had a PCN reaction occurring within the last 10 years: Yes If all of the above answers are "NO", then may proceed with Cephalosporin use.  . Sulfamethoxazole-Trimethoprim Nausea And Vomiting  . Metronidazole Nausea And Vomiting    Objective: Physical Exam  General: Thomas Ramos is a pleasant 49 y.o.  male, in NAD. AAO x 3.   Vascular:  Neurovascular status unchanged b/l lower extremities. Capillary fill time to digits <3 seconds b/l lower extremities. Palpable pedal pulses b/l LE. Pedal hair absent. Lower extremity skin temperature gradient within normal limits. No pain with calf compression b/l. Nonpitting edema noted b/l lower extremities.  Dermatological:  Pedal skin with normal turgor, texture and tone bilaterally. No open wounds bilaterally. No interdigital macerations bilaterally. Toenails 1-5 b/l elongated, discolored, dystrophic, thickened, crumbly with subungual debris and tenderness to dorsal palpation.  Musculoskeletal:  Flaccid lower extremities noted b/l. No pain crepitus or joint limitation noted with ROM b/l. No gross bony deformities bilaterally. Utilizes motorized chair for mobility assistance.  Neurological:  Protective sensation intact 5/5 intact bilaterally with 10g monofilament b/l.  Assessment and Plan:  1. Pain due to onychomycosis of toenails of both feet   2. Type 2 diabetes mellitus with complication, without long-term current  use of insulin ([place])    -Examined patient. -No new findings. No new orders. -Continue diabetic foot care principles. -Toenails 1-5 b/l were debrided in length and girth with sterile nail nippers and dremel without iatrogenic bleeding.  -Patient to report any pedal injuries to medical professional immediately. -Patient to continue soft, supportive shoe gear daily. -Patient/POA to call should there be question/concern in the interim.  Return in about 3 months (around 12/09/2019).  Marzetta Board, DPM

## 2019-10-04 ENCOUNTER — Encounter: Payer: Self-pay | Admitting: Pulmonary Disease

## 2019-10-04 ENCOUNTER — Ambulatory Visit (INDEPENDENT_AMBULATORY_CARE_PROVIDER_SITE_OTHER): Payer: Medicare Other | Admitting: Pulmonary Disease

## 2019-10-04 ENCOUNTER — Other Ambulatory Visit: Payer: Self-pay

## 2019-10-04 VITALS — BP 110/82 | HR 85 | Temp 98.4°F

## 2019-10-04 DIAGNOSIS — G4733 Obstructive sleep apnea (adult) (pediatric): Secondary | ICD-10-CM

## 2019-10-04 NOTE — Progress Notes (Signed)
[facility]  Chief Complaint  Patient presents with  . Follow-up    Constitutional:  BP 110/82 (BP Location: Right Arm, Cuff Size: Normal)   Pulse 85   Temp 98.4 F (36.9 C) (Oral)   SpO2 100% Comment: RA  Past Medical History:  Anxiety, Cerebral palsy, Diastolic CHF, DM, Allergies, Esophageal stricture, HTN, Pneumonia, Quadriplegia  Summary:  Thomas Ramos is a 49 y.o. male with obstructive sleep apnea.  Subjective:  Last seen in 2019.  He is here with his caregiver.  He has not used CPAP for a while and no longer has a machine.  Reportedly has snoring and is sleepy during the day.     Physical Exam:   Appearance - in wheel chair  ENMT - no sinus tenderness, no oral exudate, no LAN, Mallampati 4 airway, no stridor, high arched palate  Respiratory - equal breath sounds bilaterally, no wheezing or rales  CV - s1s2 regular rate and rhythm, no murmurs  Ext - no clubbing, no edema  Skin - no rashes  Psych - normal mood and affect   Assessment/Plan:   Obstructive sleep apnea. - will arrange for home sleep study to further assess current status and then arrange for new CPAP set up   A total of  21 minutes spent addressing patient care issues on day of visit.  Follow up:  Patient Instructions  Will arrange for home sleep study Will call to arrange for follow up after sleep study reviewed    Signature:  Chesley Mires, MD [place] Pager: [phone] 10/04/2019, 10:45 AM  Flow Sheet     Pulmonary tests:   CT angio chest 08/15/16 >> large hiatal hernia  Sleep tests:   PSG 11/08/15 >> AHI 28.4, SpO2 low 61%  Auto CPAP 02/03/17 to 05/03/17 >> used on 90 of 90 nights with average 7 hrs 13 min.  Average AHI 1.7 with median CPAP 8 and 95 th percentile CPAP 12 cm H2O  Cardiac tests:   Echo 03/15/17 >> EF 65 to 70%  Medications:   Allergies as of 10/04/2019      Reactions   Penicillins  Hives, Nausea And Vomiting, Other (See Comments)   Patient tolerated cefazolin in 2017 Has patient had a PCN reaction causing immediate rash, facial/tongue/throat swelling, SOB or lightheadedness with hypotension: Yes Has patient had a PCN reaction causing severe rash involving mucus membranes or skin necrosis: No Has patient had a PCN reaction that required hospitalization No Has patient had a PCN reaction occurring within the last 10 years: Yes If all of the above answers are "NO", then may proceed with Cephalosporin use.   Sulfamethoxazole-trimethoprim Nausea And Vomiting   Metronidazole Nausea And Vomiting      Medication List       Accurate as of October 04, 2019 10:45 AM. If you have any questions, ask your nurse or doctor.        amLODipine 10 MG tablet Commonly known as: NORVASC Take 10 mg by mouth daily.   aspirin 81 MG chewable tablet Chew by mouth daily.   baclofen 10 MG tablet Commonly known as: LIORESAL Take 5 mg by mouth at bedtime.   budesonide-formoterol 80-4.5 MCG/ACT inhaler Commonly known as: SYMBICORT Inhale 2 puffs into the lungs 2 (two) times daily.   [employer]S Clotrimazole 1 % cream Generic drug: clotrimazole Apply 1 application topically 2 (two) times daily as needed (rash). BETWEEN TOES   furosemide 20 MG tablet Commonly  known as: LASIX Take 20 mg by mouth daily.   Jardiance 10 MG Tabs tablet Generic drug: empagliflozin Take 10 mg by mouth daily.   latanoprost 0.005 % ophthalmic solution Commonly known as: XALATAN Place 1 drop into both eyes daily as needed (dry eyes).   metFORMIN 500 MG 24 hr tablet Commonly known as: GLUCOPHAGE-XR Take 250 mg by mouth at bedtime.   montelukast 10 MG tablet Commonly known as: SINGULAIR Take 10 mg by mouth daily.   mupirocin ointment 2 % Commonly known as: BACTROBAN Apply 1 application topically daily as needed (rash).   naproxen sodium 220 MG tablet Commonly known as: ALEVE Take 220-440 mg by mouth 2  (two) times daily as needed (for pain).   nitroGLYCERIN 0.4 MG SL tablet Commonly known as: NITROSTAT Place 0.4 mg under the tongue every 5 (five) minutes as needed for chest pain.   ondansetron 4 MG disintegrating tablet Commonly known as: Zofran ODT Take 1 tablet (4 mg total) by mouth every 8 (eight) hours as needed for nausea or vomiting.   potassium chloride 10 MEQ tablet Commonly known as: KLOR-CON Take 10 mEq by mouth daily.   rosuvastatin 5 MG tablet Commonly known as: CRESTOR Take 5 mg by mouth at bedtime.   valsartan 80 MG tablet Commonly known as: DIOVAN Take 80 mg by mouth daily.       Past Surgical History:  He  has a past surgical history that includes Tendon release; Mouth surgery; Esophagus surgery; legs; Esophagogastroduodenoscopy (N/A, 12/21/2012); Botox injection (N/A, 12/21/2012); Esophagogastroduodenoscopy (N/A, 06/28/2014); Botox injection (N/A, 06/28/2014); and Esophagogastroduodenoscopy (egd) with propofol (N/A, 09/03/2017).  Family History:  His family history includes Diabetes in his mother; Hypertension in his father; Lung cancer in his father.  Social History:  He  reports that he has never smoked. He has quit using smokeless tobacco. He reports current alcohol use. He reports that he does not use drugs.

## 2019-10-04 NOTE — Patient Instructions (Signed)
Will arrange for home sleep study Will call to arrange for follow up after sleep study reviewed  

## 2019-10-18 ENCOUNTER — Telehealth: Payer: Self-pay | Admitting: Pulmonary Disease

## 2019-10-18 NOTE — Telephone Encounter (Signed)
Thomas Ramos this one is yours it looks like

## 2019-10-18 NOTE — Telephone Encounter (Signed)
Did the [facility] team try to call this patient to schedule a home sleep study? He is calling back. Please advise if triage can assist you.

## 2019-10-19 NOTE — Telephone Encounter (Signed)
LVM again today asking the patient to call back in to schedule picking up the HST machine

## 2019-10-19 NOTE — Telephone Encounter (Signed)
I have spoken with Thomas Ramos and he is scheduled to pick up the HST machine on 10/23/2019 @ 3:30pm

## 2019-10-23 ENCOUNTER — Ambulatory Visit: Payer: Medicare Other

## 2019-10-23 ENCOUNTER — Other Ambulatory Visit: Payer: Self-pay

## 2019-10-23 DIAGNOSIS — G4733 Obstructive sleep apnea (adult) (pediatric): Secondary | ICD-10-CM

## 2019-10-26 ENCOUNTER — Telehealth: Payer: Self-pay | Admitting: Pulmonary Disease

## 2019-10-26 DIAGNOSIS — G4733 Obstructive sleep apnea (adult) (pediatric): Secondary | ICD-10-CM

## 2019-10-26 NOTE — Telephone Encounter (Signed)
Patient is returning phone call. Patient phone number is [phone].

## 2019-10-26 NOTE — Telephone Encounter (Signed)
Patient calling for results of sleep study, please advise.

## 2019-10-26 NOTE — Telephone Encounter (Signed)
Pt returned call. Stated to pt that we were checking with VS in regards to the results of his HST and he verbalized understanding. Will leave encounter open while we wait for VS to review results.

## 2019-10-26 NOTE — Telephone Encounter (Signed)
Attempted to call pt but unable to reach and unable to leave a VM. Will try to call back later. 

## 2019-11-01 DIAGNOSIS — G4733 Obstructive sleep apnea (adult) (pediatric): Secondary | ICD-10-CM | POA: Diagnosis not present

## 2019-11-02 NOTE — Telephone Encounter (Signed)
LMTC x 1  

## 2019-11-02 NOTE — Telephone Encounter (Signed)
Spoke with pt and advised to sleep study results per Dr Halford Chessman. Pt verbalized understanding. Order placed for new cpap start. Nothing further needed.

## 2019-11-02 NOTE — Telephone Encounter (Signed)
HST 10/23/19 >> AHI 36.8, SpO2 low 81%   Please let him know that home sleep study showed severe sleep apnea.  Please send order to arrange for new auto CPAP machine range 5 to 15 cm H2O with heated humidity and mask of choice.    He needs ROV 2 months after getting new auto CPAP machine.

## 2019-11-07 ENCOUNTER — Telehealth: Payer: Self-pay | Admitting: Pulmonary Disease

## 2019-11-07 NOTE — Telephone Encounter (Signed)
Forwarding to [facility] per protocol since order placed 11/02/19

## 2019-11-08 NOTE — Telephone Encounter (Signed)
Order was received by [employer] it usually takes 1-2 wks to get set up they will call pt when Josem Kaufmann has been done Joellen Jersey

## 2019-11-08 NOTE — Telephone Encounter (Signed)
Called and spoke with pt letting him know the info stated by [facility] and he verbalized understanding. Nothing further needed.

## 2019-12-05 ENCOUNTER — Telehealth: Payer: Self-pay | Admitting: Gastroenterology

## 2019-12-05 NOTE — Telephone Encounter (Signed)
Spoke with patient, he states that he had an appt with his PCP who is recommending a screening colonoscopy. Pt would like to discuss this further with Dr. Loletha Carrow. He is scheduled for an appt on 02/06/20 at 3:20 pm.

## 2019-12-15 ENCOUNTER — Ambulatory Visit: Payer: Medicare Other | Admitting: Podiatry

## 2019-12-18 ENCOUNTER — Telehealth: Payer: Self-pay | Admitting: Pulmonary Disease

## 2019-12-18 ENCOUNTER — Telehealth: Payer: Self-pay | Admitting: Gastroenterology

## 2019-12-18 NOTE — Telephone Encounter (Signed)
Spoke with patient he states that he is having frequent bowel movements, pt denies that he has diarrhea. Pt states that he has an appt in November, pt advised that Dr. Loletha Carrow had a cancellation this week, pt denied offer for sooner appt. Pt states that his Metformin has been decreased. Pt advised that certain foods can cause him to have more bowel movements than usual, pt advised to try a probiotic and to take Imodium as needed if he develops diarrhea. Pt has no other concerns at this time

## 2019-12-18 NOTE — Telephone Encounter (Signed)
Patient is requesting to speak with a nurse did not provide any other information.

## 2019-12-18 NOTE — Telephone Encounter (Signed)
Spoke to pt he is concerned that [employer] told him ne needed to come to their office to pick up his cpap he is in a wheelchair and would like to see if they can come to his home I will send a message to [employer] to see if they can do this Joellen Jersey

## 2019-12-19 NOTE — Telephone Encounter (Signed)
Received message back from [employer] and pt has been approved for home set up it may cause a delay and pt is aware Joellen Jersey

## 2020-01-01 NOTE — Telephone Encounter (Signed)
Patient requesting to speak only with you

## 2020-01-01 NOTE — Telephone Encounter (Signed)
Spoke with patient, he is requesting that we fax latest office notes from Dr. Loletha Carrow to support that he does not use the bathroom "normally" due to being in a wheel chair. Advised that I will fax the latest office note that we have. Requested that this be faxed to Hardie Lora at [phone]. Records faxed.

## 2020-01-03 NOTE — Telephone Encounter (Signed)
Records faxed to [phone]

## 2020-01-03 NOTE — Telephone Encounter (Signed)
Patient called today, states that they had not received the fax as the printer was out of ink until yesterday. Requested that I refax office note. Last office note re-faxed today.

## 2020-01-03 NOTE — Telephone Encounter (Signed)
Pt would like for the forms be refaxed to a new fax number   FAX:[phone]

## 2020-01-08 ENCOUNTER — Encounter: Payer: Self-pay | Admitting: Pulmonary Disease

## 2020-01-08 ENCOUNTER — Other Ambulatory Visit: Payer: Self-pay

## 2020-01-08 ENCOUNTER — Ambulatory Visit (INDEPENDENT_AMBULATORY_CARE_PROVIDER_SITE_OTHER): Payer: Medicare Other | Admitting: Pulmonary Disease

## 2020-01-08 VITALS — BP 110/82 | HR 101 | Temp 97.9°F | Wt 190.0 lb

## 2020-01-08 DIAGNOSIS — G4733 Obstructive sleep apnea (adult) (pediatric): Secondary | ICD-10-CM | POA: Diagnosis not present

## 2020-01-08 DIAGNOSIS — G809 Cerebral palsy, unspecified: Secondary | ICD-10-CM | POA: Diagnosis not present

## 2020-01-08 NOTE — Assessment & Plan Note (Signed)
Plan: Continue CPAP therapy Mask fitting from DME company Have contacted [employer] [facility] to get patient added to [facility]

## 2020-01-08 NOTE — Patient Instructions (Addendum)
You were seen today by Lauraine Rinne, NP  for:   1. OSA (obstructive sleep apnea)  We have contacted [employer] to get them added into [facility] We will then request a compliance report  After we have reviewed the compliance report we can make a decision on CPAP settings  We will also place an order for [employer] to get you set up with a new mask  We recommend that you continue using your CPAP daily >>>Keep up the hard work using your device >>> Goal should be wearing this for the entire night that you are sleeping, at least 4 to 6 hours  Remember:  . Do not drive or operate heavy machinery if tired or drowsy.  . Please notify the supply company and office if you are unable to use your device regularly due to missing supplies or machine being broken.  . Work on maintaining a healthy weight and following your recommended nutrition plan  . Maintain proper daily exercise and movement  . Maintaining proper use of your device can also help improve management of other chronic illnesses such as: Blood pressure, blood sugars, and weight management.   BiPAP/ CPAP Cleaning:  >>>Clean weekly, with Dawn soap, and bottle brush.  Set up to air dry. >>> Wipe mask out daily with wet wipe or towelette    Follow Up:    Return in about 3 months (around 04/09/2020), or if symptoms worsen or fail to improve, for Follow up with Dr. Halford Chessman.   Notification of test results are managed in the following manner: If there are  any recommendations or changes to the  plan of care discussed in office today,  we will contact you and let you know what they are. If you do not hear from [place], then your results are normal and you can view them through your  MyChart account , or a letter will be sent to you. Thank you again for trusting [place] with your care  - Thank you, [facility]    It is flu season:   >>> Best ways to protect herself from the flu: Receive the yearly flu vaccine, practice good hand hygiene washing  with soap and also using hand sanitizer when available, eat a nutritious meals, get adequate rest, hydrate appropriately       Please contact the office if your symptoms worsen or you have concerns that you are not improving.   Thank you for choosing [facility] for your healthcare, and for allowing [place] to partner with you on your healthcare journey. I am thankful to be able to provide care to you today.   Wyn Quaker FNP-C

## 2020-01-08 NOTE — Progress Notes (Signed)
@Patient  ID: Thomas Ramos, male    DOB: Oct 03, 1970, 49 y.o.   MRN: [id number]  Chief Complaint  Patient presents with  . Follow-up    OSA, using CPAP    Referring provider: Chesley Noon, MD  HPI:  49 year old male former smoker followed in our office for severe obstructive sleep apnea, history of COVID-19  PMH: Dysphagia, congenital cerebral palsy, GERD, depression, anxiety, chronic diastolic heart failure Smoker/ Smoking History: Former smoker Maintenance:  none Pt of: Dr. Halford Chessman  01/08/2020  - Visit   49 year old male former smoker followed in our office for severe obstructive sleep apnea.  Patient also with history of COVID-19.  Patient is followed by Dr. Halford Chessman.  He was started on CPAP therapy.  His DME company is [employer].  Unfortunately the patient is not in [place].  We have contacted [employer] to get patient added.  We have yet to hear back.  Patient reports that the DME company is told him that his mask is leaking.  He is unsure the severity of this.  He is also unsure of his AHI when using CPAP therapy.  Questionaires / Pulmonary Flowsheets:   ACT:  No flowsheet data found.  MMRC: No flowsheet data found.  Epworth:  No flowsheet data found.  Tests:   Pulmonary tests:   CT angio chest 08/15/16 >> large hiatal hernia  Sleep tests:   PSG 11/08/15 >> AHI 28.4, SpO2 low 61%  Auto CPAP 02/03/17 to 05/03/17 >> used on 90 of 90 nights with average 7 hrs 13 min.  Average AHI 1.7 with median CPAP 8 and 95 th percentile CPAP 12 cm H2O  HST 10/23/19 >> AHI 36.8, SpO2 low 81%  Cardiac tests:   Echo 03/15/17 >> EF 65 to 70%   FENO:  No results found for: NITRICOXIDE  PFT: No flowsheet data found.  WALK:  No flowsheet data found.  Imaging: No results found.  Lab Results:  CBC    Component Value Date/Time   WBC 11.8 (H) 03/27/2019 2125   RBC 5.70 03/27/2019 2125   HGB 14.4 03/27/2019 2125   HCT 45.6 03/27/2019 2125   PLT 241 03/27/2019 2125   MCV 80.0  03/27/2019 2125   MCH 25.3 (L) 03/27/2019 2125   MCHC 31.6 03/27/2019 2125   RDW 14.0 03/27/2019 2125   LYMPHSABS 0.8 03/27/2019 2125   MONOABS 0.9 03/27/2019 2125   EOSABS 0.0 03/27/2019 2125   BASOSABS 0.0 03/27/2019 2125    BMET    Component Value Date/Time   NA 139 03/27/2019 2125   K 3.7 03/27/2019 2125   CL 106 03/27/2019 2125   CO2 20 (L) 03/27/2019 2125   GLUCOSE 220 (H) 03/27/2019 2125   BUN 19 03/27/2019 2125   CREATININE 0.95 03/27/2019 2125   CREATININE 0.65 03/19/2016 1715   CALCIUM 8.8 (L) 03/27/2019 2125   GFRNONAA >60 03/27/2019 2125   GFRNONAA >89 03/19/2016 1715   GFRAA >60 03/27/2019 2125   GFRAA >89 03/19/2016 1715    BNP    Component Value Date/Time   BNP 19.5 09/16/2017 2221    ProBNP No results found for: PROBNP  Specialty Problems      Pulmonary Problems   Asthma exacerbation           Snoring    Overview:  Dr Corrie Dandy Pulmology      CAP (community acquired pneumonia)   OSA (obstructive sleep apnea)   Acute respiratory distress   Acute respiratory failure with  hypoxia (HCC)   PNA (pneumonia)   Pneumonia due to COVID-19 virus      Allergies  Allergen Reactions  . Penicillins Hives, Nausea And Vomiting and Other (See Comments)    Patient tolerated cefazolin in 2017 Has patient had a PCN reaction causing immediate rash, facial/tongue/throat swelling, SOB or lightheadedness with hypotension: Yes Has patient had a PCN reaction causing severe rash involving mucus membranes or skin necrosis: No Has patient had a PCN reaction that required hospitalization No Has patient had a PCN reaction occurring within the last 10 years: Yes If all of the above answers are "NO", then may proceed with Cephalosporin use.  . Sulfamethoxazole-Trimethoprim Nausea And Vomiting  . Metronidazole Nausea And Vomiting    Immunization History  Administered Date(s) Administered  . Influenza, Seasonal, Injecte, Preservative Fre 12/13/2014, 12/09/2015  .  Influenza-Unspecified 12/15/2015, 12/16/2017, 12/29/2018  . PFIZER SARS-COV-2 Vaccination 05/05/2019, 05/26/2019  . Tdap 07/09/2015    Past Medical History:  Diagnosis Date  . Anxiety   . Cellulitis 08/04/2017  . Cerebral palsy ([place])   . Chronic diastolic (congestive) heart failure ([place])   . CP (cerebral palsy) ([place])   . Diabetes mellitus without complication (HCC)    borderline  . Environmental allergies    takes inhalers if needed  . Esophageal stricture   . GERD (gastroesophageal reflux disease)   . Hypertension   . Motility disorder, esophageal   . Pneumonia   . 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 S/P Botox injection    approx every 4 months  . Seasonal allergies     Tobacco History: Social History   Tobacco Use  Smoking Status Never Smoker  Smokeless Tobacco Former Systems developer  Tobacco Comment   smoked one time   Counseling given: Yes Comment: smoked one time   Continue to not smoke  Outpatient Encounter Medications as of 01/08/2020  Medication Sig  . amLODipine (NORVASC) 10 MG tablet Take 10 mg by mouth daily.  Marland Kitchen aspirin 81 MG chewable tablet Chew by mouth daily.  . baclofen (LIORESAL) 10 MG tablet Take 5 mg by mouth at bedtime.   . budesonide-formoterol (SYMBICORT) 80-4.5 MCG/ACT inhaler Inhale 2 puffs into the lungs 2 (two) times daily.  . [employer] CLOTRIMAZOLE 1 % cream Apply 1 application topically 2 (two) times daily as needed (rash). BETWEEN TOES  . empagliflozin (JARDIANCE) 10 MG TABS tablet Take 10 mg by mouth daily.  . furosemide (LASIX) 20 MG tablet Take 20 mg by mouth daily.  Marland Kitchen latanoprost (XALATAN) 0.005 % ophthalmic solution Place 1 drop into both eyes daily as needed (dry eyes).   . metFORMIN (GLUCOPHAGE-XR) 500 MG 24 hr tablet Take 250 mg by mouth at bedtime.   . montelukast (SINGULAIR) 10 MG tablet Take 10 mg by mouth daily.   . mupirocin ointment (BACTROBAN) 2 % Apply 1 application topically daily as needed (rash).   . naproxen sodium (ALEVE) 220  MG tablet Take 220-440 mg by mouth 2 (two) times daily as needed (for pain).  . nitroGLYCERIN (NITROSTAT) 0.4 MG SL tablet Place 0.4 mg under the tongue every 5 (five) minutes as needed for chest pain.   Marland Kitchen omeprazole (PRILOSEC) 20 MG capsule Take 1 capsule by mouth every morning.  . ondansetron (ZOFRAN ODT) 4 MG disintegrating tablet Take 1 tablet (4 mg total) by mouth every 8 (eight) hours as needed for nausea or vomiting.  . potassium chloride (K-DUR) 10 MEQ tablet Take 10 mEq by mouth daily.  . rosuvastatin (CRESTOR) 5  MG tablet Take 5 mg by mouth at bedtime.  . valsartan (DIOVAN) 80 MG tablet Take 80 mg by mouth daily.   No facility-administered encounter medications on file as of 01/08/2020.     Review of Systems  Review of Systems  Constitutional: Negative for activity change, chills, fatigue, fever and unexpected weight change.  HENT: Negative for rhinorrhea, sinus pressure, sinus pain and sore throat.   Eyes: Negative.   Respiratory: Positive for shortness of breath. Negative for cough and wheezing.   Cardiovascular: Negative for chest pain and palpitations.  Gastrointestinal: Negative for constipation, diarrhea, nausea and vomiting.  Endocrine: Negative.   Genitourinary: Negative.   Musculoskeletal: Positive for gait problem.       Wheelchair bound  Skin: Negative.   Neurological: Negative for dizziness and headaches.  Psychiatric/Behavioral: Negative.  Negative for dysphoric mood. The patient is not nervous/anxious.   All other systems reviewed and are negative.    Physical Exam  BP 110/82 (BP Location: Left Arm, Cuff Size: Normal)   Pulse (!) 101   Temp 97.9 F (36.6 C) (Oral)   Wt 190 lb (86.2 kg)   SpO2 95%   BMI 31.62 kg/m   Wt Readings from Last 5 Encounters:  01/08/20 190 lb (86.2 kg)  03/21/19 190 lb (86.2 kg)  02/03/18 162 lb 14.7 oz (73.9 kg)  12/20/17 162 lb 14.7 oz (73.9 kg)  09/22/17 163 lb (73.9 kg)    BMI Readings from Last 5 Encounters:    01/08/20 31.62 kg/m  03/21/19 31.62 kg/m  02/03/18 27.11 kg/m  12/20/17 27.11 kg/m  09/22/17 27.12 kg/m     Physical Exam Vitals and nursing note reviewed.  Constitutional:      General: He is not in acute distress.    Appearance: Normal appearance. He is obese.  HENT:     Head: Normocephalic and atraumatic.     Right Ear: Hearing and external ear normal.     Left Ear: Hearing and external ear normal.     Nose: No mucosal edema.     Right Turbinates: Not enlarged.     Left Turbinates: Not enlarged.  Cardiovascular:     Rate and Rhythm: Normal rate and regular rhythm.     Pulses: Normal pulses.     Heart sounds: Normal heart sounds. No murmur heard.   Pulmonary:     Effort: Pulmonary effort is normal.     Breath sounds: No decreased breath sounds, wheezing or rales.  Musculoskeletal:     Cervical back: Normal range of motion.  Lymphadenopathy:     Cervical: No cervical adenopathy.  Skin:    General: Skin is warm and dry.     Capillary Refill: Capillary refill takes less than 2 seconds.     Findings: No erythema or rash.  Neurological:     General: No focal deficit present.     Mental Status: He is alert and oriented to person, place, and time.     Motor: No weakness.     Coordination: Coordination normal.     Gait: Gait is intact. Gait normal.  Psychiatric:        Mood and Affect: Mood normal.        Behavior: Behavior normal. Behavior is cooperative.        Thought Content: Thought content normal.        Judgment: Judgment normal.       Assessment & Plan:   OSA (obstructive sleep apnea) Plan: Continue CPAP therapy Mask fitting  from [place] Have contacted [employer] [facility] to get patient added to [place]    Return in about 3 months (around 04/09/2020), or if symptoms worsen or fail to improve, for Follow up with Dr. Halford Chessman.   Lauraine Rinne, NP 01/08/2020   This appointment required 32 minutes of patient care (this includes precharting, chart review,  review of results, face-to-face care, etc.).

## 2020-01-09 ENCOUNTER — Telehealth: Payer: Self-pay | Admitting: Pulmonary Disease

## 2020-01-09 NOTE — Telephone Encounter (Signed)
I tried to see the reason for the call from earlier but I could not find anything.  Amy, please advise on this.

## 2020-01-09 NOTE — Progress Notes (Signed)
Reviewed and agree with assessment/plan.   Chesley Mires, MD [facility] 01/09/2020, 8:21 AM Pager:  [phone]

## 2020-01-10 NOTE — Telephone Encounter (Signed)
I did not call patient yesterday. Thomas Ramos was in the [place] office yesterday so maybe someone called from there?

## 2020-01-12 ENCOUNTER — Telehealth: Payer: Self-pay | Admitting: Pulmonary Disease

## 2020-01-12 NOTE — Telephone Encounter (Signed)
Spoke with patient. He stated that he received a call from our office in regards to his cpap machine after his visit with Aaron Edelman on 01/08/20. I looked in his chart and did not see where anyone from our office called him. I advised him that depending on his cpap machine, it may have been [employer]. He verbalized understanding.

## 2020-01-12 NOTE — Telephone Encounter (Signed)
LMTCB

## 2020-01-19 ENCOUNTER — Telehealth: Payer: Self-pay | Admitting: Pulmonary Disease

## 2020-01-19 NOTE — Telephone Encounter (Signed)
Spoke to patient, who stated that he received a call from our office, he is not sure who called or what the call was in reference to. There are previous messages where vioce messages were left. I advised patient that this could be an older message, as I do not see where our office has attempted to contact him recently.  Patient voiced his understanding and had no further questions.  Nothing further needed.

## 2020-01-29 ENCOUNTER — Ambulatory Visit: Payer: Medicare Other | Admitting: Gastroenterology

## 2020-02-06 ENCOUNTER — Ambulatory Visit (INDEPENDENT_AMBULATORY_CARE_PROVIDER_SITE_OTHER): Payer: Medicare Other | Admitting: Gastroenterology

## 2020-02-06 ENCOUNTER — Encounter: Payer: Self-pay | Admitting: Gastroenterology

## 2020-02-06 VITALS — BP 122/84 | HR 120

## 2020-02-06 DIAGNOSIS — K529 Noninfective gastroenteritis and colitis, unspecified: Secondary | ICD-10-CM | POA: Diagnosis not present

## 2020-02-06 DIAGNOSIS — R1319 Other dysphagia: Secondary | ICD-10-CM

## 2020-02-06 DIAGNOSIS — Z1211 Encounter for screening for malignant neoplasm of colon: Secondary | ICD-10-CM

## 2020-02-06 DIAGNOSIS — K449 Diaphragmatic hernia without obstruction or gangrene: Secondary | ICD-10-CM | POA: Diagnosis not present

## 2020-02-06 NOTE — Progress Notes (Signed)
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  It has continued to be an issue, and he still wants to take Imodium to maybe have a bowel movement once or twice a week.  He is hoping for a "happy medium" that has not been achievable given his underlying tendency to chronic constipation from his CP and then the diarrhea caused by medicines.  Primary care note indicates other medicines for diabetes have not agree with him in 1 where the other.  He was referred back to [place] by primary care for screening colonoscopy. His caregiver was with him today.  Thomas Ramos has chronic dysphagia from a large hiatal hernia with distal esophageal tortuosity, and says that he is doing well with that.  Last upper endoscopy I did did not find E GJ outflow obstruction amenable to dilation or Botox injection.  ROS: Cardiovascular:  no chest pain Respiratory: no dyspnea  The patient's Past Medical, Family and Social History were reviewed and are on file in the EMR.  Objective:  Med list reviewed  Current Outpatient Medications:  .  amLODipine (NORVASC) 10 MG tablet, Take 10 mg by mouth daily., Disp: , Rfl:  .  aspirin 81 MG chewable tablet, Chew by mouth daily., Disp: , Rfl:  .  baclofen (LIORESAL) 10 MG tablet, Take 5 mg by mouth at bedtime. , Disp: , Rfl:  .  budesonide-formoterol (SYMBICORT) 80-4.5 MCG/ACT inhaler, Inhale 2 puffs into the lungs 2 (two) times daily., Disp: , Rfl:  .  empagliflozin (JARDIANCE) 10 MG TABS tablet, Take 10 mg by mouth daily., Disp: , Rfl:  .  furosemide (LASIX) 20 MG tablet, Take 20 mg by mouth daily., Disp: , Rfl: 1 .  latanoprost (XALATAN) 0.005 % ophthalmic solution, Place 1 drop into both eyes daily as needed (dry eyes). , Disp: , Rfl:  .  metFORMIN (GLUCOPHAGE-XR) 500 MG 24 hr tablet, Take 250 mg by mouth at bedtime. , Disp: , Rfl:  .  montelukast  (SINGULAIR) 10 MG tablet, Take 10 mg by mouth daily. , Disp: , Rfl:  .  naproxen sodium (ALEVE) 220 MG tablet, Take 220-440 mg by mouth 2 (two) times daily as needed (for pain)., Disp: , Rfl:  .  nitroGLYCERIN (NITROSTAT) 0.4 MG SL tablet, Place 0.4 mg under the tongue every 5 (five) minutes as needed for chest pain. , Disp: , Rfl:  .  omeprazole (PRILOSEC) 20 MG capsule, Take 1 capsule by mouth every morning., Disp: , Rfl:  .  ondansetron (ZOFRAN ODT) 4 MG disintegrating tablet, Take 1 tablet (4 mg total) by mouth every 8 (eight) hours as needed for nausea or vomiting., Disp: 5 tablet, Rfl: 0 .  potassium chloride (K-DUR) 10 MEQ tablet, Take 10 mEq by mouth daily., Disp: , Rfl:  .  rosuvastatin (CRESTOR) 5 MG tablet, Take 5 mg by mouth at bedtime., Disp: , Rfl:  .  valsartan (DIOVAN) 80 MG tablet, Take 80 mg by mouth daily., Disp: , Rfl:  .  [employer]S CLOTRIMAZOLE 1 % cream, Apply 1 application topically 2 (two) times daily as needed (rash). BETWEEN TOES (Patient not taking: Reported on 02/06/2020), Disp: , Rfl: 1 .  mupirocin ointment (BACTROBAN) 2 %, Apply 1 application topically daily as needed (rash).  (Patient not taking: Reported on 02/06/2020), Disp: , Rfl:    Vital signs in last 24 hrs: Vitals:  02/06/20 1515  BP: 122/84  Pulse: (!) 120    Physical Exam  Limited exam, wheelchair-bound His pulse had slowed to below 100 by the time I saw him.  Cardiac rhythm regular without appreciable murmur. Fair inspiratory effort, no wheezing or distress Abdomen soft, protuberant, no focal tenderness. Limb spasticity  Labs:   ___________________________________________ Radiologic studies:   ____________________________________________ Other:   _____________________________________________ Assessment & Plan  Assessment: Encounter Diagnoses  Name Primary?  . Chronic diarrhea Yes  . Special screening for malignant neoplasms, colon   . Esophageal dysphagia   . Hiatal hernia    He has  diarrhea from medications, but primary care notes indicate the Metformin has been best tolerated think so far as far as side effects and control of diabetes.  He was referred for colon cancer screening.  Given his spasticity from CP and limited mobility, colonoscopy would be a significant challenge both for prep and passage of the scope. We discussed Cologuard is a better option, and he was in favor of doing that.  He understands that if it is positive, it most likely means he has 1 or more precancerous colon polyps, possibly false positive and less likely colon cancer.  Positive test will mean the need for colonoscopy, which would require an elective hospitalization for the prep and procedure. He was agreeable to all that.  His caregiver was present and said he could definitely manage collecting the specimen for Cologuard.  Thomas Ramos makes his own decisions and was comfortable with the plan after all discussion.   30 minutes were spent on this encounter (including chart review, history/exam, counseling/coordination of care, and documentation)  [name]

## 2020-02-06 NOTE — Patient Instructions (Signed)
If you are age 47 or older, your body mass index should be between 23-30. Your There is no height or weight on file to calculate BMI. If this is out of the aforementioned range listed, please consider follow up with your Primary Care Provider.  If you are age 56 or younger, your body mass index should be between 19-25. Your There is no height or weight on file to calculate BMI. If this is out of the aformentioned range listed, please consider follow up with your Primary Care Provider.   Your provider has ordered Cologuard testing as an option for colon cancer screening. This is performed by [employer] and may be out of network with your insurance. PRIOR to completing the test, it is YOUR responsibility to contact your insurance about covered benefits for this test. Your out of pocket expense could be anywhere from $0.00 to $649.00.   When you call to check coverage with your insurer, please provide the following information:   -The ONLY provider of Cologuard is [place] code for Cologuard is [phone].  [employer] NPI # [id number]  -[employer] Tax ID # [id number]   We have already sent your demographic and insurance information to [employer] (phone number [phone]) and they should contact you within the next week regarding your test. If you have not heard from them within the next week, please call our office at [phone].  It was a pleasure to see you today!  Dr. Loletha Carrow

## 2020-02-26 DIAGNOSIS — R011 Cardiac murmur, unspecified: Secondary | ICD-10-CM | POA: Insufficient documentation

## 2020-03-04 ENCOUNTER — Telehealth: Payer: Self-pay | Admitting: Gastroenterology

## 2020-03-04 NOTE — Telephone Encounter (Signed)
Spoke with patient, he is calling because he lost the letter from [employer] and is requesting another letter. Advised that he will need to call [employer] and request that they resend the letter, I told him that I am not sure what they sent and do not have access to it. I provided patient with the number to [employer] that was listed in his last office note. Patient verbalized understanding of this information and had no other concerns at the end of the call.

## 2020-03-04 NOTE — Telephone Encounter (Signed)
Inbound call from patient requesting a call back.  Has questions about last visit he had on 02/06/20.

## 2020-03-13 ENCOUNTER — Other Ambulatory Visit: Payer: Self-pay

## 2020-03-13 ENCOUNTER — Encounter: Payer: Self-pay | Admitting: Podiatry

## 2020-03-13 ENCOUNTER — Ambulatory Visit (INDEPENDENT_AMBULATORY_CARE_PROVIDER_SITE_OTHER): Payer: Medicare Other | Admitting: Podiatry

## 2020-03-13 DIAGNOSIS — M79674 Pain in right toe(s): Secondary | ICD-10-CM | POA: Diagnosis not present

## 2020-03-13 DIAGNOSIS — M79675 Pain in left toe(s): Secondary | ICD-10-CM

## 2020-03-13 DIAGNOSIS — B351 Tinea unguium: Secondary | ICD-10-CM

## 2020-03-13 DIAGNOSIS — E118 Type 2 diabetes mellitus with unspecified complications: Secondary | ICD-10-CM

## 2020-03-15 NOTE — Progress Notes (Signed)
Subjective: Thomas Ramos is a pleasant 49 y.o. male patient seen today preventative diabetic foot care and painful thick toenails that are difficult to trim. Pain interferes with ambulation. Aggravating factors include wearing enclosed shoe gear. Pain is relieved with periodic professional debridement.   He is accompanied by his group home caretaker. They voice no new pedal problems on today's visit.  Past Medical History:  Diagnosis Date  . Anxiety   . Cellulitis 08/04/2017  . Cerebral palsy ([place])   . Chronic diastolic (congestive) heart failure ([place])   . CP (cerebral palsy) ([place])   . Diabetes mellitus without complication (HCC)    borderline  . Environmental allergies    takes inhalers if needed  . Esophageal stricture   . GERD (gastroesophageal reflux disease)   . Hypertension   . Motility disorder, esophageal   . Pneumonia   . 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 S/P Botox injection    approx every 4 months  . Seasonal allergies     Patient Active Problem List   Diagnosis Date Noted  . Heart murmur 02/26/2020  . Pneumonia due to COVID-19 virus 03/21/2019  . COVID-19 03/21/2019  . Hiatal hernia   . Cellulitis of right lower extremity without foot 08/03/2017  . Type 2 diabetes mellitus with complication, without long-term current use of insulin ([place]) 08/03/2017  . PNA (pneumonia) 05/07/2017  . SIRS (systemic inflammatory response syndrome) (HCC)   . Acute respiratory failure with hypoxia ([place]) 03/13/2017  . 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 Rhinovirus infection 08/17/2016  . Pressure injury of skin 08/16/2016  . Acute respiratory distress 08/15/2016  . Tachycardia 08/15/2016  . Sepsis ([place]) 08/15/2016  . Onychomycosis 02/20/2016  . Essential hypertension   . OSA (obstructive sleep apnea) 12/11/2015  . Chronic diastolic (congestive) heart failure ([place])   . Chronic venous insufficiency   . Depression with anxiety 09/20/2015  . CAP (community acquired  pneumonia) 09/20/2015  . Hypersomnia 07/16/2015  . Asthma exacerbation 07/16/2015  . GERD (gastroesophageal reflux disease) 07/16/2015  . Snoring 07/16/2015  . Hematemesis with nausea   . Fever 05/24/2014  . Hyperlipidemia 02/22/2012  . Dysphagia 09/16/2010  . 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 amLODipine (NORVASC) 10 MG tablet Take 10 mg by mouth daily.    Marland Kitchen aspirin 81 MG chewable tablet Chew by mouth daily.    . baclofen (LIORESAL) 10 MG tablet Take 5 mg by mouth at bedtime.     . budesonide-formoterol (SYMBICORT) 80-4.5 MCG/ACT inhaler Inhale 2 puffs into the lungs 2 (two) times daily.    . [employer] CLOTRIMAZOLE 1 % cream Apply 1 application topically 2 (two) times daily as needed (rash). BETWEEN TOES (Patient not taking: Reported on 02/06/2020)  1  . empagliflozin (JARDIANCE) 10 MG TABS tablet Take 10 mg by mouth daily.    . furosemide (LASIX) 20 MG tablet Take 20 mg by mouth daily.  1  . latanoprost (XALATAN) 0.005 % ophthalmic solution Place 1 drop into both eyes daily as needed (dry eyes).     . metFORMIN (GLUCOPHAGE-XR) 500 MG 24 hr tablet Take 250 mg by mouth at bedtime.     . montelukast (SINGULAIR) 10 MG tablet Take 10 mg by mouth daily.     . mupirocin ointment (BACTROBAN) 2 % Apply 1 application topically daily as needed (rash).  (Patient not taking: Reported on 02/06/2020)    .  naproxen sodium (ALEVE) 220 MG tablet Take 220-440 mg by mouth 2 (two) times daily as needed (for pain).    . nitroGLYCERIN (NITROSTAT) 0.4 MG SL tablet Place 0.4 mg under the tongue every 5 (five) minutes as needed for chest pain.     Marland Kitchen omeprazole (PRILOSEC) 20 MG capsule Take 1 capsule by mouth every morning.    . ondansetron (ZOFRAN ODT) 4 MG disintegrating tablet Take 1 tablet (4 mg total) by mouth every 8 (eight) hours as needed for nausea or vomiting. 5 tablet 0  . potassium chloride (K-DUR) 10 MEQ  tablet Take 10 mEq by mouth daily.    . rosuvastatin (CRESTOR) 5 MG tablet Take 5 mg by mouth at bedtime.    . valsartan (DIOVAN) 80 MG tablet Take 80 mg by mouth daily.     No current facility-administered medications on file prior to visit.    Allergies  Allergen Reactions  . Penicillins Hives, Nausea And Vomiting and Other (See Comments)    Patient tolerated cefazolin in 2017 Has patient had a PCN reaction causing immediate rash, facial/tongue/throat swelling, SOB or lightheadedness with hypotension: Yes Has patient had a PCN reaction causing severe rash involving mucus membranes or skin necrosis: No Has patient had a PCN reaction that required hospitalization No Has patient had a PCN reaction occurring within the last 10 years: Yes If all of the above answers are "NO", then may proceed with Cephalosporin use.  . Sulfamethoxazole-Trimethoprim Nausea And Vomiting  . Metronidazole Nausea And Vomiting    Objective: Physical Exam  General: Thomas Ramos is a pleasant 49 y.o.  male, in NAD. AAO x 3.   Vascular:  Neurovascular status unchanged b/l lower extremities. Capillary fill time to digits <3 seconds b/l lower extremities. Palpable pedal pulses b/l LE. Pedal hair absent. Lower extremity skin temperature gradient within normal limits. No pain with calf compression b/l. Nonpitting edema noted b/l lower extremities.  Dermatological:  Pedal skin with normal turgor, texture and tone bilaterally. No open wounds bilaterally. No interdigital macerations bilaterally. Toenails 1-5 b/l elongated, discolored, dystrophic, thickened, crumbly with subungual debris and tenderness to dorsal palpation.  Musculoskeletal:  Flaccid lower extremities noted b/l. No pain crepitus or joint limitation noted with ROM b/l. No gross bony deformities bilaterally. Utilizes motorized chair for mobility assistance.  Neurological:  Protective sensation intact 5/5 intact bilaterally with 10g monofilament  b/l.  Assessment and Plan:  1. Pain due to onychomycosis of toenails of both feet   2. Type 2 diabetes mellitus with complication, without long-term current use of insulin (HCC)    -Examined patient. -No new findings. No new orders. -Continue diabetic foot care principles. -Toenails 1-5 b/l were debrided in length and girth with sterile nail nippers and dremel without iatrogenic bleeding.  -Patient to report any pedal injuries to medical professional immediately. -Patient to continue soft, supportive shoe gear daily. -Patient/POA to call should there be question/concern in the interim.  Return in about 3 months (around 06/11/2020) for painful mycotic toenails.  Freddie Breech, DPM

## 2020-04-03 NOTE — Progress Notes (Signed)
Patient had to leave before being seen due to transportation. Plan to reschedule. Discussed with patient who is in agreement.

## 2020-04-04 ENCOUNTER — Ambulatory Visit (INDEPENDENT_AMBULATORY_CARE_PROVIDER_SITE_OTHER): Payer: Medicare Other | Admitting: Internal Medicine

## 2020-04-04 ENCOUNTER — Telehealth: Payer: Self-pay | Admitting: *Deleted

## 2020-04-04 ENCOUNTER — Other Ambulatory Visit: Payer: Self-pay

## 2020-04-04 ENCOUNTER — Encounter: Payer: Self-pay | Admitting: Internal Medicine

## 2020-04-04 VITALS — BP 120/80 | HR 103

## 2020-04-04 DIAGNOSIS — I10 Essential (primary) hypertension: Secondary | ICD-10-CM

## 2020-04-04 NOTE — Telephone Encounter (Signed)
Notes received sent to referral for scheduling. [place]

## 2020-04-22 ENCOUNTER — Ambulatory Visit: Payer: Medicare Other | Admitting: Internal Medicine

## 2020-04-25 ENCOUNTER — Ambulatory Visit (INDEPENDENT_AMBULATORY_CARE_PROVIDER_SITE_OTHER): Payer: Medicare Other | Admitting: Internal Medicine

## 2020-04-25 ENCOUNTER — Other Ambulatory Visit: Payer: Self-pay

## 2020-04-25 VITALS — BP 111/75 | HR 103 | Wt 145.0 lb

## 2020-04-25 DIAGNOSIS — I872 Venous insufficiency (chronic) (peripheral): Secondary | ICD-10-CM

## 2020-04-25 DIAGNOSIS — R9431 Abnormal electrocardiogram [ECG] [EKG]: Secondary | ICD-10-CM

## 2020-04-25 DIAGNOSIS — R5383 Other fatigue: Secondary | ICD-10-CM

## 2020-04-25 DIAGNOSIS — I10 Essential (primary) hypertension: Secondary | ICD-10-CM

## 2020-04-25 DIAGNOSIS — R000 Tachycardia, unspecified: Secondary | ICD-10-CM | POA: Diagnosis not present

## 2020-04-25 DIAGNOSIS — R0603 Acute respiratory distress: Secondary | ICD-10-CM | POA: Diagnosis not present

## 2020-04-25 DIAGNOSIS — I5032 Chronic diastolic (congestive) heart failure: Secondary | ICD-10-CM

## 2020-04-25 DIAGNOSIS — E785 Hyperlipidemia, unspecified: Secondary | ICD-10-CM

## 2020-04-25 NOTE — Patient Instructions (Addendum)
Medication Instructions:   -Stop taking metoprolol for fatigue. You may stop taking this medication today.   *If you need a refill on your cardiac medications before your next appointment, please call your pharmacy*   Testing/Procedures: Your physician has requested that you have an echocardiogram. Echocardiography is a painless test that uses sound waves to create images of your heart. It provides your doctor with information about the size and shape of your heart and how well your heart's chambers and valves are working. This procedure takes approximately one hour. There are no restrictions for this procedure. This procedure will be done at [facility], [place], [place], [place] [address]   Follow-Up: At [facility], you and your health needs are our priority.  As part of our continuing mission to provide you with exceptional heart care, we have created designated Provider Care Teams.  These Care Teams include your primary Cardiologist (physician) and Advanced Practice Providers (APPs -  Physician Assistants and Nurse Practitioners) who all work together to provide you with the care you need, when you need it.  We recommend signing up for the patient portal called "MyChart".  Sign up information is provided on this After Visit Summary.  MyChart is used to connect with patients for Virtual Visits (Telemedicine).  Patients are able to view lab/test results, encounter notes, upcoming appointments, etc.  Non-urgent messages can be sent to your provider as well.   To learn more about what you can do with MyChart, go to [email or web address].    Your next appointment:   6 month(s)  The format for your next appointment:   In Person  Provider:   Cherlynn Kaiser, MD

## 2020-04-25 NOTE — Progress Notes (Signed)
Cardiology Office Note:    Date:  04/25/2020   ID:  Thomas Ramos, DOB Feb 20, 1971, MRN [id number]  PCP:  Chesley Noon, MD  Cardiologist:  No primary care provider on file.  Electrophysiologist:  None   Referring MD: Aura Dials, PA-C   Chief Complaint/Reason for Referral: "HTN, heart murmur, sinus tachycardia"  History of Present Illness:    Thomas Ramos is a 50 y.o. male with a history of DM2, "heart murmur" unspecified details, chronic venous insufficiency, hiatal hernia, sleep apnea, esophageal stricture, HLD, cerebral palsy requiring use of a wheelchair, HTN who presents for further evaluation of murmur, HTN and sinus tachycardia. Presents with his aide who provides some history.   Stevie denies chest pain, chest pressure, dyspnea at rest or with exertion, palpitations, PND, orthopnea, or leg swelling. Denies cough, fever, chills. Denies nausea, vomiting. Denies syncope or presyncope. Denies dizziness or lightheadedness.   He is primarily bothered by fatigue. He has been placed on metoprolol for BP and sinus tachycardia. We discussed stopping metoprolol for benefit of fatigue, since he feels fatigue has worsened while on this medication.   Historically noted to have HFpEF but currently appears euvolemic.   Fhx of DM and HTN, no family history of CAD or MI noted.  Diabetes with A1C of 7.5 %.   TSH normal. Does not have symptoms with sinus tachycardia.   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[place])    S/P Botox injection    approx every 4 months    Seasonal allergies     Past Surgical History:  Procedure Laterality Date   BOTOX INJECTION N/A 12/21/2012   Procedure: BOTOX INJECTION;  Surgeon: Inda Castle, MD;  Location: [facility];  Service: Endoscopy;  Laterality: N/A;   BOTOX INJECTION N/A 06/28/2014   Procedure: BOTOX INJECTION;  Surgeon: Inda Castle, MD;  Location: [facility];  Service: Endoscopy;  Laterality: N/A;   ESOPHAGOGASTRODUODENOSCOPY N/A 12/21/2012   Procedure: ESOPHAGOGASTRODUODENOSCOPY (EGD);  Surgeon: Inda Castle, MD;  Location: [facility];  Service: Endoscopy;  Laterality: N/A;   ESOPHAGOGASTRODUODENOSCOPY N/A 06/28/2014   Procedure: ESOPHAGOGASTRODUODENOSCOPY (EGD);  Surgeon: Inda Castle, MD;  Location: [facility];  Service: Endoscopy;  Laterality: N/A;   ESOPHAGOGASTRODUODENOSCOPY (EGD) WITH PROPOFOL N/A 09/03/2017   Procedure: ESOPHAGOGASTRODUODENOSCOPY (EGD) WITH PROPOFOL;  Surgeon: Doran Stabler, MD;  Location: [facility];  Service: Gastroenterology;  Laterality: N/A;   ESOPHAGUS SURGERY     stretched esophagus   legs     MOUTH SURGERY     TENDON RELEASE      Current Medications: Current Meds  Medication Sig   amLODipine (NORVASC) 10 MG tablet Take 10 mg by mouth daily.   aspirin 81 MG chewable tablet Chew by mouth daily.   baclofen (LIORESAL) 10 MG tablet Take 5 mg by mouth at bedtime.    budesonide-formoterol (SYMBICORT) 80-4.5 MCG/ACT inhaler Inhale 2 puffs into the lungs 2 (two) times daily.   [employer] CLOTRIMAZOLE 1 % cream Apply 1 application topically  2 (two) times daily as needed (rash). BETWEEN TOES   Dulaglutide 0.75 MG/0.5ML SOPN Inject 0.75 mg into the skin once a week. Tuesdays   latanoprost (XALATAN) 0.005 % ophthalmic solution Place 1 drop into both eyes daily as needed (dry eyes).    montelukast (SINGULAIR) 10 MG tablet Take 10 mg by mouth daily.    naproxen sodium (ALEVE) 220 MG tablet Take 220-440 mg by mouth 2 (two) times daily as needed (for pain).   nitroGLYCERIN  (NITROSTAT) 0.4 MG SL tablet Place 0.4 mg under the tongue every 5 (five) minutes as needed for chest pain.    omeprazole (PRILOSEC) 20 MG capsule Take 1 capsule by mouth every morning.   rosuvastatin (CRESTOR) 5 MG tablet Take 5 mg by mouth at bedtime.   valsartan (DIOVAN) 80 MG tablet Take 80 mg by mouth daily.   [DISCONTINUED] empagliflozin (JARDIANCE) 10 MG TABS tablet Take 10 mg by mouth daily.   [DISCONTINUED] furosemide (LASIX) 20 MG tablet Take 20 mg by mouth daily.   [DISCONTINUED] metFORMIN (GLUCOPHAGE-XR) 500 MG 24 hr tablet Take 250 mg by mouth at bedtime.  (Patient not taking: No sig reported)   [DISCONTINUED] metoprolol succinate (TOPROL-XL) 25 MG 24 hr tablet Take by mouth.   [DISCONTINUED] mupirocin ointment (BACTROBAN) 2 % Apply 1 application topically daily as needed (rash).   [DISCONTINUED] ondansetron (ZOFRAN ODT) 4 MG disintegrating tablet Take 1 tablet (4 mg total) by mouth every 8 (eight) hours as needed for nausea or vomiting. (Patient not taking: No sig reported)   [DISCONTINUED] potassium chloride (K-DUR) 10 MEQ tablet Take 10 mEq by mouth daily.     Allergies:   Penicillins, Sulfamethoxazole-trimethoprim, and Metronidazole   Social History   Tobacco Use   Smoking status: Never Smoker   Smokeless tobacco: Former Systems developer   Tobacco comment: smoked one time  Media planner   Vaping Use: Never used  Substance Use Topics   Alcohol use: Yes    Comment: 2 beers every other Friday and Saturday   Drug use: No     Family History: The patient's family history includes Diabetes in his mother; Hypertension in his father; Lung cancer in his father.  ROS:   Please see the history of present illness.    All other systems reviewed and are negative.  EKGs/Labs/Other Studies Reviewed:    The following studies were reviewed today:  EKG:  Sinus tachcyardia rate 110  I have independently reviewed the images from CT angio chest 09/17/2017.  Recent Labs: No results found for  requested labs within last 8760 hours.  Recent Lipid Panel    Component Value Date/Time   CHOL 194 04/17/2010 2056   TRIG 137 03/21/2019 1326   HDL 27 (L) 04/17/2010 2056   CHOLHDL 7.2 Ratio 04/17/2010 2056   VLDL 35 04/17/2010 2056   LDLCALC 132 (H) 04/17/2010 2056    Physical Exam:    VS:  BP 111/75    Pulse (!) 103    Wt 145 lb (65.8 kg)    BMI 24.13 kg/m     Wt Readings from Last 5 Encounters:  01/08/20 190 lb (86.2 kg)  03/21/19 190 lb (86.2 kg)  02/03/18 162 lb 14.7 oz (73.9 kg)  12/20/17 162 lb 14.7 oz (73.9 kg)  09/22/17 163 lb (73.9 kg)    Constitutional: No acute distress Eyes: sclera non-icteric, normal conjunctiva and lids ENMT: moist mucous membranes Cardiovascular: regular rhythm, tachycardic, soft systolic murmur. S1 and S2 normal. No jugular venous distention.  Respiratory: clear to auscultation bilaterally GI : normal bowel sounds, soft and nontender. No distention.   MSK: no edema. MSK deformities related to CP NEURO: grossly nonfocal exam, moves all extremities. PSYCH: alert and oriented x 3, normal mood and affect.   ASSESSMENT:    1. Essential hypertension   2. Acute respiratory distress   3. Tachycardia   4. Abnormal electrocardiogram (ECG) (EKG)    5. Chronic diastolic (congestive) heart failure (HCC)   6. Chronic venous insufficiency   7. Hyperlipidemia, unspecified hyperlipidemia type   8. Fatigue, unspecified type    PLAN:    Essential hypertension - Plan: EKG 12-Lead Acute respiratory distress Tachycardia - Plan: EKG 12-Lead, ECHOCARDIOGRAM COMPLETE Abnormal electrocardiogram (ECG) (EKG)  - Plan: ECHOCARDIOGRAM COMPLETE  Will obtain an echocardiogram for murmur and tachycardia. Patient is not having chest pain, no strong indication for stress testing at this time. CTA chest from 2019 shows no coronary calcifications.  For HTN continue amlodipine 10 mg daily, furosemide 20 mg daily, and valsartan 80 mg daily. BP normal today.    Fatigue-  Hold metoprolol for fatigue.   For HLD, continue crestor 5 mg daily. Lipids demonstrate LDL 86, however trig 311 and require modification. Consider intensification of statin therapy. Will have labs rechecked with PCP later this year.   Total time of encounter: 65 minutes total time of encounter, including 35 minutes spent in face-to-face patient care on the date of this encounter. This time includes coordination of care and counseling regarding above mentioned problem list. Remainder of non-face-to-face time involved reviewing chart documents/testing relevant to the patient encounter and documentation in the medical record. I have independently reviewed documentation from referring provider.   Cherlynn Kaiser, MD [place]   [facility]    Medication Adjustments/Labs and Tests Ordered: Current medicines are reviewed at length with the patient today.  Concerns regarding medicines are outlined above.   Orders Placed This Encounter  Procedures   EKG 12-Lead   ECHOCARDIOGRAM COMPLETE    Shared Decision Making/Informed Consent:       No orders of the defined types were placed in this encounter.   Patient Instructions  Medication Instructions:   -Stop taking metoprolol for fatigue. You may stop taking this medication today.   *If you need a refill on your cardiac medications before your next appointment, please call your pharmacy*   Testing/Procedures: Your physician has requested that you have an echocardiogram. Echocardiography is a painless test that uses sound waves to create images of your heart. It provides your doctor with information about the size and shape of your heart and how well your hearts chambers and valves are working. This procedure takes approximately one hour. There are no restrictions for this procedure. This procedure will be done at [facility], [place], [place], [place] [address]   Follow-Up: At [facility],  you and your health needs are our priority.  As part of our continuing mission to provide you with exceptional heart care, we have created designated Provider Care Teams.  These Care Teams include your primary Cardiologist (physician) and Advanced Practice Providers (APPs -  Physician Assistants and Nurse Practitioners) who all work together to provide you with the care you need, when you need it.  We recommend signing up for the patient portal called "MyChart".  Sign up information is provided on this After Visit Summary.  MyChart is used to connect with patients for Virtual Visits (Telemedicine).  Patients are able to view  lab/test results, encounter notes, upcoming appointments, etc.  Non-urgent messages can be sent to your provider as well.   To learn more about what you can do with MyChart, go to [email or web address].    Your next appointment:   6 month(s)  The format for your next appointment:   In Person  Provider:   Cherlynn Kaiser, MD

## 2020-05-02 ENCOUNTER — Telehealth: Payer: Self-pay | Admitting: Gastroenterology

## 2020-05-02 NOTE — Telephone Encounter (Signed)
Spoke with patient, he states that his caregiver is concerned about his bowel habits, pt states that he has had a bowel movement the last 2 days and he thinks its common sense that he wouldn't have regular bowel habits since he is in a wheelchair. Patient requested to discuss further with Dr. Loletha Carrow. Patient's appt has been rescheduled to Thursday, 05/09/20 at 3:40 PM. Patient verbalized understanding of this information and had no concerns at the end of the call.

## 2020-05-02 NOTE — Telephone Encounter (Signed)
Patient is requesting to speak with a nurse did not provide any information.

## 2020-05-04 ENCOUNTER — Other Ambulatory Visit: Payer: Self-pay

## 2020-05-04 ENCOUNTER — Encounter: Payer: Self-pay | Admitting: Emergency Medicine

## 2020-05-04 ENCOUNTER — Ambulatory Visit
Admission: EM | Admit: 2020-05-04 | Discharge: 2020-05-04 | Disposition: A | Discharge status: Home / Self Care | Payer: Medicare Other | Attending: Emergency Medicine | Admitting: Emergency Medicine

## 2020-05-04 DIAGNOSIS — K5900 Constipation, unspecified: Secondary | ICD-10-CM | POA: Diagnosis not present

## 2020-05-04 DIAGNOSIS — R0602 Shortness of breath: Secondary | ICD-10-CM

## 2020-05-04 MED ORDER — POLYETHYLENE GLYCOL 3350 17 G PO PACK: 17.0000 g | PACK | Freq: Every day | ORAL | 0 refills | Status: DC

## 2020-05-04 MED ORDER — DOCUSATE SODIUM 100 MG PO CAPS: 100.0000 mg | ORAL_CAPSULE | Freq: Two times a day (BID) | ORAL | 0 refills | Status: DC

## 2020-05-04 NOTE — ED Provider Notes (Signed)
[facility]    CSN: [id number] Arrival date & time: 05/04/20  1036      History   Chief Complaint Chief Complaint  Patient presents with  . Shortness of Breath    HPI Thomas Ramos is a 50 y.o. male history of cerebral palsy, chronic diastolic heart failure, diabetes, GERD, hypertension, presenting today for evaluation of shortness of breath and constipation.  Reports that he has been breathing more heavily recently.  Denies any significant cough, reports isolated cough that nighttime x2 days, but no significant daytime cough.  Denies fevers chills or body aches.  Denies difficulty sleeping or needing to sleep more upright.  Denies any fluid or swelling.  Expresses concerns regarding infrequent bowel movements.  Reports bowel movements every 1-1.5 weeks.  Occasionally will take Imodium to prevent any accidents when out in public.  Normal oral intake.  Passing gas.  Caregiver reports abdomen will be firm and tight at times.  Patient currently denying abdominal pain.  HPI  Past Medical History:  Diagnosis Date  . Anxiety   . Cellulitis 08/04/2017  . Cerebral palsy ([place])   . Chronic diastolic (congestive) heart failure ([place])   . CP (cerebral palsy) ([place])   . Diabetes mellitus without complication (HCC)    borderline  . Environmental allergies    takes inhalers if needed  . Esophageal stricture   . GERD (gastroesophageal reflux disease)   . Hypertension   . Motility disorder, esophageal   . Pneumonia   . 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 S/P Botox injection    approx every 4 months  . Seasonal allergies     Patient Active Problem List   Diagnosis Date Noted  . Heart murmur 02/26/2020  . Pneumonia due to COVID-19 virus 03/21/2019  . COVID-19 03/21/2019  . Hiatal hernia   . Cellulitis of right lower extremity without foot 08/03/2017  . Type 2 diabetes mellitus with complication, without long-term current use of insulin ([place]) 08/03/2017  . PNA  (pneumonia) 05/07/2017  . SIRS (systemic inflammatory response syndrome) (HCC)   . Acute respiratory failure with hypoxia ([place]) 03/13/2017  . 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 Rhinovirus infection 08/17/2016  . Pressure injury of skin 08/16/2016  . Acute respiratory distress 08/15/2016  . Tachycardia 08/15/2016  . Sepsis ([place]) 08/15/2016  . Onychomycosis 02/20/2016  . Essential hypertension   . OSA (obstructive sleep apnea) 12/11/2015  . Chronic diastolic (congestive) heart failure ([place])   . Chronic venous insufficiency   . Depression with anxiety 09/20/2015  . CAP (community acquired pneumonia) 09/20/2015  . Hypersomnia 07/16/2015  . Asthma exacerbation 07/16/2015  . GERD (gastroesophageal reflux disease) 07/16/2015  . Snoring 07/16/2015  . Hematemesis with nausea   . Fever 05/24/2014  . Hyperlipidemia 02/22/2012  . Dysphagia 09/16/2010  . 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 BOTOX INJECTION N/A 12/21/2012   Procedure: BOTOX INJECTION;  Surgeon: Inda Castle, MD;  Location: [facility];  Service: Endoscopy;  Laterality: N/A;  . BOTOX INJECTION N/A 06/28/2014   Procedure: BOTOX INJECTION;  Surgeon: Inda Castle, MD;  Location: [facility];  Service: Endoscopy;  Laterality: N/A;  . ESOPHAGOGASTRODUODENOSCOPY N/A 12/21/2012   Procedure: ESOPHAGOGASTRODUODENOSCOPY (EGD);  Surgeon: Inda Castle, MD;  Location: [facility];  Service: Endoscopy;  Laterality: N/A;  . ESOPHAGOGASTRODUODENOSCOPY N/A 06/28/2014   Procedure: ESOPHAGOGASTRODUODENOSCOPY (EGD);  Surgeon: Inda Castle, MD;  Location: [facility];  Service:  Endoscopy;  Laterality: N/A;  . ESOPHAGOGASTRODUODENOSCOPY (EGD) WITH PROPOFOL N/A 09/03/2017   Procedure: ESOPHAGOGASTRODUODENOSCOPY (EGD) WITH PROPOFOL;  Surgeon: Doran Stabler, MD;  Location: [facility];  Service: Gastroenterology;  Laterality: N/A;  . ESOPHAGUS SURGERY     stretched  esophagus  . legs    . MOUTH SURGERY    . TENDON RELEASE         Home Medications    Prior to Admission medications   Medication Sig Start Date End Date Taking? Authorizing Provider  docusate sodium (COLACE) 100 MG capsule Take 1 capsule (100 mg total) by mouth every 12 (twelve) hours. 05/04/20  Yes Taylia Berber C, PA-C  polyethylene glycol (MIRALAX / GLYCOLAX) 17 g packet Take 17 g by mouth daily. 05/04/20  Yes Rosaly Labarbera C, PA-C  amLODipine (NORVASC) 10 MG tablet Take 10 mg by mouth daily.    [provider]  aspirin 81 MG chewable tablet Chew by mouth daily.    [provider]  baclofen (LIORESAL) 10 MG tablet Take 5 mg by mouth at bedtime.  06/01/18   [provider]  budesonide-formoterol (SYMBICORT) 80-4.5 MCG/ACT inhaler Inhale 2 puffs into the lungs 2 (two) times daily.    [provider]  CVS CLOTRIMAZOLE 1 % cream Apply 1 application topically 2 (two) times daily as needed (rash). BETWEEN TOES 08/19/17   [provider]  Dulaglutide 0.75 MG/0.5ML SOPN Inject into the skin. 02/26/20   [provider]  empagliflozin (JARDIANCE) 10 MG TABS tablet Take 10 mg by mouth daily.    [provider]  furosemide (LASIX) 20 MG tablet Take 20 mg by mouth daily. 07/15/17   [provider]  latanoprost (XALATAN) 0.005 % ophthalmic solution Place 1 drop into both eyes daily as needed (dry eyes).     [provider]  metFORMIN (GLUCOPHAGE-XR) 500 MG 24 hr tablet Take 250 mg by mouth at bedtime.  02/14/19   [provider]  montelukast (SINGULAIR) 10 MG tablet Take 10 mg by mouth daily.  08/12/16   [provider]  mupirocin ointment (BACTROBAN) 2 % Apply 1 application topically daily as needed (rash). 04/30/14   [provider]  naproxen sodium (ALEVE) 220 MG tablet Take 220-440 mg by mouth 2 (two) times daily as needed (for pain).    [provider]  nitroGLYCERIN (NITROSTAT) 0.4 MG  SL tablet Place 0.4 mg under the tongue every 5 (five) minutes as needed for chest pain.     [provider]  omeprazole (PRILOSEC) 20 MG capsule Take 1 capsule by mouth every morning. 12/12/19   [provider]  ondansetron (ZOFRAN ODT) 4 MG disintegrating tablet Take 1 tablet (4 mg total) by mouth every 8 (eight) hours as needed for nausea or vomiting. 03/27/19   Petrucelli, Samantha R, PA-C  potassium chloride (K-DUR) 10 MEQ tablet Take 10 mEq by mouth daily.    [provider]  rosuvastatin (CRESTOR) 5 MG tablet Take 5 mg by mouth at bedtime.    [provider]  valsartan (DIOVAN) 80 MG tablet Take 80 mg by mouth daily.    [provider]    Family History Family History  Problem Relation Age of Onset  . Hypertension Father   . Lung cancer Father   . Diabetes Mother     Social History Social History   Tobacco Use  . Smoking status: Never Smoker  . Smokeless tobacco: Former Systems developer  . Tobacco comment: smoked one time  Vaping Use  . Vaping Use: Never used  Substance Use Topics  . Alcohol use: Yes    Comment: 2 beers every other Friday and Saturday  . Drug use: No     Allergies   Penicillins, Sulfamethoxazole-trimethoprim, and Metronidazole   Review of Systems Review of Systems  Constitutional: Negative for activity change, appetite change, chills, fatigue and fever.  HENT: Negative for congestion, ear pain, rhinorrhea, sinus pressure, sore throat and trouble swallowing.   Eyes: Negative for discharge and redness.  Respiratory: Positive for shortness of breath. Negative for cough and chest tightness.   Cardiovascular: Negative for chest pain.  Gastrointestinal: Positive for constipation. Negative for abdominal pain, diarrhea, nausea and vomiting.  Musculoskeletal: Negative for myalgias.  Skin: Negative for rash.  Neurological: Negative for dizziness, light-headedness and headaches.     Physical Exam Triage Vital Signs ED  Triage Vitals [05/04/20 1049]  Enc Vitals Group     BP 139/86     Pulse Rate 96     Resp 18     Temp 99.3 F (37.4 C)     Temp Source Temporal     SpO2 97 %     Weight      Height      Head Circumference      Peak Flow      Pain Score 0     Pain Loc      Pain Edu?      Excl. in [place]?    No data found.  Updated Vital Signs BP 139/86 (BP Location: Left Arm)   Pulse 96   Temp 99.3 F (37.4 C) (Temporal)   Resp 18   SpO2 97%   Visual Acuity Right Eye Distance:   Left Eye Distance:   Bilateral Distance:    Right Eye Near:   Left Eye Near:    Bilateral Near:     Physical Exam Vitals and nursing note reviewed.  Constitutional:      Appearance: He is well-developed and well-nourished.     Comments: No acute distress  HENT:     Head: Normocephalic and atraumatic.     Nose: Nose normal.  Eyes:     Conjunctiva/sclera: Conjunctivae normal.  Cardiovascular:     Rate and Rhythm: Normal rate and regular rhythm.  Pulmonary:     Effort: Pulmonary effort is normal. No respiratory distress.     Comments: Breathing comfortably at rest, CTABL, no wheezing, rales or other adventitious sounds auscultated Abdominal:     General: There is no distension.     Comments: Soft, minimally distended, nontender to light and deep outpatient throughout entire abdomen  Musculoskeletal:        General: Normal range of motion.     Cervical back: Neck supple.     Comments: No swelling noted to lower extremities  Skin:    General: Skin is warm and dry.  Neurological:     Mental Status: He is alert and oriented to person, place, and time.  Psychiatric:        Mood and Affect: Mood and affect normal.      UC Treatments / Results  Labs (all labs ordered are listed, but only abnormal results are displayed) Labs Reviewed - No data to display  EKG   Radiology No results found.  Procedures Procedures (including critical care time)  Medications Ordered in UC Medications - No data to  display  Initial Impression / Assessment and Plan / UC Course  I have reviewed  the triage vital signs and the nursing notes.  Pertinent labs & imaging results that were available during my care of the patient were reviewed by me and considered in my medical decision making (see chart for details).     1.  Constipation-discussed recommendations for more frequent bowel movements, avoiding Imodium temporarily, possible correlation between constipation and shortness of breath if stool pushing up on diaphragm.  Discussed using MiraLAX and Colace and close monitoring.  Low suspicion of obstruction, without pain, passing gas, deferring imaging.  2.  Shortness of breath-possible correlation to constipation, lungs clear to auscultation, O2 97%, no significant recent cough reported, no swelling noted on exam, continue to monitor, if not improving or worsening follow-up for reevaluation  Discussed strict return precautions. Patient verbalized understanding and is agreeable with plan.  Final Clinical Impressions(s) / UC Diagnoses   Final diagnoses:  Constipation, unspecified constipation type  Shortness of breath     Discharge Instructions     Please use Miralax (17 g daily) for moderate to severe constipation. Take this once a day for the next 2-3 days. Please also start docusate stool softener, twice a day for at least 1 week. If stools become loose, cut down to once a day for another week. If stools remain loose, cut back to 1 pill every other day for a third week. You can stop docusate thereafter and resume as needed for constipation.  To help reduce constipation and promote bowel health: 1. Drink at least 64 ounces of water each day 2. Eat plenty of fiber (fruits, vegetables, whole grains, legumes) 3. For active constipation use a stool softener (docusate) or an osmotic laxative (like Miralax) each day, or as needed.   ED Prescriptions    Medication Sig Dispense Auth. Provider    polyethylene glycol (MIRALAX / GLYCOLAX) 17 g packet Take 17 g by mouth daily. 30 each Niclas Markell C, PA-C   docusate sodium (COLACE) 100 MG capsule Take 1 capsule (100 mg total) by mouth every 12 (twelve) hours. 60 capsule Syreeta Figler, [place] C, PA-C     PDMP not reviewed this encounter.   Janith Lima, PA-C 05/04/20 1305

## 2020-05-04 NOTE — Discharge Instructions (Addendum)
Please use Miralax (17 g daily) for moderate to severe constipation. Take this once a day for the next 2-3 days. Please also start docusate stool softener, twice a day for at least 1 week. If stools become loose, cut down to once a day for another week. If stools remain loose, cut back to 1 pill every other day for a third week. You can stop docusate thereafter and resume as needed for constipation.  To help reduce constipation and promote bowel health: 1. Drink at least 64 ounces of water each day 2. Eat plenty of fiber (fruits, vegetables, whole grains, legumes) 3. For active constipation use a stool softener (docusate) or an osmotic laxative (like Miralax) each day, or as needed.

## 2020-05-04 NOTE — ED Triage Notes (Signed)
Pt here for SOB and abd distention; per caregiver pt with less frequent BM; pt sts saw cardiology last week and they heard wheezing and wanted him to see pulmonologist

## 2020-05-07 ENCOUNTER — Inpatient Hospital Stay (HOSPITAL_COMMUNITY)
Admission: RE | Admit: 2020-05-07 | Discharge: 2020-05-07 | Disposition: A | Discharge status: Home / Self Care | Payer: Medicare Other | Source: Ambulatory Visit | Attending: Internal Medicine | Admitting: Internal Medicine

## 2020-05-09 ENCOUNTER — Ambulatory Visit: Payer: Medicare Other | Admitting: Gastroenterology

## 2020-05-09 ENCOUNTER — Telehealth: Payer: Self-pay

## 2020-05-09 NOTE — Telephone Encounter (Signed)
Left voicemail for patient to bring SD from CPAP for visit tomorrow. Nothing further needed.

## 2020-05-10 ENCOUNTER — Other Ambulatory Visit: Payer: Self-pay

## 2020-05-10 ENCOUNTER — Ambulatory Visit (INDEPENDENT_AMBULATORY_CARE_PROVIDER_SITE_OTHER): Payer: Medicare Other | Admitting: Pulmonary Disease

## 2020-05-10 ENCOUNTER — Encounter: Payer: Self-pay | Admitting: Pulmonary Disease

## 2020-05-10 VITALS — BP 124/84 | HR 133 | Temp 97.8°F

## 2020-05-10 DIAGNOSIS — Z9989 Dependence on other enabling machines and devices: Secondary | ICD-10-CM

## 2020-05-10 DIAGNOSIS — R0602 Shortness of breath: Secondary | ICD-10-CM | POA: Diagnosis not present

## 2020-05-10 DIAGNOSIS — G4733 Obstructive sleep apnea (adult) (pediatric): Secondary | ICD-10-CM

## 2020-05-10 NOTE — Progress Notes (Signed)
[name]  Chief Complaint  Patient presents with  . Follow-up    Does not have SD card. Needs to replace his mask.     Constitutional:  BP 124/84 (BP Location: Left Arm, Cuff Size: Large)   Pulse (!) 133   Temp 97.8 F (36.6 C)   SpO2 98%   Past Medical History:  Anxiety, Cerebral palsy, Diastolic CHF, DM, Allergies, Esophageal stricture, HTN, Pneumonia, Quadriplegia  Past Surgical History:  He  has a past surgical history that includes Tendon release; Mouth surgery; Esophagus surgery; legs; Esophagogastroduodenoscopy (N/A, 12/21/2012); Botox injection (N/A, 12/21/2012); Esophagogastroduodenoscopy (N/A, 06/28/2014); Botox injection (N/A, 06/28/2014); and Esophagogastroduodenoscopy (egd) with propofol (N/A, 09/03/2017).  Brief Summary:  Thomas Ramos is a 50 y.o. male with obstructive sleep apnea.      Subjective:   He is here with his caregiver.  He had home sleep study in august 2021.  Severe OSA.  Has been using CPAP.  Current mask works better; full face mask.  Got better service from [facility].  He doesn't have SD card for his machine.  He was seen by cardiology recently for dyspnea.  Was on medication for tachycardia, but caused fatigue and stopped.  His heart rate is fast again.  He has echocardiogram scheduled.  Physical Exam:   Appearance - in wheelchair  ENMT - no sinus tenderness, no oral exudate, no LAN, Mallampati 3 airway, no stridor  Respiratory - equal breath sounds bilaterally, no wheezing or rales  CV - s1s2 regular tachycardic, no murmurs  Ext - no clubbing, no edema  Skin - no rashes  Psych - normal mood and affect   Pulmonary testing:    Chest Imaging:   CT angio chest 08/15/16 >> large hiatal hernia  Sleep Tests:   PSG 11/08/15 >> AHI 28.4, SpO2 low 61%  Auto CPAP 02/03/17 to 05/03/17 >>used on 90 of 90 nights with average 7 hrs 13 min. Average AHI 1.7 with median CPAP 8 and 95 th percentile CPAP  12 cm H2O  HST 10/23/19 >> AHI 36.8, SpO2 low 81%  Cardiac Tests:   Echo 03/15/17 >> EF 65 to 70%  Social History:  He  reports that he has never smoked. He has quit using smokeless tobacco. He reports current alcohol use. He reports that he does not use drugs.  Family History:  His family history includes Diabetes in his mother; Hypertension in his father; Lung cancer in his father.     Assessment/Plan:   Obstructive sleep apnea. - he reports compliance with therapy and benefit from CPAP  - He was using [facility] for his DME, but would like to switch to [facility] - will get copy of his CPAP download  Dyspnea with tachycardia. - he will follow up with Dr. Cherlynn Kaiser with [facility] - if his cardiac assessment is unrevealing, then I advised him to call to determine if he needs further pulmonary assessment  Time Spent Involved in Patient Care on Day of Examination:  22 minutes  Follow up:  Patient Instructions  Will call with results of CPAP report  Will see if you can change to [facility] for CPAP supplies  Follow up in 1 year   Medication List:   Allergies as of 05/10/2020      Reactions   Penicillins Hives, Nausea And Vomiting, Other (See Comments)   Patient tolerated cefazolin in 2017 Has patient had a PCN reaction causing immediate rash, facial/tongue/throat swelling, SOB or  lightheadedness with hypotension: Yes Has patient had a PCN reaction causing severe rash involving mucus membranes or skin necrosis: No Has patient had a PCN reaction that required hospitalization No Has patient had a PCN reaction occurring within the last 10 years: Yes If all of the above answers are "NO", then may proceed with Cephalosporin use.   Sulfamethoxazole-trimethoprim Nausea And Vomiting   Metronidazole Nausea And Vomiting      Medication List       Accurate as of May 10, 2020  1:59 PM. If you have any questions, ask your nurse or doctor.        amLODipine 10 MG  tablet Commonly known as: NORVASC Take 10 mg by mouth daily.   aspirin 81 MG chewable tablet Chew by mouth daily.   baclofen 10 MG tablet Commonly known as: LIORESAL Take 5 mg by mouth at bedtime.   budesonide-formoterol 80-4.5 MCG/ACT inhaler Commonly known as: SYMBICORT Inhale 2 puffs into the lungs 2 (two) times daily.   [facility] Clotrimazole 1 % cream Generic drug: clotrimazole Apply 1 application topically 2 (two) times daily as needed (rash). BETWEEN TOES   docusate sodium 100 MG capsule Commonly known as: COLACE Take 1 capsule (100 mg total) by mouth every 12 (twelve) hours.   Dulaglutide 0.75 MG/0.5ML Sopn Inject into the skin.   furosemide 20 MG tablet Commonly known as: LASIX Take 20 mg by mouth daily.   Jardiance 10 MG Tabs tablet Generic drug: empagliflozin Take 10 mg by mouth daily.   latanoprost 0.005 % ophthalmic solution Commonly known as: XALATAN Place 1 drop into both eyes daily as needed (dry eyes).   metFORMIN 500 MG 24 hr tablet Commonly known as: GLUCOPHAGE-XR Take 250 mg by mouth at bedtime.   montelukast 10 MG tablet Commonly known as: SINGULAIR Take 10 mg by mouth daily.   mupirocin ointment 2 % Commonly known as: BACTROBAN Apply 1 application topically daily as needed (rash).   naproxen sodium 220 MG tablet Commonly known as: ALEVE Take 220-440 mg by mouth 2 (two) times daily as needed (for pain).   nitroGLYCERIN 0.4 MG SL tablet Commonly known as: NITROSTAT Place 0.4 mg under the tongue every 5 (five) minutes as needed for chest pain.   omeprazole 20 MG capsule Commonly known as: PRILOSEC Take 1 capsule by mouth every morning.   ondansetron 4 MG disintegrating tablet Commonly known as: Zofran ODT Take 1 tablet (4 mg total) by mouth every 8 (eight) hours as needed for nausea or vomiting.   polyethylene glycol 17 g packet Commonly known as: MIRALAX / GLYCOLAX Take 17 g by mouth daily.   potassium chloride 10 MEQ  tablet Commonly known as: KLOR-CON Take 10 mEq by mouth daily.   rosuvastatin 5 MG tablet Commonly known as: CRESTOR Take 5 mg by mouth at bedtime.   valsartan 80 MG tablet Commonly known as: DIOVAN Take 80 mg by mouth daily.       Signature:  Chesley Mires, MD [place] Pager - [phone] 05/10/2020, 1:59 PM

## 2020-05-10 NOTE — Patient Instructions (Signed)
Will call with results of CPAP report  Will see if you can change to [facility] for CPAP supplies  Follow up in 1 year

## 2020-05-24 ENCOUNTER — Telehealth: Payer: Self-pay | Admitting: Pulmonary Disease

## 2020-05-24 NOTE — Telephone Encounter (Signed)
I have sent a community message to [facility]. Looks like confirmation was on the referral   . Type Date User Summary Attachment  General 05/10/2020 4:10 PM Thomas Ramos - -  Note   Confirmation received from [facility]          . Type Date User Summary Attachment  General 05/10/2020 2:21 PM Thomas Ramos - -  Note   Thomas Ramos checked with [facility] - no supplies needed at this time.  Pt just wants to switch.  Message sent to [facility].         . Type Date User Summary Attachment  Provider Comments 05/10/2020 1:54 PM Chesley Mires, MD Provider Comments -  Note   He would like to change DME to [facility] for CPAP therapy if allowed by his insurance.             Will await response from [facility]

## 2020-05-24 NOTE — Telephone Encounter (Signed)
Message received back from [place] with [employer], Michel Santee, Brielyn Bosak C, RN; [facility]; [place], Raiford Noble Hi Nari Vannatter,   When we Recieved the order to swith DME, it was noted that pt did not need anything at thas time. We are unaware of any needs that Mr Pfluger may need. If he does need something he or his caregiver will need to contact office for that request and then we can proceed from there.   Thanks and Have A Nice Weekend!!          Called spoke with patient. Let him know what Ashly stated.  Patient voiced understanding Nothing further needed at this time

## 2020-05-27 ENCOUNTER — Telehealth: Payer: Self-pay | Admitting: Pulmonary Disease

## 2020-05-27 NOTE — Telephone Encounter (Signed)
I called pt & made him aware we have already sent order to [facility] to make them aware he wants to switch to them & he just needs to let us know if he needs anything.  He states he may need a hose but he will check & if so he will call us back so we can put in an order for him.  Nothing further needed at this time

## 2020-05-27 NOTE — Telephone Encounter (Signed)
Pt called and wanted to get switched to [facility] for his cpap supplies, etc.  He stated that he called [facility] today and they told him that all the information needed to be faxed to:  [phone]  Will forward to PCC's.  Thanks

## 2020-05-28 ENCOUNTER — Telehealth: Payer: Self-pay | Admitting: Pulmonary Disease

## 2020-05-28 NOTE — Telephone Encounter (Signed)
Auto CPAP 01/02/20 to 03/31/20 >> used on 72 of 90 nights with average 9 hrs 4 min.  Average AHI 8.3 with median CPAP 6 and 95 th percentile CPAP 8 cm H2O.  Air leak noted.   Please let him know his CPAP report shows good control of sleep apnea with current settings.

## 2020-05-29 NOTE — Telephone Encounter (Signed)
Called and left message on voicemail to please return phone call to go over Cpap report. Contact number provided.

## 2020-05-30 ENCOUNTER — Telehealth: Payer: Self-pay | Admitting: Pulmonary Disease

## 2020-05-30 NOTE — Telephone Encounter (Signed)
Please see previous encounter

## 2020-05-30 NOTE — Telephone Encounter (Signed)
Called, spoke with patient and went over CPAP report results per Dr Halford Chessman with patient. All questions answered and patient expressed full understanding. Nothing further needed at this time.

## 2020-06-04 ENCOUNTER — Telehealth: Payer: Self-pay | Admitting: Pulmonary Disease

## 2020-06-04 NOTE — Telephone Encounter (Signed)
LMTCB for the pt 

## 2020-06-05 ENCOUNTER — Ambulatory Visit: Payer: Medicare Other | Admitting: Podiatry

## 2020-06-05 NOTE — Telephone Encounter (Signed)
I did not order vest.  Not sure where that order came from.  Agree, he does need to continue CPAP therapy.

## 2020-06-05 NOTE — Telephone Encounter (Signed)
Pt retuning call from office. Please advise    Call back [phone]

## 2020-06-05 NOTE — Telephone Encounter (Signed)
Called spoke with patient. He states he does not want a vest that [facility] is trying to deliver to him. I do not see in the chart anything about a vest. He does what the CPAP.  I will send to Dr. Halford Chessman as Juluis Rainier and the [facility] to see if they have anything on patient regarding a vest with [facility].  Patient would like a call back letting him know we are not ordering the vest for him if that is the case.

## 2020-06-05 NOTE — Telephone Encounter (Signed)
Call made to [facility] Dr Melford Aase is who placed the order.   Call made to patient, confirmed DOB. Made patient aware did not come from [place] it came from Dr Melford Aase however I did suggest he call Dr Melford Aase before turning it down just in case the doctor feels it would be beneficial to him. Voiced understanding. Patient aware he does still need Cpap.   Nothing further needed at this time.

## 2020-06-12 ENCOUNTER — Encounter: Payer: Self-pay | Admitting: Podiatry

## 2020-06-12 ENCOUNTER — Other Ambulatory Visit: Payer: Self-pay

## 2020-06-12 ENCOUNTER — Ambulatory Visit (INDEPENDENT_AMBULATORY_CARE_PROVIDER_SITE_OTHER): Payer: Medicare Other | Admitting: Podiatry

## 2020-06-12 DIAGNOSIS — B351 Tinea unguium: Secondary | ICD-10-CM | POA: Diagnosis not present

## 2020-06-12 DIAGNOSIS — M79675 Pain in left toe(s): Secondary | ICD-10-CM

## 2020-06-12 DIAGNOSIS — M79674 Pain in right toe(s): Secondary | ICD-10-CM

## 2020-06-12 DIAGNOSIS — E118 Type 2 diabetes mellitus with unspecified complications: Secondary | ICD-10-CM

## 2020-06-12 DIAGNOSIS — S90212A Contusion of left great toe with damage to nail, initial encounter: Secondary | ICD-10-CM

## 2020-06-12 MED ORDER — MUPIROCIN 2 % EX OINT: TOPICAL_OINTMENT | CUTANEOUS | 1 refills | Status: DC

## 2020-06-12 NOTE — Patient Instructions (Addendum)
DRESSING CHANGES [place]:   PHARMACY SHOPPING LIST: 1. Saline or Wound Cleanser for cleaning wound 2. 2 x 2 inch sterile gauze for cleaning wound 3. Prescribed Mupirocin Ointment   1. CLEANSE LEFT GREAT TOE WITH SALINE OR WOUND CLEANSER.  2. DAB DRY WITH GAUZE SPONGE.  3. APPLY A LIGHT AMOUNT OF Mupirocin Ointment TO BASE OF ULCER.  4. APPLY BAND-AID  5.  IF YOU EXPERIENCE ANY FEVER, CHILLS, NIGHTSWEATS, NAUSEA OR VOMITING, ELEVATED OR LOW BLOOD SUGARS, REPORT TO EMERGENCY ROOM.  6. IF YOU EXPERIENCE INCREASED REDNESS, PAIN, SWELLING, DISCOLORATION, ODOR, PUS, DRAINAGE OR WARMTH OF YOUR FOOT, REPORT TO EMERGENCY ROOM.

## 2020-06-12 NOTE — Progress Notes (Signed)
Subjective: Thomas Ramos is a pleasant 50 y.o. male patient seen today preventative diabetic foot care and painful thick toenails that are difficult to trim. Pain interferes with ambulation. Aggravating factors include wearing enclosed shoe gear. Pain is relieved with periodic professional debridement.   He arrives alone on today's visit. Patient states he hit his left foot on the bed a couple of weeks ago. He was scheduled to see Dr. Posey Pronto in our office for the injury, but canceled the appointment because his foot started to feel better. He states his caretaker at his group home still would like someone to look at it.   Allergies  Allergen Reactions  . Penicillins Hives, Nausea And Vomiting and Other (See Comments)    Patient tolerated cefazolin in 2017 Has patient had a PCN reaction causing immediate rash, facial/tongue/throat swelling, SOB or lightheadedness with hypotension: Yes Has patient had a PCN reaction causing severe rash involving mucus membranes or skin necrosis: No Has patient had a PCN reaction that required hospitalization No Has patient had a PCN reaction occurring within the last 10 years: Yes If all of the above answers are "NO", then may proceed with Cephalosporin use.  . Sulfamethoxazole-Trimethoprim Nausea And Vomiting  . Metronidazole Nausea And Vomiting   Objective: Physical Exam  General: Thomas Ramos is a pleasant 50 y.o.  male, in NAD. AAO x 3.   Vascular:  Neurovascular status unchanged b/l lower extremities. Capillary fill time to digits <3 seconds b/l lower extremities. Palpable pedal pulses b/l LE. Pedal hair absent. Lower extremity skin temperature gradient within normal limits. No pain with calf compression b/l. Nonpitting edema noted b/l lower extremities.  Dermatological:  Pedal skin with normal turgor, texture and tone bilaterally. No open wounds bilaterally. No interdigital macerations bilaterally. Toenails 1-5 b/l elongated, discolored, dystrophic,  thickened, crumbly with subungual debris and tenderness to dorsal palpation. There is noted onchyolysis of entire nailplate of L hallux.  There is a small amount of dark heme under the nailplate. There is damage to the nailbed with oblique superficial laceration. There is no erythema, no edema, no underlying fluctuance.  Musculoskeletal:  Flaccid lower extremities noted b/l. No pain crepitus or joint limitation noted with ROM b/l. No gross bony deformities bilaterally. Utilizes motorized chair for mobility assistance.  Neurological:  Protective sensation intact 5/5 intact bilaterally with 10g monofilament b/l.  Assessment and Plan:  1. Pain due to onychomycosis of toenails of both feet   2. Subungual hematoma of great toe of left foot, initial encounter   3. Type 2 diabetes mellitus with complication, without long-term current use of insulin ([place])    -Examined patient. -Continue diabetic foot care principles. -Toenails 1-5 b/l were debrided in length and girth with sterile nail nippers and dremel without iatrogenic bleeding.  -Subungual hematoma evacuted and remaining nailplate freed of it's remaining attachment to the digit.  Light bleeding addressed with Lumicain. Antibiotic. Triple antibiotic ointment and bandaid applied.  -Prescription written for Mupirocin Ointment. Patient is to apply to left great toe once daily for 3 weeks. Call if he has any problems. -Patient given written instructions on care of left hallux. Caretaker to call office with any problems. -Patient to report any pedal injuries to medical professional immediately. -Patient/POA to call should there be question/concern in the interim.  Return in about 3 months (around 09/12/2020).  Marzetta Board, DPM

## 2020-06-25 ENCOUNTER — Ambulatory Visit: Payer: Medicare Other | Admitting: Gastroenterology

## 2020-07-01 ENCOUNTER — Ambulatory Visit (HOSPITAL_COMMUNITY): Admission: RE | Admit: 2020-07-01 | Payer: Medicare Other | Source: Ambulatory Visit

## 2020-07-12 ENCOUNTER — Ambulatory Visit: Payer: Medicare Other | Admitting: Gastroenterology

## 2020-07-22 ENCOUNTER — Ambulatory Visit: Payer: Medicare Other | Admitting: Gastroenterology

## 2020-07-30 ENCOUNTER — Telehealth: Payer: Self-pay | Admitting: Pulmonary Disease

## 2020-07-30 DIAGNOSIS — G4733 Obstructive sleep apnea (adult) (pediatric): Secondary | ICD-10-CM

## 2020-07-30 NOTE — Telephone Encounter (Signed)
Attempted to call pt but unable to reach. Left message for him to return call.   Per other encounter from today 5/17 that was closed, Dr. Halford Chessman said to change pt's cpap settings to 6cm H20. Order was placed and per [place], [facility], the order was faxed to [facility] for pt.

## 2020-07-30 NOTE — Telephone Encounter (Signed)
Please send order to change his CPAP to 6 cm H2O.

## 2020-07-30 NOTE — Telephone Encounter (Signed)
PCC's   Pt is wanting to change from [facility] to [facility].  It looks like the request was sent in back in Feb, but he said that he called [facility] and they told him that he is still with [facility].     Pt is c/o that the pressure on his machine feels like it is blowing too much for him.  Not sure if it needs to be adjusted or not.  He stated that he does not want to be with [facility], so message has been sent to [facility] to see about getting him changed to [facility]  VS please advise. Thanks

## 2020-07-30 NOTE — Telephone Encounter (Signed)
I am working on this one.  Sending new order placed today to [facility] per pt's request. LM on VM advising that I sent this order to [place].

## 2020-07-30 NOTE — Telephone Encounter (Signed)
Order placed for the change in the CPAP.

## 2020-08-01 NOTE — Telephone Encounter (Signed)
Called and spoke to pt. Informed him of the information regarding his CPAP and pressure change. Pt verbalized understanding and denied any further questions or concerns at this time.

## 2020-08-09 ENCOUNTER — Other Ambulatory Visit: Payer: Self-pay

## 2020-08-09 ENCOUNTER — Ambulatory Visit (HOSPITAL_COMMUNITY)
Admission: RE | Admit: 2020-08-09 | Discharge: 2020-08-09 | Disposition: A | Discharge status: Home / Self Care | Payer: Medicare Other | Source: Ambulatory Visit | Attending: Internal Medicine | Admitting: Internal Medicine

## 2020-08-09 DIAGNOSIS — I5032 Chronic diastolic (congestive) heart failure: Secondary | ICD-10-CM | POA: Insufficient documentation

## 2020-08-09 DIAGNOSIS — R000 Tachycardia, unspecified: Secondary | ICD-10-CM | POA: Insufficient documentation

## 2020-08-09 DIAGNOSIS — R9431 Abnormal electrocardiogram [ECG] [EKG]: Secondary | ICD-10-CM

## 2020-08-09 DIAGNOSIS — R011 Cardiac murmur, unspecified: Secondary | ICD-10-CM | POA: Diagnosis not present

## 2020-08-09 DIAGNOSIS — I110 Hypertensive heart disease with heart failure: Secondary | ICD-10-CM | POA: Diagnosis not present

## 2020-08-09 DIAGNOSIS — G809 Cerebral palsy, unspecified: Secondary | ICD-10-CM | POA: Insufficient documentation

## 2020-08-09 LAB — ECHOCARDIOGRAM COMPLETE
Area-P 1/2: 5.13 cm2
S' Lateral: 1.7 cm

## 2020-08-09 MED ORDER — PERFLUTREN LIPID MICROSPHERE
1.0000 mL | INTRAVENOUS | Status: AC | PRN
  Filled 2020-08-09: qty 10

## 2020-08-09 NOTE — Progress Notes (Signed)
  Echocardiogram 2D Echocardiogram has been performed.  Thomas Ramos 08/09/2020, 4:28 PM

## 2020-08-15 ENCOUNTER — Telehealth: Payer: Self-pay | Admitting: Gastroenterology

## 2020-08-15 ENCOUNTER — Telehealth: Payer: Self-pay | Admitting: Internal Medicine

## 2020-08-15 NOTE — Telephone Encounter (Signed)
This RN called patient back, relayed the following information from Dr. Margaretann Loveless,   Elouise Munroe, MD  08/13/2020 12:18 PM EDT      Hyperdynamic LV function with no concerning findings on echo.    Patient verbalized understanding, all questions and concerns addressed at this time.

## 2020-08-15 NOTE — Telephone Encounter (Signed)
Noted  

## 2020-08-15 NOTE — Telephone Encounter (Signed)
° ° °  Pt is calling to get echo result °

## 2020-08-15 NOTE — Telephone Encounter (Signed)
Inbound call from pt stating that he is having a hard time swallowing while taking meds in the morning. Please advise

## 2020-08-15 NOTE — Telephone Encounter (Signed)
Spoke with patient, he states that he noticed this morning that when he went to take his medicine it was hard for him to swallow. Pt reports that he is onTrulicity and has been on it for months but thinks that maybe that could be causing his throat to swell. He states that he saw on TV that Trulicity can cause your throat to swell, pt states that he reported this to his PCP also and they do not think it is related. No difficulty with swallowing foods or liquids. Pt states that he may need esophagus stretched again. Advised patient that he can try mixing the pills in applesauce and taking it that way. Offered to send this to Dr. Loletha Carrow for further recommendations prior to his appt but patient states that he will discuss this when he comes in on 6/10. Advised patient to call us if he has any worsening symptoms or has any issues with swallowing foods or liquids. Patient verbalized understanding and had no concerns at the end of the call.

## 2020-08-16 NOTE — Telephone Encounter (Signed)
Patient calling back, requesting for nurse to call.  States he is still having issues today.

## 2020-08-19 NOTE — Telephone Encounter (Signed)
Spoke with patient, he reports that over the weekend he experienced a lot of coughing. Denies any vomiting or nausea. Pt reports that he started coughing randomly, he states that he did not swallow anything the wrong way to cause this. He states that he just wanted to let you know how it went over the weekend. Advised patient that if he is having any difficulty swallowing, SOB or not tolerating anything PO then he will need to go to the ED for evaluation. Patient is wondering if he may need an EGD. Advised that Dr. Loletha Carrow may want to see him in the office prior to scheduling any procedures but will defer to Dr. Loletha Carrow. Advised that we will give him a call back with any further recommendations. Patient verbalized understanding and had no other concerns. Please advise, thanks.

## 2020-08-19 NOTE — Telephone Encounter (Signed)
I will discuss it with him at this week's office visit

## 2020-08-20 NOTE — Telephone Encounter (Signed)
Noted  

## 2020-08-23 ENCOUNTER — Ambulatory Visit (INDEPENDENT_AMBULATORY_CARE_PROVIDER_SITE_OTHER): Payer: Medicare Other | Admitting: Gastroenterology

## 2020-08-23 ENCOUNTER — Encounter: Payer: Self-pay | Admitting: Gastroenterology

## 2020-08-23 VITALS — BP 130/74 | HR 124

## 2020-08-23 DIAGNOSIS — R1319 Other dysphagia: Secondary | ICD-10-CM

## 2020-08-23 DIAGNOSIS — K449 Diaphragmatic hernia without obstruction or gangrene: Secondary | ICD-10-CM

## 2020-08-23 DIAGNOSIS — Z1211 Encounter for screening for malignant neoplasm of colon: Secondary | ICD-10-CM

## 2020-08-23 DIAGNOSIS — K5909 Other constipation: Secondary | ICD-10-CM | POA: Diagnosis not present

## 2020-08-23 NOTE — Patient Instructions (Signed)
If you are age 48 or older, your body mass index should be between 23-30. Your There is no height or weight on file to calculate BMI. If this is out of the aforementioned range listed, please consider follow up with your Primary Care Provider.  If you are age 42 or younger, your body mass index should be between 19-25. Your There is no height or weight on file to calculate BMI. If this is out of the aformentioned range listed, please consider follow up with your Primary Care Provider.   __________________________________________________________  The [facility] providers would like to encourage you to use [facility] to communicate with providers for non-urgent requests or questions.  Due to long hold times on the telephone, sending your provider a message by [facility] may be a faster and more efficient way to get a response.  Please allow 48 business hours for a response.  Please remember that this is for non-urgent requests.   Your provider has ordered Cologuard testing as an option for colon cancer screening. This is performed by [employer] and may be out of network with your insurance. PRIOR to completing the test, it is YOUR responsibility to contact your insurance about covered benefits for this test. Your out of pocket expense could be anywhere from $0.00 to $649.00.   When you call to check coverage with your insurer, please provide the following information:   -The ONLY provider of Cologuard is [place] code for Cologuard is [phone].  [employer] NPI # [id number]  -[employer] Tax ID # [id number]   We have already sent your demographic and insurance information to [employer] (phone number [phone]) and they should contact you within the next week regarding your test. If you have not heard from them within the next week, please call our office at [phone].   It was a pleasure to see you today!  Thank you for trusting me  with your gastrointestinal care!

## 2020-08-23 NOTE — Progress Notes (Signed)
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  It has continued to be an issue, and he still wants to take Imodium to maybe have a bowel movement once or twice a week.  He is hoping for a "happy medium" that has not been achievable given his underlying tendency to chronic constipation from his CP and then the diarrhea caused by medicines.  Primary care note indicates other medicines for diabetes have not agree with him in 1 where the other.  He was referred back to [place] by primary care for screening colonoscopy. His caregiver was with him today.  Keino has chronic dysphagia from a large hiatal hernia with distal esophageal tortuosity, and says that he is doing well with that.  Last upper endoscopy I did did not find E GJ outflow obstruction amenable to dilation or Botox injection."  Last EGD June 2019  Aleksi has had longstanding dysphagia from his hiatal hernia and probable dysmotility.  He had Botox by Dr. Deatra Ina in 2014.  At the time of my last endoscopy the scope passed through the EG junction easily and this did not appear to be achalasia, but rather severe distal esophageal tortuosity from his hiatal hernia, complicated by his contractions and body habitus due to CP. We have also had multiple discussions related to his chronic constipation but then intermittent loose stool that I think is probably from metformin.  He has had no prior colon cancer screening, though I did recommend a Cologuard at a previous visit.  He contacted our office last week complaining of difficulty swallowing some food and pills and that he was frequently coughing.  He has previously been admitted for aspiration suspected to be from his esophageal problems. _______________________________   Eulas Post told me that he wanted to talk with me without any of his other caregivers  present so they would not interrupt him.  He says he still has constipation but has learned to live with it.  He mostly wanted to discuss his swallowing problem and whether he should have another endoscopy.  He still has difficulty getting pills down and sometimes food and he coughs.  Only a minute or so into the discussion he then said he had to cut it short because he did not want to miss his transport bus ride home. He also told me that he lost the paperwork for the Cologuard but wants to do it again.  He agrees that a colonoscopy would be a challenging undertaking for him, but he would be willing to do so if necessary, understanding that he would require hospital admission. ROS: Cardiovascular:  no chest pain Respiratory: no dyspnea,+ intermittent cough as noted above   The patient's Past Medical, Family and Social History were reviewed and are on file in the EMR.  Objective:  Med list reviewed  Current Outpatient Medications:    Albuterol Sulfate (PROAIR RESPICLICK) 301 (90 Base) MCG/ACT AEPB, Inhale into the lungs., Disp: , Rfl:    amLODipine (NORVASC) 10 MG tablet, Take 10 mg by mouth daily., Disp: , Rfl:    aspirin 81 MG chewable tablet, Chew by mouth daily., Disp: , Rfl:    baclofen (LIORESAL) 10 MG tablet, Take 5 mg by mouth at bedtime. , Disp: , Rfl:    budesonide-formoterol (SYMBICORT) 80-4.5 MCG/ACT inhaler, Inhale 2 puffs into the lungs  2 (two) times daily., Disp: , Rfl:    [employer] CLOTRIMAZOLE 1 % cream, Apply 1 application topically 2 (two) times daily as needed (rash). BETWEEN TOES, Disp: , Rfl: 1   docusate sodium (COLACE) 100 MG capsule, Take 1 capsule (100 mg total) by mouth every 12 (twelve) hours., Disp: 60 capsule, Rfl: 0   Dulaglutide 0.75 MG/0.5ML SOPN, Inject into the skin., Disp: , Rfl:    empagliflozin (JARDIANCE) 10 MG TABS tablet, Take 10 mg by mouth daily., Disp: , Rfl:    furosemide (LASIX) 20 MG tablet, Take 20 mg by mouth daily., Disp: , Rfl: 1   latanoprost  (XALATAN) 0.005 % ophthalmic solution, Place 1 drop into both eyes daily as needed (dry eyes). , Disp: , Rfl:    metFORMIN (GLUCOPHAGE-XR) 500 MG 24 hr tablet, Take 250 mg by mouth at bedtime. , Disp: , Rfl:    montelukast (SINGULAIR) 10 MG tablet, Take 10 mg by mouth daily. , Disp: , Rfl:    mupirocin ointment (BACTROBAN) 2 %, Apply to left great toe nailbed once daily for 3 weeks., Disp: 30 g, Rfl: 1   naproxen sodium (ALEVE) 220 MG tablet, Take 220-440 mg by mouth 2 (two) times daily as needed (for pain)., Disp: , Rfl:    nitroGLYCERIN (NITROSTAT) 0.4 MG SL tablet, Place 0.4 mg under the tongue every 5 (five) minutes as needed for chest pain. , Disp: , Rfl:    omeprazole (PRILOSEC) 20 MG capsule, Take 1 capsule by mouth every morning., Disp: , Rfl:    ondansetron (ZOFRAN ODT) 4 MG disintegrating tablet, Take 1 tablet (4 mg total) by mouth every 8 (eight) hours as needed for nausea or vomiting., Disp: 5 tablet, Rfl: 0   polyethylene glycol (MIRALAX / GLYCOLAX) 17 g packet, Take 17 g by mouth daily., Disp: 30 each, Rfl: 0   potassium chloride (K-DUR) 10 MEQ tablet, Take 10 mEq by mouth daily., Disp: , Rfl:    rosuvastatin (CRESTOR) 5 MG tablet, Take 5 mg by mouth at bedtime., Disp: , Rfl:    timolol (TIMOPTIC) 0.5 % ophthalmic solution, 1 drop 2 (two) times daily., Disp: , Rfl:    valsartan (DIOVAN) 80 MG tablet, Take 80 mg by mouth daily., Disp: , Rfl:    Vital signs in last 24 hrs: Vitals:   08/23/20 1524  BP: 130/74  Pulse: (!) 124   Wt Readings from Last 3 Encounters:  04/25/20 145 lb (65.8 kg)  01/08/20 190 lb (86.2 kg)  03/21/19 190 lb (86.2 kg)    Physical Exam  Darian is pleasant and conversational as always.  He is wheelchair-bound has severe contractures.  Somewhat dysarthric with an altered speech pattern.  Remainder of exam not performed, also limited by his body habitus.  Entire visit spent in discussion.  _________________________________ Assessment & Plan   Assessment: Encounter Diagnoses  Name Primary?   Esophageal dysphagia Yes   Hiatal hernia    Chronic constipation    Special screening for malignant neoplasms, colon    Dragon probably has an underlying esophageal dysmotility, but mostly this is a tortuous distal anatomy from a large hiatal hernia.  On last endoscopy there was nothing amenable to endoscopic dilation, and I suspect there would not be at this point either.  He has chronic constipation related to his medical condition and relative immobility, but sometimes is loose stool related to his medicine. He is young, and colon cancer screening is reasonable, though would be logistically very challenging to perform  a colonoscopy.  He would unquestionably need elective hospitalization to do the bowel preparation and then procedure, and significant help taking the bowel preparation so it is done slowly enough and carefully so that he does not aspirate. He wished to do the Cologuard, so we again sent an order for it.  With the limited time he had available because of his transportation limitations, I think we communicated well and addressed his concerns.     68minutes were spent on this encounter (including chart review, history/exam, counseling/coordination of care, and documentation) > 50% of that time was spent on counseling and coordination of care.  Topics discussed included: See above.  [name]

## 2020-09-17 ENCOUNTER — Telehealth: Payer: Self-pay | Admitting: Gastroenterology

## 2020-09-17 NOTE — Telephone Encounter (Signed)
I am aware of this issue, and Nell and I have discussed it multiple times, including at his recent office visit.  This primarily bothers him with medications, and he should be taking those with some applesauce or yogurt. He has a large hiatal hernia which also contributes to dysphagia, and we discussed how only surgical repair of that hernia with feeding tube placement would improve that part of the problem, something he did not wish to pursue.  Unfortunately, endoscopic dilation would not be helpful here, which I determined the last time I performed an upper endoscopy on Khi.  - HD

## 2020-09-17 NOTE — Telephone Encounter (Signed)
Spoke with patient in regards to recommendations. He has been advised to take medications with applesauce or yogurt. Patient verbalized understanding and had no concerns at the end of the call.

## 2020-09-17 NOTE — Telephone Encounter (Signed)
Spoke with patient, he states that he had a difficult time over the weekend taking his medications. He states that it causes him to gag, he states that he has a hernia. Patient denies feeling like there is something stuck in his throat. He states that this does not happen when eats. He was previously advised to try taking applesauce with his medication but he has not tried that yet. He states that he has had to have his esophagus stretched in the past.  He is seeking further advice, please advise. Thanks.

## 2020-09-17 NOTE — Telephone Encounter (Signed)
Patient calling to inform he is experiencing difficulty swallowing. Pt wants to know how to treat sxs.. Plz advise

## 2020-09-20 ENCOUNTER — Emergency Department (HOSPITAL_COMMUNITY): Payer: Medicare Other

## 2020-09-20 ENCOUNTER — Observation Stay (HOSPITAL_COMMUNITY)
Admission: EM | Admit: 2020-09-20 | Discharge: 2020-09-21 | Disposition: A | Discharge status: Home / Self Care | Payer: Medicare Other | Attending: Family Medicine | Admitting: Family Medicine

## 2020-09-20 ENCOUNTER — Other Ambulatory Visit: Payer: Self-pay

## 2020-09-20 DIAGNOSIS — Z8616 Personal history of COVID-19: Secondary | ICD-10-CM | POA: Insufficient documentation

## 2020-09-20 DIAGNOSIS — Z79899 Other long term (current) drug therapy: Secondary | ICD-10-CM | POA: Insufficient documentation

## 2020-09-20 DIAGNOSIS — Z7982 Long term (current) use of aspirin: Secondary | ICD-10-CM | POA: Insufficient documentation

## 2020-09-20 DIAGNOSIS — Z7984 Long term (current) use of oral hypoglycemic drugs: Secondary | ICD-10-CM | POA: Insufficient documentation

## 2020-09-20 DIAGNOSIS — R059 Cough, unspecified: Secondary | ICD-10-CM

## 2020-09-20 DIAGNOSIS — G4733 Obstructive sleep apnea (adult) (pediatric): Secondary | ICD-10-CM | POA: Diagnosis present

## 2020-09-20 DIAGNOSIS — J45909 Unspecified asthma, uncomplicated: Secondary | ICD-10-CM | POA: Insufficient documentation

## 2020-09-20 DIAGNOSIS — G809 Cerebral palsy, unspecified: Secondary | ICD-10-CM | POA: Diagnosis present

## 2020-09-20 DIAGNOSIS — E118 Type 2 diabetes mellitus with unspecified complications: Secondary | ICD-10-CM | POA: Diagnosis present

## 2020-09-20 DIAGNOSIS — I10 Essential (primary) hypertension: Secondary | ICD-10-CM | POA: Diagnosis present

## 2020-09-20 DIAGNOSIS — E119 Type 2 diabetes mellitus without complications: Secondary | ICD-10-CM | POA: Insufficient documentation

## 2020-09-20 DIAGNOSIS — U071 COVID-19: Secondary | ICD-10-CM | POA: Diagnosis not present

## 2020-09-20 DIAGNOSIS — I5032 Chronic diastolic (congestive) heart failure: Secondary | ICD-10-CM | POA: Insufficient documentation

## 2020-09-20 DIAGNOSIS — R000 Tachycardia, unspecified: Secondary | ICD-10-CM | POA: Diagnosis not present

## 2020-09-20 DIAGNOSIS — I110 Hypertensive heart disease with heart failure: Secondary | ICD-10-CM | POA: Diagnosis not present

## 2020-09-20 LAB — CBC WITH DIFFERENTIAL/PLATELET
Abs Immature Granulocytes: 0.02 10*3/uL (ref 0.00–0.07)
Basophils Absolute: 0 10*3/uL (ref 0.0–0.1)
Basophils Relative: 0 %
Eosinophils Absolute: 0 10*3/uL (ref 0.0–0.5)
Eosinophils Relative: 0 %
HCT: 46.6 % (ref 39.0–52.0)
Hemoglobin: 15.3 g/dL (ref 13.0–17.0)
Immature Granulocytes: 0 %
Lymphocytes Relative: 13 %
Lymphs Abs: 0.8 10*3/uL (ref 0.7–4.0)
MCH: 27 pg (ref 26.0–34.0)
MCHC: 32.8 g/dL (ref 30.0–36.0)
MCV: 82.3 fL (ref 80.0–100.0)
Monocytes Absolute: 0.4 10*3/uL (ref 0.1–1.0)
Monocytes Relative: 7 %
Neutro Abs: 4.8 10*3/uL (ref 1.7–7.7)
Neutrophils Relative %: 80 %
Platelets: 193 10*3/uL (ref 150–400)
RBC: 5.66 MIL/uL (ref 4.22–5.81)
RDW: 12.7 % (ref 11.5–15.5)
WBC: 6.1 10*3/uL (ref 4.0–10.5)
nRBC: 0 % (ref 0.0–0.2)

## 2020-09-20 LAB — RESP PANEL BY RT-PCR (FLU A&B, COVID) ARPGX2
Influenza A by PCR: NEGATIVE
Influenza B by PCR: NEGATIVE
SARS Coronavirus 2 by RT PCR: POSITIVE — AB

## 2020-09-20 LAB — FERRITIN: Ferritin: 25 ng/mL (ref 24–336)

## 2020-09-20 LAB — COMPREHENSIVE METABOLIC PANEL
ALT: 20 U/L (ref 0–44)
AST: 14 U/L — ABNORMAL LOW (ref 15–41)
Albumin: 4.1 g/dL (ref 3.5–5.0)
Alkaline Phosphatase: 120 U/L (ref 38–126)
Anion gap: 10 (ref 5–15)
BUN: 10 mg/dL (ref 6–20)
CO2: 26 mmol/L (ref 22–32)
Calcium: 9.1 mg/dL (ref 8.9–10.3)
Chloride: 99 mmol/L (ref 98–111)
Creatinine, Ser: 0.96 mg/dL (ref 0.61–1.24)
GFR, Estimated: >60 mL/min (ref >60)
Glucose, Bld: 114 mg/dL — ABNORMAL HIGH (ref 70–99)
Potassium: 3.6 mmol/L (ref 3.5–5.1)
Sodium: 135 mmol/L (ref 135–145)
Total Bilirubin: 0.7 mg/dL (ref 0.3–1.2)
Total Protein: 7.3 g/dL (ref 6.5–8.1)

## 2020-09-20 LAB — TRIGLYCERIDES: Triglycerides: 86 mg/dL (ref <150)

## 2020-09-20 LAB — LACTIC ACID, PLASMA: Lactic Acid, Venous: 2.3 mmol/L (ref 0.5–1.9)

## 2020-09-20 LAB — CBG MONITORING, ED
Glucose-Capillary: 112 mg/dL — ABNORMAL HIGH (ref 70–99)
Glucose-Capillary: 152 mg/dL — ABNORMAL HIGH (ref 70–99)

## 2020-09-20 LAB — LACTATE DEHYDROGENASE: LDH: 119 U/L (ref 98–192)

## 2020-09-20 LAB — LIPASE, BLOOD: Lipase: 29 U/L (ref 11–51)

## 2020-09-20 LAB — D-DIMER, QUANTITATIVE: D-Dimer, Quant: 0.36 ug/mL-FEU (ref 0.00–0.50)

## 2020-09-20 LAB — FIBRINOGEN: Fibrinogen: 524 mg/dL — ABNORMAL HIGH (ref 210–475)

## 2020-09-20 LAB — C-REACTIVE PROTEIN: CRP: 6.1 mg/dL — ABNORMAL HIGH (ref <1.0)

## 2020-09-20 MED ORDER — GUAIFENESIN-DM 100-10 MG/5ML PO SYRP
10.0000 mL | ORAL_SOLUTION | ORAL | Status: DC | PRN
  Administered 2020-09-21: 10 mL via ORAL
  Filled 2020-09-20: qty 10

## 2020-09-20 MED ORDER — INSULIN ASPART 100 UNIT/ML IJ SOLN
0.0000 [IU] | Freq: Three times a day (TID) | INTRAMUSCULAR | Status: DC
  Filled 2020-09-20: qty 0.09

## 2020-09-20 MED ORDER — ONDANSETRON HCL 4 MG/2ML IJ SOLN
4.0000 mg | Freq: Once | INTRAMUSCULAR | Status: AC
  Administered 2020-09-20: 4 mg via INTRAVENOUS
  Filled 2020-09-20: qty 2

## 2020-09-20 MED ORDER — BENZONATATE 100 MG PO CAPS
100.0000 mg | ORAL_CAPSULE | Freq: Two times a day (BID) | ORAL | Status: DC
  Administered 2020-09-20 – 2020-09-21 (×2): 100 mg via ORAL
  Filled 2020-09-20 (×2): qty 1

## 2020-09-20 MED ORDER — ONDANSETRON HCL 4 MG PO TABS: 4.0000 mg | ORAL_TABLET | Freq: Four times a day (QID) | ORAL | Status: DC | PRN

## 2020-09-20 MED ORDER — ONDANSETRON HCL 4 MG/2ML IJ SOLN: 4.0000 mg | Freq: Four times a day (QID) | INTRAMUSCULAR | Status: DC | PRN

## 2020-09-20 MED ORDER — IPRATROPIUM-ALBUTEROL 20-100 MCG/ACT IN AERS
1.0000 | INHALATION_SPRAY | Freq: Four times a day (QID) | RESPIRATORY_TRACT | Status: DC
  Administered 2020-09-21 (×2): 1 via RESPIRATORY_TRACT
  Filled 2020-09-20: qty 4

## 2020-09-20 MED ORDER — ACETAMINOPHEN 325 MG PO TABS: 650.0000 mg | ORAL_TABLET | Freq: Four times a day (QID) | ORAL | Status: DC | PRN

## 2020-09-20 MED ORDER — SODIUM CHLORIDE 0.9 % IV BOLUS
1000.0000 mL | Freq: Once | INTRAVENOUS | Status: AC
  Administered 2020-09-20: 1000 mL via INTRAVENOUS

## 2020-09-20 MED ORDER — ENOXAPARIN SODIUM 40 MG/0.4ML IJ SOSY
40.0000 mg | PREFILLED_SYRINGE | INTRAMUSCULAR | Status: DC
  Administered 2020-09-21: 40 mg via SUBCUTANEOUS

## 2020-09-20 MED ORDER — HYDROCOD POLST-CPM POLST ER 10-8 MG/5ML PO SUER
5.0000 mL | Freq: Two times a day (BID) | ORAL | Status: DC | PRN
  Administered 2020-09-20: 5 mL via ORAL
  Filled 2020-09-20 (×2): qty 5

## 2020-09-20 MED ORDER — INSULIN ASPART 100 UNIT/ML IJ SOLN
0.0000 [IU] | Freq: Every day | INTRAMUSCULAR | Status: DC
  Filled 2020-09-20: qty 0.05

## 2020-09-20 MED ORDER — DEXAMETHASONE 4 MG PO TABS
6.0000 mg | ORAL_TABLET | Freq: Every day | ORAL | Status: DC
  Administered 2020-09-20: 6 mg via ORAL
  Filled 2020-09-20: qty 1

## 2020-09-20 NOTE — H&P (Signed)
History and Physical  Patient Name: Thomas Ramos     WEX:[id number]    DOB: [birth year]    DOA: 09/20/2020 PCP: Chesley Noon, MD  Patient coming from: Group home  Chief Complaint: Cough    HPI: Thomas Ramos is a 50 y.o. male, with PMH of type 2 diabetes, cerebral palsy, hypertension, dyslipidemia, sinus tachycardia, anxiety, esophageal stricture, OSA who presented to the ER on 09/20/2020 with significant cough.  Patient is not the greatest historian, thus difficult to obtain thorough HPI.  Patient states for the past few days he has had a nonproductive cough that has been bothersome.  Cough comes and goes.  Nothing relieves it.  He lives at a group home and says his "boss", is sick.  He has had his full set of COVID vaccines, including booster.  He denies any dyspnea, chest pain, abdominal pain.  He is followed by GI for esophageal stricture in the past.  He says he been tolerating diet.    ED course: -Vitals on admission: Afebrile, heart rate 124, blood pressure 106/81, respiratory rate in the 20s, maintaining sats on room air -Labs on initial presentation: Sodium 135, potassium 3.6, bicarb 26, glucose 214, BUN 10, creatinine 0.93, WBC 6.5, hemoglobin 15.3, COVID-positive -Imaging obtained on admission: Chest x-ray showed no acute processes. -In the ED the patient was given IV fluids, Zofran, and the hospitalist service was contacted for further evaluation and management.     ROS: A complete and thorough 12 point review of systems obtained, negative listed in HPI.     Past Medical History:  Diagnosis Date   Anxiety    Asthma    Cellulitis 08/04/2017   Cerebral palsy (HCC)    Chronic diastolic (congestive) heart failure (HCC)    CP (cerebral palsy) (HCC)    Diabetes mellitus without complication ([place])    borderline   Drug induced constipation    Environmental allergies    takes inhalers if needed   Esophageal stricture    GERD (gastroesophageal reflux disease)     Hypertension    Motility disorder, esophageal    Pneumonia    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[place])    S/P Botox injection    approx every 4 months   Seasonal allergies     Past Surgical History:  Procedure Laterality Date   BOTOX INJECTION N/A 12/21/2012   Procedure: BOTOX INJECTION;  Surgeon: Inda Castle, MD;  Location: [facility];  Service: Endoscopy;  Laterality: N/A;   BOTOX INJECTION N/A 06/28/2014   Procedure: BOTOX INJECTION;  Surgeon: Inda Castle, MD;  Location: [facility];  Service: Endoscopy;  Laterality: N/A;   ESOPHAGOGASTRODUODENOSCOPY N/A 12/21/2012   Procedure: ESOPHAGOGASTRODUODENOSCOPY (EGD);  Surgeon: Inda Castle, MD;  Location: [facility];  Service: Endoscopy;  Laterality: N/A;   ESOPHAGOGASTRODUODENOSCOPY N/A 06/28/2014   Procedure: ESOPHAGOGASTRODUODENOSCOPY (EGD);  Surgeon: Inda Castle, MD;  Location: [facility];  Service: Endoscopy;  Laterality: N/A;   ESOPHAGOGASTRODUODENOSCOPY (EGD) WITH PROPOFOL N/A 09/03/2017   Procedure: ESOPHAGOGASTRODUODENOSCOPY (EGD) WITH PROPOFOL;  Surgeon: Doran Stabler, MD;  Location: [facility];  Service: Gastroenterology;  Laterality: N/A;   ESOPHAGUS SURGERY     stretched esophagus   legs     MOUTH SURGERY     TENDON RELEASE      Social History: Patient lives at a group home.  The patient walks wheelchair. Non Smoker.  Allergies  Allergen Reactions   Penicillins Hives, Nausea And Vomiting and Other (See Comments)  Patient tolerated cefazolin in 2017 Has patient had a PCN reaction causing immediate rash, facial/tongue/throat swelling, SOB or lightheadedness with hypotension: Yes Has patient had a PCN reaction causing severe rash involving mucus membranes or skin necrosis: No Has patient had a PCN reaction that required hospitalization No Has patient had a PCN reaction occurring within the last 10 years: Yes If all of the above answers are "NO", then may proceed with Cephalosporin use.    Sulfamethoxazole-Trimethoprim Nausea And Vomiting   Metronidazole Nausea And Vomiting    Family history: family history includes Diabetes in his mother; Hypertension in his father; Lung cancer in his father.  Prior to Admission medications   Medication Sig Start Date End Date Taking? Authorizing Provider  Albuterol Sulfate (PROAIR RESPICLICK) 841 (90 Base) MCG/ACT AEPB Inhale into the lungs. 05/03/20   [provider]  amLODipine (NORVASC) 10 MG tablet Take 10 mg by mouth daily.    [provider]  aspirin 81 MG chewable tablet Chew by mouth daily.    [provider]  baclofen (LIORESAL) 10 MG tablet Take 5 mg by mouth at bedtime.  06/01/18   [provider]  budesonide-formoterol (SYMBICORT) 80-4.5 MCG/ACT inhaler Inhale 2 puffs into the lungs 2 (two) times daily.    [provider]  [facility] CLOTRIMAZOLE 1 % cream Apply 1 application topically 2 (two) times daily as needed (rash). BETWEEN TOES 08/19/17   [provider]  docusate sodium (COLACE) 100 MG capsule Take 1 capsule (100 mg total) by mouth every 12 (twelve) hours. 05/04/20   Wieters, Hallie C, PA-C  Dulaglutide 0.75 MG/0.5ML SOPN Inject into the skin. 02/26/20   [provider]  empagliflozin (JARDIANCE) 10 MG TABS tablet Take 10 mg by mouth daily.    [provider]  furosemide (LASIX) 20 MG tablet Take 20 mg by mouth daily. 07/15/17   [provider]  latanoprost (XALATAN) 0.005 % ophthalmic solution Place 1 drop into both eyes daily as needed (dry eyes).     [provider]  metFORMIN (GLUCOPHAGE-XR) 500 MG 24 hr tablet Take 250 mg by mouth at bedtime.  02/14/19   [provider]  montelukast (SINGULAIR) 10 MG tablet Take 10 mg by mouth daily.  08/12/16   [provider]  mupirocin ointment (BACTROBAN) 2 % Apply to left great toe nailbed once daily for 3 weeks. 06/12/20   Marzetta Board, DPM  naproxen sodium (ALEVE) 220 MG tablet Take  220-440 mg by mouth 2 (two) times daily as needed (for pain).    [provider]  nitroGLYCERIN (NITROSTAT) 0.4 MG SL tablet Place 0.4 mg under the tongue every 5 (five) minutes as needed for chest pain.     [provider]  omeprazole (PRILOSEC) 20 MG capsule Take 1 capsule by mouth every morning. 12/12/19   [provider]  ondansetron (ZOFRAN ODT) 4 MG disintegrating tablet Take 1 tablet (4 mg total) by mouth every 8 (eight) hours as needed for nausea or vomiting. 03/27/19   Petrucelli, Samantha R, PA-C  polyethylene glycol (MIRALAX / GLYCOLAX) 17 g packet Take 17 g by mouth daily. 05/04/20   Wieters, Hallie C, PA-C  potassium chloride (K-DUR) 10 MEQ tablet Take 10 mEq by mouth daily.    [provider]  rosuvastatin (CRESTOR) 5 MG tablet Take 5 mg by mouth at bedtime.    [provider]  timolol (TIMOPTIC) 0.5 % ophthalmic solution 1 drop 2 (two) times daily. 04/10/20   [provider]  valsartan (DIOVAN) 80 MG tablet Take 80 mg by mouth daily.    [provider]       Physical Exam: BP (!) 131/112   Pulse (!) 138   Temp 98.6 F (37 C) (Oral)   Resp (!) 38   Ht 5\' 6"  (1.676 m)   Wt 70 kg   SpO2 90%   BMI 24.91 kg/m   General appearance: Moderate distress secondary to coughing,  contractures secondary to cerebral palsy eyes: Anicteric, conjunctiva pink, lids and lashes normal. PERRL.    ENT: No nasal deformity, discharge, epistaxis.  Hearing intact. OP moist without lesions.   Neck: No neck masses.  Trachea midline.  No thyromegaly/tenderness. Lymph: No cervical or supraclavicular lymphadenopathy. Skin: Warm and dry.  No jaundice.  No suspicious rashes or lesions. Cardiac: Sinus tachycardia no murmurs appreciated.  No LE edema.  Radial and pedal pulses 2+ and symmetric. Respiratory: Coarse breath sounds bilaterally, no wheezing, significant coughing Abdomen: Abdomen soft.  No tenderness with palpation. No ascites,  distension, hepatosplenomegaly.   MSK: Cerebral palsy contractures noted Psych: Sensorium intact and responding to questions, attention normal.  Behavior appropriate.  Judgment and insight appear normal.    Labs on Admission:  I have personally reviewed following labs and imaging studies: CBC: Recent Labs  Lab 09/20/20 1801  WBC 6.1  NEUTROABS 4.8  HGB 15.3  HCT 46.6  MCV 82.3  PLT 962   Basic Metabolic Panel: Recent Labs  Lab 09/20/20 1801  NA 135  K 3.6  CL 99  CO2 26  GLUCOSE 114*  BUN 10  CREATININE 0.96  CALCIUM 9.1   GFR: Estimated Creatinine Clearance: 84.9 mL/min (by C-G formula based on SCr of 0.96 mg/dL).  Liver Function Tests: Recent Labs  Lab 09/20/20 1801  AST 14*  ALT 20  ALKPHOS 120  BILITOT 0.7  PROT 7.3  ALBUMIN 4.1   Recent Labs  Lab 09/20/20 1801  LIPASE 29   No results for input(s): AMMONIA in the last 168 hours. Coagulation Profile: No results for input(s): INR, PROTIME in the last 168 hours. Cardiac Enzymes: No results for input(s): CKTOTAL, CKMB, CKMBINDEX, TROPONINI in the last 168 hours. BNP (last 3 results) No results for input(s): PROBNP in the last 8760 hours. HbA1C: No results for input(s): HGBA1C in the last 72 hours. CBG: Recent Labs  Lab 09/20/20 1815  GLUCAP 112*   Lipid Profile: No results for input(s): CHOL, HDL, LDLCALC, TRIG, CHOLHDL, LDLDIRECT in the last 72 hours. Thyroid Function Tests: No results for input(s): TSH, T4TOTAL, FREET4, T3FREE, THYROIDAB in the last 72 hours. Anemia Panel: No results for input(s): VITAMINB12, FOLATE, FERRITIN, TIBC, IRON, RETICCTPCT in the last 72 hours.   Recent Results (from the past 240 hour(s))  Resp Panel by RT-PCR (Flu A&B, Covid) Nasopharyngeal Swab     Status: Abnormal   Collection Time: 09/20/20  6:02 PM   Specimen: Nasopharyngeal Swab; Nasopharyngeal(NP) swabs in vial transport medium  Result Value Ref Range Status   SARS Coronavirus 2 by RT PCR POSITIVE (A)  NEGATIVE Final    Comment: RESULT CALLED TO, READ BACK BY AND VERIFIED WITH: BANNO,A RN @2138  ON 09/20/20 JACKSON,K (NOTE) SARS-CoV-2 target nucleic acids are DETECTED.  The SARS-CoV-2 RNA is generally detectable in upper respiratory specimens during the acute phase of infection. Positive results are indicative of the presence of the identified virus, but do not rule out bacterial infection or co-infection with other pathogens not detected by the test. Clinical  correlation with patient history and other diagnostic information is necessary to determine patient infection status. The expected result is Negative.  Fact Sheet for Patients: EntrepreneurPulse.com.au  Fact Sheet for Healthcare Providers: IncredibleEmployment.be  This test is not yet approved or cleared by the [place] FDA and  has been authorized for detection and/or diagnosis of SARS-CoV-2 by FDA under an Emergency Use Authorization (EUA).  This EUA will remain in effect (meaning this test can  be used) for the duration of  the COVID-19 declaration under Section 564(b)(1) of the Act, 21 U.S.C. section 360bbb-3(b)(1), unless the authorization is terminated or revoked sooner.     Influenza A by PCR NEGATIVE NEGATIVE Final   Influenza B by PCR NEGATIVE NEGATIVE Final    Comment: (NOTE) The Xpert Xpress SARS-CoV-2/FLU/RSV plus assay is intended as an aid in the diagnosis of influenza from Nasopharyngeal swab specimens and should not be used as a sole basis for treatment. Nasal washings and aspirates are unacceptable for Xpert Xpress SARS-CoV-2/FLU/RSV testing.  Fact Sheet for Patients: EntrepreneurPulse.com.au  Fact Sheet for Healthcare Providers: IncredibleEmployment.be  This test is not yet approved or cleared by the [place] FDA and has been authorized for detection and/or diagnosis of SARS-CoV-2 by FDA under an Emergency Use  Authorization (EUA). This EUA will remain in effect (meaning this test can be used) for the duration of the COVID-19 declaration under Section 564(b)(1) of the Act, 21 U.S.C. section 360bbb-3(b)(1), unless the authorization is terminated or revoked.  Performed at [facility], [place] [address] [place], [place] [address]            Radiological Exams on Admission: Personally reviewed imaging which shows: Chest x-ray showed no acute processes. DG Chest Portable 1 View  Result Date: 09/20/2020 CLINICAL DATA:  Cough EXAM: PORTABLE CHEST 1 VIEW COMPARISON:  03/21/2019 FINDINGS: Low lung volumes with elevated right diaphragm. No consolidation or effusion. Normal heart size. Hiatal hernia. IMPRESSION: No active disease. Low lung volumes with hiatal hernia and elevated right diaphragm Electronically Signed   By: Donavan Foil M.D.   On: 09/20/2020 19:09    EKG: Independently reviewed.  Sinus tachycardia       Assessment/Plan   1.  COVID-19 -Main symptoms currently are cough and tachycardia -Chest x-ray showed no acute processes. -Currently maintaining sats on room air - Scheduled and as needed antitussives -Scheduled breathing treatments - Started on Decadron - Holding off on antivirals given no oxygen requirement  2.  Significant cough -See further plans above  3.  Sinus tachycardia -History of sinus tachycardia, followed by cardiology.  Previously on metoprolol but appears to have stopped it due to fatigue - Telemetry - Unsure how much of this tachycardia is related to underlying/chronic disease versus COVID - Consider restarting metoprolol  4.  OSA -Continue CPAP at night  5.  Cerebral palsy -Resume home medications once reconciled  6.  Essential hypertension -We will hold home BP meds for now given blood pressure soft -Monitor closely   7.  Type 2 diabetes -Hold home oral regiment - Glucose checks, sliding scale - Hemoglobin A1c  ordered    DVT prophylaxis: Lovenox Code Status: Full Family Communication: None Disposition Plan: Anticipate discharge to group home when medically optimized Consults called: None Admission status: Observation with telemetry   At the point of initial evaluation, it is my clinical opinion that admission for OBSERVATION is reasonable and necessary because the patient's presenting complaints in the context of their chronic conditions represent sufficient  risk of deterioration or significant morbidity to constitute reasonable grounds for close observation in the hospital setting, but that the patient may be medically stable for discharge from the hospital within 24 to 48 hours.    Medical decision making: Patient seen at 10:26 PM on 09/20/2020.  The patient was discussed with ER provider.  What exists of the patient's chart was reviewed in depth and summarized above.  Clinical condition: Fair.        Doran Heater [facility] Please page though [place] or Epic secure chat:  For password, contact charge nurse

## 2020-09-20 NOTE — ED Triage Notes (Signed)
Pt presents to ED via ems cc cough, nausea.

## 2020-09-20 NOTE — ED Provider Notes (Signed)
[facility] DEPT Provider Note   CSN: [id number] Arrival date & time: 09/20/20  1708     History Chief Complaint  Patient presents with   Cough    Thomas OGAN is a 50 y.o. male.  The history is provided by the patient.  Cough Cough characteristics:  Non-productive Sputum characteristics:  Nondescript Severity:  Moderate Onset quality:  Gradual Duration:  2 days Timing:  Intermittent Progression:  Waxing and waning Chronicity:  New Context: not upper respiratory infection   Relieved by:  Nothing Worsened by:  Nothing Associated symptoms: no chest pain, no chills, no ear fullness, no ear pain, no eye discharge, no fever, no rash, no shortness of breath and no sore throat   Associated symptoms comment:  Difficulty swallowing at times     Past Medical History:  Diagnosis Date   Anxiety    Asthma    Cellulitis 08/04/2017   Cerebral palsy (HCC)    Chronic diastolic (congestive) heart failure (HCC)    CP (cerebral palsy) (HCC)    Diabetes mellitus without complication ([place])    borderline   Drug induced constipation    Environmental allergies    takes inhalers if needed   Esophageal stricture    GERD (gastroesophageal reflux disease)    Hypertension    Motility disorder, esophageal    Pneumonia    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[place])    S/P Botox injection    approx every 4 months   Seasonal allergies     Patient Active Problem List   Diagnosis Date Noted   Heart murmur 02/26/2020   Pneumonia due to COVID-19 virus 03/21/2019   COVID-19 03/21/2019   Hiatal hernia    Cellulitis of right lower extremity without foot 08/03/2017   Type 2 diabetes mellitus with complication, without long-term current use of insulin ([place]) 08/03/2017   PNA (pneumonia) 05/07/2017   SIRS (systemic inflammatory response syndrome) (HCC)    Acute respiratory failure with hypoxia (HCC) 03/13/2017   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[place])    Rhinovirus  infection 08/17/2016   Pressure injury of skin 08/16/2016   Acute respiratory distress 08/15/2016   Tachycardia 08/15/2016   Sepsis ([place]) 08/15/2016   Onychomycosis 02/20/2016   Essential hypertension    OSA (obstructive sleep apnea) 12/11/2015   Chronic diastolic (congestive) heart failure (HCC)    Chronic venous insufficiency    Depression with anxiety 09/20/2015   CAP (community acquired pneumonia) 09/20/2015   Hypersomnia 07/16/2015   Asthma exacerbation 07/16/2015   GERD (gastroesophageal reflux disease) 07/16/2015   Snoring 07/16/2015   Hematemesis with nausea    Fever 05/24/2014   Hyperlipidemia 02/22/2012   Dysphagia 09/16/2010   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    Past Surgical History:  Procedure Laterality Date   BOTOX INJECTION N/A 12/21/2012   Procedure: BOTOX INJECTION;  Surgeon: Inda Castle, MD;  Location: [facility];  Service: Endoscopy;  Laterality: N/A;   BOTOX INJECTION N/A 06/28/2014   Procedure: BOTOX INJECTION;  Surgeon: Inda Castle, MD;  Location: [facility];  Service: Endoscopy;  Laterality: N/A;   ESOPHAGOGASTRODUODENOSCOPY N/A 12/21/2012   Procedure: ESOPHAGOGASTRODUODENOSCOPY (EGD);  Surgeon: Inda Castle, MD;  Location: [facility];  Service: Endoscopy;  Laterality: N/A;   ESOPHAGOGASTRODUODENOSCOPY N/A 06/28/2014   Procedure: ESOPHAGOGASTRODUODENOSCOPY (EGD);  Surgeon: Inda Castle, MD;  Location: [facility];  Service: Endoscopy;  Laterality: N/A;   ESOPHAGOGASTRODUODENOSCOPY (EGD) WITH PROPOFOL N/A 09/03/2017   Procedure: ESOPHAGOGASTRODUODENOSCOPY (EGD) WITH PROPOFOL;  Surgeon: Wilfrid Lund  L III, MD;  Location: [facility];  Service: Gastroenterology;  Laterality: N/A;   ESOPHAGUS SURGERY     stretched esophagus   legs     MOUTH SURGERY     TENDON RELEASE         Family History  Problem Relation Age of Onset   Hypertension Father    Lung cancer Father    Diabetes Mother     Social History   Tobacco Use    Smoking status: Never   Smokeless tobacco: Former   Tobacco comments:    smoked one time  Vaping Use   Vaping Use: Never used  Substance Use Topics   Alcohol use: Yes    Comment: 2 beers every other Friday and Saturday   Drug use: No    Home Medications Prior to Admission medications   Medication Sig Start Date End Date Taking? Authorizing Provider  Albuterol Sulfate (PROAIR RESPICLICK) 762 (90 Base) MCG/ACT AEPB Inhale into the lungs. 05/03/20   [provider]  amLODipine (NORVASC) 10 MG tablet Take 10 mg by mouth daily.    [provider]  aspirin 81 MG chewable tablet Chew by mouth daily.    [provider]  baclofen (LIORESAL) 10 MG tablet Take 5 mg by mouth at bedtime.  06/01/18   [provider]  budesonide-formoterol (SYMBICORT) 80-4.5 MCG/ACT inhaler Inhale 2 puffs into the lungs 2 (two) times daily.    [provider]  [facility] CLOTRIMAZOLE 1 % cream Apply 1 application topically 2 (two) times daily as needed (rash). BETWEEN TOES 08/19/17   [provider]  docusate sodium (COLACE) 100 MG capsule Take 1 capsule (100 mg total) by mouth every 12 (twelve) hours. 05/04/20   Wieters, Hallie C, PA-C  Dulaglutide 0.75 MG/0.5ML SOPN Inject into the skin. 02/26/20   [provider]  empagliflozin (JARDIANCE) 10 MG TABS tablet Take 10 mg by mouth daily.    [provider]  furosemide (LASIX) 20 MG tablet Take 20 mg by mouth daily. 07/15/17   [provider]  latanoprost (XALATAN) 0.005 % ophthalmic solution Place 1 drop into both eyes daily as needed (dry eyes).     [provider]  metFORMIN (GLUCOPHAGE-XR) 500 MG 24 hr tablet Take 250 mg by mouth at bedtime.  02/14/19   [provider]  montelukast (SINGULAIR) 10 MG tablet Take 10 mg by mouth daily.  08/12/16   [provider]  mupirocin ointment (BACTROBAN) 2 % Apply to left great toe nailbed once daily for 3 weeks. 06/12/20   Marzetta Board, DPM  naproxen sodium (ALEVE) 220 MG tablet Take 220-440 mg by mouth 2 (two) times daily as needed (for pain).    [provider]  nitroGLYCERIN (NITROSTAT) 0.4 MG SL tablet Place 0.4 mg under the tongue every 5 (five) minutes as needed for chest pain.     [provider]  omeprazole (PRILOSEC) 20 MG capsule Take 1 capsule by mouth every morning. 12/12/19   [provider]  ondansetron (ZOFRAN ODT) 4 MG disintegrating tablet Take 1 tablet (4 mg total) by mouth every 8 (eight) hours as needed for nausea or vomiting. 03/27/19   Petrucelli, Samantha R, PA-C  polyethylene glycol (MIRALAX / GLYCOLAX) 17 g packet Take 17 g by mouth daily. 05/04/20   Wieters, Hallie C, PA-C  potassium chloride (K-DUR) 10 MEQ tablet Take 10 mEq by mouth daily.    [provider]  rosuvastatin (CRESTOR) 5 MG tablet Take 5  mg by mouth at bedtime.    [provider]  timolol (TIMOPTIC) 0.5 % ophthalmic solution 1 drop 2 (two) times daily. 04/10/20   [provider]  valsartan (DIOVAN) 80 MG tablet Take 80 mg by mouth daily.    [provider]    Allergies    Penicillins, Sulfamethoxazole-trimethoprim, and Metronidazole  Review of Systems   Review of Systems  Constitutional:  Negative for chills and fever.  HENT:  Negative for ear pain and sore throat.   Eyes:  Negative for pain, discharge and visual disturbance.  Respiratory:  Positive for cough. Negative for shortness of breath.   Cardiovascular:  Negative for chest pain and palpitations.  Gastrointestinal:  Negative for abdominal pain and vomiting.  Genitourinary:  Negative for dysuria and hematuria.  Musculoskeletal:  Negative for arthralgias and back pain.  Skin:  Negative for color change and rash.  Neurological:  Negative for seizures and syncope.  All other systems reviewed and are negative.  Physical Exam Updated Vital Signs  ED Triage Vitals  Enc Vitals Group     BP 09/20/20 1732 106/81      Pulse Rate 09/20/20 1732 (!) 124     Resp 09/20/20 1732 18     Temp 09/20/20 1732 98.6 F (37 C)     Temp Source 09/20/20 1732 Oral     SpO2 09/20/20 1732 98 %     Weight 09/20/20 1755 154 lb 5.2 oz (70 kg)     Height 09/20/20 1755 5\' 6"  (1.676 m)     Head Circumference --      Peak Flow --      Pain Score --      Pain Loc --      Pain Edu? --      Excl. in [place]? --      Physical Exam Vitals and nursing note reviewed.  Constitutional:      General: He is not in acute distress.    Appearance: He is well-developed. He is not ill-appearing.  HENT:     Head: Normocephalic and atraumatic.     Nose: Nose normal.     Mouth/Throat:     Mouth: Mucous membranes are moist.  Eyes:     Extraocular Movements: Extraocular movements intact.     Conjunctiva/sclera: Conjunctivae normal.     Pupils: Pupils are equal, round, and reactive to light.  Cardiovascular:     Rate and Rhythm: Regular rhythm. Tachycardia present.     Heart sounds: No murmur heard. Pulmonary:     Effort: Pulmonary effort is normal. No respiratory distress.     Breath sounds: Normal breath sounds.  Abdominal:     Palpations: Abdomen is soft.     Tenderness: There is no abdominal tenderness.  Musculoskeletal:     Cervical back: Neck supple.  Skin:    General: Skin is warm and dry.  Neurological:     General: No focal deficit present.     Mental Status: He is alert.     Comments: Contractures of his upper and lower extremities    ED Results / Procedures / Treatments   Labs (all labs ordered are listed, but only abnormal results are displayed) Labs Reviewed  RESP PANEL BY RT-PCR (FLU A&B, COVID) ARPGX2 - Abnormal; Notable for the following components:      Result Value   SARS Coronavirus 2 by RT PCR POSITIVE (*)    All other components within normal limits  COMPREHENSIVE METABOLIC PANEL - Abnormal;  Notable for the following components:   Glucose, Bld 114 (*)    AST 14 (*)    All other components within  normal limits  CBG MONITORING, ED - Abnormal; Notable for the following components:   Glucose-Capillary 112 (*)    All other components within normal limits  CULTURE, BLOOD (ROUTINE X 2)  CULTURE, BLOOD (ROUTINE X 2)  CBC WITH DIFFERENTIAL/PLATELET  LIPASE, BLOOD  LACTIC ACID, PLASMA  LACTIC ACID, PLASMA  D-DIMER, QUANTITATIVE  PROCALCITONIN  LACTATE DEHYDROGENASE  FERRITIN  TRIGLYCERIDES  FIBRINOGEN  C-REACTIVE PROTEIN    EKG EKG Interpretation  Date/Time:  Friday September 20 2020 18:20:43 EDT Ventricular Rate:  120 PR Interval:  144 QRS Duration: 90 QT Interval:  309 QTC Calculation: 437 R Axis:   138 Text Interpretation: Sinus tachycardia Right axis deviation Borderline T abnormalities, anterior leads Confirmed by Lennice Sites ([id number]) on 09/20/2020 6:29:00 PM  Radiology DG Chest Portable 1 View  Result Date: 09/20/2020 CLINICAL DATA:  Cough EXAM: PORTABLE CHEST 1 VIEW COMPARISON:  03/21/2019 FINDINGS: Low lung volumes with elevated right diaphragm. No consolidation or effusion. Normal heart size. Hiatal hernia. IMPRESSION: No active disease. Low lung volumes with hiatal hernia and elevated right diaphragm Electronically Signed   By: Donavan Foil M.D.   On: 09/20/2020 19:09    Procedures Procedures   Medications Ordered in ED Medications  sodium chloride 0.9 % bolus 1,000 mL (1,000 mLs Intravenous New Bag/Given 09/20/20 1819)  ondansetron (ZOFRAN) injection 4 mg (4 mg Intravenous Given 09/20/20 1820)    ED Course  I have reviewed the triage vital signs and the nursing notes.  Pertinent labs & imaging results that were available during my care of the patient were reviewed by me and considered in my medical decision making (see chart for details).    MDM Rules/Calculators/A&P                          SUTTON HIRSCH is a 50 year old male with history of cerebral palsy, heart failure who presents the ED with cough.  Patient with overall unremarkable vitals except for mild  tachycardia.  Has had an on and off cough for the last several days and at times in the past.  Has some difficulty with swallowing at times but denies any history of aspiration pneumonias.  He has had some nausea but denies any abdominal pain.  Overall he appears well but does have a productive cough on exam.  States that he does have some baseline tachycardia.  EKG shows sinus rhythm with no ischemic changes.  Overall concern for possibly an aspiration pneumonia or possibly a viral process.  Vital signs are overall reassuring and seems less likely to have sepsis.  We will get basic labs including chest x-ray and COVID test.  Will give fluid bolus.  Chest x-ray shows no signs of infection however patient is positive for COVID.  No leukocytosis, anemia, electrolyte abnormality.  He remains tachycardic and tachypneic and having some productive sputum.  Overall think that it would likely be wise for patient to stay.  He is not hypoxic however.  Will talk to medicine about admission and antiviral treatment.  This chart was dictated using voice recognition software.  Despite best efforts to proofread,  errors can occur which can change the documentation meaning.  Final Clinical Impression(s) / ED Diagnoses Final diagnoses:  [id number]    Rx / DC Orders ED Discharge Orders     None  Lennice Sites, DO 09/20/20 2155

## 2020-09-21 DIAGNOSIS — R059 Cough, unspecified: Secondary | ICD-10-CM

## 2020-09-21 DIAGNOSIS — U071 COVID-19: Secondary | ICD-10-CM | POA: Diagnosis not present

## 2020-09-21 LAB — COMPREHENSIVE METABOLIC PANEL
ALT: 15 U/L (ref 0–44)
AST: 11 U/L — ABNORMAL LOW (ref 15–41)
Albumin: 3.7 g/dL (ref 3.5–5.0)
Alkaline Phosphatase: 97 U/L (ref 38–126)
Anion gap: 8 (ref 5–15)
BUN: 14 mg/dL (ref 6–20)
CO2: 24 mmol/L (ref 22–32)
Calcium: 8.7 mg/dL — ABNORMAL LOW (ref 8.9–10.3)
Chloride: 105 mmol/L (ref 98–111)
Creatinine, Ser: 0.8 mg/dL (ref 0.61–1.24)
GFR, Estimated: >60 mL/min (ref >60)
Glucose, Bld: 124 mg/dL — ABNORMAL HIGH (ref 70–99)
Potassium: 4.1 mmol/L (ref 3.5–5.1)
Sodium: 137 mmol/L (ref 135–145)
Total Bilirubin: 0.7 mg/dL (ref 0.3–1.2)
Total Protein: 6.7 g/dL (ref 6.5–8.1)

## 2020-09-21 LAB — CBC WITH DIFFERENTIAL/PLATELET
Abs Immature Granulocytes: 0.03 10*3/uL (ref 0.00–0.07)
Basophils Absolute: 0 10*3/uL (ref 0.0–0.1)
Basophils Relative: 0 %
Eosinophils Absolute: 0 10*3/uL (ref 0.0–0.5)
Eosinophils Relative: 0 %
HCT: 41.5 % (ref 39.0–52.0)
Hemoglobin: 13.3 g/dL (ref 13.0–17.0)
Immature Granulocytes: 1 %
Lymphocytes Relative: 13 %
Lymphs Abs: 0.5 10*3/uL — ABNORMAL LOW (ref 0.7–4.0)
MCH: 26.9 pg (ref 26.0–34.0)
MCHC: 32 g/dL (ref 30.0–36.0)
MCV: 83.8 fL (ref 80.0–100.0)
Monocytes Absolute: 0.1 10*3/uL (ref 0.1–1.0)
Monocytes Relative: 3 %
Neutro Abs: 3.5 10*3/uL (ref 1.7–7.7)
Neutrophils Relative %: 83 %
Platelets: 149 10*3/uL — ABNORMAL LOW (ref 150–400)
RBC: 4.95 MIL/uL (ref 4.22–5.81)
RDW: 13 % (ref 11.5–15.5)
WBC: 4.2 10*3/uL (ref 4.0–10.5)
nRBC: 0 % (ref 0.0–0.2)

## 2020-09-21 LAB — C-REACTIVE PROTEIN: CRP: 7 mg/dL — ABNORMAL HIGH (ref <1.0)

## 2020-09-21 LAB — D-DIMER, QUANTITATIVE: D-Dimer, Quant: 0.35 ug/mL-FEU (ref 0.00–0.50)

## 2020-09-21 LAB — HIV ANTIBODY (ROUTINE TESTING W REFLEX): HIV Screen 4th Generation wRfx: NONREACTIVE

## 2020-09-21 LAB — FERRITIN: Ferritin: 27 ng/mL (ref 24–336)

## 2020-09-21 LAB — HEMOGLOBIN A1C
Hgb A1c MFr Bld: 6.6 % — ABNORMAL HIGH (ref 4.8–5.6)
Mean Plasma Glucose: 142.72 mg/dL

## 2020-09-21 LAB — LACTIC ACID, PLASMA: Lactic Acid, Venous: 1.6 mmol/L (ref 0.5–1.9)

## 2020-09-21 LAB — PHOSPHORUS: Phosphorus: 3.5 mg/dL (ref 2.5–4.6)

## 2020-09-21 LAB — CBG MONITORING, ED: Glucose-Capillary: 115 mg/dL — ABNORMAL HIGH (ref 70–99)

## 2020-09-21 LAB — PROCALCITONIN: Procalcitonin: <0.1 ng/mL

## 2020-09-21 LAB — MAGNESIUM: Magnesium: 2 mg/dL (ref 1.7–2.4)

## 2020-09-21 MED ORDER — DEXAMETHASONE 6 MG PO TABS: 6.0000 mg | ORAL_TABLET | Freq: Every day | ORAL | 0 refills | Status: AC

## 2020-09-21 MED ORDER — SODIUM CHLORIDE 0.9 % IV SOLN: INTRAVENOUS | Status: DC

## 2020-09-21 NOTE — ED Notes (Signed)
[facility] called, report given to group home.

## 2020-09-21 NOTE — Discharge Summary (Signed)
Physician Discharge Summary  Thomas Ramos HQP:[id number] DOB: 09/14/1970 DOA: 09/20/2020  PCP: Thomas Noon, MD  Admit date: 09/20/2020 Discharge date: 09/21/2020    Admitted From: Group home Disposition: Group home  Recommendations for Outpatient Follow-up:  Follow up with PCP in 1-2 weeks Please obtain BMP/CBC in one week Please follow up with your PCP on the following pending results: Unresulted Labs (From admission, onward)     Start     Ordered   09/27/20 0500  Creatinine, serum  (enoxaparin (LOVENOX)    CrCl >/= 30 ml/min)  Weekly,   R     Comments: while on enoxaparin therapy    09/20/20 2250   09/21/20 0500  HIV Antibody (routine testing w rflx)  (HIV Antibody (Routine testing w reflex) panel)  Tomorrow morning,   R        09/20/20 2250   09/21/20 0500  Phosphorus  Daily,   R      09/20/20 2250   09/21/20 0500  Magnesium  Daily,   R      09/20/20 2250   09/21/20 0500  Ferritin  Daily,   R      09/20/20 2250   09/21/20 0500  D-dimer, quantitative  Daily,   R      09/20/20 2250   09/21/20 0500  C-reactive protein  Daily,   R      09/20/20 2250   09/21/20 0500  Comprehensive metabolic panel  Daily,   R      09/20/20 2250   09/21/20 0500  CBC with Differential/Platelet  Daily,   R      09/20/20 2250   09/20/20 2151  Blood Culture (routine x 2)  BLOOD CULTURE X 2,   STAT      09/20/20 2151              Home Health: None Equipment/Devices: None  Discharge Condition: Stable CODE STATUS: Full code Diet recommendation: Cardiac  Subjective: Seen and examined.  He is feeling better.  He has minimal cough which is improving as well.  No other complaint.  No shortness of breath.  He also wants to go home  Following HPI and ED course has been copied from my colleague hospitalist Thomas Ramos H&P. HPI: Thomas Ramos is a 50 y.o. male, with PMH of type 2 diabetes, cerebral palsy, hypertension, dyslipidemia, sinus tachycardia, anxiety, esophageal stricture, OSA who  presented to the ER on 09/20/2020 with significant cough.   Patient is not the greatest historian, thus difficult to obtain thorough HPI.  Patient states for the past few days he has had a nonproductive cough that has been bothersome.  Cough comes and goes.  Nothing relieves it.  He lives at a group home and says his "boss", is sick.  He has had his full set of COVID vaccines, including booster.  He denies any dyspnea, chest pain, abdominal pain.  He is followed by GI for esophageal stricture in the past.  He says he been tolerating diet.       ED course: -Vitals on admission: Afebrile, heart rate 124, blood pressure 106/81, respiratory rate in the 20s, maintaining sats on room air -Labs on initial presentation: Sodium 135, potassium 3.6, bicarb 26, glucose 214, BUN 10, creatinine 0.93, WBC 6.5, hemoglobin 15.3, COVID-positive -Imaging obtained on admission: Chest x-ray showed no acute processes. -In the ED the patient was given IV fluids, Zofran, and the hospitalist service was contacted for further evaluation and management.  Brief/Interim Summary: Patient was admitted due to cough and being COVID-positive however patient did not have any other symptoms of COVID other than cough and he was never hypoxic.  His CRP was elevated but historically his CRP has always been elevated chronically when chart reviewed in detail.  Chest x-ray without any findings either.  He was started on dexamethasone but not on any antivirals due to essentially being asymptomatic.  He had mild tachycardia however this is also chronic issue, nonetheless, this morning his heart rate is 83.  Patient is stable so he is going to be discharged back home.  Since he has been started on dexamethasone, I will continue this to complete 10-day course.  He is hemodynamically stable.  Discharge Diagnoses:  Active Problems:   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HCC)   OSA (obstructive sleep apnea)   Essential hypertension   Tachycardia   Type  2 diabetes mellitus with complication, without long-term current use of insulin (HCC)   COVID-19   Cough    Discharge Instructions   Allergies as of 09/21/2020       Reactions   Penicillins Hives, Nausea And Vomiting, Other (See Comments)   Patient tolerated cefazolin in 2017 Has patient had a PCN reaction causing immediate rash, facial/tongue/throat swelling, SOB or lightheadedness with hypotension: Yes Has patient had a PCN reaction causing severe rash involving mucus membranes or skin necrosis: No Has patient had a PCN reaction that required hospitalization No Has patient had a PCN reaction occurring within the last 10 years: Yes If all of the above answers are "NO", then may proceed with Cephalosporin use.   Sulfamethoxazole-trimethoprim Nausea And Vomiting   Metronidazole Nausea And Vomiting        Medication List     STOP taking these medications    metFORMIN 500 MG 24 hr tablet Commonly known as: GLUCOPHAGE-XR   ondansetron 4 MG disintegrating tablet Commonly known as: Zofran ODT   polyethylene glycol 17 g packet Commonly known as: MIRALAX / GLYCOLAX       TAKE these medications    amLODipine 10 MG tablet Commonly known as: NORVASC Take 10 mg by mouth daily.   aspirin 81 MG chewable tablet Chew by mouth daily.   baclofen 10 MG tablet Commonly known as: LIORESAL Take 5 mg by mouth at bedtime.   budesonide-formoterol 80-4.5 MCG/ACT inhaler Commonly known as: SYMBICORT Inhale 2 puffs into the lungs 2 (two) times daily.   [employer] Clotrimazole 1 % cream Generic drug: clotrimazole Apply 1 application topically 2 (two) times daily as needed (rash). BETWEEN TOES   dexamethasone 6 MG tablet Commonly known as: DECADRON Take 1 tablet (6 mg total) by mouth at bedtime for 9 days.   docusate sodium 100 MG capsule Commonly known as: COLACE Take 1 capsule (100 mg total) by mouth every 12 (twelve) hours. What changed:  when to take this reasons to take this    Dulaglutide 0.75 MG/0.5ML Sopn Inject 0.75 mg into the skin once a week. Tuesdays   furosemide 20 MG tablet Commonly known as: LASIX Take 20 mg by mouth daily.   Jardiance 10 MG Tabs tablet Generic drug: empagliflozin Take 10 mg by mouth daily.   latanoprost 0.005 % ophthalmic solution Commonly known as: XALATAN Place 1 drop into both eyes daily as needed (dry eyes).   montelukast 10 MG tablet Commonly known as: SINGULAIR Take 10 mg by mouth daily.   mupirocin ointment 2 % Commonly known as: BACTROBAN Apply to left great toe nailbed  once daily for 3 weeks.   naproxen sodium 220 MG tablet Commonly known as: ALEVE Take 220-440 mg by mouth 2 (two) times daily as needed (for pain).   nitroGLYCERIN 0.4 MG SL tablet Commonly known as: NITROSTAT Place 0.4 mg under the tongue every 5 (five) minutes as needed for chest pain.   omeprazole 20 MG capsule Commonly known as: PRILOSEC Take 1 capsule by mouth every morning.   potassium chloride 10 MEQ tablet Commonly known as: KLOR-CON Take 10 mEq by mouth daily.   ProAir RespiClick 332 (90 Base) MCG/ACT Aepb Generic drug: Albuterol Sulfate Inhale 2 puffs into the lungs every 6 (six) hours.   rosuvastatin 5 MG tablet Commonly known as: CRESTOR Take 5 mg by mouth at bedtime.   timolol 0.5 % ophthalmic solution Commonly known as: TIMOPTIC 1 drop 2 (two) times daily.   valsartan 80 MG tablet Commonly known as: DIOVAN Take 80 mg by mouth daily.        Follow-up Information     Thomas Noon, MD Follow up in 1 week(s).   Specialty: Family Medicine Contact information: [place] [address] [phone]                Allergies  Allergen Reactions   Penicillins Hives, Nausea And Vomiting and Other (See Comments)    Patient tolerated cefazolin in 2017 Has patient had a PCN reaction causing immediate rash, facial/tongue/throat swelling, SOB or lightheadedness with hypotension: Yes Has  patient had a PCN reaction causing severe rash involving mucus membranes or skin necrosis: No Has patient had a PCN reaction that required hospitalization No Has patient had a PCN reaction occurring within the last 10 years: Yes If all of the above answers are "NO", then may proceed with Cephalosporin use.   Sulfamethoxazole-Trimethoprim Nausea And Vomiting   Metronidazole Nausea And Vomiting    Consultations: None   Procedures/Studies: DG Chest Portable 1 View  Result Date: 09/20/2020 CLINICAL DATA:  Cough EXAM: PORTABLE CHEST 1 VIEW COMPARISON:  03/21/2019 FINDINGS: Low lung volumes with elevated right diaphragm. No consolidation or effusion. Normal heart size. Hiatal hernia. IMPRESSION: No active disease. Low lung volumes with hiatal hernia and elevated right diaphragm Electronically Signed   By: Donavan Foil M.D.   On: 09/20/2020 19:09     Discharge Exam: Vitals:   09/21/20 0608 09/21/20 0827  BP: 131/80 115/74  Pulse: 91 84  Resp: (!) 22 18  Temp:    SpO2: 95% 97%   Vitals:   09/21/20 0327 09/21/20 0330 09/21/20 0608 09/21/20 0827  BP: 120/76 120/76 131/80 115/74  Pulse: 95 95 91 84  Resp:  (!) 24 (!) 22 18  Temp:      TempSrc:      SpO2: 100% 100% 95% 97%  Weight:      Height:        General: Pt is alert, awake, not in acute distress Cardiovascular: RRR, S1/S2 +, no rubs, no gallops Respiratory: CTA bilaterally, no wheezing, no rhonchi Abdominal: Soft, NT, ND, bowel sounds + Extremities: no edema, no cyanosis    The results of significant diagnostics from this hospitalization (including imaging, microbiology, ancillary and laboratory) are listed below for reference.     Microbiology: Recent Results (from the past 240 hour(s))  Resp Panel by RT-PCR (Flu A&B, Covid) Nasopharyngeal Swab     Status: Abnormal   Collection Time: 09/20/20  6:02 PM   Specimen: Nasopharyngeal Swab; Nasopharyngeal(NP) swabs in vial transport medium  Result Value Ref Range Status    SARS Coronavirus 2 by RT PCR POSITIVE (A) NEGATIVE Final    Comment: RESULT CALLED TO, READ BACK BY AND VERIFIED WITH: BANNO,A RN @2138  ON 09/20/20 JACKSON,K (NOTE) SARS-CoV-2 target nucleic acids are DETECTED.  The SARS-CoV-2 RNA is generally detectable in upper respiratory specimens during the acute phase of infection. Positive results are indicative of the presence of the identified virus, but do not rule out bacterial infection or co-infection with other pathogens not detected by the test. Clinical correlation with patient history and other diagnostic information is necessary to determine patient infection status. The expected result is Negative.  Fact Sheet for Patients: EntrepreneurPulse.com.au  Fact Sheet for Healthcare Providers: IncredibleEmployment.be  This test is not yet approved or cleared by the [place] FDA and  has been authorized for detection and/or diagnosis of SARS-CoV-2 by FDA under an Emergency Use Authorization (EUA).  This EUA will remain in effect (meaning this test can  be used) for the duration of  the COVID-19 declaration under Section 564(b)(1) of the Act, 21 U.S.C. section 360bbb-3(b)(1), unless the authorization is terminated or revoked sooner.     Influenza A by PCR NEGATIVE NEGATIVE Final   Influenza B by PCR NEGATIVE NEGATIVE Final    Comment: (NOTE) The Xpert Xpress SARS-CoV-2/FLU/RSV plus assay is intended as an aid in the diagnosis of influenza from Nasopharyngeal swab specimens and should not be used as a sole basis for treatment. Nasal washings and aspirates are unacceptable for Xpert Xpress SARS-CoV-2/FLU/RSV testing.  Fact Sheet for Patients: EntrepreneurPulse.com.au  Fact Sheet for Healthcare Providers: IncredibleEmployment.be  This test is not yet approved or cleared by the [place] FDA and has been authorized for detection and/or diagnosis of  SARS-CoV-2 by FDA under an Emergency Use Authorization (EUA). This EUA will remain in effect (meaning this test can be used) for the duration of the COVID-19 declaration under Section 564(b)(1) of the Act, 21 U.S.C. section 360bbb-3(b)(1), unless the authorization is terminated or revoked.  Performed at [name], [place] [address], [place], [place] [address]      Labs: BNP (last 3 results) No results for input(s): BNP in the last 8760 hours. Basic Metabolic Panel: Recent Labs  Lab 09/20/20 1801 09/21/20 0548  NA 135 137  K 3.6 4.1  CL 99 105  CO2 26 24  GLUCOSE 114* 124*  BUN 10 14  CREATININE 0.96 0.80  CALCIUM 9.1 8.7*  MG  --  2.0  PHOS  --  3.5   Liver Function Tests: Recent Labs  Lab 09/20/20 1801 09/21/20 0548  AST 14* 11*  ALT 20 15  ALKPHOS 120 97  BILITOT 0.7 0.7  PROT 7.3 6.7  ALBUMIN 4.1 3.7   Recent Labs  Lab 09/20/20 1801  LIPASE 29   No results for input(s): AMMONIA in the last 168 hours. CBC: Recent Labs  Lab 09/20/20 1801 09/21/20 0548  WBC 6.1 4.2  NEUTROABS 4.8 3.5  HGB 15.3 13.3  HCT 46.6 41.5  MCV 82.3 83.8  PLT 193 149*   Cardiac Enzymes: No results for input(s): CKTOTAL, CKMB, CKMBINDEX, TROPONINI in the last 168 hours. BNP: Invalid input(s): POCBNP CBG: Recent Labs  Lab 09/20/20 1815 09/20/20 2318 09/21/20 0717  GLUCAP 112* 152* 115*   D-Dimer Recent Labs    09/20/20 2151 09/21/20 0548  DDIMER 0.36 0.35   Hgb A1c Recent Labs    09/20/20 2250  HGBA1C 6.6*   Lipid Profile Recent Labs  09/20/20 2151  TRIG 86   Thyroid function studies No results for input(s): TSH, T4TOTAL, T3FREE, THYROIDAB in the last 72 hours.  Invalid input(s): FREET3 Anemia work up Recent Labs    09/20/20 2151 09/21/20 0548  FERRITIN 25 27   Urinalysis    Component Value Date/Time   COLORURINE YELLOW 03/21/2019 2013   APPEARANCEUR CLEAR 03/21/2019 2013   LABSPEC 1.028 03/21/2019 2013   PHURINE 5.0  03/21/2019 2013   GLUCOSEU >=500 (A) 03/21/2019 2013   HGBUR NEGATIVE 03/21/2019 2013   [place] 03/21/2019 2013   KETONESUR 5 (A) 03/21/2019 2013   PROTEINUR NEGATIVE 03/21/2019 2013   UROBILINOGEN 1.0 05/26/2014 2211   NITRITE NEGATIVE 03/21/2019 2013   LEUKOCYTESUR NEGATIVE 03/21/2019 2013   Sepsis Labs Invalid input(s): PROCALCITONIN,  WBC,  LACTICIDVEN Microbiology Recent Results (from the past 240 hour(s))  Resp Panel by RT-PCR (Flu A&B, Covid) Nasopharyngeal Swab     Status: Abnormal   Collection Time: 09/20/20  6:02 PM   Specimen: Nasopharyngeal Swab; Nasopharyngeal(NP) swabs in vial transport medium  Result Value Ref Range Status   SARS Coronavirus 2 by RT PCR POSITIVE (A) NEGATIVE Final    Comment: RESULT CALLED TO, READ BACK BY AND VERIFIED WITH: BANNO,A RN @2138  ON 09/20/20 JACKSON,K (NOTE) SARS-CoV-2 target nucleic acids are DETECTED.  The SARS-CoV-2 RNA is generally detectable in upper respiratory specimens during the acute phase of infection. Positive results are indicative of the presence of the identified virus, but do not rule out bacterial infection or co-infection with other pathogens not detected by the test. Clinical correlation with patient history and other diagnostic information is necessary to determine patient infection status. The expected result is Negative.  Fact Sheet for Patients: EntrepreneurPulse.com.au  Fact Sheet for Healthcare Providers: IncredibleEmployment.be  This test is not yet approved or cleared by the [place] FDA and  has been authorized for detection and/or diagnosis of SARS-CoV-2 by FDA under an Emergency Use Authorization (EUA).  This EUA will remain in effect (meaning this test can  be used) for the duration of  the COVID-19 declaration under Section 564(b)(1) of the Act, 21 U.S.C. section 360bbb-3(b)(1), unless the authorization is terminated or revoked sooner.      Influenza A by PCR NEGATIVE NEGATIVE Final   Influenza B by PCR NEGATIVE NEGATIVE Final    Comment: (NOTE) The Xpert Xpress SARS-CoV-2/FLU/RSV plus assay is intended as an aid in the diagnosis of influenza from Nasopharyngeal swab specimens and should not be used as a sole basis for treatment. Nasal washings and aspirates are unacceptable for Xpert Xpress SARS-CoV-2/FLU/RSV testing.  Fact Sheet for Patients: EntrepreneurPulse.com.au  Fact Sheet for Healthcare Providers: IncredibleEmployment.be  This test is not yet approved or cleared by the [place] FDA and has been authorized for detection and/or diagnosis of SARS-CoV-2 by FDA under an Emergency Use Authorization (EUA). This EUA will remain in effect (meaning this test can be used) for the duration of the COVID-19 declaration under Section 564(b)(1) of the Act, 21 U.S.C. section 360bbb-3(b)(1), unless the authorization is terminated or revoked.  Performed at [facility], [place] [address], [place], [place] [address]      Time coordinating discharge: Over 30 minutes  SIGNED:   Darliss Cheney, MD  [facility] 09/21/2020, 9:13 AM  If 7PM-7AM, please contact night-coverage [email or web address]

## 2020-09-22 ENCOUNTER — Other Ambulatory Visit: Payer: Self-pay

## 2020-09-22 ENCOUNTER — Emergency Department (HOSPITAL_COMMUNITY)
Admission: EM | Admit: 2020-09-22 | Discharge: 2020-09-23 | Disposition: A | Discharge status: Home / Self Care | Payer: Medicare Other | Attending: Emergency Medicine | Admitting: Emergency Medicine

## 2020-09-22 ENCOUNTER — Encounter (HOSPITAL_COMMUNITY): Payer: Self-pay

## 2020-09-22 ENCOUNTER — Emergency Department (HOSPITAL_COMMUNITY): Payer: Medicare Other

## 2020-09-22 DIAGNOSIS — R0602 Shortness of breath: Secondary | ICD-10-CM | POA: Diagnosis present

## 2020-09-22 DIAGNOSIS — Z79899 Other long term (current) drug therapy: Secondary | ICD-10-CM | POA: Diagnosis not present

## 2020-09-22 DIAGNOSIS — I110 Hypertensive heart disease with heart failure: Secondary | ICD-10-CM | POA: Diagnosis not present

## 2020-09-22 DIAGNOSIS — Z7982 Long term (current) use of aspirin: Secondary | ICD-10-CM | POA: Insufficient documentation

## 2020-09-22 DIAGNOSIS — E119 Type 2 diabetes mellitus without complications: Secondary | ICD-10-CM | POA: Diagnosis not present

## 2020-09-22 DIAGNOSIS — Z8616 Personal history of COVID-19: Secondary | ICD-10-CM | POA: Insufficient documentation

## 2020-09-22 DIAGNOSIS — J45901 Unspecified asthma with (acute) exacerbation: Secondary | ICD-10-CM | POA: Diagnosis not present

## 2020-09-22 DIAGNOSIS — I5032 Chronic diastolic (congestive) heart failure: Secondary | ICD-10-CM | POA: Insufficient documentation

## 2020-09-22 DIAGNOSIS — Z87891 Personal history of nicotine dependence: Secondary | ICD-10-CM | POA: Diagnosis not present

## 2020-09-22 DIAGNOSIS — U071 COVID-19: Secondary | ICD-10-CM | POA: Insufficient documentation

## 2020-09-22 DIAGNOSIS — R059 Cough, unspecified: Secondary | ICD-10-CM

## 2020-09-22 LAB — CBC WITH DIFFERENTIAL/PLATELET
Abs Immature Granulocytes: 0.01 10*3/uL (ref 0.00–0.07)
Basophils Absolute: 0 10*3/uL (ref 0.0–0.1)
Basophils Relative: 0 %
Eosinophils Absolute: 0.1 10*3/uL (ref 0.0–0.5)
Eosinophils Relative: 1 %
HCT: 41.5 % (ref 39.0–52.0)
Hemoglobin: 13.6 g/dL (ref 13.0–17.0)
Immature Granulocytes: 0 %
Lymphocytes Relative: 26 %
Lymphs Abs: 1.2 10*3/uL (ref 0.7–4.0)
MCH: 27.1 pg (ref 26.0–34.0)
MCHC: 32.8 g/dL (ref 30.0–36.0)
MCV: 82.8 fL (ref 80.0–100.0)
Monocytes Absolute: 0.3 10*3/uL (ref 0.1–1.0)
Monocytes Relative: 6 %
Neutro Abs: 3.1 10*3/uL (ref 1.7–7.7)
Neutrophils Relative %: 67 %
Platelets: 170 10*3/uL (ref 150–400)
RBC: 5.01 MIL/uL (ref 4.22–5.81)
RDW: 13.2 % (ref 11.5–15.5)
WBC: 4.7 10*3/uL (ref 4.0–10.5)
nRBC: 0 % (ref 0.0–0.2)

## 2020-09-22 LAB — COMPREHENSIVE METABOLIC PANEL
ALT: 20 U/L (ref 0–44)
AST: 33 U/L (ref 15–41)
Albumin: 3.7 g/dL (ref 3.5–5.0)
Alkaline Phosphatase: 81 U/L (ref 38–126)
Anion gap: 8 (ref 5–15)
BUN: 17 mg/dL (ref 6–20)
CO2: 26 mmol/L (ref 22–32)
Calcium: 8.9 mg/dL (ref 8.9–10.3)
Chloride: 108 mmol/L (ref 98–111)
Creatinine, Ser: 0.84 mg/dL (ref 0.61–1.24)
GFR, Estimated: >60 mL/min (ref >60)
Glucose, Bld: 116 mg/dL — ABNORMAL HIGH (ref 70–99)
Potassium: 4.7 mmol/L (ref 3.5–5.1)
Sodium: 142 mmol/L (ref 135–145)
Total Bilirubin: 1.6 mg/dL — ABNORMAL HIGH (ref 0.3–1.2)
Total Protein: 6.8 g/dL (ref 6.5–8.1)

## 2020-09-22 MED ORDER — SODIUM CHLORIDE 0.9 % IV BOLUS
500.0000 mL | Freq: Once | INTRAVENOUS | Status: AC
  Administered 2020-09-22: 500 mL via INTRAVENOUS

## 2020-09-22 MED ORDER — ACETAMINOPHEN 500 MG PO TABS: 1000.0000 mg | ORAL_TABLET | Freq: Once | ORAL | Status: DC

## 2020-09-22 NOTE — ED Provider Notes (Signed)
[facility] DEPT Provider Note   CSN: [id number] Arrival date & time: 09/22/20  1644     History No chief complaint on file.   Thomas Ramos is a 50 y.o. male past medical history of cerebral palsy, diabetes, GERD, hypertension who presents for evaluation of cough, generalized body aches, fatigue and just not feeling well.  He was seen here on 09/20/2020 for evaluation of the same symptoms.  He was diagnosed with COVID-19 at that time.  He states that today he was at home and states he does not was not feeling well.  He states he had some cough, shortness of breath.  No abdominal pain, nausea/vomiting.  He is also noted some fevers at home. He has not taken any medications for his symptoms. He was admitted for his symptoms on 09/20/20 and was discharged back to his group home yesterday.    The history is provided by the patient.      Past Medical History:  Diagnosis Date   Anxiety    Asthma    Cellulitis 08/04/2017   Cerebral palsy (HCC)    Chronic diastolic (congestive) heart failure (HCC)    CP (cerebral palsy) ([place])    Diabetes mellitus without complication (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[place])    Rhinovirus infection 08/17/2016   Pressure injury of skin 08/16/2016   Acute respiratory distress 08/15/2016   Tachycardia 08/15/2016   Sepsis ([place]) 08/15/2016   Onychomycosis 02/20/2016   Essential hypertension    OSA (obstructive sleep apnea) 12/11/2015   Chronic diastolic (congestive) heart failure ([place])    Chronic venous insufficiency    Depression with anxiety 09/20/2015   CAP (community acquired pneumonia) 09/20/2015   Hypersomnia 07/16/2015   Asthma exacerbation 07/16/2015   GERD (gastroesophageal reflux disease) 07/16/2015   Snoring 07/16/2015   Hematemesis with nausea    Fever 05/24/2014   Hyperlipidemia 02/22/2012   Dysphagia 09/16/2010   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    Past Surgical History:  Procedure Laterality Date   BOTOX INJECTION N/A 12/21/2012   Procedure: BOTOX INJECTION;  Surgeon: Inda Castle, MD;  Location: [facility];  Service: Endoscopy;  Laterality: N/A;   BOTOX INJECTION N/A 06/28/2014   Procedure: BOTOX INJECTION;  Surgeon: Inda Castle, MD;  Location: [facility];  Service: Endoscopy;  Laterality: N/A;   ESOPHAGOGASTRODUODENOSCOPY N/A 12/21/2012   Procedure: ESOPHAGOGASTRODUODENOSCOPY (EGD);  Surgeon: Inda Castle, MD;  Location: [facility];  Service: Endoscopy;  Laterality: N/A;   ESOPHAGOGASTRODUODENOSCOPY N/A 06/28/2014   Procedure: ESOPHAGOGASTRODUODENOSCOPY (EGD);  Surgeon: Sandy Salaam  Deatra Ina, MD;  Location: [facility];  Service: Endoscopy;  Laterality: N/A;   ESOPHAGOGASTRODUODENOSCOPY (EGD) WITH PROPOFOL N/A 09/03/2017   Procedure: ESOPHAGOGASTRODUODENOSCOPY (EGD) WITH PROPOFOL;  Surgeon: Doran Stabler, MD;  Location: [facility];  Service: Gastroenterology;  Laterality: N/A;   ESOPHAGUS SURGERY     stretched esophagus   legs     MOUTH SURGERY     TENDON RELEASE         Family History  Problem Relation Age of Onset   Hypertension  Father    Lung cancer Father    Diabetes Mother     Social History   Tobacco Use   Smoking status: Never   Smokeless tobacco: Former   Tobacco comments:    smoked one time  Vaping Use   Vaping Use: Never used  Substance Use Topics   Alcohol use: Yes    Comment: 2 beers every other Friday and Saturday   Drug use: No    Home Medications Prior to Admission medications   Medication Sig Start Date End Date Taking? Authorizing Provider  Albuterol Sulfate (PROAIR RESPICLICK) 616 (90 Base) MCG/ACT AEPB Inhale 2 puffs into the lungs every 6 (six) hours. 05/03/20   [provider]  amLODipine (NORVASC) 10 MG tablet Take 10 mg by mouth daily.    [provider]  aspirin 81 MG chewable tablet Chew by mouth daily.    [provider]  baclofen (LIORESAL) 10 MG tablet Take 5 mg by mouth at bedtime.  06/01/18   [provider]  budesonide-formoterol (SYMBICORT) 80-4.5 MCG/ACT inhaler Inhale 2 puffs into the lungs 2 (two) times daily.    [provider]  [employer] CLOTRIMAZOLE 1 % cream Apply 1 application topically 2 (two) times daily as needed (rash). BETWEEN TOES 08/19/17   [provider]  dexamethasone (DECADRON) 6 MG tablet Take 1 tablet (6 mg total) by mouth at bedtime for 9 days. 09/21/20 09/30/20  Darliss Cheney, MD  docusate sodium (COLACE) 100 MG capsule Take 1 capsule (100 mg total) by mouth every 12 (twelve) hours. 05/04/20   Wieters, Hallie C, PA-C  Dulaglutide 0.75 MG/0.5ML SOPN Inject 0.75 mg into the skin once a week. Tuesdays 02/26/20   [provider]  empagliflozin (JARDIANCE) 10 MG TABS tablet Take 10 mg by mouth daily.    [provider]  furosemide (LASIX) 20 MG tablet Take 20 mg by mouth daily. 07/15/17   [provider]  latanoprost (XALATAN) 0.005 % ophthalmic solution Place 1 drop into both eyes daily as needed (dry eyes).     [provider]  montelukast (SINGULAIR) 10 MG tablet Take 10 mg by mouth  daily.  08/12/16   [provider]  mupirocin ointment (BACTROBAN) 2 % Apply to left great toe nailbed once daily for 3 weeks. 06/12/20   Marzetta Board, DPM  naproxen sodium (ALEVE) 220 MG tablet Take 220-440 mg by mouth 2 (two) times daily as needed (for pain).    [provider]  nitroGLYCERIN (NITROSTAT) 0.4 MG SL tablet Place 0.4 mg under the tongue every 5 (five) minutes as needed for chest pain.     [provider]  omeprazole (PRILOSEC) 20 MG capsule Take 1 capsule by mouth every morning. 12/12/19   [provider]  potassium chloride (K-DUR) 10 MEQ tablet Take 10 mEq by mouth daily.    [provider]  rosuvastatin (CRESTOR) 5 MG tablet Take 5 mg by mouth at bedtime.    [provider]  timolol (TIMOPTIC) 0.5 % ophthalmic solution 1 drop 2 (two) times daily. 04/10/20   [provider]  valsartan (DIOVAN) 80 MG tablet Take 80 mg by mouth daily.    [provider]    Allergies    Penicillins, Sulfamethoxazole-trimethoprim, and Metronidazole  Review of Systems   Review of Systems  Constitutional:  Positive for fatigue and fever.  Respiratory:  Positive for cough and shortness of breath.   Cardiovascular:  Negative for chest pain.  Gastrointestinal:  Negative for abdominal pain, nausea and vomiting.  Genitourinary:  Negative for dysuria and hematuria.  Musculoskeletal:  Positive for myalgias.  Neurological:  Negative for headaches.  All other systems reviewed and are negative.  Physical Exam Updated Vital Signs BP 123/90   Pulse 82   Temp 98.5 F (36.9 C) (Oral)   Resp 20   SpO2 96%   Physical Exam Vitals and nursing note reviewed.  Constitutional:      Appearance: Normal appearance. He is well-developed.  HENT:     Head: Normocephalic and atraumatic.  Eyes:     General: Lids are normal.     Conjunctiva/sclera: Conjunctivae normal.     Pupils: Pupils are equal, round, and reactive to light.   Cardiovascular:     Rate and Rhythm: Normal rate and regular rhythm.     Pulses: Normal pulses.     Heart sounds: Normal heart sounds. No murmur heard.   No friction rub. No gallop.  Pulmonary:     Effort: Pulmonary effort is normal.     Breath sounds: Normal breath sounds.     Comments: Lungs clear to auscultation bilaterally.  Symmetric chest rise.  No wheezing, rales, rhonchi. Abdominal:     Palpations: Abdomen is soft. Abdomen is not rigid.     Tenderness: There is no abdominal tenderness. There is no guarding.     Comments: Abdomen is soft, non-distended, non-tender. No rigidity, No guarding. No peritoneal signs.  Musculoskeletal:        General: Normal range of motion.     Cervical back: Full passive range of motion without pain.  Skin:    General: Skin is warm and dry.     Capillary Refill: Capillary refill takes less than 2 seconds.  Neurological:     Mental Status: He is alert and oriented to person, place, and time.  Psychiatric:        Speech: Speech normal.    ED Results / Procedures / Treatments   Labs (all labs ordered are listed, but only abnormal results are displayed) Labs Reviewed  COMPREHENSIVE METABOLIC PANEL - Abnormal; Notable for the following components:      Result Value   Glucose, Bld 116 (*)    Total Bilirubin 1.6 (*)    All other components within normal limits  CBC WITH DIFFERENTIAL/PLATELET    EKG None  Radiology DG Chest Portable 1 View  Result Date: 09/22/2020 CLINICAL DATA:  Cough, nausea COVID positive EXAM: PORTABLE CHEST 1 VIEW COMPARISON:  September 20, 2020. FINDINGS: Patient is rotated. The heart size and mediastinal contours are unchanged. Hiatal hernia. Low lung volumes with elevation of the right hemidiaphragm, unchanged. No focal consolidation. No visible pleural effusion or pneumothorax. The visualized skeletal structures are unchanged. IMPRESSION: No acute cardiopulmonary findings. Electronically Signed   By: Dahlia Bailiff MD   On:  09/22/2020 18:39    Procedures Procedures   Medications Ordered in ED Medications  sodium chloride 0.9 % bolus 500 mL (0  mLs Intravenous Stopped 09/22/20 2041)    ED Course  I have reviewed the triage vital signs and the nursing notes.  Pertinent labs & imaging results that were available during my care of the patient were reviewed by me and considered in my medical decision making (see chart for details).    MDM Rules/Calculators/A&P                          50 year-old male past ministry of COPD who presents for evaluation of COVID-19.  Reports he was seen here on 09/20/2020 and diagnosed with COVID-19.  He was admitted overnight for observation was discharged yesterday.  He reports that today, he just did not feel good.  He was having shortness of breath, cough.  He is also states that he just has been tired and fatigued.  He has had some chills and felt hot at home but no measured any fever.  On initial arrival, he is afebrile, toxic appearing.  Vital signs are stable.  On exam, he has chronic atrophy of his muscles consistent with history of CP.  Lungs overall sound reassuring.  No abdominal tenderness.  We will plan to check labs, chest x-ray.  Reviewed his records.  He was admitted after being here on 09/20/2020.  Did have some intermittent tachycardia at that time.  He had extensive work-up done during his hospitalization.  He had D-dimer that was negative x2.  He was felt to be stable and was discharged home.  He is supposed to be on dexamethasone.  CMP shows slight hyperglycemia of 116. BUN/Cr within normal limits. CBC shows no leukocytosis or anemia. CXR negative for any acute abnormalities.   Patient has been observed here in the ED for 5 hours.  He has been hemodynamically stable and no signs of tachycardia, hypoxia.  He is requesting to eat something.  Fever has improved after Tylenol here in the ED.  At this time, no criteria for admission.  I discussed with him that his symptoms  may be related to ongoing COVID-19 infection. At this time, patient exhibits no emergent life-threatening condition that require further evaluation in ED. Discussed patient with Dr. Sherry Ruffing who is agreeable to plan. Updated patient's caregiver. Patient had ample opportunity for questions and discussion. All patient's questions were answered with full understanding. Strict return precautions discussed. Patient expresses understanding and agreement to plan.   Portions of this note were generated with [employer]. Dictation errors may occur despite best attempts at proofreading.   Final Clinical Impression(s) / ED Diagnoses Final diagnoses:  COVID-19  Cough  Shortness of breath    Rx / DC Orders ED Discharge Orders     None        Desma Mcgregor 09/22/20 2348    Tegeler, Gwenyth Allegra, MD 09/23/20 [phone]

## 2020-09-22 NOTE — ED Triage Notes (Signed)
Patient here with c/o Covid19 symptoms. Patient tested positive for Covid19 on 09/20/20 and was discharged yesterday. Patient report that he is feeling bad. Patient given 1000 mg of tylenol by ems at 1625.

## 2020-09-22 NOTE — ED Notes (Signed)
[facility] called for transport back home ?

## 2020-09-22 NOTE — Discharge Instructions (Addendum)
As we discussed, your lab work, chest x-ray looked reassuring today.  Your symptoms may be ongoing of your COVID-19 infection.  You can take Tylenol or Ibuprofen as directed for pain. You can alternate Tylenol and Ibuprofen every 4 hours. If you take Tylenol at 1pm, then you can take Ibuprofen at 5pm. Then you can take Tylenol again at 9pm.   Return to emergency department any worsening symptoms, difficulty breathing, vomiting, chest pain or any other worsening concerning symptoms.

## 2020-09-25 ENCOUNTER — Ambulatory Visit: Payer: Medicare Other | Admitting: Podiatry

## 2020-09-26 LAB — CULTURE, BLOOD (ROUTINE X 2)
Culture: NO GROWTH
Culture: NO GROWTH
Special Requests: ADEQUATE
Special Requests: ADEQUATE

## 2020-10-18 ENCOUNTER — Ambulatory Visit: Payer: Medicare Other | Admitting: Podiatry

## 2020-11-26 ENCOUNTER — Other Ambulatory Visit: Payer: Self-pay

## 2020-11-26 ENCOUNTER — Ambulatory Visit (INDEPENDENT_AMBULATORY_CARE_PROVIDER_SITE_OTHER): Payer: Medicare Other | Admitting: Podiatry

## 2020-11-26 DIAGNOSIS — B351 Tinea unguium: Secondary | ICD-10-CM | POA: Diagnosis not present

## 2020-11-26 DIAGNOSIS — M79674 Pain in right toe(s): Secondary | ICD-10-CM | POA: Diagnosis not present

## 2020-11-26 DIAGNOSIS — E118 Type 2 diabetes mellitus with unspecified complications: Secondary | ICD-10-CM

## 2020-11-26 DIAGNOSIS — M79675 Pain in left toe(s): Secondary | ICD-10-CM

## 2020-12-01 ENCOUNTER — Encounter: Payer: Self-pay | Admitting: Podiatry

## 2020-12-01 NOTE — Progress Notes (Signed)
  Subjective:  Patient ID: Thomas Ramos, male    DOB: [birth year],  MRN: [id number]  50 y.o. male presents preventative diabetic foot care and thick, elongated toenails b/l feet which are tender when wearing enclosed shoe gear.  He states he did not check his blood glucose today.  PCP is Chesley Noon, MD , and last visit was 10/10/2020.  Allergies  Allergen Reactions   Penicillins Hives, Nausea And Vomiting and Other (See Comments)    Patient tolerated cefazolin in 2017 Has patient had a PCN reaction causing immediate rash, facial/tongue/throat swelling, SOB or lightheadedness with hypotension: Yes Has patient had a PCN reaction causing severe rash involving mucus membranes or skin necrosis: No Has patient had a PCN reaction that required hospitalization No Has patient had a PCN reaction occurring within the last 10 years: Yes If all of the above answers are "NO", then may proceed with Cephalosporin use.   Sulfamethoxazole-Trimethoprim Nausea And Vomiting   Metronidazole Nausea And Vomiting    Review of Systems: Negative except as noted in the HPI.   Objective:  Vascular Examination: Vascular status intact b/l with palpable pedal pulses. CFT <3 seconds b/l. Nonpitting edema present b/l feet.Marland Kitchen No pain with calf compression b/l. Skin temperature gradient WNL b/l.   Neurological Examination: Sensation grossly intact b/l with 10 gram monofilament. Vibratory sensation intact b/l.   Dermatological Examination: Pedal skin warm and supple b/l. Toenails 1-5 b/l thick, discolored, elongated with subungual debris and pain on dorsal palpation. No hyperkeratotic lesions noted b/l.   Musculoskeletal Examination: Flaccid LE bilaterally. Utilizes motorized chair for mobility assistance.  Radiographs: None Assessment:   1. Pain due to onychomycosis of toenails of both feet   2. Type 2 diabetes mellitus with complication, without long-term current use of insulin ([place])     Plan:   -Continue diabetic foot care principles: inspect feet daily, monitor glucose as recommended by PCP and/or Endocrinologist, and follow prescribed diet per PCP, Endocrinologist and/or dietician. -Patient to continue soft, supportive shoe gear daily. -Toenails 1-5 b/l were debrided in length and girth with sterile nail nippers and dremel without iatrogenic bleeding.  -Patient to report any pedal injuries to medical professional immediately. -Patient/POA to call should there be question/concern in the interim.  Return in about 3 months (around 02/25/2021).  Marzetta Board, DPM

## 2021-01-09 ENCOUNTER — Other Ambulatory Visit: Payer: Self-pay

## 2021-01-09 ENCOUNTER — Encounter: Payer: Self-pay | Admitting: Internal Medicine

## 2021-01-09 ENCOUNTER — Ambulatory Visit (INDEPENDENT_AMBULATORY_CARE_PROVIDER_SITE_OTHER): Payer: Medicare Other | Admitting: Internal Medicine

## 2021-01-09 VITALS — BP 123/84 | HR 102 | Ht 66.0 in

## 2021-01-09 DIAGNOSIS — R5383 Other fatigue: Secondary | ICD-10-CM | POA: Diagnosis not present

## 2021-01-09 DIAGNOSIS — I10 Essential (primary) hypertension: Secondary | ICD-10-CM

## 2021-01-09 DIAGNOSIS — R9431 Abnormal electrocardiogram [ECG] [EKG]: Secondary | ICD-10-CM | POA: Diagnosis not present

## 2021-01-09 DIAGNOSIS — I872 Venous insufficiency (chronic) (peripheral): Secondary | ICD-10-CM

## 2021-01-09 DIAGNOSIS — R000 Tachycardia, unspecified: Secondary | ICD-10-CM | POA: Diagnosis not present

## 2021-01-09 DIAGNOSIS — E785 Hyperlipidemia, unspecified: Secondary | ICD-10-CM

## 2021-01-09 DIAGNOSIS — I5032 Chronic diastolic (congestive) heart failure: Secondary | ICD-10-CM

## 2021-01-09 NOTE — Progress Notes (Signed)
Cardiology Office Note:    Date:  01/09/2021   ID:  Thomas Ramos, DOB [birth year], MRN [id number]  PCP:  Chesley Noon, MD  Cardiologist:  None  Electrophysiologist:  None   Referring MD: Chesley Noon, MD   Chief Complaint/Reason for Referral: Follow up fatigue, murmur  History of Present Illness:    Thomas Ramos is a 50 y.o. male with a history of chronic diastolic heart failure, hypertension, hyperlipidemia, GERD, diabetes, and cerebral palsy coming in today for a 6 month follow-up.  He was seen in the ED for COVID-19 on 09/22/20.  Today, he is doing well. His metoprolol was discontinued at our last visit in February because of fatigue. He has been feeling much better since then. His medications are ordered through his pharmacy and his aide typically helps with administering his medicine.  We discussed his echo (5/27). He does not have any major concerns. Hyperdynamic LV noted with no other structural concerns. Suspect murmur is flow related.   The patient denies chest pain, chest pressure, dyspnea at rest or with exertion, PND, orthopnea, or leg swelling. Denies cough, fever, chills, nausea, or vomiting. Denies syncope, presyncope, or snoring. Denies dizziness or lightheadedness.   Past Medical History:  Diagnosis Date   Anxiety    Asthma    Cellulitis 08/04/2017   Cerebral palsy (HCC)    Chronic diastolic (congestive) heart failure (HCC)    CP (cerebral palsy) (HCC)    Diabetes mellitus without complication ([place])    borderline   Drug induced constipation    Environmental allergies    takes inhalers if needed   Esophageal stricture    GERD (gastroesophageal reflux disease)    Hypertension    Motility disorder, esophageal    Pneumonia    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[place])    S/P Botox injection    approx every 4 months   Seasonal allergies     Past Surgical History:  Procedure Laterality Date   BOTOX INJECTION N/A 12/21/2012   Procedure: BOTOX  INJECTION;  Surgeon: Inda Castle, MD;  Location: [facility];  Service: Endoscopy;  Laterality: N/A;   BOTOX INJECTION N/A 06/28/2014   Procedure: BOTOX INJECTION;  Surgeon: Inda Castle, MD;  Location: [facility];  Service: Endoscopy;  Laterality: N/A;   ESOPHAGOGASTRODUODENOSCOPY N/A 12/21/2012   Procedure: ESOPHAGOGASTRODUODENOSCOPY (EGD);  Surgeon: Inda Castle, MD;  Location: [facility];  Service: Endoscopy;  Laterality: N/A;   ESOPHAGOGASTRODUODENOSCOPY N/A 06/28/2014   Procedure: ESOPHAGOGASTRODUODENOSCOPY (EGD);  Surgeon: Inda Castle, MD;  Location: [facility];  Service: Endoscopy;  Laterality: N/A;   ESOPHAGOGASTRODUODENOSCOPY (EGD) WITH PROPOFOL N/A 09/03/2017   Procedure: ESOPHAGOGASTRODUODENOSCOPY (EGD) WITH PROPOFOL;  Surgeon: Doran Stabler, MD;  Location: [facility];  Service: Gastroenterology;  Laterality: N/A;   ESOPHAGUS SURGERY     stretched esophagus   legs     MOUTH SURGERY     TENDON RELEASE      Current Medications: Current Meds  Medication Sig   Albuterol Sulfate (PROAIR RESPICLICK) 419 (90 Base) MCG/ACT AEPB Inhale into the lungs.   amLODipine (NORVASC) 10 MG tablet Take 10 mg by mouth daily.   aspirin 81 MG chewable tablet Chew by mouth daily.   baclofen (LIORESAL) 10 MG tablet Take 5 mg by mouth at bedtime.    budesonide-formoterol (SYMBICORT) 80-4.5 MCG/ACT inhaler Inhale 2 puffs into the lungs 2 (two) times daily.   [employer] CLOTRIMAZOLE 1 % cream Apply 1 application topically 2 (two) times daily  as needed (rash). BETWEEN TOES   docusate sodium (COLACE) 100 MG capsule Take 1 capsule (100 mg total) by mouth every 12 (twelve) hours. (Patient taking differently: Take 100 mg by mouth as needed.)   Dulaglutide 0.75 MG/0.5ML SOPN Inject 0.75 mg into the skin once a week. Tuesdays   empagliflozin (JARDIANCE) 25 MG TABS tablet Take one tablet (25 mg dose) by mouth daily.   furosemide (LASIX) 20 MG tablet Take 1 tablet by mouth daily.   latanoprost  (XALATAN) 0.005 % ophthalmic solution Place 1 drop into both eyes daily as needed (dry eyes).    montelukast (SINGULAIR) 10 MG tablet Take 10 mg by mouth daily.    mupirocin ointment (BACTROBAN) 2 % Apply to left great toe nailbed once daily for 3 weeks.   naproxen sodium (ALEVE) 220 MG tablet Take 220-440 mg by mouth 2 (two) times daily as needed (for pain).   nitroGLYCERIN (NITROSTAT) 0.4 MG SL tablet Place 0.4 mg under the tongue every 5 (five) minutes as needed for chest pain.    omeprazole (PRILOSEC) 20 MG capsule Take 1 capsule by mouth every morning.   potassium chloride (KLOR-CON) 10 MEQ tablet Take 1 tablet by mouth daily.   rosuvastatin (CRESTOR) 5 MG tablet Take 5 mg by mouth at bedtime.   timolol (TIMOPTIC) 0.5 % ophthalmic solution 1 drop 2 (two) times daily.   valsartan (DIOVAN) 80 MG tablet Take 80 mg by mouth daily.   [DISCONTINUED] metoprolol succinate (TOPROL-XL) 25 MG 24 hr tablet Take 25 mg by mouth daily.     Allergies:   Penicillins, Sulfamethoxazole-trimethoprim, and Metronidazole   Social History   Tobacco Use   Smoking status: Never   Smokeless tobacco: Former   Tobacco comments:    smoked one time  Vaping Use   Vaping Use: Never used  Substance Use Topics   Alcohol use: Yes    Comment: 2 beers every other Friday and Saturday   Drug use: No     Family History: The patient's family history includes Diabetes in his mother; Hypertension in his father; Lung cancer in his father.  ROS:   Please see the history of present illness.    All other systems reviewed and are negative.  EKGs/Labs/Other Studies Reviewed:    The following studies were reviewed today: Echo 08/09/20 1. Left ventricular ejection fraction, by estimation, is 70 to 75%. The  left ventricle has hyperdynamic function. The left ventricle has no  regional wall motion abnormalities. There is mild left ventricular  hypertrophy. Left ventricular diastolic  parameters are consistent with Grade  I diastolic dysfunction (impaired  relaxation).   2. Right ventricular systolic function is normal. The right ventricular  size is normal.   3. The mitral valve is normal in structure. No evidence of mitral valve  regurgitation. No evidence of mitral stenosis.   4. The aortic valve is normal in structure. Aortic valve regurgitation is  not visualized. No aortic stenosis is present.   5. The inferior vena cava is normal in size with greater than 50%  respiratory variability, suggesting right atrial pressure of 3 mmHg.   EKG:   01/09/21: No EKG ordered today 04/25/20: Sinus tachycardia Rate 110 bpm  Imaging studies that I have independently reviewed today: n/a  Recent Labs: 09/21/2020: Magnesium 2.0 09/22/2020: ALT 20; BUN 17; Creatinine, Ser 0.84; Hemoglobin 13.6; Platelets 170; Potassium 4.7; Sodium 142  Recent Lipid Panel    Component Value Date/Time   CHOL 194 04/17/2010 2056  TRIG 86 09/20/2020 2151   HDL 27 (L) 04/17/2010 2056   CHOLHDL 7.2 Ratio 04/17/2010 2056   VLDL 35 04/17/2010 2056   LDLCALC 132 (H) 04/17/2010 2056    Physical Exam:    VS:  BP 123/84   Pulse (!) 102   Ht 5\' 6"  (1.676 m)   SpO2 96%   BMI 24.91 kg/m     Wt Readings from Last 5 Encounters:  09/20/20 154 lb 5.2 oz (70 kg)  04/25/20 145 lb (65.8 kg)  01/08/20 190 lb (86.2 kg)  03/21/19 190 lb (86.2 kg)  02/03/18 162 lb 14.7 oz (73.9 kg)    Constitutional: No acute distress Eyes: sclera non-icteric, normal conjunctiva and lids ENMT: moist mucous membranes Cardiovascular: regular rhythm, tachycardic, soft systolic murmur. S1 and S2 normal. No jugular venous distention.  Respiratory: clear to auscultation bilaterally GI : normal bowel sounds, soft and nontender. No distention.   MSK: no edema. MSK deformities related to CP NEURO: grossly nonfocal exam, moves all extremities. PSYCH: alert and oriented x 3, normal mood and affect.   ASSESSMENT:    1. Fatigue, unspecified type   2. Tachycardia    3. Abnormal electrocardiogram (ECG) (EKG)    4. Essential hypertension   5. Chronic diastolic (congestive) heart failure (HCC)   6. Chronic venous insufficiency   7. Hyperlipidemia, unspecified hyperlipidemia type    PLAN:    Fatigue, unspecified type - improved off of metoprolol.  -We discussed in shared decision making treating sinus tachycardia vs observation and patient much prefers strategy of observation. Can consider diltiazem but with normal echo would not aggressively treat sinus tach.   Tachycardia Abnormal electrocardiogram (ECG) (EKG)  - no worrisome findings on echo.  Essential hypertension - continue amlodipine 10 mg daily, furosemide 20 mg daily, and valsartan 80 mg daily. BP normal today.    Chronic diastolic (congestive) heart failure (HCC) - euvolemic today. Continue furosemide 20 mg daily  Chronic venous insufficiency - no concerns today.   Hyperlipidemia, unspecified hyperlipidemia type - continue crestor 5 mg daily. LDL and trig both have improved, both approaching goal.   Total time of encounter: 30 minutes total time of encounter, including 25 minutes spent in face-to-face patient care on the date of this encounter. This time includes coordination of care and counseling regarding above mentioned problem list. Remainder of non-face-to-face time involved reviewing chart documents/testing relevant to the patient encounter and documentation in the medical record. I have independently reviewed documentation from referring provider.   Cherlynn Kaiser, MD, [place]   Shared Decision Making/Informed Consent:       Medication Adjustments/Labs and Tests Ordered: Current medicines are reviewed at length with the patient today.  Concerns regarding medicines are outlined above.   No orders of the defined types were placed in this encounter.   No orders of the defined types were placed in this encounter.   Patient Instructions   Medication Instructions:  No Changes In Medications at this time.  *If you need a refill on your cardiac medications before your next appointment, please call your pharmacy*  Follow-Up: At [facility], you and your health needs are our priority.  As part of our continuing mission to provide you with exceptional heart care, we have created designated Provider Care Teams.  These Care Teams include your primary Cardiologist (physician) and Advanced Practice Providers (APPs -  Physician Assistants and Nurse Practitioners) who all work together to provide you with the care you need,  when you need it.  We recommend signing up for the patient portal called "MyChart".  Sign up information is provided on this After Visit Summary.  MyChart is used to connect with patients for Virtual Visits (Telemedicine).  Patients are able to view lab/test results, encounter notes, upcoming appointments, etc.  Non-urgent messages can be sent to your provider as well.   To learn more about what you can do with MyChart, go to [email or web address].    Your next appointment:   1 year(s)  The format for your next appointment:   In Person  Provider:   Cherlynn Kaiser, MD   Wilhemina Bonito as a scribe for Elouise Munroe, MD.,have documented all relevant documentation on the behalf of Elouise Munroe, MD,as directed by  Elouise Munroe, MD while in the presence of Elouise Munroe, MD.  I, Elouise Munroe, MD, have reviewed all documentation for this visit. The documentation on 01/09/21 for the exam, diagnosis, procedures, and orders are all accurate and complete.

## 2021-01-09 NOTE — Patient Instructions (Signed)
Medication Instructions:  No Changes In Medications at this time.  *If you need a refill on your cardiac medications before your next appointment, please call your pharmacy*  Follow-Up: At [facility], you and your health needs are our priority.  As part of our continuing mission to provide you with exceptional heart care, we have created designated Provider Care Teams.  These Care Teams include your primary Cardiologist (physician) and Advanced Practice Providers (APPs -  Physician Assistants and Nurse Practitioners) who all work together to provide you with the care you need, when you need it.  We recommend signing up for the patient portal called "MyChart".  Sign up information is provided on this After Visit Summary.  MyChart is used to connect with patients for Virtual Visits (Telemedicine).  Patients are able to view lab/test results, encounter notes, upcoming appointments, etc.  Non-urgent messages can be sent to your provider as well.   To learn more about what you can do with MyChart, go to [email or web address].    Your next appointment:   1 year(s)  The format for your next appointment:   In Person  Provider:   Cherlynn Kaiser, MD

## 2021-02-18 DIAGNOSIS — S91301A Unspecified open wound, right foot, initial encounter: Secondary | ICD-10-CM | POA: Insufficient documentation

## 2021-02-24 ENCOUNTER — Encounter: Payer: Self-pay | Admitting: Podiatry

## 2021-02-24 ENCOUNTER — Ambulatory Visit (INDEPENDENT_AMBULATORY_CARE_PROVIDER_SITE_OTHER): Payer: Medicare Other | Admitting: Podiatry

## 2021-02-24 ENCOUNTER — Other Ambulatory Visit: Payer: Self-pay

## 2021-02-24 DIAGNOSIS — E118 Type 2 diabetes mellitus with unspecified complications: Secondary | ICD-10-CM

## 2021-02-24 DIAGNOSIS — Z87898 Personal history of other specified conditions: Secondary | ICD-10-CM

## 2021-02-24 NOTE — Progress Notes (Signed)
  Subjective:  Patient ID: Thomas Ramos, male    DOB: August 20, 1970,   MRN: [id number]  Chief Complaint  Patient presents with   Follow-up    Left foot blister follow up     50 y.o. male presents for concern of blister on his left foot  .Relates he has been following with wound care in [place] for wound on his left foot that was a blister. Wanted [place] to take a look. Denies any drainage has been using a bandaid over the area.  Denies any other pedal complaints. Denies n/v/f/c.   Past Medical History:  Diagnosis Date   Anxiety    Asthma    Cellulitis 08/04/2017   Cerebral palsy (HCC)    Chronic diastolic (congestive) heart failure (HCC)    CP (cerebral palsy) (HCC)    Diabetes mellitus without complication ([place])    borderline   Drug induced constipation    Environmental allergies    takes inhalers if needed   Esophageal stricture    GERD (gastroesophageal reflux disease)    Hypertension    Motility disorder, esophageal    Pneumonia    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HCC)    S/P Botox injection    approx every 4 months   Seasonal allergies     Objective:  Physical Exam: Vascular: DP/PT pulses 2/4 bilateral. CFT <3 seconds. Normal hair growth on digits. No edema.  Skin. No lacerations or abrasions bilateral feet. Nails 1-5 are thickened discolored with subungual debris.  Hyperpigmented area to anterior left ankle. No open wound noted. Appears to be healed. No blistering noted.  Musculoskeletal: MMT 5/5 bilateral lower extremities in DF, PF, Inversion and Eversion. Deceased ROM in DF of ankle joint.  Neurological: Sensation intact to light touch.   Assessment:   1. Type 2 diabetes mellitus with complication, without long-term current use of insulin ([place])   2. History of ulceration      Plan:  Patient was evaluated and treated and all questions answered. -Discussed and educated patient on diabetic foot care, especially with regards to the vascular, neurological and  musculoskeletal systems.  -Stressed the importance of good glycemic control and the detriment of not  controlling glucose levels in relation to the foot. -Discussed supportive shoes at all times and checking feet regularly.  -ulcer appears healed and doing well.  -Recommend continue with bandaid over area for protection.  -Answered all patient questions -Patient to return  in 3 months for at risk foot care with Dr. Elisha Ponder  -Patient advised to call the office if any problems or questions arise in the meantime.   Lorenda Peck, DPM

## 2021-03-03 ENCOUNTER — Ambulatory Visit: Payer: Medicare Other | Admitting: Podiatry

## 2021-03-05 ENCOUNTER — Ambulatory Visit: Payer: Medicare Other | Admitting: Podiatry

## 2021-04-21 ENCOUNTER — Ambulatory Visit: Payer: Medicare Other | Admitting: Podiatry

## 2021-04-22 ENCOUNTER — Telehealth: Payer: Self-pay | Admitting: Gastroenterology

## 2021-04-22 NOTE — Telephone Encounter (Signed)
Inbound call from patient, stated that he would like a returned call from the nurse. Patient sounded far away from the phone so I could not hear him very well. Please advise.

## 2021-04-22 NOTE — Telephone Encounter (Signed)
Returned call to patient. He sounded far away form the phone and I could barely hear him. He told me that he will call me back from a different number.

## 2021-04-22 NOTE — Telephone Encounter (Signed)
Received a return call from patient. He states that he has been having loose stools for the past couple of days. He reports that he has been drinking coffee also. I told pt that he may want to try holding the coffee for a few days so that his stomach can settle. He wanted to know if he could switch to a different coffee brand, I told him that would be fine but he still needs to wait a few days. I told him to take imodium as needed for the loose stools. Pt would like for me to fax this information to him at [phone]. Pt already has af/u appt scheduled. Pt verbalized understanding of all information and had no concerns at the end of the call.  Letter faxed to patient as requested.

## 2021-04-25 ENCOUNTER — Telehealth: Payer: Self-pay | Admitting: Gastroenterology

## 2021-04-25 NOTE — Telephone Encounter (Signed)
Rep called from pharmacy to advise they did a script for the Imodium OTC. Sent to fax: [phone].

## 2021-04-25 NOTE — Telephone Encounter (Signed)
Letter sent to fax requested below.

## 2021-04-29 ENCOUNTER — Other Ambulatory Visit: Payer: Self-pay

## 2021-04-29 ENCOUNTER — Encounter: Payer: Self-pay | Admitting: Podiatry

## 2021-04-29 ENCOUNTER — Ambulatory Visit (INDEPENDENT_AMBULATORY_CARE_PROVIDER_SITE_OTHER): Payer: Medicare Other | Admitting: Podiatry

## 2021-04-29 DIAGNOSIS — M2141 Flat foot [pes planus] (acquired), right foot: Secondary | ICD-10-CM | POA: Diagnosis not present

## 2021-04-29 DIAGNOSIS — B351 Tinea unguium: Secondary | ICD-10-CM | POA: Diagnosis not present

## 2021-04-29 DIAGNOSIS — M79674 Pain in right toe(s): Secondary | ICD-10-CM | POA: Diagnosis not present

## 2021-04-29 DIAGNOSIS — M79675 Pain in left toe(s): Secondary | ICD-10-CM | POA: Diagnosis not present

## 2021-04-29 DIAGNOSIS — E118 Type 2 diabetes mellitus with unspecified complications: Secondary | ICD-10-CM

## 2021-04-29 DIAGNOSIS — E119 Type 2 diabetes mellitus without complications: Secondary | ICD-10-CM

## 2021-04-29 DIAGNOSIS — Z87898 Personal history of other specified conditions: Secondary | ICD-10-CM | POA: Diagnosis not present

## 2021-04-29 DIAGNOSIS — M2142 Flat foot [pes planus] (acquired), left foot: Secondary | ICD-10-CM

## 2021-05-04 NOTE — Progress Notes (Signed)
ANNUAL DIABETIC FOOT EXAM  Subjective: Thomas Ramos presents today for for annual diabetic foot examination.  Patient has h/o foot wounds; most recently anterior aspect of both ankles which have healed. He was evaluated by Dr. Blenda Mounts and she confirmed healing.  Patient denies any numbness, tingling, burning, or pins/needle sensation in feet.  Patient does not monitor blood glucose daily.  Risk factors: diabetes, hyperlipidemia, HTN, CHF.  Thomas Dials, PA-C is patient's PCP. Last visit was April 24, 2021.  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[place])    S/P Botox injection    approx every 4 months   Seasonal allergies    Patient Active Problem List   Diagnosis Date Noted   Wound of right foot 02/18/2021   Cough 09/20/2020   Heart murmur 02/26/2020   Pneumonia due to COVID-19 virus 03/21/2019   COVID-19 03/21/2019   Hiatal hernia    Cellulitis of right lower extremity without foot 08/03/2017   Type 2 diabetes mellitus with complication, without long-term current use of insulin ([place]) 08/03/2017   PNA (pneumonia) 05/07/2017   SIRS (systemic inflammatory response syndrome) (HCC)    Acute respiratory failure with hypoxia ([place]) 03/13/2017   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[place])    Rhinovirus infection 08/17/2016   Pressure injury of skin 08/16/2016   Acute respiratory distress 08/15/2016   Tachycardia 08/15/2016   Sepsis ([place]) 08/15/2016   Onychomycosis 02/20/2016   Essential hypertension    OSA (obstructive sleep  apnea) 12/11/2015   Chronic diastolic (congestive) heart failure ([place])    Chronic venous insufficiency    Depression with anxiety 09/20/2015   CAP (community acquired pneumonia) 09/20/2015   Hypersomnia 07/16/2015   Asthma exacerbation 07/16/2015   GERD (gastroesophageal reflux disease) 07/16/2015   Snoring 07/16/2015   Hematemesis with nausea    Fever 05/24/2014   Hyperlipidemia 02/22/2012   Dysphagia 09/16/2010   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[place]) 09/16/2010   Past Surgical History:  Procedure Laterality Date   BOTOX INJECTION N/A 12/21/2012   Procedure: BOTOX INJECTION;  Surgeon: Inda Castle, MD;  Location: [facility];  Service: Endoscopy;  Laterality: N/A;   BOTOX INJECTION N/A 06/28/2014   Procedure: BOTOX INJECTION;  Surgeon: Inda Castle, MD;  Location: [facility];  Service: Endoscopy;  Laterality: N/A;   ESOPHAGOGASTRODUODENOSCOPY N/A 12/21/2012   Procedure: ESOPHAGOGASTRODUODENOSCOPY (EGD);  Surgeon: Inda Castle, MD;  Location: [facility];  Service: Endoscopy;  Laterality: N/A;   ESOPHAGOGASTRODUODENOSCOPY N/A 06/28/2014   Procedure: ESOPHAGOGASTRODUODENOSCOPY (EGD);  Surgeon: Inda Castle, MD;  Location: [facility];  Service: Endoscopy;  Laterality: N/A;   ESOPHAGOGASTRODUODENOSCOPY (EGD) WITH PROPOFOL N/A 09/03/2017   Procedure: ESOPHAGOGASTRODUODENOSCOPY (EGD) WITH PROPOFOL;  Surgeon: Doran Stabler, MD;  [facility];  Service: Gastroenterology;  Laterality: N/A;   ESOPHAGUS SURGERY     stretched esophagus   legs     MOUTH SURGERY     TENDON RELEASE     Current Outpatient Medications on  File Prior to Visit  Medication Sig Dispense Refill   tolnaftate (TINACTIN) 1 % cream Apply topically 2 (two) times daily.     Albuterol Sulfate (PROAIR RESPICLICK) 161 (90 Base) MCG/ACT AEPB Inhale into the lungs.     amLODipine (NORVASC) 10 MG tablet Take 10 mg by mouth daily.     amLODipine (NORVASC) 10 MG tablet Take by mouth. (Patient not taking:  Reported on 01/09/2021)     aspirin 81 MG chewable tablet Chew by mouth daily.     baclofen (LIORESAL) 10 MG tablet Take 5 mg by mouth at bedtime.      budesonide-formoterol (SYMBICORT) 80-4.5 MCG/ACT inhaler Inhale 2 puffs into the lungs 2 (two) times daily.     [employer] CLOTRIMAZOLE 1 % cream Apply 1 application topically 2 (two) times daily as needed (rash). BETWEEN TOES  1   docusate sodium (COLACE) 100 MG capsule Take 1 capsule (100 mg total) by mouth every 12 (twelve) hours. (Patient taking differently: Take 100 mg by mouth as needed.) 60 capsule 0   doxycycline (VIBRAMYCIN) 100 MG capsule 1 tablet 2 (two) times daily.     Dulaglutide 0.75 MG/0.5ML SOPN Inject 0.75 mg into the skin once a week. Tuesdays     empagliflozin (JARDIANCE) 25 MG TABS tablet Take one tablet (25 mg dose) by mouth daily.     furosemide (LASIX) 20 MG tablet Take 1 tablet by mouth daily.     latanoprost (XALATAN) 0.005 % ophthalmic solution Place 1 drop into both eyes daily as needed (dry eyes).      latanoprost (XALATAN) 0.005 % ophthalmic solution Place one drop into both eyes daily.     metFORMIN (GLUCOPHAGE) 500 MG tablet Take 250 mg by mouth daily. (Patient not taking: Reported on 01/09/2021)     metoprolol succinate (TOPROL-XL) 25 MG 24 hr tablet Take by mouth.     montelukast (SINGULAIR) 10 MG tablet Take 10 mg by mouth daily.      montelukast (SINGULAIR) 10 MG tablet Take 1 tablet by mouth daily. (Patient not taking: Reported on 01/09/2021)     mupirocin ointment (BACTROBAN) 2 % Apply to left great toe nailbed once daily for 3 weeks. 30 g 1   naproxen sodium (ALEVE) 220 MG tablet Take 220-440 mg by mouth 2 (two) times daily as needed (for pain).     nitroGLYCERIN (NITROSTAT) 0.4 MG SL tablet Place 0.4 mg under the tongue every 5 (five) minutes as needed for chest pain.      nitroGLYCERIN (NITROSTAT) 0.4 MG SL tablet Place under the tongue.     omeprazole (PRILOSEC) 20 MG capsule Take 1 capsule by mouth every  morning.     potassium chloride (KLOR-CON) 10 MEQ tablet Take 1 tablet by mouth daily.     rosuvastatin (CRESTOR) 5 MG tablet Take 5 mg by mouth at bedtime.     timolol (TIMOPTIC) 0.5 % ophthalmic solution 1 drop 2 (two) times daily.     valsartan (DIOVAN) 80 MG tablet Take 80 mg by mouth daily.     No current facility-administered medications on file prior to visit.    Allergies  Allergen Reactions   Penicillins Hives, Nausea And Vomiting and Other (See Comments)    Patient tolerated cefazolin in 2017 Has patient had a PCN reaction causing immediate rash, facial/tongue/throat swelling, SOB or lightheadedness with hypotension: Yes Has patient had a PCN reaction causing severe rash involving mucus membranes or skin necrosis: No Has patient had a PCN reaction that required  hospitalization No Has patient had a PCN reaction occurring within the last 10 years: Yes If all of the above answers are "NO", then may proceed with Cephalosporin use.   Sulfamethoxazole-Trimethoprim Nausea And Vomiting   Metronidazole Nausea And Vomiting   Social History   Occupational History   Occupation: Disabled  Tobacco Use   Smoking status: Never   Smokeless tobacco: Former   Tobacco comments:    smoked one time  Media planner   Vaping Use: Never used  Substance and Sexual Activity   Alcohol use: Yes    Comment: 2 beers every other Friday and Saturday   Drug use: No   Sexual activity: Not on file   Family History  Problem Relation Age of Onset   Hypertension Father    Lung cancer Father    Diabetes Mother    Immunization History  Administered Date(s) Administered   Influenza, Seasonal, Injecte, Preservative Fre 12/13/2014, 12/09/2015   Influenza-Unspecified 12/15/2015, 12/16/2017, 12/29/2018   PFIZER(Purple Top)SARS-COV-2 Vaccination 05/05/2019, 05/26/2019   Tdap 07/09/2015     Review of Systems: Negative except as noted in the HPI.   Objective: There were no vitals filed for this  visit.  Thomas Ramos is a pleasant 50 y.o. male in NAD. AAO X 3.  Vascular Examination: CFT <3 seconds b/l LE. Palpable pedal pulses b/l LE. Pedal hair sparse. No pain with calf compression b/l. Lower extremity skin temperature gradient within normal limits. Dependent edema noted b/l LE. No cyanosis or clubbing noted b/l LE.  Dermatological Examination: No open wounds b/l LE. No interdigital macerations noted b/l LE. Toenails 1-5 b/l elongated, discolored, dystrophic, thickened, crumbly with subungual debris and tenderness to dorsal palpation. No hyperkeratotic nor porokeratotic lesions present on today's visit. Scarring from previous blisters anterior ankles. Completely healed.  Musculoskeletal Examination: Flaccid lower extremity noted b/l lower extremities. Overlapping great toe left foot. Pes planus deformity noted bilateral LE. Utilizes motorized chair for mobility assistance.  Footwear Assessment: Does the patient wear appropriate shoes? No. Does the patient need inserts/orthotics? Yes.  Neurological Examination: Protective sensation intact 5/5 intact bilaterally with 10g monofilament b/l.  Hemoglobin A1C Latest Ref Rng & Units 09/20/2020  HGBA1C 4.8 - 5.6 % 6.6(H)  Some recent data might be hidden   Assessment: 1. Pain due to onychomycosis of toenails of both feet   2. History of ulceration   3. Pes planus of both feet   4. Type 2 diabetes mellitus with complication, without long-term current use of insulin ([place])   5. Encounter for diabetic foot exam ([place])     ADA Risk Categorization: High Risk  Patient has one or more of the following: Loss of protective sensation Absent pedal pulses Severe Foot deformity History of foot ulcer  Plan: -Diabetic foot examination performed today. -Continue foot and shoe inspections daily. Monitor blood glucose per PCP/Endocrinologist's recommendations. -Mycotic toenails 1-5 bilaterally were debrided in length and girth with sterile nail  nippers and dremel without incident. -Patient/POA to call should there be question/concern in the interim.  Return in about 3 months (around 07/27/2021).  Marzetta Board, DPM

## 2021-05-15 ENCOUNTER — Encounter: Payer: Self-pay | Admitting: Gastroenterology

## 2021-05-15 ENCOUNTER — Other Ambulatory Visit: Payer: Self-pay

## 2021-05-15 ENCOUNTER — Ambulatory Visit (INDEPENDENT_AMBULATORY_CARE_PROVIDER_SITE_OTHER): Payer: Medicare Other | Admitting: Gastroenterology

## 2021-05-15 VITALS — BP 128/84 | HR 103

## 2021-05-15 DIAGNOSIS — R1319 Other dysphagia: Secondary | ICD-10-CM | POA: Diagnosis not present

## 2021-05-15 DIAGNOSIS — K529 Noninfective gastroenteritis and colitis, unspecified: Secondary | ICD-10-CM

## 2021-05-15 NOTE — Patient Instructions (Signed)
If you are age 49 or older, your body mass index should be between 23-30. Your There is no height or weight on file to calculate BMI. If this is out of the aforementioned range listed, please consider follow up with your Primary Care Provider. ? ?If you are age 45 or younger, your body mass index should be between 19-25. Your There is no height or weight on file to calculate BMI. If this is out of the aformentioned range listed, please consider follow up with your Primary Care Provider.  ? ?________________________________________________________ ? ?The  GI providers would like to encourage you to use [facility] to communicate with providers for non-urgent requests or questions.  Due to long hold times on the telephone, sending your provider a message by [facility] may be a faster and more efficient way to get a response.  Please allow 48 business hours for a response.  Please remember that this is for non-urgent requests.  ?_______________________________________________________ ? ?You have been scheduled for an endoscopy and colonoscopy. Please follow the written instructions given to you at your visit today. ?Please pick up your prep supplies at the pharmacy within the next 1-3 days. ?If you use inhalers (even only as needed), please bring them with you on the day of your procedure. ? ?We will reach you closer to this date for hospital admission.  ? ?It was a pleasure to see you today! ? ?Thank you for trusting me with your gastrointestinal care!   ? ? ?

## 2021-05-15 NOTE — Progress Notes (Addendum)
[facility] Progress Note:  History: Thomas Ramos 05/15/2021  Referring provider: Aura Dials, PA-C  Reason for consult/chief complaint: Diarrhea   Subjective  HPI: Thomas Ramos is well-known to me for dysphagia with a history of aspiration related to a large hiatal hernia with distal esophageal tortuosity made more challenging by his contracted body habitus from CP.  He also has chronic constipation related to CP and relative immobility, and periodically has loose stools from metformin for which he prefers to take as needed Imodium. Last seen June 2022 in the office, extensive note outlines his history.  He preferred Cologuard for colon cancer screening, it was ordered but no results received.  I had a long visit with Thomas Ramos today regarding some things that been bothering him.  He has intermittent dysphagia, but he says this is typical for him and he is able to eat reasonably well and maintain his nutrition.  He has not been hospitalized since I last saw him.  He wanted to talk to me about his diarrhea, which she says occurs more days than not.  He was unable to do the Cologuard because his caregiver (with whom he reportedly lives in an apartment) would not do it.  He says he is not happy with his living situation overall.  He has not been harmed nor does he feel unsafe, but he just feels this individual is not always taking care of his things for him. Thomas Ramos wants to take Imodium sometimes for his diarrhea, but his caregiver says it is not on his medicine list so he does not give it to him.  He denies rectal bleeding, does not think he has likely lost any weight.  ROS:  Review of Systems  Constitutional:  Negative for appetite change and unexpected weight change.  HENT:  Negative for mouth sores and voice change.   Eyes:  Negative for pain and redness.  Respiratory:  Positive for cough. Negative for shortness of breath.   Cardiovascular:  Negative for chest pain and  palpitations.  Genitourinary:  Negative for dysuria and hematuria.  Musculoskeletal:  Positive for arthralgias. Negative for myalgias.       Chronic pain from contractures on the right side  Skin:  Negative for pallor and rash.  Neurological:  Negative for weakness and headaches.  Hematological:  Negative for adenopathy.    Past Medical History: Past Medical History:  Diagnosis Date   Anxiety    Asthma    Cellulitis 08/04/2017   Cerebral palsy (HCC)    Chronic diastolic (congestive) heart failure (HCC)    CP (cerebral palsy) (HCC)    Diabetes mellitus without complication ([place])    borderline   Drug induced constipation    Environmental allergies    takes inhalers if needed   Esophageal stricture    GERD (gastroesophageal reflux disease)    Hypertension    Motility disorder, esophageal    Pneumonia    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HCC)    S/P Botox injection    approx every 4 months   Seasonal allergies      Past Surgical History: Past Surgical History:  Procedure Laterality Date   BOTOX INJECTION N/A 12/21/2012   Procedure: BOTOX INJECTION;  Surgeon: Inda Castle, MD;  [facility]: [facility];  Service: Endoscopy;  Laterality: N/A;   BOTOX INJECTION N/A 06/28/2014   Procedure: BOTOX INJECTION;  Surgeon: Inda Castle, MD;  Location: [facility];  Service: Endoscopy;  Laterality: N/A;   ESOPHAGOGASTRODUODENOSCOPY N/A 12/21/2012  Procedure: ESOPHAGOGASTRODUODENOSCOPY (EGD);  Surgeon: Inda Castle, MD;  Location: [facility];  Service: Endoscopy;  Laterality: N/A;   ESOPHAGOGASTRODUODENOSCOPY N/A 06/28/2014   Procedure: ESOPHAGOGASTRODUODENOSCOPY (EGD);  Surgeon: Inda Castle, MD;  Location: [facility];  Service: Endoscopy;  Laterality: N/A;   ESOPHAGOGASTRODUODENOSCOPY (EGD) WITH PROPOFOL N/A 09/03/2017   Procedure: ESOPHAGOGASTRODUODENOSCOPY (EGD) WITH PROPOFOL;  Surgeon: Doran Stabler, MD;  [facility];  Service: Gastroenterology;  Laterality:  N/A;   ESOPHAGUS SURGERY     stretched esophagus   legs     MOUTH SURGERY     TENDON RELEASE       Family History: Family History  Problem Relation Age of Onset   Hypertension Father    Lung cancer Father    Diabetes Mother     Social History: Social History   Socioeconomic History   Marital status: Divorced    Spouse name: Not on file   Number of children: 0   Years of education: 6 th   Highest education level: Not on file  Occupational History   Occupation: Disabled  Tobacco Use   Smoking status: Never   Smokeless tobacco: Former   Tobacco comments:    smoked one time  Media planner   Vaping Use: Never used  Substance and Sexual Activity   Alcohol use: Yes    Comment: 2 beers every other Friday and Saturday   Drug use: No   Sexual activity: Not on file  Other Topics Concern   Not on file  Social History Narrative   ** Merged History Encounter **       Patient lives in a [place].[phone]   Disabled.   Education- 6 th grade   Left handed.   Caffeine- coffee one cup daily.         Social Determinants of Health   Financial Resource Strain: Not on file  Food Insecurity: Not on file  Transportation Needs: Not on file  Physical Activity: Not on file  Stress: Not on file  Social Connections: Not on file    Allergies: Allergies  Allergen Reactions   Penicillins Hives, Nausea And Vomiting and Other (See Comments)    Patient tolerated cefazolin in 2017 Has patient had a PCN reaction causing immediate rash, facial/tongue/throat swelling, SOB or lightheadedness with hypotension: Yes Has patient had a PCN reaction causing severe rash involving mucus membranes or skin necrosis: No Has patient had a PCN reaction that required hospitalization No Has patient had a PCN reaction occurring within the last 10 years: Yes If all of the above answers are "NO", then may proceed with Cephalosporin use.   Sulfamethoxazole-Trimethoprim Nausea And  Vomiting   Metronidazole Nausea And Vomiting    Outpatient Meds: Current Outpatient Medications  Medication Sig Dispense Refill   Albuterol Sulfate (PROAIR RESPICLICK) 102 (90 Base) MCG/ACT AEPB Inhale into the lungs.     amLODipine (NORVASC) 10 MG tablet Take 10 mg by mouth daily.     aspirin 81 MG chewable tablet Chew by mouth daily.     baclofen (LIORESAL) 10 MG tablet Take 5 mg by mouth at bedtime.      budesonide-formoterol (SYMBICORT) 80-4.5 MCG/ACT inhaler Inhale 2 puffs into the lungs 2 (two) times daily.     [employer] CLOTRIMAZOLE 1 % cream Apply 1 application topically 2 (two) times daily as needed (rash). BETWEEN TOES  1   docusate sodium (COLACE) 100 MG capsule Take 1 capsule (100 mg total) by  mouth every 12 (twelve) hours. (Patient taking differently: Take 100 mg by mouth as needed.) 60 capsule 0   doxycycline (VIBRAMYCIN) 100 MG capsule 1 tablet 2 (two) times daily.     Dulaglutide 0.75 MG/0.5ML SOPN Inject 0.75 mg into the skin once a week. Tuesdays     empagliflozin (JARDIANCE) 25 MG TABS tablet Take one tablet (25 mg dose) by mouth daily.     furosemide (LASIX) 20 MG tablet Take 1 tablet by mouth daily.     latanoprost (XALATAN) 0.005 % ophthalmic solution Place 1 drop into both eyes daily as needed (dry eyes).      latanoprost (XALATAN) 0.005 % ophthalmic solution Place one drop into both eyes daily.     metFORMIN (GLUCOPHAGE) 500 MG tablet Take 250 mg by mouth daily.     metoprolol succinate (TOPROL-XL) 25 MG 24 hr tablet Take by mouth.     montelukast (SINGULAIR) 10 MG tablet Take 10 mg by mouth daily.      montelukast (SINGULAIR) 10 MG tablet Take 1 tablet by mouth daily.     mupirocin ointment (BACTROBAN) 2 % Apply to left great toe nailbed once daily for 3 weeks. 30 g 1   naproxen sodium (ALEVE) 220 MG tablet Take 220-440 mg by mouth 2 (two) times daily as needed (for pain).     nitroGLYCERIN (NITROSTAT) 0.4 MG SL tablet Place 0.4 mg under the tongue every 5 (five) minutes  as needed for chest pain.      nitroGLYCERIN (NITROSTAT) 0.4 MG SL tablet Place under the tongue.     omeprazole (PRILOSEC) 20 MG capsule Take 1 capsule by mouth every morning.     potassium chloride (KLOR-CON) 10 MEQ tablet Take 1 tablet by mouth daily.     rosuvastatin (CRESTOR) 5 MG tablet Take 5 mg by mouth at bedtime.     timolol (TIMOPTIC) 0.5 % ophthalmic solution 1 drop 2 (two) times daily.     tolnaftate (TINACTIN) 1 % cream Apply topically 2 (two) times daily.     valsartan (DIOVAN) 80 MG tablet Take 80 mg by mouth daily.     No current facility-administered medications for this visit.      ___________________________________________________________________ Objective   Exam:  BP 128/84 (BP Location: Left Arm, Patient Position: Sitting, Cuff Size: Normal)    Pulse (!) 103    SpO2 96%  Wt Readings from Last 3 Encounters:  09/20/20 154 lb 5.2 oz (70 kg)  04/25/20 145 lb (65.8 kg)  01/08/20 190 lb (86.2 kg)    General: Wheelchair-bound, which he manages himself with motorized controls. His affect is not as Ramos as I typically see from him.  He also appears unkempt today, with stains on his clothing and a tremendous amount of food related grime all over the left side of his wheelchair. Eyes: sclera anicteric, no redness ENT: Oropharynx not well visualized. CV: RRR without murmur, heart sounds distant no JVD, no peripheral edema Resp: clear to auscultation bilaterally, normal RR and fair inspiratory effort noted GI: soft, no tenderness, with active bowel sounds.  Limited exam with body habitus and in wheelchair Skin; warm and dry, no rash or jaundice noted Altered vocal quality as before, contractures right side  Labs: Recent data  Assessment: Encounter Diagnoses  Name Primary?   Chronic diarrhea Yes   Esophageal dysphagia    Chronic esophageal dysphagia from large hiatal hernia and resultant distal esophageal tortuosity.  No stricture on last endoscopy with me June  2019 (Dr.  Deatra Ina previously was performing Botox at the EG junction out of concern for achalasia). Chronic diarrhea, very difficult to characterize, still suspect it may have a lot to do with his metformin.  He is typically tended more toward chronic constipation due to his overall condition and immobility, but he tells me that the diarrhea is more of a problem since I last saw him.  I am also concerned about his social situation and that he is not being properly cared for at home.  Thomas Ramos gave [place] the number of an individual that he reports is the supervisor for his caregiver.  We will attempt to contact them, see what they know of Thomas Ramos situation and request a home visit to make sure Thomas Ramos is safe and not being neglected.  I think the nature and duration of the symptoms he has had warrant endoscopic testing.  Naturally, this will be a significant challenge given his overall condition, and will certainly require elective hospitalization to the medical service given his complexity and need for great assistance with bowel preparation  Plan:  We have tentatively put [place] my schedule for an upper endoscopy and colonoscopy on April 11.  We will make arrangements ahead of that date to have him electively admitted to the internal medicine service at [facility], most likely the day prior.  He can then get assistance with bowel preparation and undergo the procedures with me, with plans for discharge that day or the next. He was agreeable after thorough discussion procedure and risks.  The benefits and risks of the planned procedure were described in detail with the patient or (when appropriate) their health care proxy.  Risks were outlined as including, but not limited to, bleeding, infection, perforation, adverse medication reaction leading to cardiac or pulmonary decompensation, pancreatitis (if ERCP).  The limitation of incomplete mucosal visualization was also discussed.  No guarantees or warranties were  given.   45 minutes were spent on this encounter (including chart review, history/exam, counseling/coordination of care, and documentation) > 50% of that time was spent on counseling and coordination of care.   Thomas Ramos  CC: Referring provider noted above   Addendum 05/19/21 After clinic discussion with staff, chart note placed by Magdalene River, CMA:  "Thomas Ramos was seen in the office on 05-15-2021. I noticed that Thomas Ramos was a dis shuffled and had poor hygiene. He is typically well kept on his previous office visits. 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 Thomas Ramos will contact the in home care provider to discuss. She did ask me to forward all the communications for the EGD/Colon to her office.    Contact information to Thomas Ramos  Ph- [phone] Fx- [phone]"   H. Loletha Carrow, MD

## 2021-05-16 ENCOUNTER — Telehealth: Payer: Self-pay

## 2021-05-16 NOTE — Telephone Encounter (Signed)
Thomas Thomas Ramos was seen in the office on 05-15-2021. I noticed that Thomas Ramos was a dis shuffled and had poor hygiene. He is typically well kept on his previous office visits. 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 Thomas Ramos will contact the in home care provider to discuss. She did ask me to forward all the communications for the EGD/Colon to her office.  ? ?Contact information to [facility]  ?Ph- [phone] ?Fx- [phone] ? ?

## 2021-05-19 ENCOUNTER — Telehealth: Payer: Self-pay | Admitting: Gastroenterology

## 2021-05-19 DIAGNOSIS — R1319 Other dysphagia: Secondary | ICD-10-CM

## 2021-05-19 DIAGNOSIS — K529 Noninfective gastroenteritis and colitis, unspecified: Secondary | ICD-10-CM

## 2021-05-19 DIAGNOSIS — K449 Diaphragmatic hernia without obstruction or gangrene: Secondary | ICD-10-CM

## 2021-05-19 MED ORDER — NA SULFATE-K SULFATE-MG SULF 17.5-3.13-1.6 GM/177ML PO SOLN: 1.0000 | Freq: Once | ORAL | 0 refills | Status: AC

## 2021-05-19 NOTE — Telephone Encounter (Signed)
Spoke to pharmacy. All questions have been answered.  ?

## 2021-05-19 NOTE — Telephone Encounter (Signed)
Thank you for working on that. ? ?- HD ?

## 2021-05-19 NOTE — Telephone Encounter (Signed)
[facility], [phone], called and said they did not understand the prescription you sent over for patient's prep. Please call and advise.  Thank you. ?

## 2021-05-26 ENCOUNTER — Ambulatory Visit: Payer: Medicare Other | Admitting: Podiatrist

## 2021-06-04 ENCOUNTER — Ambulatory Visit: Payer: Medicare Other | Admitting: Pulmonary Disease

## 2021-06-04 ENCOUNTER — Other Ambulatory Visit: Payer: Self-pay

## 2021-06-13 ENCOUNTER — Ambulatory Visit (INDEPENDENT_AMBULATORY_CARE_PROVIDER_SITE_OTHER): Payer: Medicare Other | Admitting: Primary Care

## 2021-06-13 ENCOUNTER — Encounter: Payer: Self-pay | Admitting: Primary Care

## 2021-06-13 DIAGNOSIS — G4733 Obstructive sleep apnea (adult) (pediatric): Secondary | ICD-10-CM | POA: Diagnosis not present

## 2021-06-13 NOTE — Progress Notes (Signed)
Reviewed and agree with assessment/plan. ? ? ?Chesley Mires, MD ?[place] ?06/13/2021, 3:28 PM ?Pager:  [phone] ? ?

## 2021-06-13 NOTE — Patient Instructions (Addendum)
Recommendations: ?Aim to wear CPAP every night ? ?Orders: ?Adjust CPAP pressure 5-10cm h20 ? ?Follow-up: ?3 months with DR. Sood  ? ?CPAP and BIPAP Information ?CPAP and BIPAP are methods that use air pressure to keep your airways open and to help you breathe well. CPAP and BIPAP use different amounts of pressure. Your health care provider will tell you whether CPAP or BIPAP would be more helpful for you. ?CPAP stands for "continuous positive airway pressure." With CPAP, the amount of pressure stays the same while you breathe in (inhale) and out (exhale). ?BIPAP stands for "bi-level positive airway pressure." With BIPAP, the amount of pressure will be higher when you inhale and lower when you exhale. This allows you to take larger breaths. ?CPAP or BIPAP may be used in the hospital, or your health care provider may want you to use it at home. You may need to have a sleep study before your health care provider can order a machine for you to use at home. ?What are the advantages? ?CPAP or BIPAP can be helpful if you have: ?Sleep apnea. ?Chronic obstructive pulmonary disease (COPD). ?Heart failure. ?Medical conditions that cause muscle weakness, including muscular dystrophy or amyotrophic lateral sclerosis (ALS). ?Other problems that cause breathing to be shallow, weak, abnormal, or difficult. ?CPAP and BIPAP are most commonly used for obstructive sleep apnea (OSA) to keep the airways from collapsing when the muscles relax during sleep. ?What are the risks? ?Generally, this is a safe treatment. However, problems may occur, including: ?Irritated skin or skin sores if the mask does not fit properly. ?Dry or stuffy nose or nosebleeds. ?Dry mouth. ?Feeling gassy or bloated. ?Sinus or lung infection if the equipment is not cleaned properly. ?When should CPAP or BIPAP be used? ?In most cases, the mask only needs to be worn during sleep. Generally, the mask needs to be worn throughout the night and during any daytime naps.  People with certain medical conditions may also need to wear the mask at other times, such as when they are awake. Follow instructions from your health care provider about when to use the machine. ?What happens during CPAP or BIPAP? ?Both CPAP and BIPAP are provided by a small machine with a flexible plastic tube that attaches to a plastic mask that you wear. Air is blown through the mask into your nose or mouth. The amount of pressure that is used to blow the air can be adjusted on the machine. Your health care provider will set the pressure setting and help you find the best mask for you. ?Tips for using the mask ?Because the mask needs to be snug, some people feel trapped or closed-in (claustrophobic) when first using the mask. If you feel this way, you may need to get used to the mask. One way to do this is to hold the mask loosely over your nose or mouth and then gradually apply the mask more snugly. You can also gradually increase the amount of time that you use the mask. ?Masks are available in various types and sizes. If your mask does not fit well, talk with your health care provider about getting a different one. Some common types of masks include: ?Full face masks, which fit over the mouth and nose. ?Nasal masks, which fit over the nose. ?Nasal pillow or prong masks, which fit into the nostrils. ?If you are using a mask that fits over your nose and you tend to breathe through your mouth, a chin strap may be applied to  help keep your mouth closed. ?Use a skin barrier to protect your skin as told by your health care provider. ?Some CPAP and BIPAP machines have alarms that may sound if the mask comes off or develops a leak. ?If you have trouble with the mask, it is very important that you talk with your health care provider about finding a way to make the mask easier to tolerate. Do not stop using the mask. There could be a negative impact on your health if you stop using the mask. ?Tips for using the  machine ?Place your CPAP or BIPAP machine on a secure table or stand near an electrical outlet. ?Know where the on/off switch is on the machine. ?Follow instructions from your health care provider about how to set the pressure on your machine and when you should use it. ?Do not eat or drink while the CPAP or BIPAP machine is on. Food or fluids could get pushed into your lungs by the pressure of the CPAP or BIPAP. ?For home use, CPAP and BIPAP machines can be rented or purchased through home health care companies. Many different brands of machines are available. Renting a machine before purchasing may help you find out which particular machine works well for you. Your health insurance company may also decide which machine you may get. ?Keep the CPAP or BIPAP machine and attachments clean. Ask your health care provider for specific instructions. ?Check the humidifier if you have a dry stuffy nose or nosebleeds. Make sure it is working correctly. ?Follow these instructions at home: ?Take over-the-counter and prescription medicines only as told by your health care provider. Ask if you can take sinus medicine if your sinuses are blocked. ?Do not use any products that contain nicotine or tobacco. These products include cigarettes, chewing tobacco, and vaping devices, such as e-cigarettes. If you need help quitting, ask your health care provider. ?Keep all follow-up visits. This is important. ?Contact a health care provider if: ?You have redness or pressure sores on your head, face, mouth, or nose from the mask or head gear. ?You have trouble using the CPAP or BIPAP machine. ?You cannot tolerate wearing the CPAP or BIPAP mask. ?Someone tells you that you snore even when wearing your CPAP or BIPAP. ?Get help right away if: ?You have trouble breathing. ?You feel confused. ?Summary ?CPAP and BIPAP are methods that use air pressure to keep your airways open and to help you breathe well. ?If you have trouble with the mask, it  is very important that you talk with your health care provider about finding a way to make the mask easier to tolerate. Do not stop using the mask. There could be a negative impact to your health if you stop using the mask. ?Follow instructions from your health care provider about when to use the machine. ?This information is not intended to replace advice given to you by your health care provider. Make sure you discuss any questions you have with your health care provider. ?Document Revised: 10/09/2020 Document Reviewed: 02/09/2020 ?Elsevier Patient Education ? 2022 [place]. ? ? ?

## 2021-06-13 NOTE — Progress Notes (Signed)
? ?'@Patient'$  ID: Thomas Ramos, male    DOB: [birth year], 52 y.o.   MRN: [id number] ? ?Chief Complaint  ?Patient presents with  ? Follow-up  ? ? ?Referring provider: ?Chesley Noon, MD ? ?HPI: ?51 year old male, never smoker. PMH significant for OSA, tachycardia. Patient of Dr. Halford Chessman, last seen on 05/10/20.  ? ?06/13/2021 ?Patient presents today for annual follow-up/OSA.  ?Doing alright, no acute concerns.  ?He has not been wearing his CPAP lately.  ?Feels pressure is too strong coming from his CPAP.  ?He wears nasal mask, has a beard. Mouth breathing. Having some airleaks.  ?Denies shortness of breath or cough.  ? ? ?[facility] download 03/13/21-06/10/21 ?Usage 1/90 days; 0 days > 4 hours ?Pressure 5-15cm h20 (6.1cm h20-95%) ?Airleaks 49.2L/min (95%)  ?AHI 2.4/hr  ? ? ?Allergies  ?Allergen Reactions  ? Penicillins Hives, Nausea And Vomiting and Other (See Comments)  ?  Patient tolerated cefazolin in 2017 ?Has patient had a PCN reaction causing immediate rash, facial/tongue/throat swelling, SOB or lightheadedness with hypotension: Yes ?Has patient had a PCN reaction causing severe rash involving mucus membranes or skin necrosis: No ?Has patient had a PCN reaction that required hospitalization No ?Has patient had a PCN reaction occurring within the last 10 years: Yes ?If all of the above answers are "NO", then may proceed with Cephalosporin use.  ? Sulfamethoxazole-Trimethoprim Nausea And Vomiting  ? Metronidazole Nausea And Vomiting  ? ? ?Immunization History  ?Administered Date(s) Administered  ? Influenza, Seasonal, Injecte, Preservative Fre 12/13/2014, 12/09/2015  ? Influenza-Unspecified 12/15/2015, 12/16/2017, 12/29/2018, 01/06/2021  ? PFIZER(Purple Top)SARS-COV-2 Vaccination 05/05/2019, 05/26/2019  ? Tdap 07/09/2015  ? ? ?Past Medical History:  ?Diagnosis Date  ? Anxiety   ? Asthma   ? Cellulitis 08/04/2017  ? Cerebral palsy ([place])   ? Chronic diastolic (congestive) heart failure ([facility])   ? CP (cerebral palsy) ([place])   ?  Diabetes mellitus without complication ([place])   ? borderline  ? Drug induced constipation   ? Environmental allergies   ? takes inhalers if needed  ? Esophageal stricture   ? GERD (gastroesophageal reflux disease)   ? Hypertension   ? Motility disorder, esophageal   ? Pneumonia   ? 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 S/P Botox injection   ? approx every 4 months  ? Seasonal allergies   ? ? ?Tobacco History: ?Social History  ? ?Tobacco Use  ?Smoking Status Never  ?Smokeless Tobacco Former  ?Tobacco Comments  ? smoked one time  ? ?Counseling given: Not Answered ?Tobacco comments: smoked one time ? ? ?Outpatient Medications Prior to Visit  ?Medication Sig Dispense Refill  ? Albuterol Sulfate (PROAIR RESPICLICK) 283 (90 Base) MCG/ACT AEPB Inhale into the lungs.    ? amLODipine (NORVASC) 10 MG tablet Take 10 mg by mouth daily.    ? aspirin 81 MG chewable tablet Chew by mouth daily.    ? baclofen (LIORESAL) 10 MG tablet Take 5 mg by mouth at bedtime.     ? budesonide-formoterol (SYMBICORT) 80-4.5 MCG/ACT inhaler Inhale 2 puffs into the lungs 2 (two) times daily.    ? [facility] CLOTRIMAZOLE 1 % cream Apply 1 application topically 2 (two) times daily as needed (rash). BETWEEN TOES  1  ? docusate sodium (COLACE) 100 MG capsule Take 1 capsule (100 mg total) by mouth every 12 (twelve) hours. (Patient taking differently: Take 100 mg by mouth as needed.) 60 capsule 0  ? Dulaglutide 0.75 MG/0.5ML SOPN Inject 0.75 mg into the  skin once a week. Tuesdays    ? empagliflozin (JARDIANCE) 25 MG TABS tablet Take one tablet (25 mg dose) by mouth daily.    ? furosemide (LASIX) 20 MG tablet Take 1 tablet by mouth daily.    ? latanoprost (XALATAN) 0.005 % ophthalmic solution Place 1 drop into both eyes daily as needed (dry eyes).     ? latanoprost (XALATAN) 0.005 % ophthalmic solution Place one drop into both eyes daily.    ? metoprolol succinate (TOPROL-XL) 25 MG 24 hr tablet Take by mouth.    ? montelukast (SINGULAIR) 10 MG tablet Take 1  tablet by mouth daily.    ? mupirocin ointment (BACTROBAN) 2 % Apply to left great toe nailbed once daily for 3 weeks. 30 g 1  ? naproxen sodium (ALEVE) 220 MG tablet Take 220-440 mg by mouth 2 (two) times daily as needed (for pain).    ? nitroGLYCERIN (NITROSTAT) 0.4 MG SL tablet Place 0.4 mg under the tongue every 5 (five) minutes as needed for chest pain.     ? nitroGLYCERIN (NITROSTAT) 0.4 MG SL tablet Place under the tongue.    ? potassium chloride (KLOR-CON) 10 MEQ tablet Take 1 tablet by mouth daily.    ? rosuvastatin (CRESTOR) 5 MG tablet Take 5 mg by mouth at bedtime.    ? timolol (TIMOPTIC) 0.5 % ophthalmic solution 1 drop 2 (two) times daily.    ? valsartan (DIOVAN) 80 MG tablet Take 80 mg by mouth daily.    ? montelukast (SINGULAIR) 10 MG tablet Take 10 mg by mouth daily.     ? doxycycline (VIBRAMYCIN) 100 MG capsule 1 tablet 2 (two) times daily. (Patient not taking: Reported on 06/13/2021)    ? metFORMIN (GLUCOPHAGE) 500 MG tablet Take 250 mg by mouth daily. (Patient not taking: Reported on 06/13/2021)    ? omeprazole (PRILOSEC) 20 MG capsule Take 1 capsule by mouth every morning. (Patient not taking: Reported on 06/13/2021)    ? tolnaftate (TINACTIN) 1 % cream Apply topically 2 (two) times daily. (Patient not taking: Reported on 06/13/2021)    ? ?No facility-administered medications prior to visit.  ? ? ?Review of Systems ? ?Review of Systems  ?Constitutional: Negative.   ?HENT: Negative.    ?Respiratory: Negative.    ?Cardiovascular: Negative.   ? ? ?Physical Exam ? ?BP 134/82 (BP Location: Left Arm, Cuff Size: Normal)   Pulse 95   SpO2 96%  ?Physical Exam ?Constitutional:   ?   Appearance: Normal appearance.  ?HENT:  ?   Head: Normocephalic and atraumatic.  ?   Mouth/Throat:  ?   Mouth: Mucous membranes are moist.  ?   Pharynx: Oropharynx is clear.  ?Cardiovascular:  ?   Rate and Rhythm: Normal rate and regular rhythm.  ?Pulmonary:  ?   Effort: Pulmonary effort is normal.  ?   Breath sounds: No  wheezing, rhonchi or rales.  ?Musculoskeletal:     ?   General: Normal range of motion.  ?Skin: ?   General: Skin is warm and dry.  ?Neurological:  ?   General: No focal deficit present.  ?   Mental Status: He is alert and oriented to person, place, and time. Mental status is at baseline.  ?Psychiatric:     ?   Mood and Affect: Mood normal.     ?   Behavior: Behavior normal.     ?   Thought Content: Thought content normal.     ?   Judgment:  Judgment normal.  ?  ? ?Lab Results: ? ?CBC ?   ?Component Value Date/Time  ? WBC 4.7 09/22/2020 1824  ? RBC 5.01 09/22/2020 1824  ? HGB 13.6 09/22/2020 1824  ? HCT 41.5 09/22/2020 1824  ? PLT 170 09/22/2020 1824  ? MCV 82.8 09/22/2020 1824  ? MCH 27.1 09/22/2020 1824  ? MCHC 32.8 09/22/2020 1824  ? RDW 13.2 09/22/2020 1824  ? LYMPHSABS 1.2 09/22/2020 1824  ? MONOABS 0.3 09/22/2020 1824  ? EOSABS 0.1 09/22/2020 1824  ? BASOSABS 0.0 09/22/2020 1824  ? ? ?BMET ?   ?Component Value Date/Time  ? NA 142 09/22/2020 1824  ? K 4.7 09/22/2020 1824  ? CL 108 09/22/2020 1824  ? CO2 26 09/22/2020 1824  ? GLUCOSE 116 (H) 09/22/2020 1824  ? BUN 17 09/22/2020 1824  ? CREATININE 0.84 09/22/2020 1824  ? CREATININE 0.65 03/19/2016 1715  ? CALCIUM 8.9 09/22/2020 1824  ? GFRNONAA >60 09/22/2020 1824  ? [facility] >89 03/19/2016 1715  ? GFRAA >60 03/27/2019 2125  ? GFRAA >89 03/19/2016 1715  ? ? ?BNP ?   ?Component Value Date/Time  ? BNP 19.5 09/16/2017 2221  ? ? ?ProBNP ?No results found for: PROBNP ? ?Imaging: ?No results found. ? ? ?Assessment & Plan:  ? ?OSA (obstructive sleep apnea) ?- Not consistently wearing CPAP. Reinforced importance of use. Advised he aim to wear every night min 4-6 hours or longer. Adjusting CPAP pressure 5-10cm h20. If still having issues with pressure setting/airleaks recommend mask fitting with [facility]. Transportation is not easily available to patient. FU in 3 months or sooner if needed.  ? ? ?Martyn Ehrich, NP ?06/13/2021 ? ?

## 2021-06-13 NOTE — Addendum Note (Signed)
Addended by: Konrad Felix L on: 06/13/2021 03:05 PM ? ? Modules accepted: Orders ? ?

## 2021-06-13 NOTE — Assessment & Plan Note (Signed)
-   Not consistently wearing CPAP. Reinforced importance of use. Advised he aim to wear every night min 4-6 hours or longer. Adjusting CPAP pressure 5-10cm h20. If still having issues with pressure setting/airleaks recommend mask fitting with [facility]. Transportation is not easily available to patient. FU in 3 months or sooner if needed.  ?

## 2021-06-16 ENCOUNTER — Telehealth: Payer: Self-pay | Admitting: Gastroenterology

## 2021-06-16 ENCOUNTER — Telehealth: Payer: Self-pay | Admitting: Pulmonary Disease

## 2021-06-16 NOTE — Telephone Encounter (Signed)
Patient will be given the prep in the hospital. The Rx was supposed to be cancelled. ?

## 2021-06-16 NOTE — Telephone Encounter (Signed)
Called patient but he did not answer. Left message for him to call back.  

## 2021-06-16 NOTE — Telephone Encounter (Signed)
Patient called regarding upcoming hospital procedure on 4/11.  He said he called the hospital because he is supposed to be there on 4/10 so they can administer prep; however, he says they don't know anything about it.  Can you please call patient and advise?  Thank you. ?

## 2021-06-16 NOTE — Telephone Encounter (Signed)
Inbound call from pharmacy stating that [facility] is not covered by patient insurance and is needing an alternative prep for his procedure on 4/11. Please advise.    ?

## 2021-06-16 NOTE — Telephone Encounter (Signed)
Spoke to Mr Thomas Ramos with [name] on speaker. I explained that the hospital will call when they have a bed open, unfortunately there is no way to know the time. Mr Thomas Ramos stated understanding.  ?

## 2021-06-16 NOTE — Telephone Encounter (Signed)
Patient family member Aggie Moats) called to get clarification for [facility] admission for 06/24/21.  ? ?Patient requires transportation from [facility] due to being in a wheelchair. In order to use [facility] transportation, the patient has to call one day prior to schedule transportation. Patient states that they want him to show up on Monday 06/23/21 so they can prep. No one has told him at what time to arrive/ and or how long he should expect to be there (to let [facility] know).  ? ?Aggie Moats # [phone] ? ?Please advise  ?

## 2021-06-18 ENCOUNTER — Inpatient Hospital Stay: Admit: 2021-06-18 | Payer: Medicare Other | Admitting: Gastroenterology

## 2021-06-18 ENCOUNTER — Telehealth: Payer: Self-pay

## 2021-06-18 NOTE — Telephone Encounter (Signed)
Admission is pending for 06/23/21. Pt will be dropped off at [facility] by [facility] bus that morning. The hospital will contact him/pt's caregivers directly regarding exact time of admission. ?

## 2021-06-18 NOTE — Telephone Encounter (Signed)
-----   Message from Doran Stabler, MD sent at 06/18/2021  7:14 AM EDT ----- ?Regarding: Elective admission next week ?Ages, ? ?Please see my recent clinic note on this patient. ? ?Mark is wheelchair-bound with cerebral palsy and has other medical conditions as well. ? ?He is currently on my schedule for EGD and colonoscopy for dysphagia and diarrhea at [facility] on 06/24/2021.  He requires admission to the internal medicine ([facility] hospital[facility]) service the morning of 2021-07-19 for medical management and bowel preparation prior to those procedures. ? ?Please contact the [facility] long admitting office to ask how a bed can be held that day to make such arrangements.  The earlier in the day he can arrive for admission the better, and that information needs to be provided to his caregivers for transportation. ? ?Vivien Rota is also familiar with [facility] and can provide more information. ? ?I will be out of the office through 07/20/22 after a death in the family, returning to work on April 11th.  So I would like to be sure these arrangements are made for [facility] so as not to lose our slots for his important endoscopic procedures. ? ?Thank you ? ?HD ? ?

## 2021-06-19 ENCOUNTER — Telehealth: Payer: Self-pay | Admitting: Pulmonary Disease

## 2021-06-19 DIAGNOSIS — K219 Gastro-esophageal reflux disease without esophagitis: Secondary | ICD-10-CM

## 2021-06-19 NOTE — Telephone Encounter (Signed)
I called and spoke with the pt  ?He states since starting CPAP he has cough at night  ?If he takes off his mask he stops coughing  ?Cough is non prod  ?He understands importance of using machine at least 4 hours to keep compliant ?Beth, any recommendations regarding cough? Thanks! ?

## 2021-06-19 NOTE — Telephone Encounter (Signed)
He did not mention cough during our visit. Cough is likely related to PND or GERD. Would recommend he start using fluticasone (flonase) nasal spray at bedtime and start famotidine '20mg'$  at bedtime. He can also take delsym cough syrup every 12 hours as needed for cough. Follow-up if cough does not improve in office.

## 2021-06-19 NOTE — Telephone Encounter (Signed)
Returned call to pt. I informed him that we did not call him today. I told him that it looks like the Pulmonary office tried to reach him earlier this week. I told him that he may want to check with them. He is aware that he will be admitted on Monday and they will call him with a specific time. Pt verbalized understanding and had no concerns at the end of the call. ?

## 2021-06-19 NOTE — Telephone Encounter (Signed)
Called the pt and there was no answer- LMTCB    

## 2021-06-20 MED ORDER — FAMOTIDINE 20 MG PO TABS: 20.0000 mg | ORAL_TABLET | Freq: Two times a day (BID) | ORAL | 0 refills | Status: DC

## 2021-06-20 MED ORDER — FAMOTIDINE 20 MG PO TABS: 20.0000 mg | ORAL_TABLET | Freq: Every day | ORAL | 0 refills | Status: DC

## 2021-06-20 MED ORDER — DELSYM COUGH + SORE THROAT 650-20 MG/20ML PO LIQD: 20.0000 mL | Freq: Two times a day (BID) | ORAL | 0 refills | Status: DC

## 2021-06-20 MED ORDER — DEXTROMETHORPHAN HBR 15 MG/5ML PO SYRP: 10.0000 mL | ORAL_SOLUTION | Freq: Four times a day (QID) | ORAL | 0 refills | Status: DC | PRN

## 2021-06-20 MED ORDER — FLUTICASONE PROPIONATE 50 MCG/ACT NA SUSP: 2.0000 | Freq: Every day | NASAL | 2 refills | Status: DC

## 2021-06-20 NOTE — Telephone Encounter (Signed)
Called patient and informed him of Beths recommendations. Medications sent in per patiens request. Nothing further needed  ?

## 2021-06-20 NOTE — Addendum Note (Signed)
Addended by: Retia Passe on: 06/20/2021 02:43 PM ? ? Modules accepted: Orders ? ?

## 2021-06-23 ENCOUNTER — Observation Stay (HOSPITAL_COMMUNITY)
Admission: RE | Admit: 2021-06-23 | Discharge: 2021-06-24 | Disposition: A | Discharge status: Home / Self Care | Payer: Medicare Other | Source: Ambulatory Visit | Attending: Family Medicine | Admitting: Family Medicine

## 2021-06-23 ENCOUNTER — Encounter: Payer: Self-pay | Admitting: *Deleted

## 2021-06-23 ENCOUNTER — Telehealth: Payer: Self-pay

## 2021-06-23 DIAGNOSIS — Z7982 Long term (current) use of aspirin: Secondary | ICD-10-CM | POA: Insufficient documentation

## 2021-06-23 DIAGNOSIS — K6389 Other specified diseases of intestine: Secondary | ICD-10-CM | POA: Insufficient documentation

## 2021-06-23 DIAGNOSIS — E119 Type 2 diabetes mellitus without complications: Secondary | ICD-10-CM | POA: Insufficient documentation

## 2021-06-23 DIAGNOSIS — K529 Noninfective gastroenteritis and colitis, unspecified: Secondary | ICD-10-CM

## 2021-06-23 DIAGNOSIS — K449 Diaphragmatic hernia without obstruction or gangrene: Secondary | ICD-10-CM | POA: Insufficient documentation

## 2021-06-23 DIAGNOSIS — K5730 Diverticulosis of large intestine without perforation or abscess without bleeding: Secondary | ICD-10-CM | POA: Diagnosis not present

## 2021-06-23 DIAGNOSIS — Z1211 Encounter for screening for malignant neoplasm of colon: Secondary | ICD-10-CM | POA: Diagnosis not present

## 2021-06-23 DIAGNOSIS — Z79899 Other long term (current) drug therapy: Secondary | ICD-10-CM | POA: Insufficient documentation

## 2021-06-23 DIAGNOSIS — J45909 Unspecified asthma, uncomplicated: Secondary | ICD-10-CM | POA: Diagnosis not present

## 2021-06-23 DIAGNOSIS — I110 Hypertensive heart disease with heart failure: Secondary | ICD-10-CM | POA: Insufficient documentation

## 2021-06-23 DIAGNOSIS — I5030 Unspecified diastolic (congestive) heart failure: Secondary | ICD-10-CM | POA: Insufficient documentation

## 2021-06-23 DIAGNOSIS — R1319 Other dysphagia: Secondary | ICD-10-CM

## 2021-06-23 DIAGNOSIS — Z7984 Long term (current) use of oral hypoglycemic drugs: Secondary | ICD-10-CM | POA: Insufficient documentation

## 2021-06-23 DIAGNOSIS — K5939 Other megacolon: Principal | ICD-10-CM | POA: Insufficient documentation

## 2021-06-23 DIAGNOSIS — R197 Diarrhea, unspecified: Secondary | ICD-10-CM

## 2021-06-23 DIAGNOSIS — D123 Benign neoplasm of transverse colon: Secondary | ICD-10-CM | POA: Insufficient documentation

## 2021-06-23 DIAGNOSIS — Q399 Congenital malformation of esophagus, unspecified: Secondary | ICD-10-CM | POA: Diagnosis not present

## 2021-06-23 DIAGNOSIS — K219 Gastro-esophageal reflux disease without esophagitis: Secondary | ICD-10-CM | POA: Diagnosis not present

## 2021-06-23 LAB — CBC WITH DIFFERENTIAL/PLATELET
Abs Immature Granulocytes: 0.04 10*3/uL (ref 0.00–0.07)
Basophils Absolute: 0 10*3/uL (ref 0.0–0.1)
Basophils Relative: 0 %
Eosinophils Absolute: 0.1 10*3/uL (ref 0.0–0.5)
Eosinophils Relative: 1 %
HCT: 43.7 % (ref 39.0–52.0)
Hemoglobin: 14.5 g/dL (ref 13.0–17.0)
Immature Granulocytes: 1 %
Lymphocytes Relative: 23 %
Lymphs Abs: 1.7 10*3/uL (ref 0.7–4.0)
MCH: 27.3 pg (ref 26.0–34.0)
MCHC: 33.2 g/dL (ref 30.0–36.0)
MCV: 82.3 fL (ref 80.0–100.0)
Monocytes Absolute: 0.5 10*3/uL (ref 0.1–1.0)
Monocytes Relative: 7 %
Neutro Abs: 4.9 10*3/uL (ref 1.7–7.7)
Neutrophils Relative %: 68 %
Platelets: 213 10*3/uL (ref 150–400)
RBC: 5.31 MIL/uL (ref 4.22–5.81)
RDW: 12.4 % (ref 11.5–15.5)
WBC: 7.3 10*3/uL (ref 4.0–10.5)
nRBC: 0 % (ref 0.0–0.2)

## 2021-06-23 LAB — BASIC METABOLIC PANEL
Anion gap: 5 (ref 5–15)
BUN: 16 mg/dL (ref 6–20)
CO2: 28 mmol/L (ref 22–32)
Calcium: 8.9 mg/dL (ref 8.9–10.3)
Chloride: 103 mmol/L (ref 98–111)
Creatinine, Ser: 0.78 mg/dL (ref 0.61–1.24)
GFR, Estimated: >60 mL/min (ref >60)
Glucose, Bld: 124 mg/dL — ABNORMAL HIGH (ref 70–99)
Potassium: 3.3 mmol/L — ABNORMAL LOW (ref 3.5–5.1)
Sodium: 136 mmol/L (ref 135–145)

## 2021-06-23 LAB — GLUCOSE, CAPILLARY
Glucose-Capillary: 139 mg/dL — ABNORMAL HIGH (ref 70–99)
Glucose-Capillary: 121 mg/dL — ABNORMAL HIGH (ref 70–99)

## 2021-06-23 MED ORDER — DEXTROSE 50 % IV SOLN: 12.5000 g | INTRAVENOUS | Status: DC

## 2021-06-23 MED ORDER — MONTELUKAST SODIUM 10 MG PO TABS
10.0000 mg | ORAL_TABLET | Freq: Every day | ORAL | Status: DC
  Administered 2021-06-23: 10 mg via ORAL
  Filled 2021-06-23: qty 1

## 2021-06-23 MED ORDER — PEG 3350-KCL-NABCB-NACL-NASULF 236 G PO SOLR
4000.0000 mL | Freq: Once | ORAL | Status: AC
  Administered 2021-06-23: 4000 mL via ORAL
  Filled 2021-06-23: qty 4000

## 2021-06-23 MED ORDER — BISACODYL 5 MG PO TBEC: 10.0000 mg | DELAYED_RELEASE_TABLET | Freq: Once | ORAL | Status: DC

## 2021-06-23 MED ORDER — BACLOFEN 10 MG PO TABS
5.0000 mg | ORAL_TABLET | Freq: Every day | ORAL | Status: DC
  Administered 2021-06-23: 5 mg via ORAL
  Filled 2021-06-23: qty 1

## 2021-06-23 MED ORDER — TIMOLOL MALEATE 0.5 % OP SOLN
1.0000 [drp] | Freq: Two times a day (BID) | OPHTHALMIC | Status: DC
  Administered 2021-06-23 – 2021-06-24 (×3): 1 [drp] via OPHTHALMIC
  Filled 2021-06-23: qty 5

## 2021-06-23 MED ORDER — NAPROXEN SODIUM 550 MG PO TABS
275.0000 mg | ORAL_TABLET | Freq: Two times a day (BID) | ORAL | Status: DC | PRN
  Filled 2021-06-23: qty 1

## 2021-06-23 MED ORDER — ONDANSETRON HCL 4 MG/2ML IJ SOLN
4.0000 mg | Freq: Four times a day (QID) | INTRAMUSCULAR | Status: DC | PRN
  Administered 2021-06-23: 4 mg via INTRAVENOUS
  Filled 2021-06-23 (×2): qty 2

## 2021-06-23 MED ORDER — DICLOFENAC SODIUM 1 % EX GEL
1.0000 "application " | Freq: Every day | CUTANEOUS | Status: DC | PRN
  Filled 2021-06-23: qty 100

## 2021-06-23 MED ORDER — POTASSIUM CHLORIDE 10 MEQ/100ML IV SOLN
10.0000 meq | INTRAVENOUS | Status: AC
  Administered 2021-06-23 (×2): 10 meq via INTRAVENOUS
  Filled 2021-06-23 (×2): qty 100

## 2021-06-23 MED ORDER — LATANOPROST 0.005 % OP SOLN
1.0000 [drp] | Freq: Every day | OPHTHALMIC | Status: DC | PRN
  Filled 2021-06-23: qty 2.5

## 2021-06-23 MED ORDER — ALBUTEROL SULFATE (2.5 MG/3ML) 0.083% IN NEBU: 2.5000 mL | INHALATION_SOLUTION | Freq: Four times a day (QID) | RESPIRATORY_TRACT | Status: DC | PRN

## 2021-06-23 NOTE — H&P (Signed)
?History and Physical  ? ? ?Patient: Thomas Ramos MVH:[id number] DOB: 09/12/1970 ?DOA: 06/23/2021 ?DOS: the patient was seen and examined on 06/23/2021 ?PCP: Aura Dials, PA-C  ?Patient coming from: Home ? ?Chief Complaint: Colonoscopy screening  ? ?HPI: Thomas Ramos is a 51 y.o. male with medical history significant of OSA, cerebral palsy, quadriplegic spinal paralysis, HFpEF, DM2, GERD admitted for observation for colonoscopy and EGD with GI.  ? ?Pt reports no complaints or concerns. Last ate solid food yesterday. He denies any fever, chills, chest pain, shortness of breath, chest pressure, swelling in his lower extremities, abdominal pain, nausea, vomiting, blood in stool, or any other concerns currently. ? ?Review of Systems: As mentioned in the history of present illness. All other systems reviewed and are negative. ?Past Medical History:  ?Diagnosis Date  ? Anxiety   ? Asthma   ? Cellulitis 08/04/2017  ? Cerebral palsy ([place])   ? Chronic diastolic (congestive) heart failure (HCC)   ? CP (cerebral palsy) ([place])   ? Diabetes mellitus without complication ([place])   ? borderline  ? Drug induced constipation   ? Environmental allergies   ? takes inhalers if needed  ? Esophageal stricture   ? GERD (gastroesophageal reflux disease)   ? Hypertension   ? Motility disorder, esophageal   ? Pneumonia   ? 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[place])   ? S/P Botox injection   ? approx every 4 months  ? Seasonal allergies   ? ?Past Surgical History:  ?Procedure Laterality Date  ? BOTOX INJECTION N/A 12/21/2012  ? Procedure: BOTOX INJECTION;  Surgeon: Inda Castle, MD;  Location: [facility];  Service: Endoscopy;  Laterality: N/A;  ? BOTOX INJECTION N/A 06/28/2014  ? Procedure: BOTOX INJECTION;  Surgeon: Inda Castle, MD;  Location: [place];  Service: Endoscopy;  Laterality: N/A;  ? ESOPHAGOGASTRODUODENOSCOPY N/A 12/21/2012  ? Procedure: ESOPHAGOGASTRODUODENOSCOPY (EGD);  Surgeon: Inda Castle, MD;  Location: [facility];   Service: Endoscopy;  Laterality: N/A;  ? ESOPHAGOGASTRODUODENOSCOPY N/A 06/28/2014  ? Procedure: ESOPHAGOGASTRODUODENOSCOPY (EGD);  Surgeon: Inda Castle, MD;  Location: [place];  Service: Endoscopy;  Laterality: N/A;  ? ESOPHAGOGASTRODUODENOSCOPY (EGD) WITH PROPOFOL N/A 09/03/2017  ? Procedure: ESOPHAGOGASTRODUODENOSCOPY (EGD) WITH PROPOFOL;  Surgeon: Doran Stabler, MD;  Location: [facility];  Service: Gastroenterology;  Laterality: N/A;  ? ESOPHAGUS SURGERY    ? stretched esophagus  ? legs    ? MOUTH SURGERY    ? TENDON RELEASE    ? ?Social History:  reports that he has never smoked. He has quit using smokeless tobacco. He reports current alcohol use. He reports that he does not use drugs. ? ?Allergies  ?Allergen Reactions  ? Penicillins Hives, Nausea And Vomiting and Other (See Comments)  ?  Patient tolerated cefazolin in 2017 ?Has patient had a PCN reaction causing immediate rash, facial/tongue/throat swelling, SOB or lightheadedness with hypotension: Yes ?Has patient had a PCN reaction causing severe rash involving mucus membranes or skin necrosis: No ?Has patient had a PCN reaction that required hospitalization No ?Has patient had a PCN reaction occurring within the last 10 years: Yes ?If all of the above answers are "NO", then may proceed with Cephalosporin use.  ? Sulfamethoxazole-Trimethoprim Nausea And Vomiting  ? Metronidazole Nausea And Vomiting  ? ? ?Family History  ?Problem Relation Age of Onset  ? Hypertension Father   ? Lung cancer Father   ? Diabetes Mother   ? ? ?Prior to Admission medications   ?Medication  Sig Start Date End Date Taking? Authorizing Provider  ?Albuterol Sulfate (PROAIR RESPICLICK) 076 (90 Base) MCG/ACT AEPB Inhale 2 puffs into the lungs every 6 (six) hours as needed (Shortness of breath). 05/03/20  Yes [provider]  ?amLODipine (NORVASC) 10 MG tablet Take 10 mg by mouth daily.   Yes [provider]  ?aspirin 81 MG chewable tablet Chew 81 mg by  mouth daily.   Yes [provider]  ?baclofen (LIORESAL) 10 MG tablet Take 2.5 mg by mouth at bedtime. 06/01/18  Yes [provider]  ?[facility] CLOTRIMAZOLE 1 % cream Apply 1 application topically 2 (two) times daily as needed (rash). BETWEEN TOES 08/19/17  Yes [provider]  ?diclofenac Sodium (VOLTAREN) 1 % GEL Apply 1 application. topically daily as needed for pain. 06/02/21  Yes [provider]  ?Dulaglutide 0.75 MG/0.5ML SOPN Inject 0.75 mg into the skin once a week. Tuesdays 02/26/20  Yes [provider]  ?empagliflozin (JARDIANCE) 25 MG TABS tablet 25 mg in the morning. 10/23/20  Yes [provider]  ?furosemide (LASIX) 20 MG tablet Take 20 mg by mouth daily. 10/23/20  Yes [provider]  ?latanoprost (XALATAN) 0.005 % ophthalmic solution Place 1 drop into both eyes daily as needed (eye pressure).   Yes [provider]  ?metoprolol succinate (TOPROL-XL) 25 MG 24 hr tablet Take 25 mg by mouth daily. 02/13/21  Yes [provider]  ?montelukast (SINGULAIR) 10 MG tablet Take 10 mg by mouth at bedtime. 08/28/20  Yes [provider]  ?naproxen sodium (ALEVE) 220 MG tablet Take 220-440 mg by mouth 2 (two) times daily as needed (for pain).   Yes [provider]  ?nitroGLYCERIN (NITROSTAT) 0.4 MG SL tablet Place 0.4 mg under the tongue every 5 (five) minutes as needed for chest pain.    Yes [provider]  ?omeprazole (PRILOSEC) 20 MG capsule Take 1 capsule by mouth every morning. 12/12/19  Yes [provider]  ?rosuvastatin (CRESTOR) 5 MG tablet Take 5 mg by mouth at bedtime.   Yes [provider]  ?timolol (TIMOPTIC) 0.5 % ophthalmic solution Place 1 drop into both eyes 2 (two) times daily. 04/10/20  Yes [provider]  ?valsartan (DIOVAN) 80 MG tablet Take 80 mg by mouth daily.   Yes [provider]  ?dextromethorphan 15 MG/5ML syrup Take 10 mLs (30 mg total) by mouth 4 (four) times  daily as needed for cough. 06/20/21   Martyn Ehrich, NP  ?docusate sodium (COLACE) 100 MG capsule Take 1 capsule (100 mg total) by mouth every 12 (twelve) hours. ?Patient not taking: Reported on 06/17/2021 05/04/20   Wieters, Madelynn Done C, PA-C  ?famotidine (PEPCID) 20 MG tablet Take 1 tablet (20 mg total) by mouth daily. 06/20/21   Martyn Ehrich, NP  ?fluticasone (FLONASE) 50 MCG/ACT nasal spray Place 2 sprays into both nostrils daily. 06/20/21   Martyn Ehrich, NP  ?mupirocin ointment (BACTROBAN) 2 % Apply to left great toe nailbed once daily for 3 weeks. ?Patient not taking: Reported on 06/17/2021 06/12/20   Marzetta Board, DPM  ? ? ?Physical Exam: ?There were no vitals filed for this visit. ?Physical Exam ?Vitals and nursing note reviewed.  ?Constitutional:   ?   General: He is not in acute distress. ?   Appearance: He is not diaphoretic.  ?HENT:  ?   Head: Atraumatic.  ?Eyes:  ?   Pupils: Pupils are equal, round, and reactive to light.  ?Cardiovascular:  ?  Rate and Rhythm: Normal rate and regular rhythm.  ?   Pulses: Normal pulses.  ?   Heart sounds: No murmur heard. ?Pulmonary:  ?   Effort: Pulmonary effort is normal. No respiratory distress.  ?   Breath sounds: Normal breath sounds. No rales.  ?Abdominal:  ?   General: Abdomen is flat. There is no distension.  ?   Palpations: Abdomen is soft.  ?   Tenderness: There is no abdominal tenderness. There is no rebound.  ?Musculoskeletal:  ?   Right lower leg: No edema.  ?   Left lower leg: No edema.  ?   Comments: Upper extremities are contacted   ?Skin: ?   General: Skin is warm and dry.  ?   Capillary Refill: Capillary refill takes less than 2 seconds.  ?Neurological:  ?   Mental Status: He is alert and oriented to person, place, and time. Mental status is at baseline.  ?Psychiatric:     ?   Mood and Affect: Mood normal.  ?  ?Data Reviewed: ? ? ?  Latest Ref Rng & Units 06/23/2021  ?  4:55 PM 09/22/2020  ?  6:24 PM 09/21/2020  ?  5:48 AM  ?BMP  ?Glucose 70 - 99  mg/dL 124   116   124    ?BUN 6 - 20 mg/dL '16   17   14    '$ ?Creatinine 0.61 - 1.24 mg/dL 0.78   0.84   0.80    ?Sodium 135 - 145 mmol/L 136   142   137    ?Potassium 3.5 - 5.1 mmol/L 3.3   4.7   4.1    ?C

## 2021-06-23 NOTE — Progress Notes (Addendum)
Patient ID: Thomas Ramos, male   DOB: [birth year], 51 y.o.   MRN: [id number] ? ? ? Progress Note ? ? Subjective  ?51 year old white male with cerebral palsy, with quadriplegia, adult onset diabetes mellitus, congestive heart failure, asthma, anxiety, who is admitted today for planned admission in order to undergo bowel prep for colonoscopy, and EGD in setting of chronic esophageal dysphagia from large hiatal hernia and distal esophageal tortuosity. ?Patient has had problems with chronic diarrhea, which may be secondary to medication/metformin, may also have component of overflow as he typically tends more towards chronic constipation. ?Last EGD June 2019/no stricture ?No prior colonoscopy. ? ?Patient has no current GI complaints.  He voices that he is not sure if he is going to be able to take the prep but will do his best ? ? Objective  ? ?Vital signs in last 24 hours: ?Temp:  [97.8 ?F (36.6 ?C)] 97.8 ?F (36.6 ?C) (04/10 1216) ?Pulse Rate:  [98] 98 (04/10 1216) ?Resp:  [16] 16 (04/10 1216) ?BP: (90)/(56) 90/56 (04/10 1216) ?SpO2:  [97 %] 97 % (04/10 1216) ?  ?General:    Chronically ill-appearing white male in NAD, wheelchair-bound ?Heart:  Regular rate and rhythm; no murmurs ?Lungs: Respirations even and unlabored, lungs CTA bilaterally ?Abdomen:  Soft, protuberant nontender and nondistended. Normal bowel sounds. ?Extremities:  Without edema. ?Neurologic:  Alert and oriented, quadriplegic ?Psych:  Cooperative. Normal mood and affect. ? ?Intake/Output from previous day: ?No intake/output data recorded. ?Intake/Output this shift: ?No intake/output data recorded. ? ?Lab Results: ?No results for input(s): WBC, HGB, HCT, PLT in the last 72 hours. ?BMET ?No results for input(s): NA, K, CL, CO2, GLUCOSE, BUN, CREATININE, CALCIUM in the last 72 hours. ?LFT ?No results for input(s): PROT, ALBUMIN, AST, ALT, ALKPHOS, BILITOT, BILIDIR, IBILI in the last 72 hours. ?PT/INR ?No results for input(s): LABPROT, INR in the last 72  hours. ? ?Studies/Results: ?No results found. ? ? ? ? Assessment / Plan:   ? ?#50 51 year old white male with cerebral palsy, quadriplegia being admitted today for planned admission in order to undergo bowel prep and colonoscopy with Dr. Loletha Carrow which is scheduled for tomorrow. ?Patient has had component of chronic diarrhea, etiology not clear-possibly secondary to medication/metformin versus component of overflow versus other. ? ?#2 chronic dysphagia-known large hiatal hernia and distal esophageal tortuosity.  Last EGD 2019 no stricture ?In remote past had undergone Botox at the EG junction due to concerns for achalasia ? ?#2 adult onset diabetes mellitus ?#3 anxiety ?#4.  GERD ?#5.  Hypertension ? ?Plan; clear liquids today, n.p.o. after midnight ?Bowel prep with Nulytely over 4 to 5 hours today, ?Patient is scheduled for EGD and colonoscopy with Dr. Loletha Carrow in a.m. tomorrow. ?Appreciate hospitalist help with admission and managing diabetes and other medical issues. ?Hopefully he will be able to be discharged tomorrow afternoon. ? ? ?Principal Problem: ?  Encounter for screening colonoscopy ? ? ? ? LOS: 1 day  ? ?Amy Esterwood PA-C 06/23/2021, 1:04 PM ? ?GI ATTENDING ? ?History reviewed.  Agree with progress note as outlined above.  Patient with cerebral palsy who has been evaluated as an outpatient for problems with diarrhea and dysphagia.  He is being admitted to assist with bowel preparation for anticipated colonoscopy to evaluate diarrhea and provide colorectal neoplasia screening.  This will be his first colonoscopy.  As well, concurrent upper endoscopy to reassess dysphagia.  The examinations will be performed tomorrow by his primary gastroenterologist, Dr. Loletha Carrow.  We appreciate the hospitalist  assistance with this gentleman. ? ?Docia Chuck. Geri Seminole., M.D. ?[facility] ?Division of Gastroenterology  ?  ?

## 2021-06-23 NOTE — Telephone Encounter (Signed)
Called pt and there was no answer- LMTCB and will mail letter per protocol. ?

## 2021-06-23 NOTE — Anesthesia Preprocedure Evaluation (Addendum)
Anesthesia Evaluation  ?Patient identified by MRN, date of birth, ID band ?Patient awake ? ? ? ?Reviewed: ?Allergy & Precautions, H&P , NPO status , Patient's Chart, lab work & pertinent test results ? ?History of Anesthesia Complications ?Negative for: history of anesthetic complications ? ?Airway ?Mallampati: II ? ?TM Distance: >3 FB ?Neck ROM: Full ? ? ? Dental ? ?(+) Poor Dentition, Dental Advisory Given ?  ?Pulmonary ?sleep apnea and Continuous Positive Airway Pressure Ventilation ,  ?  ?Pulmonary exam normal ? ? ? ? ? ? ? Cardiovascular ?hypertension, Pt. on medications and Pt. on home beta blockers ?Normal cardiovascular exam ? ?Echo 5/22 ??1. Left ventricular ejection fraction, by estimation, is 70 to 75%. The left ventricle has hyperdynamic function. The left ventricle has no regional wall motion abnormalities. There is mild left ventricular hypertrophy. Left ventricular diastolic  ?parameters are consistent with Grade I diastolic dysfunction (impaired relaxation). ??2. Right ventricular systolic function is normal. The right ventricular size is normal. ??3. The mitral valve is normal in structure. No evidence of mitral valve regurgitation. No evidence of mitral stenosis. ??4. The aortic valve is normal in structure. Aortic valve regurgitation is not visualized. No aortic stenosis is present. ??5. The inferior vena cava is normal in size with greater than 50% respiratory variability, suggesting right atrial pressure of 3 mmHg. ?? ?  ?Neuro/Psych ?PSYCHIATRIC DISORDERS Anxiety Depression Quadriplegic spinal paralysis ? ?Cerebral palsy ?negative neurological ROS ?   ? GI/Hepatic ?Neg liver ROS, GERD  Medicated,(+) neg Cirrhosis ?  ?  ? ,   ?Endo/Other  ?negative endocrine ROSdiabetes, Type 2, Oral Hypoglycemic Agents ? Renal/GU ?negative Renal ROS  ?negative genitourinary ?  ?Musculoskeletal ?negative musculoskeletal ROS ?(+)  ? Abdominal ?  ?Peds ?negative pediatric ROS ?(+)   Hematology ?negative hematology ROS ?(+)   ?Anesthesia Other Findings ? ? Reproductive/Obstetrics ?negative OB ROS ? ?  ? ? ? ? ? ? ? ? ? ? ? ? ? ?  ?  ? ? ? ? ? ? ? ?Anesthesia Physical ? ?Anesthesia Plan ? ?ASA: 3 ? ?Anesthesia Plan: MAC  ? ?Post-op Pain Management: Minimal or no pain anticipated  ? ?Induction: Intravenous ? ?PONV Risk Score and Plan: Propofol infusion ? ?Airway Management Planned: Natural Airway and Simple Face Mask ? ?Additional Equipment:  ? ?Intra-op Plan:  ? ?Post-operative Plan:  ? ?Informed Consent: I have reviewed the patients History and Physical, chart, labs and discussed the procedure including the risks, benefits and alternatives for the proposed anesthesia with the patient or authorized representative who has indicated his/her understanding and acceptance.  ? ? ? ?Dental advisory given ? ?Plan Discussed with: Anesthesiologist and CRNA ? ?Anesthesia Plan Comments:   ? ? ? ? ? ?Anesthesia Quick Evaluation ? ?

## 2021-06-23 NOTE — Plan of Care (Signed)

## 2021-06-23 NOTE — Telephone Encounter (Signed)
Called and spoke with Thomas Ramos at [facility] bed placement. Pt has been assigned to [id number]. I called and informed pt. He verbalized understanding. ?

## 2021-06-23 NOTE — H&P (View-Only) (Signed)
Patient ID: Thomas Ramos, male   DOB: 1970-09-06, 51 y.o.   MRN: [id number] ? ? ? Progress Note ? ? Subjective  ?51 year old white male with cerebral palsy, with quadriplegia, adult onset diabetes mellitus, congestive heart failure, asthma, anxiety, who is admitted today for planned admission in order to undergo bowel prep for colonoscopy, and EGD in setting of chronic esophageal dysphagia from large hiatal hernia and distal esophageal tortuosity. ?Patient has had problems with chronic diarrhea, which may be secondary to medication/metformin, may also have component of overflow as he typically tends more towards chronic constipation. ?Last EGD June 2019/no stricture ?No prior colonoscopy. ? ?Patient has no current GI complaints.  He voices that he is not sure if he is going to be able to take the prep but will do his best ? ? Objective  ? ?Vital signs in last 24 hours: ?Temp:  [97.8 ?F (36.6 ?C)] 97.8 ?F (36.6 ?C) (04/10 1216) ?Pulse Rate:  [98] 98 (04/10 1216) ?Resp:  [16] 16 (04/10 1216) ?BP: (90)/(56) 90/56 (04/10 1216) ?SpO2:  [97 %] 97 % (04/10 1216) ?  ?General:    Chronically ill-appearing white male in NAD, wheelchair-bound ?Heart:  Regular rate and rhythm; no murmurs ?Lungs: Respirations even and unlabored, lungs CTA bilaterally ?Abdomen:  Soft, protuberant nontender and nondistended. Normal bowel sounds. ?Extremities:  Without edema. ?Neurologic:  Alert and oriented, quadriplegic ?Psych:  Cooperative. Normal mood and affect. ? ?Intake/Output from previous day: ?No intake/output data recorded. ?Intake/Output this shift: ?No intake/output data recorded. ? ?Lab Results: ?No results for input(s): WBC, HGB, HCT, PLT in the last 72 hours. ?BMET ?No results for input(s): NA, K, CL, CO2, GLUCOSE, BUN, CREATININE, CALCIUM in the last 72 hours. ?LFT ?No results for input(s): PROT, ALBUMIN, AST, ALT, ALKPHOS, BILITOT, BILIDIR, IBILI in the last 72 hours. ?PT/INR ?No results for input(s): LABPROT, INR in the last 72  hours. ? ?Studies/Results: ?No results found. ? ? ? ? Assessment / Plan:   ? ?#67 51 year old white male with cerebral palsy, quadriplegia being admitted today for planned admission in order to undergo bowel prep and colonoscopy with Dr. Loletha Carrow which is scheduled for tomorrow. ?Patient has had component of chronic diarrhea, etiology not clear-possibly secondary to medication/metformin versus component of overflow versus other. ? ?#2 chronic dysphagia-known large hiatal hernia and distal esophageal tortuosity.  Last EGD 2019 no stricture ?In remote past had undergone Botox at the EG junction due to concerns for achalasia ? ?#2 adult onset diabetes mellitus ?#3 anxiety ?#4.  GERD ?#5.  Hypertension ? ?Plan; clear liquids today, n.p.o. after midnight ?Bowel prep with Nulytely over 4 to 5 hours today, ?Patient is scheduled for EGD and colonoscopy with Dr. Loletha Carrow in a.m. tomorrow. ?Appreciate hospitalist help with admission and managing diabetes and other medical issues. ?Hopefully he will be able to be discharged tomorrow afternoon. ? ? ?Principal Problem: ?  Encounter for screening colonoscopy ? ? ? ? LOS: 1 day  ? ?Amy Esterwood PA-C 06/23/2021, 1:04 PM ? ?GI ATTENDING ? ?History reviewed.  Agree with progress note as outlined above.  Patient with cerebral palsy who has been evaluated as an outpatient for problems with diarrhea and dysphagia.  He is being admitted to assist with bowel preparation for anticipated colonoscopy to evaluate diarrhea and provide colorectal neoplasia screening.  This will be his first colonoscopy.  As well, concurrent upper endoscopy to reassess dysphagia.  The examinations will be performed tomorrow by his primary gastroenterologist, Dr. Loletha Carrow.  We appreciate the hospitalist  assistance with this gentleman. ? ?Docia Chuck. Geri Seminole., M.D. ?[place] ?Division of Gastroenterology  ?  ?

## 2021-06-24 ENCOUNTER — Observation Stay (HOSPITAL_BASED_OUTPATIENT_CLINIC_OR_DEPARTMENT_OTHER): Payer: Medicare Other | Admitting: Anesthesiology

## 2021-06-24 ENCOUNTER — Observation Stay (HOSPITAL_COMMUNITY): Payer: Medicare Other | Admitting: Anesthesiology

## 2021-06-24 ENCOUNTER — Other Ambulatory Visit: Payer: Self-pay

## 2021-06-24 ENCOUNTER — Encounter (HOSPITAL_COMMUNITY)
Admission: RE | Disposition: A | Discharge status: Home / Self Care | Payer: Self-pay | Source: Ambulatory Visit | Attending: Internal Medicine

## 2021-06-24 ENCOUNTER — Encounter (HOSPITAL_COMMUNITY): Payer: Self-pay | Admitting: Internal Medicine

## 2021-06-24 DIAGNOSIS — K6389 Other specified diseases of intestine: Secondary | ICD-10-CM

## 2021-06-24 DIAGNOSIS — K529 Noninfective gastroenteritis and colitis, unspecified: Secondary | ICD-10-CM

## 2021-06-24 DIAGNOSIS — K449 Diaphragmatic hernia without obstruction or gangrene: Secondary | ICD-10-CM

## 2021-06-24 DIAGNOSIS — K635 Polyp of colon: Secondary | ICD-10-CM

## 2021-06-24 DIAGNOSIS — Z1211 Encounter for screening for malignant neoplasm of colon: Secondary | ICD-10-CM | POA: Diagnosis not present

## 2021-06-24 DIAGNOSIS — K5939 Other megacolon: Secondary | ICD-10-CM | POA: Diagnosis not present

## 2021-06-24 DIAGNOSIS — D123 Benign neoplasm of transverse colon: Secondary | ICD-10-CM

## 2021-06-24 DIAGNOSIS — R1319 Other dysphagia: Secondary | ICD-10-CM | POA: Diagnosis not present

## 2021-06-24 HISTORY — PX: COLONOSCOPY WITH PROPOFOL: SHX5780

## 2021-06-24 HISTORY — PX: ESOPHAGOGASTRODUODENOSCOPY: SHX5428

## 2021-06-24 HISTORY — PX: POLYPECTOMY: SHX5525

## 2021-06-24 HISTORY — PX: BIOPSY: SHX5522

## 2021-06-24 LAB — GLUCOSE, CAPILLARY: Glucose-Capillary: 153 mg/dL — ABNORMAL HIGH (ref 70–99)

## 2021-06-24 SURGERY — COLONOSCOPY WITH PROPOFOL
Anesthesia: Monitor Anesthesia Care

## 2021-06-24 MED ORDER — LACTATED RINGERS IV SOLN: INTRAVENOUS | Status: DC | PRN

## 2021-06-24 MED ORDER — PROPOFOL 10 MG/ML IV BOLUS
INTRAVENOUS | Status: AC
  Filled 2021-06-24: qty 20

## 2021-06-24 MED ORDER — POTASSIUM CHLORIDE CRYS ER 20 MEQ PO TBCR
40.0000 meq | EXTENDED_RELEASE_TABLET | Freq: Once | ORAL | Status: AC
  Administered 2021-06-24: 40 meq via ORAL
  Filled 2021-06-24: qty 2

## 2021-06-24 MED ORDER — PROPOFOL 500 MG/50ML IV EMUL
INTRAVENOUS | Status: AC
  Filled 2021-06-24: qty 50

## 2021-06-24 MED ORDER — PHENYLEPHRINE 40 MCG/ML (10ML) SYRINGE FOR IV PUSH (FOR BLOOD PRESSURE SUPPORT)
PREFILLED_SYRINGE | INTRAVENOUS | Status: DC | PRN
  Administered 2021-06-24 (×3): 80 ug via INTRAVENOUS

## 2021-06-24 MED ORDER — LIDOCAINE 2% (20 MG/ML) 5 ML SYRINGE
INTRAMUSCULAR | Status: DC | PRN
  Administered 2021-06-24: 100 mg via INTRAVENOUS

## 2021-06-24 MED ORDER — PROPOFOL 10 MG/ML IV BOLUS
INTRAVENOUS | Status: DC | PRN
  Administered 2021-06-24 (×3): 20 mg via INTRAVENOUS

## 2021-06-24 MED ORDER — PROPOFOL 500 MG/50ML IV EMUL
INTRAVENOUS | Status: DC | PRN
  Administered 2021-06-24: 125 ug/kg/min via INTRAVENOUS

## 2021-06-24 MED ORDER — SODIUM CHLORIDE 0.9 % IV SOLN: INTRAVENOUS | Status: DC

## 2021-06-24 SURGICAL SUPPLY — 22 items

## 2021-06-24 NOTE — Interval H&P Note (Signed)
History and Physical Interval Note: ? ?06/24/2021 ?7:38 AM ? ?Thomas Ramos  has presented today for surgery, with the diagnosis of Diarrhea, Dysphagia tt.  The various methods of treatment have been discussed with the patient and family. After consideration of risks, benefits and other options for treatment, the patient has consented to  Procedure(s): ?COLONOSCOPY WITH PROPOFOL (N/A) ?ESOPHAGOGASTRODUODENOSCOPY (EGD) (N/A) as a surgical intervention.  The patient's history has been reviewed, patient examined, no change in status, stable for surgery.  I have reviewed the patient's chart and labs.  Questions were answered to the patient's satisfaction.   ? ?Additional history in my 05/15/21 [facility] office note. ?Admitted for bowel prep and medical management prior to EGD (with possible dilation) and colonoscopy for chronic dysphagia and diarrhea. ? ?Thomas Ramos ? ? ?

## 2021-06-24 NOTE — Op Note (Signed)
[facility] ?Patient Name: Thomas Ramos ?Procedure Date: 06/24/2021 ?MRN: [id number] ?Attending MD: Estill Cotta. Loletha Carrow , MD ?Date of Birth: 1970/04/30 ?CSN: [id number] ?Age: 51 ?Admit Type: Outpatient ?Procedure:                Upper GI endoscopy ?Indications:              Esophageal dysphagia ?Providers:                Estill Cotta. Loletha Carrow, MD, Doristine Johns, RN, Primitivo Gauze  ?                          [place], Technician ?Referring MD:              ?Medicines:                Monitored Anesthesia Care ?Complications:            No immediate complications. ?Estimated Blood Loss:     Estimated blood loss: none. ?Procedure:                Pre-Anesthesia Assessment: ?                          - Prior to the procedure, a History and Physical  ?                          was performed, and patient medications and  ?                          allergies were reviewed. The patient's tolerance of  ?                          previous anesthesia was also reviewed. The risks  ?                          and benefits of the procedure and the sedation  ?                          options and risks were discussed with the patient.  ?                          All questions were answered, and informed consent  ?                          was obtained. Prior Anticoagulants: The patient has  ?                          taken no previous anticoagulant or antiplatelet  ?                          agents. ASA Grade Assessment: III - A patient with  ?                          severe systemic disease. After reviewing the risks  ?  and benefits, the patient was deemed in  ?                          satisfactory condition to undergo the procedure. ?                          After obtaining informed consent, the endoscope was  ?                          passed under direct vision. Throughout the  ?                          procedure, the patient's blood pressure, pulse, and  ?                          oxygen saturations  were monitored continuously. The  ?                          [id number] ([id number] Olympus endoscope was introduced  ?                          through the mouth, and advanced to the second part  ?                          of duodenum. The upper GI endoscopy was  ?                          accomplished without difficulty. The patient  ?                          tolerated the procedure well. ?Scope In: ?Scope Out: ?Findings: ?     The examined esophagus was tortuous. It was also foreshortened due to  ?     the hiatal hernia. No stricture or dilatation seen. ?     A large hiatal hernia was present. (1/3 to 1/2 of the stomach  ?     intrathoracic) ?     The stomach was normal. ?     The cardia and gastric fundus were normal on retroflexion. ?     The examined duodenum was normal. ?Impression:               - Tortuous esophagus. ?                          - Large hiatal hernia. ?                          - Normal stomach. ?                          - Normal examined duodenum. ?                          - No specimens collected. ?                          As before, this patient's dysphagia  is from  ?                          anatomic changes of a large hiatal hernia. ?Moderate Sedation: ?     MAC sedation used ?Recommendation:           - Return patient to hospital ward for possible  ?                          discharge same day. ?                          - See the other procedure note for documentation of  ?                          additional recommendations. ?Procedure Code(s):        --- Professional --- ?                          [phone], Esophagogastroduodenoscopy, flexible,  ?                          transoral; diagnostic, including collection of  ?                          specimen(s) by brushing or washing, when performed  ?                          (separate procedure) ?Diagnosis Code(s):        --- Professional --- ?                          Q39.9, Congenital malformation of esophagus,  ?                           unspecified ?                          K44.9, Diaphragmatic hernia without obstruction or  ?                          gangrene ?                          R13.14, Dysphagia, pharyngoesophageal phase ?CPT copyright 2019 American Medical Association. All rights reserved. ?The codes documented in this report are preliminary and upon coder review may  ?be revised to meet current compliance requirements. ?Subhan Hoopes L. Loletha Carrow, MD ?06/24/2021 8:10:29 AM ?This report has been signed electronically. ?Number of Addenda: 0 ?

## 2021-06-24 NOTE — Op Note (Signed)
[facility] ?Patient Name: Thomas Ramos ?Procedure Date: 06/24/2021 ?MRN: [id number] ?Attending MD: Estill Cotta. Loletha Carrow , MD ?Date of Birth: [birth year] ?CSN: [id number] ?Age: 51 ?Admit Type: Outpatient ?Procedure:                Colonoscopy ?Indications:              Chronic diarrhea ?                          primarily chronic constipation, has developed  ?                          intermittent diarrhea. ?                          Patient withe CP and other medical issues -  ?                          admitted to hospital medical service for medical  ?                          management and bowel preparation ?Providers:                Estill Cotta. Loletha Carrow, MD, Doristine Johns, RN, Primitivo Gauze  ?                          [place], Technician ?Referring MD:             [facility] ?Medicines:                Monitored Anesthesia Care ?Complications:            No immediate complications. ?Estimated Blood Loss:     Estimated blood loss was minimal. ?Procedure:                Pre-Anesthesia Assessment: ?                          - Prior to the procedure, a History and Physical  ?                          was performed, and patient medications and  ?                          allergies were reviewed. The patient's tolerance of  ?                          previous anesthesia was also reviewed. The risks  ?                          and benefits of the procedure and the sedation  ?                          options and risks were discussed with the patient.  ?                          All questions were answered, and informed consent  ?  was obtained. Prior Anticoagulants: The patient has  ?                          taken no previous anticoagulant or antiplatelet  ?                          agents. ASA Grade Assessment: III - A patient with  ?                          severe systemic disease. After reviewing the risks  ?                          and benefits, the patient was deemed in  ?                           satisfactory condition to undergo the procedure. ?                          After obtaining informed consent, the colonoscope  ?                          was passed under direct vision. Throughout the  ?                          procedure, the patient's blood pressure, pulse, and  ?                          oxygen saturations were monitored continuously. The  ?                          [id number] ([id number]) Olympus colonoscope was  ?                          introduced through the anus and advanced to the the  ?                          ileocecal valve. The colonoscopy was extremely  ?                          difficult due to poor bowel prep (in some areas),  ?                          spasm, a redundant colon, significant looping, a  ?                          tortuous colon and the patient's body habitus  ?                          (which made abdominal pressure and airway  ?                          management challenging). Successful completion of  ?  the procedure was aided by changing the patient to  ?                          a supine position, using manual pressure,  ?                          straightening and shortening the scope to obtain  ?                          bowel loop reduction and lavage. The patient  ?                          tolerated the procedure fairly well. The quality of  ?                          the bowel preparation was poor. The ileocecal valve  ?                          and the rectum were photographed. ?Scope In: 8:13:04 AM ?Scope Out: 9:00:39 AM ?Scope Withdrawal Time: 0 hours 17 minutes 38 seconds  ?Total Procedure Duration: 0 hours 47 minutes 35 seconds  ?Findings: ?     The digital rectal exam findings include decreased sphincter tone. ?     The colon (entire examined portion) was significantly redundant. See  ?     above - ICV reached, but cecal base could not be reached and visualized.  ?     Precluded by scope looping. ?     A large  amount of stool was found in the rectum and in the sigmoid  ?     colon, precluding visualization. Some scattered fibrous food debris  ?     elsewhere as well, lavaged for good visualiation in most of the right  ?     colon, fair to poor in left. ?     An area of melanosis was found in the rectum and in the sigmoid colon. ?     The lumen of the rectum was dilated. ?     Normal mucosa was found from descending colon to cecum. Biopsies for  ?     histology were taken with a cold forceps from the right colon and left  ?     colon for evaluation of microscopic colitis. ?     Multiple diverticula were found in the left colon and right colon. ?     Four sessile polyps were found in the transverse colon. The polyps were  ?     diminutive in size. These polyps were removed with a cold snare.  ?     Resection and retrieval were complete. ?     The exam was otherwise without abnormality. ?Impression:               - Preparation of the colon was poor in some areas.  ?                          This patient had a difficult time tolerating bowel  ?                          prep  due to his medical condition and dysphagia. ?                          - Decreased sphincter tone found on digital rectal  ?                          exam. ?                          - Stool in the rectum and in the sigmoid colon. ?                          - Melanosis in the colon. ?                          - Dilated in the rectum. ?                          - Normal mucosa from descending to cecum. Biopsied. ?                          - Diverticulosis in the left colon and in the right  ?                          colon. ?                          - Four diminutive polyps in the transverse colon,  ?                          removed with a cold snare. Resected and retrieved. ?                          - The examination was otherwise normal. ?                          If biopsies rule out microscopic coliits, then this  ?                          patient's  intermittent diarrhea appears to be  ?                          "overflow" fro mchronic constipation and/or  ?                          metformin side effect. ?Moderate Sedation: ?     MAC sedation used ?Recommendation:           - Return patient to hospital ward for possible  ?                          discharge same day. ?                          - Diabetic (ADA) diet. ?                          -  Continue present medications. ?                          - Await pathology results. ?                          - Return to my office at appointment to be  ?                          scheduled. ?Procedure Code(s):        --- Professional --- ?                          [phone], Colonoscopy, flexible; with removal of  ?                          tumor(s), polyp(s), or other lesion(s) by snare  ?                          technique ?                          [id number], 59, Colonoscopy, flexible; with biopsy,  ?                          single or multiple ?Diagnosis Code(s):        --- Professional --- ?                          K63.89, Other specified diseases of intestine ?                          K59.39, Other megacolon ?                          K63.5, Polyp of colon ?                          K52.9, Noninfective gastroenteritis and colitis,  ?                          unspecified ?                          K57.30, Diverticulosis of large intestine without  ?                          perforation or abscess without bleeding ?CPT copyright 2019 American Medical Association. All rights reserved. ?The codes documented in this report are preliminary and upon coder review may  ?be revised to meet current compliance requirements. ?Evia Goldsmith L. Loletha Carrow, MD ?06/24/2021 9:13:40 AM ?This report has been signed electronically. ?Number of Addenda: 0 ?

## 2021-06-24 NOTE — Discharge Summary (Signed)
?Physician Discharge Summary ?  ?Patient: Thomas Ramos MRN: [id number] DOB: 1970-09-24  ?Admit date:     06/23/2021  ?Discharge date: 06/24/21  ?Discharge Physician: Leslee Home  ? ?PCP: Aura Dials, PA-C  ? ?Recommendations at discharge:  ? ?1. Follow up with GI to go over biopsy results ?2.  Can resume diabetic diet ? 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 ?Hospital Course: ?51 year old Caucasian male with a past medical history of OSA, cerebral palsy, quadriplegic spinal paralysis, HFpEF, DM 2, hyperlipidemia.  She was admitted to undergo bowel prep for colonoscopy and EGD in the setting of chronic esophageal dysphagia from large hiatal hernia and distal esophageal tortuosity.  The patient has also had problems with chronic diarrhea.  Today underwent EGD and colonoscopy by GI services.  Please see official report for findings.  4 sessile polyps were noted and removed by cold snare.  Will need to follow-up with GI for biopsy results to rule out microscopic colitis.  Cleared from a GI perspective for discharge.  Can resume previous diabetic diet.  Seen and evaluated this morning after procedures.  Vitals are stable.  Not requiring any oxygen.  Will discharge home. ? ?Admitting diagnosis: ?Encounter for colonoscopy and EGD ?Cerebral palsy ?Quadriplegic spinal paralysis ?DM2 ?OSA ?Asthma ?HFpEF ?Tachycardia unknown etiology ?Hypertension ?Hyperlipidemia ? ? ?Consultants: GI ?Procedures performed: EGD, colonoscopy ?Disposition: Home ?Diet recommendation: Diabetic diet ?Discharge Diet Orders (From admission, onward)  ? ?  Start     Ordered  ? 06/24/21 0000  Diet - low sodium heart healthy       ? 06/24/21 1053  ? ?  ?  ? ?  ? ?Regular  diet ?DISCHARGE MEDICATION: ?Allergies as of 06/24/2021   ? ?   Reactions  ? Penicillins Hives, Nausea And Vomiting, Other (See Comments)  ? Patient tolerated cefazolin in 2017 ?Has patient had a PCN reaction causing immediate rash, facial/tongue/throat swelling, SOB or lightheadedness with hypotension: Yes ?Has patient had a PCN reaction causing severe rash involving mucus membranes or skin necrosis: No ?Has patient had a PCN reaction that required hospitalization No ?Has patient had a PCN reaction occurring within the last 10 years: Yes ?If all of the above answers are "NO", then may proceed with Cephalosporin use.  ? Sulfamethoxazole-trimethoprim Nausea And Vomiting  ? Metronidazole Nausea And Vomiting  ? ?  ? ?  ?Medication List  ?  ? ?TAKE these medications   ? ?amLODipine 10 MG tablet ?Commonly known as: NORVASC ?Take 10 mg by mouth daily. ?  ?aspirin 81 MG chewable tablet ?Chew 81 mg by mouth daily. ?  ?baclofen 10 MG tablet ?Commonly known as: LIORESAL ?Take 2.5 mg by mouth at bedtime. ?  ?[employer] Clotrimazole 1 % cream ?Generic drug: clotrimazole ?Apply 1 application topically 2 (two) times daily as needed (rash). BETWEEN TOES ?  ?dextromethorphan 15 MG/5ML syrup ?Take 10 mLs (30 mg total) by mouth 4 (four) times daily as needed for cough. ?  ?diclofenac Sodium 1 % Gel ?Commonly known as: VOLTAREN ?Apply 1 application. topically daily as needed for pain. ?  ?Dulaglutide 0.75 MG/0.5ML Sopn ?Inject 0.75 mg into the skin once a week. Tuesdays ?  ?empagliflozin 25 MG Tabs tablet ?Commonly known as: JARDIANCE ?25 mg in the morning. ?  ?famotidine 20 MG tablet ?  Commonly known as: PEPCID ?Take 1 tablet (20 mg total) by mouth daily. ?  ?fluticasone 50 MCG/ACT nasal spray ?Commonly known as: FLONASE ?Place 2 sprays into both nostrils daily. ?  ?furosemide 20 MG tablet ?Commonly known as: LASIX ?Take 20 mg by mouth daily. ?  ?latanoprost 0.005 % ophthalmic solution ?Commonly known as: XALATAN ?Place 1 drop into both eyes  daily as needed (eye pressure). ?  ?metoprolol succinate 25 MG 24 hr tablet ?Commonly known as: TOPROL-XL ?Take 25 mg by mouth daily. ?  ?montelukast 10 MG tablet ?Commonly known as: SINGULAIR ?Take 10 mg by mouth at bedtime. ?  ?naproxen sodium 220 MG tablet ?Commonly known as: ALEVE ?Take 220-440 mg by mouth 2 (two) times daily as needed (for pain). ?  ?nitroGLYCERIN 0.4 MG SL tablet ?Commonly known as: NITROSTAT ?Place 0.4 mg under the tongue every 5 (five) minutes as needed for chest pain. ?  ?omeprazole 20 MG capsule ?Commonly known as: PRILOSEC ?Take 1 capsule by mouth every morning. ?  ?ProAir RespiClick 782 (90 Base) MCG/ACT Aepb ?Generic drug: Albuterol Sulfate ?Inhale 2 puffs into the lungs every 6 (six) hours as needed (Shortness of breath). ?  ?rosuvastatin 5 MG tablet ?Commonly known as: CRESTOR ?Take 5 mg by mouth at bedtime. ?  ?timolol 0.5 % ophthalmic solution ?Commonly known as: TIMOPTIC ?Place 1 drop into both eyes 2 (two) times daily. ?  ?valsartan 80 MG tablet ?Commonly known as: DIOVAN ?Take 80 mg by mouth daily. ?  ? ?  ? ? Follow-up Information   ? ? Doran Stabler, MD. Schedule an appointment as soon as possible for a visit.   ?Specialty: Gastroenterology ?Contact information: ?[address] ?[address] ?[place] [address] ?[phone] ? ? ?  ?  ? ?  ?  ? ?  ? ?Discharge Exam: ?Filed Weights  ? 06/23/21 1216  ?Weight: 67.9 kg  ? ?Constitutional:   ?   General: He is not in acute distress. ?   Appearance: He is not diaphoretic.  ?HENT:  ?   Head: Atraumatic.  ?Eyes:  ?   Pupils: Pupils are equal, round, and reactive to light.  ?Cardiovascular:  ?   Rate and Rhythm: Normal rate and regular rhythm.  ?   Pulses: Normal pulses.  ?   Heart sounds: No murmur heard. ?Pulmonary:  ?   Effort: Pulmonary effort is normal. No respiratory distress.  ?   Breath sounds: Normal breath sounds. No rales.  ?Abdominal:  ?   General: Abdomen is flat. There is no distension.  ?   Palpations: Abdomen is soft.   ?   Tenderness: There is no abdominal tenderness. There is no rebound.  ?Musculoskeletal:  ?   Right lower leg: No edema.  ?   Left lower leg: No edema.  ?   Comments: Upper extremities are contacted   ?Skin: ?   General: Skin is warm and dry.  ?   Capillary Refill: Capillary refill takes less than 2 seconds.  ?Neurological:  ?   Mental Status: He is alert and oriented to person, place, and time. Mental status is at baseline.  ?Psychiatric:     ?   Mood and Affect: Mood normal.  ? ?Condition at discharge: good ? ?The results of significant diagnostics from this hospitalization (including imaging, microbiology, ancillary and laboratory) are listed below for reference.  ? ?Imaging Studies: ?No results found. ? ?Microbiology: ?Results for orders placed or performed during the hospital encounter of  09/20/20  ?Resp Panel by RT-PCR (Flu A&B, Covid) Nasopharyngeal Swab     Status: Abnormal  ? Collection Time: 09/20/20  6:02 PM  ? Specimen: Nasopharyngeal Swab; Nasopharyngeal(NP) swabs in vial transport medium  ?Result Value Ref Range Status  ? SARS Coronavirus 2 by RT PCR POSITIVE (A) NEGATIVE Final  ?  Comment: RESULT CALLED TO, READ BACK BY AND VERIFIED WITH: ?BANNO,A RN '@2138'$  ON 09/20/20 JACKSON,K ?(NOTE) ?SARS-CoV-2 target nucleic acids are DETECTED. ? ?The SARS-CoV-2 RNA is generally detectable in upper respiratory ?specimens during the acute phase of infection. Positive results are ?indicative of the presence of the identified virus, but do not rule ?out bacterial infection or co-infection with other pathogens not ?detected by the test. Clinical correlation with patient history and ?other diagnostic information is necessary to determine patient ?infection status. The expected result is Negative. ? ?Fact Sheet for Patients: ?EntrepreneurPulse.com.au ? ?Fact Sheet for Healthcare Providers: ?IncredibleEmployment.be ? ?This test is not yet approved or cleared by the [place] FDA and   ?has been authorized for detection and/or diagnosis of SARS-CoV-2 by ?FDA under an Emergency Use Authorization (EUA).  This EUA will ?remain in effect (meaning this test can  be used) for the duration of  ?t

## 2021-06-24 NOTE — Care Management CC44 (Signed)
Condition Code 44 Documentation Completed ? ?Patient Details  ?Name: Thomas Ramos ?MRN: [id number] ?Date of Birth: 1970-07-09 ? ? ?Condition Code 44 given:  Yes ?Patient signature on Condition Code 44 notice:  Yes ?Documentation of 2 MD's agreement:  Yes ?Code 44 added to claim:  Yes ? ? ? ?Janah Mcculloh, LCSW ?06/24/2021, 11:25 AM ? ?

## 2021-06-24 NOTE — Transfer of Care (Signed)
Immediate Anesthesia Transfer of Care Note ? ?Patient: Thomas Ramos ? ?Procedure(s) Performed: COLONOSCOPY WITH PROPOFOL ?ESOPHAGOGASTRODUODENOSCOPY (EGD) ?BIOPSY ?POLYPECTOMY ? ?Patient Location: Endoscopy Unit ? ?Anesthesia Type:MAC ? ?Level of Consciousness: awake ? ?Airway & Oxygen Therapy: Patient Spontanous Breathing and Patient connected to face mask oxygen ? ?Post-op Assessment: Report given to RN and Post -op Vital signs reviewed and stable ? ?Post vital signs: Reviewed and stable ? ?Last Vitals:  ?Vitals Value Taken Time  ?BP    ?Temp    ?Pulse    ?Resp    ?SpO2    ? ? ?Last Pain:  ?Vitals:  ? 06/24/21 0730  ?TempSrc: Temporal  ?PainSc: 0-No pain  ?   ? ?  ? ?Complications: No notable events documented. ?

## 2021-06-24 NOTE — Discharge Instructions (Signed)
Follow up with GI. They will call you to set up an appointment. ?

## 2021-06-24 NOTE — Care Management Obs Status (Signed)
MEDICARE OBSERVATION STATUS NOTIFICATION ? ? ?Patient Details  ?Name: Thomas Ramos ?MRN: [id number] ?Date of Birth: 1970/12/05 ? ? ?Medicare Observation Status Notification Given:  Yes ? ? ? ?Jodene Polyak, LCSW ?06/24/2021, 11:25 AM ?

## 2021-06-24 NOTE — Anesthesia Postprocedure Evaluation (Signed)
Anesthesia Post Note ? ?Patient: Thomas Ramos ? ?Procedure(s) Performed: COLONOSCOPY WITH PROPOFOL ?ESOPHAGOGASTRODUODENOSCOPY (EGD) ?BIOPSY ?POLYPECTOMY ? ?  ? ?Patient location during evaluation: Endoscopy ?Anesthesia Type: MAC ?Level of consciousness: awake and alert ?Pain management: pain level controlled ?Vital Signs Assessment: post-procedure vital signs reviewed and stable ?Respiratory status: spontaneous breathing and respiratory function stable ?Cardiovascular status: stable ?Postop Assessment: no apparent nausea or vomiting ?Anesthetic complications: no ? ? ?No notable events documented. ? ?Last Vitals:  ?Vitals:  ? 06/24/21 0920 06/24/21 0930  ?BP: (!) 108/55 (!) 107/53  ?Pulse: 73 76  ?Resp: 13 (!) 25  ?Temp:    ?SpO2: 99% 99%  ?  ?Last Pain:  ?Vitals:  ? 06/24/21 0930  ?TempSrc:   ?PainSc: 0-No pain  ? ? ?  ?  ?  ?  ?  ?  ? ?Areanna Gengler DANIEL ? ? ? ? ?

## 2021-06-25 ENCOUNTER — Encounter (HOSPITAL_COMMUNITY): Payer: Self-pay | Admitting: Gastroenterology

## 2021-06-25 LAB — SURGICAL PATHOLOGY

## 2021-06-26 ENCOUNTER — Encounter: Payer: Self-pay | Admitting: Gastroenterology

## 2021-06-27 ENCOUNTER — Telehealth: Payer: Self-pay | Admitting: Primary Care

## 2021-06-27 NOTE — Telephone Encounter (Signed)
Called and spoke with patient. He stated that he received a letter from our office stating that we had been trying to reach him. I advised him that it looks like he were calling him in regards to his cough. He is aware of Beth's recommendations of Flonase and Pepcid at night.  ? ?He is aware to call us if anything changes.  ? ?Nothing further needed at time of call.  ?

## 2021-06-30 ENCOUNTER — Telehealth: Payer: Self-pay | Admitting: Gastroenterology

## 2021-06-30 NOTE — Telephone Encounter (Signed)
Lm on vm for patient to return call 

## 2021-06-30 NOTE — Telephone Encounter (Signed)
Inbound call from patient requesting results from procedure done on 4/11. Please advise.  ?

## 2021-07-01 ENCOUNTER — Telehealth: Payer: Self-pay | Admitting: Primary Care

## 2021-07-01 NOTE — Telephone Encounter (Signed)
Pt returned call. We have reviewed his pathology result letter and recommendations. Pt has been scheduled for a follow up with Dr. Loletha Carrow on Wednesday, 08/13/21 at 1:20 pm. Pt is aware that he will receive his results in the mail. Pt verbalized understanding and had no concerns at the end of the call. ? ?Letter mailed to patient with appt information. ?

## 2021-07-02 NOTE — Telephone Encounter (Signed)
Called and spoke with pt letting him know the info per VS and he verbalized understanding. Nothing further needed. ?

## 2021-07-02 NOTE — Telephone Encounter (Signed)
ATC patient, LMTCB 

## 2021-07-02 NOTE — Telephone Encounter (Signed)
Spoke with patient who is asking for [place] to fax him a letter stating that he needs to only wear his cpap 4 hours a night and needs to be at an overall percentage of 70% or whatever the percentage is that is required for insurance purposes for his care giver. Fax number he provided is [phone]. ? ?Dr. Halford Chessman please advise ?

## 2021-07-02 NOTE — Telephone Encounter (Signed)
Why would he need a letter like this?  My recommendation is that he use CPAP therapy whenever he is asleep to get maximum benefit from therapy.  The 4 hours per night and 70% of nights are only insurance company guidelines they use to document minimum level of compliance and should not be used as a gauge to what is needed for optimal therapy. ?

## 2021-07-02 NOTE — Telephone Encounter (Signed)
Attempted to call pt but unable to reach. Left message for him to return call. °

## 2021-08-13 ENCOUNTER — Encounter: Payer: Self-pay | Admitting: Gastroenterology

## 2021-08-13 ENCOUNTER — Ambulatory Visit (INDEPENDENT_AMBULATORY_CARE_PROVIDER_SITE_OTHER): Payer: Medicare Other | Admitting: Gastroenterology

## 2021-08-13 VITALS — BP 130/70 | HR 86

## 2021-08-13 DIAGNOSIS — K449 Diaphragmatic hernia without obstruction or gangrene: Secondary | ICD-10-CM

## 2021-08-13 DIAGNOSIS — D126 Benign neoplasm of colon, unspecified: Secondary | ICD-10-CM

## 2021-08-13 DIAGNOSIS — K5909 Other constipation: Secondary | ICD-10-CM

## 2021-08-13 DIAGNOSIS — K529 Noninfective gastroenteritis and colitis, unspecified: Secondary | ICD-10-CM

## 2021-08-13 DIAGNOSIS — R1319 Other dysphagia: Secondary | ICD-10-CM | POA: Diagnosis not present

## 2021-08-13 NOTE — Progress Notes (Signed)
[facility] Progress Note  Chief Complaint: Chronic diarrhea  Subjective  History: Thomas Ramos was seen in follow-up after recent inpatient procedures.  He has chronic dysphagia from esophageal tortuosity and hiatal hernia as well as diarrhea that seem most likely due to metformin or perhaps constipation and overflow.  Due to his CP and limited mobility he was admitted to the hospital for endoscopic testing on 06/24/2021. EGD revealed a large hiatal hernia (1/3-1/2 of the stomach intrathoracic) with distal esophageal tortuosity.  No fixed stricture or other mechanical lesion amenable to endoscopic dilation. Colonoscopy was a challenging procedure due to his body habitus and a redundant colon causing significant scope looping.  After 30 minutes of effort, the ileocecal valve was reached and the cecal base could not be seen due to scope looping and poor bowel prep.  Random colon biopsies negative for microscopic colitis, 4 subcentimeter tubular adenomas removed.  Left and right-sided diverticulosis found. My impression was that his diarrhea appeared to be most likely overflow from chronic constipation (CP, contracted body habitus, limited mobility, GLP-1 agonist), since he told me he had stopped taking metformin.  Thomas Ramos wanted to discuss his bowel habits and findings in the time of colonoscopy and endoscopy.  All these results were reviewed in detail and questions answered.  He agrees that he tends toward constipation but about every 10 to 14 days wants to take 1 or 2 Imodium tablets if he needs to travel to [place] or go to work without having worries about an episode of diarrhea and incontinence.  ROS: Cardiovascular:  no chest pain Respiratory: no dyspnea Episodic cough The patient's Past Medical, Family and Social History were reviewed and are on file in the EMR.  Objective:  Med list reviewed  Current Outpatient Medications:    Albuterol Sulfate (PROAIR RESPICLICK) 462 (90 Base)  MCG/ACT AEPB, Inhale 2 puffs into the lungs every 6 (six) hours as needed (Shortness of breath)., Disp: , Rfl:    amLODipine (NORVASC) 10 MG tablet, Take 10 mg by mouth daily., Disp: , Rfl:    aspirin 81 MG chewable tablet, Chew 81 mg by mouth daily., Disp: , Rfl:    baclofen (LIORESAL) 10 MG tablet, Take 2.5 mg by mouth at bedtime., Disp: , Rfl:    [employer] CLOTRIMAZOLE 1 % cream, Apply 1 application topically 2 (two) times daily as needed (rash). BETWEEN TOES, Disp: , Rfl: 1   dextromethorphan 15 MG/5ML syrup, Take 10 mLs (30 mg total) by mouth 4 (four) times daily as needed for cough., Disp: 120 mL, Rfl: 0   diclofenac Sodium (VOLTAREN) 1 % GEL, Apply 1 application. topically daily as needed for pain., Disp: , Rfl:    Dulaglutide 0.75 MG/0.5ML SOPN, Inject 0.75 mg into the skin once a week. Tuesdays, Disp: , Rfl:    empagliflozin (JARDIANCE) 25 MG TABS tablet, 25 mg in the morning., Disp: , Rfl:    famotidine (PEPCID) 20 MG tablet, Take 1 tablet (20 mg total) by mouth daily., Disp: 30 tablet, Rfl: 0   fluticasone (FLONASE) 50 MCG/ACT nasal spray, Place 2 sprays into both nostrils daily., Disp: 16 g, Rfl: 2   furosemide (LASIX) 20 MG tablet, Take 20 mg by mouth daily., Disp: , Rfl:    latanoprost (XALATAN) 0.005 % ophthalmic solution, Place 1 drop into both eyes daily as needed (eye pressure)., Disp: , Rfl:    metoprolol succinate (TOPROL-XL) 25 MG 24 hr tablet, Take 25 mg by mouth daily., Disp: , Rfl:  montelukast (SINGULAIR) 10 MG tablet, Take 10 mg by mouth at bedtime., Disp: , Rfl:    naproxen sodium (ALEVE) 220 MG tablet, Take 220-440 mg by mouth 2 (two) times daily as needed (for pain)., Disp: , Rfl:    nitroGLYCERIN (NITROSTAT) 0.4 MG SL tablet, Place 0.4 mg under the tongue every 5 (five) minutes as needed for chest pain. , Disp: , Rfl:    omeprazole (PRILOSEC) 20 MG capsule, Take 1 capsule by mouth every morning., Disp: , Rfl:    rosuvastatin (CRESTOR) 5 MG tablet, Take 5 mg by mouth at  bedtime., Disp: , Rfl:    timolol (TIMOPTIC) 0.5 % ophthalmic solution, Place 1 drop into both eyes 2 (two) times daily., Disp: , Rfl:    valsartan (DIOVAN) 80 MG tablet, Take 80 mg by mouth daily., Disp: , Rfl:    Vital signs in last 24 hrs: Vitals:   08/13/21 1325  BP: 130/70  Pulse: 86   Wt Readings from Last 3 Encounters:  06/23/21 149 lb 11.1 oz (67.9 kg)  09/20/20 154 lb 5.2 oz (70 kg)  04/25/20 145 lb (65.8 kg)    Physical Exam  Wheelchair-bound, contractures, alert oriented and conversational.  He looks per usual for him.  His wheelchair is still filthy Cardiac: Regular without murmur,  no peripheral edema Pulm: clear to auscultation bilaterally, limited inspiratory effort Abdomen: soft, no tenderness, with active bowel sounds.  Labs:   ___________________________________________ Radiologic studies:   ____________________________________________ Other:   _____________________________________________ Assessment & Plan  Assessment: Encounter Diagnoses  Name Primary?   Chronic constipation Yes   Chronic diarrhea    Adenomatous polyp of colon, unspecified part of colon    Esophageal dysphagia    Hiatal hernia     Thomas Ramos has chronic constipation with the above noted factors, and I believe he has intermittent overflow diarrhea.  He feels strongly that he would like to take an occasional dose of Imodium, knows he should do so sparingly, and would like my approval to do so so he does not get a hassle from his caregivers. I have no objection to Thomas Ramos doing that since he feels strongly that it helps him in certain circumstances and his treatment options are limited.  He does not have the caregiving and support necessary to take a periodic bowel purge for relief of constipation.  Regarding his adenomatous polyps, my current plan is for a recall colonoscopy in 3 years given the poor prep on recent exam.  When that time comes, we will reevaluate his health and willingness to  have another hospitalization due to the procedure, since he would need to undergo 2 full days of bowel preparation for sufficient visualization to make it worth the risks and resource expenditure of the procedure.  Thomas Ramos has come to live with his dysphagia from the esophageal anatomical abnormalities and has stated multiple times he has no intention of ever getting a feeding tube even if he has recurrent aspiration.  Thomas Ramos will come to see me as needed.  31 minutes were spent on this encounter (including chart review, history/exam, counseling/coordination of care, and documentation) > 50% of that time was spent on counseling and coordination of care.   [name]

## 2021-08-13 NOTE — Patient Instructions (Signed)
If you are age 49 or older, your body mass index should be between 23-30. Your There is no height or weight on file to calculate BMI. If this is out of the aforementioned range listed, please consider follow up with your Primary Care Provider.  If you are age 16 or younger, your body mass index should be between 19-25. Your There is no height or weight on file to calculate BMI. If this is out of the aformentioned range listed, please consider follow up with your Primary Care Provider.   ________________________________________________________  The [facility] providers would like to encourage you to use [facility] to communicate with providers for non-urgent requests or questions.  Due to long hold times on the telephone, sending your provider a message by [facility] may be a faster and more efficient way to get a response.  Please allow 48 business hours for a response.  Please remember that this is for non-urgent requests.  _______________________________________________________  Follow up as needed.  It was a pleasure to see you today!  Thank you for trusting me with your gastrointestinal care!

## 2021-08-14 NOTE — Telephone Encounter (Signed)
Called Mrs. Reaves and informed her that ThomasRamos hygiene was not improved since his last visit in March. I also made her aware that his wheel chair was extremely filthy. She states she will look in to our concerns.

## 2021-08-20 ENCOUNTER — Telehealth: Payer: Self-pay | Admitting: Primary Care

## 2021-08-26 ENCOUNTER — Encounter: Payer: Self-pay | Admitting: Podiatry

## 2021-08-26 ENCOUNTER — Ambulatory Visit (INDEPENDENT_AMBULATORY_CARE_PROVIDER_SITE_OTHER): Payer: Medicare Other | Admitting: Podiatry

## 2021-08-26 DIAGNOSIS — M79674 Pain in right toe(s): Secondary | ICD-10-CM

## 2021-08-26 DIAGNOSIS — B351 Tinea unguium: Secondary | ICD-10-CM | POA: Diagnosis not present

## 2021-08-26 DIAGNOSIS — M79675 Pain in left toe(s): Secondary | ICD-10-CM

## 2021-08-26 DIAGNOSIS — E1142 Type 2 diabetes mellitus with diabetic polyneuropathy: Secondary | ICD-10-CM | POA: Diagnosis not present

## 2021-08-26 NOTE — Patient Instructions (Addendum)
Patient must have caregiver accompany him on to office visits.  Diabetic Neuropathy Diabetic neuropathy refers to nerve damage that is caused by diabetes. Over time, people with diabetes can develop nerve damage throughout the body. There are several types of diabetic neuropathy: Peripheral neuropathy. This is the most common type of diabetic neuropathy. It damages the nerves that carry signals between the spinal cord and other parts of the body (peripheral nerves). This usually affects nerves in the feet, legs, hands, and arms. Autonomic neuropathy. This type causes damage to nerves that control involuntary functions (autonomic nerves). Involuntary functions are functions of the body that you do not control. They include heartbeat, body temperature, blood pressure, urination, digestion, sweating, sexual function, or response to changes in blood glucose. Focal neuropathy. This type of nerve damage affects one area of the body, such as an arm, a leg, or the face. The injury may involve one nerve or a small group of nerves. Focal neuropathy can be painful and unpredictable. It occurs most often in older adults with diabetes. This often develops suddenly, but usually improves over time and does not cause long-term problems. Proximal neuropathy. This type of nerve damage affects the nerves of the thighs, hips, buttocks, or legs. It causes severe pain, weakness, and muscle death (atrophy), usually in the thigh muscles. It is more common among older men and people who have type 2 diabetes. The length of recovery time may vary. What are the causes? Peripheral, autonomic, and focal neuropathies are caused by diabetes that is not well controlled with treatment. The cause of proximal neuropathy is not known, but it may be caused by inflammation related to uncontrolled blood glucose levels. What are the signs or symptoms? Peripheral neuropathy Peripheral neuropathy develops slowly over time. When the nerves of the  feet and legs no longer work, you may experience: Burning, stabbing, or aching pain in the legs or feet. Pain or cramping in the legs or feet. Loss of feeling (numbness) and inability to feel pressure or pain in the feet. This can lead to: Thick calluses or sores on areas of constant pressure. Ulcers. Reduced ability to feel temperature changes. Foot deformities. Muscle weakness. Loss of balance or coordination. Autonomic neuropathy The symptoms of autonomic neuropathy vary depending on which nerves are affected. Symptoms may include: Problems with digestion, such as: Nausea or vomiting. Poor appetite. Bloating. Diarrhea or constipation. Trouble swallowing. Losing weight without trying to. Problems with the heart, blood, and lungs, such as: Dizziness, especially when standing up. Fainting. Shortness of breath. Irregular heartbeat. Bladder problems, such as: Trouble starting or stopping urination. Leaking urine. Trouble emptying the bladder. Urinary tract infections (UTIs). Problems with other body functions, such as: Sweat. You may sweat too much or too little. Temperature. You might get hot easily. Or, you might feel cold more than usual. Sexual function. Men may not be able to get or maintain an erection. Women may have vaginal dryness and difficulty with arousal. Focal neuropathy Symptoms affect only one area of the body. Common symptoms include: Numbness. Tingling. Burning pain. Prickling feeling. Very sensitive skin. Weakness. Inability to move (paralysis). Muscle twitching. Muscles getting smaller (wasting). Poor coordination. Double or blurred vision. Proximal neuropathy Sudden, severe pain in the hip, thigh, or buttocks. Pain may spread from the back into the legs (sciatica). Pain and numbness in the arms and legs. Tingling. Loss of bladder control or bowel control. Weakness and wasting of thigh muscles. Difficulty getting up from a seated  position. Abdominal swelling. Unexplained weight  loss. How is this diagnosed? Diagnosis varies depending on the type of neuropathy your health care provider suspects. Peripheral neuropathy Your health care provider will do a neurologic exam. This exam checks your reflexes, how you move, and what you can feel. You may have other tests, such as: Blood tests. Tests of the fluid that surrounds the spinal cord (lumbar puncture). CT scan. MRI. Checking the nerves that control muscles (electromyogram, or EMG). Checking how quickly signals pass through your nerves (nerve conduction study). Checking a small piece of a nerve using a microscope (biopsy). Autonomic neuropathy You may have tests, such as: Tests to measure your blood pressure and heart rate. You may be secured to an exam table that moves you from a lying position to an upright position (table tilt test). Breathing tests to check your lungs. Tests to check how food moves through the digestive system (gastric emptying tests). Blood, sweat, or urine tests. Ultrasound of your bladder. Spinal fluid tests. Focal neuropathy This condition may be diagnosed with: A neurologic exam. CT scan. MRI. EMG. Nerve conduction study. Proximal neuropathy There is no test to diagnose this type of neuropathy. You may have tests to rule out other possible causes of this type of neuropathy. Tests may include: X-rays of your spine and lumbar region. Lumbar puncture. MRI. How is this treated? The goal of treatment is to keep nerve damage from getting worse. Treatment may include: Following your diabetes management plan. This will help keep your blood glucose level and your A1C level within your target range. This is the most important treatment. Using prescription pain medicine. Follow these instructions at home: Diabetes management Follow your diabetes management plan as told by your health care provider. Check your blood glucose levels. Keep  your blood glucose in your target range. Have your A1C level checked at least two times a year, or as often as told. Take over the counter and prescription medicines only as told by your health care provider. This includes insulin and diabetes medicine.  Lifestyle  Do not use any products that contain nicotine or tobacco, such as cigarettes, e-cigarettes, and chewing tobacco. If you need help quitting, ask your health care provider. Be physically active every day. Include strength training and balance exercises. Follow a healthy meal plan. Work with your health care provider to manage your blood pressure. General instructions Ask your health care provider if the medicine prescribed to you requires you to avoid driving or using machinery. Check your skin and feet every day for cuts, bruises, redness, blisters, or sores. Keep all follow-up visits. This is important. Contact a health care provider if: You have burning, stabbing, or aching pain in your legs or feet. You are unable to feel pressure or pain in your feet. You develop problems with digestion, such as: Nausea. Vomiting. Bloating. Constipation. Diarrhea. Abdominal pain. You have difficulty with urination, such as: Inability to control when you urinate (incontinence). Inability to completely empty the bladder (retention). You feel as if your heart is racing (palpitations). You feel dizzy, weak, or faint when you stand up. Get help right away if: You cannot urinate. You have sudden weakness or loss of coordination. You have trouble speaking. You have pain or pressure in your chest. You have an irregular heartbeat. You have sudden inability to move a part of your body. These symptoms may represent a serious problem that is an emergency. Do not wait to see if the symptoms will go away. Get medical help right away. Call your local  emergency services (911 in the [place] Do not drive yourself to the hospital. Summary Diabetic  neuropathy is nerve damage that is caused by diabetes. It can cause numbness and pain in the arms, legs, digestive tract, heart, and other body systems. This condition is treated by keeping your blood glucose level and your A1C level within your target range. This can help prevent neuropathy from getting worse. Check your skin and feet every day for cuts, bruises, redness, blisters, or sores. Do not use any products that contain nicotine or tobacco, such as cigarettes, e-cigarettes, and chewing tobacco. This information is not intended to replace advice given to you by your health care provider. Make sure you discuss any questions you have with your health care provider. Document Revised: 07/13/2019 Document Reviewed: 07/13/2019 Elsevier Patient Education  Thomas Ramos.

## 2021-08-27 NOTE — Telephone Encounter (Signed)
Called patient but he did not answer. Left message for him to call back.  

## 2021-09-01 NOTE — Progress Notes (Signed)
  Subjective:  Patient ID: Thomas Ramos, male    DOB: April 26, 1970,  MRN: [id number]  Thomas Ramos presents to clinic today for at risk foot care. Patient has history of CP, quadriplegia and diabetes and painful thick toenails that are difficult to trim. Pain interferes with ambulation. Aggravating factors include wearing enclosed shoe gear. Pain is relieved with periodic professional debridement.  Patient  resides in a group home. He is without his caretaker on today's visit.  Last known HgA1c was unknown. Patient did not check blood glucose today.  New problem(s): None.   PCP is [name] , and last visit was June 02, 2021.  Allergies  Allergen Reactions   Penicillins Hives, Nausea And Vomiting and Other (See Comments)    Patient tolerated cefazolin in 2017 Has patient had a PCN reaction causing immediate rash, facial/tongue/throat swelling, SOB or lightheadedness with hypotension: Yes Has patient had a PCN reaction causing severe rash involving mucus membranes or skin necrosis: No Has patient had a PCN reaction that required hospitalization No Has patient had a PCN reaction occurring within the last 10 years: Yes If all of the above answers are "NO", then may proceed with Cephalosporin use.   Sulfamethoxazole-Trimethoprim Nausea And Vomiting   Metronidazole Nausea And Vomiting    Review of Systems: Negative except as noted in the HPI.  Objective: No changes noted in today's physical examination. There were no vitals filed for this visit.  Thomas Ramos is a pleasant 51 y.o. male in NAD. AAO X 3.  Vascular Examination: CFT <3 seconds b/l LE. Palpable pedal pulses b/l LE. Pedal hair sparse. No pain with calf compression b/l. Lower extremity skin temperature gradient within normal limits. Dependent edema noted b/l LE. No cyanosis or clubbing noted b/l LE.  Dermatological Examination: No open wounds b/l LE. No interdigital macerations noted b/l LE. Toenails 1-5 b/l  elongated, discolored, dystrophic, thickened, crumbly with subungual debris and tenderness to dorsal palpation. No hyperkeratotic nor porokeratotic lesions present on today's visit. Scarring from previous blisters anterior ankles. Completely healed.  Musculoskeletal Examination: Flaccid lower extremity noted b/l lower extremities. Overlapping great toe left foot. Pes planus deformity noted bilateral LE. Utilizes motorized chair for mobility assistance.  Neurological:  Pt has subjective symptoms of neuropathy.  Protective sensation intact 5/5 intact bilaterally with 10g monofilament b/l.     Latest Ref Rng & Units 09/20/2020   10:50 PM  Hemoglobin A1C  Hemoglobin-A1c 4.8 - 5.6 % 6.6    Assessment/Plan: 1. Pain due to onychomycosis of toenails of both feet   2. Diabetic peripheral neuropathy associated with type 2 diabetes mellitus ([place])     -Patient was evaluated and treated. All patient's and/or POA's questions/concerns answered on today's visit. -We discussed neuropathy symptoms and pedal edema. Recommended diabetic socks for less constriction of legs. He related understanding. Informed patient he should have caretaker accompany him on all visits. -Toenails 1-5 b/l were debrided in length and girth with sterile nail nippers and dremel without iatrogenic bleeding.  -Patient/POA to call should there be question/concern in the interim.   Return in about 3 months (around 11/26/2021).  Marzetta Board, DPM

## 2021-09-03 ENCOUNTER — Ambulatory Visit: Payer: Medicare Other | Admitting: Pulmonary Disease

## 2021-09-10 ENCOUNTER — Encounter: Payer: Self-pay | Admitting: *Deleted

## 2021-09-10 NOTE — Telephone Encounter (Signed)
Called and spoke with pt. Pt is wanting to know if it is okay for him to take the flonase nasal spray as needed. Pt is requesting to have a letter faxed to [employer] at fax number [phone].  Beth, please advise on this for pt.

## 2021-09-10 NOTE — Telephone Encounter (Signed)
Letter stating ok to use flonase prn printed and faxed to the number provided. Received fax confirmation. Tried calling the pt to let him know this was done, no answer and no option to leave msg, WCB.

## 2021-09-10 NOTE — Telephone Encounter (Signed)
It is fine to take flonase as needed

## 2021-09-18 ENCOUNTER — Ambulatory Visit: Payer: Medicare Other | Admitting: Pulmonary Disease

## 2021-10-15 ENCOUNTER — Telehealth: Payer: Self-pay | Admitting: Primary Care

## 2021-10-16 NOTE — Telephone Encounter (Signed)
Spoke with pt who states he thinks he already has a copy of the letter from the prior encounter. Pt says he will look for letter and call us back if he is unable to find it. Nothing further needed at this time.

## 2021-10-24 ENCOUNTER — Ambulatory Visit: Payer: Medicare Other | Admitting: Internal Medicine

## 2021-11-04 ENCOUNTER — Ambulatory Visit: Payer: Medicare Other | Admitting: Pulmonary Disease

## 2021-11-06 ENCOUNTER — Ambulatory Visit: Payer: Medicare Other | Admitting: Podiatry

## 2021-11-08 ENCOUNTER — Emergency Department (HOSPITAL_COMMUNITY)
Admission: EM | Admit: 2021-11-08 | Discharge: 2021-11-08 | Disposition: A | Discharge status: Home / Self Care | Payer: Medicare Other | Attending: Emergency Medicine | Admitting: Emergency Medicine

## 2021-11-08 ENCOUNTER — Emergency Department (HOSPITAL_COMMUNITY): Payer: Medicare Other

## 2021-11-08 ENCOUNTER — Encounter (HOSPITAL_COMMUNITY): Payer: Self-pay

## 2021-11-08 ENCOUNTER — Other Ambulatory Visit: Payer: Self-pay

## 2021-11-08 DIAGNOSIS — Z79899 Other long term (current) drug therapy: Secondary | ICD-10-CM | POA: Insufficient documentation

## 2021-11-08 DIAGNOSIS — Z7982 Long term (current) use of aspirin: Secondary | ICD-10-CM | POA: Diagnosis not present

## 2021-11-08 DIAGNOSIS — I110 Hypertensive heart disease with heart failure: Secondary | ICD-10-CM | POA: Insufficient documentation

## 2021-11-08 DIAGNOSIS — Z20822 Contact with and (suspected) exposure to covid-19: Secondary | ICD-10-CM | POA: Diagnosis not present

## 2021-11-08 DIAGNOSIS — R0789 Other chest pain: Secondary | ICD-10-CM

## 2021-11-08 DIAGNOSIS — I509 Heart failure, unspecified: Secondary | ICD-10-CM | POA: Insufficient documentation

## 2021-11-08 DIAGNOSIS — E119 Type 2 diabetes mellitus without complications: Secondary | ICD-10-CM | POA: Insufficient documentation

## 2021-11-08 DIAGNOSIS — R072 Precordial pain: Secondary | ICD-10-CM | POA: Diagnosis present

## 2021-11-08 DIAGNOSIS — Z7984 Long term (current) use of oral hypoglycemic drugs: Secondary | ICD-10-CM | POA: Diagnosis not present

## 2021-11-08 DIAGNOSIS — J45909 Unspecified asthma, uncomplicated: Secondary | ICD-10-CM | POA: Insufficient documentation

## 2021-11-08 LAB — SARS CORONAVIRUS 2 BY RT PCR: SARS Coronavirus 2 by RT PCR: NEGATIVE

## 2021-11-08 MED ORDER — LIDOCAINE 5 % EX PTCH: 1.0000 | MEDICATED_PATCH | CUTANEOUS | 0 refills | Status: DC

## 2021-11-08 MED ORDER — ACETAMINOPHEN 500 MG PO TABS
1000.0000 mg | ORAL_TABLET | Freq: Once | ORAL | Status: AC
  Administered 2021-11-08: 1000 mg via ORAL
  Filled 2021-11-08: qty 2

## 2021-11-08 MED ORDER — BENZONATATE 100 MG PO CAPS: 100.0000 mg | ORAL_CAPSULE | Freq: Three times a day (TID) | ORAL | 0 refills | Status: DC | PRN

## 2021-11-08 MED ORDER — LIDOCAINE 5 % EX PTCH
1.0000 | MEDICATED_PATCH | CUTANEOUS | Status: DC
  Administered 2021-11-08: 1 via TRANSDERMAL
  Filled 2021-11-08: qty 1

## 2021-11-08 NOTE — ED Provider Notes (Signed)
[facility] DEPARTMENT Provider Note   CSN: [id number] Arrival date & time: 11/08/21  0825     History  No chief complaint on file.   Thomas Ramos is a 51 y.o. male PMH cerebral palsy, quadriparesis, asthma, CHF, OSA, diabetes mellitus, hypertension who is presenting with rib/flank pain and cough.  Patient reports that his right-sided rib pain has been going on for "a while" and has not acutely worsened.  It is localized to the right side of his chest and does not radiate.  Nothing makes it worse.  Patient has not tried to take anything for pain to help it.  Patient also notes a feeling of "having to cough", and being "unable to cough anything up".  This has been going on for the last 7 to 10 days.  He denies any fever, nasal congestion, sore throat.  Denies any abdominal pain, nausea, vomiting, and dysuria, hematuria.  Home Medications Prior to Admission medications   Medication Sig Start Date End Date Taking? Authorizing Provider  Albuterol Sulfate (PROAIR RESPICLICK) 885 (90 Base) MCG/ACT AEPB Inhale 2 puffs into the lungs every 6 (six) hours as needed (Shortness of breath). 05/03/20   [provider]  amLODipine (NORVASC) 10 MG tablet Take 10 mg by mouth daily.    [provider]  aspirin 81 MG chewable tablet Chew 81 mg by mouth daily.    [provider]  baclofen (LIORESAL) 10 MG tablet Take 2.5 mg by mouth at bedtime. 06/01/18   [provider]  [employer] CLOTRIMAZOLE 1 % cream Apply 1 application topically 2 (two) times daily as needed (rash). BETWEEN TOES 08/19/17   [provider]  dextromethorphan 15 MG/5ML syrup Take 10 mLs (30 mg total) by mouth 4 (four) times daily as needed for cough. 06/20/21   Martyn Ehrich, NP  diclofenac Sodium (VOLTAREN) 1 % GEL Apply 1 application. topically daily as needed for pain. 06/02/21   [provider]  Dulaglutide 0.75 MG/0.5ML SOPN Inject 0.75 mg into the skin once a  week. Tuesdays 02/26/20   [provider]  empagliflozin (JARDIANCE) 25 MG TABS tablet 25 mg in the morning. 10/23/20   [provider]  famotidine (PEPCID) 20 MG tablet Take 1 tablet (20 mg total) by mouth daily. 06/20/21   Martyn Ehrich, NP  fluticasone (FLONASE) 50 MCG/ACT nasal spray Place 2 sprays into both nostrils daily. 06/20/21   Martyn Ehrich, NP  furosemide (LASIX) 20 MG tablet Take 20 mg by mouth daily. 10/23/20   [provider]  latanoprost (XALATAN) 0.005 % ophthalmic solution Place 1 drop into both eyes nightly. 08/25/21   [provider]  metoprolol succinate (TOPROL-XL) 25 MG 24 hr tablet Take 25 mg by mouth daily. 02/13/21   [provider]  montelukast (SINGULAIR) 10 MG tablet Take 1 tablet by mouth daily. 07/28/21   [provider]  naproxen sodium (ALEVE) 220 MG tablet Take 220-440 mg by mouth 2 (two) times daily as needed (for pain).    [provider]  nitroGLYCERIN (NITROSTAT) 0.4 MG SL tablet Place 0.4 mg under the tongue every 5 (five) minutes as needed for chest pain.     [provider]  omeprazole (PRILOSEC) 20 MG capsule Take 1 capsule by mouth every morning. 12/12/19   [provider]  rosuvastatin (CRESTOR) 5 MG tablet Take 5 mg by mouth at bedtime.    [provider]  timolol (TIMOPTIC) 0.5 % ophthalmic solution Place  1 drop into both eyes 2 (two) times daily. 04/10/20   [provider]  valsartan (DIOVAN) 80 MG tablet Take 80 mg by mouth daily.    [provider]      Allergies    Penicillins, Sulfamethoxazole-trimethoprim, and Metronidazole    Review of Systems   Review of Systems As in HPI.  Physical Exam Updated Vital Signs BP 135/71   Pulse (!) 104   Temp 98.6 F (37 C) (Oral)   Resp 18   SpO2 97%  Physical Exam Vitals and nursing note reviewed.  Constitutional:      General: He is not in acute distress.    Appearance: He is  well-developed.     Comments: Wheelchair bound.  HENT:     Head: Normocephalic and atraumatic.     Right Ear: External ear normal.     Left Ear: External ear normal.     Nose: Nose normal.  Neck:     Comments: No midline spine tenderness to palpation. Cardiovascular:     Rate and Rhythm: Normal rate and regular rhythm.     Heart sounds: No murmur heard. Pulmonary:     Effort: Pulmonary effort is normal. No respiratory distress.     Breath sounds: Normal breath sounds. No wheezing.  Abdominal:     Palpations: Abdomen is soft.     Tenderness: There is no abdominal tenderness. There is no guarding.  Musculoskeletal:        General: Tenderness present.     Cervical back: Neck supple. No tenderness.     Comments: Tenderness to palpation over right lateral ribs.  No overlying rash.  No right CVA tenderness.  Skin:    General: Skin is warm and dry.  Neurological:     Mental Status: He is alert.     ED Results / Procedures / Treatments   Labs (all labs ordered are listed, but only abnormal results are displayed) Labs Reviewed - No data to display  EKG None  Radiology DG Chest 2 View  Result Date: 11/08/2021 CLINICAL DATA:  Chest wall pain. EXAM: CHEST - 2 VIEW COMPARISON:  09/22/2020 FINDINGS: Low lung volumes are again demonstrated with elevation of right hemidiaphragm. Both lungs are clear. No evidence of pneumothorax or pleural effusion. Heart size is normal. IMPRESSION: Stable low lung volumes. No acute findings. Electronically Signed   By: Marlaine Hind M.D.   On: 11/08/2021 09:33    Procedures Procedures   Medications Ordered in ED Medications - No data to display  ED Course/ Medical Decision Making/ A&P                           Medical Decision Making Amount and/or Complexity of Data Reviewed Radiology: ordered.  Risk OTC drugs. Prescription drug management.   Thomas Ramos is a 51 y.o. male with PMH cerebral palsy, quadriparesis, asthma, CHF, OSA, diabetes  mellitus, hypertension who is presenting with rib/flank pain that has been going on for "a wild" and cough for the last week or so.  Patient is hemodynamically stable, afebrile, satting well on room air.  Physical exam reassuring.  Normal heart sounds.  Lungs clear to auscultation bilaterally.  Abdomen is soft, nontender to palpation.  No pitting edema.  Mild tenderness to palpation over right lateral ribs.  No CVA tenderness.  No overlying rash to the area.  Initial differential includes but is not limited to: Pleurisy, pneumonia, pneumothorax, viral URI, musculoskeletal pain,  rib fracture, herpes zoster, UTI, pyelonephritis, kidney stone  CXR independently interpreted by me.  Reveals no evidence of cardiomegaly, pleural effusion, pneumothorax.  Per radiology report, with stable low lung volumes.  Interventions include PO tylenol, lidocaine patch  Most likely cause of patient's pain today is viral URI in setting of cough versus musculoskeletal pain as it is reproducible to palpation on exam.  Patient without urinary symptoms and no CVA tenderness on exam, unlikely UTI, pyelonephritis.  Patient denies hematuria, unlikely nephrolithiasis.  Chest x-ray clear.  Unlikely pneumonia, pneumothorax.  Following application of lidocaine patch, patient reported symptomatic relief.  I believe patient is suitable for discharge home at this time.  Patient prescribed lidocaine patches as well as Tessalon Perles to help with reported cough.  The patient is safe and stable for discharge at this time with return precautions provided and a plan for follow-up care in place as needed.  The plan for this patient was discussed with Dr. Ronnald Nian, who voiced agreement and who oversaw evaluation and treatment of this patient.    Final Clinical Impression(s) / ED Diagnoses Final diagnoses:  Right-sided chest wall pain    Rx / DC Orders ED Discharge Orders          Ordered    lidocaine (LIDODERM) 5 %  Every 24 hours         11/08/21 1238    benzonatate (TESSALON) 100 MG capsule  3 times daily PRN        11/08/21 1238              Faylene Million, MD 11/08/21 [place], [place], DO 11/08/21 1308

## 2021-11-08 NOTE — ED Triage Notes (Signed)
Patient complains of rib/flank pain with reported cough x 7-10 days. Patient has cerebral palsy and wheelchair bound

## 2021-11-08 NOTE — ED Notes (Signed)
[facility] called for transport.  

## 2021-11-08 NOTE — Discharge Instructions (Addendum)
You were seen today for right sided rib pain.  You have been prescribed lidocaine patches.  You may use these once a day to help with your right-sided rib pain. You have also been prescribed Tessalon Perles.  You may take these  up to three times a day as needed to help with cough. Please follow-up with your primary care doctor in the next 2 to 3 days regarding today's visit and your symptoms. Please return to the emergency department if you experience any chest pain, difficulty breathing, abdominal pain, altered mental status, severe fever, or any other concerning symptom.  Thank you for allowing [place] to participate in your care.

## 2021-12-02 ENCOUNTER — Encounter: Payer: Self-pay | Admitting: Podiatry

## 2021-12-02 ENCOUNTER — Ambulatory Visit (INDEPENDENT_AMBULATORY_CARE_PROVIDER_SITE_OTHER): Payer: Medicare Other | Admitting: Podiatry

## 2021-12-02 DIAGNOSIS — B351 Tinea unguium: Secondary | ICD-10-CM | POA: Diagnosis not present

## 2021-12-02 DIAGNOSIS — M79674 Pain in right toe(s): Secondary | ICD-10-CM

## 2021-12-02 DIAGNOSIS — M79675 Pain in left toe(s): Secondary | ICD-10-CM

## 2021-12-02 DIAGNOSIS — E1142 Type 2 diabetes mellitus with diabetic polyneuropathy: Secondary | ICD-10-CM

## 2021-12-07 NOTE — Progress Notes (Signed)
  Subjective:  Patient ID: Thomas Ramos, male    DOB: 1970/10/18,  MRN: [id number]  KIJANA CROMIE presents to clinic today for at risk foot care with history of diabetic neuropathy and painful elongated mycotic toenails 1-5 bilaterally which are tender when wearing enclosed shoe gear. Pain is relieved with periodic professional debridement.  New problem(s): None.   PCP is [name] , and last visit was  September 02, 2021.  Allergies  Allergen Reactions   Penicillins Hives, Nausea And Vomiting and Other (See Comments)    Patient tolerated cefazolin in 2017 Has patient had a PCN reaction causing immediate rash, facial/tongue/throat swelling, SOB or lightheadedness with hypotension: Yes Has patient had a PCN reaction causing severe rash involving mucus membranes or skin necrosis: No Has patient had a PCN reaction that required hospitalization No Has patient had a PCN reaction occurring within the last 10 years: Yes If all of the above answers are "NO", then may proceed with Cephalosporin use.   Sulfamethoxazole-Trimethoprim Nausea And Vomiting   Metronidazole Nausea And Vomiting    Review of Systems: Negative except as noted in the HPI.  Objective: No changes noted in today's physical examination. Thomas Ramos is a pleasant 51 y.o. male in NAD. AAO x 3.  Vascular Examination: CFT <3 seconds b/l LE. Palpable pedal pulses b/l LE. Pedal hair sparse. No pain with calf compression b/l. Lower extremity skin temperature gradient within normal limits. Dependent edema noted b/l LE. No cyanosis or clubbing noted b/l LE.  Dermatological Examination: No open wounds b/l LE. No interdigital macerations noted b/l LE. Toenails 1-5 b/l elongated, discolored, dystrophic, thickened, crumbly with subungual debris and tenderness to dorsal palpation. No hyperkeratotic nor porokeratotic lesions present on today's visit. Scarring from previous blisters anterior ankles. Completely  healed.  Musculoskeletal Examination: Flaccid lower extremity noted b/l lower extremities. Overlapping great toe left foot. Pes planus deformity noted bilateral LE. Utilizes motorized chair for mobility assistance.  Neurological:  Pt has subjective symptoms of neuropathy.  Protective sensation intact 5/5 intact bilaterally with 10g monofilament b/l.  Assessment/Plan: 1. Pain due to onychomycosis of toenails of both feet   2. Diabetic peripheral neuropathy associated with type 2 diabetes mellitus ([place])     -Patient was evaluated and treated. All patient's and/or POA's questions/concerns answered on today's visit. -Examined patient. -Patient to continue soft, supportive shoe gear daily. -Mycotic toenails 1-5 bilaterally were debrided in length and girth with sterile nail nippers and dremel without incident. -Patient/POA to call should there be question/concern in the interim.   Return in about 3 months (around 03/03/2022).  Marzetta Board, DPM

## 2021-12-16 ENCOUNTER — Telehealth: Payer: Self-pay

## 2021-12-16 ENCOUNTER — Encounter: Payer: Self-pay | Admitting: Internal Medicine

## 2021-12-16 ENCOUNTER — Ambulatory Visit: Payer: Medicare Other | Attending: Internal Medicine | Admitting: Internal Medicine

## 2021-12-16 VITALS — BP 120/82 | HR 104 | Ht 66.0 in | Wt 147.0 lb

## 2021-12-16 DIAGNOSIS — R9431 Abnormal electrocardiogram [ECG] [EKG]: Secondary | ICD-10-CM | POA: Insufficient documentation

## 2021-12-16 DIAGNOSIS — R5383 Other fatigue: Secondary | ICD-10-CM

## 2021-12-16 DIAGNOSIS — I10 Essential (primary) hypertension: Secondary | ICD-10-CM

## 2021-12-16 DIAGNOSIS — I5032 Chronic diastolic (congestive) heart failure: Secondary | ICD-10-CM | POA: Diagnosis present

## 2021-12-16 DIAGNOSIS — R000 Tachycardia, unspecified: Secondary | ICD-10-CM | POA: Diagnosis not present

## 2021-12-16 DIAGNOSIS — E785 Hyperlipidemia, unspecified: Secondary | ICD-10-CM | POA: Diagnosis present

## 2021-12-16 DIAGNOSIS — I872 Venous insufficiency (chronic) (peripheral): Secondary | ICD-10-CM | POA: Diagnosis present

## 2021-12-16 NOTE — Telephone Encounter (Signed)
Patient came to appointment today with Dr. Margaretann Loveless. Patient requested help to use the restroom. This RN and Patria Mane, RN assisted patient with using the urinal. Patient was noted to have an insect crawling out of his clothing. Patient otherwise in a healthy state with and in no obvious distress. Social worker made aware and will look into contacting facility to inquire.

## 2021-12-16 NOTE — Telephone Encounter (Signed)
H&V Care Navigation CSW Progress Note  Clinical Social Worker  was contacted by RN  to f/u on concerns related to bugs on pt during visit. Per her report pt otherwise not in any acute distress but she wants to ensure pt has clean living environment. LCSW performed chart review noted he has been living in [facility] Home settings. Previous contact for home was Gemma Payor Ph- [phone] Fx- [phone]. I called [facility] [employer] ([phone]) to inquire about best way to contact the home/make report of pests. Will reattempt if no return call within 48 hrs. RN may have to contact APS report line to make formal report if no prompt return call. I remain available.   Patient is participating in a Managed Medicaid Plan:  No, Traditional Medicare and Medicaid.   SDOH Screenings   Tobacco Use: Medium Risk (12/16/2021)    Thomas Ramos, MSW, [place]  [phone]- work cell phone (preferred) [phone]- desk phone

## 2021-12-16 NOTE — Patient Instructions (Addendum)
Medication Instructions:  No Changes In Medications at this time.  *If you need a refill on your cardiac medications before your next appointment, please call your pharmacy*  Follow-Up: At [facility], you and your health needs are our priority.  As part of our continuing mission to provide you with exceptional heart care, we have created designated Provider Care Teams.  These Care Teams include your primary Cardiologist (physician) and Advanced Practice Providers (APPs -  Physician Assistants and Nurse Practitioners) who all work together to provide you with the care you need, when you need it.  Your next appointment:   1 year(s)  The format for your next appointment:   In Person  Provider:   Dr. Margaretann Loveless

## 2021-12-16 NOTE — Telephone Encounter (Signed)
H&V Care Navigation CSW Progress Note  Clinical Social Worker  received call back from Lovell Sheehan with [employer] ([phone])  to f/u on concerns reported by nurse. She looked up listed address ([address], [place], [place], [address]) and contact previously noted by [facility]  Ph- [phone]) and does not find documentation that they manage that home. She encouraged me to have RN call in APS report as cautionary measure regarding pests since pt does reside in group facility. LCSW has let Eliezer Lofts, RN, know the above. Number for report is : [phone]; After hours: [phone]  I remain available as needed.   Patient is participating in a Managed Medicaid Plan:  No, Traditional Medicare and Medicaid.   SDOH Screenings   Tobacco Use: Medium Risk (12/16/2021)   Westley Hummer, MSW, [place]  [phone]- work cell phone (preferred) [phone]- desk phone

## 2021-12-16 NOTE — Progress Notes (Signed)
Cardiology Office Note:    Date:  12/16/2021   ID:  Thomas Ramos, DOB 12-May-1970, MRN [id number]  PCP:  Aura Dials, PA-C  Cardiologist:  Elouise Munroe, MD  Electrophysiologist:  None   Referring MD: Aura Dials, PA-C   Chief Complaint/Reason for Referral: Follow up fatigue, murmur  History of Present Illness:    Thomas Ramos is a 51 y.o. male with a history of chronic diastolic heart failure, hypertension, hyperlipidemia, GERD, diabetes, and cerebral palsy coming in today for follow-up.  Today, he is doing well. His metoprolol was discontinued because of fatigue. He has been feeling much better since then. His medications are ordered through his pharmacy and his aide typically helps with administering his medicine. He presents alone for his visit today.   Recently lost his brother who he is close to. This has been extremely difficult for him. Keeps in touch with his brother's wife and his mother who is elderly, which helps.  Previous visit: We discussed his echo (5/27). He does not have any major concerns. Hyperdynamic LV noted with no other structural concerns. Suspect murmur is flow related.   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[place])    S/P Botox injection    approx every 4 months   Seasonal allergies     Past Surgical History:  Procedure Laterality Date   BIOPSY  06/24/2021   Procedure: BIOPSY;  Surgeon: Doran Stabler, MD;  Location: [facility];  Service: Gastroenterology;;   BOTOX INJECTION  N/A 12/21/2012   Procedure: BOTOX INJECTION;  Surgeon: Inda Castle, MD;  Location: [facility];  Service: Endoscopy;  Laterality: N/A;   BOTOX INJECTION N/A 06/28/2014   Procedure: BOTOX INJECTION;  Surgeon: Inda Castle, MD;  Location: [place];  Service: Endoscopy;  Laterality: N/A;   COLONOSCOPY WITH PROPOFOL N/A 06/24/2021   Procedure: COLONOSCOPY WITH PROPOFOL;  Surgeon: Doran Stabler, MD;  Location: [facility];  Service: Gastroenterology;  Laterality: N/A;   ESOPHAGOGASTRODUODENOSCOPY N/A 12/21/2012   Procedure: ESOPHAGOGASTRODUODENOSCOPY (EGD);  Surgeon: Inda Castle, MD;  Location: [facility];  Service: Endoscopy;  Laterality: N/A;   ESOPHAGOGASTRODUODENOSCOPY N/A 06/28/2014   Procedure: ESOPHAGOGASTRODUODENOSCOPY (EGD);  Surgeon: Inda Castle, MD;  Location: [place];  Service: Endoscopy;  Laterality: N/A;   ESOPHAGOGASTRODUODENOSCOPY N/A 06/24/2021   Procedure: ESOPHAGOGASTRODUODENOSCOPY (EGD);  Surgeon: Doran Stabler, MD;  Location: [facility];  Service: Gastroenterology;  Laterality: N/A;   ESOPHAGOGASTRODUODENOSCOPY (EGD) WITH PROPOFOL N/A 09/03/2017   Procedure: ESOPHAGOGASTRODUODENOSCOPY (EGD) WITH PROPOFOL;  Surgeon: Doran Stabler, MD;  Location: [facility];  Service: Gastroenterology;  Laterality: N/A;   ESOPHAGUS SURGERY     stretched esophagus   legs     MOUTH SURGERY     POLYPECTOMY  06/24/2021   Procedure: POLYPECTOMY;  Surgeon: Doran Stabler, MD;  Location: [facility];  Service: Gastroenterology;;   TENDON RELEASE      Current  Medications: Current Meds  Medication Sig   Albuterol Sulfate (PROAIR RESPICLICK) 161 (90 Base) MCG/ACT AEPB Inhale 2 puffs into the lungs every 6 (six) hours as needed (Shortness of breath).   amLODipine (NORVASC) 10 MG tablet Take by mouth.   aspirin 81 MG chewable tablet Chew 81 mg by mouth daily.   baclofen (LIORESAL) 10 MG tablet Take 2.5 mg by mouth at bedtime.   benzonatate (TESSALON) 100 MG  capsule Take 1 capsule (100 mg total) by mouth 3 (three) times daily as needed for cough.   [employer] CLOTRIMAZOLE 1 % cream Apply 1 application topically 2 (two) times daily as needed (rash). BETWEEN TOES   dextromethorphan 15 MG/5ML syrup Take 10 mLs (30 mg total) by mouth 4 (four) times daily as needed for cough.   diclofenac Sodium (VOLTAREN) 1 % GEL Apply 1 application. topically daily as needed for pain.   Dulaglutide 0.75 MG/0.5ML SOPN Inject 0.75 mg into the skin once a week. Tuesdays   empagliflozin (JARDIANCE) 25 MG TABS tablet 25 mg in the morning.   famotidine (PEPCID) 20 MG tablet Take 1 tablet (20 mg total) by mouth daily.   fluticasone (FLONASE) 50 MCG/ACT nasal spray Place 2 sprays into both nostrils daily.   furosemide (LASIX) 20 MG tablet Take 20 mg by mouth daily.   latanoprost (XALATAN) 0.005 % ophthalmic solution Place 1 drop into both eyes nightly.   lidocaine (LIDODERM) 5 % Place 1 patch onto the skin daily. Remove & Discard patch within 12 hours or as directed by MD   metoprolol succinate (TOPROL-XL) 25 MG 24 hr tablet Take 25 mg by mouth daily.   montelukast (SINGULAIR) 10 MG tablet Take 1 tablet by mouth daily.   naproxen sodium (ALEVE) 220 MG tablet Take 220-440 mg by mouth 2 (two) times daily as needed (for pain).   nitroGLYCERIN (NITROSTAT) 0.4 MG SL tablet Place 0.4 mg under the tongue every 5 (five) minutes as needed for chest pain.    omeprazole (PRILOSEC) 20 MG capsule Take 1 capsule by mouth every morning.   rosuvastatin (CRESTOR) 5 MG tablet Take 5 mg by mouth at bedtime.   timolol (TIMOPTIC) 0.5 % ophthalmic solution Place 1 drop into both eyes 2 (two) times daily.   valsartan (DIOVAN) 80 MG tablet Take 80 mg by mouth daily.     Allergies:   Penicillins, Sulfamethoxazole-trimethoprim, and Metronidazole   Social History   Tobacco Use   Smoking status: Never   Smokeless tobacco: Former   Tobacco comments:    smoked one time  Vaping Use   Vaping Use: Never  used  Substance Use Topics   Alcohol use: Yes    Comment: 2 beers every other Friday and Saturday   Drug use: No     Family History: The patient's family history includes Diabetes in his mother; Hypertension in his father; Lung cancer in his father.  ROS:   Please see the history of present illness.    All other systems reviewed and are negative.  EKGs/Labs/Other Studies Reviewed:    The following studies were reviewed today: Echo 08/09/20 1. Left ventricular ejection fraction, by estimation, is 70 to 75%. The  left ventricle has hyperdynamic function. The left ventricle has no  regional wall motion abnormalities. There is mild left ventricular  hypertrophy. Left ventricular diastolic  parameters are consistent with Grade I diastolic dysfunction (impaired  relaxation).   2. Right ventricular systolic function is normal. The right ventricular  size is normal.  3. The mitral valve is normal in structure. No evidence of mitral valve  regurgitation. No evidence of mitral stenosis.   4. The aortic valve is normal in structure. Aortic valve regurgitation is  not visualized. No aortic stenosis is present.   5. The inferior vena cava is normal in size with greater than 50%  respiratory variability, suggesting right atrial pressure of 3 mmHg.   EKG:   12/16/21: Sinus tachy rate 104, NS T wave abnl 01/09/21: No EKG ordered today 04/25/20: Sinus tachycardia Rate 110 bpm  Imaging studies that I have independently reviewed today: n/a  Recent Labs: 06/23/2021: BUN 16; Creatinine, Ser 0.78; Hemoglobin 14.5; Platelets 213; Potassium 3.3; Sodium 136  Recent Lipid Panel    Component Value Date/Time   CHOL 194 04/17/2010 2056   TRIG 86 09/20/2020 2151   HDL 27 (L) 04/17/2010 2056   CHOLHDL 7.2 Ratio 04/17/2010 2056   VLDL 35 04/17/2010 2056   LDLCALC 132 (H) 04/17/2010 2056    Physical Exam:    VS:  BP 120/82   Pulse (!) 104   Ht '5\' 6"'$  (1.676 m)   Wt 147 lb (66.7 kg)   SpO2 97%    BMI 23.73 kg/m     Wt Readings from Last 5 Encounters:  12/16/21 147 lb (66.7 kg)  06/23/21 149 lb 11.1 oz (67.9 kg)  09/20/20 154 lb 5.2 oz (70 kg)  04/25/20 145 lb (65.8 kg)  01/08/20 190 lb (86.2 kg)    Constitutional: No acute distress Eyes: sclera non-icteric, normal conjunctiva and lids ENMT: moist mucous membranes Cardiovascular: regular rhythm, tachycardic, soft systolic murmur. S1 and S2 normal. No jugular venous distention.  Respiratory: clear to auscultation bilaterally GI : normal bowel sounds, soft and nontender. No distention.   MSK: no edema. MSK deformities related to CP NEURO: grossly nonfocal exam, moves all extremities. PSYCH: alert and oriented x 3, normal mood and affect.   ASSESSMENT:    1. Essential hypertension     PLAN:    Fatigue, unspecified type - improved off of metoprolol.  -We discussed in shared decision making treating sinus tachycardia vs observation and patient much prefers strategy of observation. Can consider diltiazem but with normal echo would not aggressively treat sinus tach.   Tachycardia Abnormal electrocardiogram (ECG) (EKG)  - no worrisome findings on echo.  Essential hypertension - continue amlodipine 10 mg daily, furosemide 20 mg daily, and valsartan 80 mg daily. BP normal today.    Chronic diastolic (congestive) heart failure (HCC) - euvolemic today. Continue furosemide 20 mg daily.  Chronic venous insufficiency - no concerns today.   Hyperlipidemia, unspecified hyperlipidemia type - continue crestor 5 mg daily. LDL and trig both have improved, both approaching goal.   Total time of encounter: 30 minutes total time of encounter, including 20 minutes spent in face-to-face patient care on the date of this encounter. This time includes coordination of care and counseling regarding above mentioned problem list. Remainder of non-face-to-face time involved reviewing chart documents/testing relevant to the patient encounter  and documentation in the medical record. I have independently reviewed documentation from referring provider.   Cherlynn Kaiser, MD, [place]   Shared Decision Making/Informed Consent:       Medication Adjustments/Labs and Tests Ordered: Current medicines are reviewed at length with the patient today.  Concerns regarding medicines are outlined above.   Orders Placed This Encounter  Procedures   EKG 12-Lead     No orders of the defined  types were placed in this encounter.    Patient Instructions  Medication Instructions:  No Changes In Medications at this time.  *If you need a refill on your cardiac medications before your next appointment, please call your pharmacy*  Follow-Up: At [facility], you and your health needs are our priority.  As part of our continuing mission to provide you with exceptional heart care, we have created designated Provider Care Teams.  These Care Teams include your primary Cardiologist (physician) and Advanced Practice Providers (APPs -  Physician Assistants and Nurse Practitioners) who all work together to provide you with the care you need, when you need it.  Your next appointment:   1 year(s)  The format for your next appointment:   In Person  Provider:   Dr. Margaretann Loveless

## 2021-12-16 NOTE — Telephone Encounter (Addendum)
[place], [place]  You 31 minutes ago (4:26 PM)   IC [phone]; After hours: [phone]  I recommend letting them know about pests, lack of assistance w/ coming to appts.  I spoke with Adult Care Licensure lead Lovell Sheehan who recommended calling report.  Let me know if any issues!     Report has been placed to Adult [employer]. They are aware of situation and will determine next steps. Call back number to office provided if needed.

## 2021-12-17 NOTE — Telephone Encounter (Signed)
Received call from social worker at Adult protective services, per Denman George- they have accepted the report and are sending someone to the residence to investigate concerns.

## 2022-01-15 ENCOUNTER — Telehealth: Payer: Self-pay | Admitting: Pulmonary Disease

## 2022-01-19 NOTE — Telephone Encounter (Signed)
ATC x1.  LVM to return call. 

## 2022-01-19 NOTE — Telephone Encounter (Signed)
Patient is returning phone call. Phone number is [phone].

## 2022-01-19 NOTE — Telephone Encounter (Signed)
Spoke with pt who was asking if he should be wearing his C-pap at least 4 hours every night. Pt was instructed  that he should be. Pt wanted to come in for OV with Beth which he was scheduled for. Nothing further needed at this time.

## 2022-03-17 ENCOUNTER — Ambulatory Visit: Payer: Medicare Other | Admitting: Primary Care

## 2022-04-01 ENCOUNTER — Ambulatory Visit: Payer: Medicare Other | Admitting: Podiatry

## 2022-04-01 ENCOUNTER — Encounter: Payer: Medicare Other | Admitting: Primary Care

## 2022-04-01 ENCOUNTER — Telehealth: Payer: Self-pay | Admitting: Primary Care

## 2022-04-01 ENCOUNTER — Encounter: Payer: Self-pay | Admitting: Primary Care

## 2022-04-01 NOTE — Progress Notes (Signed)
 Erroneous encounter

## 2022-04-01 NOTE — Telephone Encounter (Signed)
Patient came to visit today but had to leave early due to transportation.  He was unable to be seen for a full visit.  He asked to be provided a letter noting his refusal to wear CPAP. He has history moderate-severe sleep apnea. He understands risks of not wearing CPAP including cardiac arrhythmias, pulmonary HTN, stroke and diabetes. He also does not want to wear nocturnal oxygen. SpO2 low on HST was 81% (average 92%). He reports sleeping well at night. Not waking up gasping for air or short of breath. Advised he sleep with head elevated.

## 2022-04-01 NOTE — Telephone Encounter (Signed)
Noted  

## 2022-04-13 ENCOUNTER — Telehealth: Payer: Self-pay

## 2022-04-13 NOTE — Telephone Encounter (Signed)
Pt called into the office today. He states that he is having diarrhea again. I advised pt that he can use 1 Imodium PRN. Pt states that he has also been having diarrhea whenever he urinates. Pt wanted to be scheduled for a follow up with Dr. Loletha Carrow, he is scheduled for Monday, 04/20/22 at 3:40 pm. Pt had no other concerns at the end of the call.

## 2022-04-20 ENCOUNTER — Ambulatory Visit (INDEPENDENT_AMBULATORY_CARE_PROVIDER_SITE_OTHER): Payer: Medicare Other | Admitting: Gastroenterology

## 2022-04-20 ENCOUNTER — Encounter: Payer: Self-pay | Admitting: Gastroenterology

## 2022-04-20 DIAGNOSIS — K5909 Other constipation: Secondary | ICD-10-CM | POA: Diagnosis not present

## 2022-04-20 DIAGNOSIS — N39490 Overflow incontinence: Secondary | ICD-10-CM

## 2022-04-20 NOTE — Progress Notes (Signed)
Thomas Ramos GI Progress Note  Chief Complaint: "Diarrhea"  Subjective  History: From my May 2023 office note: "Thomas Ramos was seen in follow-up after recent inpatient procedures.  He has chronic dysphagia from esophageal tortuosity and hiatal hernia as well as diarrhea that seem most likely due to metformin or perhaps constipation and overflow.  Due to his CP and limited mobility he was admitted to the hospital for endoscopic testing on 06/24/2021. EGD revealed a large hiatal hernia (1/3-1/2 of the stomach intrathoracic) with distal esophageal tortuosity.  No fixed stricture or other mechanical lesion amenable to endoscopic dilation. Colonoscopy was a challenging procedure due to his body habitus and a redundant colon causing significant scope looping.  After 30 minutes of effort, the ileocecal valve was reached and the cecal base could not be seen due to scope looping and poor bowel prep.  Random colon biopsies negative for microscopic colitis, 4 subcentimeter tubular adenomas removed.  Left and right-sided diverticulosis found. My impression was that his diarrhea appeared to be most likely overflow from chronic constipation (CP, contracted body habitus, limited mobility, GLP-1 agonist), since he told me he had stopped taking metformin.   Thomas Ramos wanted to discuss his bowel habits and findings in the time of colonoscopy and endoscopy.  All these results were reviewed in detail and questions answered.  He agrees that he tends toward constipation but about every 10 to 14 days wants to take 1 or 2 Imodium tablets if he needs to travel to [place] or go to work without having worries about an episode of diarrhea and incontinence."  Additional colon Bx ruled out micro colitis ______________________  Clinical is here today with similar concerns as before.  He does not have a BM every day, but intermittently will have "an accident" that may occur when he is out of the house or sometimes while sleeping.   He wondered if any of his medicines might be causing diarrhea.  He still takes Imodium sometimes if he is traveling out of the house he does not have an accident, and wanted to know if that was okay to do.  (See prior office visits for similar conversation).  ROS: Cardiovascular:  no chest pain Respiratory: no dyspnea  The patient's Past Medical, Family and Social History were reviewed and are on file in the EMR.  Objective:  Med list reviewed  Current Outpatient Medications:    Albuterol Sulfate (PROAIR RESPICLICK) 409 (90 Base) MCG/ACT AEPB, Inhale 2 puffs into the lungs every 6 (six) hours as needed (Shortness of breath)., Disp: , Rfl:    amLODipine (NORVASC) 10 MG tablet, Take by mouth., Disp: , Rfl:    aspirin 81 MG chewable tablet, Chew 81 mg by mouth daily., Disp: , Rfl:    baclofen (LIORESAL) 10 MG tablet, Take 2.5 mg by mouth at bedtime., Disp: , Rfl:    benzonatate (TESSALON) 100 MG capsule, Take 1 capsule (100 mg total) by mouth 3 (three) times daily as needed for cough., Disp: 21 capsule, Rfl: 0   [employer] CLOTRIMAZOLE 1 % cream, Apply 1 application topically 2 (two) times daily as needed (rash). BETWEEN TOES, Disp: , Rfl: 1   dextromethorphan 15 MG/5ML syrup, Take 10 mLs (30 mg total) by mouth 4 (four) times daily as needed for cough. (Patient not taking: Reported on 04/01/2022), Disp: 120 mL, Rfl: 0   diclofenac Sodium (VOLTAREN) 1 % GEL, Apply 1 application. topically daily as needed for pain., Disp: , Rfl:    Dulaglutide 0.75 MG/0.5ML  SOPN, Inject 0.75 mg into the skin once a week. Tuesdays, Disp: , Rfl:    empagliflozin (JARDIANCE) 25 MG TABS tablet, 25 mg in the morning. (Patient not taking: Reported on 04/01/2022), Disp: , Rfl:    famotidine (PEPCID) 20 MG tablet, Take 1 tablet (20 mg total) by mouth daily., Disp: 30 tablet, Rfl: 0   fluticasone (FLONASE) 50 MCG/ACT nasal spray, Place 2 sprays into both nostrils daily., Disp: 16 g, Rfl: 2   furosemide (LASIX) 20 MG tablet, Take  20 mg by mouth daily. (Patient not taking: Reported on 04/01/2022), Disp: , Rfl:    latanoprost (XALATAN) 0.005 % ophthalmic solution, Place 1 drop into both eyes nightly., Disp: , Rfl:    lidocaine (LIDODERM) 5 %, Place 1 patch onto the skin daily. Remove & Discard patch within 12 hours or as directed by MD (Patient not taking: Reported on 04/01/2022), Disp: 30 patch, Rfl: 0   metoprolol succinate (TOPROL-XL) 25 MG 24 hr tablet, Take 25 mg by mouth daily., Disp: , Rfl:    montelukast (SINGULAIR) 10 MG tablet, Take 1 tablet by mouth daily., Disp: , Rfl:    naproxen sodium (ALEVE) 220 MG tablet, Take 220-440 mg by mouth 2 (two) times daily as needed (for pain)., Disp: , Rfl:    nitroGLYCERIN (NITROSTAT) 0.4 MG SL tablet, Place 0.4 mg under the tongue every 5 (five) minutes as needed for chest pain. , Disp: , Rfl:    omeprazole (PRILOSEC) 20 MG capsule, Take 1 capsule by mouth every morning., Disp: , Rfl:    rosuvastatin (CRESTOR) 5 MG tablet, Take 5 mg by mouth at bedtime., Disp: , Rfl:    timolol (TIMOPTIC) 0.5 % ophthalmic solution, Place 1 drop into both eyes 2 (two) times daily., Disp: , Rfl:    valsartan (DIOVAN) 80 MG tablet, Take 80 mg by mouth daily., Disp: , Rfl:    Vital signs in last 24 hrs: There were no vitals filed for this visit. Wt Readings from Last 3 Encounters:  04/01/22 147 lb (66.7 kg)  12/16/21 147 lb (66.7 kg)  06/23/21 149 lb 11.1 oz (67.9 kg)    Physical Exam  Contracted body habitus, dysarthric, right hemiplegia Wheelchair-bound Abdomen nondistended soft with normal bowel sounds, nontender  Labs:   ___________________________________________ Radiologic studies:   ____________________________________________ Other:   _____________________________________________ Assessment & Plan  Assessment: Encounter Diagnoses  Name Primary?   Chronic constipation Yes   Overflow incontinence    I have reiterated that I believe the underlying problem is constipation  due to slow motility and redundant colon anatomy.  I think he is having episodes of overflow incontinence, though it is difficult to tell if he entirely believes that.  I do not think any of his medicines are culprits, and he has had a colonoscopic workup as noted above. Previously recommended at least stool softeners, and would not advocate prescription laxatives as I think he may have worsening incontinence of the stool becomes loose. Can try low-dose MiraLAX such as half a capful a day.  With.  I do not think any further have to offer for this, which is understandably frustrating for him.  Permanent colostomy is a consideration (albeit a decidedly challenging surgery given his body habitus), but he has previously not had interest in surgery including feeding tube for his dysphagia.   Glad to see him again as issues arise.  [name]

## 2022-04-20 NOTE — Patient Instructions (Signed)
_______________________________________________________  If your blood pressure at your visit was 140/90 or greater, please contact your primary care physician to follow up on this.  _______________________________________________________  If you are age 50 or older, your body mass index should be between 23-30. Your There is no height or weight on file to calculate BMI. If this is out of the aforementioned range listed, please consider follow up with your Primary Care Provider.  If you are age 71 or younger, your body mass index should be between 19-25. Your There is no height or weight on file to calculate BMI. If this is out of the aformentioned range listed, please consider follow up with your Primary Care Provider.   ________________________________________________________  The [place] [facility] providers would like to encourage you to use [facility] to communicate with providers for non-urgent requests or questions.  Due to long hold times on the telephone, sending your provider a message by [facility] may be a faster and more efficient way to get a response.  Please allow 48 business hours for a response.  Please remember that this is for non-urgent requests.  _______________________________________________________   It was a pleasure to see you today!  Thank you for trusting me with your gastrointestinal care!

## 2022-07-06 ENCOUNTER — Ambulatory Visit: Payer: Medicare Other | Admitting: Podiatry

## 2022-07-08 ENCOUNTER — Ambulatory Visit: Payer: Medicare Other | Admitting: Podiatry

## 2022-07-13 ENCOUNTER — Inpatient Hospital Stay (HOSPITAL_COMMUNITY)
Admission: EM | Admit: 2022-07-13 | Discharge: 2022-07-16 | DRG: 178 | Disposition: A | Discharge status: Home / Self Care | Payer: Medicare Other | Attending: Student in an Organized Health Care Education/Training Program | Admitting: Student in an Organized Health Care Education/Training Program

## 2022-07-13 ENCOUNTER — Emergency Department (HOSPITAL_COMMUNITY): Payer: Medicare Other

## 2022-07-13 ENCOUNTER — Observation Stay (HOSPITAL_COMMUNITY): Payer: Medicare Other

## 2022-07-13 ENCOUNTER — Encounter (HOSPITAL_COMMUNITY): Payer: Self-pay

## 2022-07-13 ENCOUNTER — Other Ambulatory Visit: Payer: Self-pay

## 2022-07-13 DIAGNOSIS — Z8249 Family history of ischemic heart disease and other diseases of the circulatory system: Secondary | ICD-10-CM

## 2022-07-13 DIAGNOSIS — U071 COVID-19: Principal | ICD-10-CM | POA: Diagnosis present

## 2022-07-13 DIAGNOSIS — Z801 Family history of malignant neoplasm of trachea, bronchus and lung: Secondary | ICD-10-CM

## 2022-07-13 DIAGNOSIS — Z7984 Long term (current) use of oral hypoglycemic drugs: Secondary | ICD-10-CM

## 2022-07-13 DIAGNOSIS — Z7985 Long-term (current) use of injectable non-insulin antidiabetic drugs: Secondary | ICD-10-CM

## 2022-07-13 DIAGNOSIS — G4733 Obstructive sleep apnea (adult) (pediatric): Secondary | ICD-10-CM | POA: Diagnosis present

## 2022-07-13 DIAGNOSIS — K219 Gastro-esophageal reflux disease without esophagitis: Secondary | ICD-10-CM | POA: Diagnosis present

## 2022-07-13 DIAGNOSIS — G809 Cerebral palsy, unspecified: Secondary | ICD-10-CM | POA: Diagnosis present

## 2022-07-13 DIAGNOSIS — Z883 Allergy status to other anti-infective agents status: Secondary | ICD-10-CM

## 2022-07-13 DIAGNOSIS — Z880 Allergy status to penicillin: Secondary | ICD-10-CM

## 2022-07-13 DIAGNOSIS — Z79899 Other long term (current) drug therapy: Secondary | ICD-10-CM

## 2022-07-13 DIAGNOSIS — G8250 Quadriplegia, unspecified: Secondary | ICD-10-CM | POA: Diagnosis present

## 2022-07-13 DIAGNOSIS — Z882 Allergy status to sulfonamides status: Secondary | ICD-10-CM

## 2022-07-13 DIAGNOSIS — I10 Essential (primary) hypertension: Secondary | ICD-10-CM | POA: Diagnosis present

## 2022-07-13 DIAGNOSIS — I5032 Chronic diastolic (congestive) heart failure: Secondary | ICD-10-CM | POA: Diagnosis present

## 2022-07-13 DIAGNOSIS — I110 Hypertensive heart disease with heart failure: Secondary | ICD-10-CM | POA: Diagnosis present

## 2022-07-13 DIAGNOSIS — Z833 Family history of diabetes mellitus: Secondary | ICD-10-CM

## 2022-07-13 DIAGNOSIS — E118 Type 2 diabetes mellitus with unspecified complications: Secondary | ICD-10-CM | POA: Diagnosis present

## 2022-07-13 DIAGNOSIS — Z7982 Long term (current) use of aspirin: Secondary | ICD-10-CM

## 2022-07-13 DIAGNOSIS — E119 Type 2 diabetes mellitus without complications: Secondary | ICD-10-CM | POA: Diagnosis present

## 2022-07-13 LAB — CBC WITH DIFFERENTIAL/PLATELET
Abs Immature Granulocytes: 0.02 10*3/uL (ref 0.00–0.07)
Basophils Absolute: 0 10*3/uL (ref 0.0–0.1)
Basophils Relative: 0 %
Eosinophils Absolute: 0.1 10*3/uL (ref 0.0–0.5)
Eosinophils Relative: 1 %
HCT: 44.5 % (ref 39.0–52.0)
Hemoglobin: 14.7 g/dL (ref 13.0–17.0)
Immature Granulocytes: 0 %
Lymphocytes Relative: 14 %
Lymphs Abs: 1 10*3/uL (ref 0.7–4.0)
MCH: 27.5 pg (ref 26.0–34.0)
MCHC: 33 g/dL (ref 30.0–36.0)
MCV: 83.2 fL (ref 80.0–100.0)
Monocytes Absolute: 0.5 10*3/uL (ref 0.1–1.0)
Monocytes Relative: 7 %
Neutro Abs: 5.2 10*3/uL (ref 1.7–7.7)
Neutrophils Relative %: 78 %
Platelets: 217 10*3/uL (ref 150–400)
RBC: 5.35 MIL/uL (ref 4.22–5.81)
RDW: 12.1 % (ref 11.5–15.5)
WBC: 6.8 10*3/uL (ref 4.0–10.5)
nRBC: 0 % (ref 0.0–0.2)

## 2022-07-13 LAB — RESP PANEL BY RT-PCR (RSV, FLU A&B, COVID)  RVPGX2
Influenza A by PCR: NEGATIVE
Influenza B by PCR: NEGATIVE
Resp Syncytial Virus by PCR: NEGATIVE
SARS Coronavirus 2 by RT PCR: POSITIVE — AB

## 2022-07-13 LAB — TROPONIN I (HIGH SENSITIVITY)
Troponin I (High Sensitivity): 3 ng/L (ref <18)
Troponin I (High Sensitivity): 3 ng/L (ref <18)

## 2022-07-13 LAB — COMPREHENSIVE METABOLIC PANEL
ALT: 13 U/L (ref 0–44)
AST: 16 U/L (ref 15–41)
Albumin: 4.1 g/dL (ref 3.5–5.0)
Alkaline Phosphatase: 109 U/L (ref 38–126)
Anion gap: 12 (ref 5–15)
BUN: 15 mg/dL (ref 6–20)
CO2: 25 mmol/L (ref 22–32)
Calcium: 9.3 mg/dL (ref 8.9–10.3)
Chloride: 99 mmol/L (ref 98–111)
Creatinine, Ser: 1.1 mg/dL (ref 0.61–1.24)
GFR, Estimated: >60 mL/min (ref >60)
Glucose, Bld: 138 mg/dL — ABNORMAL HIGH (ref 70–99)
Potassium: 4 mmol/L (ref 3.5–5.1)
Sodium: 136 mmol/L (ref 135–145)
Total Bilirubin: 0.8 mg/dL (ref 0.3–1.2)
Total Protein: 7 g/dL (ref 6.5–8.1)

## 2022-07-13 LAB — LACTIC ACID, PLASMA
Lactic Acid, Venous: 3 mmol/L (ref 0.5–1.9)
Lactic Acid, Venous: 1.3 mmol/L (ref 0.5–1.9)

## 2022-07-13 LAB — BRAIN NATRIURETIC PEPTIDE: B Natriuretic Peptide: 38.1 pg/mL (ref 0.0–100.0)

## 2022-07-13 MED ORDER — ACETAMINOPHEN 650 MG RE SUPP
650.0000 mg | RECTAL | Status: DC | PRN
  Administered 2022-07-13: 650 mg via RECTAL
  Filled 2022-07-13: qty 1

## 2022-07-13 MED ORDER — ASPIRIN 81 MG PO CHEW
81.0000 mg | CHEWABLE_TABLET | Freq: Every day | ORAL | Status: DC
  Administered 2022-07-14 – 2022-07-16 (×3): 81 mg via ORAL
  Filled 2022-07-13 (×3): qty 1

## 2022-07-13 MED ORDER — ENOXAPARIN SODIUM 40 MG/0.4ML IJ SOSY
40.0000 mg | PREFILLED_SYRINGE | INTRAMUSCULAR | Status: DC
  Administered 2022-07-14 – 2022-07-15 (×2): 40 mg via SUBCUTANEOUS
  Filled 2022-07-13 (×2): qty 0.4

## 2022-07-13 MED ORDER — SODIUM CHLORIDE 0.9 % IV BOLUS
1000.0000 mL | Freq: Once | INTRAVENOUS | Status: AC
Start: 2022-07-13 — End: 2022-07-13
  Administered 2022-07-13: 1000 mL via INTRAVENOUS

## 2022-07-13 MED ORDER — ROSUVASTATIN CALCIUM 5 MG PO TABS
5.0000 mg | ORAL_TABLET | Freq: Every day | ORAL | Status: DC
  Administered 2022-07-14: 5 mg via ORAL
  Filled 2022-07-13: qty 1

## 2022-07-13 MED ORDER — SODIUM CHLORIDE 0.9 % IV SOLN
500.0000 mg | Freq: Once | INTRAVENOUS | Status: AC
  Administered 2022-07-13: 500 mg via INTRAVENOUS
  Filled 2022-07-13: qty 5

## 2022-07-13 MED ORDER — MONTELUKAST SODIUM 10 MG PO TABS
10.0000 mg | ORAL_TABLET | Freq: Every day | ORAL | Status: DC
  Administered 2022-07-14 – 2022-07-16 (×3): 10 mg via ORAL
  Filled 2022-07-13 (×3): qty 1

## 2022-07-13 MED ORDER — TIMOLOL MALEATE 0.5 % OP SOLN
1.0000 [drp] | Freq: Two times a day (BID) | OPHTHALMIC | Status: DC
  Administered 2022-07-14 – 2022-07-16 (×6): 1 [drp] via OPHTHALMIC
  Filled 2022-07-13: qty 5

## 2022-07-13 MED ORDER — DEXAMETHASONE SODIUM PHOSPHATE 10 MG/ML IJ SOLN
10.0000 mg | Freq: Once | INTRAMUSCULAR | Status: AC
  Administered 2022-07-13: 10 mg via INTRAVENOUS
  Filled 2022-07-13: qty 1

## 2022-07-13 MED ORDER — SODIUM CHLORIDE 0.9 % IV SOLN
1.0000 g | Freq: Once | INTRAVENOUS | Status: AC
  Administered 2022-07-13: 1 g via INTRAVENOUS
  Filled 2022-07-13: qty 10

## 2022-07-13 MED ORDER — ACETAMINOPHEN 325 MG PO TABS: 650.0000 mg | ORAL_TABLET | Freq: Once | ORAL | Status: DC

## 2022-07-13 MED ORDER — IPRATROPIUM-ALBUTEROL 0.5-2.5 (3) MG/3ML IN SOLN
3.0000 mL | Freq: Once | RESPIRATORY_TRACT | Status: AC
  Administered 2022-07-13: 3 mL via RESPIRATORY_TRACT
  Filled 2022-07-13: qty 3

## 2022-07-13 MED ORDER — PANTOPRAZOLE SODIUM 40 MG PO TBEC
40.0000 mg | DELAYED_RELEASE_TABLET | Freq: Every day | ORAL | Status: DC
  Administered 2022-07-14 – 2022-07-16 (×3): 40 mg via ORAL
  Filled 2022-07-13 (×3): qty 1

## 2022-07-13 MED ORDER — INSULIN ASPART 100 UNIT/ML IJ SOLN
0.0000 [IU] | Freq: Three times a day (TID) | INTRAMUSCULAR | Status: DC
  Administered 2022-07-14: 5 [IU] via SUBCUTANEOUS
  Administered 2022-07-14 – 2022-07-15 (×3): 2 [IU] via SUBCUTANEOUS
  Administered 2022-07-15 (×2): 1 [IU] via SUBCUTANEOUS
  Administered 2022-07-16: 2 [IU] via SUBCUTANEOUS
  Administered 2022-07-16: 1 [IU] via SUBCUTANEOUS

## 2022-07-13 MED ORDER — ALBUTEROL SULFATE HFA 108 (90 BASE) MCG/ACT IN AERS: 2.0000 | INHALATION_SPRAY | Freq: Four times a day (QID) | RESPIRATORY_TRACT | Status: DC | PRN

## 2022-07-13 MED ORDER — LATANOPROST 0.005 % OP SOLN
1.0000 [drp] | Freq: Every day | OPHTHALMIC | Status: DC
  Administered 2022-07-14 – 2022-07-15 (×2): 1 [drp] via OPHTHALMIC
  Filled 2022-07-13 (×2): qty 2.5

## 2022-07-13 MED ORDER — IOHEXOL 350 MG/ML SOLN
75.0000 mL | Freq: Once | INTRAVENOUS | Status: AC | PRN
  Administered 2022-07-13: 75 mL via INTRAVENOUS

## 2022-07-13 NOTE — ED Notes (Signed)
Physicians at bedside.

## 2022-07-13 NOTE — ED Provider Notes (Signed)
[place] EMERGENCY DEPARTMENT AT [facility] Provider Note   CSN: [id number] Arrival date & time: 07/13/22  1929     History  Chief Complaint  Patient presents with   Cough    Pt arrived via ems from home has productive 1 week cough hx cerbral palsy EMS vitals 120/70 75099%ra CBG 118    Thomas Ramos is a 52 y.o. male history of cerebral palsy, diabetes, hypertension here presenting with fever and cough.  Patient has been having productive cough for the last week or so.  Patient also has some chest pain when he coughs.  Patient came by EMS and vitals were stable and CBG was normal.  Patient has cerebral palsy and does not walk at baseline.  He does use a power wheelchair.  The history is provided by the patient.       Home Medications Prior to Admission medications   Medication Sig Start Date End Date Taking? Authorizing Provider  Albuterol Sulfate (PROAIR RESPICLICK) 108 (90 Base) MCG/ACT AEPB Inhale 2 puffs into the lungs every 6 (six) hours as needed (Shortness of breath). 05/03/20   [provider]  amLODipine (NORVASC) 10 MG tablet Take by mouth. 11/13/21   [provider]  aspirin 81 MG chewable tablet Chew 81 mg by mouth daily.    [provider]  baclofen (LIORESAL) 10 MG tablet Take 2.5 mg by mouth at bedtime. 06/01/18   [provider]  benzonatate (TESSALON) 100 MG capsule Take 1 capsule (100 mg total) by mouth 3 (three) times daily as needed for cough. 11/08/21   Skeet Simmer, MD  [facility] CLOTRIMAZOLE 1 % cream Apply 1 application topically 2 (two) times daily as needed (rash). BETWEEN TOES 08/19/17   [provider]  dextromethorphan 15 MG/5ML syrup Take 10 mLs (30 mg total) by mouth 4 (four) times daily as needed for cough. Patient not taking: Reported on 04/01/2022 06/20/21   Glenford Bayley, NP  diclofenac Sodium (VOLTAREN) 1 % GEL Apply 1 application. topically daily as needed for pain. 06/02/21   [provider]  Dulaglutide 0.75 MG/0.5ML SOPN Inject 0.75 mg into the skin once a week. Tuesdays 02/26/20   [provider]  empagliflozin (JARDIANCE) 25 MG TABS tablet 25 mg in the morning. Patient not taking: Reported on 04/01/2022 10/23/20   [provider]  famotidine (PEPCID) 20 MG tablet Take 1 tablet (20 mg total) by mouth daily. 06/20/21   Glenford Bayley, NP  fluticasone (FLONASE) 50 MCG/ACT nasal spray Place 2 sprays into both nostrils daily. 06/20/21   Glenford Bayley, NP  furosemide (LASIX) 20 MG tablet Take 20 mg by mouth daily. Patient not taking: Reported on 04/01/2022 10/23/20   [provider]  latanoprost (XALATAN) 0.005 % ophthalmic solution Place 1 drop into both eyes nightly. 08/25/21   [provider]  lidocaine (LIDODERM) 5 % Place 1 patch onto the skin daily. Remove & Discard patch within 12 hours or as directed by MD Patient not taking: Reported on 04/01/2022 11/08/21   Skeet Simmer, MD  metoprolol succinate (TOPROL-XL) 25 MG 24 hr tablet Take 25 mg by mouth daily. 02/13/21   [provider]  montelukast (SINGULAIR) 10 MG tablet Take 1 tablet by mouth daily. 07/28/21   [provider]  naproxen sodium (ALEVE) 220 MG tablet Take 220-440 mg by mouth 2 (two) times daily as needed (for pain).    [provider]  nitroGLYCERIN (NITROSTAT) 0.4 MG SL tablet  Place 0.4 mg under the tongue every 5 (five) minutes as needed for chest pain.     [provider]  omeprazole (PRILOSEC) 20 MG capsule Take 1 capsule by mouth every morning. 12/12/19   [provider]  rosuvastatin (CRESTOR) 5 MG tablet Take 5 mg by mouth at bedtime.    [provider]  timolol (TIMOPTIC) 0.5 % ophthalmic solution Place 1 drop into both eyes 2 (two) times daily. 04/10/20   [provider]  valsartan (DIOVAN) 80 MG tablet Take 80 mg by mouth daily.    [provider]      Allergies    Penicillins,  Sulfamethoxazole-trimethoprim, and Metronidazole    Review of Systems   Review of Systems  Constitutional:  Positive for fever.  Respiratory:  Positive for cough.   All other systems reviewed and are negative.   Physical Exam Updated Vital Signs BP 122/66 (BP Location: Right Arm)   Pulse 88   Temp (!) 100.7 F (38.2 C) (Axillary)   Resp 20   Ht 5\' 6"  (1.676 m)   Wt 81.6 kg   SpO2 100%   BMI 29.05 kg/m  Physical Exam Vitals and nursing note reviewed.  Constitutional:      Comments: Chronically ill and contracted  HENT:     Head: Normocephalic.     Nose: Nose normal.     Mouth/Throat:     Mouth: Mucous membranes are dry.  Eyes:     Extraocular Movements: Extraocular movements intact.     Pupils: Pupils are equal, round, and reactive to light.  Cardiovascular:     Rate and Rhythm: Regular rhythm. Tachycardia present.  Pulmonary:     Comments: Tachypneic and rhonchorous bilaterally.  Crackles bilaterally Abdominal:     General: Abdomen is flat.     Palpations: Abdomen is soft.  Musculoskeletal:        General: Normal range of motion.     Cervical back: Normal range of motion and neck supple.     Comments: Contracted  Skin:    General: Skin is warm.     Capillary Refill: Capillary refill takes less than 2 seconds.  Neurological:     Comments: Patient is contracted at baseline  Psychiatric:        Mood and Affect: Mood normal.        Behavior: Behavior normal.     ED Results / Procedures / Treatments   Labs (all labs ordered are listed, but only abnormal results are displayed) Labs Reviewed  CULTURE, BLOOD (ROUTINE X 2)  CULTURE, BLOOD (ROUTINE X 2)  RESP PANEL BY RT-PCR (RSV, FLU A&B, COVID)  [id number]  CBC WITH DIFFERENTIAL/PLATELET  COMPREHENSIVE METABOLIC PANEL  LACTIC ACID, PLASMA  LACTIC ACID, PLASMA  BRAIN NATRIURETIC PEPTIDE  TROPONIN I (HIGH SENSITIVITY)    EKG None  Radiology No results found.  Procedures Procedures    Medications  Ordered in ED Medications  ipratropium-albuterol (DUONEB) 0.5-2.5 (3) MG/3ML nebulizer solution 3 mL (has no administration in time range)  acetaminophen (TYLENOL) tablet 650 mg (has no administration in time range)    ED Course/ Medical Decision Making/ A&P                             Medical Decision Making Thomas Ramos is a 52 y.o. male here presenting with cough and fever.  Concern for pneumonia versus bronchitis versus COVID versus flu.  Plan to get CBC CMP  and lactate and cultures and chest x-ray and COVID and flu test.  10:19 PM WBC is 6.8.  Lactate is elevated at 3.  Patient is febrile 103 initially.  Chest x-ray showed no pneumonia but clinically concern for possible pneumonia so I ordered Rocephin and azithromycin.  Initially ordered CT chest but patient turned out to be COVID-positive.  Patient is still very tachypneic.  I ordered Decadron.  At this point hospitalist to admit  Problems Addressed: COVID-19: acute illness or injury  Amount and/or Complexity of Data Reviewed Labs: ordered. Decision-making details documented in ED Course. Radiology: ordered and independent interpretation performed. Decision-making details documented in ED Course. ECG/medicine tests: ordered and independent interpretation performed. Decision-making details documented in ED Course.  Risk OTC drugs. Prescription drug management. Decision regarding hospitalization.    Final Clinical Impression(s) / ED Diagnoses Final diagnoses:  None    Rx / DC Orders ED Discharge Orders     None         Charlynne Pander, MD 07/13/22 2234

## 2022-07-13 NOTE — H&P (Incomplete)
Date: 07/13/2022               Patient Name:  Thomas Ramos MRN: [id number]  DOB: [birth year] Age / Sex: 52 y.o., male   PCP: Luc[facility] Maine         Medical Service: Internal Medicine Teaching Service         Attending Physician: Dr. Oswaldo Done, Marquita Palms, *      First Contact: Dr. Karoline Caldwell, MD Pager [phone]    Second Contact: Dr. 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  He was feeling himself up until last night whenever he started to have a nonproductive cough with associated dyspnea and sharp chest pain when he coughed.  He did have some similar chest pain last week and saw his primary care provider about this who told him to take some Tylenol and go to the emergency department if this did not go away.  Did not recur until he started having this cough.  He denies any known sick contacts but does frequently go to the store.  Denies any sore throat, nausea, vomiting, diarrhea, abdominal pain, dysuria, presyncope, or syncope.  ED Course: Presented febrile, tachypneic, mildly tachycardic, and borderline low pressures but satting well on room air.  Labs showed elevated lactic acid at 3.0 and creatinine of 1.1 without leukocytosis, troponin elevation, BNP elevation, or other electrolyte abnormalities.  Found to be COVID-positive without focal chest x-ray abnormality.  IMTS paged for admission.  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 BIOPSY  06/24/2021   Procedure: BIOPSY;  Surgeon: Sherrilyn Rist, MD;  Location: [facility];  Service: Gastroenterology;;  . Haynes Hoehn INJECTION N/A 12/21/2012   Procedure: BOTOX INJECTION;  Surgeon: Louis Meckel, MD;  Location: [facility] ENDOSCOPY;  Service: Endoscopy;  Laterality: N/A;  . BOTOX INJECTION N/A 06/28/2014   Procedure: BOTOX INJECTION;  Surgeon: Louis Meckel, MD;  Location: [facility] ENDOSCOPY;  Service: Endoscopy;  Laterality: N/A;  . COLONOSCOPY WITH PROPOFOL N/A 06/24/2021   Procedure: COLONOSCOPY WITH PROPOFOL;  Surgeon: Sherrilyn Rist, MD;  Location: [facility] ENDOSCOPY;  Service: Gastroenterology;  Laterality: N/A;  . ESOPHAGOGASTRODUODENOSCOPY N/A 12/21/2012   Procedure: ESOPHAGOGASTRODUODENOSCOPY (EGD);  Surgeon: Louis Meckel, MD;  Location: [facility];  Service: Endoscopy;  Laterality: N/A;  . ESOPHAGOGASTRODUODENOSCOPY N/A 06/28/2014   Procedure: ESOPHAGOGASTRODUODENOSCOPY (EGD);  Surgeon: Louis Meckel, MD;  Location: [facility] ENDOSCOPY;  Service: Endoscopy;  Laterality: N/A;  . ESOPHAGOGASTRODUODENOSCOPY N/A 06/24/2021   Procedure: ESOPHAGOGASTRODUODENOSCOPY (EGD);  Surgeon: Sherrilyn Rist, MD;  Location: [facility];  Service: Gastroenterology;  Laterality: N/A;  . ESOPHAGOGASTRODUODENOSCOPY (EGD) WITH PROPOFOL  N/A 09/03/2017   Procedure: ESOPHAGOGASTRODUODENOSCOPY (EGD) WITH PROPOFOL;  Surgeon: Sherrilyn Rist, MD;  Location: [facility] ENDOSCOPY;  Service: Gastroenterology;  Laterality: N/A;  . ESOPHAGUS SURGERY     stretched esophagus  . legs    . MOUTH SURGERY    . POLYPECTOMY  06/24/2021   Procedure: POLYPECTOMY;  Surgeon: Sherrilyn Rist, MD;   Location: [facility];  Service: Gastroenterology;;  . TENDON RELEASE      Social:  Lives alone in [place], has caretaker who lives next-door. Level of Function: Dependent in ADLs and IADLs.  Ambulates with a powered wheelchair PCP: Teena Irani, PA-C Substances: Denies current or prior tobacco or drug use.  Will drink 1-2 beers per weekend.  Family History:  Family History  Problem Relation Age of Onset  . Hypertension Father   . Lung cancer Father   . Diabetes Mother      Allergies: Allergies as of 07/13/2022 - Review Complete 07/13/2022  Allergen Reaction Noted  . Penicillins Hives, Nausea And Vomiting, and Other (See Comments) 09/16/2010  . Sulfamethoxazole-trimethoprim Nausea And Vomiting 10/17/2015  . Metronidazole Nausea And Vomiting 06/13/2014    Review of Systems: A complete ROS was negative except as per HPI.   OBJECTIVE:   Physical Exam: Blood pressure 100/75, pulse 98, temperature (!) 103.4 F (39.7 C), temperature source Rectal, resp. rate 17, height 5\' 6"  (1.676 m), weight 81.6 kg, SpO2 94 %.  Constitutional: Middle-age male with thin and contracted extremities laying in bed. In no acute distress. HENT: Normocephalic, atraumatic,  Eyes: Sclera non-icteric, PERRL, EOM intact Cardio:Regular rate and rhythm. No murmurs, rubs, or gallops. 2+ bilateral radial pulses.  Warm and well-perfused extremities. Pulm:Clear to auscultation bilaterally. Normal work of breathing on room air. Abdomen: Soft, non-tender, non-distended, positive bowel sounds. MSK: Trace lower extremity edema bilaterally without muscular tenderness. Skin:Warm and dry. Neuro:Alert and oriented x3.  Chronic deficits from cerebral palsy, immobile legs, left greater than right upper extremity movement, head and neck slightly tilted to the right. Psych:Pleasant mood and affect.  Labs: CBC    Component Value Date/Time   WBC 6.8 07/13/2022 1953   RBC 5.35 07/13/2022 1953   HGB 14.7  07/13/2022 1953   HCT 44.5 07/13/2022 1953   PLT 217 07/13/2022 1953   MCV 83.2 07/13/2022 1953   MCH 27.5 07/13/2022 1953   MCHC 33.0 07/13/2022 1953   RDW 12.1 07/13/2022 1953   LYMPHSABS 1.0 07/13/2022 1953   MONOABS 0.5 07/13/2022 1953   EOSABS 0.1 07/13/2022 1953   BASOSABS 0.0 07/13/2022 1953     CMP     Component Value Date/Time   NA 136 07/13/2022 1953   K 4.0 07/13/2022 1953   CL 99 07/13/2022 1953   CO2 25 07/13/2022 1953   GLUCOSE 138 (H) 07/13/2022 1953   BUN 15 07/13/2022 1953   CREATININE 1.10 07/13/2022 1953   CREATININE 0.65 03/19/2016 1715   CALCIUM 9.3 07/13/2022 1953   PROT 7.0 07/13/2022 1953   ALBUMIN 4.1 07/13/2022 1953   AST 16 07/13/2022 1953   ALT 13 07/13/2022 1953   ALKPHOS 109 07/13/2022 1953   BILITOT 0.8 07/13/2022 1953   GFRNONAA >60 07/13/2022 1953   GFRNONAA >89 03/19/2016 1715   GFRAA >60 03/27/2019 2125   GFRAA >89 03/19/2016 1715    Imaging: DG Chest Port 1 View Result Date: 07/13/2022 IMPRESSION: Low lung volumes. No acute cardiopulmonary disease.  Electronically Signed   By: Minerva Fester M.D.   On:  07/13/2022 19:57     EKG: personally reviewed my interpretation is sinus tachycardia. Consistent with prior EKG.  ASSESSMENT & PLAN:   Assessment & Plan by Problem: Principal Problem:   COVID-19 Active Problems:   Chronic diastolic (congestive) heart failure (HCC)   OSA (obstructive sleep apnea)   Essential hypertension   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HCC)   Type 2 diabetes mellitus with complication, without long-term current use of insulin (HCC)   Thomas Ramos is a 52 y.o. 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  COVID-19 Presenting with respiratory disease secondary to COVID-19 without focal findings on exam or chest x-ray/CT.  With his  pleuritic chest pain, tachypnea, and tachycardia there was some concern for pulmonary embolism but CTA of the chest in the ED was negative.  He has been vaccinated 3 times for COVID  AKI Creatinine here 1.10 with baseline appearing to be around 0.7-0.9.  No noted decrease or increase in urine output or signs or symptoms of UTI other than fever.  Having adequate oral intake.  We will monitor renal function but this may just be progression or normal fluctuation of his renal function. - Repeat BMP in the morning  Hypertension Due to his borderline low blood pressures we will hold his antihypertensives tonight and resume as tolerated. - Hold antihypertensives  T2DM A1c in 03/2022 was 6.3.  Appears to be on Trulicity 0.75 mg weekly and had previously been on metformin and Jardiance but does not have recent fills of these medications.  No significant hyperglycemia on admission. - Sensitive SSI  Obstructive sleep apnea Patient does not use a CPAP at home and appears to last seen his pulmonologist over a year ago.  Will have CPAP here if he would like to try it.  HFpEF Last available echo from 07/2020 shows LVEF of 70 to 75% with hyperdynamic LV, mild LVH, and G1DD.  Follows with Dr. Jacques Navy and was last seen in 12/2021 at which time his metoprolol was discontinued due to fatigue.  However per pharmacy fills he has been getting his metoprolol.  This will need to be clarified.  As above we will hold his antihypertensives and furosemide in the setting of borderline low blood pressures and AKI.  Diet: {NAMES:3044014::"Normal","Heart Healthy","Carb-Modified","Renal","Carb/Renal","NPO","TPN","Tube Feeds"} VTE: {NAMES:3044014::"Heparin","Enoxaparin","SCDs","DOAC","None"} IVF: {NAMES:3044014::"None","NS","1/2 NS","LR","D5","D10"},{NAMES:3044014::"None","10cc/hr","25cc/hr","50cc/hr","75cc/hr","100cc/hr","110cc/hr","125cc/hr","Bolus"} Code: {NAMES:3044014::"Full","DNR","DNI","DNR/DNI","Comfort  Care","Unknown"}  Dispo: Admit patient to {STATUS:3044014::"Observation with expected length of stay less than 2 midnights.","Inpatient with expected length of stay greater than 2 midnights."}  Signed: Rocky Morel, DO Internal Medicine Resident PGY-1  07/13/2022, 11:51 PM   {Intern/Pager:[phone]}

## 2022-07-13 NOTE — Hospital Course (Signed)
Today called the doctor because he didn't feel well  Having chest pain, non-productive cough, dyspnea. No sore throat, diarrhea, abdominal pain.  Chest pain while coughs (sharp), did have some last week.   Lives on home. Caregiver lives right beside him. Can feed himself, get around in wheelchair. Needs help getting in and out of wheelchair and bathing. Can get out to store (goes to [employer] every day using bus)  Goes to [employer], pulmonologist, GI  Uses urinal, no changes in urine, dysuria, hematuria   Some smoking years ago; occasionally alcohol use (weekends), typically 2 beers   Father with htn. Brother with mi. No cancers  4/30 Cough is improved Appetite is good No SOB but still has CP when he coughs

## 2022-07-13 NOTE — H&P (Signed)
Date: 07/13/2022               Patient Name:  Thomas Ramos MRN: [id number]  DOB: [birth year] Age / Sex: 52 y.o., male   PCP: Lucila Maine         Medical Service: Internal Medicine Teaching Service         Attending Physician: Dr. Oswaldo Done, Marquita Palms, *      First Contact: Dr. Karoline Caldwell, MD Pager [phone]    Second Contact: Dr. 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  He was feeling himself up until last night whenever he started to have a nonproductive cough with associated dyspnea and sharp chest pain when he coughed.  He did have some similar chest pain last week and saw his primary care provider about this who told him to take some Tylenol and go to the emergency department if this did not go away.  Did not recur until he started having this cough.  He denies any known sick contacts but does frequently go to the store.  Denies any sore throat, nausea, vomiting, diarrhea, abdominal pain, dysuria, presyncope, or syncope.  ED Course: Presented febrile, tachypneic, mildly tachycardic, and borderline low pressures but satting well on room air.  Labs showed elevated lactic acid at 3.0 and creatinine of 1.1 without leukocytosis, troponin elevation, BNP elevation, or other electrolyte abnormalities.  Found to be COVID-positive without focal chest x-ray abnormality.  IM[facility] paged for admission.  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 GERD Chronic dysphagia with torturous esophagus  Meds:  Amlodipine 10 mg daily Aspirin 81 mg daily Baclofen 10 mg  nightly Dulaglutide 0.75 mg weekly Furosemide 20 mg daily Latanoprost eyedrops nightly Metoprolol succinate 25 mg daily Montelukast 10 mg daily Omeprazole 20 mg daily Rosuvastatin 5 mg daily Timolol eyedrops twice daily Valsartan 80 mg daily  Past Surgical History Past Surgical History:  Procedure Laterality Date   BIOPSY  06/24/2021   Procedure: BIOPSY;  Surgeon: Sherrilyn Rist, MD;  Location: [facility];  Service: Gastroenterology;;   BOTOX INJECTION N/A 12/21/2012   Procedure: BOTOX INJECTION;  Surgeon: Louis Meckel, MD;  Location: [facility];  Service: Endoscopy;  Laterality: N/A;   BOTOX INJECTION N/A 06/28/2014   Procedure: BOTOX INJECTION;  Surgeon: Louis Meckel, MD;  Location: [facility] ENDOSCOPY;  Service: Endoscopy;  Laterality: N/A;   COLONOSCOPY WITH PROPOFOL N/A 06/24/2021   Procedure: COLONOSCOPY WITH PROPOFOL;  Surgeon: Sherrilyn Rist, MD;  Location: [facility] ENDOSCOPY;  Service: Gastroenterology;  Laterality: N/A;   ESOPHAGOGASTRODUODENOSCOPY N/A 12/21/2012   Procedure: ESOPHAGOGASTRODUODENOSCOPY (EGD);  Surgeon: Louis Meckel, MD;  Location: [facility];  Service: Endoscopy;  Laterality: N/A;   ESOPHAGOGASTRODUODENOSCOPY N/A 06/28/2014   Procedure: ESOPHAGOGASTRODUODENOSCOPY (EGD);  Surgeon: Louis Meckel, MD;  Location[facility]COPY;  Service: Endoscopy;  Laterality: N/A;   ESOPHAGOGASTRODUODENOSCOPY N/A 06/24/2021   Procedure: ESOPHAGOGASTRODUODENOSCOPY (EGD);  Surgeon: Sherrilyn Rist, MD;  Location: [facility];  Service: Gastroenterology;  Laterality: N/A;   ESOPHAGOGASTRODUODENOSCOPY (EGD) WITH PROPOFOL  N/A 09/03/2017   Procedure: ESOPHAGOGASTRODUODENOSCOPY (EGD) WITH PROPOFOL;  Surgeon: Sherrilyn Rist, MD;  Location: [facility] ENDOS[facility];  Service: Gastroenterology;  Laterality: N/A;   ESOPHAGUS SURGERY     stretched esophagus   legs     MOUTH SURGERY     POLYPECTOMY  06/24/2021   Procedure: POLYPECTOMY;  Surgeon: Sherrilyn Rist, MD;  Location: [facility]  ENDOSCOPY;  Service: Gastroenterology;;   TENDON RELEASE      Social:  Lives alone in [place], has caretaker who lives next-door. Level of Function: Dependent in ADLs and IADLs.  Ambulates with a powered wheelchair PCP: Teena Irani, PA-C Substances: Denies current or prior tobacco or drug use.  Will drink 1-2 beers per weekend.  Family History:  Family History  Problem Relation Age of Onset   Hypertension Father    Lung cancer Father    Diabetes Mother      Allergies: Allergies as of 07/13/2022 - Review Complete 07/13/2022  Allergen Reaction Noted   Penicillins Hives, Nausea And Vomiting, and Other (See Comments) 09/16/2010   Sulfamethoxazole-trimethoprim Nausea And Vomiting 10/17/2015   Metronidazole Nausea And Vomiting 06/13/2014    Review of Systems: A complete ROS was negative except as per HPI.   OBJECTIVE:   Physical Exam: Blood pressure 100/75, pulse 98, temperature (!) 103.4 F (39.7 C), temperature source Rectal, resp. rate 17, height 5\' 6"  (1.676 m), weight 81.6 kg, SpO2 94 %.  Constitutional: Middle-age male with thin and contracted extremities laying in bed. In no acute distress. HENT: Normocephalic, atraumatic,  Eyes: Sclera non-icteric, PERRL, EOM intact Cardio:Regular rate and rhythm. No murmurs, rubs, or gallops. 2+ bilateral radial pulses.  Warm and well-perfused extremities. Pulm:Clear to auscultation bilaterally. Normal work of breathing on room air. Abdomen: Soft, non-tender, non-distended, positive bowel sounds. MSK: Trace lower extremity edema bilaterally without muscular tenderness. Skin:Warm and dry. Neuro:Alert and oriented x3.  Chronic deficits from cerebral palsy, immobile legs, left greater than right upper extremity movement, head and neck slightly tilted to the right. Psych:Pleasant mood and affect.  Labs: CBC    Component Value Date/Time   WBC 6.8 07/13/2022 1953   RBC 5.35 07/13/2022 1953   HGB 14.7 07/13/2022 1953   HCT 44.5  07/13/2022 1953   PLT 217 07/13/2022 1953   MCV 83.2 07/13/2022 1953   MCH 27.5 07/13/2022 1953   MCHC 33.0 07/13/2022 1953   RDW 12.1 07/13/2022 1953   LYMPHSABS 1.0 07/13/2022 1953   MONOABS 0.5 07/13/2022 1953   EOSABS 0.1 07/13/2022 1953   BASOSABS 0.0 07/13/2022 1953     CMP     Component Value Date/Time   NA 136 07/13/2022 1953   K 4.0 07/13/2022 1953   CL 99 07/13/2022 1953   CO2 25 07/13/2022 1953   GLUCOSE 138 (H) 07/13/2022 1953   BUN 15 07/13/2022 1953   CREATININE 1.10 07/13/2022 1953   CREATININE 0.65 03/19/2016 1715   CALCIUM 9.3 07/13/2022 1953   PROT 7.0 07/13/2022 1953   ALBUMIN 4.1 07/13/2022 1953   AST 16 07/13/2022 1953   ALT 13 07/13/2022 1953   ALKPHOS 109 07/13/2022 1953   BILITOT 0.8 07/13/2022 1953   GFRNONAA >60 07/13/2022 1953   GFRNONAA >89 03/19/2016 1715   GFRAA >60 03/27/2019 2125   GFRAA >89 03/19/2016 1715    Imaging: DG Chest Port 1 View Result Date: 07/13/2022 IMPRESSION: Low lung volumes. No acute cardiopulmonary disease.  Electronically Signed   By: Minerva Fester M.D.   On:  07/13/2022 19:57     EKG: personally reviewed my interpretation is sinus tachycardia. Consistent with prior EKG.  ASSESSMENT & PLAN:   Assessment & Plan by Problem: Principal Problem:   COVID-19 Active Problems:   Chronic diastolic (congestive) heart failure (HCC)   OSA (obstructive sleep apnea)   Essential hypertension   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HCC)   Type 2 diabetes mellitus with complication, without long-term current use of insulin (HCC)   MARTHA SOLTYS is a 52 y.o. 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  COVID-19 Presenting with respiratory disease secondary to COVID-19 without focal findings on exam or chest x-ray/CT.  With his pleuritic chest pain,  tachypnea, and tachycardia there was some concern for pulmonary embolism but CTA of the chest in the ED was negative.  He has been vaccinated 3 or 4 times for COVID with his last vaccine in 01/2021.  He was given a dose of dexamethasone IV in the ED along with azithromycin and ceftriaxone.  Since he is not having any hypoxemia or supplemental oxygen requirements we will hold off on remdesivir and not give any further antibiotics or steroids at this time.  Will support with antipyretics and breathing treatments as needed. - Monitor CBC and fever curve - Tylenol 650 mg q6h prn - Albuterol q6h prn - Page on call for supplemental oxygen needs  AKI Creatinine here 1.10 with baseline appearing to be around 0.7-0.9.  No noted decrease or increase in urine output or signs or symptoms of UTI other than fever.  Having adequate oral intake.  We will monitor renal function but this may just be progression or normal fluctuation of his renal function. - Repeat BMP in the morning  Hypertension Due to his borderline low blood pressures we will hold his antihypertensives tonight and resume as tolerated. - Hold antihypertensives  T2DM A1c in 03/2022 was 6.3.  Appears to be on Trulicity 0.75 mg weekly and had previously been on metformin and Jardiance but does not have recent fills of these medications.  No significant hyperglycemia on admission. - Sensitive SSI  Obstructive sleep apnea Patient does not use a CPAP at home and appears to last seen his pulmonologist over a year ago.  Will have CPAP here if he would like to try it.  HFpEF Last available echo from 07/2020 shows LVEF of 70 to 75% with hyperdynamic LV, mild LVH, and G1DD.  Follows with Dr. Jacques Navy and was last seen in 12/2021 at which time his metoprolol was discontinued due to fatigue.  However per pharmacy fills he has been getting his metoprolol.  This will need to be clarified.  As above we will hold his antihypertensives and furosemide in the  setting of borderline low blood pressures and AKI.  Diet: Carb-Modified VTE: Enoxaparin IVF: None Code: Full  Dispo: Admit patient to Observation with expected length of stay less than 2 midnights.  Signed: Rocky Morel, DO Internal Medicine Resident PGY-1  07/13/2022, 11:51 PM   Dr. Karoline Caldwell, MD Pager [phone]

## 2022-07-14 DIAGNOSIS — G809 Cerebral palsy, unspecified: Secondary | ICD-10-CM | POA: Diagnosis present

## 2022-07-14 DIAGNOSIS — Z7985 Long-term (current) use of injectable non-insulin antidiabetic drugs: Secondary | ICD-10-CM | POA: Diagnosis not present

## 2022-07-14 DIAGNOSIS — Z7982 Long term (current) use of aspirin: Secondary | ICD-10-CM | POA: Diagnosis not present

## 2022-07-14 DIAGNOSIS — Z8249 Family history of ischemic heart disease and other diseases of the circulatory system: Secondary | ICD-10-CM | POA: Diagnosis not present

## 2022-07-14 DIAGNOSIS — Z880 Allergy status to penicillin: Secondary | ICD-10-CM | POA: Diagnosis not present

## 2022-07-14 DIAGNOSIS — Z79899 Other long term (current) drug therapy: Secondary | ICD-10-CM | POA: Diagnosis not present

## 2022-07-14 DIAGNOSIS — Z801 Family history of malignant neoplasm of trachea, bronchus and lung: Secondary | ICD-10-CM | POA: Diagnosis not present

## 2022-07-14 DIAGNOSIS — Z883 Allergy status to other anti-infective agents status: Secondary | ICD-10-CM | POA: Diagnosis not present

## 2022-07-14 DIAGNOSIS — E119 Type 2 diabetes mellitus without complications: Secondary | ICD-10-CM | POA: Diagnosis present

## 2022-07-14 DIAGNOSIS — Z833 Family history of diabetes mellitus: Secondary | ICD-10-CM | POA: Diagnosis not present

## 2022-07-14 DIAGNOSIS — Z7984 Long term (current) use of oral hypoglycemic drugs: Secondary | ICD-10-CM | POA: Diagnosis not present

## 2022-07-14 DIAGNOSIS — K219 Gastro-esophageal reflux disease without esophagitis: Secondary | ICD-10-CM | POA: Diagnosis present

## 2022-07-14 DIAGNOSIS — I110 Hypertensive heart disease with heart failure: Secondary | ICD-10-CM | POA: Diagnosis present

## 2022-07-14 DIAGNOSIS — Z882 Allergy status to sulfonamides status: Secondary | ICD-10-CM | POA: Diagnosis not present

## 2022-07-14 DIAGNOSIS — I5032 Chronic diastolic (congestive) heart failure: Secondary | ICD-10-CM | POA: Diagnosis present

## 2022-07-14 DIAGNOSIS — G4733 Obstructive sleep apnea (adult) (pediatric): Secondary | ICD-10-CM | POA: Diagnosis present

## 2022-07-14 DIAGNOSIS — U071 COVID-19: Secondary | ICD-10-CM | POA: Diagnosis present

## 2022-07-14 LAB — CBC
HCT: 34.8 % — ABNORMAL LOW (ref 39.0–52.0)
Hemoglobin: 12.1 g/dL — ABNORMAL LOW (ref 13.0–17.0)
MCH: 28.6 pg (ref 26.0–34.0)
MCHC: 34.8 g/dL (ref 30.0–36.0)
MCV: 82.3 fL (ref 80.0–100.0)
Platelets: 152 10*3/uL (ref 150–400)
RBC: 4.23 MIL/uL (ref 4.22–5.81)
RDW: 12.2 % (ref 11.5–15.5)
WBC: 5.1 10*3/uL (ref 4.0–10.5)
nRBC: 0 % (ref 0.0–0.2)

## 2022-07-14 LAB — BASIC METABOLIC PANEL
Anion gap: 10 (ref 5–15)
BUN: 15 mg/dL (ref 6–20)
CO2: 22 mmol/L (ref 22–32)
Calcium: 8.3 mg/dL — ABNORMAL LOW (ref 8.9–10.3)
Chloride: 102 mmol/L (ref 98–111)
Creatinine, Ser: 0.96 mg/dL (ref 0.61–1.24)
GFR, Estimated: >60 mL/min (ref >60)
Glucose, Bld: 159 mg/dL — ABNORMAL HIGH (ref 70–99)
Potassium: 3.7 mmol/L (ref 3.5–5.1)
Sodium: 134 mmol/L — ABNORMAL LOW (ref 135–145)

## 2022-07-14 LAB — GLUCOSE, CAPILLARY
Glucose-Capillary: 165 mg/dL — ABNORMAL HIGH (ref 70–99)
Glucose-Capillary: 258 mg/dL — ABNORMAL HIGH (ref 70–99)
Glucose-Capillary: 237 mg/dL — ABNORMAL HIGH (ref 70–99)

## 2022-07-14 LAB — CBG MONITORING, ED: Glucose-Capillary: 184 mg/dL — ABNORMAL HIGH (ref 70–99)

## 2022-07-14 LAB — HIV ANTIBODY (ROUTINE TESTING W REFLEX): HIV Screen 4th Generation wRfx: NONREACTIVE

## 2022-07-14 MED ORDER — NIRMATRELVIR/RITONAVIR (PAXLOVID)TABLET
3.0000 | ORAL_TABLET | Freq: Two times a day (BID) | ORAL | Status: DC
  Administered 2022-07-14 – 2022-07-16 (×5): 3 via ORAL
  Filled 2022-07-14: qty 30

## 2022-07-14 NOTE — ED Notes (Signed)
ED TO INPATIENT HANDOFF REPORT  ED Nurse Name and Phone #: Vernona Rieger [phone]  S Name/Age/Gender Thomas Ramos 52 y.o. male Room/Bed: 044C/044C  Code Status   Code Status: Full Code  Home/SNF/Other Home Patient oriented to: self, place, time, and situation Is this baseline? Yes   Triage Complete: Triage complete  Chief Complaint COVID [U07.1]  Triage Note No notes on file   Allergies Allergies  Allergen Reactions   Penicillins Hives, Nausea And Vomiting and Other (See Comments)    Patient tolerated cefazolin in 2017 Has patient had a PCN reaction causing immediate rash, facial/tongue/throat swelling, SOB or lightheadedness with hypotension: Yes Has patient had a PCN reaction causing severe rash involving mucus membranes or skin necrosis: No Has patient had a PCN reaction that required hospitalization No Has patient had a PCN reaction occurring within the last 10 years: Yes If all of the above answers are "NO", then may proceed with Cephalosporin use.   Sulfamethoxazole-Trimethoprim Nausea And Vomiting   Metronidazole Nausea And Vomiting    Level of Care/Admitting Diagnosis ED Disposition     ED Disposition  Admit   Condition  --   Comment  Hospital Area: [facility] [[id number]]  Level of Care: Med-Surg [16]  May place patient in observation at [facility] or [facility] if equivalent level of care is available:: Yes  Covid Evaluation: Confirmed COVID Positive  Diagnosis: COVID [[id number]]  Admitting Physician: Tyson Alias [[id number]]  Attending Physician: Tyson Alias [[id number]]          B Medical/Surgery History Past Medical History:  Diagnosis Date   Anxiety    Asthma    Cellulitis 08/04/2017   Cerebral palsy (HCC)    Chronic diastolic (congestive) heart failure (HCC)    CP (cerebral palsy) (HCC)    Diabetes mellitus without complication (HCC)    borderline   Drug induced constipation    Environmental allergies    takes  inhalers if needed   Esophageal stricture    GERD (gastroesophageal reflux disease)    Hypertension    Motility disorder, esophageal    Pneumonia    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HCC)    S/P Botox injection    approx every 4 months   Seasonal allergies    Past Surgical History:  Procedure Laterality Date   BIOPSY  06/24/2021   Procedure: BIOPSY;  Surgeon: Sherrilyn Rist, MD;  Location: [facility];  Service: Gastroenterology;;   BOTOX INJECTION N/A 12/21/2012   Procedure: BOTOX INJECTION;  Surgeon: Louis Meckel, MD;  Location: [facility] ENDOSCOPY;  Service: Endoscopy;  Laterality: N/A;   BOTOX INJECTION N/A 06/28/2014   Procedure: BOTOX INJECTION;  Surgeon: Louis Meckel, MD;  Location: [facility] ENDOSCOPY;  Service: Endoscopy;  Laterality: N/A;   COLONOSCOPY WITH PROPOFOL N/A 06/24/2021   Procedure: COLONOSCOPY WITH PROPOFOL;  Surgeon: Sherrilyn Rist, MD;  Location: [facility] ENDOSCOPY;  Service: Gastroenterology;  Laterality: N/A;   ESOPHAGOGASTRODUODENOSCOPY N/A 12/21/2012   Procedure: ESOPHAGOGASTRODUODENOSCOPY (EGD);  Surgeon: Louis Meckel, MD;  Location: [facility];  Service: Endoscopy;  Laterality: N/A;   ESOPHAGOGASTRODUODENOSCOPY N/A 06/28/2014   Procedure: ESOPHAGOGASTRODUODENOSCOPY (EGD);  Surgeon: Louis Meckel, MD;  Location: [facility] ENDOSCOPY;  Service: Endoscopy;  Laterality: N/A;   ESOPHAGOGASTRODUODENOSCOPY N/A 06/24/2021   Procedure: ESOPHAGOGASTRODUODENOSCOPY (EGD);  Surgeon: Sherrilyn Rist, MD;  Location: [facility];  Service: Gastroenterology;  Laterality: N/A;   ESOPHAGOGASTRODUODENOSCOPY (EGD) WITH PROPOFOL N/A 09/03/2017   Procedure: ESOPHAGOGASTRODUODENOSCOPY (EGD) WITH PROPOFOL;  Surgeon: Sherrilyn Rist, MD;  Location: [facility];  Service: Gastroenterology;  Laterality: N/A;   ESOPHAGUS SURGERY     stretched esophagus   legs     MOUTH SURGERY     POLYPECTOMY  06/24/2021   Procedure: POLYPECTOMY;  Surgeon: Sherrilyn Rist, MD;  Location: [facility] ENDOSCOPY;   Service: Gastroenterology;;   TENDON RELEASE       A IV Location/Drains/Wounds Patient Lines/Drains/Airways Status     Active Line/Drains/Airways     Name Placement date Placement time Site Days   Peripheral IV 07/13/22 20 G Left;Posterior Hand 07/13/22  1951  Hand  1   Peripheral IV 07/13/22 20 G Left Antecubital 07/13/22  1950  Antecubital  1   Pressure Injury 03/21/19 Sacrum Medial Stage 1 -  Intact skin with non-blanchable redness of a localized area usually over a bony prominence. 03/21/19  2326  -- 1211            Intake/Output Last 24 hours  Intake/Output Summary (Last 24 hours) at 07/14/2022 0853 Last data filed at 07/13/2022 2301 Gross per 24 hour  Intake 1350 ml  Output --  Net 1350 ml    Labs/Imaging Results for orders placed or performed during the hospital encounter of 07/13/22 (from the past 48 hour(s))  Resp panel by RT-PCR (RSV, Flu A&B, Covid) Anterior Nasal Swab     Status: Abnormal   Collection Time: 07/13/22  7:37 PM   Specimen: Anterior Nasal Swab  Result Value Ref Range   SARS Coronavirus 2 by RT PCR POSITIVE (A) NEGATIVE   Influenza A by PCR NEGATIVE NEGATIVE   Influenza B by PCR NEGATIVE NEGATIVE    Comment: (NOTE) The Xpert Xpress SARS-CoV-2/FLU/RSV plus assay is intended as an aid in the diagnosis of influenza from Nasopharyngeal swab specimens and should not be used as a sole basis for treatment. Nasal washings and aspirates are unacceptable for Xpert Xpress SARS-CoV-2/FLU/RSV testing.  Fact Sheet for Patients: BloggerCourse.com  Fact Sheet for Healthcare Providers: SeriousBroker.it  This test is not yet approved or cleared by the [place] FDA and has been authorized for detection and/or diagnosis of SARS-CoV-2 by FDA under an Emergency Use Authorization (EUA). This EUA will remain in effect (meaning this test can be used) for the duration of the COVID-19 declaration under Section  564(b)(1) of the Act, 21 U.S.C. section 360bbb-3(b)(1), unless the authorization is terminated or revoked.     Resp Syncytial Virus by PCR NEGATIVE NEGATIVE    Comment: (NOTE) Fact Sheet for Patients: BloggerCourse.com  Fact Sheet for Healthcare Providers: SeriousBroker.it  This test is not yet approved or cleared by the [place] FDA and has been authorized for detection and/or diagnosis of SARS-CoV-2 by FDA under an Emergency Use Authorization (EUA). This EUA will remain in effect (meaning this test can be used) for the duration of the COVID-19 declaration under Section 564(b)(1) of the Act, 21 U.S.C. section 360bbb-3(b)(1), unless the authorization is terminated or revoked.  Performed at [facility], [address] [place], [place] [address]   Blood culture (routine x 2)     Status: None (Preliminary result)   Collection Time: 07/13/22  7:45 PM   Specimen: BLOOD  Result Value Ref Range   Specimen Description BLOOD LEFT ANTECUBITAL    Special Requests      BOTTLES DRAWN AEROBIC AND ANAEROBIC Blood Culture results may not be optimal due to an excessive volume of blood received in culture bottles  Culture      NO GROWTH < 12 HOURS Performed at [facility], [address], [place], [place] [address]    Report Status PENDING   CBC with Differential     Status: None   Collection Time: 07/13/22  7:53 PM  Result Value Ref Range   WBC 6.8 4.0 - 10.5 K/uL   RBC 5.35 4.22 - 5.81 MIL/uL   Hemoglobin 14.7 13.0 - 17.0 g/dL   HCT 60.4 54.0 - 98.1 %   MCV 83.2 80.0 - 100.0 fL   MCH 27.5 26.0 - 34.0 pg   MCHC 33.0 30.0 - 36.0 g/dL   RDW 19.1 47.8 - 29.5 %   Platelets 217 150 - 400 K/uL   nRBC 0.0 0.0 - 0.2 %   Neutrophils Relative % 78 %   Neutro Abs 5.2 1.7 - 7.7 K/uL   Lymphocytes Relative 14 %   Lymphs Abs 1.0 0.7 - 4.0 K/uL   Monocytes Relative 7 %   Monocytes Absolute 0.5 0.1 - 1.0 K/uL   Eosinophils  Relative 1 %   Eosinophils Absolute 0.1 0.0 - 0.5 K/uL   Basophils Relative 0 %   Basophils Absolute 0.0 0.0 - 0.1 K/uL   Immature Granulocytes 0 %   Abs Immature Granulocytes 0.02 0.00 - 0.07 K/uL    Comment: Performed at [facility], [address] [place], [place] [address]  Comprehensive metabolic panel     Status: Abnormal   Collection Time: 07/13/22  7:53 PM  Result Value Ref Range   Sodium 136 135 - 145 mmol/L   Potassium 4.0 3.5 - 5.1 mmol/L   Chloride 99 98 - 111 mmol/L   CO2 25 22 - 32 mmol/L   Glucose, Bld 138 (H) 70 - 99 mg/dL    Comment: Glucose reference range applies only to samples taken after fasting for at least 8 hours.   BUN 15 6 - 20 mg/dL   Creatinine, Ser 8.65 0.61 - 1.24 mg/dL   Calcium 9.3 8.9 - 78.4 mg/dL   Total Protein 7.0 6.5 - 8.1 g/dL   Albumin 4.1 3.5 - 5.0 g/dL   AST 16 15 - 41 U/L   ALT 13 0 - 44 U/L   Alkaline Phosphatase 109 38 - 126 U/L   Total Bilirubin 0.8 0.3 - 1.2 mg/dL   GFR, Estimated >69 >62 mL/min    Comment: (NOTE) Calculated using the CKD-EPI Creatinine Equation (2021)    Anion gap 12 5 - 15    Comment: Performed at [facility], [address], [place], [place] [address]  Lactic acid, plasma     Status: Abnormal   Collection Time: 07/13/22  7:53 PM  Result Value Ref Range   Lactic Acid, Venous 3.0 (HH) 0.5 - 1.9 mmol/L    Comment: CRITICAL RESULT CALLED TO, READ BACK BY AND VERIFIED WITH I Emeline Gins 2112 07/13/2022 WBOND Performed at [facility], [address] [place], [place] [address]   Blood culture (routine x 2)     Status: None (Preliminary result)   Collection Time: 07/13/22  7:53 PM   Specimen: BLOOD LEFT WRIST  Result Value Ref Range   Specimen Description BLOOD LEFT WRIST    Special Requests      BOTTLES DRAWN AEROBIC ONLY Blood Culture results may not be optimal due to an inadequate volume of blood received in culture bottles   Culture      NO GROWTH < 12  HOURS Performed at [facility], [address], [place], [place] [address]    Report Status PENDING   Troponin I (High Sensitivity)     Status: None   Collection Time: 07/13/22  7:53 PM  Result Value Ref Range   Troponin I (High Sensitivity) 3 <18 ng/L    Comment: (NOTE) Elevated high sensitivity troponin I (hsTnI) values and significant  changes across serial measurements may suggest ACS but many other  chronic and acute conditions are known to elevate hsTnI results.  Refer to the "Links" section for chest pain algorithms and additional  guidance. Performed at [facility], [address], [place], [place] [address]   Brain natriuretic peptide     Status: None   Collection Time: 07/13/22  7:53 PM  Result Value Ref Range   B Natriuretic Peptide 38.1 0.0 - 100.0 pg/mL    Comment: Performed at [facility], [address], [place], [place] [address]  Lactic acid, plasma     Status: None   Collection Time: 07/13/22  9:50 PM  Result Value Ref Range   Lactic Acid, Venous 1.3 0.5 - 1.9 mmol/L    Comment: Performed at [facility], [address] [place], [place] [address]  Troponin I (High Sensitivity)     Status: None   Collection Time: 07/13/22  9:50 PM  Result Value Ref Range   Troponin I (High Sensitivity) 3 <18 ng/L    Comment: (NOTE) Elevated high sensitivity troponin I (hsTnI) values and significant  changes across serial measurements may suggest ACS but many other  chronic and acute conditions are known to elevate hsTnI results.  Refer to the "Links" section for chest pain algorithms and additional  guidance. Performed at [facility], [address], [place], [place] [address]   HIV Antibody (routine testing w rflx)     Status: None   Collection Time: 07/14/22 12:03 AM  Result Value Ref Range   HIV Screen 4th Generation wRfx Non Reactive Non Reactive    Comment: Performed at [facility], [address], [place], [place] [address]  Basic metabolic panel      Status: Abnormal   Collection Time: 07/14/22 12:03 AM  Result Value Ref Range   Sodium 134 (L) 135 - 145 mmol/L   Potassium 3.7 3.5 - 5.1 mmol/L   Chloride 102 98 - 111 mmol/L   CO2 22 22 - 32 mmol/L   Glucose, Bld 159 (H) 70 - 99 mg/dL    Comment: Glucose reference range applies only to samples taken after fasting for at least 8 hours.   BUN 15 6 - 20 mg/dL   Creatinine, Ser 5.78 0.61 - 1.24 mg/dL   Calcium 8.3 (L) 8.9 - 10.3 mg/dL   GFR, Estimated >46 >96 mL/min    Comment: (NOTE) Calculated using the CKD-EPI Creatinine Equation (2021)    Anion gap 10 5 - 15    Comment: Performed at [facility], [address] [place], [place] [address]  CBC     Status: Abnormal   Collection Time: 07/14/22 12:03 AM  Result Value Ref Range   WBC 5.1 4.0 - 10.5 K/uL   RBC 4.23 4.22 - 5.81 MIL/uL   Hemoglobin 12.1 (L) 13.0 - 17.0 g/dL   HCT 41.3 (L) 24.4 - 01.0 %   MCV 82.3 80.0 - 100.0 fL   MCH 28.6 26.0 - 34.0 pg   MCHC 34.8 30.0 -  36.0 g/dL   RDW 16.1 09.6 - 04.5 %   Platelets 152 150 - 400 K/uL    Comment: REPEATED TO VERIFY   nRBC 0.0 0.0 - 0.2 %    Comment: Performed at [facility], [address], [place], [place] [address]  CBG monitoring, ED     Status: Abnormal   Collection Time: 07/14/22  7:54 AM  Result Value Ref Range   Glucose-Capillary 184 (H) 70 - 99 mg/dL    Comment: Glucose reference range applies only to samples taken after fasting for at least 8 hours.   CT Angio Chest PE W and/or Wo Contrast  Result Date: 07/14/2022 CLINICAL DATA:  Fever and cough EXAM: CT ANGIOGRAPHY CHEST WITH CONTRAST TECHNIQUE: Multidetector CT imaging of the chest was performed using the standard protocol during bolus administration of intravenous contrast. Multiplanar CT image reconstructions and MIPs were obtained to evaluate the vascular anatomy. RADIATION DOSE REDUCTION: This exam was performed according to the departmental dose-optimization program which includes automated exposure  control, adjustment of the mA and/or kV according to patient size and/or use of iterative reconstruction technique. CONTRAST:  75mL OMNIPAQUE IOHEXOL 350 MG/ML SOLN COMPARISON:  Chest x-ray from earlier in the same day. FINDINGS: Cardiovascular: Thoracic aorta shows no aneurysmal dilatation or dissection. Pulmonary artery shows a normal branching pattern bilaterally. No filling defect to suggest pulmonary embolism is noted. Heart is not significantly enlarged in size. No coronary calcifications are noted. Mediastinum/Nodes: Thoracic inlet is within normal limits. No hilar or mediastinal adenopathy is noted. Moderate to large hiatal hernia is noted. Lungs/Pleura: Lungs are well aerated bilaterally. No focal infiltrate or effusion is seen. Minimal emphysematous changes are seen. No parenchymal nodule is seen. Upper Abdomen: 3.2 cm cyst is noted in the midportion of the right kidney. No follow-up is recommended. The remainder of the upper abdomen is within normal limits. Musculoskeletal: Degenerative changes of the thoracic spine are noted. No acute rib abnormality is seen. Review of the MIP images confirms the above findings. IMPRESSION: No evidence of pulmonary emboli. Moderate to large hiatal hernia. No other focal abnormality is seen. Electronically Signed   By: Alcide Clever M.D.   On: 07/14/2022 00:00   DG Chest Port 1 View  Result Date: 07/13/2022 CLINICAL DATA:  Cough and fever EXAM: PORTABLE CHEST 1 VIEW COMPARISON:  Chest radiographs 11/08/2021 FINDINGS: Low lung volumes. Stable cardiomediastinal silhouette. No focal consolidation, pleural effusion, or pneumothorax. No displaced rib fractures. Elevated right hemidiaphragm. IMPRESSION: Low lung volumes. No acute cardiopulmonary disease. Electronically Signed   By: Minerva Fester M.D.   On: 07/13/2022 19:57    Pending Labs Unresulted Labs (From admission, onward)    None       Vitals/Pain Today's Vitals   07/14/22 0730 07/14/22 0800 07/14/22  0830 07/14/22 0853  BP: 97/63 105/75 (!) 127/107   Pulse: (!) 48 94    Resp: (!) 24 18 (!) 26   Temp:    (!) 97.4 F (36.3 C)  TempSrc:    Oral  SpO2: 100% 100%    Weight:      Height:      PainSc:        Isolation Precautions Airborne and Contact precautions  Medications Medications  acetaminophen (TYLENOL) suppository 650 mg (650 mg Rectal Given 07/13/22 2019)  enoxaparin (LOVENOX) injection 40 mg (has no administration in time range)  aspirin chewable tablet 81 mg (has no administration in time range)  albuterol (VENTOLIN HFA) 108 (90 Base) MCG/ACT  inhaler 2 puff (has no administration in time range)  latanoprost (XALATAN) 0.005 % ophthalmic solution 1 drop (1 drop Both Eyes Not Given 07/14/22 0120)  montelukast (SINGULAIR) tablet 10 mg (has no administration in time range)  pantoprazole (PROTONIX) EC tablet 40 mg (has no administration in time range)  rosuvastatin (CRESTOR) tablet 5 mg (5 mg Oral Given 07/14/22 0021)  timolol (TIMOPTIC) 0.5 % ophthalmic solution 1 drop (1 drop Both Eyes Given 07/14/22 0122)  insulin aspart (novoLOG) injection 0-9 Units (2 Units Subcutaneous Given 07/14/22 0848)  ipratropium-albuterol (DUONEB) 0.5-2.5 (3) MG/3ML nebulizer solution 3 mL (3 mLs Nebulization Given 07/13/22 2019)  sodium chloride 0.9 % bolus 1,000 mL (0 mLs Intravenous Stopped 07/13/22 2132)  cefTRIAXone (ROCEPHIN) 1 g in sodium chloride 0.9 % 100 mL IVPB (0 g Intravenous Stopped 07/13/22 2153)  azithromycin (ZITHROMAX) 500 mg in sodium chloride 0.9 % 250 mL IVPB (0 mg Intravenous Stopped 07/13/22 2301)  dexamethasone (DECADRON) injection 10 mg (10 mg Intravenous Given 07/13/22 2224)  iohexol (OMNIPAQUE) 350 MG/ML injection 75 mL (75 mLs Intravenous Contrast Given 07/13/22 2349)    Mobility power wheelchair     Focused Assessments Pulmonary Assessment Handoff:  Lung sounds: Bilateral Breath Sounds: Diminished O2 Device: Room Air      R Recommendations: See Admitting Provider  Note  Report given to:   Additional Notes: COVID+. Hx of cerebral palsy

## 2022-07-14 NOTE — Progress Notes (Signed)
Pharmacy Antibiotic Note  Thomas Ramos is a 52 y.o. male admitted on 07/13/2022 with COVID.  Pharmacy has been consulted for paxlovid dosing.  Patient with 1 day of symptoms, has diabetes, HTN, and HFpEF, ok to use paxlovid. Will hold rosuvastatin while on Paxlovid. No other major drug interactions.   Plan: Paxlovid 300/100 dose pack BID x5 days  Hold rosuvastatin while on Paxlovid  Height: 5\' 6"  (167.6 cm) Weight: 81.6 kg (180 lb) IBW/kg (Calculated) : 63.8  Temp (24hrs), Avg:99.3 F (37.4 C), Min:97.3 F (36.3 C), Max:103.4 F (39.7 C)  Recent Labs  Lab 07/13/22 1953 07/13/22 2150 07/14/22 0003  WBC 6.8  --  5.1  CREATININE 1.10  --  0.96  LATICACIDVEN 3.0* 1.3  --     Estimated Creatinine Clearance: 92.3 mL/min (by C-G formula based on SCr of 0.96 mg/dL).    Allergies  Allergen Reactions   Penicillins Hives, Nausea And Vomiting and Other (See Comments)    Patient tolerated cefazolin in 2017 Has patient had a PCN reaction causing immediate rash, facial/tongue/throat swelling, SOB or lightheadedness with hypotension: Yes Has patient had a PCN reaction causing severe rash involving mucus membranes or skin necrosis: No Has patient had a PCN reaction that required hospitalization No Has patient had a PCN reaction occurring within the last 10 years: Yes If all of the above answers are "NO", then may proceed with Cephalosporin use.   Sulfamethoxazole-Trimethoprim Nausea And Vomiting   Metronidazole Nausea And Vomiting     Thank you for allowing pharmacy to be a part of this patient's care.  Alphia Moh, PharmD, BCPS, BCCP Clinical Pharmacist  Please check AMION for all [facility] Pharmacy phone numbers After 10:00 PM, call Main Pharmacy [phone]

## 2022-07-14 NOTE — Progress Notes (Signed)
   Subjective:   Patient is feeling better today compared to admission.  Cough is improved.  Shortness of breath has also improved. Still has some chest pain only when he coughs.  He is anxious about returning home given his comorbidities and social support outside the hospital.  Objective:  Vital signs in last 24 hours: Vitals:   07/14/22 0447 07/14/22 0500 07/14/22 0530 07/14/22 0600  BP:  (!) 89/62 92/69 91/66   Pulse:  (!) 56 62 (!) 56  Resp:  15 10 (!) 9  Temp: (!) 97.5 F (36.4 C)     TempSrc: Oral     SpO2:  96% 96% 97%  Weight:      Height:       Physical Exam: Constitutional: resting in bed, not in acute distress, pleasant Cardio:Regular rate and rhythm. No murmurs, rubs, or gallops.  Pulmonary: Normal work of breathing on room air. No wheezes or crackles.  Abdomen: Soft, non-tender, non-distended, normal bowel sounds. MSK: Extremities contracted Skin: Warm and dry. Neuro: AxO x3.  Chronic deficits from cerebral palsy, immobile legs.   Assessment/Plan:  Principal Problem:   COVID-19 Active Problems:   Chronic diastolic (congestive) heart failure (HCC)   OSA (obstructive sleep apnea)   Essential hypertension   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HCC)   Type 2 diabetes mellitus with complication, without long-term current use of insulin (HCC)  Thomas Ramos is a 52 y.o. 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   # COVID-19 Presented w/ acute onset cough, SOB, fever, pleuritic chest pain. Positive for COVID-19, no acute findings on CXR/CT PE study. Received IV dexamethasone, ceftriaxone, and azithromycin while in the ED. Satting well on RA today. Remains afebrile this morning. Symptoms have improved, however, given his significant comorbidities, will start paxlovid and reassess. Will need to be  evaluated by PT/OT tomorrow.  - Supplemental oxygen as necessary - Paxlovid for 5 days (day 1/5) - Tylenol 650 mg q6h prn - Albuterol q6h prn - Trend fever curve - Trend CBC - PT/OT eval  # HFpEF Echo from 07/2020 w/ EF 70-75% w/ hyperdynamic LV, mild LVH, and G1DD. Follows w/ Dr. Jacques Navy, last seen in 12/2021. Note at this time stated his metoprolol was being held due to fatigue. However, patient reports taking this medication regularly. Given low-normal BP and HR this morning will continue to hold.  - Hold metoprolol, valsartan, lasix  # HTN BP remains at lower limit of normal. Holding home amlodipine, valsartan, lasix, metoprolol.  - Trend BP   # T2DM A1C 6.3% in 03/2022. Previously on metformin/Jardiance. Currently using dulaglutide 0.75 mg weekly at home. CBGs well controlled this morning.  - Sensitive SSI  # AKI, resolved Baseline Cr ~ 0.7-0.9. Cr 1.1 on admission. Improved to 0.96 this morning.  - Trend BMP   # OSA Does not have CPAP at home. Does not want to try CPAP at this time.    Diet: CM  Bowel: none VTE: lovenox IVF: none Code: full PT/OT recs: none LOS: day 1   Prior to Admission Living Arrangement: home w/ caretaker next-door Anticipated Discharge Location: TBD Barriers to Discharge: continued management Dispo: Anticipated discharge in approximately more than 2 day(s).   Thomas Caldwell, MD 07/14/2022, 6:50 AM Pager: [phone] After 5pm on weekdays and 1pm on weekends: On Call pager [phone]

## 2022-07-14 NOTE — ED Notes (Signed)
ED TO INPATIENT HANDOFF REPORT  ED Nurse Name and Phone #: laura [phone]  S Name/Age/Gender Thomas Ramos 52 y.o. male Room/Bed: 044C/044C  Code Status   Code Status: Full Code  Home/SNF/Other Home Patient oriented to: self, place, time, and situation Is this baseline? Yes   Triage Complete: Triage complete  Chief Complaint COVID [U07.1]  Triage Note No notes on file   Allergies Allergies  Allergen Reactions   Penicillins Hives, Nausea And Vomiting and Other (See Comments)    Patient tolerated cefazolin in 2017 Has patient had a PCN reaction causing immediate rash, facial/tongue/throat swelling, SOB or lightheadedness with hypotension: Yes Has patient had a PCN reaction causing severe rash involving mucus membranes or skin necrosis: No Has patient had a PCN reaction that required hospitalization No Has patient had a PCN reaction occurring within the last 10 years: Yes If all of the above answers are "NO", then may proceed with Cephalosporin use.   Sulfamethoxazole-Trimethoprim Nausea And Vomiting   Metronidazole Nausea And Vomiting    Level of Care/Admitting Diagnosis ED Disposition     ED Disposition  Admit   Condition  --   Comment  Hospital Area: [facility] [[id number]]  Level of Care: Med-Surg [16]  May place patient in observation at [facility] or [facility] if equivalent level of care is available:: Yes  Covid Evaluation: Confirmed COVID Positive  Diagnosis: COVID [[id number]]  Admitting Physician: Tyson Alias [[id number]]  Attending Physician: Tyson Alias [[id number]]          B Medical/Surgery History Past Medical History:  Diagnosis Date   Anxiety    Asthma    Cellulitis 08/04/2017   Cerebral palsy (HCC)    Chronic diastolic (congestive) heart failure (HCC)    CP (cerebral palsy) (HCC)    Diabetes mellitus without complication (HCC)    borderline   Drug induced constipation    Environmental allergies    takes  inhalers if needed   Esophageal stricture    GERD (gastroesophageal reflux disease)    Hypertension    Motility disorder, esophageal    Pneumonia    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HCC)    S/P Botox injection    approx every 4 months   Seasonal allergies    Past Surgical History:  Procedure Laterality Date   BIOPSY  06/24/2021   Procedure: BIOPSY;  Surgeon: Sherrilyn Rist, MD;  Location: [facility];  Service: Gastroenterology;;   BOTOX INJECTION N/A 12/21/2012   Procedure: BOTOX INJECTION;  Surgeon: Louis Meckel, MD;  Location: [facility] ENDOSCOPY;  Service: Endoscopy;  Laterality: N/A;   BOTOX INJECTION N/A 06/28/2014   Procedure: BOTOX INJECTION;  Surgeon: Louis Meckel, MD;  Location[facility] ENDOSCOPY;  Service: Endoscopy;  Laterality: N/A;   COLONOSCOPY WITH PROPOFOL N/A 06/24/2021   Procedure: COLONOSCOPY WITH PROPOFOL;  Surgeon: Sherrilyn Rist, MD;  Location: [facility] ENDOSCOPY;  Service: Gastroenterology;  Laterality: N/A;   ESOPHAGOGASTRODUODENOSCOPY N/A 12/21/2012   Procedure: ESOPHAGOGASTRODUODENOSCOPY (EGD);  Surgeon: Louis Meckel, MD;  Location: [facility];  Service: Endoscopy;  Laterality: N/A;   ESOPHAGOGASTRODUODENOSCOPY N/A 06/28/2014   Procedure: ESOPHAGOGASTRODUODENOSCOPY (EGD);  Surgeon: Louis Meckel, MD;  Location: [facility] ENDOSCOPY;  Service: Endoscopy;  Laterality: N/A;   ESOPHAGOGASTRODUODENOSCOPY N/A 06/24/2021   Procedure: ESOPHAGOGASTRODUODENOSCOPY (EGD);  Surgeon: Sherrilyn Rist, MD;  Location: [facility];  Service: Gastroenterology;  Laterality: N/A;   ESOPHAGOGASTRODUODENOSCOPY (EGD) WITH PROPOFOL N/A 09/03/2017   Procedure: ESOPHAGOGASTRODUODENOSCOPY (EGD) WITH PROPOFOL;  Surgeon: Sherrilyn Rist, MD;  Location: [facility];  Service: Gastroenterology;  Laterality: N/A;   ESOPHAGUS SURGERY     stretched esophagus   legs     MOUTH SURGERY     POLYPECTOMY  06/24/2021   Procedure: POLYPECTOMY;  Surgeon: Sherrilyn Rist, MD;  Location: [facility] ENDOSCOPY;   Service: Gastroenterology;;   TENDON RELEASE       A IV Location/Drains/Wounds Patient Lines/Drains/Airways Status     Active Line/Drains/Airways     Name Placement date Placement time Site Days   Peripheral IV 07/13/22 20 G Left;Posterior Hand 07/13/22  1951  Hand  1   Peripheral IV 07/13/22 20 G Left Antecubital 07/13/22  1950  Antecubital  1   Pressure Injury 03/21/19 Sacrum Medial Stage 1 -  Intact skin with non-blanchable redness of a localized area usually over a bony prominence. 03/21/19  2326  -- 1211            Intake/Output Last 24 hours  Intake/Output Summary (Last 24 hours) at 07/14/2022 0840 Last data filed at 07/13/2022 2301 Gross per 24 hour  Intake 1350 ml  Output --  Net 1350 ml    Labs/Imaging Results for orders placed or performed during the hospital encounter of 07/13/22 (from the past 48 hour(s))  Resp panel by RT-PCR (RSV, Flu A&B, Covid) Anterior Nasal Swab     Status: Abnormal   Collection Time: 07/13/22  7:37 PM   Specimen: Anterior Nasal Swab  Result Value Ref Range   SARS Coronavirus 2 by RT PCR POSITIVE (A) NEGATIVE   Influenza A by PCR NEGATIVE NEGATIVE   Influenza B by PCR NEGATIVE NEGATIVE    Comment: (NOTE) The Xpert Xpress SARS-CoV-2/FLU/RSV plus assay is intended as an aid in the diagnosis of influenza from Nasopharyngeal swab specimens and should not be used as a sole basis for treatment. Nasal washings and aspirates are unacceptable for Xpert Xpress SARS-CoV-2/FLU/RSV testing.  Fact Sheet for Patients: BloggerCourse.com  Fact Sheet for Healthcare Providers: SeriousBroker.it  This test is not yet approved or cleared by the [place] FDA and has been authorized for detection and/or diagnosis of SARS-CoV-2 by FDA under an Emergency Use Authorization (EUA). This EUA will remain in effect (meaning this test can be used) for the duration of the COVID-19 declaration under Section  564(b)(1) of the Act, 21 U.S.C. section 360bbb-3(b)(1), unless the authorization is terminated or revoked.     Resp Syncytial Virus by PCR NEGATIVE NEGATIVE    Comment: (NOTE) Fact Sheet for Patients: BloggerCourse.com  Fact Sheet for Healthcare Providers: SeriousBroker.it  This test is not yet approved or cleared by the [place] FDA and has been authorized for detection and/or diagnosis of SARS-CoV-2 by FDA under an Emergency Use Authorization (EUA). This EUA will remain in effect (meaning this test can be used) for the duration of the COVID-19 declaration under Section 564(b)(1) of the Act, 21 U.S.C. section 360bbb-3(b)(1), unless the authorization is terminated or revoked.  Performed at [facility], [address] [place], [place] [address]   Blood culture (routine x 2)     Status: None (Preliminary result)   Collection Time: 07/13/22  7:45 PM   Specimen: BLOOD  Result Value Ref Range   Specimen Description BLOOD LEFT ANTECUBITAL    Special Requests      BOTTLES DRAWN AEROBIC AND ANAEROBIC Blood Culture results may not be optimal due to an excessive volume of blood received in culture bottles  Culture      NO GROWTH < 12 HOURS Performed at [facility], [address] [place], [place] [address]    Report Status PENDING   CBC with Differential     Status: None   Collection Time: 07/13/22  7:53 PM  Result Value Ref Range   WBC 6.8 4.0 - 10.5 K/uL   RBC 5.35 4.22 - 5.81 MIL/uL   Hemoglobin 14.7 13.0 - 17.0 g/dL   HCT 60.4 54.0 - 98.1 %   MCV 83.2 80.0 - 100.0 fL   MCH 27.5 26.0 - 34.0 pg   MCHC 33.0 30.0 - 36.0 g/dL   RDW 19.1 47.8 - 29.5 %   Platelets 217 150 - 400 K/uL   nRBC 0.0 0.0 - 0.2 %   Neutrophils Relative % 78 %   Neutro Abs 5.2 1.7 - 7.7 K/uL   Lymphocytes Relative 14 %   Lymphs Abs 1.0 0.7 - 4.0 K/uL   Monocytes Relative 7 %   Monocytes Absolute 0.5 0.1 - 1.0 K/uL   Eosinophils  Relative 1 %   Eosinophils Absolute 0.1 0.0 - 0.5 K/uL   Basophils Relative 0 %   Basophils Absolute 0.0 0.0 - 0.1 K/uL   Immature Granulocytes 0 %   Abs Immature Granulocytes 0.02 0.00 - 0.07 K/uL    Comment: Performed at [facility], [address], [place], [place] [address]  Comprehensive metabolic panel     Status: Abnormal   Collection Time: 07/13/22  7:53 PM  Result Value Ref Range   Sodium 136 135 - 145 mmol/L   Potassium 4.0 3.5 - 5.1 mmol/L   Chloride 99 98 - 111 mmol/L   CO2 25 22 - 32 mmol/L   Glucose, Bld 138 (H) 70 - 99 mg/dL    Comment: Glucose reference range applies only to samples taken after fasting for at least 8 hours.   BUN 15 6 - 20 mg/dL   Creatinine, Ser 8.65 0.61 - 1.24 mg/dL   Calcium 9.3 8.9 - 78.4 mg/dL   Total Protein 7.0 6.5 - 8.1 g/dL   Albumin 4.1 3.5 - 5.0 g/dL   AST 16 15 - 41 U/L   ALT 13 0 - 44 U/L   Alkaline Phosphatase 109 38 - 126 U/L   Total Bilirubin 0.8 0.3 - 1.2 mg/dL   GFR, Estimated >69 >62 mL/min    Comment: (NOTE) Calculated using the CKD-EPI Creatinine Equation (2021)    Anion gap 12 5 - 15    Comment: Performed at [facility], [address], [place], [place] [address]  Lactic acid, plasma     Status: Abnormal   Collection Time: 07/13/22  7:53 PM  Result Value Ref Range   Lactic Acid, Venous 3.0 (HH) 0.5 - 1.9 mmol/L    Comment: CRITICAL RESULT CALLED TO, READ BACK BY AND VERIFIED WITH I Emeline Gins 2112 07/13/2022 WBOND Performed at [facility], [address], [place], [place] [address]   Blood culture (routine x 2)     Status: None (Preliminary result)   Collection Time: 07/13/22  7:53 PM   Specimen: BLOOD LEFT WRIST  Result Value Ref Range   Specimen Description BLOOD LEFT WRIST    Special Requests      BOTTLES DRAWN AEROBIC ONLY Blood Culture results may not be optimal due to an inadequate volume of blood received in culture bottles   Culture      NO GROWTH < 12  HOURS Performed at [facility], [address] [place], [place] [address]    Report Status PENDING   Troponin I (High Sensitivity)     Status: None   Collection Time: 07/13/22  7:53 PM  Result Value Ref Range   Troponin I (High Sensitivity) 3 <18 ng/L    Comment: (NOTE) Elevated high sensitivity troponin I (hsTnI) values and significant  changes across serial measurements may suggest ACS but many other  chronic and acute conditions are known to elevate hsTnI results.  Refer to the "Links" section for chest pain algorithms and additional  guidance. Performed at [facility], [address] [place], [place] [address]   Brain natriuretic peptide     Status: None   Collection Time: 07/13/22  7:53 PM  Result Value Ref Range   B Natriuretic Peptide 38.1 0.0 - 100.0 pg/mL    Comment: Performed at [facility], [address], [place], [place] [address]  Lactic acid, plasma     Status: None   Collection Time: 07/13/22  9:50 PM  Result Value Ref Range   Lactic Acid, Venous 1.3 0.5 - 1.9 mmol/L    Comment: Performed at [facility], [address], [place], [place] [address]  Troponin I (High Sensitivity)     Status: None   Collection Time: 07/13/22  9:50 PM  Result Value Ref Range   Troponin I (High Sensitivity) 3 <18 ng/L    Comment: (NOTE) Elevated high sensitivity troponin I (hsTnI) values and significant  changes across serial measurements may suggest ACS but many other  chronic and acute conditions are known to elevate hsTnI results.  Refer to the "Links" section for chest pain algorithms and additional  guidance. Performed at [facility], [address], [place], [place] [address]   HIV Antibody (routine testing w rflx)     Status: None   Collection Time: 07/14/22 12:03 AM  Result Value Ref Range   HIV Screen 4th Generation wRfx Non Reactive Non Reactive    Comment: Performed at [facility], [address] [place], [place] [address]  Basic metabolic panel      Status: Abnormal   Collection Time: 07/14/22 12:03 AM  Result Value Ref Range   Sodium 134 (L) 135 - 145 mmol/L   Potassium 3.7 3.5 - 5.1 mmol/L   Chloride 102 98 - 111 mmol/L   CO2 22 22 - 32 mmol/L   Glucose, Bld 159 (H) 70 - 99 mg/dL    Comment: Glucose reference range applies only to samples taken after fasting for at least 8 hours.   BUN 15 6 - 20 mg/dL   Creatinine, Ser 5.78 0.61 - 1.24 mg/dL   Calcium 8.3 (L) 8.9 - 10.3 mg/dL   GFR, Estimated >46 >96 mL/min    Comment: (NOTE) Calculated using the CKD-EPI Creatinine Equation (2021)    Anion gap 10 5 - 15    Comment: Performed at [facility], [address], [place], [place] [address]  CBC     Status: Abnormal   Collection Time: 07/14/22 12:03 AM  Result Value Ref Range   WBC 5.1 4.0 - 10.5 K/uL   RBC 4.23 4.22 - 5.81 MIL/uL   Hemoglobin 12.1 (L) 13.0 - 17.0 g/dL   HCT 41.3 (L) 24.4 - 01.0 %   MCV 82.3 80.0 - 100.0 fL   MCH 28.6 26.0 - 34.0 pg   MCHC 34.8 30.0 -  36.0 g/dL   RDW 95.6 21.3 - 08.6 %   Platelets 152 150 - 400 K/uL    Comment: REPEATED TO VERIFY   nRBC 0.0 0.0 - 0.2 %    Comment: Performed at [facility], [address] [place], [place] [address]  CBG monitoring, ED     Status: Abnormal   Collection Time: 07/14/22  7:54 AM  Result Value Ref Range   Glucose-Capillary 184 (H) 70 - 99 mg/dL    Comment: Glucose reference range applies only to samples taken after fasting for at least 8 hours.   CT Angio Chest PE W and/or Wo Contrast  Result Date: 07/14/2022 CLINICAL DATA:  Fever and cough EXAM: CT ANGIOGRAPHY CHEST WITH CONTRAST TECHNIQUE: Multidetector CT imaging of the chest was performed using the standard protocol during bolus administration of intravenous contrast. Multiplanar CT image reconstructions and MIPs were obtained to evaluate the vascular anatomy. RADIATION DOSE REDUCTION: This exam was performed according to the departmental dose-optimization program which includes automated exposure  control, adjustment of the mA and/or kV according to patient size and/or use of iterative reconstruction technique. CONTRAST:  75mL OMNIPAQUE IOHEXOL 350 MG/ML SOLN COMPARISON:  Chest x-ray from earlier in the same day. FINDINGS: Cardiovascular: Thoracic aorta shows no aneurysmal dilatation or dissection. Pulmonary artery shows a normal branching pattern bilaterally. No filling defect to suggest pulmonary embolism is noted. Heart is not significantly enlarged in size. No coronary calcifications are noted. Mediastinum/Nodes: Thoracic inlet is within normal limits. No hilar or mediastinal adenopathy is noted. Moderate to large hiatal hernia is noted. Lungs/Pleura: Lungs are well aerated bilaterally. No focal infiltrate or effusion is seen. Minimal emphysematous changes are seen. No parenchymal nodule is seen. Upper Abdomen: 3.2 cm cyst is noted in the midportion of the right kidney. No follow-up is recommended. The remainder of the upper abdomen is within normal limits. Musculoskeletal: Degenerative changes of the thoracic spine are noted. No acute rib abnormality is seen. Review of the MIP images confirms the above findings. IMPRESSION: No evidence of pulmonary emboli. Moderate to large hiatal hernia. No other focal abnormality is seen. Electronically Signed   By: Alcide Clever M.D.   On: 07/14/2022 00:00   DG Chest Port 1 View  Result Date: 07/13/2022 CLINICAL DATA:  Cough and fever EXAM: PORTABLE CHEST 1 VIEW COMPARISON:  Chest radiographs 11/08/2021 FINDINGS: Low lung volumes. Stable cardiomediastinal silhouette. No focal consolidation, pleural effusion, or pneumothorax. No displaced rib fractures. Elevated right hemidiaphragm. IMPRESSION: Low lung volumes. No acute cardiopulmonary disease. Electronically Signed   By: Minerva Fester M.D.   On: 07/13/2022 19:57    Pending Labs Unresulted Labs (From admission, onward)    None       Vitals/Pain Today's Vitals   07/14/22 0700 07/14/22 0730 07/14/22  0800 07/14/22 0830  BP: 93/74 97/63 105/75 (!) 127/107  Pulse: (!) 50 (!) 48 94   Resp: 11 (!) 24 18 (!) 26  Temp:      TempSrc:      SpO2: 96% 100% 100%   Weight:      Height:      PainSc:        Isolation Precautions Airborne and Contact precautions  Medications Medications  acetaminophen (TYLENOL) suppository 650 mg (650 mg Rectal Given 07/13/22 2019)  enoxaparin (LOVENOX) injection 40 mg (has no administration in time range)  aspirin chewable tablet 81 mg (has no administration in time range)  albuterol (VENTOLIN HFA) 108 (90 Base) MCG/ACT inhaler 2 puff (  has no administration in time range)  latanoprost (XALATAN) 0.005 % ophthalmic solution 1 drop (1 drop Both Eyes Not Given 07/14/22 0120)  montelukast (SINGULAIR) tablet 10 mg (has no administration in time range)  pantoprazole (PROTONIX) EC tablet 40 mg (has no administration in time range)  rosuvastatin (CRESTOR) tablet 5 mg (5 mg Oral Given 07/14/22 0021)  timolol (TIMOPTIC) 0.5 % ophthalmic solution 1 drop (1 drop Both Eyes Given 07/14/22 0122)  insulin aspart (novoLOG) injection 0-9 Units (has no administration in time range)  ipratropium-albuterol (DUONEB) 0.5-2.5 (3) MG/3ML nebulizer solution 3 mL (3 mLs Nebulization Given 07/13/22 2019)  sodium chloride 0.9 % bolus 1,000 mL (0 mLs Intravenous Stopped 07/13/22 2132)  cefTRIAXone (ROCEPHIN) 1 g in sodium chloride 0.9 % 100 mL IVPB (0 g Intravenous Stopped 07/13/22 2153)  azithromycin (ZITHROMAX) 500 mg in sodium chloride 0.9 % 250 mL IVPB (0 mg Intravenous Stopped 07/13/22 2301)  dexamethasone (DECADRON) injection 10 mg (10 mg Intravenous Given 07/13/22 2224)  iohexol (OMNIPAQUE) 350 MG/ML injection 75 mL (75 mLs Intravenous Contrast Given 07/13/22 2349)    Mobility non-ambulatory     Focused Assessments Pulmonary Assessment Handoff:  Lung sounds: Bilateral Breath Sounds: Diminished O2 Device: Room Air      R Recommendations: See Admitting Provider Note  Report  given to:   Additional Notes Thomas Ramos is a 52 y/o male with PMH of HTN, T2DM, OSA, HFpEF, and 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  He was feeling himself up until last night whenever he started to have a nonproductive cough with associated dyspnea and sharp chest pain when he coughed.  He did have some similar chest pain last week and saw his primary care provider about this who told him to take some Tylenol and go to the emergency department if this did not go away.  Did not recur until he started having this cough.  He denies any known sick contacts but does frequently go to the store.  Denies any sore throat, nausea, vomiting, diarrhea, abdominal pain, dysuria, presyncope, or syncope.   ED Course: Presented febrile, tachypneic, mildly tachycardic, and borderline low pressures but satting well on room air.  Labs showed elevated lactic acid at 3.0 and creatinine of 1.1 without leukocytosis, troponin elevation, BNP elevation, or other electrolyte abnormalities.  Found to be COVID-positive without focal chest x-ray abnormality.  IMTS paged for admission.

## 2022-07-14 NOTE — Evaluation (Signed)
Physical Therapy Evaluation Patient Details Name: Thomas Ramos MRN: [id number] DOB: Mar 30, 1970 Today's Date: 07/14/2022  History of Present Illness  Pt is a 52 y.o. male admitted 07/13/22 with cough, dyspnea, fever, pleuritic chest pain; workup for COVID-19. 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   Clinical Impression  Pt presents with an overall decrease in functional mobility secondary to above. PTA, pt lives with roommate, has 24/7 caregiver assist available who lives next door; pt is hoyer lift-dependent at baseline, has Human resources officer w/c he uses daily. Today, pt requiring min-maxA for bed mobility, declines OOB transfer secondary to chest pain and fatigue. Educ on activity recommendations and importance of upright positioning. Pt reports no follow-up PT or DME needs. Will follow acutely to address established goals.   Recommendations for follow up therapy are one component of a multi-disciplinary discharge planning process, led by the attending physician.  Recommendations may be updated based on patient status, additional functional criteria and insurance authorization.      Assistance Recommended at Discharge Intermittent Supervision/Assistance  Patient can return home with the following  A lot of help with walking and/or transfers;A lot of help with bathing/dressing/bathroom;Assist for transportation;Help with stairs or ramp for entrance;Assistance with cooking/housework    Equipment Recommendations None recommended by PT  Recommendations for Other Services       Functional Status Assessment Patient has had a recent decline in their functional status and demonstrates the ability to make significant improvements in function in a reasonable and predictable amount of time.     Precautions / Restrictions Precautions Precautions: Fall;Other (comment) Precaution Comments: h/o congenital  cerebral palsy with quadriplegic spinal paralysis Restrictions Weight Bearing Restrictions: No      Mobility  Bed Mobility Overal bed mobility: Needs Assistance Bed Mobility: Rolling Rolling: Max assist         General bed mobility comments: maxA to roll onto R-side, pt able to roll trunk back from partial R sidelying to supine without assist though BLEs staying rolled to R; elevated HOB    Transfers                   General transfer comment: pt declined sitting EOB or OOB transfer via lift, states, "I just don't feel like it today... I'd like to next time"    Ambulation/Gait                  Stairs            Wheelchair Mobility    Modified Rankin (Stroke Patients Only)       Balance                                             Pertinent Vitals/Pain Pain Assessment Pain Assessment: Faces Faces Pain Scale: Hurts little more Pain Location: R-side lower chest Pain Intervention(s): Limited activity within patient's tolerance, Other (comment) (ice had been applied by nursing)    Home Living Family/patient expects to be discharged to:: Private residence Living Arrangements: Non-relatives/Friends Available Help at Discharge: Personal care attendant;Available 24 hours/day Type of Home: Apartment Home Access: Level entry       Home Layout: One level Home Equipment: Wheelchair - power;Hospital bed;Other (comment) (hoyer lift) Additional Comments: pt lives with 22 y.o. roommate, but has 24/7 caregiver available who lives next door (  Tanya)    Prior Function Prior Level of Function : Needs assist             Mobility Comments: dependent mobility with hoyer lift, sleeps in hospital bed, spends majority of day in specialized electric w/c ADLs Comments: dependent - wears briefs, reports caregiver assists with all ADLs, majority at chair-level instead of bed-level     Hand Dominance   Dominant Hand: Left     Extremity/Trunk Assessment   Upper Extremity Assessment Upper Extremity Assessment: RUE deficits/detail;LUE deficits/detail RUE Deficits / Details: gross observed RUE strength <2/5 with notable flexor contractures RUE Coordination: decreased fine motor;decreased gross motor LUE Deficits / Details: gross observed L hand/wrist and elbow strength >/ 3/5, shoulder not tested but able to abd/flex at least ~90' LUE Coordination: decreased fine motor;decreased gross motor    Lower Extremity Assessment Lower Extremity Assessment: RLE deficits/detail;LLE deficits/detail RLE Deficits / Details: BLE strength <2/5 with some flexor contractures noted; notable lower leg swelling; pt endorses in tact sensation. pt reports never standing/or walking on his legs RLE Coordination: decreased gross motor;decreased fine motor LLE Deficits / Details: BLE strength <2/5 with some flexor contractures noted; notable lower leg swelling; pt endorses in tact sensation. pt reports never standing/or walking on his legs       Communication   Communication: Expressive difficulties (slurred speech but understandable)  Cognition Arousal/Alertness: Awake/alert Behavior During Therapy: WFL for tasks assessed/performed Overall Cognitive Status: Within Functional Limits for tasks assessed                                 General Comments: WFL for simple tasks, not formally assessed        General Comments General comments (skin integrity, edema, etc.): educ re: role of acute PT, POC, activity recommendations, importance of sitting upright in bed via HOB elevated, availability of hoyer lift for OOB transfers while admitted, DME needs and discharge recommendations. pt reports no DME or follow-up PT needs, has necessary assist from caregiver    Exercises     Assessment/Plan    PT Assessment Patient needs continued PT services  PT Problem List Decreased mobility;Cardiopulmonary status limiting activity        PT Treatment Interventions Functional mobility training;Therapeutic activities;Therapeutic exercise;Patient/family education    PT Goals (Current goals can be found in the Care Plan section)  Acute Rehab PT Goals Patient Stated Goal: return home when he feels better PT Goal Formulation: With patient Time For Goal Achievement: 07/28/22 Potential to Achieve Goals: Good    Frequency Min 2X/week     Co-evaluation               AM-PAC PT "6 Clicks" Mobility  Outcome Measure Help needed turning from your back to your side while in a flat bed without using bedrails?: Total Help needed moving from lying on your back to sitting on the side of a flat bed without using bedrails?: Total Help needed moving to and from a bed to a chair (including a wheelchair)?: Total Help needed standing up from a chair using your arms (e.g., wheelchair or bedside chair)?: Total Help needed to walk in hospital room?: Total Help needed climbing 3-5 steps with a railing? : Total 6 Click Score: 6    End of Session   Activity Tolerance: Patient limited by pain;Patient limited by fatigue Patient left: in bed;with call bell/phone within reach;with bed alarm set Nurse Communication: Mobility status;Need for  lift equipment PT Visit Diagnosis: Other abnormalities of gait and mobility (R26.89)    Time: 1914-7829 PT Time Calculation (min) (ACUTE ONLY): 18 min   Charges:   PT Evaluation $PT Eval Moderate Complexity: 1 Mod        Ina Homes, PT, DPT Acute Rehabilitation Services  Personal: Secure Chat Rehab Office: [phone]  Malachy Chamber 07/14/2022, 3:50 PM

## 2022-07-15 DIAGNOSIS — U071 COVID-19: Secondary | ICD-10-CM | POA: Diagnosis not present

## 2022-07-15 LAB — GLUCOSE, CAPILLARY
Glucose-Capillary: 138 mg/dL — ABNORMAL HIGH (ref 70–99)
Glucose-Capillary: 150 mg/dL — ABNORMAL HIGH (ref 70–99)
Glucose-Capillary: 171 mg/dL — ABNORMAL HIGH (ref 70–99)
Glucose-Capillary: 176 mg/dL — ABNORMAL HIGH (ref 70–99)

## 2022-07-15 MED ORDER — ACETAMINOPHEN 325 MG PO TABS
650.0000 mg | ORAL_TABLET | Freq: Four times a day (QID) | ORAL | Status: DC
  Administered 2022-07-15 – 2022-07-16 (×5): 650 mg via ORAL
  Filled 2022-07-15 (×5): qty 2

## 2022-07-15 MED ORDER — DM-GUAIFENESIN ER 30-600 MG PO TB12: 1.0000 | ORAL_TABLET | Freq: Two times a day (BID) | ORAL | Status: DC

## 2022-07-15 MED ORDER — ACETAMINOPHEN 325 MG PO TABS: 650.0000 mg | ORAL_TABLET | Freq: Four times a day (QID) | ORAL | Status: DC | PRN

## 2022-07-15 MED ORDER — METOPROLOL SUCCINATE ER 25 MG PO TB24
25.0000 mg | ORAL_TABLET | Freq: Every day | ORAL | Status: DC
  Administered 2022-07-15 – 2022-07-16 (×2): 25 mg via ORAL
  Filled 2022-07-15 (×2): qty 1

## 2022-07-15 MED ORDER — METOPROLOL SUCCINATE ER 25 MG PO TB24: 25.0000 mg | ORAL_TABLET | Freq: Every day | ORAL | Status: DC

## 2022-07-15 MED ORDER — FUROSEMIDE 20 MG PO TABS: 20.0000 mg | ORAL_TABLET | Freq: Every day | ORAL | Status: DC

## 2022-07-15 MED ORDER — DM-GUAIFENESIN ER 30-600 MG PO TB12
1.0000 | ORAL_TABLET | Freq: Two times a day (BID) | ORAL | Status: DC
  Administered 2022-07-15 – 2022-07-16 (×3): 1 via ORAL
  Filled 2022-07-15 (×3): qty 1

## 2022-07-15 MED ORDER — FUROSEMIDE 20 MG PO TABS
20.0000 mg | ORAL_TABLET | Freq: Every day | ORAL | Status: DC
  Administered 2022-07-16: 20 mg via ORAL
  Filled 2022-07-15: qty 1

## 2022-07-15 MED ORDER — IRBESARTAN 75 MG PO TABS
75.0000 mg | ORAL_TABLET | Freq: Every day | ORAL | Status: DC
  Filled 2022-07-15: qty 1

## 2022-07-15 NOTE — Discharge Instructions (Addendum)
You were hospitalized for COVID.  Hospital Course: You tested positive for COVID. We suspect your fever, cough, shortness of breath, and chest pain are due to this infection. COVID is a virus that your body will fight off on its own. However, to help your body fight off the infection, we have started you on a medication called Paxlovid which you will take for 3 more days after discharge from the hospital. Please follow up with your primary care provider regarding your hospitalization.   Medications:  Please start taking: -Paxlovid (Nirmatrelvir 150 mg 2 tablets twice daily, Ritonavir 100 mg 1 tablet twice daily) for 3 days  Please stop taking: -*  Please continue taking: -Amlodipine 10 mg daily -Metoprolol 25 mg daily -Valsartan 80 mg daily -Lasix 20 mg daily -Nitroglycerin 0.4 mg -Aspirin 81 mg daily -Rosuvastatin 5 mg daily -Baclofen 10 mg daily -Dulaglutide 0.75 mg once weekly -Montelukast 10 mg daily -Albuterol inhaler -Omeprazole 20 mg daily -Latanoprost eyedrops -Timolol eye drops  Follow-up: - Please follow up with your primary care provider Teena Irani, PA-C (please call to schedule an appointment) Phone: [phone] Address: [address] [place], [place], [place] [address]

## 2022-07-15 NOTE — Progress Notes (Signed)
Subjective:   Patient is feeling okay this morning.  Still has cough and pleuritic chest pain with coughing.  He feels as though his breathing is doing well.  He still feels very weak and is anxious about how he will do at home if he leaves today.  Objective:  Vital signs in last 24 hours: Vitals:   07/14/22 1604 07/14/22 2042 07/14/22 2332 07/15/22 0316  BP: 101/65 (!) 111/59 98/63 100/65  Pulse: 87 96 (!) 56   Resp:  18 18 18   Temp:  97.8 F (36.6 C) (!) 97.4 F (36.3 C) 98 F (36.7 C)  TempSrc:  Oral Oral   SpO2: 97% 97% 98% 97%  Weight:      Height:       Physical Exam: Constitutional: resting in bed, not in acute distress, pleasant Cardio: Regular rate and rhythm. No murmurs, rubs, or gallops.  Pulmonary: Normal work of breathing on room air. No wheezes or crackles.  Abdomen: Normal bowel sounds. MSK: Extremities contracted Skin: Warm and dry. Neuro: AxO x3. Chronic deficits from cerebral palsy, immobile legs.  Assessment/Plan:  Principal Problem:   COVID-19 Active Problems:   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HCC)   OSA (obstructive sleep apnea)   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HCC)   Type 2 diabetes mellitus with complication, without long-term current use of insulin (HCC)   COVID-19 virus infection  Thomas Ramos is a 52 y.o. 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   # COVID-19 Started on paxlovid yesterday. Reviewed possible medication interactions with paxlovid. None of the patients medications interact with paxlovid except for amlodipine, which we are holding. Symptoms today are relatively unchanged from yesterday. Satting well on RA. Remains afebrile. Will start mucinex-DM for cough. Will schedule tylenol for pain. Given his severe comorbidities, and physical deconditioning, suspect  patient would benefit greatly from continued work w/ PT/OT. Plan to likely discharge tomorrow if patient appears to be improving.  - Supplemental oxygen as necessary - Schedule Tylenol 650 mg q6h  - Paxlovid for 5 days (day 2/5) - Mucinex DM BID - Albuterol q6h prn - Trend fever curve - PT/OT eval   # HFpEF EF 70-75% w/ G1DD in 07/2020. Follows w/ Dr. Jacques Navy. Initially held metoprolol, valsartan, lasix for low normal BP. MAPs in 70s overnight. HR intermittently in the 50s/60s over the last 24 hours. On exam, does not have signs of volume overload. Will restart home lasix today and metoprolol tomorrow given borderline low HR.  - Restart metoprolol 25 mg daily tomorrow - Restart lasix 20 mg daily   # HTN MAPs remain in the 70s. Will hold amlodipine for now given restarting lasix and metoprolol.  - Restart metoprolol 25 mg daily tomorrow - Restart lasix 20 mg daily - Hold amlodipine 10 mg daily - Trend BP   # T2DM A1C 6.3% in 03/2022. Taking dulaglutide 0.75 mg weekly at home. CBGs in the mid 100s-low 200s over the last 24 hours.  - Sensitive SSI   Diet: CM  Bowel: none VTE: lovenox IVF: none Code: full PT/OT recs: none LOS: day 2     Prior to Admission Living Arrangement: home w/ caretaker next-door Anticipated Discharge Location: TBD Barriers to Discharge: continued management Dispo: Anticipated discharge in approximately less than 2 day(s).   Karoline Caldwell, MD 07/15/2022, 6:23 AM Pager: [phone] After 5pm  on weekdays and 1pm on weekends: On Call pager [phone]

## 2022-07-15 NOTE — TOC Initial Note (Signed)
Transition of Care [facility]) - Initial/Assessment Note    Patient Details  Name: Thomas Ramos MRN: [id number] Date of Birth: 03-11-1971  Transition of Care [facility]) CM/SW Contact:    Janae Bridgeman, RN Phone Number: 07/15/2022, 2:11 PM  Clinical Narrative:                 CM met with the patient at the bedside regarding TOC needs.  The patient lives at home at [address], [place], [place], [place].  The patient states that he has a caregiver, Laurene Footman [phone] that lives next door that provides 24 hour care and assistance to the patient.  The patient will return home once medically stable for discharge and will need PTAR transport home.  The patient normally uses Electric wheelchair at the home and was transported to [facility] by ambulance for admission.  PTAR will be coordinated for the patient once patient is stable for discharge.  The patient states that his apartment is unlocked and that he will have access to the apartment once he is ready for discharge.  The patient is alert and oriented and able to answer questions and provide history without difficulty.  CM will continue to follow the patient for transportation coordination for discharge - pending medical stability to return home.  Expected Discharge Plan: Home/Self Care Barriers to Discharge: Continued Medical Work up   Patient Goals and CMS Choice Patient states their goals for this hospitalization and ongoing recovery are:: To return home CMS Medicare.gov Compare Post Acute Care list provided to:: Patient Choice offered to / list presented to : Patient [place] ownership interest in [facility].provided to:: Patient    Expected Discharge Plan and Services   Discharge Planning Services: CM Consult Post Acute Care Choice: Resumption of Svcs/PTA Provider Living arrangements for the past 2 months: Apartment                                      Prior Living  Arrangements/Services Living arrangements for the past 2 months: Apartment Lives with:: Other (Comment) (Patient has 24 hours caregiver - Laurene Footman [phone]) Patient language and need for interpreter reviewed:: Yes Do you feel safe going back to the place where you live?: Yes      Need for Family Participation in Patient Care: Yes (Comment) Care giver support system in place?: Yes (comment) Current home services: DME [facility] bed, [facility] lift, Mining engineer wheelchair - urinal at home) Criminal Activity/Legal Involvement Pertinent to Current Situation/Hospitalization: No - Comment as needed  Activities of Daily Living Home Assistive Devices/Equipment: Wheelchair, Nurse, adult ADL Screening (condition at time of admission) Patient's cognitive ability adequate to safely complete daily activities?: No Is the patient deaf or have difficulty hearing?: No Does the patient have difficulty seeing, even when wearing glasses/contacts?: No Does the patient have difficulty concentrating, remembering, or making decisions?: No Patient able to express need for assistance with ADLs?: Yes Does the patient have difficulty dressing or bathing?: Yes Independently performs ADLs?: No Communication: Independent Dressing (OT): Needs assistance Is this a change from baseline?: Pre-admission baseline Grooming: Needs assistance Is this a change from baseline?: Pre-admission baseline Feeding: Independent with device (comment) (needs helping setting food up) Bathing: Dependent Is this a change from baseline?: Pre-admission baseline Toileting: Dependent Is this a change from baseline?: Pre-admission baseline In/Out Bed: Dependent Is this a change from baseline?: Pre-admission baseline Walks in  Home: Dependent Is this a change from baseline?: Pre-admission baseline Does the patient have difficulty walking or climbing stairs?: Yes Weakness of Legs: Both Weakness of Arms/Hands: Both  Permission  Sought/Granted Permission sought to share information with : Family Supports Permission granted to share information with : Yes, Verbal Permission Granted        Permission granted to share info w Relationship: Laurene Footman - caregiver at the home - [phone]     Emotional Assessment Appearance:: Appears stated age Attitude/Demeanor/Rapport: Gracious Affect (typically observed): Accepting Orientation: : Oriented to Self, Oriented to Place, Oriented to  Time, Oriented to Situation Alcohol / Substance Use: Not Applicable Psych Involvement: No (comment)  Admission diagnosis:  COVID [U07.1] COVID-19 [U07.1] COVID-19 virus infection [U07.1] Patient Active Problem List   Diagnosis Date Noted   COVID-19 virus infection 07/14/2022   COVID 07/13/2022   Encounter for screening colonoscopy 06/23/2021   Wound of right foot 02/18/2021   Cough 09/20/2020   Heart murmur 02/26/2020   Pneumonia due to COVID-19 virus 03/21/2019   COVID-19 03/21/2019   Hiatal hernia    Cellulitis of right lower extremity without foot 08/03/2017   Type 2 diabetes mellitus with complication, without long-term current use of insulin (HCC) 08/03/2017   PNA (pneumonia) 05/07/2017   SIRS (systemic inflammatory response syndrome) (HCC)    Acute respiratory failure with hypoxia (HCC) 03/13/2017   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HCC)    Rhinovirus infection 08/17/2016   Pressure injury of skin 08/16/2016   Acute respiratory distress 08/15/2016   Tachycardia 08/15/2016   Sepsis (HCC) 08/15/2016   Onychomycosis 02/20/2016   Essential hypertension    OSA (obstructive sleep apnea) 12/11/2015   Chronic diastolic (congestive) heart failure (HCC)    Chronic venous insufficiency    Depression with anxiety 09/20/2015   CAP (community acquired pneumonia) 09/20/2015   Hypersomnia 07/16/2015   Asthma exacerbation 07/16/2015   GERD (gastroesophageal reflux disease) 07/16/2015   Snoring 07/16/2015   Hematemesis with  nausea    Fever 05/24/2014   Hyperlipidemia 02/22/2012   Dysphagia 09/16/2010   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HCC) 09/16/2010   PCP:  Teena Irani, PA-C Pharmacy:   [facility] [place], [place] - [address] [place] [address] Phone: [phone] Fax: [phone]  [facility] [address] [place] [address] Phone: [phone] Fax: [phone]     Social Determinants of Health (SDOH) Social History: SDOH Screenings   Food Insecurity: No Food Insecurity (07/14/2022)  Housing: Low Risk  (07/14/2022)  Transportation Needs: No Transportation Needs (07/14/2022)  Utilities: Not At Risk (07/14/2022)  Tobacco Use: Medium Risk (07/13/2022)   SDOH Interventions:     Readmission Risk Interventions    07/15/2022    2:11 PM  Readmission Risk Prevention Plan  Post Dischage Appt Complete  Medication Screening Complete  Transportation Screening Complete

## 2022-07-15 NOTE — Discharge Summary (Signed)
Name: Thomas Ramos MRN: [id number] DOB: [birth year] 52 y.o. PCP: Thomas Ramos  Date of Admission: 07/13/2022  7:29 PM Date of Discharge: 07/15/2022 Attending Physician: Thomas Ramos, *  Discharge Diagnosis: 1. Principal Problem:   COVID-19 Active Problems:   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HCC)   OSA (obstructive sleep apnea)   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HCC)   Type 2 diabetes mellitus with complication, without long-term current use of insulin (HCC)   COVID-19 virus infection   HFpEF  Discharge Medications: Allergies as of 07/16/2022       Reactions   Penicillins Hives, Nausea And Vomiting, Other (See Comments)   Patient tolerated cefazolin in 2017 Has patient had a PCN reaction causing immediate rash, facial/tongue/throat swelling, SOB or lightheadedness with hypotension: Yes Has patient had a PCN reaction causing severe rash involving mucus membranes or skin necrosis: No Has patient had a PCN reaction that required hospitalization No Has patient had a PCN reaction occurring within the last 10 years: Yes If all of the above answers are "NO", then may proceed with Cephalosporin use.   Sulfamethoxazole-trimethoprim Nausea And Vomiting   Metronidazole Nausea And Vomiting        Medication List     STOP taking these medications    amLODipine 10 MG tablet Commonly known as: NORVASC       TAKE these medications    aspirin 81 MG chewable tablet Chew 81 mg by mouth daily.   baclofen 10 MG tablet Commonly known as: LIORESAL Take 10 mg by mouth at bedtime.   Dulaglutide 0.75 MG/0.5ML Sopn Inject 0.75 mg into the skin once a week. Wednesday   furosemide 20 MG tablet Commonly known as: LASIX Take 20 mg by mouth daily.   latanoprost 0.005 % ophthalmic solution Commonly known as: XALATAN Place 1 drop into both eyes at bedtime.   metoprolol succinate 25 MG 24 hr tablet Commonly known as: TOPROL-XL Take 25 mg by mouth daily.   montelukast 10 MG  tablet Commonly known as: SINGULAIR Take 1 tablet by mouth daily.   nirmatrelvir/ritonavir 20 x 150 MG & 10 x 100MG  Tabs Commonly known as: PAXLOVID Take 3 tablets by mouth 2 (two) times daily for 2 days. Patient GFR is > 60. Take nirmatrelvir (150 mg) two tablets twice daily for 5 days and ritonavir (100 mg) one tablet twice daily for 5 days.   nitroGLYCERIN 0.4 MG SL tablet Commonly known as: NITROSTAT Place 0.4 mg under the tongue every 5 (five) minutes as needed for chest pain.   omeprazole 20 MG capsule Commonly known as: PRILOSEC Take 1 capsule by mouth every morning.   ProAir RespiClick 108 (90 Base) MCG/ACT Aepb Generic drug: Albuterol Sulfate Inhale 2 puffs into the lungs every 6 (six) hours as needed (Shortness of breath).   rosuvastatin 5 MG tablet Commonly known as: CRESTOR Take 5 mg by mouth at bedtime.   timolol 0.5 % ophthalmic solution Commonly known as: TIMOPTIC Place 1 drop into both eyes 2 (two) times daily.   valsartan 80 MG tablet Commonly known as: DIOVAN Take 80 mg by mouth daily.        Disposition and follow-up:   [name] was discharged from [facility] in Good condition.  At the hospital follow up visit please address:  1.    A. COVID-19   - Will finish last 3 days of paxlovid as outpatient   B. HFpEF   - Continued home metoprolol, valsartan, lasix  2.  Labs / imaging needed at time of follow-up: BMP  3.  Pending labs/ test needing follow-up: none  Follow-up Appointments: - Please follow up with your primary care provider Thomas Irani, PA-C (please call to schedule an appointment) Phone: [phone] Address: [address] [place], [place], [place] [address]  Hospital Course by problem list:  Thomas Ramos is a 52 y.o. male with pertinent PMH of HTN, T2DM, OSA, HFpEF, and quadriplegia 2/2 to cerebral palsy who presented with acute onset fever, nonproductive cough, dyspnea, and pleuritic chest pain and is  admitted for COVID-19 infection.   # COVID-19 Presented w/ acute onset cough, SOB, fever, pleuritic chest pain. Positive for COVID-19, no acute findings on CXR/CT PE study. Received IV dexamethasone, ceftriaxone, and azithromycin while in the ED. Remained afebrile without leukocytosis. Remained satting well on room air. Started on paxlovid given comorbidities. Will finish 2 days of Paxlovid as outpatient. Discussed holding rosuvastatin for 2 days while finishing course of Paxlovid.     # HFpEF Echo from 07/2020 w/ EF 70-75% w/ hyperdynamic LV, mild LVH, and G1DD. Follows w/ Dr. Jacques Ramos, last seen in 12/2021. Note at this time stated his metoprolol was being held due to fatigue. However, patient reports taking this medication regularly. Initially held home valsartan, lasix, and metoprolol due to low-normal BP. BP stabilized. Restarted valsartan, metoprolol, and lasix. Continued these medications at discharge. Will need to follow up with his cardiologist as outpatient.   # HTN Initially held home amlodipine, valsartan, lasix, and metoprolol due to low-normal BP. BP stabilized. Restarted valsartan, metoprolol, and lasix given history of heart failure. Continued these medications at discharge. Held amlodipine at discharge as we did not want to restart too many BP medications at one time due to risk of hypotension. Consider restarting amlodipine as outpatient if patient becomes hypertensive.    # T2DM A1C 6.3% in 03/2022. Previously on metformin/Jardiance. Currently using dulaglutide 0.75 mg weekly at home. CBGs were well controlled with sliding scale insulin. Discharged on home dulaglutide.     # AKI, resolved Baseline Cr ~ 0.7-0.9. Cr 1.1 on admission. Normalized throughout his hospitalization w/ encouraged PO intake.    # OSA Does not have CPAP at home. Did not want CPAP while inpatient. Consider reassessing if he would like a CPAP as outpatient.    Discharge Exam:   BP 120/77   Pulse 67   Temp  98.2 F (36.8 C) (Oral)   Resp 18   Ht 5\' 6"  (1.676 m)   Wt 81.6 kg   SpO2 97%   BMI 29.05 kg/m  Constitutional: resting in bed, not in acute distress, pleasant Cardio: Regular rate and rhythm. No murmurs, rubs, or gallops.  Pulmonary: Normal work of breathing on room air. No wheezes or crackles.  Abdomen: Soft, non-tender, non-distended, normal bowel sounds. MSK: Extremities contracted Skin: Warm and dry. Neuro: AxO x3. Chronic deficits from cerebral palsy, immobile legs.  Pertinent Labs, Studies, and Procedures:     Latest Ref Rng & Units 07/14/2022   12:03 AM 07/13/2022    7:53 PM 06/23/2021    4:55 PM  CBC  WBC 4.0 - 10.5 K/uL 5.1  6.8  7.3   Hemoglobin 13.0 - 17.0 g/dL 91.4  78.2  95.6   Hematocrit 39.0 - 52.0 % 34.8  44.5  43.7   Platelets 150 - 400 K/uL 152  217  213        Latest Ref Rng & Units 07/16/2022    8:17  AM 07/14/2022   12:03 AM 07/13/2022    7:53 PM  BMP  Glucose 70 - 99 mg/dL 161  096  045   BUN 6 - 20 mg/dL 14  15  15    Creatinine 0.61 - 1.24 mg/dL 4.09  8.11  9.14   Sodium 135 - 145 mmol/L 136  134  136   Potassium 3.5 - 5.1 mmol/L 3.7  3.7  4.0   Chloride 98 - 111 mmol/L 107  102  99   CO2 22 - 32 mmol/L 21  22  25    Calcium 8.9 - 10.3 mg/dL 8.5  8.3  9.3     DG Chest Port 1 View  IMPRESSION: Low lung volumes. No acute cardiopulmonary disease.   On: 07/13/2022 19:57  CT Angio Chest PE W and/or Wo Contrast  IMPRESSION: No evidence of pulmonary emboli.   Moderate to large hiatal hernia.   No other focal abnormality is seen.   On: 07/14/2022 00:00  Discharge Instructions: Discharge Instructions     Call MD for:  difficulty breathing, headache or visual disturbances   Complete by: As directed    Call MD for:  extreme fatigue   Complete by: As directed    Call MD for:  persistant dizziness or light-headedness   Complete by: As directed    Call MD for:  temperature >100.4   Complete by: As directed    Diet - low sodium heart healthy    Complete by: As directed    Increase activity slowly   Complete by: As directed       You were hospitalized for COVID.  Hospital Course: You tested positive for COVID. We suspect your fever, cough, shortness of breath, and chest pain are due to this infection. COVID is a virus that your body will fight off on its own. However, to help your body fight off the infection, we have started you on a medication called Paxlovid which you will take for 2 more days after discharge from the hospital. Please do not take Rosuvastatin for the 2 days you are taking Paxlovid. We are holding your amlodipine for now given your low-normal blood pressure. Please follow up with your primary care provider regarding your hospitalization and medication changes. Please follow up with cardiology as well.   Medications:  Please start taking: -Paxlovid (Nirmatrelvir 150 mg 2 tablets twice daily, Ritonavir 100 mg 1 tablet twice daily) for 2 days  Please stop taking: -Amlodipine 10 mg daily  Please continue taking: -Metoprolol 25 mg daily -Valsartan 80 mg daily -Lasix 20 mg daily -Nitroglycerin 0.4 mg -Aspirin 81 mg daily -Rosuvastatin 5 mg daily (do not take for the 2 days you are taking paxlovid) -Baclofen 10 mg daily -Dulaglutide 0.75 mg once weekly -Montelukast 10 mg daily -Albuterol inhaler -Omeprazole 20 mg daily -Latanoprost eyedrops -Timolol eye drops  Follow-up: - Please follow up with your primary care provider Thomas Irani, PA-C (please call to schedule an appointment) Phone: [phone] Address: [address] [place], [place], [place] [address]  - Please follow up with cardiology Parke Poisson, MD (please call to schedule an appointment) Phone: [phone] Address: [address], [place], [place] [address]   Signed: Karoline Caldwell, MD 07/16/2022, 1:16 PM   Pager: [phone]

## 2022-07-15 NOTE — Progress Notes (Signed)
OT Cancellation Note  Patient Details Name: Thomas Ramos MRN: [id number] DOB: 05/06/1970   Cancelled Treatment:    Reason Eval/Treat Not Completed: OT screened, no needs identified, will sign off (Pt is dependent at baseline with 24/7 care and plans to return home when medically able. OT offered to assist with hoyer lift to chair, pt declined. No OT needs, will sign off. Thank you.)  Donia Pounds 07/15/2022, 11:48 AM

## 2022-07-16 ENCOUNTER — Other Ambulatory Visit (HOSPITAL_COMMUNITY): Payer: Self-pay

## 2022-07-16 DIAGNOSIS — U071 COVID-19: Secondary | ICD-10-CM | POA: Diagnosis not present

## 2022-07-16 LAB — GLUCOSE, CAPILLARY
Glucose-Capillary: 138 mg/dL — ABNORMAL HIGH (ref 70–99)
Glucose-Capillary: 125 mg/dL — ABNORMAL HIGH (ref 70–99)
Glucose-Capillary: 110 mg/dL — ABNORMAL HIGH (ref 70–99)
Glucose-Capillary: 169 mg/dL — ABNORMAL HIGH (ref 70–99)

## 2022-07-16 LAB — BASIC METABOLIC PANEL
Anion gap: 8 (ref 5–15)
BUN: 14 mg/dL (ref 6–20)
CO2: 21 mmol/L — ABNORMAL LOW (ref 22–32)
Calcium: 8.5 mg/dL — ABNORMAL LOW (ref 8.9–10.3)
Chloride: 107 mmol/L (ref 98–111)
Creatinine, Ser: 0.9 mg/dL (ref 0.61–1.24)
GFR, Estimated: >60 mL/min (ref >60)
Glucose, Bld: 160 mg/dL — ABNORMAL HIGH (ref 70–99)
Potassium: 3.7 mmol/L (ref 3.5–5.1)
Sodium: 136 mmol/L (ref 135–145)

## 2022-07-16 MED ORDER — NIRMATRELVIR/RITONAVIR (PAXLOVID)TABLET
3.0000 | ORAL_TABLET | Freq: Two times a day (BID) | ORAL | 0 refills | Status: AC
  Filled 2022-07-16: qty 12, 2d supply, fill #0

## 2022-07-16 MED ORDER — IRBESARTAN 75 MG PO TABS
75.0000 mg | ORAL_TABLET | Freq: Every day | ORAL | Status: DC
  Administered 2022-07-16: 75 mg via ORAL
  Filled 2022-07-16: qty 1

## 2022-07-16 NOTE — TOC Progression Note (Signed)
Transition of Care [facility]) - Progression Note    Patient Details  Name: Thomas Ramos MRN: [id number] Date of Birth: 06-23-1970  Transition of [facility]) CM/SW Contact  Janae Bridgeman, RN Phone Number: 07/16/2022, 12:39 PM  Clinical Narrative:    Patient is medically stable to discharge to home today.  [facility] was called for transport home today at 4 pm.  [facility] was provided with patient's home address - [address] [place], [place], [place].   Expected Discharge Plan: Home/Self Care Barriers to Discharge: Continued Medical Work up  Expected Discharge Plan and Services   Discharge Planning Services: CM Consult Post Acute Care Choice: Resumption of Svcs/PTA Provider Living arrangements for the past 2 months: Apartment Expected Discharge Date: 07/15/22                                     Social Determinants of Health (SDOH) Interventions SDOH Screenings   Food Insecurity: No Food Insecurity (07/14/2022)  Housing: Low Risk  (07/14/2022)  Transportation Needs: No Transportation Needs (07/14/2022)  Utilities: Not At Risk (07/14/2022)  Tobacco Use: Medium Risk (07/13/2022)    Readmission Risk Interventions    07/15/2022    2:11 PM  Readmission Risk Prevention Plan  Post Dischage Appt Complete  Medication Screening Complete  Transportation Screening Complete

## 2022-07-16 NOTE — Plan of Care (Signed)

## 2022-07-16 NOTE — Progress Notes (Signed)
Patient's supply of Paxlovid on the Nursing Division will be provided to the patient at discharge with the remaining number of doses to be administered as will be shown on the discharge orders/discharge instructions.

## 2022-07-18 LAB — CULTURE, BLOOD (ROUTINE X 2)
Culture: NO GROWTH
Culture: NO GROWTH

## 2022-09-25 ENCOUNTER — Telehealth: Payer: Self-pay | Admitting: Gastroenterology

## 2022-09-25 NOTE — Telephone Encounter (Signed)
PT is calling to discuss symptoms he is having. Would not go into depth about the symptoms. Please advise.

## 2022-09-25 NOTE — Telephone Encounter (Signed)
Returned call to patient. Pt reports the same symptoms as mentioned in his last office visit. Pt also states that when he goes to urinate he passes some stool. I advised pt that this is likely the overflow incontinence that he experiences all of the time. Pt stated that he did not feel comfortable taking the half a capful of Miralax that Dr. Myrtie Neither recommended. Pt requested an appt to see Dr. Myrtie Neither in the afternoon. Pt has been scheduled for a f/u with Dr. Myrtie Neither on Wednesday, 12/30/22 at 1:40 pm. Pt verbalized understanding and had no concerns at the end of the call.

## 2022-11-05 ENCOUNTER — Telehealth: Payer: Self-pay | Admitting: Gastroenterology

## 2022-11-05 NOTE — Telephone Encounter (Signed)
Spoke with patient regarding symptoms. He is having frequent bowel movements, doesn't feel that he is emptying all the way. He was previously advised in office and in recent phone encounter to try 1/2 capful of miralax for symptoms. Pt will try this & call back with any other questions/concerns. Offered sooner appointment time with PA, but pt prefers to be seen with Dr. Myrtie Neither in October as scheduled.

## 2022-11-05 NOTE — Telephone Encounter (Signed)
Inbound call from patient stating he is having frequent bowel movements. Patient requesting a call back to discuss further and to be advised. Please advise, thank you.

## 2022-11-12 ENCOUNTER — Ambulatory Visit: Payer: Medicare Other | Admitting: Physician Assistant

## 2022-12-01 ENCOUNTER — Ambulatory Visit: Payer: Medicare Other | Admitting: Podiatry

## 2022-12-15 ENCOUNTER — Encounter: Payer: Self-pay | Admitting: Podiatry

## 2022-12-15 ENCOUNTER — Ambulatory Visit (INDEPENDENT_AMBULATORY_CARE_PROVIDER_SITE_OTHER): Payer: Medicare Other | Admitting: Podiatry

## 2022-12-15 DIAGNOSIS — Z91199 Patient's noncompliance with other medical treatment and regimen due to unspecified reason: Secondary | ICD-10-CM

## 2022-12-15 NOTE — Progress Notes (Signed)
1. No-show for appointment    Left without being seen due to excessive wait time.

## 2022-12-30 ENCOUNTER — Ambulatory Visit (INDEPENDENT_AMBULATORY_CARE_PROVIDER_SITE_OTHER): Payer: Medicare Other | Admitting: Gastroenterology

## 2022-12-30 VITALS — BP 128/84 | HR 88

## 2022-12-30 DIAGNOSIS — N39490 Overflow incontinence: Secondary | ICD-10-CM

## 2022-12-30 DIAGNOSIS — R159 Full incontinence of feces: Secondary | ICD-10-CM | POA: Diagnosis not present

## 2022-12-30 DIAGNOSIS — K449 Diaphragmatic hernia without obstruction or gangrene: Secondary | ICD-10-CM | POA: Diagnosis not present

## 2022-12-30 DIAGNOSIS — R1310 Dysphagia, unspecified: Secondary | ICD-10-CM

## 2022-12-30 DIAGNOSIS — Q399 Congenital malformation of esophagus, unspecified: Secondary | ICD-10-CM

## 2022-12-30 DIAGNOSIS — K5909 Other constipation: Secondary | ICD-10-CM

## 2022-12-30 DIAGNOSIS — R1319 Other dysphagia: Secondary | ICD-10-CM

## 2022-12-30 NOTE — Patient Instructions (Signed)
Please follow up as needed.   _______________________________________________________  If your blood pressure at your visit was 140/90 or greater, please contact your primary care physician to follow up on this.  _______________________________________________________  If you are age 51 or older, your body mass index should be between 23-30. Your There is no height or weight on file to calculate BMI. If this is out of the aforementioned range listed, please consider follow up with your Primary Care Provider.  If you are age 32 or younger, your body mass index should be between 19-25. Your There is no height or weight on file to calculate BMI. If this is out of the aformentioned range listed, please consider follow up with your Primary Care Provider.   ________________________________________________________  The [facility] providers would like to encourage you to use [facility] to communicate with providers for non-urgent requests or questions.  Due to long hold times on the telephone, sending your provider a message by [facility] may be a faster and more efficient way to get a response.  Please allow 48 business hours for a response.  Please remember that this is for non-urgent requests.  _______________________________________________________ It was a pleasure to see you today!  Thank you for trusting me with your gastrointestinal care!

## 2022-12-30 NOTE — Progress Notes (Deleted)
[facility] Consult Note:  History: Thomas Ramos 12/30/2022  Referring provider: Dani Gobble, PA-C  Reason for consult/chief complaint: No chief complaint on file.   Subjective  HPI: From my February 2024 office note: ""Thomas Ramos was seen in follow-up after recent inpatient procedures.  He has chronic dysphagia from esophageal tortuosity and hiatal hernia as well as diarrhea that seem most likely due to metformin or perhaps constipation and overflow.  Due to his CP and limited mobility he was admitted to the hospital for endoscopic testing on 06/24/2021. EGD revealed a large hiatal hernia (1/3-1/2 of the stomach intrathoracic) with distal esophageal tortuosity.  No fixed stricture or other mechanical lesion amenable to endoscopic dilation. Colonoscopy was a challenging procedure due to his body habitus and a redundant colon causing significant scope looping.  After 30 minutes of effort, the ileocecal valve was reached and the cecal base could not be seen due to scope looping and poor bowel prep.  Random colon biopsies negative for microscopic colitis, 4 subcentimeter tubular adenomas removed.  Left and right-sided diverticulosis found. My impression was that his diarrhea appeared to be most likely overflow from chronic constipation (CP, contracted body habitus, limited mobility, GLP-1 agonist), since he told me he had stopped taking metformin.   Thomas Ramos wanted to discuss his bowel habits and findings in the time of colonoscopy and endoscopy.  All these results were reviewed in detail and questions answered.  He agrees that he tends toward constipation but about every 10 to 14 days wants to take 1 or 2 Imodium tablets if he needs to travel to [place] or go to work without having worries about an episode of diarrhea and incontinence."   Additional colon Bx ruled out micro colitis ______________________   Clinical is here today with similar concerns as before.  He does  not have a BM every day, but intermittently will have "an accident" that may occur when he is out of the house or sometimes while sleeping.  He wondered if any of his medicines might be causing diarrhea.  He still takes Imodium sometimes if he is traveling out of the house he does not have an accident, and wanted to know if that was okay to do.  (See prior office visits for similar conversation).  I have reiterated that I believe the underlying problem is constipation due to slow motility and redundant colon anatomy.  I think he is having episodes of overflow incontinence, though it is difficult to tell if he entirely believes that.  I do not think any of his medicines are culprits, and he has had a colonoscopic workup as noted above. Previously recommended at least stool softeners, and would not advocate prescription laxatives as I think he may have worsening incontinence of the stool becomes loose. Can try low-dose MiraLAX such as half a capful a day.  With.  I do not think any further have to offer for this, which is understandably frustrating for him.  Permanent colostomy is a consideration (albeit a decidedly challenging surgery given his body habitus), but he has previously not had interest in surgery including feeding tube for his dysphagia." ______________________   ***   ROS:  Review of Systems   Past Medical History: Past Medical History:  Diagnosis Date   Anxiety    Asthma    Cellulitis 08/04/2017   Cerebral palsy (HCC)    Chronic diastolic (congestive) heart failure (HCC)    CP (cerebral palsy) (HCC)    Diabetes mellitus  without complication (HCC)    borderline   Drug induced constipation    Environmental allergies    takes inhalers if needed   Esophageal stricture    GERD (gastroesophageal reflux disease)    Hypertension    Motility disorder, esophageal    Pneumonia    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HCC)    S/P Botox injection    approx every 4 months   Seasonal  allergies      Past Surgical History: Past Surgical History:  Procedure Laterality Date   BIOPSY  06/24/2021   Procedure: BIOPSY;  Surgeon: Sherrilyn Rist, MD;  Location: [facility];  Service: Gastroenterology;;   BOTOX INJECTION N/A 12/21/2012   Procedure: BOTOX INJECTION;  Surgeon: Louis Meckel, MD;  Location: [facility];  Service: Endoscopy;  Laterality: N/A;   BOTOX INJECTION N/A 06/28/2014   Procedure: BOTOX INJECTION;  Surgeon: Louis Meckel, MD;  Location: [facility];  Service: Endoscopy;  Laterality: N/A;   COLONOSCOPY WITH PROPOFOL N/A 06/24/2021   Procedure: COLONOSCOPY WITH PROPOFOL;  Surgeon: Sherrilyn Rist, MD;  Location: [facility];  Service: Gastroenterology;  Laterality: N/A;   ESOPHAGOGASTRODUODENOSCOPY N/A 12/21/2012   Procedure: ESOPHAGOGASTRODUODENOSCOPY (EGD);  Surgeon: Louis Meckel, MD;  Location: [facility];  Service: Endoscopy;  Laterality: N/A;   ESOPHAGOGASTRODUODENOSCOPY N/A 06/28/2014   Procedure: ESOPHAGOGASTRODUODENOSCOPY (EGD);  Surgeon: Louis Meckel, MD;  Location: [facility] ENDOSCOPY;  Service: Endoscopy;  Laterality: N/A;   ESOPHAGOGASTRODUODENOSCOPY N/A 06/24/2021   Procedure: ESOPHAGOGASTRODUODENOSCOPY (EGD);  Surgeon: Sherrilyn Rist, MD;  Location: [facility];  Service: Gastroenterology;  Laterality: N/A;   ESOPHAGOGASTRODUODENOSCOPY (EGD) WITH PROPOFOL N/A 09/03/2017   Procedure: ESOPHAGOGASTRODUODENOSCOPY (EGD) WITH PROPOFOL;  Surgeon: Sherrilyn Rist, MD;  Location: [facility];  Service: Gastroenterology;  Laterality: N/A;   ESOPHAGUS SURGERY     stretched esophagus   legs     MOUTH SURGERY     POLYPECTOMY  06/24/2021   Procedure: POLYPECTOMY;  Surgeon: Sherrilyn Rist, MD;  Location: [facility];  Service: Gastroenterology;;   TENDON RELEASE       Family History: Family History  Problem Relation Age of Onset   Hypertension Father    Lung cancer Father    Diabetes Mother     Social History: Social History    Socioeconomic History   Marital status: Divorced    Spouse name: Not on file   Number of children: 0   Years of education: 6 th   Highest education level: Not on file  Occupational History   Occupation: Disabled  Tobacco Use   Smoking status: Never   Smokeless tobacco: Former   Tobacco comments:    smoked one time  Advertising account planner   Vaping status: Never Used  Substance and Sexual Activity   Alcohol use: Yes    Comment: 2 beers every other Friday and Saturday   Drug use: No   Sexual activity: Not on file  Other Topics Concern   Not on file  Social History Narrative   ** Merged History Encounter **       Patient lives in a Group [facility] - [phone]   Disabled.   Education- 6 th grade   Left handed.   Caffeine- coffee one cup daily.         Social Determinants of Health   Financial Resource Strain: Low Risk  (03/31/2022)   Received from [facility], [facility]   Overall Financial Resource Strain (CARDIA)  Difficulty of Paying Living Expenses: Not hard at all  Food Insecurity: No Food Insecurity (07/14/2022)   Hunger Vital Sign    Worried About Running Out of Food in the Last Year: Never true    Ran Out of Food in the Last Year: Never true  Transportation Needs: No Transportation Needs (07/14/2022)   PRAPARE - Administrator, Civil Service (Medical): No    Lack of Transportation (Non-Medical): No  Physical Activity: Unknown (03/31/2022)   Received from [facility], [facility]   Exercise Vital Sign    Days of Exercise per Week: 0 days    Minutes of Exercise per Session: Not on file  Stress: No Stress Concern Present (03/31/2022)   Received from [facility], [facility] - Occupational Stress Questionnaire    Feeling of Stress : Not at all  Social Connections: Moderately Integrated (03/31/2022)   Received from [facility], [facility]   Social Network    How would you rate your  social network (family, work, friends)?: Adequate participation with social networks    Allergies: Allergies  Allergen Reactions   Penicillins Hives, Nausea And Vomiting and Other (See Comments)    Patient tolerated cefazolin in 2017 Has patient had a PCN reaction causing immediate rash, facial/tongue/throat swelling, SOB or lightheadedness with hypotension: Yes Has patient had a PCN reaction causing severe rash involving mucus membranes or skin necrosis: No Has patient had a PCN reaction that required hospitalization No Has patient had a PCN reaction occurring within the last 10 years: Yes If all of the above answers are "NO", then may proceed with Cephalosporin use.   Sulfamethoxazole-Trimethoprim Nausea And Vomiting   Metronidazole Nausea And Vomiting    Outpatient Meds: Current Outpatient Medications  Medication Sig Dispense Refill   Albuterol Sulfate (PROAIR RESPICLICK) 108 (90 Base) MCG/ACT AEPB Inhale 2 puffs into the lungs every 6 (six) hours as needed (Shortness of breath).     aspirin 81 MG chewable tablet Chew 81 mg by mouth daily. (Patient not taking: Reported on 07/13/2022)     baclofen (LIORESAL) 10 MG tablet Take 10 mg by mouth at bedtime.     Dulaglutide 0.75 MG/0.5ML SOPN Inject 0.75 mg into the skin once a week. Wednesday     furosemide (LASIX) 20 MG tablet Take 20 mg by mouth daily.     latanoprost (XALATAN) 0.005 % ophthalmic solution Place 1 drop into both eyes at bedtime.     metoprolol succinate (TOPROL-XL) 25 MG 24 hr tablet Take 25 mg by mouth daily.     montelukast (SINGULAIR) 10 MG tablet Take 1 tablet by mouth daily.     nitroGLYCERIN (NITROSTAT) 0.4 MG SL tablet Place 0.4 mg under the tongue every 5 (five) minutes as needed for chest pain.  (Patient not taking: Reported on 07/13/2022)     omeprazole (PRILOSEC) 20 MG capsule Take 1 capsule by mouth every morning.     rosuvastatin (CRESTOR) 5 MG tablet Take 5 mg by mouth at bedtime.     timolol (TIMOPTIC) 0.5 %  ophthalmic solution Place 1 drop into both eyes 2 (two) times daily.     valsartan (DIOVAN) 80 MG tablet Take 80 mg by mouth daily.     No current facility-administered medications for this visit.      ___________________________________________________________________ Objective   Exam:  There were no vitals taken for this visit. Wt Readings from Last 3 Encounters:  07/13/22 180 lb (81.6 kg)  04/01/22 147 lb (66.7 kg)  12/16/21 147 lb (66.7 kg)    General: ***  Eyes: sclera anicteric, no redness ENT: oral mucosa moist without lesions, no cervical or supraclavicular lymphadenopathy CV: ***, no JVD, no peripheral edema Resp: clear to auscultation bilaterally, normal RR and effort noted GI: soft, *** tenderness, with active bowel sounds. No guarding or palpable organomegaly noted. Skin; warm and dry, no rash or jaundice noted Neuro: awake, alert and oriented x 3. Normal gross motor function and fluent speech  Labs:  ***  Radiologic Studies:  ***  Assessment: No diagnosis found.  ***  Plan:  ***  Thank you for the courtesy of this consult.  Please call me with any questions or concerns.  Charlie Pitter III  CC: Referring provider noted above

## 2022-12-30 NOTE — Progress Notes (Signed)
[facility] Consult Note:  History: Thomas Ramos 12/30/2022  Referring provider: Dani Gobble, PA-C  Reason for consult/chief complaint: Encopresis (Patient is having "accidents" and would like some suggestions or how to make this stop. Patient does not want to use Miralax as he thinks this will make things worse. ) and Constipation (Patient states constipation is better, and having regular BM's on most days.)   Subjective  HPI: From my February 2024 office note: ""Thomas Ramos was seen in follow-up after recent inpatient procedures.  He has chronic dysphagia from esophageal tortuosity and hiatal hernia as well as diarrhea that seem most likely due to metformin or perhaps constipation and overflow.  Due to his CP and limited mobility he was admitted to the hospital for endoscopic testing on 06/24/2021. EGD revealed a large hiatal hernia (1/3-1/2 of the stomach intrathoracic) with distal esophageal tortuosity.  No fixed stricture or other mechanical lesion amenable to endoscopic dilation. Colonoscopy was a challenging procedure due to his body habitus and a redundant colon causing significant scope looping.  After 30 minutes of effort, the ileocecal valve was reached and the cecal base could not be seen due to scope looping and poor bowel prep.  Random colon biopsies negative for microscopic colitis, 4 subcentimeter tubular adenomas removed.  Left and right-sided diverticulosis found. My impression was that his diarrhea appeared to be most likely overflow from chronic constipation (CP, contracted body habitus, limited mobility, GLP-1 agonist), since he told me he had stopped taking metformin.   Thomas Ramos wanted to discuss his bowel habits and findings in the time of colonoscopy and endoscopy.  All these results were reviewed in detail and questions answered.  He agrees that he tends toward constipation but about every 10 to 14 days wants to take 1 or 2 Imodium tablets if he  needs to travel to [place] or go to work without having worries about an episode of diarrhea and incontinence."   Additional colon Bx ruled out micro colitis ______________________   Clinical is here today with similar concerns as before.  He does not have a BM every day, but intermittently will have "an accident" that may occur when he is out of the house or sometimes while sleeping.  He wondered if any of his medicines might be causing diarrhea.  He still takes Imodium sometimes if he is traveling out of the house he does not have an accident, and wanted to know if that was okay to do.  (See prior office visits for similar conversation).  I have reiterated that I believe the underlying problem is constipation due to slow motility and redundant colon anatomy.  I think he is having episodes of overflow incontinence, though it is difficult to tell if he entirely believes that.  I do not think any of his medicines are culprits, and he has had a colonoscopic workup as noted above. Previously recommended at least stool softeners, and would not advocate prescription laxatives as I think he may have worsening incontinence of the stool becomes loose. Can try low-dose MiraLAX such as half a capful a day.  With.  I do not think any further have to offer for this, which is understandably frustrating for him.  Permanent colostomy is a consideration (albeit a decidedly challenging surgery given his body habitus), but he has previously not had interest in surgery including feeding tube for his dysphagia." ______________________  Today, he complains of experiencing several episodes of overflow fecal incontinence. He is unsure if Miralax is  going to relief his symptoms.  So it is not clear if he is taking it or how often, as he also asked what I meant by "low-dose".  He is wondering if undergoing a colonoscopy or hospital admission can help identify any further solutions or other underlying causes. He also states that  he is not interested in a colostomy or gastrostomy placement.  We discussed that undergoing a colonoscopy at this time will not provide any further information on his current symptoms as I explained that slow motility and redundant colon anatomy are the underlying cause of his constipation. He still has episodic dysphagia but says he is able to maintain his nutrition and weight.   ROS:  Denies chest pain, denies dyspnea.  Episodic cough   Past Medical History: 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HCC)    S/P Botox injection    approx every 4 months   Seasonal allergies      Past Surgical History: Past Surgical History:  Procedure Laterality Date   BIOPSY  06/24/2021   Procedure: BIOPSY;  Surgeon: Sherrilyn Rist, MD;  Location: [facility];  Service: Gastroenterology;;   BOTOX INJECTION N/A 12/21/2012   Procedure: BOTOX INJECTION;  Surgeon: Louis Meckel, MD;  Location: [facility];  Service: Endoscopy;  Laterality: N/A;   BOTOX INJECTION N/A 06/28/2014   Procedure: BOTOX INJECTION;  Surgeon: Louis Meckel, MD;  Location: [facility]COPY;  Service: Endoscopy;  Laterality: N/A;   COLONOSCOPY WITH PROPOFOL N/A 06/24/2021   Procedure: COLONOSCOPY WITH PROPOFOL;  Surgeon: Sherrilyn Rist, MD;  Location: [facility];  Service: Gastroenterology;  Laterality: N/A;   ESOPHAGOGASTRODUODENOSCOPY N/A 12/21/2012   Procedure: ESOPHAGOGASTRODUODENOSCOPY (EGD);  Surgeon: Louis Meckel, MD;  Location: [facility];  Service:  Endoscopy;  Laterality: N/A;   ESOPHAGOGASTRODUODENOSCOPY N/A 06/28/2014   Procedure: ESOPHAGOGASTRODUODENOSCOPY (EGD);  Surgeon: Louis Meckel, MD;  Location: [facility] ENDOSCOPY;  Service: Endoscopy;  Laterality: N/A;   ESOPHAGOGASTRODUODENOSCOPY N/A 06/24/2021   Procedure: ESOPHAGOGASTRODUODENOSCOPY (EGD);  Surgeon: Sherrilyn Rist, MD;  Location: [facility];  Service: Gastroenterology;  Laterality: N/A;   ESOPHAGOGASTRODUODENOSCOPY (EGD) WITH PROPOFOL N/A 09/03/2017   Procedure: ESOPHAGOGASTRODUODENOSCOPY (EGD) WITH PROPOFOL;  Surgeon: Sherrilyn Rist, MD;  Location: [facility];  Service: Gastroenterology;  Laterality: N/A;   ESOPHAGUS SURGERY     stretched esophagus   legs     MOUTH SURGERY     POLYPECTOMY  06/24/2021   Procedure: POLYPECTOMY;  Surgeon: Sherrilyn Rist, MD;  Location: [facility];  Service: Gastroenterology;;   TENDON RELEASE       Family History: Family History  Problem Relation Age of Onset   Hypertension Father    Lung cancer Father    Diabetes Mother     Social History: Social History   Socioeconomic History   Marital status: Divorced    Spouse name: Not on file   Number of children: 0   Years of education:  6 th   Highest education level: Not on file  Occupational History   Occupation: Disabled  Tobacco Use   Smoking status: Never   Smokeless tobacco: Former   Tobacco comments:    smoked one time  Vaping Use   Vaping status: Never Used  Substance and Sexual Activity   Alcohol use: Yes    Comment: 2 beers every other Friday and Saturday   Drug use: No   Sexual activity: Not on file  Other Topics Concern   Not on file  Social History Narrative   ** Merged History Encounter **       Patient lives in a Group Home - [facility].[phone]   Disabled.   Education- 6 th grade   Left handed.   Caffeine- coffee one cup daily.         Social Determinants of Health   Financial Resource Strain: Low Risk  (03/31/2022)   Received  from [facility], [facility]   Overall Financial Resource Strain (CARDIA)    Difficulty of Paying Living Expenses: Not hard at all  Food Insecurity: No Food Insecurity (07/14/2022)   Hunger Vital Sign    Worried About Running Out of Food in the Last Year: Never true    Ran Out of Food in the Last Year: Never true  Transportation Needs: No Transportation Needs (07/14/2022)   PRAPARE - Administrator, Civil Service (Medical): No    Lack of Transportation (Non-Medical): No  Physical Activity: Unknown (03/31/2022)   Received from [facility], [facility]   Exercise Vital Sign    Days of Exercise per Week: 0 days    Minutes of Exercise per Session: Not on file  Stress: No Stress Concern Present (03/31/2022)   Received from [facility], [facility] - Occupational Stress Questionnaire    Feeling of Stress : Not at all  Social Connections: Moderately Integrated (03/31/2022)   Received from [facility], [facility]   Social Network    How would you rate your social network (family, work, friends)?: Adequate participation with social networks    Allergies: Allergies  Allergen Reactions   Penicillins Hives, Nausea And Vomiting and Other (See Comments)    Patient tolerated cefazolin in 2017 Has patient had a PCN reaction causing immediate rash, facial/tongue/throat swelling, SOB or lightheadedness with hypotension: Yes Has patient had a PCN reaction causing severe rash involving mucus membranes or skin necrosis: No Has patient had a PCN reaction that required hospitalization No Has patient had a PCN reaction occurring within the last 10 years: Yes If all of the above answers are "NO", then may proceed with Cephalosporin use.   Sulfamethoxazole-Trimethoprim Nausea And Vomiting   Metronidazole Nausea And Vomiting    Outpatient Meds: Current Outpatient Medications  Medication Sig Dispense Refill   Albuterol Sulfate (PROAIR  RESPICLICK) 108 (90 Base) MCG/ACT AEPB Inhale 2 puffs into the lungs every 6 (six) hours as needed (Shortness of breath).     amLODipine (NORVASC) 10 MG tablet Take 10 mg by mouth daily.     aspirin 81 MG chewable tablet Chew 81 mg by mouth daily.     baclofen (LIORESAL) 10 MG tablet Take 10 mg by mouth at bedtime.     Dulaglutide 0.75 MG/0.5ML SOPN Inject 0.75 mg into the skin once a week. Wednesday     furosemide (LASIX) 20 MG tablet Take 20 mg by mouth daily.  latanoprost (XALATAN) 0.005 % ophthalmic solution Place 1 drop into both eyes at bedtime.     metoprolol succinate (TOPROL-XL) 25 MG 24 hr tablet Take 25 mg by mouth daily.     montelukast (SINGULAIR) 10 MG tablet Take 1 tablet by mouth daily.     nitroGLYCERIN (NITROSTAT) 0.4 MG SL tablet Place 0.4 mg under the tongue every 5 (five) minutes as needed for chest pain.     omeprazole (PRILOSEC) 20 MG capsule Take 1 capsule by mouth every morning.     rosuvastatin (CRESTOR) 5 MG tablet Take 5 mg by mouth at bedtime.     timolol (TIMOPTIC) 0.5 % ophthalmic solution Place 1 drop into both eyes 2 (two) times daily.     valsartan (DIOVAN) 80 MG tablet Take 80 mg by mouth daily.     No current facility-administered medications for this visit.      ___________________________________________________________________ Objective   Exam:  BP 128/84   Pulse 88   SpO2 99%  Wt Readings from Last 3 Encounters:  07/13/22 180 lb (81.6 kg)  04/01/22 147 lb (66.7 kg)  12/16/21 147 lb (66.7 kg)   Dysarthria, wheelchair-bound Spastic hemiplegia on the right Cardiac rhythm regular lungs clear bilaterally, decreased inspiratory volumes Limited abdominal exam in chair.  Soft, nontender   Labs:   Radiologic Studies:   Assessment: Chronic constipation  Overflow incontinence  Hiatal hernia  Esophageal dysphagia  Thomas Ramos has esophageal dysphagia from his large hiatal hernia and tortuous esophageal anatomy.  He has previously had  episodes of aspiration pneumonia as well, and he still does not want to be evaluated for consideration of hernia repair or gastrostomy placement.  Years of chronic constipation related to his CP and relative immobility.  Very challenging to manage because nearly anything we give him runs the chance of causing loose stool with episodic incontinence.  Not treating it runs the risk of overflow incontinence.  I again explained to him why I have been reluctant to use prescription medicines for constipation such as Linzess or Trulance due to the high incidence of loose stool from this.  As has occurred on previous visits, he understood my point of view and has decided to try the MiraLAX sometimes, perhaps on days when he is not working or otherwise leaving the house.  No other testing planned at this point.  This problem does not require hospitalization, and a repeat colonoscopy is not likely to be any more revealing.  Current hospital staffing does not allow elective admissions for bowel preparation before endoscopic procedures as well.  I will again be glad to see Thomas Ramos when needed should new issues arise or he wishes to discuss this further.   - Amada Jupiter, MD    [facility]  20 minutes were spent on this encounter (including chart review, history/exam, counseling/coordination of care, and documentation) > 50% of that time was spent on counseling and coordination of care.    I,Safa M Kadhim,acting as a scribe for Charlie Pitter III, MD.,have documented all relevant documentation on the behalf of Sherrilyn Rist, MD,as directed by  Sherrilyn Rist, MD while in the presence of Sherrilyn Rist, MD.   Marvis Repress III, MD, have reviewed all documentation for this visit. The documentation on 12/30/22 for the exam, diagnosis, procedures, and orders are all accurate and complete.    CC: Referring provider noted above

## 2023-01-25 ENCOUNTER — Emergency Department (HOSPITAL_COMMUNITY)
Admission: EM | Admit: 2023-01-25 | Discharge: 2023-01-25 | Disposition: A | Discharge status: Home / Self Care | Payer: Medicare Other | Attending: Emergency Medicine | Admitting: Emergency Medicine

## 2023-01-25 ENCOUNTER — Emergency Department (HOSPITAL_COMMUNITY): Payer: Medicare Other

## 2023-01-25 ENCOUNTER — Encounter (HOSPITAL_COMMUNITY): Payer: Self-pay

## 2023-01-25 ENCOUNTER — Other Ambulatory Visit: Payer: Self-pay

## 2023-01-25 DIAGNOSIS — R930 Abnormal findings on diagnostic imaging of skull and head, not elsewhere classified: Secondary | ICD-10-CM | POA: Diagnosis not present

## 2023-01-25 DIAGNOSIS — E119 Type 2 diabetes mellitus without complications: Secondary | ICD-10-CM | POA: Insufficient documentation

## 2023-01-25 DIAGNOSIS — Z87891 Personal history of nicotine dependence: Secondary | ICD-10-CM | POA: Diagnosis not present

## 2023-01-25 DIAGNOSIS — W108XXA Fall (on) (from) other stairs and steps, initial encounter: Secondary | ICD-10-CM | POA: Insufficient documentation

## 2023-01-25 DIAGNOSIS — J45901 Unspecified asthma with (acute) exacerbation: Secondary | ICD-10-CM | POA: Diagnosis not present

## 2023-01-25 DIAGNOSIS — S82302A Unspecified fracture of lower end of left tibia, initial encounter for closed fracture: Secondary | ICD-10-CM

## 2023-01-25 DIAGNOSIS — Z8616 Personal history of COVID-19: Secondary | ICD-10-CM | POA: Diagnosis not present

## 2023-01-25 DIAGNOSIS — S0031XA Abrasion of nose, initial encounter: Secondary | ICD-10-CM | POA: Insufficient documentation

## 2023-01-25 DIAGNOSIS — S60511A Abrasion of right hand, initial encounter: Secondary | ICD-10-CM | POA: Diagnosis not present

## 2023-01-25 DIAGNOSIS — I5032 Chronic diastolic (congestive) heart failure: Secondary | ICD-10-CM | POA: Diagnosis not present

## 2023-01-25 DIAGNOSIS — I110 Hypertensive heart disease with heart failure: Secondary | ICD-10-CM | POA: Insufficient documentation

## 2023-01-25 DIAGNOSIS — Z7982 Long term (current) use of aspirin: Secondary | ICD-10-CM | POA: Diagnosis not present

## 2023-01-25 DIAGNOSIS — S0993XA Unspecified injury of face, initial encounter: Secondary | ICD-10-CM | POA: Diagnosis present

## 2023-01-25 DIAGNOSIS — S00511A Abrasion of lip, initial encounter: Secondary | ICD-10-CM | POA: Diagnosis not present

## 2023-01-25 DIAGNOSIS — Z79899 Other long term (current) drug therapy: Secondary | ICD-10-CM | POA: Insufficient documentation

## 2023-01-25 MED ORDER — OXYCODONE-ACETAMINOPHEN 5-325 MG PO TABS: 1.0000 | ORAL_TABLET | Freq: Four times a day (QID) | ORAL | 0 refills | Status: DC | PRN

## 2023-01-25 MED ORDER — ACETAMINOPHEN 500 MG PO TABS
1000.0000 mg | ORAL_TABLET | Freq: Once | ORAL | Status: AC
  Administered 2023-01-25: 1000 mg via ORAL
  Filled 2023-01-25: qty 2

## 2023-01-25 MED ORDER — IBUPROFEN 400 MG PO TABS
600.0000 mg | ORAL_TABLET | Freq: Once | ORAL | Status: AC
  Administered 2023-01-25: 600 mg via ORAL
  Filled 2023-01-25: qty 1

## 2023-01-25 NOTE — Progress Notes (Signed)
Orthopedic Tech Progress Note Patient Details:  Thomas Ramos 10/25/1970 [id number]  Ortho Devices Type of Ortho Device: Post (short) splint Splint Material: Fiberglass Ortho Device/Splint Location: LLE Ortho Device/Splint Interventions: Application, Adjustment   Post Interventions Patient Tolerated: Well Instructions Provided: Care of device  Drake Wuertz A Bobbye Petti 01/25/2023, 4:44 PM

## 2023-01-25 NOTE — ED Provider Notes (Addendum)
Patient signed to me by Dr. Wallace Cullens pending results of his imaging.  Patient had splint placed per Dr. Gwenette Greet orders for patient's fracture of his distal tibia.  Patient is in a wheelchair and therefore is already nonweightbearing.  Patient will be given orthopedic referral for this.  Patient CT of his head and cervical spine did not show any acute fractures.  Plan will be for patient to be discharged home   Lorre Nick, MD 01/25/23 Windell Moment    Lorre Nick, MD 01/25/23 Windell Moment

## 2023-01-25 NOTE — ED Notes (Signed)
Pt has abrasion on bridge of nose and dried blood under nostrils. Pt has abrasions to posterior of right hand. Bleeding is controlled.

## 2023-01-25 NOTE — Discharge Instructions (Addendum)
It was a pleasure caring for you today in the emergency department. ° °Please return to the emergency department for any worsening or worrisome symptoms. ° ° °

## 2023-01-25 NOTE — ED Provider Notes (Signed)
[name] EMERGENCY DEPARTMENT AT [facility] Provider Note  CSN: [id number] Arrival date & time: 01/25/23 9518  Chief Complaint(s) Fall  HPI Thomas Ramos is a 52 y.o. male with past medical history as below, significant for several palsy, wheelchair dependent, DM, GERD, hypertension, quadriplegia who presents to the ED with complaint of fall  Patient reports just prior to arrival he was using his wheelchair, accidentally went down the stairs and the wheelchair fell on top of him.  He fell approximately 2 stairs.  No LOC.  No blood thinners.  Reports pain to his face and mouth and his forehead otherwise no other discomfort.  No LOC.  No chest pain abdominal pain.  No vomiting, no dyspnea.  No pain to extremities.  He is unsure of his last tetanus shot but does not want a tetanus shot today.  Past Medical History Past Medical History:  Diagnosis Date  . Anxiety   . Asthma   . Cellulitis 08/04/2017  . Cerebral palsy (HCC)   . Chronic diastolic (congestive) heart failure (HCC)   . CP (cerebral palsy) (HCC)   . Diabetes mellitus without complication (HCC)    borderline  . Drug induced constipation   . Environmental allergies    takes inhalers if needed  . Esophageal stricture   . GERD (gastroesophageal reflux disease)   . Hypertension   . Motility disorder, esophageal   . Pneumonia   . 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HCC)   . S/P Botox injection    approx every 4 months  . Seasonal allergies    Patient Active Problem List   Diagnosis Date Noted  . COVID-19 virus infection 07/14/2022  . COVID 07/13/2022  . Encounter for screening colonoscopy 06/23/2021  . Wound of right foot 02/18/2021  . Cough 09/20/2020  . Heart murmur 02/26/2020  . Pneumonia due to COVID-19 virus 03/21/2019  . COVID-19 03/21/2019  . Hiatal hernia   . Cellulitis of right lower extremity without foot 08/03/2017  . Type 2 diabetes mellitus with complication, without long-term current use of  insulin (HCC) 08/03/2017  . PNA (pneumonia) 05/07/2017  . SIRS (systemic inflammatory response syndrome) (HCC)   . Acute respiratory failure with hypoxia (HCC) 03/13/2017  . 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HCC)   . Rhinovirus infection 08/17/2016  . Pressure injury of skin 08/16/2016  . Acute respiratory distress 08/15/2016  . Tachycardia 08/15/2016  . Sepsis (HCC) 08/15/2016  . Onychomycosis 02/20/2016  . Essential hypertension   . OSA (obstructive sleep apnea) 12/11/2015  . Chronic diastolic (congestive) heart failure (HCC)   . Chronic venous insufficiency   . Depression with anxiety 09/20/2015  . CAP (community acquired pneumonia) 09/20/2015  . Hypersomnia 07/16/2015  . Asthma exacerbation 07/16/2015  . GERD (gastroesophageal reflux disease) 07/16/2015  . Snoring 07/16/2015  . Hematemesis with nausea   . Fever 05/24/2014  . Hyperlipidemia 02/22/2012  . Dysphagia 09/16/2010  . 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HCC) 09/16/2010   Home Medication(s) Prior to Admission medications   Medication Sig Start Date End Date Taking? Authorizing Provider  Albuterol Sulfate (PROAIR RESPICLICK) 108 (90 Base) MCG/ACT AEPB Inhale 2 puffs into the lungs every 6 (six) hours as needed (Shortness of breath). 05/03/20   [provider]  amLODipine (NORVASC) 10 MG tablet Take 10 mg by mouth daily. 12/07/22   [provider]  aspirin 81 MG chewable tablet Chew 81 mg by mouth daily.    [provider]  baclofen (LIORESAL) 10 MG tablet Take 10 mg by mouth  at bedtime. 06/01/18   [provider]  Dulaglutide 0.75 MG/0.5ML SOPN Inject 0.75 mg into the skin once a week. Wednesday 02/26/20   [provider]  furosemide (LASIX) 20 MG tablet Take 20 mg by mouth daily. 10/23/20   [provider]  latanoprost (XALATAN) 0.005 % ophthalmic solution Place 1 drop into both eyes at bedtime. 08/25/21   [provider]  metoprolol succinate (TOPROL-XL) 25 MG 24 hr  tablet Take 25 mg by mouth daily. 02/13/21   [provider]  montelukast (SINGULAIR) 10 MG tablet Take 1 tablet by mouth daily. 07/28/21   [provider]  nitroGLYCERIN (NITROSTAT) 0.4 MG SL tablet Place 0.4 mg under the tongue every 5 (five) minutes as needed for chest pain.    [provider]  omeprazole (PRILOSEC) 20 MG capsule Take 1 capsule by mouth every morning. 12/12/19   [provider]  rosuvastatin (CRESTOR) 5 MG tablet Take 5 mg by mouth at bedtime.    [provider]  timolol (TIMOPTIC) 0.5 % ophthalmic solution Place 1 drop into both eyes 2 (two) times daily. 04/10/20   [provider]  valsartan (DIOVAN) 80 MG tablet Take 80 mg by mouth daily.    [provider]                                                                                                                                    Past Surgical History Past Surgical History:  Procedure Laterality Date  . BIOPSY  06/24/2021   Procedure: BIOPSY;  Surgeon: Sherrilyn Rist, MD;  Location: [facility];  Service: Gastroenterology;;  . Haynes Hoehn INJECTION N/A 12/21/2012   Procedure: BOTOX INJECTION;  Surgeon: Louis Meckel, MD;  Location: [facility] ENDOSCOPY;  Service: Endoscopy;  Laterality: N/A;  . BOTOX INJECTION N/A 06/28/2014   Procedure: BOTOX INJECTION;  Surgeon: Louis Meckel, MD;  Location: [facility] ENDOSCOPY;  Service: Endoscopy;  Laterality: N/A;  . COLONOSCOPY WITH PROPOFOL N/A 06/24/2021   Procedure: COLONOSCOPY WITH PROPOFOL;  Surgeon: Sherrilyn Rist, MD;  Location: [facility] ENDOSCOPY;  Service: Gastroenterology;  Laterality: N/A;  . ESOPHAGOGASTRODUODENOSCOPY N/A 12/21/2012   Procedure: ESOPHAGOGASTRODUODENOSCOPY (EGD);  Surgeon: Louis Meckel, MD;  Location: [facility];  Service: Endoscopy;  Laterality: N/A;  . ESOPHAGOGASTRODUODENOSCOPY N/A 06/28/2014   Procedure: ESOPHAGOGASTRODUODENOSCOPY (EGD);  Surgeon: Louis Meckel, MD;  Location: [facility] ENDOSCOPY;  Service:  Endoscopy;  Laterality: N/A;  . ESOPHAGOGASTRODUODENOSCOPY N/A 06/24/2021   Procedure: ESOPHAGOGASTRODUODENOSCOPY (EGD);  Surgeon: Sherrilyn Rist, MD;  Location: [facility];  Service: Gastroenterology;  Laterality: N/A;  . ESOPHAGOGASTRODUODENOSCOPY (EGD) WITH PROPOFOL N/A 09/03/2017   Procedure: ESOPHAGOGASTRODUODENOSCOPY (EGD) WITH PROPOFOL;  Surgeon: Sherrilyn Rist, MD;  Location: [facility] ENDOSCOPY;  Service: Gastroenterology;  Laterality: N/A;  . ESOPHAGUS SURGERY     stretched esophagus  . legs    . MOUTH SURGERY    . POLYPECTOMY  06/24/2021   Procedure: POLYPECTOMY;  Surgeon: Sherrilyn Rist, MD;  Location: [facility];  Service: Gastroenterology;;  . TENDON RELEASE     Family History Family History  Problem Relation Age of Onset  . Hypertension Father   . Lung cancer Father   . Diabetes Mother     Social History Social History   Tobacco Use  . Smoking status: Never  . Smokeless tobacco: Former  . Tobacco comments:    smoked one time  Vaping Use  . Vaping status: Never Used  Substance Use Topics  . Alcohol use: Yes    Comment: 2 beers every other Friday and Saturday  . Drug use: No   Allergies Penicillins, Sulfamethoxazole-trimethoprim, and Metronidazole  Review of Systems Review of Systems  Constitutional:  Negative for chills and fever.  Respiratory:  Negative for chest tightness and shortness of breath.   Cardiovascular:  Negative for chest pain.  Gastrointestinal:  Negative for abdominal pain and vomiting.  Musculoskeletal:  Negative for back pain.  Skin:  Positive for wound.  Neurological:  Negative for headaches.  All other systems reviewed and are negative.   Physical Exam Vital Signs  I have reviewed the triage vital signs BP 114/70   Pulse 77   Temp 98.7 F (37.1 C) (Oral)   Resp 17   Ht 5\' 2"  (1.575 m)   Wt 90.7 kg   SpO2 100%   BMI 36.58 kg/m  Physical Exam Vitals and nursing note reviewed.  Constitutional:      General: He is  not in acute distress.    Appearance: He is well-developed. He is not ill-appearing.  HENT:     Head: Normocephalic. Abrasion present. No right periorbital erythema or left periorbital erythema.     Jaw: There is normal jaw occlusion.     Comments: Abrasion upper lip Abrasion nasal bridge    Right Ear: External ear normal.     Left Ear: External ear normal.     Nose:     Right Nostril: No septal hematoma.     Left Nostril: No septal hematoma.     Mouth/Throat:     Mouth: Mucous membranes are moist.     Dentition: Normal dentition.     Comments: No obvious tongue laceration or dental injury Eyes:     General: No scleral icterus.    Extraocular Movements: Extraocular movements intact.     Pupils: Pupils are equal, round, and reactive to light.  Cardiovascular:     Rate and Rhythm: Normal rate and regular rhythm.     Pulses: Normal pulses.     Heart sounds: Normal heart sounds.  Pulmonary:     Effort: Pulmonary effort is normal. No respiratory distress.     Breath sounds: Normal breath sounds.  Abdominal:     General: Abdomen is flat.     Palpations: Abdomen is soft.     Tenderness: There is no abdominal tenderness. There is no guarding or rebound.  Musculoskeletal:       Hands:     Cervical back: No rigidity.     Right lower leg: No edema.     Left lower leg: No edema.  Skin:    General: Skin is warm and dry.     Capillary Refill: Capillary refill takes less than 2 seconds.  Neurological:     Mental Status: He is alert and oriented to person, place, and time.     GCS: GCS eye subscore is 4. GCS verbal  subscore is 5. GCS motor subscore is 6.     Cranial Nerves: No dysarthria or facial asymmetry.     Sensory: No sensory deficit.     Motor: No tremor.     Comments: Quadriplegia  Gait testing deferred as pt wheelchair bound    Psychiatric:        Mood and Affect: Mood normal.        Behavior: Behavior normal.     ED Results and Treatments Labs (all labs ordered are  listed, but only abnormal results are displayed) Labs Reviewed - No data to display                                                                                                                        Radiology CT Maxillofacial Wo Contrast  Result Date: 01/25/2023 CLINICAL DATA:  Trauma, fall, head injury EXAM: CT HEAD WITHOUT CONTRAST CT MAXILLOFACIAL WITHOUT CONTRAST CT CERVICAL SPINE WITHOUT CONTRAST TECHNIQUE: Multidetector CT imaging of the head, cervical spine, and maxillofacial structures were performed using the standard protocol without intravenous contrast. Multiplanar CT image reconstructions of the cervical spine and maxillofacial structures were also generated. RADIATION DOSE REDUCTION: This exam was performed according to the departmental dose-optimization program which includes automated exposure control, adjustment of the mA and/or kV according to patient size and/or use of iterative reconstruction technique. COMPARISON:  None Available. FINDINGS: CT HEAD FINDINGS Brain: No evidence of acute infarction, hemorrhage, extra-axial collection or mass lesion/mass effect. Global cerebral volume loss and ex vacuo enlargement of the left-greater-than-right lateral ventricles. Vascular: No hyperdense vessel or unexpected calcification. CT FACIAL BONES FINDINGS Skull: Normal. Negative for fracture or focal lesion. Facial bones: No displaced fractures or dislocations. Sinuses/Orbits: No acute finding. Other: None. CT CERVICAL SPINE FINDINGS Alignment: Normal. Skull base and vertebrae: No acute fracture. No primary bone lesion or focal pathologic process. Soft tissues and spinal canal: No prevertebral fluid or swelling. No visible canal hematoma. Disc levels: Moderate disc space height loss and osteophytosis throughout the mid to lower cervical spine. Upper chest: Negative. Other: None. IMPRESSION: 1. No acute intracranial pathology. Global cerebral volume loss and ex vacuo enlargement of the  left-greater-than-right lateral ventricles. 2. No displaced fractures or dislocations of the facial bones. 3. No fracture or static subluxation of the cervical spine. 4. Moderate disc space height loss and osteophytosis throughout the mid to lower cervical spine. Electronically Signed   By: Jearld Lesch M.D.   On: 01/25/2023 13:25   CT Head Wo Contrast  Result Date: 01/25/2023 CLINICAL DATA:  Trauma, fall, head injury EXAM: CT HEAD WITHOUT CONTRAST CT MAXILLOFACIAL WITHOUT CONTRAST CT CERVICAL SPINE WITHOUT CONTRAST TECHNIQUE: Multidetector CT imaging of the head, cervical spine, and maxillofacial structures were performed using the standard protocol without intravenous contrast. Multiplanar CT image reconstructions of the cervical spine and maxillofacial structures were also generated. RADIATION DOSE REDUCTION: This exam was performed according to the departmental dose-optimization program which includes automated exposure control, adjustment of the mA and/or  kV according to patient size and/or use of iterative reconstruction technique. COMPARISON:  None Available. FINDINGS: CT HEAD FINDINGS Brain: No evidence of acute infarction, hemorrhage, extra-axial collection or mass lesion/mass effect. Global cerebral volume loss and ex vacuo enlargement of the left-greater-than-right lateral ventricles. Vascular: No hyperdense vessel or unexpected calcification. CT FACIAL BONES FINDINGS Skull: Normal. Negative for fracture or focal lesion. Facial bones: No displaced fractures or dislocations. Sinuses/Orbits: No acute finding. Other: None. CT CERVICAL SPINE FINDINGS Alignment: Normal. Skull base and vertebrae: No acute fracture. No primary bone lesion or focal pathologic process. Soft tissues and spinal canal: No prevertebral fluid or swelling. No visible canal hematoma. Disc levels: Moderate disc space height loss and osteophytosis throughout the mid to lower cervical spine. Upper chest: Negative. Other: None.  IMPRESSION: 1. No acute intracranial pathology. Global cerebral volume loss and ex vacuo enlargement of the left-greater-than-right lateral ventricles. 2. No displaced fractures or dislocations of the facial bones. 3. No fracture or static subluxation of the cervical spine. 4. Moderate disc space height loss and osteophytosis throughout the mid to lower cervical spine. Electronically Signed   By: Jearld Lesch M.D.   On: 01/25/2023 13:25   CT Cervical Spine Wo Contrast  Result Date: 01/25/2023 CLINICAL DATA:  Trauma, fall, head injury EXAM: CT HEAD WITHOUT CONTRAST CT MAXILLOFACIAL WITHOUT CONTRAST CT CERVICAL SPINE WITHOUT CONTRAST TECHNIQUE: Multidetector CT imaging of the head, cervical spine, and maxillofacial structures were performed using the standard protocol without intravenous contrast. Multiplanar CT image reconstructions of the cervical spine and maxillofacial structures were also generated. RADIATION DOSE REDUCTION: This exam was performed according to the departmental dose-optimization program which includes automated exposure control, adjustment of the mA and/or kV according to patient size and/or use of iterative reconstruction technique. COMPARISON:  None Available. FINDINGS: CT HEAD FINDINGS Brain: No evidence of acute infarction, hemorrhage, extra-axial collection or mass lesion/mass effect. Global cerebral volume loss and ex vacuo enlargement of the left-greater-than-right lateral ventricles. Vascular: No hyperdense vessel or unexpected calcification. CT FACIAL BONES FINDINGS Skull: Normal. Negative for fracture or focal lesion. Facial bones: No displaced fractures or dislocations. Sinuses/Orbits: No acute finding. Other: None. CT CERVICAL SPINE FINDINGS Alignment: Normal. Skull base and vertebrae: No acute fracture. No primary bone lesion or focal pathologic process. Soft tissues and spinal canal: No prevertebral fluid or swelling. No visible canal hematoma. Disc levels: Moderate disc space  height loss and osteophytosis throughout the mid to lower cervical spine. Upper chest: Negative. Other: None. IMPRESSION: 1. No acute intracranial pathology. Global cerebral volume loss and ex vacuo enlargement of the left-greater-than-right lateral ventricles. 2. No displaced fractures or dislocations of the facial bones. 3. No fracture or static subluxation of the cervical spine. 4. Moderate disc space height loss and osteophytosis throughout the mid to lower cervical spine. Electronically Signed   By: Jearld Lesch M.D.   On: 01/25/2023 13:25   DG Hand Complete Right  Result Date: 01/25/2023 CLINICAL DATA:  Fall.  Hand chronically contracted. EXAM: RIGHT HAND - COMPLETE 3+ VIEW COMPARISON:  None Available. FINDINGS: A single lateral view of the hand is provided. There is flexion of the second and third metacarpophalangeal joints. There appears to be a swan-neck deformity of the fifth finger. There is extension positioning of the thumb interphalangeal joint. Overlapping bones and single view limit evaluation. Mild second and third DIP joint space narrowing. No acute fracture or dislocation is seen. No cortical erosion is seen. IMPRESSION: 1. Limited single lateral view with flexion of the  second and third metacarpophalangeal joints. 2. Swan-neck deformity of the fifth finger. 3. Extension positioning of the thumb interphalangeal joint. 4. No acute fracture is seen. Electronically Signed   By: Neita Garnet M.D.   On: 01/25/2023 12:01    Pertinent labs & imaging results that were available during my care of the patient were reviewed by me and considered in my medical decision making (see MDM for details).  Medications Ordered in ED Medications - No data to display                                                                                                                                   Procedures Procedures  (including critical care time)  Medical Decision Making / ED Course    Medical  Decision Making:    Thomas Ramos is a 52 y.o. male with past medical history as below, significant for several palsy, wheelchair dependent, DM, GERD, hypertension, quadriplegia who presents to the ED with complaint of fall. The complaint involves an extensive differential diagnosis and also carries with it a high risk of complications and morbidity.  Serious etiology was considered. Ddx includes but is not limited to: Differential diagnoses for head trauma includes subdural hematoma, epidural hematoma, acute concussion, traumatic subarachnoid hemorrhage, cerebral contusions, etc.   Complete initial physical exam performed, notably the patient  was no acute distress, neuroexam is stable.    Reviewed and confirmed nursing documentation for past medical history, family history, social history.  Vital signs reviewed.    Clinical Course as of 01/25/23 1505  Mon Jan 25, 2023  1034 Patient refused tetanus shot [SG]  1504 On wet read tib fib xr appears to have distal tib fx, minimally displaced  [SG]    Clinical Course User Index [SG] Tanda Rockers A, DO     Patient fell 2 steps in a wheelchair falling top of him, has history of several palsy.  No thinners.  Will get CT imaging of face head and C-spine.  X-ray of right hand.  CT imaging negative, hand x-ray negative.  Foot x-ray left with possible distal tibia fracture.  Will get tib-fib.  Awaiting formal interpretation of foot x-ray and tib-fib x-ray.  LE NVI.  He is wheelchair dependent at baseline  Care signed out to incoming team at shift change.                 Additional history obtained: -Additional history obtained from na -External records from outside source obtained and reviewed including: Chart review including previous notes, labs, imaging, consultation notes including  Medications Primary care documentation Prior admission   Lab Tests: Not applicable EKG   EKG Interpretation Date/Time:    Ventricular Rate:     PR Interval:    QRS Duration:    QT Interval:    QTC Calculation:   R Axis:      Text Interpretation:  Imaging Studies ordered: I ordered imaging studies including CT head, CT face, CT cervical, hand x-ray right I independently visualized the following imaging with scope of interpretation limited to determining acute life threatening conditions related to emergency care; findings noted above I independently visualized and interpreted imaging. I agree with the radiologist interpretation   Medicines ordered and prescription drug management: No orders of the defined types were placed in this encounter.   -I have reviewed the patients home medicines and have made adjustments as needed   Consultations Obtained: na   Cardiac Monitoring: Continuous pulse oximetry interpreted by myself, 98% on room air   Social Determinants of Health:  Diagnosis or treatment significantly limited by social determinants of health: Wheelchair dependent, cerebral palsy, former smoker   Reevaluation: After the interventions noted above, I reevaluated the patient and found that they have improved  Co morbidities that complicate the patient evaluation . Past Medical History:  Diagnosis Date  . Anxiety   . Asthma   . Cellulitis 08/04/2017  . Cerebral palsy (HCC)   . Chronic diastolic (congestive) heart failure (HCC)   . CP (cerebral palsy) (HCC)   . Diabetes mellitus without complication (HCC)    borderline  . Drug induced constipation   . Environmental allergies    takes inhalers if needed  . Esophageal stricture   . GERD (gastroesophageal reflux disease)   . Hypertension   . Motility disorder, esophageal   . Pneumonia   . 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HCC)   . S/P Botox injection    approx every 4 months  . Seasonal allergies       Dispostion: Disposition decision including need for hospitalization was considered, and patient disposition pending at time of sign  out.    Final Clinical Impression(s) / ED Diagnoses Final diagnoses:  Fall down stairs, initial encounter        Sloan Leiter, DO 01/25/23 1449

## 2023-01-25 NOTE — ED Notes (Signed)
[facility] here for patient transport.

## 2023-01-25 NOTE — ED Notes (Signed)
Assumed care of patient, NAD noted, pt waiting for [facility] for transport home, pt denies any needs at this time.

## 2023-01-25 NOTE — ED Triage Notes (Signed)
Pt was in his electric wheelchair and went down two steps. Pt fell forward on his face and his electric wheelchair fell on top of him. Pt is not on blood thinners. Denies neck pain. AOx4      Hx of cerebral palsy   EMS VS 150/90 90 HR 96% RA 213 CBG

## 2023-03-03 ENCOUNTER — Ambulatory Visit: Payer: Medicare Other | Admitting: Podiatry

## 2023-04-20 ENCOUNTER — Encounter (HOSPITAL_COMMUNITY): Payer: Self-pay

## 2023-04-20 ENCOUNTER — Emergency Department (HOSPITAL_COMMUNITY): Payer: Medicare Other

## 2023-04-20 ENCOUNTER — Inpatient Hospital Stay (HOSPITAL_COMMUNITY)
Admission: EM | Admit: 2023-04-20 | Discharge: 2023-04-23 | DRG: 602 | Disposition: A | Discharge status: Home / Self Care | Payer: Medicare Other | Attending: Internal Medicine | Admitting: Internal Medicine

## 2023-04-20 ENCOUNTER — Other Ambulatory Visit: Payer: Self-pay

## 2023-04-20 DIAGNOSIS — Z7985 Long-term (current) use of injectable non-insulin antidiabetic drugs: Secondary | ICD-10-CM | POA: Diagnosis not present

## 2023-04-20 DIAGNOSIS — D61818 Other pancytopenia: Secondary | ICD-10-CM | POA: Diagnosis not present

## 2023-04-20 DIAGNOSIS — I110 Hypertensive heart disease with heart failure: Secondary | ICD-10-CM | POA: Diagnosis present

## 2023-04-20 DIAGNOSIS — Z794 Long term (current) use of insulin: Secondary | ICD-10-CM | POA: Diagnosis not present

## 2023-04-20 DIAGNOSIS — Z7982 Long term (current) use of aspirin: Secondary | ICD-10-CM | POA: Diagnosis not present

## 2023-04-20 DIAGNOSIS — E118 Type 2 diabetes mellitus with unspecified complications: Secondary | ICD-10-CM | POA: Diagnosis present

## 2023-04-20 DIAGNOSIS — E876 Hypokalemia: Secondary | ICD-10-CM | POA: Diagnosis present

## 2023-04-20 DIAGNOSIS — G809 Cerebral palsy, unspecified: Secondary | ICD-10-CM | POA: Diagnosis present

## 2023-04-20 DIAGNOSIS — J45909 Unspecified asthma, uncomplicated: Secondary | ICD-10-CM | POA: Diagnosis present

## 2023-04-20 DIAGNOSIS — E669 Obesity, unspecified: Secondary | ICD-10-CM | POA: Diagnosis present

## 2023-04-20 DIAGNOSIS — Z801 Family history of malignant neoplasm of trachea, bronchus and lung: Secondary | ICD-10-CM

## 2023-04-20 DIAGNOSIS — Z880 Allergy status to penicillin: Secondary | ICD-10-CM | POA: Diagnosis not present

## 2023-04-20 DIAGNOSIS — G800 Spastic quadriplegic cerebral palsy: Secondary | ICD-10-CM | POA: Diagnosis present

## 2023-04-20 DIAGNOSIS — E119 Type 2 diabetes mellitus without complications: Secondary | ICD-10-CM | POA: Diagnosis present

## 2023-04-20 DIAGNOSIS — Z833 Family history of diabetes mellitus: Secondary | ICD-10-CM | POA: Diagnosis not present

## 2023-04-20 DIAGNOSIS — D649 Anemia, unspecified: Secondary | ICD-10-CM | POA: Diagnosis not present

## 2023-04-20 DIAGNOSIS — M7989 Other specified soft tissue disorders: Secondary | ICD-10-CM | POA: Diagnosis not present

## 2023-04-20 DIAGNOSIS — I10 Essential (primary) hypertension: Secondary | ICD-10-CM | POA: Diagnosis present

## 2023-04-20 DIAGNOSIS — Z888 Allergy status to other drugs, medicaments and biological substances status: Secondary | ICD-10-CM

## 2023-04-20 DIAGNOSIS — Z6834 Body mass index (BMI) 34.0-34.9, adult: Secondary | ICD-10-CM | POA: Diagnosis not present

## 2023-04-20 DIAGNOSIS — Z87891 Personal history of nicotine dependence: Secondary | ICD-10-CM | POA: Diagnosis not present

## 2023-04-20 DIAGNOSIS — K219 Gastro-esophageal reflux disease without esophagitis: Secondary | ICD-10-CM | POA: Diagnosis present

## 2023-04-20 DIAGNOSIS — Z882 Allergy status to sulfonamides status: Secondary | ICD-10-CM | POA: Diagnosis not present

## 2023-04-20 DIAGNOSIS — L03116 Cellulitis of left lower limb: Secondary | ICD-10-CM | POA: Diagnosis present

## 2023-04-20 DIAGNOSIS — Z8249 Family history of ischemic heart disease and other diseases of the circulatory system: Secondary | ICD-10-CM

## 2023-04-20 DIAGNOSIS — F419 Anxiety disorder, unspecified: Secondary | ICD-10-CM | POA: Diagnosis present

## 2023-04-20 DIAGNOSIS — Z79899 Other long term (current) drug therapy: Secondary | ICD-10-CM | POA: Diagnosis not present

## 2023-04-20 DIAGNOSIS — E785 Hyperlipidemia, unspecified: Secondary | ICD-10-CM | POA: Diagnosis present

## 2023-04-20 DIAGNOSIS — G8250 Quadriplegia, unspecified: Secondary | ICD-10-CM | POA: Diagnosis not present

## 2023-04-20 DIAGNOSIS — I5032 Chronic diastolic (congestive) heart failure: Secondary | ICD-10-CM | POA: Diagnosis present

## 2023-04-20 LAB — CBC WITH DIFFERENTIAL/PLATELET
Abs Immature Granulocytes: 0.02 10*3/uL (ref 0.00–0.07)
Basophils Absolute: 0 10*3/uL (ref 0.0–0.1)
Basophils Relative: 0 %
Eosinophils Absolute: 0.1 10*3/uL (ref 0.0–0.5)
Eosinophils Relative: 1 %
HCT: 37.6 % — ABNORMAL LOW (ref 39.0–52.0)
Hemoglobin: 12.6 g/dL — ABNORMAL LOW (ref 13.0–17.0)
Immature Granulocytes: 0 %
Lymphocytes Relative: 22 %
Lymphs Abs: 1.2 10*3/uL (ref 0.7–4.0)
MCH: 28.2 pg (ref 26.0–34.0)
MCHC: 33.5 g/dL (ref 30.0–36.0)
MCV: 84.1 fL (ref 80.0–100.0)
Monocytes Absolute: 0.3 10*3/uL (ref 0.1–1.0)
Monocytes Relative: 6 %
Neutro Abs: 3.6 10*3/uL (ref 1.7–7.7)
Neutrophils Relative %: 71 %
Platelets: 163 10*3/uL (ref 150–400)
RBC: 4.47 MIL/uL (ref 4.22–5.81)
RDW: 12.3 % (ref 11.5–15.5)
WBC: 5.2 10*3/uL (ref 4.0–10.5)
nRBC: 0 % (ref 0.0–0.2)

## 2023-04-20 LAB — COMPREHENSIVE METABOLIC PANEL
ALT: 8 U/L (ref 0–44)
AST: 11 U/L — ABNORMAL LOW (ref 15–41)
Albumin: 3.5 g/dL (ref 3.5–5.0)
Alkaline Phosphatase: 84 U/L (ref 38–126)
Anion gap: 12 (ref 5–15)
BUN: 15 mg/dL (ref 6–20)
CO2: 25 mmol/L (ref 22–32)
Calcium: 9 mg/dL (ref 8.9–10.3)
Chloride: 103 mmol/L (ref 98–111)
Creatinine, Ser: 0.92 mg/dL (ref 0.61–1.24)
GFR, Estimated: >60 mL/min (ref >60)
Glucose, Bld: 133 mg/dL — ABNORMAL HIGH (ref 70–99)
Potassium: 3.2 mmol/L — ABNORMAL LOW (ref 3.5–5.1)
Sodium: 140 mmol/L (ref 135–145)
Total Bilirubin: 0.2 mg/dL (ref 0.0–1.2)
Total Protein: 6.1 g/dL — ABNORMAL LOW (ref 6.5–8.1)

## 2023-04-20 MED ORDER — INSULIN ASPART 100 UNIT/ML IJ SOLN: 0.0000 [IU] | Freq: Every day | INTRAMUSCULAR | Status: DC

## 2023-04-20 MED ORDER — ONDANSETRON HCL 4 MG/2ML IJ SOLN: 4.0000 mg | Freq: Four times a day (QID) | INTRAMUSCULAR | Status: DC | PRN

## 2023-04-20 MED ORDER — OXYCODONE HCL 5 MG PO TABS
5.0000 mg | ORAL_TABLET | ORAL | Status: DC | PRN
  Administered 2023-04-21 (×2): 5 mg via ORAL
  Filled 2023-04-20 (×2): qty 1

## 2023-04-20 MED ORDER — ALBUTEROL SULFATE (2.5 MG/3ML) 0.083% IN NEBU: 2.5000 mg | INHALATION_SOLUTION | Freq: Four times a day (QID) | RESPIRATORY_TRACT | Status: DC | PRN

## 2023-04-20 MED ORDER — LATANOPROST 0.005 % OP SOLN
1.0000 [drp] | Freq: Every day | OPHTHALMIC | Status: DC
  Administered 2023-04-21 – 2023-04-22 (×2): 1 [drp] via OPHTHALMIC
  Filled 2023-04-20: qty 2.5

## 2023-04-20 MED ORDER — ONDANSETRON HCL 4 MG PO TABS: 4.0000 mg | ORAL_TABLET | Freq: Four times a day (QID) | ORAL | Status: DC | PRN

## 2023-04-20 MED ORDER — AMLODIPINE BESYLATE 10 MG PO TABS
10.0000 mg | ORAL_TABLET | Freq: Every day | ORAL | Status: DC
  Filled 2023-04-20: qty 2

## 2023-04-20 MED ORDER — FUROSEMIDE 20 MG PO TABS
20.0000 mg | ORAL_TABLET | Freq: Every day | ORAL | Status: DC
  Administered 2023-04-21 – 2023-04-23 (×3): 20 mg via ORAL
  Filled 2023-04-20 (×3): qty 1

## 2023-04-20 MED ORDER — SODIUM CHLORIDE 0.9 % IV SOLN: 2.0000 g | Freq: Once | INTRAVENOUS | Status: DC

## 2023-04-20 MED ORDER — POTASSIUM CHLORIDE CRYS ER 20 MEQ PO TBCR
40.0000 meq | EXTENDED_RELEASE_TABLET | Freq: Once | ORAL | Status: AC
  Administered 2023-04-21: 40 meq via ORAL
  Filled 2023-04-20: qty 2

## 2023-04-20 MED ORDER — ROSUVASTATIN CALCIUM 5 MG PO TABS
5.0000 mg | ORAL_TABLET | Freq: Every day | ORAL | Status: DC
  Administered 2023-04-21 – 2023-04-23 (×4): 5 mg via ORAL
  Filled 2023-04-20 (×4): qty 1

## 2023-04-20 MED ORDER — PANTOPRAZOLE SODIUM 40 MG PO TBEC
40.0000 mg | DELAYED_RELEASE_TABLET | Freq: Every day | ORAL | Status: DC
  Administered 2023-04-21 – 2023-04-23 (×3): 40 mg via ORAL
  Filled 2023-04-20 (×3): qty 1

## 2023-04-20 MED ORDER — INSULIN ASPART 100 UNIT/ML IJ SOLN
0.0000 [IU] | Freq: Three times a day (TID) | INTRAMUSCULAR | Status: DC
  Administered 2023-04-22 – 2023-04-23 (×3): 1 [IU] via SUBCUTANEOUS

## 2023-04-20 MED ORDER — TIMOLOL MALEATE 0.5 % OP SOLN
1.0000 [drp] | Freq: Two times a day (BID) | OPHTHALMIC | Status: DC
  Administered 2023-04-21 – 2023-04-23 (×4): 1 [drp] via OPHTHALMIC
  Filled 2023-04-20: qty 5

## 2023-04-20 MED ORDER — BACLOFEN 10 MG PO TABS
10.0000 mg | ORAL_TABLET | Freq: Every day | ORAL | Status: DC
  Administered 2023-04-21 – 2023-04-23 (×4): 10 mg via ORAL
  Filled 2023-04-20 (×4): qty 1

## 2023-04-20 MED ORDER — MONTELUKAST SODIUM 10 MG PO TABS
10.0000 mg | ORAL_TABLET | Freq: Every day | ORAL | Status: DC
  Administered 2023-04-21 – 2023-04-23 (×3): 10 mg via ORAL
  Filled 2023-04-20 (×3): qty 1

## 2023-04-20 MED ORDER — IRBESARTAN 75 MG PO TABS
75.0000 mg | ORAL_TABLET | Freq: Every day | ORAL | Status: DC
  Administered 2023-04-21: 75 mg via ORAL
  Filled 2023-04-20 (×2): qty 1

## 2023-04-20 MED ORDER — SODIUM CHLORIDE 0.9 % IV SOLN
2.0000 g | INTRAVENOUS | Status: DC
  Administered 2023-04-21 – 2023-04-23 (×2): 2 g via INTRAVENOUS
  Filled 2023-04-20 (×3): qty 20

## 2023-04-20 MED ORDER — ASPIRIN 81 MG PO CHEW
81.0000 mg | CHEWABLE_TABLET | Freq: Every day | ORAL | Status: DC
Start: 2023-04-21 — End: 2023-04-24
  Administered 2023-04-21 – 2023-04-23 (×3): 81 mg via ORAL
  Filled 2023-04-20 (×3): qty 1

## 2023-04-20 MED ORDER — POLYETHYLENE GLYCOL 3350 17 G PO PACK: 17.0000 g | PACK | Freq: Every day | ORAL | Status: DC | PRN

## 2023-04-20 MED ORDER — METOPROLOL SUCCINATE ER 25 MG PO TB24
25.0000 mg | ORAL_TABLET | Freq: Every day | ORAL | Status: DC
  Filled 2023-04-20: qty 1

## 2023-04-20 MED ORDER — ACETAMINOPHEN 325 MG PO TABS: 650.0000 mg | ORAL_TABLET | Freq: Four times a day (QID) | ORAL | Status: DC | PRN

## 2023-04-20 MED ORDER — ACETAMINOPHEN 650 MG RE SUPP: 650.0000 mg | Freq: Four times a day (QID) | RECTAL | Status: DC | PRN

## 2023-04-20 MED ORDER — ENOXAPARIN SODIUM 40 MG/0.4ML IJ SOSY
40.0000 mg | PREFILLED_SYRINGE | INTRAMUSCULAR | Status: DC
  Administered 2023-04-21 – 2023-04-22 (×3): 40 mg via SUBCUTANEOUS
  Filled 2023-04-20 (×3): qty 0.4

## 2023-04-20 NOTE — Progress Notes (Signed)
Limited LLE venous duplex was completed. Preliminary results given to Arabella Merles, PA-C.    Results can be found under chart review under CV PROC. 04/20/2023 7:21 PM Umer Harig RVT, RDMS

## 2023-04-20 NOTE — ED Provider Triage Note (Signed)
 Emergency Medicine Provider Triage Evaluation Note  Thomas Ramos , a 53 y.o. male  was evaluated in triage.  Pt complains of  2 weeks of LLE edema. No fevers or chills. No chest pain or shortness of breath. No injuries to the area  Review of Systems  Positive: As above Negative: As above  Physical Exam  BP 102/63   Pulse 91   Temp (!) 97.4 F (36.3 C) (Oral)   Resp 16   Ht 5' 4 (1.626 m)   Wt 90.7 kg   SpO2 96%   BMI 34.33 kg/m  Gen:   Awake, no distress   Resp:  Normal effort  MSK:   Moves extremities without difficulty  Other:  LLE with edema and overlying ecchymosis, no wounds or abrasions. Does not appear cellulitic  Medical Decision Making  Medically screening exam initiated at 6:16 PM.  Appropriate orders placed.  Thomas Ramos was informed that the remainder of the evaluation will be completed by another provider, this initial triage assessment does not replace that evaluation, and the importance of remaining in the ED until their evaluation is complete.     Veta Palma, PA-C 04/20/23 1818

## 2023-04-20 NOTE — H&P (Signed)
 History and Physical    Thomas Ramos FMW:[id number] DOB: May 29, 1970 DOA: 04/20/2023  PCP: Jacques Camie Pepper, PA-C   Patient coming from: Home  Chief Complaint: Left leg swelling and redness   HPI: Thomas Ramos is a 53 y.o. male with medical history significant for hypertension, hyperlipidemia, type 2 diabetes mellitus, chronic HFpEF, cerebral palsy, and spastic quadriplegia who presents with left leg swelling and redness.  Patient complains of redness and swelling involving his lower left leg that began roughly 2 weeks ago and continues to worsen.  He denies any fever or chills.  Patient reports experiencing similar symptoms previously multiple times that have usually failed outpatient treatment.  ED Course: Upon arrival to the ED, patient is found to be afebrile and saturating well on room air with stable blood pressure.  Labs are most notable for normal creatinine, potassium 3.2, and normal WBC.  Patient was treated with Rocephin  and oral potassium in the ED.  Review of Systems:  All other systems reviewed and apart from HPI, are negative.  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HCC)    S/P Botox  injection    approx every 4 months   Seasonal allergies     Past Surgical History:  Procedure Laterality Date   BIOPSY  06/24/2021   Procedure: BIOPSY;  Surgeon: Legrand Victory LITTIE DOUGLAS, MD;  Location: [facility];  Service: Gastroenterology;;   BOTOX  INJECTION N/A 12/21/2012   Procedure: BOTOX  INJECTION;  Surgeon: Lamar JONETTA Aho, MD;  Location: [facility];  Service: Endoscopy;  Laterality: N/A;   BOTOX  INJECTION N/A 06/28/2014   Procedure: BOTOX  INJECTION;  Surgeon: Lamar JONETTA Aho, MD;  Location: [facility] END[facility]COPY;  Service: Endoscopy;  Laterality: N/A;   COLONOSCOPY WITH PROPOFOL  N/A 06/24/2021   Procedure: COLONOSCOPY WITH PROPOFOL ;  Surgeon: Legrand Victory LITTIE DOUGLAS, MD;  Location: [facility];  Service: Gastroenterology;  Laterality: N/A;   ESOPHAGOGASTRODUODENOSCOPY N/A 12/21/2012   Procedure: ESOPHAGOGASTRODUODENOSCOPY (EGD);  Surgeon: Lamar JONETTA Aho, MD;  Location: [facility];  Service: Endoscopy;  Laterality: N/A;   ESOPHAGOGASTRODUODENOSCOPY N/A 06/28/2014   Procedure: ESOPHAGOGASTRODUODENOSCOPY (EGD);  Surgeon: Lamar JONETTA Aho, MD;  Location: [facility] END[facility]COPY;  Service: Endoscopy;  Laterality: N/A;   ESOPHAGOGASTRODUODENOSCOPY N/A 06/24/2021   Procedure: ESOPHAGOGASTRODUODENOSCOPY (EGD);  Surgeon: Legrand Victory LITTIE DOUGLAS, MD;  Location: [facility];  Service: Gastroenterology;  Laterality: N/A;   ESOPHAGOGASTRODUODENOSCOPY (EGD) WITH PROPOFOL  N/A 09/03/2017   Procedure: ESOPHAGOGASTRODUODENOSCOPY (EGD) WITH PROPOFOL ;  Surgeon: Legrand Victory LITTIE DOUGLAS, MD;  Location: [facility];  Service: Gastroenterology;  Laterality: N/A;   ESOPHAGUS SURGERY     stretched esophagus   legs     MOUTH SURGERY     POLYPECTOMY  06/24/2021   Procedure: POLYPECTOMY;  Surgeon: Legrand Victory LITTIE DOUGLAS, MD;  Location: [facility];  Service: Gastroenterology;;   TENDON RELEASE      Social History:   reports that he has never smoked. He has quit using smokeless tobacco. He reports current alcohol  use. He reports that he does not use drugs.  Allergies  Allergen Reactions   Penicillins Hives, Nausea And Vomiting and Other (See Comments)    Patient tolerated cefazolin  in 2017 Has patient had a PCN reaction causing immediate rash, facial/tongue/throat swelling, SOB or lightheadedness with hypotension: Yes Has patient had a PCN reaction causing severe  rash involving mucus membranes or skin necrosis: No Has patient had a PCN reaction that required hospitalization No Has patient had a PCN reaction occurring within the last 10 years: Yes If all of the above answers are NO, then may proceed with Cephalosporin use.   Sulfamethoxazole -Trimethoprim  Nausea And Vomiting   Metronidazole  Nausea And Vomiting    Family History  Problem Relation Age of Onset   Hypertension Father    Lung cancer Father    Diabetes Mother      Prior to Admission medications   Medication Sig Start Date End Date Taking? Authorizing Provider  Albuterol  Sulfate (PROAIR  RESPICLICK) 108 (90 Base) MCG/ACT AEPB Inhale 2 puffs into the lungs every 6 (six) hours as needed (Shortness of breath). 05/03/20   [provider]  amLODipine  (NORVASC ) 10 MG tablet Take 10 mg by mouth daily. 12/07/22   [provider]  aspirin  81 MG chewable tablet Chew 81 mg by mouth daily.    [provider]  baclofen  (LIORESAL ) 10 MG tablet Take 10 mg by mouth at bedtime. 06/01/18   [provider]  Dulaglutide 0.75 MG/0.5ML SOPN Inject 0.75 mg into the skin once a week. Wednesday 02/26/20   [provider]  furosemide  (LASIX ) 20 MG tablet Take 20 mg by mouth daily. 10/23/20   [provider]  latanoprost  (XALATAN ) 0.005 % ophthalmic solution Place 1 drop into both eyes at bedtime. 08/25/21   [provider]  metoprolol  succinate (TOPROL -XL) 25 MG 24 hr tablet Take 25 mg by mouth daily. 02/13/21   [provider]  montelukast  (SINGULAIR ) 10 MG tablet Take 1 tablet by mouth daily. 07/28/21   [provider]  nitroGLYCERIN  (NITROSTAT ) 0.4 MG SL tablet Place 0.4 mg under the tongue every 5 (five) minutes as needed for chest pain.    [provider]  omeprazole  (PRILOSEC) 20 MG capsule Take 1 capsule by mouth every morning. 12/12/19   [provider]  oxyCODONE -acetaminophen  (PERCOCET/ROXICET) 5-325 MG tablet  Take 1 tablet by mouth every 6 (six) hours as needed for severe pain (pain score 7-10). 01/25/23   Dasie Faden, MD  rosuvastatin  (CRESTOR ) 5 MG tablet Take 5 mg by mouth at bedtime.    [provider]  timolol  (TIMOPTIC ) 0.5 % ophthalmic solution Place 1 drop into both eyes 2 (two) times daily. 04/10/20   [provider]  valsartan (DIOVAN) 80 MG tablet Take 80 mg by mouth daily.    [provider]    Physical Exam: Vitals:   04/20/23 1559 04/20/23 1600 04/20/23 2310  BP: 102/63  (!) 103/57  Pulse: 91  (!) 58  Resp: 16  18  Temp: (!) 97.4 F (36.3 C)  (!) 97.4 F (36.3 C)  TempSrc: Oral  Oral  SpO2: 96%  100%  Weight:  90.7 kg   Height:  5' 4 (1.626 m)     Constitutional: NAD, calm  Eyes: PERTLA, lids and conjunctivae normal ENMT: Mucous membranes are moist. Posterior pharynx clear of any exudate or lesions.   Neck: supple, no masses  Respiratory: no wheezing, no crackles. No accessory muscle use.  Cardiovascular: S1 & S2 heard,  regular rate and rhythm. No JVD. Abdomen: No distension, no tenderness, soft. Bowel sounds active.  Musculoskeletal: no clubbing / cyanosis. Contractures.   Skin: Erythema, heat, and edema involving lower LLE. Warm, dry, well-perfused. Neurologic: Quadriplegic. Alert and oriented.  Psychiatric: Pleasant. Cooperative.    Labs and Imaging on Admission: I have personally reviewed following labs and imaging studies  CBC: Recent Labs  Lab 04/20/23 1825  WBC 5.2  NEUTROABS 3.6  HGB 12.6*  HCT 37.6*  MCV 84.1  PLT 163   Basic Metabolic Panel: Recent Labs  Lab 04/20/23 1825  NA 140  K 3.2*  CL 103  CO2 25  GLUCOSE 133*  BUN 15  CREATININE 0.92  CALCIUM  9.0   GFR: Estimated Creatinine Clearance: 97.6 mL/min (by C-G formula based on SCr of 0.92 mg/dL). Liver Function Tests: Recent Labs  Lab 04/20/23 1825  AST 11*  ALT 8  ALKPHOS 84  BILITOT 0.2  PROT 6.1*  ALBUMIN 3.5   No results for input(s):  LIPASE, AMYLASE in the last 168 hours. No results for input(s): AMMONIA in the last 168 hours. Coagulation Profile: No results for input(s): INR, PROTIME in the last 168 hours. Cardiac Enzymes: No results for input(s): CKTOTAL, CKMB, CKMBINDEX, TROPONINI in the last 168 hours. BNP (last 3 results) No results for input(s): PROBNP in the last 8760 hours. HbA1C: No results for input(s): HGBA1C in the last 72 hours. CBG: No results for input(s): GLUCAP in the last 168 hours. Lipid Profile: No results for input(s): CHOL, HDL, LDLCALC, TRIG, CHOLHDL, LDLDIRECT in the last 72 hours. Thyroid Function Tests: No results for input(s): TSH, T4TOTAL, FREET4, T3FREE, THYROIDAB in the last 72 hours. Anemia Panel: No results for input(s): VITAMINB12, FOLATE, FERRITIN, TIBC, IRON, RETICCTPCT in the last 72 hours. Urine analysis:    Component Value Date/Time   COLORURINE YELLOW 03/21/2019 2013   APPEARANCEUR CLEAR 03/21/2019 2013   LABSPEC 1.028 03/21/2019 2013   PHURINE 5.0 03/21/2019 2013   GLUCOSEU >=500 (A) 03/21/2019 2013   HGBUR NEGATIVE 03/21/2019 2013   BILIRUBINUR NEGATIVE 03/21/2019 2013   KETONESUR 5 (A) 03/21/2019 2013   PROTEINUR NEGATIVE 03/21/2019 2013   UROBILINOGEN 1.0 05/26/2014 2211   NITRITE NEGATIVE 03/21/2019 2013   LEUKOCYTESUR NEGATIVE 03/21/2019 2013   Sepsis Labs: @LABRCNTIP (procalcitonin:4,lacticidven:4) )No results found for this or any previous visit (from the past 240 hours).   Radiological Exams on Admission: VAS US  LOWER EXTREMITY VENOUS (DVT) (7a-7p) Result Date: 04/20/2023  Lower Venous DVT Study Patient Name:  Edrees C Hing  Date of Exam:   04/20/2023 Medical Rec #: [id number]      Accession #:    [id number] Date of Birth: 10/10/1970       Patient Gender: M Patient Age:   57 years Exam Location:  [facility] Procedure:      VAS US  LOWER EXTREMITY VENOUS (DVT) Referring Phys: ALAN HARARI  --------------------------------------------------------------------------------  Indications: Erythema, and Swelling.  Limitations: Patient with MS (severely contracted) and poor ultrasound/tissue interface. Comparison Study: Patient has had several previous exams, all undiagnostic due                   to above stated limitations. Performing Technologist: Ezzie Potters RVT, RDMS  Examination Guidelines: A complete evaluation includes B-mode imaging, spectral Doppler, color Doppler, and power Doppler as needed of all accessible portions of each vessel. Bilateral testing is considered an integral part of a complete examination. Limited examinations for reoccurring indications may be performed as noted. The  reflux portion of the exam is performed with the patient in reverse Trendelenburg.  +-----+---------------+---------+-----------+----------+----------------------+ RIGHTCompressibilityPhasicitySpontaneityPropertiesThrombus Aging         +-----+---------------+---------+-----------+----------+----------------------+ CFV                 Yes      Yes                  patent by color and                                                      doppler                +-----+---------------+---------+-----------+----------+----------------------+   +--------+---------------+---------+-----------+----------+--------------------+ LEFT    CompressibilityPhasicitySpontaneityPropertiesThrombus Aging       +--------+---------------+---------+-----------+----------+--------------------+ CFV                    Yes      Yes                  patent by                                                                 color/doppler        +--------+---------------+---------+-----------+----------+--------------------+ SFJ                                                  unable to visualized +--------+---------------+---------+-----------+----------+--------------------+ FV Prox                                               unable to visualized +--------+---------------+---------+-----------+----------+--------------------+ FV Mid                 Yes      Yes                  patent by                                                                 color/doppler        +--------+---------------+---------+-----------+----------+--------------------+ FV                                                   unable to visualized Distal                                                                    +--------+---------------+---------+-----------+----------+--------------------+  PFV                                                  unable to visualized +--------+---------------+---------+-----------+----------+--------------------+ POP     Full           Yes      Yes                                       +--------+---------------+---------+-----------+----------+--------------------+ PTV                                                  unable to visualized +--------+---------------+---------+-----------+----------+--------------------+ PERO                                                 unable to visualized +--------+---------------+---------+-----------+----------+--------------------+    Summary: RIGHT: - No evidence of common femoral vein obstruction.  LEFT: - Mostly inconclusive study due to above stated limitations. Limited visualization, no evidence of DVT in areas imaged. - No cystic structure found in the popliteal fossa.  *See table(s) above for measurements and observations.    Preliminary     Assessment/Plan   1. Left leg cellulitis  - Continue Rocephin , follow clinical response, obtain imaging if worsens of fails to improve as expected    2. Hypertension  - Norvasc , metoprolol , valsartan    3. Chronic HFpEF  - Appears compensated   - Continue Lasix , beta-blocker, ARB   4. Type II DM  - A1c was 6.6% in July 2022  - Check  CBGs and use low-intensity SSI for now   5. Cerebral palsy; spastic quadriplegia  - Continue baclofen , supportive care    DVT prophylaxis: Lovenox   Code Status: Full  Level of Care: Level of care: Med-Surg Family Communication: None present   Disposition Plan:  Patient is from: home  Anticipated d/c is to: Home  Anticipated d/c date is: 04/23/23  Patient currently: Pending clinical improvement  Consults called: None  Admission status: Inpatient     Evalene GORMAN Sprinkles, MD [facility]  04/20/2023, 11:41 PM

## 2023-04-20 NOTE — ED Notes (Signed)
Pt incontinent of stool. Brief changed and pt cleaned. Pt placed in stretcher.

## 2023-04-20 NOTE — ED Provider Notes (Signed)
 [facility]GENC[facility] DEPARTMENT AT [facility] Provider Note   CSN: [id number] Arrival date & time: 04/20/23  1550     History  Chief Complaint  Patient presents with   Rash    Thomas Ramos is a 53 y.o. male.  The history is provided by the patient.  Rash He has history of hypertension, diabetes, hyperlipidemia, congenital cerebral palsy, quadriplegia, diastolic heart failure, asthma and comes in because of swelling in his left leg for the last 1-2 weeks.  He does complain of some pain.  Denies fever or chills.  He had seen his primary care provider who sent him in to evaluate for possible DVT.  He has had problems with cellulitis in the past which is usually required inpatient care.  He denies chest pain or dyspnea.    Home Medications Prior to Admission medications   Medication Sig Start Date End Date Taking? Authorizing Provider  Albuterol  Sulfate (PROAIR  RESPICLICK) 108 (90 Base) MCG/ACT AEPB Inhale 2 puffs into the lungs every 6 (six) hours as needed (Shortness of breath). 05/03/20   [provider]  amLODipine  (NORVASC ) 10 MG tablet Take 10 mg by mouth daily. 12/07/22   [provider]  aspirin  81 MG chewable tablet Chew 81 mg by mouth daily.    [provider]  baclofen  (LIORESAL ) 10 MG tablet Take 10 mg by mouth at bedtime. 06/01/18   [provider]  Dulaglutide 0.75 MG/0.5ML SOPN Inject 0.75 mg into the skin once a week. Wednesday 02/26/20   [provider]  furosemide  (LASIX ) 20 MG tablet Take 20 mg by mouth daily. 10/23/20   [provider]  latanoprost  (XALATAN ) 0.005 % ophthalmic solution Place 1 drop into both eyes at bedtime. 08/25/21   [provider]  metoprolol  succinate (TOPROL -XL) 25 MG 24 hr tablet Take 25 mg by mouth daily. 02/13/21   [provider]  montelukast  (SINGULAIR ) 10 MG tablet Take 1 tablet by mouth daily. 07/28/21   [provider]  nitroGLYCERIN  (NITROSTAT ) 0.4 MG  SL tablet Place 0.4 mg under the tongue every 5 (five) minutes as needed for chest pain.    [provider]  omeprazole  (PRILOSEC) 20 MG capsule Take 1 capsule by mouth every morning. 12/12/19   [provider]  oxyCODONE -acetaminophen  (PERCOCET/ROXICET) 5-325 MG tablet Take 1 tablet by mouth every 6 (six) hours as needed for severe pain (pain score 7-10). 01/25/23   Dasie Faden, MD  rosuvastatin  (CRESTOR ) 5 MG tablet Take 5 mg by mouth at bedtime.    [provider]  timolol  (TIMOPTIC ) 0.5 % ophthalmic solution Place 1 drop into both eyes 2 (two) times daily. 04/10/20   [provider]  valsartan (DIOVAN) 80 MG tablet Take 80 mg by mouth daily.    [provider]      Allergies    Penicillins, Sulfamethoxazole -trimethoprim , and Metronidazole     Review of Systems   Review of Systems  Skin:  Positive for rash.  All other systems reviewed and are negative.   Physical Exam Updated Vital Signs BP 102/63   Pulse 91   Temp (!) 97.4 F (36.3 C) (Oral)   Resp 16   Ht 5' 4 (1.626 m)   Wt 90.7 kg   SpO2 96%   BMI 34.33 kg/m  Physical Exam Vitals and nursing note reviewed.   53 year old male, resting comfortably and in no acute distress. Vital signs are normal. Oxygen saturation is 96%, which is normal. Head is  normocephalic and atraumatic. PERRLA, EOMI. Oropharynx is clear. Neck is nontender and supple without adenopathy. Lungs are clear without rales, wheezes, or rhonchi. Chest is nontender. Heart has regular rate and rhythm without murmur. Abdomen is soft, flat, nontender. Extremities: Flexion contractures are present of both arms and both legs.  There is 2+ pitting pretibial and pedal edema.  Left lower leg has erythema and warmth consistent with cellulitis. Skin is warm and dry without rash. Neurologic: Awake and alert, speech is dysarthric.  Spastic quadriplegia present.     ED Results / Procedures / Treatments   Labs (all labs  ordered are listed, but only abnormal results are displayed) Labs Reviewed  CBC WITH DIFFERENTIAL/PLATELET - Abnormal; Notable for the following components:      Result Value   Hemoglobin 12.6 (*)    HCT 37.6 (*)    All other components within normal limits  COMPREHENSIVE METABOLIC PANEL - Abnormal; Notable for the following components:   Potassium 3.2 (*)    Glucose, Bld 133 (*)    Total Protein 6.1 (*)    AST 11 (*)    All other components within normal limits  BRAIN NATRIURETIC PEPTIDE    EKG None  Radiology VAS US  LOWER EXTREMITY VENOUS (DVT) (7a-7p) Result Date: 04/20/2023  Lower Venous DVT Study Patient Name:  Thomas Ramos  Date of Exam:   04/20/2023 Medical Rec #: [id number]      Accession #:    [id number] Date of Birth: [birth year]       Patient Gender: M Patient Age:   72 years Exam Location:  [facility] Procedure:      VAS US  LOWER EXTREMITY VENOUS (DVT) Referring Phys: ALAN HARARI --------------------------------------------------------------------------------  Indications: Erythema, and Swelling.  Limitations: Patient with MS (severely contracted) and poor ultrasound/tissue interface. Comparison Study: Patient has had several previous exams, all undiagnostic due                   to above stated limitations. Performing Technologist: Ezzie Potters RVT, RDMS  Examination Guidelines: A complete evaluation includes B-mode imaging, spectral Doppler, color Doppler, and power Doppler as needed of all accessible portions of each vessel. Bilateral testing is considered an integral part of a complete examination. Limited examinations for reoccurring indications may be performed as noted. The reflux portion of the exam is performed with the patient in reverse Trendelenburg.  +-----+---------------+---------+-----------+----------+----------------------+ RIGHTCompressibilityPhasicitySpontaneityPropertiesThrombus Aging          +-----+---------------+---------+-----------+----------+----------------------+ CFV                 Yes      Yes                  patent by color and                                                      doppler                +-----+---------------+---------+-----------+----------+----------------------+   +--------+---------------+---------+-----------+----------+--------------------+ LEFT    CompressibilityPhasicitySpontaneityPropertiesThrombus Aging       +--------+---------------+---------+-----------+----------+--------------------+ CFV                    Yes      Yes                  patent by  color/doppler        +--------+---------------+---------+-----------+----------+--------------------+ SFJ                                                  unable to visualized +--------+---------------+---------+-----------+----------+--------------------+ FV Prox                                              unable to visualized +--------+---------------+---------+-----------+----------+--------------------+ FV Mid                 Yes      Yes                  patent by                                                                 color/doppler        +--------+---------------+---------+-----------+----------+--------------------+ FV                                                   unable to visualized Distal                                                                    +--------+---------------+---------+-----------+----------+--------------------+ PFV                                                  unable to visualized +--------+---------------+---------+-----------+----------+--------------------+ POP     Full           Yes      Yes                                       +--------+---------------+---------+-----------+----------+--------------------+ PTV                                                   unable to visualized +--------+---------------+---------+-----------+----------+--------------------+ PERO                                                 unable to visualized +--------+---------------+---------+-----------+----------+--------------------+    Summary: RIGHT: - No evidence of common femoral vein obstruction.  LEFT: - Mostly inconclusive study due to above  stated limitations. Limited visualization, no evidence of DVT in areas imaged. - No cystic structure found in the popliteal fossa.  *See table(s) above for measurements and observations.    Preliminary     Procedures Procedures    Medications Ordered in ED Medications  cefTRIAXone  (ROCEPHIN ) 2 g in sodium chloride  0.9 % 100 mL IVPB (has no administration in time range)  potassium chloride  SA (KLOR-CON  M) CR tablet 40 mEq (has no administration in time range)    ED Course/ Medical Decision Making/ A&P                                 Medical Decision Making  Erythema and warmth of left lower leg most consistent with cellulitis.  However, as he is quadriplegic, he is at risk for DVT.  Vascular ultrasound showed no evidence of DVT although it was a limited study.  Clinically, presentation is that of cellulitis.  On review of prior records, he has multiple prior episodes of cellulitis.  On multiple occasions, attempts were made to treat as outpatient and he almost invariably needed to return within several days for inpatient management.  I have reviewed his laboratory tests, and my interpretation is hypokalemia, normal WBC and differential, stable anemia.  Given his history of multiple episodes of cellulitis with failure of outpatient therapy, I feel it is more prudent to admit him for IV antibiotics.  I have ordered a dose of ceftriaxone , a dose of oral potassium.  I have discussed case with Dr. Charlton of [facility], who agrees to admit the patient.  Final Clinical  Impression(s) / ED Diagnoses Final diagnoses:  Cellulitis of left lower leg  Quadriplegia (HCC)  Hypokalemia  Normochromic normocytic anemia    Rx / DC Orders ED Discharge Orders     None         Raford Lenis, MD 04/20/23 2336

## 2023-04-20 NOTE — ED Notes (Signed)
 Patient transported to vascular.

## 2023-04-20 NOTE — ED Triage Notes (Signed)
Pt BIB [facility] from home for L leg rash/infection x2 weeks. H/x cerebral palsy. VSS.  120/80 96 HR 155 CBG

## 2023-04-21 DIAGNOSIS — E876 Hypokalemia: Secondary | ICD-10-CM

## 2023-04-21 DIAGNOSIS — L03116 Cellulitis of left lower limb: Secondary | ICD-10-CM | POA: Diagnosis not present

## 2023-04-21 DIAGNOSIS — G8250 Quadriplegia, unspecified: Secondary | ICD-10-CM

## 2023-04-21 DIAGNOSIS — D649 Anemia, unspecified: Secondary | ICD-10-CM | POA: Diagnosis not present

## 2023-04-21 LAB — CBC
HCT: 35 % — ABNORMAL LOW (ref 39.0–52.0)
Hemoglobin: 11.6 g/dL — ABNORMAL LOW (ref 13.0–17.0)
MCH: 27.9 pg (ref 26.0–34.0)
MCHC: 33.1 g/dL (ref 30.0–36.0)
MCV: 84.1 fL (ref 80.0–100.0)
Platelets: 144 10*3/uL — ABNORMAL LOW (ref 150–400)
RBC: 4.16 MIL/uL — ABNORMAL LOW (ref 4.22–5.81)
RDW: 12.5 % (ref 11.5–15.5)
WBC: 3.7 10*3/uL — ABNORMAL LOW (ref 4.0–10.5)
nRBC: 0 % (ref 0.0–0.2)

## 2023-04-21 LAB — BASIC METABOLIC PANEL
Anion gap: 11 (ref 5–15)
BUN: 13 mg/dL (ref 6–20)
CO2: 24 mmol/L (ref 22–32)
Calcium: 8.5 mg/dL — ABNORMAL LOW (ref 8.9–10.3)
Chloride: 103 mmol/L (ref 98–111)
Creatinine, Ser: 0.77 mg/dL (ref 0.61–1.24)
GFR, Estimated: >60 mL/min (ref >60)
Glucose, Bld: 105 mg/dL — ABNORMAL HIGH (ref 70–99)
Potassium: 3.4 mmol/L — ABNORMAL LOW (ref 3.5–5.1)
Sodium: 138 mmol/L (ref 135–145)

## 2023-04-21 LAB — CBG MONITORING, ED
Glucose-Capillary: 109 mg/dL — ABNORMAL HIGH (ref 70–99)
Glucose-Capillary: 140 mg/dL — ABNORMAL HIGH (ref 70–99)
Glucose-Capillary: 99 mg/dL (ref 70–99)
Glucose-Capillary: 99 mg/dL (ref 70–99)

## 2023-04-21 LAB — HEMOGLOBIN A1C
Hgb A1c MFr Bld: 5.9 % — ABNORMAL HIGH (ref 4.8–5.6)
Mean Plasma Glucose: 122.63 mg/dL

## 2023-04-21 LAB — GLUCOSE, CAPILLARY
Glucose-Capillary: 159 mg/dL — ABNORMAL HIGH (ref 70–99)
Glucose-Capillary: 127 mg/dL — ABNORMAL HIGH (ref 70–99)

## 2023-04-21 LAB — MAGNESIUM: Magnesium: 2 mg/dL (ref 1.7–2.4)

## 2023-04-21 MED ORDER — POTASSIUM CHLORIDE CRYS ER 20 MEQ PO TBCR
40.0000 meq | EXTENDED_RELEASE_TABLET | Freq: Once | ORAL | Status: AC
  Administered 2023-04-21: 40 meq via ORAL
  Filled 2023-04-21: qty 2

## 2023-04-21 MED ORDER — SODIUM CHLORIDE 0.9 % IV BOLUS
500.0000 mL | Freq: Once | INTRAVENOUS | Status: AC
  Administered 2023-04-21: 500 mL via INTRAVENOUS

## 2023-04-21 NOTE — Progress Notes (Signed)
 PROGRESS NOTE   Thomas Ramos  FMW:[id number]    DOB: May 11, 1970    DOA: 04/20/2023  PCP: Jacques Camie Pepper, PA-C   I have briefly reviewed patients previous medical records in [facility].  Chief Complaint  Patient presents with   Rash    Brief Hospital Course:  53 year old male, reportedly lives with his friend in an apartment, moves around with the help of a wheelchair, medical history significant for hypertension, hyperlipidemia, type 2 diabetes mellitus, chronic HFpEF, cerebral palsy and spastic quadriplegia, presented to the ED with complaints of left leg pain, swelling and redness.  Symptoms reportedly began roughly 2 weeks ago and continued to worsen.  He reports almost twice a year cellulitis that usually failed outpatient treatment.  Admitted for left leg cellulitis.   Assessment & Plan:  Principal Problem:   Left leg cellulitis Active Problems:   Type 2 diabetes mellitus with complication, without long-term current use of insulin  (HCC)   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HCC)   Chronic diastolic (congestive) heart failure (HCC)   Essential hypertension   Left leg cellulitis  Per patient report, appears to be a recurrent issue. Left lower extremity skin is extremely dry and scaly.  Does not have any overt open wounds but certainly could have micro wounds as source of entry. Left lower extremity venous Doppler: Limited study, mostly inconclusive but no evidence of DVT in areas imaged. Continue empirically started IV ceftriaxone  and monitor clinically.  Hypokalemia Replace and follow.  Magnesium  2.   Essential hypertension  Controlled on Norvasc , metoprolol , valsartan.  Continue   Chronic HFpEF  Appears compensated   Continue Lasix , beta-blocker, ARB    Type II DM  A1c was 6.6% in July 2022  Check CBGs and use low-intensity SSI for now  Controlled.   Cerebral palsy; spastic quadriplegia  Continue baclofen , supportive care   Mild pancytopenia Follow CBC in  AM.  Body mass index is 34.33 kg/m./Obesity   DVT prophylaxis: enoxaparin  (LOVENOX ) injection 40 mg Start: 04/20/23 2345     Code Status: Full Code:  Family Communication: None at bedside Disposition:  Status is: Inpatient Remains inpatient appropriate because: Pending improvement of his LLE cellulitis.  He is on IV antibiotics for same.     Consultants:     Procedures:     Antimicrobials:    IV ceftriaxone .  Subjective:  History as noted above.  Reports that he has cellulitis about twice a year.  Ongoing pain in left leg.  Able to feed himself with left hand.  Indicates that he had BM last night.  No other complaints reported.  Objective:   Vitals:   04/20/23 1600 04/20/23 2310 04/21/23 0521 04/21/23 0907  BP:  (!) 103/57 115/62 106/72  Pulse:  (!) 58 65 82  Resp:  18 16 16   Temp:  (!) 97.4 F (36.3 C) 97.8 F (36.6 C) 97.7 F (36.5 C)  TempSrc:  Oral Oral   SpO2:  100% 100% 100%  Weight: 90.7 kg     Height: 5' 4 (1.626 m)       General exam: Young male, moderately built and nourished lying comfortably propped up in bed.  Oral mucosa moist.  Does not look septic or toxic. Respiratory system: Clear to auscultation. Respiratory effort normal. Cardiovascular system: S1 & S2 heard, RRR. No JVD, murmurs, rubs, gallops or clicks. No pedal edema. Gastrointestinal system: Abdomen is nondistended, soft and nontender. No organomegaly or masses felt. Normal bowel sounds heard. Central nervous system: Alert  and oriented x 2 at least. No focal neurological deficits.  Dysarthric speech, likely chronic Extremities: Right upper extremity and bilateral lower extremity significant contractures.  Right upper extremity at least grade 3 x 5 power.  Left upper extremity graded 4+ by 5 power.  Bilateral lower extremities grade 2 x 5 power.  Left leg with mild swelling, patchy significant erythema as in picture below with associated increased warmth and some tenderness.  Extremely dry and  scaly skin.  Removed sock and examined foot, no overt open wounds.  Does have left big toe deformed and overlapping on the next toe. Skin: No rashes, lesions or ulcers Psychiatry: Judgement and insight appear normal. Mood & affect appropriate.   Picture from 04/20/23   Data Reviewed:   I have personally reviewed following labs and imaging studies   CBC: Recent Labs  Lab 04/20/23 1825 04/21/23 0457  WBC 5.2 3.7*  NEUTROABS 3.6  --   HGB 12.6* 11.6*  HCT 37.6* 35.0*  MCV 84.1 84.1  PLT 163 144*    Basic Metabolic Panel: Recent Labs  Lab 04/20/23 1825 04/21/23 0457  NA 140 138  K 3.2* 3.4*  CL 103 103  CO2 25 24  GLUCOSE 133* 105*  BUN 15 13  CREATININE 0.92 0.77  CALCIUM  9.0 8.5*  MG  --  2.0    Liver Function Tests: Recent Labs  Lab 04/20/23 1825  AST 11*  ALT 8  ALKPHOS 84  BILITOT 0.2  PROT 6.1*  ALBUMIN 3.5    CBG: Recent Labs  Lab 04/21/23 0038 04/21/23 0757  GLUCAP 140* 99    Microbiology Studies:  No results found for this or any previous visit (from the past 240 hours).  Radiology Studies:  VAS US  LOWER EXTREMITY VENOUS (DVT) (7a-7p) Result Date: 04/20/2023  Lower Venous DVT Study Patient Name:  Trajon C Zahner  Date of Exam:   04/20/2023 Medical Rec #: [id number]      Accession #:    [id number] Date of Birth: 08-02-1970       Patient Gender: M Patient Age:   27 years Exam Location:  [facility] Procedure:      VAS US  LOWER EXTREMITY VENOUS (DVT) Referring Phys: ALAN HARARI --------------------------------------------------------------------------------  Indications: Erythema, and Swelling.  Limitations: Patient with MS (severely contracted) and poor ultrasound/tissue interface. Comparison Study: Patient has had several previous exams, all undiagnostic due                   to above stated limitations. Performing Technologist: Ezzie Potters RVT, RDMS  Examination Guidelines: A complete evaluation includes B-mode imaging, spectral Doppler, color  Doppler, and power Doppler as needed of all accessible portions of each vessel. Bilateral testing is considered an integral part of a complete examination. Limited examinations for reoccurring indications may be performed as noted. The reflux portion of the exam is performed with the patient in reverse Trendelenburg.  +-----+---------------+---------+-----------+----------+----------------------+ RIGHTCompressibilityPhasicitySpontaneityPropertiesThrombus Aging         +-----+---------------+---------+-----------+----------+----------------------+ CFV                 Yes      Yes                  patent by color and  doppler                +-----+---------------+---------+-----------+----------+----------------------+   +--------+---------------+---------+-----------+----------+--------------------+ LEFT    CompressibilityPhasicitySpontaneityPropertiesThrombus Aging       +--------+---------------+---------+-----------+----------+--------------------+ CFV                    Yes      Yes                  patent by                                                                 color/doppler        +--------+---------------+---------+-----------+----------+--------------------+ SFJ                                                  unable to visualized +--------+---------------+---------+-----------+----------+--------------------+ FV Prox                                              unable to visualized +--------+---------------+---------+-----------+----------+--------------------+ FV Mid                 Yes      Yes                  patent by                                                                 color/doppler        +--------+---------------+---------+-----------+----------+--------------------+ FV                                                   unable to visualized Distal                                                                     +--------+---------------+---------+-----------+----------+--------------------+ PFV                                                  unable to visualized +--------+---------------+---------+-----------+----------+--------------------+ POP     Full           Yes      Yes                                       +--------+---------------+---------+-----------+----------+--------------------+  PTV                                                  unable to visualized +--------+---------------+---------+-----------+----------+--------------------+ PERO                                                 unable to visualized +--------+---------------+---------+-----------+----------+--------------------+    Summary: RIGHT: - No evidence of common femoral vein obstruction.  LEFT: - Mostly inconclusive study due to above stated limitations. Limited visualization, no evidence of DVT in areas imaged. - No cystic structure found in the popliteal fossa.  *See table(s) above for measurements and observations.    Preliminary     Scheduled Meds:    amLODipine   10 mg Oral Daily   aspirin   81 mg Oral Daily   baclofen   10 mg Oral QHS   enoxaparin  (LOVENOX ) injection  40 mg Subcutaneous Q24H   furosemide   20 mg Oral Daily   insulin  aspart  0-5 Units Subcutaneous QHS   insulin  aspart  0-6 Units Subcutaneous TID WC   irbesartan   75 mg Oral Daily   latanoprost   1 drop Both Eyes QHS   metoprolol  succinate  25 mg Oral Daily   montelukast   10 mg Oral Daily   pantoprazole   40 mg Oral Daily   rosuvastatin   5 mg Oral QHS   timolol   1 drop Both Eyes BID    Continuous Infusions:    cefTRIAXone  (ROCEPHIN )  IV Stopped (04/21/23 0050)     LOS: 1 day     Trenda Mar, MD,  FACP, [facility], [facility], [facility]   [facility] [place]      To contact the attending provider between 7A-7P or the covering  provider during after hours 7P-7A, please log into the web site [email or web address] and access using universal  password for that web site. If you do not have the password, please call the hospital operator.  04/21/2023, 10:41 AM

## 2023-04-21 NOTE — Progress Notes (Signed)
 Pt arrived to 6 north room 32 alert ad oriented. Pain level 0/10. Bed in lowest position. Call light in reach. Bed alarm on. Will continue to monitor pt.

## 2023-04-21 NOTE — ED Notes (Signed)
 I'm unable to check manual bp in right arm due to pt arm contracted in bent position.

## 2023-04-21 NOTE — Progress Notes (Signed)
 Dinner tray ordered for pt

## 2023-04-21 NOTE — ED Notes (Signed)
 I notified Dr Sherrod Dolphin of pt's hypotension. He instructed me to do bilat manual bp's on pt and placed orders for an IV fluid bolus.

## 2023-04-22 DIAGNOSIS — D649 Anemia, unspecified: Secondary | ICD-10-CM | POA: Diagnosis not present

## 2023-04-22 DIAGNOSIS — G8250 Quadriplegia, unspecified: Secondary | ICD-10-CM | POA: Diagnosis not present

## 2023-04-22 DIAGNOSIS — L03116 Cellulitis of left lower limb: Secondary | ICD-10-CM | POA: Diagnosis not present

## 2023-04-22 DIAGNOSIS — E876 Hypokalemia: Secondary | ICD-10-CM | POA: Diagnosis not present

## 2023-04-22 LAB — BASIC METABOLIC PANEL
Anion gap: 8 (ref 5–15)
BUN: 15 mg/dL (ref 6–20)
CO2: 24 mmol/L (ref 22–32)
Calcium: 8.8 mg/dL — ABNORMAL LOW (ref 8.9–10.3)
Chloride: 105 mmol/L (ref 98–111)
Creatinine, Ser: 0.93 mg/dL (ref 0.61–1.24)
GFR, Estimated: >60 mL/min (ref >60)
Glucose, Bld: 109 mg/dL — ABNORMAL HIGH (ref 70–99)
Potassium: 4 mmol/L (ref 3.5–5.1)
Sodium: 137 mmol/L (ref 135–145)

## 2023-04-22 LAB — GLUCOSE, CAPILLARY
Glucose-Capillary: 109 mg/dL — ABNORMAL HIGH (ref 70–99)
Glucose-Capillary: 166 mg/dL — ABNORMAL HIGH (ref 70–99)
Glucose-Capillary: 196 mg/dL — ABNORMAL HIGH (ref 70–99)
Glucose-Capillary: 188 mg/dL — ABNORMAL HIGH (ref 70–99)

## 2023-04-22 LAB — CBC
HCT: 34.5 % — ABNORMAL LOW (ref 39.0–52.0)
Hemoglobin: 11.8 g/dL — ABNORMAL LOW (ref 13.0–17.0)
MCH: 28.2 pg (ref 26.0–34.0)
MCHC: 34.2 g/dL (ref 30.0–36.0)
MCV: 82.3 fL (ref 80.0–100.0)
Platelets: 137 10*3/uL — ABNORMAL LOW (ref 150–400)
RBC: 4.19 MIL/uL — ABNORMAL LOW (ref 4.22–5.81)
RDW: 12.3 % (ref 11.5–15.5)
WBC: 3.9 10*3/uL — ABNORMAL LOW (ref 4.0–10.5)
nRBC: 0 % (ref 0.0–0.2)

## 2023-04-22 NOTE — TOC Initial Note (Signed)
 Transition of Care (TOC) - Initial/Assessment Note   Spoke to patient at bedside. Patient confirmed face sheet information. Patient from home with roommate . Patient has air mattress, hospital bed , and wheelchair at home.   Patient will need ambulance transport home. He does not have a key to get in, however his caregiver Thomas Ramos lives next door and he can call her and she will open door.    Transition of Care Department [facility]) has reviewed patient and no TOC needs have been identified at this time. We will continue to monitor patient advancement through interdisciplinary progression rounds. If new patient transition needs arise, please place a TOC consult.   Patient Details  Name: Thomas Ramos MRN: [id number] Date of Birth: [birth year]  Transition of Care [facility]) CM/SW Contact:    Stephane Powell Jansky, RN Phone Number: 04/22/2023, 1:21 PM  Clinical Narrative:                   Expected Discharge Plan: Home/Self Care Barriers to Discharge: Continued Medical Work up   Patient Goals and CMS Choice Patient states their goals for this hospitalization and ongoing recovery are:: to return to home          Expected Discharge Plan and Services   Discharge Planning Services: CM Consult Post Acute Care Choice: NA Living arrangements for the past 2 months: Apartment                 DME Arranged: N/A DME Agency: NA       HH Arranged: NA HH Agency: NA        Prior Living Arrangements/Services Living arrangements for the past 2 months: Apartment Lives with:: Roommate Patient language and need for interpreter reviewed:: Yes Do you feel safe going back to the place where you live?: Yes      Need for Family Participation in Patient Care: Yes (Comment) Care giver support system in place?: Yes (comment) Current home services: DME Criminal Activity/Legal Involvement Pertinent to Current Situation/Hospitalization: No - Comment as needed  Activities of Daily Living   ADL  Screening (condition at time of admission) Independently performs ADLs?: No Does the patient have a NEW difficulty with bathing/dressing/toileting/self-feeding that is expected to last >3 days?: No Does the patient have a NEW difficulty with getting in/out of bed, walking, or climbing stairs that is expected to last >3 days?: No Does the patient have a NEW difficulty with communication that is expected to last >3 days?: No Is the patient deaf or have difficulty hearing?: No Does the patient have difficulty seeing, even when wearing glasses/contacts?: No Does the patient have difficulty concentrating, remembering, or making decisions?: No  Permission Sought/Granted   Permission granted to share information with : No              Emotional Assessment Appearance:: Appears stated age Attitude/Demeanor/Rapport: Engaged Affect (typically observed): Accepting Orientation: : Oriented to Self, Oriented to Place, Oriented to  Time, Oriented to Situation Alcohol / Substance Use: Not Applicable Psych Involvement: No (comment)  Admission diagnosis:  Hypokalemia [E87.6] Quadriplegia (HCC) [G82.50] Left leg cellulitis [L03.116] Normochromic normocytic anemia [D64.9] Cellulitis of left lower leg [L03.116] Patient Active Problem List   Diagnosis Date Noted   Left leg cellulitis 04/20/2023   COVID-19 virus infection 07/14/2022   COVID 07/13/2022   Encounter for screening colonoscopy 06/23/2021   Wound of right foot 02/18/2021   Cough 09/20/2020   Heart murmur 02/26/2020   COVID-19 03/21/2019   Hiatal  hernia    Cellulitis of right lower extremity without foot 08/03/2017   Type 2 diabetes mellitus with complication, without long-term current use of insulin  (HCC) 08/03/2017   SIRS (systemic inflammatory response syndrome) (HCC)    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HCC)    Pressure injury of skin 08/16/2016   Tachycardia 08/15/2016   Sepsis (HCC) 08/15/2016   Onychomycosis 02/20/2016    Essential hypertension    OSA (obstructive sleep apnea) 12/11/2015   Chronic diastolic (congestive) heart failure (HCC)    Chronic venous insufficiency    Depression with anxiety 09/20/2015   CAP (community acquired pneumonia) 09/20/2015   Hypersomnia 07/16/2015   Asthma exacerbation 07/16/2015   GERD (gastroesophageal reflux disease) 07/16/2015   Snoring 07/16/2015   Hematemesis with nausea    Fever 05/24/2014   Hyperlipidemia 02/22/2012   Dysphagia 09/16/2010   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 [place] [address] Phone: [phone] Fax: [phone]  [facility] [address] [place] [address] Phone: [phone] Fax: [phone]     Social Drivers of Health (SDOH) Social History: SDOH Screenings   Food Insecurity: No Food Insecurity (04/21/2023)  Housing: Unknown (04/21/2023)  Transportation Needs: No Transportation Needs (04/21/2023)  Utilities: Not At Risk (04/21/2023)  Financial Resource Strain: Low Risk  (04/20/2023)   Received from [facility]  Physical Activity: Unknown (03/31/2022)   Received from [facility], [facility]  Social Connections: Moderately Integrated (03/31/2022)   [facility]  Stress: No Stress Concern Present (03/31/2022)   [facility]  Tobacco Use: Medium Risk (04/20/2023)   SDOH Interventions:     Readmission Risk Interventions    07/15/2022    2:11 PM  Readmission Risk Prevention Plan  Post Dischage Appt Complete  Medication Screening Complete  Transportation Screening Complete

## 2023-04-22 NOTE — Progress Notes (Signed)
 PROGRESS NOTE   Thomas Ramos  FMW:[id number]    DOB: 1970/08/25    DOA: 04/20/2023  PCP: Thomas Camie Pepper, PA-C   I have briefly reviewed patients previous medical records in [facility].  Chief Complaint  Patient presents with   Rash    Brief Hospital Course:  53 year old male, reportedly lives with his friend in an apartment, moves around with the help of a wheelchair, medical history significant for hypertension, hyperlipidemia, type 2 diabetes mellitus, chronic HFpEF, cerebral palsy and spastic quadriplegia, presented to the ED with complaints of left leg pain, swelling and redness.  Symptoms reportedly began roughly 2 weeks ago and continued to worsen.  He reports almost twice a year cellulitis that usually failed outpatient treatment.  Admitted for left leg cellulitis.  Improving.   Assessment & Plan:  Principal Problem:   Left leg cellulitis Active Problems:   Type 2 diabetes mellitus with complication, without long-term current use of insulin  (HCC)   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HCC)   Chronic diastolic (congestive) heart failure (HCC)   Essential hypertension   Left leg cellulitis  Per patient report, appears to be a recurrent issue. Left lower extremity skin is extremely dry and scaly.  Does not have any overt open wounds but certainly could have micro wounds as source of entry. Left lower extremity venous Doppler: Limited study, mostly inconclusive but no evidence of DVT in areas imaged. Continue empirically started IV ceftriaxone  and monitor clinically. Appears to be gradually improving.  Will add LLE compression stockings to help with the trace edema. Couple of hours after I had finished seeing him, he had his RN reach out to this MD requesting a second opinion.  Consulted infectious disease who may see him later today or tomorrow depending on their availability.  Hypokalemia Replaced.  Magnesium  2.   Essential hypertension  Patient with ongoing soft blood  pressures.  Held Norvasc , metoprolol , valsartan.  Continue low-dose oral Lasix .   Chronic HFpEF  Appears compensated except trace leg edema, left more so. Continue Lasix .  Held metoprolol  and valsartan due to soft blood pressures.   Type II DM  A1c was 6.6% in July 2022  Check CBGs and use low-intensity SSI for now  Controlled.   Cerebral palsy; spastic quadriplegia  Continue baclofen , supportive care   Mild pancytopenia Stable.  Continue to monitor CBCs.  Body mass index is 34.33 kg/m./Obesity   DVT prophylaxis: enoxaparin  (LOVENOX ) injection 40 mg Start: 04/20/23 2345     Code Status: Full Code:  Family Communication: None at bedside Disposition:  Continue IV antibiotics and reassess in a.m. and if improving, possible DC home on oral antibiotics.  This is also pending ID recommendations and clearance.     Consultants:   Infectious disease  Procedures:     Antimicrobials:    IV ceftriaxone .  Subjective:  When I asked how his left leg was doing, he answered coming along.  No pain reported.  Discussed with him that if he continues to improve, he may possibly discharge home tomorrow.  He verbalized understanding.  Objective:   Vitals:   04/22/23 0626 04/22/23 0807 04/22/23 0818 04/22/23 0937  BP: 121/74 (!) 90/51 (!) 101/58 (!) 102/55  Pulse: (!) 54 81 80 72  Resp: 18 16  17   Temp: 97.8 F (36.6 C)   97.7 F (36.5 C)  TempSrc: Oral   Oral  SpO2: 99% 100%  100%  Weight:      Height:  General exam: Young male, moderately built and nourished lying comfortably propped up in bed.  Oral mucosa moist.  Does not look septic or toxic. Respiratory system: Clear to auscultation. Respiratory effort normal. Cardiovascular system: S1 & S2 heard, RRR. No JVD, murmurs, rubs, gallops or clicks.  Trace leg edema, more so on left leg, this may be inflammatory due to cellulitis. Gastrointestinal system: Abdomen is nondistended, soft and nontender. No organomegaly or  masses felt. Normal bowel sounds heard. Central nervous system: Alert and oriented x 2 at least. No focal neurological deficits.  Dysarthric speech, likely chronic Extremities: Right upper extremity and bilateral lower extremity significant contractures.  Right upper extremity at least grade 3 x 5 power.  Left upper extremity graded 4+ by 5 power.  Bilateral lower extremities grade 2 x 5 power.  Left leg with mild swelling, patchy erythema which has improved since yesterday, not as warm as yesterday or as tender.  Extremely dry and scaly skin.  Removed sock which was compressing the lower leg.  Probably compression stocking is better than the sock.  Does have left big toe deformed and overlapping on the next toe. Skin: No rashes, lesions or ulcers Psychiatry: Judgement and insight appear normal. Mood & affect appropriate.   Picture from 04/20/23   Data Reviewed:   I have personally reviewed following labs and imaging studies   CBC: Recent Labs  Lab 04/20/23 1825 04/21/23 0457 04/22/23 0644  WBC 5.2 3.7* 3.9*  NEUTROABS 3.6  --   --   HGB 12.6* 11.6* 11.8*  HCT 37.6* 35.0* 34.5*  MCV 84.1 84.1 82.3  PLT 163 144* 137*    Basic Metabolic Panel: Recent Labs  Lab 04/20/23 1825 04/21/23 0457 04/22/23 0644  NA 140 138 137  K 3.2* 3.4* 4.0  CL 103 103 105  CO2 25 24 24   GLUCOSE 133* 105* 109*  BUN 15 13 15   CREATININE 0.92 0.77 0.93  CALCIUM  9.0 8.5* 8.8*  MG  --  2.0  --     Liver Function Tests: Recent Labs  Lab 04/20/23 1825  AST 11*  ALT 8  ALKPHOS 84  BILITOT 0.2  PROT 6.1*  ALBUMIN 3.5    CBG: Recent Labs  Lab 04/21/23 2116 04/22/23 0733 04/22/23 1309  GLUCAP 127* 109* 166*    Microbiology Studies:  No results found for this or any previous visit (from the past 240 hours).  Radiology Studies:  VAS US  LOWER EXTREMITY VENOUS (DVT) (7a-7p) Result Date: 04/21/2023  Lower Venous DVT Study Patient Name:  Thomas Ramos  Date of Exam:   04/20/2023 Medical Rec #:  [id number]      Accession #:    [id number] Date of Birth: [birth year]       Patient Gender: M Patient Age:   19 years Exam Location:  [facility] Procedure:      VAS US  LOWER EXTREMITY VENOUS (DVT) Referring Phys: Thomas Ramos --------------------------------------------------------------------------------  Indications: Erythema, and Swelling.  Limitations: Patient with MS (severely contracted) and poor ultrasound/tissue interface. Comparison Study: Patient has had several previous exams, all undiagnostic due                   to above stated limitations. Performing Technologist: Ezzie Potters RVT, RDMS  Examination Guidelines: A complete evaluation includes B-mode imaging, spectral Doppler, color Doppler, and power Doppler as needed of all accessible portions of each vessel. Bilateral testing is considered an integral part of a complete examination. Limited examinations for reoccurring indications  may be performed as noted. The reflux portion of the exam is performed with the patient in reverse Trendelenburg.  +-----+---------------+---------+-----------+----------+----------------------+ RIGHTCompressibilityPhasicitySpontaneityPropertiesThrombus Aging         +-----+---------------+---------+-----------+----------+----------------------+ CFV                 Yes      Yes                  patent by color and                                                      doppler                +-----+---------------+---------+-----------+----------+----------------------+   +--------+---------------+---------+-----------+----------+--------------------+ LEFT    CompressibilityPhasicitySpontaneityPropertiesThrombus Aging       +--------+---------------+---------+-----------+----------+--------------------+ CFV                    Yes      Yes                  patent by                                                                 color/doppler         +--------+---------------+---------+-----------+----------+--------------------+ SFJ                                                  unable to visualized +--------+---------------+---------+-----------+----------+--------------------+ FV Prox                                              unable to visualized +--------+---------------+---------+-----------+----------+--------------------+ FV Mid                 Yes      Yes                  patent by                                                                 color/doppler        +--------+---------------+---------+-----------+----------+--------------------+ FV                                                   unable to visualized Distal                                                                    +--------+---------------+---------+-----------+----------+--------------------+  PFV                                                  unable to visualized +--------+---------------+---------+-----------+----------+--------------------+ POP     Full           Yes      Yes                                       +--------+---------------+---------+-----------+----------+--------------------+ PTV                                                  unable to visualized +--------+---------------+---------+-----------+----------+--------------------+ PERO                                                 unable to visualized +--------+---------------+---------+-----------+----------+--------------------+     Summary: RIGHT: - No evidence of common femoral vein obstruction.  LEFT: - Mostly inconclusive study due to above stated limitations. Limited visualization, no evidence of DVT in areas imaged. - No cystic structure found in the popliteal fossa.  *See table(s) above for measurements and observations. Electronically signed by Norman Serve on 04/21/2023 at 5:38:34 PM.    Final     Scheduled Meds:     aspirin   81 mg Oral Daily   baclofen   10 mg Oral QHS   enoxaparin  (LOVENOX ) injection  40 mg Subcutaneous Q24H   furosemide   20 mg Oral Daily   insulin  aspart  0-5 Units Subcutaneous QHS   insulin  aspart  0-6 Units Subcutaneous TID WC   latanoprost   1 drop Both Eyes QHS   montelukast   10 mg Oral Daily   pantoprazole   40 mg Oral Daily   rosuvastatin   5 mg Oral QHS   timolol   1 drop Both Eyes BID    Continuous Infusions:    cefTRIAXone  (ROCEPHIN )  IV 2 g (04/21/23 2340)     LOS: 2 days     Trenda Mar, MD,  FACP, [facility], [facility], [facility]   [facility] [place]      To contact the attending provider between 7A-7P or the covering provider during after hours 7P-7A, please log into the web site [email or web address] and access using universal [place] password for that web site. If you do not have the password, please call the hospital operator.  04/22/2023, 3:11 PM

## 2023-04-22 NOTE — Plan of Care (Signed)

## 2023-04-23 ENCOUNTER — Other Ambulatory Visit (HOSPITAL_COMMUNITY): Payer: Self-pay

## 2023-04-23 DIAGNOSIS — L03116 Cellulitis of left lower limb: Secondary | ICD-10-CM | POA: Diagnosis not present

## 2023-04-23 LAB — CBC
HCT: 36.4 % — ABNORMAL LOW (ref 39.0–52.0)
Hemoglobin: 12.3 g/dL — ABNORMAL LOW (ref 13.0–17.0)
MCH: 27.8 pg (ref 26.0–34.0)
MCHC: 33.8 g/dL (ref 30.0–36.0)
MCV: 82.2 fL (ref 80.0–100.0)
Platelets: 141 10*3/uL — ABNORMAL LOW (ref 150–400)
RBC: 4.43 MIL/uL (ref 4.22–5.81)
RDW: 12.4 % (ref 11.5–15.5)
WBC: 4.1 10*3/uL (ref 4.0–10.5)
nRBC: 0 % (ref 0.0–0.2)

## 2023-04-23 LAB — GLUCOSE, CAPILLARY
Glucose-Capillary: 106 mg/dL — ABNORMAL HIGH (ref 70–99)
Glucose-Capillary: 125 mg/dL — ABNORMAL HIGH (ref 70–99)
Glucose-Capillary: 158 mg/dL — ABNORMAL HIGH (ref 70–99)

## 2023-04-23 MED ORDER — CEFADROXIL 500 MG PO CAPS
500.0000 mg | ORAL_CAPSULE | Freq: Two times a day (BID) | ORAL | Status: DC
  Administered 2023-04-23: 500 mg via ORAL
  Filled 2023-04-23: qty 1

## 2023-04-23 MED ORDER — CEFADROXIL 500 MG PO CAPS
500.0000 mg | ORAL_CAPSULE | Freq: Two times a day (BID) | ORAL | 0 refills | Status: DC
  Filled 2023-04-23: qty 11, 6d supply, fill #0

## 2023-04-23 MED ORDER — ACETAMINOPHEN 325 MG PO TABS: 650.0000 mg | ORAL_TABLET | Freq: Four times a day (QID) | ORAL | Status: AC | PRN

## 2023-04-23 NOTE — Plan of Care (Signed)
  Problem: Education: Goal: Ability to describe self-care measures that may prevent or decrease complications (Diabetes Survival Skills Education) will improve Outcome: Progressing   Problem: Coping: Goal: Ability to adjust to condition or change in health will improve Outcome: Progressing   Problem: Metabolic: Goal: Ability to maintain appropriate glucose levels will improve Outcome: Progressing   Problem: Nutritional: Goal: Maintenance of adequate nutrition will improve Outcome: Progressing   Problem: Skin Integrity: Goal: Risk for impaired skin integrity will decrease Outcome: Progressing   Problem: Tissue Perfusion: Goal: Adequacy of tissue perfusion will improve Outcome: Progressing   

## 2023-04-23 NOTE — Progress Notes (Signed)
 Patient has been educated to stop blood pressure medication due to low blood pressure. Patient verbalizes understanding. Patient on the phone with his PCP making a follow-up appointment.

## 2023-04-23 NOTE — Discharge Instructions (Signed)

## 2023-04-23 NOTE — Discharge Summary (Signed)
 Physician Discharge Summary  RALLY OUCH FMW:[id number] DOB: [birth year]  PCP: Jacques Camie Pepper, PA-C  Admitted from: Home Discharged to: Home  Admit date: 04/20/2023 Discharge date: 04/23/2023  Recommendations for Outpatient Follow-up:    Follow-up Information     Jacques Camie Pepper, PA-C. Schedule an appointment as soon as possible for a visit in 1 week(s).   Specialty: Physician Assistant Why: Follow-up 1) with repeat labs (CBC & BMP), 2) regarding resuming your multiple blood pressure/heart medications which were held at discharge from the hospital due to soft blood pressures & 3) to check on left leg skin rash. Contact information: [address] [place] [address] [phone]                  Home Health: None    Equipment/Devices: None    Discharge Condition: Improved and stable.   Code Status: Full Code Diet recommendation:  Discharge Diet Orders (From admission, onward)     Start     Ordered   04/23/23 0000  Diet - low sodium heart healthy        04/23/23 1435             Discharge Diagnoses:  Principal Problem:   Left leg cellulitis Active Problems:   Type 2 diabetes mellitus with complication, without long-term current use of insulin  (HCC)   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HCC)   Chronic diastolic (congestive) heart failure [facility])   Essential hypertension   Brief Hospital Course:  53 year old male, reportedly lives with his friend in an apartment, moves around with the help of a wheelchair, medical history significant for hypertension, hyperlipidemia, type 2 diabetes mellitus, chronic HFpEF, cerebral palsy and spastic quadriplegia, presented to the ED with complaints of left leg pain, swelling and redness.  Symptoms reportedly began roughly 2 weeks ago and continued to worsen.  He reports almost twice a year cellulitis that usually failed outpatient treatment.  Admitted for left leg cellulitis.  Improving.  Seen by Infectious  Disease on day of discharge.     Assessment & Plan:    Left leg cellulitis  Per patient report, appears to be a recurrent issue. Left lower extremity skin is extremely dry and scaly.  Does not have any overt open wounds but certainly could have micro wounds as source of entry. Left lower extremity venous Doppler: Limited study, mostly inconclusive but no evidence of DVT in areas imaged. Patient completed 2 days of IV ceftriaxone  in the hospital. Appears to be gradually improving.  Ordered LLE compression stockings to help with the trace edema but patient had not received this until this morning.  Reminded RN at bedside. Patient requested a second opinion yesterday.  Infectious disease was consulted.  Their input today appreciated.  Communicated with them.  They feel that the rash on the distal left lower leg is not consistent with typical cellulitis and question underlying vasculitis but since he had some improvement with ceftriaxone , they recommend completing 7 days course of antibiotics with cefadroxil , elevate lower extremity as able, compression stockings to help with fluid and follow-up with PCP to see if he would need any further evaluation for the rash.   Hypokalemia Replaced.  Magnesium  2.   Essential hypertension  Patient with ongoing soft blood pressures.  Held Norvasc , metoprolol , valsartan at discharge until close outpatient follow-up with PCP.  Continue low-dose oral Lasix .   Chronic HFpEF  Appears compensated except trace leg edema, left more so. Continue Lasix .  Held metoprolol  and valsartan  due to soft blood pressures.   Type II DM  A1c was 6.6% in July 2022  Mildly uncontrolled and fluctuating in the hospital while on SSI Continue home dulaglutide.   Cerebral palsy; spastic quadriplegia  Continue baclofen , supportive care    Mild pancytopenia Leukopenia resolved.  Hemoglobin stable.  Mild thrombocytopenia improving.   Body mass index is 34.33 kg/m./Obesity      Consultants:   Infectious disease   Procedures:       Discharge Instructions  Discharge Instructions     Call MD for:  extreme fatigue   Complete by: As directed    Call MD for:  persistant dizziness or light-headedness   Complete by: As directed    Call MD for:  redness, tenderness, or signs of infection (pain, swelling, redness, odor or green/yellow discharge around incision site)   Complete by: As directed    Call MD for:  severe uncontrolled pain   Complete by: As directed    Call MD for:  temperature >100.4   Complete by: As directed    Diet - low sodium heart healthy   Complete by: As directed    Discharge instructions   Complete by: As directed    Continue to wear left leg compression stocking.   Increase activity slowly   Complete by: As directed         Medication List     PAUSE taking these medications    amLODipine  10 MG tablet Wait to take this until your doctor or other care provider tells you to start again. Commonly known as: NORVASC  Take 5 mg by mouth daily.   metoprolol  succinate 25 MG 24 hr tablet Wait to take this until your doctor or other care provider tells you to start again. Commonly known as: TOPROL -XL Take 25 mg by mouth daily.   valsartan 80 MG tablet Wait to take this until your doctor or other care provider tells you to start again. Commonly known as: DIOVAN Take 80 mg by mouth daily.       STOP taking these medications    oxyCODONE -acetaminophen  5-325 MG tablet Commonly known as: PERCOCET/ROXICET       TAKE these medications    acetaminophen  325 MG tablet Commonly known as: TYLENOL  Take 2 tablets (650 mg total) by mouth every 6 (six) hours as needed for mild pain (pain score 1-3), moderate pain (pain score 4-6) or fever (or Fever >/= 101).   aspirin  81 MG chewable tablet Chew 81 mg by mouth daily.   baclofen  10 MG tablet Commonly known as: LIORESAL  Take 2.5 mg by mouth at bedtime.   cefadroxil  500 MG  capsule Commonly known as: DURICEF Take 1 capsule (500 mg total) by mouth 2 (two) times daily for 11 doses.   Dulaglutide 0.75 MG/0.5ML Soaj Inject 0.75 mg into the skin once a week. Wednesday   furosemide  20 MG tablet Commonly known as: LASIX  Take 20 mg by mouth daily.   latanoprost  0.005 % ophthalmic solution Commonly known as: XALATAN  Place 1 drop into both eyes at bedtime.   montelukast  10 MG tablet Commonly known as: SINGULAIR  Take 1 tablet by mouth daily.   nitroGLYCERIN  0.4 MG SL tablet Commonly known as: NITROSTAT  Place 0.4 mg under the tongue every 5 (five) minutes as needed for chest pain.   omeprazole  20 MG capsule Commonly known as: PRILOSEC Take 1 capsule by mouth every morning.   ProAir  RespiClick 108 (90 Base) MCG/ACT Aepb Generic drug: Albuterol  Sulfate Inhale 2 puffs  into the lungs every 6 (six) hours as needed (Shortness of breath).   rosuvastatin  5 MG tablet Commonly known as: CRESTOR  Take 5 mg by mouth at bedtime.   timolol  0.5 % ophthalmic solution Commonly known as: TIMOPTIC  Place 1 drop into both eyes 2 (two) times daily.       Allergies  Allergen Reactions   Penicillins Hives, Nausea And Vomiting and Other (See Comments)    Patient tolerated cefazolin  in 2017   Sulfamethoxazole -Trimethoprim  Nausea And Vomiting   Metronidazole  Nausea And Vomiting      Procedures/Studies: VAS US  LOWER EXTREMITY VENOUS (DVT) (7a-7p) Result Date: 04/21/2023  Lower Venous DVT Study Patient Name:  Thomas Ramos  Date of Exam:   04/20/2023 Medical Rec #: [id number]      Accession #:    [id number] Date of Birth: 1970-09-13       Patient Gender: M Patient Age:   55 years Exam Location:  [facility] Procedure:      VAS US  LOWER EXTREMITY VENOUS (DVT) Referring Phys: ALAN HARARI --------------------------------------------------------------------------------  Indications: Erythema, and Swelling.  Limitations: Patient with MS (severely contracted) and poor  ultrasound/tissue interface. Comparison Study: Patient has had several previous exams, all undiagnostic due                   to above stated limitations. Performing Technologist: Ezzie Potters RVT, RDMS  Examination Guidelines: A complete evaluation includes B-mode imaging, spectral Doppler, color Doppler, and power Doppler as needed of all accessible portions of each vessel. Bilateral testing is considered an integral part of a complete examination. Limited examinations for reoccurring indications may be performed as noted. The reflux portion of the exam is performed with the patient in reverse Trendelenburg.  +-----+---------------+---------+-----------+----------+----------------------+ RIGHTCompressibilityPhasicitySpontaneityPropertiesThrombus Aging         +-----+---------------+---------+-----------+----------+----------------------+ CFV                 Yes      Yes                  patent by color and                                                      doppler                +-----+---------------+---------+-----------+----------+----------------------+   +--------+---------------+---------+-----------+----------+--------------------+ LEFT    CompressibilityPhasicitySpontaneityPropertiesThrombus Aging       +--------+---------------+---------+-----------+----------+--------------------+ CFV                    Yes      Yes                  patent by                                                                 color/doppler        +--------+---------------+---------+-----------+----------+--------------------+ SFJ  unable to visualized +--------+---------------+---------+-----------+----------+--------------------+ FV Prox                                              unable to visualized +--------+---------------+---------+-----------+----------+--------------------+ FV Mid                 Yes      Yes                   patent by                                                                 color/doppler        +--------+---------------+---------+-----------+----------+--------------------+ FV                                                   unable to visualized Distal                                                                    +--------+---------------+---------+-----------+----------+--------------------+ PFV                                                  unable to visualized +--------+---------------+---------+-----------+----------+--------------------+ POP     Full           Yes      Yes                                       +--------+---------------+---------+-----------+----------+--------------------+ PTV                                                  unable to visualized +--------+---------------+---------+-----------+----------+--------------------+ PERO                                                 unable to visualized +--------+---------------+---------+-----------+----------+--------------------+     Summary: RIGHT: - No evidence of common femoral vein obstruction.  LEFT: - Mostly inconclusive study due to above stated limitations. Limited visualization, no evidence of DVT in areas imaged. - No cystic structure found in the popliteal fossa.  *See table(s) above for measurements and observations. Electronically signed by Norman Serve on 04/21/2023 at 5:38:34 PM.    Final       Subjective: Patient evaluated along with his RN in the room.  Patient denied complaints.  Specifically no pain reported.  He initially wanted to see if he could discharge tomorrow.  Advised him that if ID switched him over to oral antibiotics and cleared him for discharge, there would be no medical reason for him to stay in the hospital.  Subsequently [facility] and RN spoke with him with family at bedside and he was agreeable to DC today.  Discharge  Exam:  Vitals:   04/22/23 1706 04/22/23 1953 04/22/23 2357 04/23/23 0730  BP: 112/68 103/68 (!) 95/53 102/68  Pulse: 77 86 67 62  Resp: 18 16  16   Temp: 97.8 F (36.6 C) 98 F (36.7 C) 98.5 F (36.9 C) 98.1 F (36.7 C)  TempSrc: Oral Oral Oral Oral  SpO2: 100% 97% 100% 100%  Weight:      Height:        General exam: Young male, moderately built and nourished lying comfortably propped up in bed.  Oral mucosa moist.  Does not look septic or toxic. Respiratory system: Clear to auscultation. Respiratory effort normal. Cardiovascular system: S1 & S2 heard, RRR. No JVD, murmurs, rubs, gallops or clicks.  Trace leg edema, more so on left leg, this may be inflammatory due to cellulitis.  No compression stocking yet this morning. Gastrointestinal system: Abdomen is nondistended, soft and nontender. No organomegaly or masses felt. Normal bowel sounds heard. Central nervous system: Alert and oriented x 2 at least. No focal neurological deficits.  Dysarthric speech, likely chronic Extremities: Right upper extremity and bilateral lower extremity significant contractures.  Right upper extremity at least grade 3 x 5 power.  Left upper extremity graded 4+ by 5 power.  Bilateral lower extremities grade 2 x 5 power.  Left leg with mild swelling, patchy erythema which continues to improve, minimally warm compared to days ago, nontender.  Extremely dry and scaly skin.  Had sore on the left foot which was compressing on the lower leg just above the ankle when advised him that he should avoid wearing tight socks like that for some time.  Does have left big toe deformed and overlapping on the next toe.  Details in picture below from 1/4 on admission with pink/purplish patchy rash as noted Skin: No rashes, lesions or ulcers Psychiatry: Judgement and insight appear normal. Mood & affect appropriate.     The results of significant diagnostics from this hospitalization (including imaging, microbiology, ancillary  and laboratory) are listed below for reference.     Microbiology: No results found for this or any previous visit (from the past 240 hours).   Labs: CBC: Recent Labs  Lab 04/20/23 1825 04/21/23 0457 04/22/23 0644 04/23/23 0819  WBC 5.2 3.7* 3.9* 4.1  NEUTROABS 3.6  --   --   --   HGB 12.6* 11.6* 11.8* 12.3*  HCT 37.6* 35.0* 34.5* 36.4*  MCV 84.1 84.1 82.3 82.2  PLT 163 144* 137* 141*    Basic Metabolic Panel: Recent Labs  Lab 04/20/23 1825 04/21/23 0457 04/22/23 0644  NA 140 138 137  K 3.2* 3.4* 4.0  CL 103 103 105  CO2 25 24 24   GLUCOSE 133* 105* 109*  BUN 15 13 15   CREATININE 0.92 0.77 0.93  CALCIUM  9.0 8.5* 8.8*  MG  --  2.0  --     Liver Function Tests: Recent Labs  Lab 04/20/23 1825  AST 11*  ALT 8  ALKPHOS 84  BILITOT 0.2  PROT 6.1*  ALBUMIN 3.5    CBG: Recent Labs  Lab 04/22/23  1309 04/22/23 1542 04/22/23 2108 04/23/23 0752 04/23/23 1139  GLUCAP 166* 196* 188* 106* 125*    Hgb A1c Recent Labs    04/21/23 0457  HGBA1C 5.9*    Unsuccessfully attempted to reach patient's listed friend via phone.  Left VM.   Time coordinating discharge: 35 minutes  SIGNED:  Trenda Mar, MD,  FACP, [facility], [facility], [facility]   [facility] [place]     To contact the attending provider between 7A-7P or the covering provider during after hours 7P-7A, please log into the web site [email or web address] and access using universal Ponder password for that web site. If you do not have the password, please call the hospital operator.

## 2023-04-23 NOTE — Progress Notes (Signed)
 Ted hose applied to patients right leg per Dr. Florie Husband order

## 2023-04-23 NOTE — Progress Notes (Addendum)
 Pt picked up by [facility] at 1045 to be transported home. Pt was reminded to not take his blood pressure medication until he see's his primary care physician. Pt apprehensive, But verbalized understanding

## 2023-04-23 NOTE — TOC Progression Note (Addendum)
 Transition of Care [facility]) - Progression Note    Patient Details  Name: Thomas Ramos MRN: [id number] Date of Birth: 02/13/1971  [facility] CM/SW Contact  Stephane Powell Jansky, RN Phone Number: 04/23/2023, 2:05 PM  Clinical Narrative:     Patient being discharged today. Family at bedside aware. Patient called caregiver Bascom Ellen, staff will be at the home after 5 pm. [facility] arranged for 5PM . Patient , family, Bascom and nurse aware   Confirmed address with patient and visitors   Expected Discharge Plan: Home/Self Care Barriers to Discharge: Continued Medical Work up  Expected Discharge Plan and Services   Discharge Planning Services: CM Consult Post Acute Care Choice: NA Living arrangements for the past 2 months: Apartment                 DME Arranged: N/A DME Agency: NA       HH Arranged: NA HH Agency: NA         Social Determinants of Health (SDOH) Interventions SDOH Screenings   Food Insecurity: No Food Insecurity (04/21/2023)  Housing: Unknown (04/21/2023)  Transportation Needs: No Transportation Needs (04/21/2023)  Utilities: Not At Risk (04/21/2023)  Financial Resource Strain: Low Risk  (04/20/2023)   Received from [facility]  Physical Activity: Unknown (03/31/2022)   Received from [facility], [facility]  Social Connections: Moderately Integrated (03/31/2022)   [facility]  Stress: No Stress Concern Present (03/31/2022)   [facility]  Tobacco Use: Medium Risk (04/20/2023)    Readmission Risk Interventions    07/15/2022    2:11 PM  Readmission Risk Prevention Plan  Post Dischage Appt Complete  Medication Screening Complete  Transportation Screening Complete

## 2023-04-23 NOTE — H&P (Addendum)
 I have seen and examined the patient. I have personally reviewed the clinical findings, laboratory findings, microbiological data and imaging studies. The assessment and treatment plan was discussed with the Nurse Practitioner. I agree with her/his recommendations except following additions/corrections.  # ? Cellulitis in left lower extremity - presented with leg swelling and redness for 2 weeks, no fevers and chills so far this hospitalization.   Exam - adult male lying in the bed, dysarthric speech, chronic, appears comfortable and non toxic appearing. Heart, lung and abdomen wnl. Contractures in upper and lower extremities. Awake, alert and oriented, Left leg with no active redness, warmth, tenderness ( Improved )  Will switch to cefadroxil  today to complete 7 days course for probable cellulitis  ID will so, please call with questions or concerns   I have personally spent 80 minutes involved in face-to-face and non-face-to-face activities for this patient on the day of the visit. Professional time spent includes the following activities: Preparing to see the patient (review of tests), Obtaining and/or reviewing separately obtained history (admission/discharge record), Performing a medically appropriate examination and/or evaluation , Ordering medications/tests/procedures, referring and communicating with other health care professionals, Documenting clinical information in the EMR, Independently interpreting results (not separately reported), Communicating results to the patient/family/caregiver, Counseling and educating the patient/family/caregiver and Care coordination (not separately reported).        [facility]    Date of Admission:  04/20/2023     Total days of antibiotics 3               Reason for Consult: Cellulitis    Referring Provider: Dr. Judeth Primary Care Provider: Jacques Camie Pepper, PA-C   ASSESSMENT:  Mr. 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 Rash on distal lower leg appears to be improving but does not appear consistent with typical cellulitis and question underlying vasculitis given presentation. Will treat for infection as he appears to be improving with ceftriaxone  and will change antibiotics to Cefadroxil  as he has tolerated Cephalexin  in the past for a total of 5 more days. Elevate lower extremities as able and agree with compression socks to help with fluid. No additional follow up with ID needed and continue care with PCP. Remaining medical and supportive care per Internal Medicine.    PLAN:  Change antibiotics to Cefadroxil  500 mg PO bid x 5 days.  Elevate legs as able with compression socks to help with fluid management.  Follow up with PCP for additional work up as indicated.  Remaining medical and supportive care per Internal Medicine.    Principal Problem:   Left leg cellulitis Active Problems:   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HCC)   Chronic diastolic (congestive) heart failure (HCC)   Essential hypertension   Type 2 diabetes mellitus with complication, without long-term current use of insulin  (HCC)    aspirin   81 mg Oral Daily   baclofen   10 mg Oral QHS   cefadroxil   500 mg Oral BID   enoxaparin  (LOVENOX ) injection  40 mg Subcutaneous Q24H   furosemide   20 mg Oral Daily   insulin  aspart  0-5 Units Subcutaneous QHS   insulin  aspart  0-6 Units Subcutaneous TID WC   latanoprost   1 drop Both Eyes QHS   montelukast   10 mg Oral Daily   pantoprazole   40 mg  Oral Daily   rosuvastatin   5 mg Oral QHS   timolol   1 drop Both Eyes BID     HPI: Thomas Ramos is a 53 y.o. male with previous medical history of cerebral palsy, spastic quadriplegia, diabetes, hypertension, and chronic HFpEF presenting to the hospital with left leg rash  and swelling x 2 weeks.  Mr. Ellinger was seen at [facility] on 04/20/23 with one week history of worsening left leg swelling and redness and diangosed with cellulitis with recommendation to go to the ED to rule out DVT and treat infection. Previous hospitalization for right lower extremity cellulitis on 08/03/17 that was treated with clindamycin .   Afebrile with no leukocytosis. Started on ceftriaxone  and tolerating antibiotics. Has a previous allergy to Penicillin. Requesting to stay until at least Saturday. Gets episodes of cellulitis at least 2 times per year but last episode was 2 years ago. No fevers, chills, or sweats.    Review of Systems: Review of Systems  Constitutional:  Negative for chills, fever and weight loss.  Respiratory:  Negative for cough, shortness of breath and wheezing.   Cardiovascular:  Negative for chest pain and leg swelling.  Gastrointestinal:  Negative for abdominal pain, constipation, diarrhea, nausea and vomiting.  Skin:  Positive for rash.     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HCC)    S/P Botox  injection    approx every 4 months   Seasonal allergies    Past Surgical History:  Procedure Laterality Date   BIOPSY  06/24/2021   Procedure: BIOPSY;  Surgeon: Legrand Victory LITTIE DOUGLAS, MD;  Location: [facility];  Service: Gastroenterology;;   BOTOX  INJECTION N/A 12/21/2012   Procedure: BOTOX  INJECTION;  Surgeon: Lamar JONETTA Aho, MD;  Location: [facility];  Service: Endoscopy;  Laterality: N/A;    BOTOX  INJECTION N/A 06/28/2014   Procedure: BOTOX  INJECTION;  Surgeon: Lamar JONETTA Aho, MD;  Location: [facility];  Service: Endoscopy;  Laterality: N/A;   COLONOSCOPY WITH PROPOFOL  N/A 06/24/2021   Procedure: COLONOSCOPY WITH PROPOFOL ;  Surgeon: Legrand Victory LITTIE DOUGLAS, MD;  Location: [facility];  Service: Gastroenterology;  Laterality: N/A;   ESOPHAGOGASTRODUODENOSCOPY N/A 12/21/2012   Procedure: ESOPHAGOGASTRODUODENOSCOPY (EGD);  Surgeon: Lamar JONETTA Aho, MD;  Location: [facility];  Service: Endoscopy;  Laterality: N/A;   ESOPHAGOGASTRODUODENOSCOPY N/A 06/28/2014   Procedure: ESOPHAGOGASTRODUODENOSCOPY (EGD);  Surgeon: Lamar JONETTA Aho, MD;  Location[facility];  Service: Endoscopy;  Laterality: N/A;   ESOPHAGOGASTRODUODENOSCOPY N/A 06/24/2021   Procedure: ESOPHAGOGASTRODUODENOSCOPY (EGD);  Surgeon: Legrand Victory LITTIE DOUGLAS, MD;  Location: [facility];  Service: Gastroenterology;  Laterality: N/A;   ESOPHAGOGASTRODUODENOSCOPY (EGD) WITH PROPOFOL  N/A 09/03/2017   Procedure: ESOPHAGOGASTRODUODENOSCOPY (EGD) WITH PROPOFOL ;  Surgeon: Legrand Victory LITTIE DOUGLAS, MD;  Location: [facility];  Service: Gastroenterology;  Laterality: N/A;   ESOPHAGUS SURGERY     stretched esophagus   legs     MOUTH SURGERY     POLYPECTOMY  06/24/2021   Procedure: POLYPECTOMY;  Surgeon: Legrand Victory LITTIE DOUGLAS, MD;  Location: [facility];  Service: Gastroenterology;;   TENDON RELEASE       Social History   Tobacco Use   Smoking status: Never   Smokeless tobacco: Former   Tobacco comments:    smoked one time  Vaping Use   Vaping status: Never Used  Substance Use Topics   Alcohol use: Yes    Comment: 2 beers every other Friday and Saturday   Drug use: No    Family History  Problem Relation Age of Onset   Hypertension Father    Lung cancer Father    Diabetes Mother     Allergies  Allergen Reactions   Penicillins Hives, Nausea And Vomiting and Other (See Comments)    Patient tolerated cefazolin  in 2017    Sulfamethoxazole -Trimethoprim  Nausea And Vomiting   Metronidazole  Nausea And Vomiting    OBJECTIVE: Blood pressure 102/68, pulse 62, temperature 98.1 F (36.7 C), temperature source Oral, resp. rate 16, height 5' 4 (1.626 m), weight 90.7 kg, SpO2 100%.  Physical Exam Constitutional:      General: He is not in acute distress.    Appearance: He is well-developed.  Cardiovascular:     Rate and Rhythm: Normal rate and regular rhythm.     Heart sounds: Normal heart sounds.  Pulmonary:     Effort: Pulmonary effort is normal.     Breath sounds: Normal breath sounds.  Skin:    General: Skin is warm and dry.     Comments: Purplish colored sporadic rash located on the left distal lower extremity. Appears lighter in color than previous pictures. There is dependent edema in his feet with socks on.   Neurological:     Mental Status: He is alert.  Psychiatric:        Mood and Affect: Mood normal.     Lab Results Lab Results  Component Value Date   WBC 4.1 04/23/2023   HGB 12.3 (L) 04/23/2023   HCT 36.4 (L) 04/23/2023   MCV 82.2 04/23/2023   PLT 141 (L) 04/23/2023    Lab Results  Component Value Date   CREATININE 0.93 04/22/2023   BUN 15 04/22/2023   NA 137 04/22/2023   K 4.0 04/22/2023   CL 105 04/22/2023   CO2 24 04/22/2023    Lab Results  Component Value Date   ALT 8 04/20/2023   AST 11 (L) 04/20/2023   ALKPHOS 84 04/20/2023   BILITOT 0.2 04/20/2023     Microbiology: No results found for this or any previous visit (from the past 240 hours).  I have personally spent 32 minutes involved in face-to-face and non-face-to-face activities for this patient on the day of the visit. Professional time spent includes the following activities: Preparing to see the patient (review of tests), Obtaining and/or reviewing separately obtained history (admission/discharge record), Performing a medically appropriate examination and/or evaluation , Ordering medications/tests/procedures,  referring and communicating with other health care professionals, Documenting clinical information in the EMR, Independently interpreting results (not separately reported), Communicating results to the patient/family/caregiver, Counseling and educating the patient/family/caregiver and Care coordination (not separately reported).   Greg Calone, NP [facility]  04/23/2023  11:14 AM

## 2023-04-25 ENCOUNTER — Encounter (HOSPITAL_COMMUNITY): Payer: Self-pay | Admitting: Emergency Medicine

## 2023-04-25 ENCOUNTER — Observation Stay (HOSPITAL_COMMUNITY)
Admission: EM | Admit: 2023-04-25 | Discharge: 2023-04-26 | Disposition: A | Discharge status: Home / Self Care | Payer: Medicare Other | Attending: Internal Medicine | Admitting: Internal Medicine

## 2023-04-25 DIAGNOSIS — G8250 Quadriplegia, unspecified: Secondary | ICD-10-CM | POA: Diagnosis not present

## 2023-04-25 DIAGNOSIS — I872 Venous insufficiency (chronic) (peripheral): Secondary | ICD-10-CM | POA: Diagnosis not present

## 2023-04-25 DIAGNOSIS — Z7901 Long term (current) use of anticoagulants: Secondary | ICD-10-CM | POA: Insufficient documentation

## 2023-04-25 DIAGNOSIS — I10 Essential (primary) hypertension: Secondary | ICD-10-CM | POA: Diagnosis present

## 2023-04-25 DIAGNOSIS — L03116 Cellulitis of left lower limb: Secondary | ICD-10-CM | POA: Diagnosis present

## 2023-04-25 DIAGNOSIS — Z79899 Other long term (current) drug therapy: Secondary | ICD-10-CM | POA: Diagnosis not present

## 2023-04-25 DIAGNOSIS — I110 Hypertensive heart disease with heart failure: Secondary | ICD-10-CM | POA: Insufficient documentation

## 2023-04-25 DIAGNOSIS — Z7984 Long term (current) use of oral hypoglycemic drugs: Secondary | ICD-10-CM | POA: Diagnosis not present

## 2023-04-25 DIAGNOSIS — E1169 Type 2 diabetes mellitus with other specified complication: Secondary | ICD-10-CM | POA: Insufficient documentation

## 2023-04-25 DIAGNOSIS — I5032 Chronic diastolic (congestive) heart failure: Secondary | ICD-10-CM | POA: Diagnosis not present

## 2023-04-25 DIAGNOSIS — E118 Type 2 diabetes mellitus with unspecified complications: Secondary | ICD-10-CM | POA: Diagnosis not present

## 2023-04-25 DIAGNOSIS — K219 Gastro-esophageal reflux disease without esophagitis: Secondary | ICD-10-CM | POA: Diagnosis not present

## 2023-04-25 DIAGNOSIS — G809 Cerebral palsy, unspecified: Secondary | ICD-10-CM | POA: Diagnosis present

## 2023-04-25 DIAGNOSIS — R21 Rash and other nonspecific skin eruption: Secondary | ICD-10-CM | POA: Diagnosis not present

## 2023-04-25 LAB — CBC WITH DIFFERENTIAL/PLATELET
Abs Immature Granulocytes: 0.03 10*3/uL (ref 0.00–0.07)
Basophils Absolute: 0 10*3/uL (ref 0.0–0.1)
Basophils Relative: 1 %
Eosinophils Absolute: 0.1 10*3/uL (ref 0.0–0.5)
Eosinophils Relative: 2 %
HCT: 38.9 % — ABNORMAL LOW (ref 39.0–52.0)
Hemoglobin: 12.8 g/dL — ABNORMAL LOW (ref 13.0–17.0)
Immature Granulocytes: 1 %
Lymphocytes Relative: 25 %
Lymphs Abs: 1.4 10*3/uL (ref 0.7–4.0)
MCH: 27.6 pg (ref 26.0–34.0)
MCHC: 32.9 g/dL (ref 30.0–36.0)
MCV: 83.8 fL (ref 80.0–100.0)
Monocytes Absolute: 0.5 10*3/uL (ref 0.1–1.0)
Monocytes Relative: 10 %
Neutro Abs: 3.3 10*3/uL (ref 1.7–7.7)
Neutrophils Relative %: 61 %
Platelets: 168 10*3/uL (ref 150–400)
RBC: 4.64 MIL/uL (ref 4.22–5.81)
RDW: 12.5 % (ref 11.5–15.5)
WBC: 5.4 10*3/uL (ref 4.0–10.5)
nRBC: 0 % (ref 0.0–0.2)

## 2023-04-25 LAB — BASIC METABOLIC PANEL
Anion gap: 13 (ref 5–15)
BUN: 20 mg/dL (ref 6–20)
CO2: 24 mmol/L (ref 22–32)
Calcium: 9 mg/dL (ref 8.9–10.3)
Chloride: 99 mmol/L (ref 98–111)
Creatinine, Ser: 1.1 mg/dL (ref 0.61–1.24)
GFR, Estimated: >60 mL/min (ref >60)
Glucose, Bld: 118 mg/dL — ABNORMAL HIGH (ref 70–99)
Potassium: 3.6 mmol/L (ref 3.5–5.1)
Sodium: 136 mmol/L (ref 135–145)

## 2023-04-25 LAB — GLUCOSE, CAPILLARY: Glucose-Capillary: 134 mg/dL — ABNORMAL HIGH (ref 70–99)

## 2023-04-25 LAB — CBG MONITORING, ED
Glucose-Capillary: 103 mg/dL — ABNORMAL HIGH (ref 70–99)
Glucose-Capillary: 168 mg/dL — ABNORMAL HIGH (ref 70–99)

## 2023-04-25 LAB — C-REACTIVE PROTEIN: CRP: 1 mg/dL — ABNORMAL HIGH (ref <1.0)

## 2023-04-25 LAB — MRSA NEXT GEN BY PCR, NASAL: MRSA by PCR Next Gen: NOT DETECTED

## 2023-04-25 LAB — SEDIMENTATION RATE: Sed Rate: 16 mm/h (ref 0–16)

## 2023-04-25 MED ORDER — ACETAMINOPHEN 650 MG RE SUPP
650.0000 mg | Freq: Four times a day (QID) | RECTAL | Status: DC | PRN
Start: 2023-04-25 — End: 2023-04-26

## 2023-04-25 MED ORDER — LATANOPROST 0.005 % OP SOLN
1.0000 [drp] | Freq: Every day | OPHTHALMIC | Status: DC
  Administered 2023-04-25: 1 [drp] via OPHTHALMIC
  Filled 2023-04-25 (×2): qty 2.5

## 2023-04-25 MED ORDER — PANTOPRAZOLE SODIUM 40 MG PO TBEC
40.0000 mg | DELAYED_RELEASE_TABLET | Freq: Every day | ORAL | Status: DC
  Administered 2023-04-25 – 2023-04-26 (×2): 40 mg via ORAL
  Filled 2023-04-25 (×2): qty 1

## 2023-04-25 MED ORDER — TIMOLOL MALEATE 0.5 % OP SOLN
1.0000 [drp] | Freq: Two times a day (BID) | OPHTHALMIC | Status: DC
  Administered 2023-04-25 – 2023-04-26 (×3): 1 [drp] via OPHTHALMIC
  Filled 2023-04-25: qty 5

## 2023-04-25 MED ORDER — FUROSEMIDE 40 MG PO TABS
20.0000 mg | ORAL_TABLET | Freq: Every day | ORAL | Status: DC
  Administered 2023-04-25: 20 mg via ORAL
  Filled 2023-04-25: qty 1

## 2023-04-25 MED ORDER — ONDANSETRON HCL 4 MG/2ML IJ SOLN: 4.0000 mg | Freq: Four times a day (QID) | INTRAMUSCULAR | Status: DC | PRN

## 2023-04-25 MED ORDER — INSULIN ASPART 100 UNIT/ML IJ SOLN
0.0000 [IU] | Freq: Three times a day (TID) | INTRAMUSCULAR | Status: DC
  Administered 2023-04-25: 1 [IU] via SUBCUTANEOUS

## 2023-04-25 MED ORDER — HEPARIN SODIUM (PORCINE) 5000 UNIT/ML IJ SOLN
5000.0000 [IU] | Freq: Three times a day (TID) | INTRAMUSCULAR | Status: DC
  Administered 2023-04-25 – 2023-04-26 (×4): 5000 [IU] via SUBCUTANEOUS
  Filled 2023-04-25 (×4): qty 1

## 2023-04-25 MED ORDER — ASPIRIN 81 MG PO CHEW
81.0000 mg | CHEWABLE_TABLET | Freq: Every day | ORAL | Status: DC
  Administered 2023-04-25 – 2023-04-26 (×2): 81 mg via ORAL
  Filled 2023-04-25 (×2): qty 1

## 2023-04-25 MED ORDER — CEFTRIAXONE SODIUM 1 G IJ SOLR
1.0000 g | Freq: Once | INTRAMUSCULAR | Status: AC
  Administered 2023-04-25: 1 g via INTRAVENOUS
  Filled 2023-04-25: qty 10

## 2023-04-25 MED ORDER — ONDANSETRON HCL 4 MG PO TABS: 4.0000 mg | ORAL_TABLET | Freq: Four times a day (QID) | ORAL | Status: DC | PRN

## 2023-04-25 MED ORDER — VANCOMYCIN HCL IN DEXTROSE 1-5 GM/200ML-% IV SOLN: 1000.0000 mg | Freq: Once | INTRAVENOUS | Status: DC

## 2023-04-25 MED ORDER — ACETAMINOPHEN 500 MG PO TABS
1000.0000 mg | ORAL_TABLET | Freq: Four times a day (QID) | ORAL | Status: DC | PRN
  Administered 2023-04-25: 1000 mg via ORAL
  Filled 2023-04-25: qty 2

## 2023-04-25 MED ORDER — BACLOFEN 10 MG PO TABS
10.0000 mg | ORAL_TABLET | Freq: Every day | ORAL | Status: DC
  Administered 2023-04-25: 10 mg via ORAL
  Filled 2023-04-25: qty 1

## 2023-04-25 MED ORDER — VANCOMYCIN HCL 1750 MG/350ML IV SOLN
1750.0000 mg | Freq: Once | INTRAVENOUS | Status: AC
  Administered 2023-04-25: 1750 mg via INTRAVENOUS
  Filled 2023-04-25: qty 350

## 2023-04-25 MED ORDER — INSULIN ASPART 100 UNIT/ML IJ SOLN: 0.0000 [IU] | Freq: Every day | INTRAMUSCULAR | Status: DC

## 2023-04-25 MED ORDER — CEFADROXIL 500 MG PO CAPS
500.0000 mg | ORAL_CAPSULE | Freq: Two times a day (BID) | ORAL | Status: DC
  Administered 2023-04-25 – 2023-04-26 (×3): 500 mg via ORAL
  Filled 2023-04-25 (×4): qty 1

## 2023-04-25 MED ORDER — POLYETHYLENE GLYCOL 3350 17 G PO PACK: 17.0000 g | PACK | Freq: Every day | ORAL | Status: DC | PRN

## 2023-04-25 MED ORDER — TRAMADOL HCL 50 MG PO TABS
50.0000 mg | ORAL_TABLET | Freq: Four times a day (QID) | ORAL | Status: DC | PRN
  Administered 2023-04-25 – 2023-04-26 (×3): 50 mg via ORAL
  Filled 2023-04-25 (×3): qty 1

## 2023-04-25 NOTE — Assessment & Plan Note (Signed)
 Stable. SSI.

## 2023-04-25 NOTE — Assessment & Plan Note (Signed)
 Continue PPI ?

## 2023-04-25 NOTE — Assessment & Plan Note (Addendum)
 Based on available pictures, pt's bruising and edema have improved. Normal WBC. It does not look like MRSA infection. Will check MRSA screen. I don't think his cellulitis is any worse. Discussed with pt that it would take several days of Po abx for the cellulitis to improve. Check CRP, ESR. Send ASO titer. Pt given IV rocephin  and Vanco.  Could add some po bactrim  if community-acquired MRSA is a consideration.  Will try and have the discharging physician from 04-23-2023 look at his leg to see if it any worse.  If not worse, pt can go home.  Pt stated that the TED hose he was wearing in his left leg was too tight and causing some pain. I placed an ACE bandage to give compression and it will not be a tight as an TED hose.  Prn tylenol  for mild pain. Prn ultram  for moderate/severe pain.  Continue with duricef. Can consider addition of bactrim  if MRSA screen is positive.  It would be helpful to include a picture at discharge of his left lower leg in case pt comes back to the ER in order to compare with any future complaints of his left leg.  04-20-2023 04-25-2023

## 2023-04-25 NOTE — Assessment & Plan Note (Signed)
 Continue with ACE bandage to left lower leg for compression and to help with edema.

## 2023-04-25 NOTE — Assessment & Plan Note (Addendum)
 Stable. At prior discharge on 04-23-2023, his diovan, toprol -xl, norvasc  were held until he could be seen by PCP.

## 2023-04-25 NOTE — ED Triage Notes (Signed)
 Patient BIB EMS from home. Patient lives at home by himself, but has a 24/h caregiver. He states family is planning to visit tomorrow. Patient c/o LLE skin infection.  Patient was here yesterday, and was given a compression sock, he states that is not helping the pain.

## 2023-04-25 NOTE — Progress Notes (Signed)
 PROGRESS NOTE   Thomas Ramos  FMW:[id number]    DOB: 1970-05-02    DOA: 04/25/2023  PCP: Jacques Camie Pepper, PA-C   I have briefly reviewed patients previous medical [facility] in [facility].  Chief Complaint  Patient presents with   Skin Problem    Brief Hospital Course:  53 year old male, reportedly lives alone but has a caregiver, moves around with the help of a wheelchair, medical history significant for hypertension, hyperlipidemia, type 2 diabetes mellitus, chronic HFpEF, cerebral palsy and spastic quadriplegia, hospitalized 2//25-04/23/2023 for left leg cellulitis, treated with 4 days of IV ceftriaxone , infectious disease consulted on day of discharge, cellulitis was improving and patient was discharged home on cefadroxil  to complete total 7 days course of antibiotics.  Left leg compression stocking was placed on day of discharge to address underlying edema to promote further healing.  Patient then returned to the ED on 2/8 night with complaints of severe left leg pain.   Assessment & Plan:  Principal Problem:   Left leg cellulitis Active Problems:   Type 2 diabetes mellitus with complication, without long-term current use of insulin  (HCC)   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HCC)   GERD (gastroesophageal reflux disease)   Chronic venous insufficiency   Chronic diastolic (congestive) heart failure (HCC)   Essential hypertension   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HCC)   Left leg cellulitis This is a recurrent issue.  Please refer to detailed discharge summary by this MD on 2/7 It appears that his left leg pain may be more due to the compression stocking placed on day of DC. The cellulitis itself has improved or even resolved.  He does have some residual skin changes/bruising or rash of unclear etiology.  In the ED, the compression stocking was taken down and Ace wrap was placed with significant improvement in his pain and symptoms.   He has no fever, leukocytosis, CRP of 1 and ESR of 16  also argues against ongoing infection.  MRSA PCR negative.  Blood cultures x 2: Negative to date. Requested ID to reassess, input pending Continue and complete Cefadroxil  at this time. Continue multimodality pain control including tramadol . As discussed in DC summary recently, if the rash or bruising or discoloration of his left leg does not resolve, or worsens as outpatient, could consider dermatology or rheumatology consultation.  Patient himself indicates that every time he has an infection, he develops skin discoloration as he currently has.  Essential hypertension  Since his recent hospitalization, patient continues to have soft blood pressures with SBP mostly in the 90s.   Continue to hold Norvasc , metoprolol , valsartan.   Continue low-dose oral Lasix .   Chronic HFpEF  Appears compensated except trace leg edema, left more so.  The left leg edema may be more inflammatory than related to heart failure. Continue Lasix .  Held metoprolol  and valsartan due to soft blood pressures.   Type II DM  A1c was 6.6% in July 2022  Mildly uncontrolled and fluctuating in the hospital while on SSI Home Dulaglutide on hold.   Cerebral palsy; spastic quadriplegia  Continue baclofen , supportive care    Mild pancytopenia Resolved.  There is no height or weight on file to calculate BMI.   DVT prophylaxis: heparin  injection 5,000 Units Start: 04/25/23 0600     Code Status: Full Code:  Family Communication: None at bedside Disposition:  Status is: Observation The patient remains OBS appropriate and will d/c before 2 midnights.     Consultants:   Infectious disease  Procedures:  Antimicrobials:   Cefadroxil    Subjective:  History as noted above.  He reports that he was doing okay until last night when started having severe pain prompting him to come to the ED.  He states that the compression stocking was too tight.  Seen in the ED this morning, now on Ace wrap and feels much better  with improved pain.  Objective:   Vitals:   04/25/23 1219 04/25/23 1240 04/25/23 1300 04/25/23 1451  BP:  (!) 91/56 114/87 (!) 95/59  Pulse:  69 79 69  Resp:  16 (!) 23 16  Temp: 98 F (36.7 C)   97.6 F (36.4 C)  TempSrc: Oral   Oral  SpO2:  99% 95% 98%    General exam: Young male, small built and moderately nourished, lying comfortably supine in bed.  Oral mucosa moist. Respiratory system: Clear to auscultation. Respiratory effort normal. Cardiovascular system: S1 & S2 heard, RRR. No JVD, murmurs, rubs, gallops or clicks. No pedal edema. Gastrointestinal system: Abdomen is nondistended, soft and nontender. No organomegaly or masses felt. Normal bowel sounds heard. Central nervous system: Alert and oriented. No focal neurological deficits. Extremities: Right upper extremity and bilateral lower extremity significant contractures. Right upper extremity at least grade 3 x 5 power. Left upper extremity graded 4+ by 5 power. Bilateral lower extremities grade 2 x 5 power.  Compared to recent discharge, left leg is less swollen, no significant increase in warmth, minimal tenderness.  No fluctuance or crepitus.?  Bruising or rash noted on left anterior lower leg as in pictures below, some areas of blackish discoloration and other areas dark red but no areas of erythema around the rash itself. Skin: No rashes, lesions or ulcers Psychiatry: Judgement and insight appear normal. Mood & affect appropriate.   Picture from 04/20/2023 from previous admission    Pictures from 04/25/2023      Data Reviewed:   I have personally reviewed following labs and imaging studies   CBC: Recent Labs  Lab 04/20/23 1825 04/21/23 0457 04/22/23 0644 04/23/23 0819 04/25/23 0134  WBC 5.2   < > 3.9* 4.1 5.4  NEUTROABS 3.6  --   --   --  3.3  HGB 12.6*   < > 11.8* 12.3* 12.8*  HCT 37.6*   < > 34.5* 36.4* 38.9*  MCV 84.1   < > 82.3 82.2 83.8  PLT 163   < > 137* 141* 168   < > = values in this interval  not displayed.    Basic Metabolic Panel: Recent Labs  Lab 04/20/23 1825 04/21/23 0457 04/22/23 0644 04/25/23 0134  NA 140 138 137 136  K 3.2* 3.4* 4.0 3.6  CL 103 103 105 99  CO2 25 24 24 24   GLUCOSE 133* 105* 109* 118*  BUN 15 13 15 20   CREATININE 0.92 0.77 0.93 1.10  CALCIUM  9.0 8.5* 8.8* 9.0  MG  --  2.0  --   --     Liver Function Tests: Recent Labs  Lab 04/20/23 1825  AST 11*  ALT 8  ALKPHOS 84  BILITOT 0.2  PROT 6.1*  ALBUMIN 3.5    CBG: Recent Labs  Lab 04/23/23 1701 04/25/23 0832 04/25/23 1130  GLUCAP 158* 103* 168*    Microbiology Studies:   Recent Results (from the past 240 hours)  Blood culture (routine x 2)     Status: None (Preliminary result)   Collection Time: 04/25/23 12:43 AM   Specimen: BLOOD  Result Value Ref Range Status  Specimen Description BLOOD LEFT ANTECUBITAL  Final   Special Requests   Final    BOTTLES DRAWN AEROBIC AND ANAEROBIC Blood Culture results may not be optimal due to an inadequate volume of blood received in culture bottles   Culture   Final    NO GROWTH < 12 HOURS Performed at [facility], [address] [place], [place] [address]    Report Status PENDING  Incomplete  Blood culture (routine x 2)     Status: None (Preliminary result)   Collection Time: 04/25/23  1:36 AM   Specimen: BLOOD  Result Value Ref Range Status   Specimen Description BLOOD BLOOD LEFT HAND  Final   Special Requests   Final    BOTTLES DRAWN AEROBIC AND ANAEROBIC Blood Culture results may not be optimal due to an inadequate volume of blood received in culture bottles   Culture   Final    NO GROWTH < 12 HOURS Performed at [facility], [address] [place], [place] [address]    Report Status PENDING  Incomplete  MRSA Next Gen by PCR, Nasal     Status: None   Collection Time: 04/25/23 12:43 PM   Specimen: Nasal Swab  Result Value Ref Range Status   MRSA by PCR Next Gen NOT DETECTED NOT DETECTED Final    Comment:  (NOTE) The GeneXpert MRSA Assay (FDA approved for NASAL specimens only), is one component of a comprehensive MRSA colonization surveillance program. It is not intended to diagnose MRSA infection nor to guide or monitor treatment for MRSA infections. Test performance is not FDA approved in patients less than 96 years old. Performed at [facility], [address], [place], [place] [address]     Radiology Studies:  No results found.  Scheduled Meds:    aspirin   81 mg Oral Daily   baclofen   10 mg Oral QHS   cefadroxil   500 mg Oral BID   furosemide   20 mg Oral Daily   heparin   5,000 Units Subcutaneous Q8H   insulin  aspart  0-5 Units Subcutaneous QHS   insulin  aspart  0-6 Units Subcutaneous TID WC   latanoprost   1 drop Both Eyes QHS   pantoprazole   40 mg Oral Daily   timolol   1 drop Both Eyes BID    Continuous Infusions:     LOS: 0 days     Trenda Mar, MD,  FACP, [facility], [facility], [facility]   [facility]      To contact the attending provider between 7A-7P or the covering provider during after hours 7P-7A, please log into the web site [email or web address] and access using universal [place] password for that web site. If you do not have the password, please call the hospital operator.  04/25/2023, 3:34 PM

## 2023-04-25 NOTE — ED Notes (Signed)
 Pt requesting pain meds RN made aware.

## 2023-04-25 NOTE — ED Provider Notes (Signed)
 [place] EMERGENCY DEPARTMENT AT [facility] Provider Note   CSN: [id number] Arrival date & time: 04/25/23  0015     History  Chief Complaint  Patient presents with   Skin Problem    Thomas Ramos is a 53 y.o. male.  The history is provided by the patient.  Rash Location:  Leg Leg rash location:  L lower leg Quality: painful, redness and swelling   Pain details:    Quality:  Aching   Severity:  Moderate   Timing:  Constant   Progression:  Worsening Onset quality:  Gradual Timing:  Constant Progression:  Worsening Context: not sick contacts   Associated symptoms: no abdominal pain   Patient with cerebral palsy and spinal paralysis presents with cellulitis following discharge from the hospital.       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HCC)    S/P Botox  injection    approx every 4 months   Seasonal allergies      Home Medications Prior to Admission medications   Medication Sig Start Date End Date Taking? Authorizing Provider  acetaminophen  (TYLENOL ) 325 MG tablet Take 2 tablets (650 mg total) by mouth every 6 (six) hours as needed for mild pain (pain score 1-3), moderate pain (pain score 4-6) or fever (or Fever >/= 101). 04/23/23   Hongalgi, Trenda BIRCH, MD  Albuterol  Sulfate (PROAIR  RESPICLICK) 108 (90 Base) MCG/ACT AEPB Inhale 2 puffs into the lungs every 6 (six) hours as needed (Shortness of breath). 05/03/20   [provider]  amLODipine  (NORVASC ) 10 MG tablet Take 5 mg by mouth daily.  12/07/22   [provider]  aspirin  81 MG chewable tablet Chew 81 mg by mouth daily. Patient not taking: Reported on 04/21/2023    [provider]  baclofen  (LIORESAL ) 10 MG tablet Take 2.5 mg by mouth at bedtime. 06/01/18   [provider]  cefadroxil  (DURICEF) 500 MG capsule Take 1 capsule (500 mg total) by mouth 2 (two) times daily for 11 doses. 04/23/23 04/29/23  Hongalgi, Anand D, MD  Dulaglutide 0.75 MG/0.5ML SOPN Inject 0.75 mg into the skin once a week. Wednesday 02/26/20   [provider]  furosemide  (LASIX ) 20 MG tablet Take 20 mg by mouth daily. 10/23/20   [provider]  latanoprost  (XALATAN ) 0.005 % ophthalmic solution Place 1 drop into both eyes at bedtime. 08/25/21   [provider]  metoprolol  succinate (TOPROL -XL) 25 MG 24 hr tablet Take 25 mg by mouth daily. 02/13/21   [provider]  montelukast  (SINGULAIR ) 10 MG tablet Take 1 tablet by mouth daily. 07/28/21   [provider]  nitroGLYCERIN  (NITROSTAT ) 0.4 MG SL tablet Place 0.4 mg under the tongue every 5 (five) minutes as needed for chest pain. Patient not taking: Reported on 04/21/2023    [provider]  omeprazole  (PRILOSEC) 20 MG capsule Take 1 capsule by mouth every morning. 12/12/19   [provider]  rosuvastatin  (  CRESTOR ) 5 MG tablet Take 5 mg by mouth at bedtime.    [provider]  timolol  (TIMOPTIC ) 0.5 % ophthalmic solution Place 1 drop into both eyes 2 (two) times daily. 04/10/20   [provider]  valsartan (DIOVAN) 80 MG tablet Take 80 mg by mouth daily.    [provider]      Allergies    Penicillins, Sulfamethoxazole -trimethoprim , and Metronidazole     Review of Systems   Review of Systems  Cardiovascular:  Positive for leg swelling.  Gastrointestinal:  Negative for abdominal pain.  Skin:  Positive for color change and rash.  All other systems reviewed and are negative.   Physical Exam Updated  Vital Signs BP 110/63   Pulse 77   Temp 97.7 F (36.5 C) (Oral)   Resp 18   SpO2 100%  Physical Exam Vitals and nursing note reviewed.  Constitutional:      General: He is not in acute distress.    Appearance: He is well-developed. He is not diaphoretic.  HENT:     Head: Normocephalic and atraumatic.     Nose: Nose normal.  Eyes:     Conjunctiva/sclera: Conjunctivae normal.     Pupils: Pupils are equal, round, and reactive to light.  Cardiovascular:     Rate and Rhythm: Normal rate and regular rhythm.     Pulses: Normal pulses.     Heart sounds: Normal heart sounds.  Pulmonary:     Effort: Pulmonary effort is normal.     Breath sounds: Normal breath sounds. No wheezing or rales.  Abdominal:     General: Bowel sounds are normal.     Palpations: Abdomen is soft.     Tenderness: There is no abdominal tenderness. There is no guarding or rebound.  Musculoskeletal:        General: Normal range of motion.     Cervical back: Normal range of motion and neck supple.  Skin:    General: Skin is warm and dry.     Capillary Refill: Capillary refill takes less than 2 seconds.  Neurological:     General: No focal deficit present.     Mental Status: He is alert and oriented to person, place, and time.     Deep Tendon Reflexes: Reflexes normal.  Psychiatric:        Mood and Affect: Mood normal.        Behavior: Behavior normal.     ED Results / Procedures / Treatments   Labs (all labs ordered are listed, but only abnormal results are displayed) Results for orders placed or performed during the hospital encounter of 04/25/23  CBC with Differential   Collection Time: 04/25/23  1:34 AM  Result Value Ref Range   WBC 5.4 4.0 - 10.5 K/uL   RBC 4.64 4.22 - 5.81 MIL/uL   Hemoglobin 12.8 (L) 13.0 - 17.0 g/dL   HCT 61.0 (L) 60.9 - 47.9 %   MCV 83.8 80.0 - 100.0 fL   MCH 27.6 26.0 - 34.0 pg   MCHC 32.9 30.0 - 36.0 g/dL   RDW 87.4 88.4 - 84.4 %   Platelets 168 150 - 400 K/uL   nRBC 0.0  0.0 - 0.2 %   Neutrophils Relative % 61 %   Neutro Abs 3.3 1.7 - 7.7 K/uL   Lymphocytes Relative 25 %   Lymphs Abs 1.4 0.7 - 4.0 K/uL   Monocytes Relative 10 %   Monocytes Absolute 0.5 0.1 - 1.0 K/uL   Eosinophils  Relative 2 %   Eosinophils Absolute 0.1 0.0 - 0.5 K/uL   Basophils Relative 1 %   Basophils Absolute 0.0 0.0 - 0.1 K/uL   Immature Granulocytes 1 %   Abs Immature Granulocytes 0.03 0.00 - 0.07 K/uL  Basic metabolic panel   Collection Time: 04/25/23  1:34 AM  Result Value Ref Range   Sodium 136 135 - 145 mmol/L   Potassium 3.6 3.5 - 5.1 mmol/L   Chloride 99 98 - 111 mmol/L   CO2 24 22 - 32 mmol/L   Glucose, Bld 118 (H) 70 - 99 mg/dL   BUN 20 6 - 20 mg/dL   Creatinine, Ser 8.89 0.61 - 1.24 mg/dL   Calcium  9.0 8.9 - 10.3 mg/dL   GFR, Estimated >39 >39 mL/min   Anion gap 13 5 - 15  C-reactive protein   Collection Time: 04/25/23  1:34 AM  Result Value Ref Range   CRP 1.0 (H) <1.0 mg/dL   VAS US  LOWER EXTREMITY VENOUS (DVT) (7a-7p) Result Date: 04/21/2023  Lower Venous DVT Study Patient Name:  Thomas Ramos  Date of Exam:   04/20/2023 Medical Rec #: [id number]      Accession #:    [id number] Date of Birth: 20-Apr-1970       Patient Gender: M Patient Age:   7 years Exam [facility]:  [facility] Procedure:      VAS US  LOWER EXTREMITY VENOUS (DVT) Referring Phys: ALAN HARARI --------------------------------------------------------------------------------  Indications: Erythema, and Swelling.  Limitations: Patient with MS (severely contracted) and poor ultrasound/tissue interface. Comparison Study: Patient has had several previous exams, all undiagnostic due                   to above stated limitations. Performing Technologist: Ezzie Potters RVT, RDMS  Examination Guidelines: A complete evaluation includes B-mode imaging, spectral Doppler, color Doppler, and power Doppler as needed of all accessible portions of each vessel. Bilateral testing is considered an integral part of a  complete examination. Limited examinations for reoccurring indications may be performed as noted. The reflux portion of the exam is performed with the patient in reverse Trendelenburg.  +-----+---------------+---------+-----------+----------+----------------------+ RIGHTCompressibilityPhasicitySpontaneityPropertiesThrombus Aging         +-----+---------------+---------+-----------+----------+----------------------+ CFV                 Yes      Yes                  patent by color and                                                      doppler                +-----+---------------+---------+-----------+----------+----------------------+   +--------+---------------+---------+-----------+----------+--------------------+ LEFT    CompressibilityPhasicitySpontaneityPropertiesThrombus Aging       +--------+---------------+---------+-----------+----------+--------------------+ CFV                    Yes      Yes                  patent by  color/doppler        +--------+---------------+---------+-----------+----------+--------------------+ SFJ                                                  unable to visualized +--------+---------------+---------+-----------+----------+--------------------+ FV Prox                                              unable to visualized +--------+---------------+---------+-----------+----------+--------------------+ FV Mid                 Yes      Yes                  patent by                                                                 color/doppler        +--------+---------------+---------+-----------+----------+--------------------+ FV                                                   unable to visualized Distal                                                                     +--------+---------------+---------+-----------+----------+--------------------+ PFV                                                  unable to visualized +--------+---------------+---------+-----------+----------+--------------------+ POP     Full           Yes      Yes                                       +--------+---------------+---------+-----------+----------+--------------------+ PTV                                                  unable to visualized +--------+---------------+---------+-----------+----------+--------------------+ PERO                                                 unable to visualized +--------+---------------+---------+-----------+----------+--------------------+     Summary: RIGHT: - No evidence of common femoral vein obstruction.  LEFT: - Mostly inconclusive study due to  above stated limitations. Limited visualization, no evidence of DVT in areas imaged. - No cystic structure found in the popliteal fossa.  *See table(s) above for measurements and observations. Electronically signed by Norman Serve on 04/21/2023 at 5:38:34 PM.    Final    '  Radiology No results found.  Procedures Procedures    Medications Ordered in ED Medications  vancomycin  (VANCOREADY) IVPB 1750 mg/350 mL (1,750 mg Intravenous New Bag/Given 04/25/23 0236)  cefTRIAXone  (ROCEPHIN ) 1 g in sodium chloride  0.9 % 100 mL IVPB (0 g Intravenous Stopped 04/25/23 0236)    ED Course/ Medical Decision Making/ A&P                                 Medical Decision Making Patient with worsening swelling and cellulitis   Amount and/or Complexity of Data Reviewed External Data Reviewed: labs and notes.    Details: Previous notes and labs reviewed  Labs: ordered.    Details: Normal white count 5.4, normal hemoglobin 12.8, normal platelets. Normal sodium 136,  normal potassium 3.6, normal creatinine 1.1 elevated CRP 1  Risk Prescription drug management. Decision regarding  hospitalization.    Final Clinical Impression(s) / ED Diagnoses Final diagnoses:  Cellulitis of left lower extremity   The patient appears reasonably stabilized for admission considering the current resources, flow, and capabilities available in the ED at this time, and I doubt any other [facility] requiring further screening and/or treatment in the ED prior to admission.  Rx / DC Orders ED Discharge Orders     None         Samayra Hebel, MD 04/25/23 9690

## 2023-04-25 NOTE — Care Management Obs Status (Addendum)
 MEDICARE OBSERVATION STATUS NOTIFICATION   Patient Details  Name: Thomas Ramos MRN: [id number] Date of Birth: 02/20/1971   Medicare Observation Status Notification Given:  Yes  Patient is alert and oriented he states he is his own legal guardian  Corean JAYSON Canary, RN 04/25/2023, 2:08 PM

## 2023-04-25 NOTE — Consult Note (Signed)
 [facility]       Reason for Consult:rash    Referring Physician: Dr. Judeth  Principal Problem:   Left leg cellulitis Active Problems:   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HCC)   GERD (gastroesophageal reflux disease)   Chronic venous insufficiency   Chronic diastolic (congestive) heart failure (HCC)   Essential hypertension   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HCC)   Type 2 diabetes mellitus with complication, without long-term current use of insulin  (HCC)    aspirin   81 mg Oral Daily   baclofen   10 mg Oral QHS   cefadroxil   500 mg Oral BID   furosemide   20 mg Oral Daily   heparin   5,000 Units Subcutaneous Q8H   insulin  aspart  0-5 Units Subcutaneous QHS   insulin  aspart  0-6 Units Subcutaneous TID WC   latanoprost   1 drop Both Eyes QHS   pantoprazole   40 mg Oral Daily   timolol   1 drop Both Eyes BID    Recommendations: Consider outpatient referral to dermatology for evaluation and possible biopsy  No indication for antibiotics   Hepatitis C antibody (I have added on)  Assessment: Rash of his left lower leg.  Not much change in appearance compared to picture from last week.  No warmth, no tenderness.  Vasculitis appearance.    I will otherwise sign off.    Evaluation of this patient requires complex antimicrobial therapy evaluation and counseling + isolation needs for disease transmission risk assessment and mitigation.   HPI: Thomas Ramos is a 53 y.o. male with a history of cerebral palsy and spastic quadriplegia and initially admitted on 2/4 with a rash on his left lower leg thought to be cellulitis.  He reports having this occur about 2 times per year and had reported that he usually gets admitted for antibiotic treatment (none of his previous admissions in Epic are for cellulitis nor mention it).  He had initially seen his PCP who was concerned for DVT (inconclusive).  He was discharged on antibiotics.  He returned due to pain in his leg.      Review of Systems:  Constitutional: negative for fevers and chills All other systems reviewed and are negative    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HCC)    S/P Botox  injection    approx every 4 months   Seasonal allergies     Social History   Tobacco Use   Smoking status: Never   Smokeless tobacco: Former   Tobacco comments:    smoked one time  Vaping Use   Vaping status: Never Used  Substance Use Topics   Alcohol use: Yes    Comment: 2 beers every other Friday and Saturday   Drug use: No    Family History  Problem Relation Age of Onset   Hypertension Father    Lung cancer Father    Diabetes Mother     Allergies  Allergen Reactions   Penicillins  Hives, Nausea And Vomiting and Other (See Comments)    Patient tolerated cefazolin  in 2017   Sulfamethoxazole -Trimethoprim  Nausea And Vomiting   Metronidazole  Nausea And Vomiting    Physical Exam: Constitutional: in no apparent distress  Vitals:   04/25/23 1300 04/25/23 1451  BP: 114/87 (!) 95/59  Pulse: 79 69  Resp: (!) 23 16  Temp:  97.6 F (36.4 C)  SpO2: 95% 98%   EYES: anicteric Respiratory: normal respiratory effort Musculoskeletal: left leg with non-blanching erythematous area on leg   Lab Results  Component Value Date   WBC 5.4 04/25/2023   HGB 12.8 (L) 04/25/2023   HCT 38.9 (L) 04/25/2023   MCV 83.8 04/25/2023   PLT 168 04/25/2023    Lab Results  Component Value Date   CREATININE 1.10 04/25/2023   BUN 20 04/25/2023   NA 136  04/25/2023   K 3.6 04/25/2023   CL 99 04/25/2023   CO2 24 04/25/2023    Lab Results  Component Value Date   ALT 8 04/20/2023   AST 11 (L) 04/20/2023   ALKPHOS 84 04/20/2023     Microbiology: Recent Results (from the past 240 hours)  Blood culture (routine x 2)     Status: None (Preliminary result)   Collection Time: 04/25/23 12:43 AM   Specimen: BLOOD  Result Value Ref Range Status   Specimen Description BLOOD LEFT ANTECUBITAL  Final   Special Requests   Final    BOTTLES DRAWN AEROBIC AND ANAEROBIC Blood Culture results may not be optimal due to an inadequate volume of blood received in culture bottles   Culture   Final    NO GROWTH < 12 HOURS Performed at [facility], [address], [place], [place] [address]    Report Status PENDING  Incomplete  Blood culture (routine x 2)     Status: None (Preliminary result)   Collection Time: 04/25/23  1:36 AM   Specimen: BLOOD  Result Value Ref Range Status   Specimen Description BLOOD BLOOD LEFT HAND  Final   Special Requests   Final    BOTTLES DRAWN AEROBIC AND ANAEROBIC Blood Culture results may not be optimal due to an inadequate volume of blood received in culture bottles   Culture   Final    NO GROWTH < 12 HOURS Performed at [facility], [address], [place], [place] [address]    Report Status PENDING  Incomplete  MRSA Next Gen by PCR, Nasal     Status: None   Collection Time: 04/25/23 12:43 PM   Specimen: Nasal Swab  Result Value Ref Range Status   MRSA by PCR Next Gen NOT DETECTED NOT DETECTED Final    Comment: (NOTE) The GeneXpert MRSA Assay (FDA approved for NASAL specimens only), is one component of a comprehensive MRSA colonization surveillance program. It is not intended to diagnose MRSA infection nor to guide or monitor treatment for MRSA infections. Test performance is not FDA approved in patients less than 4 years old. Performed at [facility], [address] [place],  [place] [address]     Lamar LELON Bucks, MD [email or web address] 04/25/2023, 3:20 PM

## 2023-04-25 NOTE — Assessment & Plan Note (Addendum)
 His CHF is not exacerbated. Continue lasix  20 mg daily.

## 2023-04-25 NOTE — Assessment & Plan Note (Signed)
 Chronic.

## 2023-04-25 NOTE — Hospital Course (Signed)
 HPI: 53 year old Caucasian male with a history of congenital several palsy with spastic quadriparesis, is wheelchair-bound and a power wheelchair, history of type 2 diabetes, chronic diastolic heart failure, hypertension, chronic renal insufficiency, was recently discharged from hospital on 04/23/2023 for a left lower leg cellulitis.  Patient had been seen by infectious disease that time and was discharged to home on [place].  Patient comes back to the ER tonight complaining of continued pain in the left leg.  He denies any fever.  Unfortunately pictures of his left leg at discharge were not taken so I really do not know what his leg look like when he was discharged.  I only have pictures of his leg when he was admitted to the hospital on 04/20/2023.  Patient states that he has been taking his antibiotics as prescribed.  He has been wearing the TED hose that was prescribed to him.  He wore it on Friday and took it off Saturday due to increasing pain from the compression.  He states that the TED hoses very tight.  On arrival temp 97.7 heart rate 77 blood pressure 110/63  White count 5.4, hemoglobin of 12.8, platelets 168 Sodium 136, Tessman 3.6, bicarb 24, BUN of 20, creatinine 1.0  CRP of 1.0 sed rate is pending  There is last admission, he had a left lower extremity ultrasound that was negative for DVT.  EDP concerned about the continued erythema of the left leg.  He started on IV Rocephin  and vancomycin .  [facility] hospitalist consulted   Significant Events: Admitted 04/25/2023 left leg cellulitis   Significant Labs: White count 5.4, hemoglobin of 12.8, platelets 168 Sodium 136, Tessman 3.6, bicarb 24, BUN of 20, creatinine 1.0 CRP of 1.0 sed rate is pending  Significant Imaging Studies:   Antibiotic Therapy: Anti-infectives (From admission, onward)    Start     Dose/Rate Route Frequency Ordered Stop   04/25/23 0145  vancomycin  (VANCOCIN ) IVPB 1000 mg/200 mL premix  Status:  Discontinued         1,000 mg 200 mL/hr over 60 Minutes Intravenous  Once 04/25/23 0134 04/25/23 0136   04/25/23 0145  cefTRIAXone  (ROCEPHIN ) 1 g in sodium chloride  0.9 % 100 mL IVPB        1 g 200 mL/hr over 30 Minutes Intravenous  Once 04/25/23 0137 04/25/23 0236   04/25/23 0145  vancomycin  (VANCOREADY) IVPB 1750 mg/350 mL        1,750 mg 175 mL/hr over 120 Minutes Intravenous  Once 04/25/23 9862         Procedures:   Consultants:

## 2023-04-25 NOTE — H&P (Addendum)
 History and Physical    Thomas Ramos FMW:[id number] DOB: [birth year] DOA: 04/25/2023  DOS: the patient was seen and examined on 04/25/2023  PCP: Jacques Camie Pepper, PA-C   Patient coming from: Home  I have personally briefly reviewed patient's old medical records in [facility]  HPI: 53 year old Caucasian male with a history of congenital several palsy with spastic quadriparesis, is wheelchair-bound and a power wheelchair, history of type 2 diabetes, chronic diastolic heart failure, hypertension, chronic renal insufficiency, was recently discharged from hospital on 04/23/2023 for a left lower leg cellulitis.  Patient had been seen by infectious disease that time and was discharged to home on [place].  Patient comes back to the ER tonight complaining of continued pain in the left leg.  He denies any fever.  Unfortunately pictures of his left leg at discharge were not taken so I really do not know what his leg look like when he was discharged.  I only have pictures of his leg when he was admitted to the hospital on 04/20/2023.  Patient states that he has been taking his antibiotics as prescribed.  He has been wearing the TED hose that was prescribed to him.  He wore it on Friday and took it off Saturday due to increasing pain from the compression.  He states that the TED hoses very tight.  On arrival temp 97.7 heart rate 77 blood pressure 110/63  White count 5.4, hemoglobin of 12.8, platelets 168 Sodium 136, Tessman 3.6, bicarb 24, BUN of 20, creatinine 1.0  CRP of 1.0 sed rate is pending  There is last admission, he had a left lower extremity ultrasound that was negative for DVT.  EDP concerned about the continued erythema of the left leg.  He started on IV Rocephin  and vancomycin .  [facility] hospital[facility] consulted   Significant Events: Admitted 04/25/2023 left leg cellulitis   Significant Labs: White count 5.4, hemoglobin of 12.8, platelets 168 Sodium 136, Tessman 3.6, bicarb 24, BUN of  20, creatinine 1.0 CRP of 1.0 sed rate is pending  Significant Imaging Studies:   Antibiotic Therapy: Anti-infectives (From admission, onward)    Start     Dose/Rate Route Frequency Ordered Stop   04/25/23 0145  vancomycin  (VANCOCIN ) IVPB 1000 mg/200 mL premix  Status:  Discontinued        1,000 mg 200 mL/hr over 60 Minutes Intravenous  Once 04/25/23 0134 04/25/23 0136   04/25/23 0145  cefTRIAXone  (ROCEPHIN ) 1 g in sodium chloride  0.9 % 100 mL IVPB        1 g 200 mL/hr over 30 Minutes Intravenous  Once 04/25/23 0137 04/25/23 0236   04/25/23 0145  vancomycin  (VANCOREADY) IVPB 1750 mg/350 mL        1,750 mg 175 mL/hr over 120 Minutes Intravenous  Once 04/25/23 9862         Procedures:   Consultants:     ED Course: pt given IV rocephin harrel. Labs unremarkable for infection.  Review of Systems:  Review of Systems  Constitutional:  Negative for chills and fever.  HENT: Negative.    Eyes: Negative.   Respiratory: Negative.    Cardiovascular: Negative.   Gastrointestinal: Negative.   Genitourinary: Negative.   Musculoskeletal:        Pain in left leg while wearing ted hose.  Skin:        Erythema of left leg not improved Pain in left leg while wearing ted hose  Neurological: Negative.   Endo/Heme/Allergies: Negative.   Psychiatric/Behavioral: Negative.  All other systems reviewed and are negative.   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HCC)    S/P Botox  injection    approx every 4 months   Seasonal  allergies     Past Surgical History:  Procedure Laterality Date   BIOPSY  06/24/2021   Procedure: BIOPSY;  Surgeon: Legrand Victory LITTIE DOUGLAS, MD;  Location: [facility];  Service: Gastroenterology;;   BOTOX  INJECTION N/A 12/21/2012   Procedure: BOTOX  INJECTION;  Surgeon: Lamar JONETTA Aho, MD;  Location: [facility];  Service: Endoscopy;  Laterality: N/A;   BOTOX  INJECTION N/A 06/28/2014   Procedure: BOTOX  INJECTION;  Surgeon: Lamar JONETTA Aho, MD;  Location: [facility];  Service: Endoscopy;  Laterality: N/A;   COLONOSCOPY WITH PROPOFOL  N/A 06/24/2021   Procedure: COLONOSCOPY WITH PROPOFOL ;  Surgeon: Legrand Victory LITTIE DOUGLAS, MD;  Location: [facility];  Service: Gastroenterology;  Laterality: N/A;   ESOPHAGOGASTRODUODENOSCOPY N/A 12/21/2012   Procedure: ESOPHAGOGASTRODUODENOSCOPY (EGD);  Surgeon: Lamar JONETTA Aho, MD;  Location: [facility];  Service: Endoscopy;  Laterality: N/A;   ESOPHAGOGASTRODUODENOSCOPY N/A 06/28/2014   Procedure: ESOPHAGOGASTRODUODENOSCOPY (EGD);  Surgeon: Lamar JONETTA Aho, MD;  Location: [facility] END[facility]COPY;  Service: Endoscopy;  Laterality: N/A;   ESOPHAGOGASTRODUODENOSCOPY N/A 06/24/2021   Procedure: ESOPHAGOGASTRODUODENOSCOPY (EGD);  Surgeon: Legrand Victory LITTIE DOUGLAS, MD;  Location: [facility];  Service: Gastroenterology;  Laterality: N/A;   ESOPHAGOGASTRODUODENOSCOPY (EGD) WITH PROPOFOL  N/A 09/03/2017   Procedure: ESOPHAGOGASTRODUODENOSCOPY (EGD) WITH PROPOFOL ;  Surgeon: Legrand Victory LITTIE DOUGLAS, MD;  Location: [facility];  Service: Gastroenterology;  Laterality: N/A;   ESOPHAGUS SURGERY     stretched esophagus   legs     MOUTH SURGERY     POLYPECTOMY  06/24/2021   Procedure: POLYPECTOMY;  Surgeon: Legrand Victory LITTIE DOUGLAS, MD;  Location: [facility];  Service: Gastroenterology;;   TENDON RELEASE       reports that he has never smoked. He has quit using smokeless tobacco. He reports current alcohol use. He reports that he does not use drugs.  Allergies  Allergen Reactions   Penicillins Hives,  Nausea And Vomiting and Other (See Comments)    Patient tolerated cefazolin  in 2017   Sulfamethoxazole -Trimethoprim  Nausea And Vomiting   Metronidazole  Nausea And Vomiting    Family History  Problem Relation Age of Onset   Hypertension Father    Lung cancer Father    Diabetes Mother     Prior to Admission medications   Medication Sig Start Date End Date Taking? Authorizing Provider  acetaminophen  (TYLENOL ) 325 MG tablet Take 2 tablets (650 mg total) by mouth every 6 (six) hours as needed for mild pain (pain score 1-3), moderate pain (pain score 4-6) or fever (or Fever >/= 101). 04/23/23   Hongalgi, Anand D, MD  Albuterol  Sulfate (PROAIR  RESPICLICK) 108 (90 Base) MCG/ACT AEPB Inhale 2 puffs into the lungs every 6 (six) hours as needed (Shortness of breath). 05/03/20   [provider]  amLODipine  (NORVASC ) 10 MG tablet Take 5  mg by mouth daily. 12/07/22   [provider]  aspirin  81 MG chewable tablet Chew 81 mg by mouth daily. Patient not taking: Reported on 04/21/2023    [provider]  baclofen  (LIORESAL ) 10 MG tablet Take 2.5 mg by mouth at bedtime. 06/01/18   [provider]  cefadroxil  (DURICEF) 500 MG capsule Take 1 capsule (500 mg total) by mouth 2 (two) times daily for 11 doses. 04/23/23 04/29/23  Hongalgi, Anand D, MD  Dulaglutide 0.75 MG/0.5ML SOPN Inject 0.75 mg into the skin once a week. Wednesday 02/26/20   [provider]  furosemide  (LASIX ) 20 MG tablet Take 20 mg by mouth daily. 10/23/20   [provider]  latanoprost  (XALATAN ) 0.005 % ophthalmic solution Place 1 drop into both eyes at bedtime. 08/25/21   [provider]  metoprolol  succinate (TOPROL -XL) 25 MG 24 hr tablet Take 25 mg by mouth daily. 02/13/21   [provider]  montelukast  (SINGULAIR ) 10 MG tablet Take 1 tablet by mouth daily. 07/28/21   [provider]  nitroGLYCERIN  (NITROSTAT ) 0.4 MG SL tablet Place 0.4 mg under the tongue every 5 (five)  minutes as needed for chest pain. Patient not taking: Reported on 04/21/2023    [provider]  omeprazole  (PRILOSEC) 20 MG capsule Take 1 capsule by mouth every morning. 12/12/19   [provider]  rosuvastatin  (CRESTOR ) 5 MG tablet Take 5 mg by mouth at bedtime.    [provider]  timolol  (TIMOPTIC ) 0.5 % ophthalmic solution Place 1 drop into both eyes 2 (two) times daily. 04/10/20   [provider]  valsartan (DIOVAN) 80 MG tablet Take 80 mg by mouth daily.    [provider]    Physical Exam: Vitals:   04/25/23 0017 04/25/23 0022  BP: 110/63   Pulse: 77   Resp: 18   Temp: 97.7 F (36.5 C)   TempSrc: Oral   SpO2:  100%    Physical Exam Vitals and nursing note reviewed.  Constitutional:      Comments: Chronically ill Caucasian male.  No distress.  Chronic flexion contractures of his elbows, shoulders, hips, knees, ankles.  HENT:     Nose: Nose normal.  Cardiovascular:     Rate and Rhythm: Normal rate and regular rhythm.  Pulmonary:     Effort: Pulmonary effort is normal.     Breath sounds: Normal breath sounds.  Abdominal:     General: Bowel sounds are normal.     Palpations: Abdomen is soft.  Skin:    Capillary Refill: Capillary refill takes less than 2 seconds.     Comments: See pictures of his left leg erythema.  There is no overt drainage.  It actually looks like bruising rather than cellulitis.  In comparison to pictures from April 20, 2023, the dark purpleish bruising looks improved.  There is certainly less swelling.  Neurological:     Mental Status: He is oriented to person, place, and time.         Labs on Admission: I have personally reviewed following labs and imaging studies  CBC: Recent Labs  Lab 04/20/23 1825 04/21/23 0457 04/22/23 0644 04/23/23 0819 04/25/23 0134  WBC 5.2 3.7* 3.9* 4.1 5.4  NEUTROABS 3.6  --   --   --  3.3  HGB 12.6* 11.6* 11.8* 12.3* 12.8*  HCT 37.6* 35.0* 34.5* 36.4* 38.9*   MCV 84.1 84.1 82.3 82.2 83.8  PLT 163 144* 137* 141* 168   Basic Metabolic Panel: Recent  Labs  Lab 04/20/23 1825 04/21/23 0457 04/22/23 0644 04/25/23 0134  NA 140 138 137 136  K 3.2* 3.4* 4.0 3.6  CL 103 103 105 99  CO2 25 24 24 24   GLUCOSE 133* 105* 109* 118*  BUN 15 13 15 20   CREATININE 0.92 0.77 0.93 1.10  CALCIUM  9.0 8.5* 8.8* 9.0  MG  --  2.0  --   --    GFR: Estimated Creatinine Clearance: 81.6 mL/min (by C-G formula based on SCr of 1.1 mg/dL). Liver Function Tests: Recent Labs  Lab 04/20/23 1825  AST 11*  ALT 8  ALKPHOS 84  BILITOT 0.2  PROT 6.1*  ALBUMIN 3.5   BNP (last 3 results) Recent Labs    07/13/22 1953  BNP 38.1   CBG: Recent Labs  Lab 04/22/23 1542 04/22/23 2108 04/23/23 0752 04/23/23 1139 04/23/23 1701  GLUCAP 196* 188* 106* 125* 158*   Radiological Exams on Admission: I have personally reviewed images No results found.  EKG: My personal interpretation of EKG shows: no EKG to review.  Assessment/Plan Principal Problem:   Left leg cellulitis Active Problems:   Type 2 diabetes mellitus with complication, without long-term current use of insulin  (HCC)   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HCC)   GERD (gastroesophageal reflux disease)   Chronic venous insufficiency   Chronic diastolic (congestive) heart failure (HCC)   Essential hypertension   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HCC)    Assessment and Plan: * Left leg cellulitis Based on available pictures, pt's bruising and edema have improved. Normal WBC. It does not look like MRSA infection. Will check MRSA screen. I don't think his cellulitis is any worse. Discussed with pt that it would take several days of Po abx for the cellulitis to improve. Check CRP, ESR. Send ASO titer. Pt given IV rocephin  and Vanco.  Could add some po bactrim  if community-acquired MRSA is a consideration.  Will try and have the discharging physician from 04-23-2023 look at his leg to see if it any worse.  If not worse,  pt can go home.  Pt stated that the TED hose he was wearing in his left leg was too tight and causing some pain. I placed an ACE bandage to give compression and it will not be a tight as an TED hose.  Prn tylenol  for mild pain. Prn ultram  for moderate/severe pain.  Continue with duricef. Can consider addition of bactrim  if MRSA screen is positive.  It would be helpful to include a picture at discharge of his left lower leg in case pt comes back to the ER in order to compare with any future complaints of his left leg.  04-20-2023 04-25-2023          Type 2 diabetes mellitus with complication, without long-term current use of insulin  (HCC) Stable. SSI.  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HCC) Chronic.  Essential hypertension Stable. At prior discharge on 04-23-2023, his diovan, toprol -xl, norvasc  were held until he could be seen by PCP.  Chronic diastolic (congestive) heart failure (HCC) His CHF is not exacerbated. Continue lasix  20 mg daily.  Chronic venous insufficiency Continue with ACE bandage to left lower leg for compression and to help with edema.  GERD (gastroesophageal reflux disease) Continue PPI.  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HCC) Chronic. Stable. Pt is in a power wheelchair. Has a live-in caregiver.   DVT prophylaxis: SQ Heparin  Code Status: Full Code Family Communication: no family at bedside  Disposition Plan: return home  Consults called: none  Admission status: Observation, Med-Surg  Camellia Door, DO [facility] 04/25/2023, 3:29 AM

## 2023-04-25 NOTE — Assessment & Plan Note (Signed)
 Chronic. Stable. Pt is in a power wheelchair. Has a live-in caregiver.

## 2023-04-26 ENCOUNTER — Other Ambulatory Visit (HOSPITAL_COMMUNITY): Payer: Self-pay

## 2023-04-26 DIAGNOSIS — R21 Rash and other nonspecific skin eruption: Secondary | ICD-10-CM | POA: Diagnosis not present

## 2023-04-26 DIAGNOSIS — L03116 Cellulitis of left lower limb: Secondary | ICD-10-CM | POA: Diagnosis not present

## 2023-04-26 LAB — COMPREHENSIVE METABOLIC PANEL
ALT: 11 U/L (ref 0–44)
AST: 11 U/L — ABNORMAL LOW (ref 15–41)
Albumin: 2.9 g/dL — ABNORMAL LOW (ref 3.5–5.0)
Alkaline Phosphatase: 66 U/L (ref 38–126)
Anion gap: 14 (ref 5–15)
BUN: 19 mg/dL (ref 6–20)
CO2: 25 mmol/L (ref 22–32)
Calcium: 9.1 mg/dL (ref 8.9–10.3)
Chloride: 100 mmol/L (ref 98–111)
Creatinine, Ser: 1.06 mg/dL (ref 0.61–1.24)
GFR, Estimated: >60 mL/min (ref >60)
Glucose, Bld: 118 mg/dL — ABNORMAL HIGH (ref 70–99)
Potassium: 4.4 mmol/L (ref 3.5–5.1)
Sodium: 139 mmol/L (ref 135–145)
Total Bilirubin: 0.4 mg/dL (ref 0.0–1.2)
Total Protein: 5 g/dL — ABNORMAL LOW (ref 6.5–8.1)

## 2023-04-26 LAB — CBC
HCT: 31.5 % — ABNORMAL LOW (ref 39.0–52.0)
Hemoglobin: 10.4 g/dL — ABNORMAL LOW (ref 13.0–17.0)
MCH: 27.8 pg (ref 26.0–34.0)
MCHC: 33 g/dL (ref 30.0–36.0)
MCV: 84.2 fL (ref 80.0–100.0)
Platelets: 120 10*3/uL — ABNORMAL LOW (ref 150–400)
RBC: 3.74 MIL/uL — ABNORMAL LOW (ref 4.22–5.81)
RDW: 12.4 % (ref 11.5–15.5)
WBC: 3.1 10*3/uL — ABNORMAL LOW (ref 4.0–10.5)
nRBC: 0 % (ref 0.0–0.2)

## 2023-04-26 LAB — LACTIC ACID, PLASMA: Lactic Acid, Venous: 1.2 mmol/L (ref 0.5–1.9)

## 2023-04-26 LAB — GLUCOSE, CAPILLARY
Glucose-Capillary: 86 mg/dL (ref 70–99)
Glucose-Capillary: 86 mg/dL (ref 70–99)
Glucose-Capillary: 109 mg/dL — ABNORMAL HIGH (ref 70–99)

## 2023-04-26 LAB — HEPATITIS C ANTIBODY: HCV Ab: NONREACTIVE

## 2023-04-26 MED ORDER — LACTATED RINGERS IV BOLUS
1000.0000 mL | Freq: Once | INTRAVENOUS | Status: AC
  Administered 2023-04-26: 1000 mL via INTRAVENOUS

## 2023-04-26 MED ORDER — TRAMADOL HCL 50 MG PO TABS
50.0000 mg | ORAL_TABLET | Freq: Two times a day (BID) | ORAL | 0 refills | Status: DC | PRN
  Filled 2023-04-26: qty 6, 3d supply, fill #0

## 2023-04-26 MED ORDER — FUROSEMIDE 40 MG PO TABS: 20.0000 mg | ORAL_TABLET | Freq: Every day | ORAL | Status: DC

## 2023-04-26 MED ORDER — TRAMADOL HCL 50 MG PO TABS: 50.0000 mg | ORAL_TABLET | Freq: Two times a day (BID) | ORAL | 0 refills | Status: DC | PRN

## 2023-04-26 NOTE — Progress Notes (Signed)
 Pt given AVS. No questions at this time. TOC meds given. IV removed. [facility] transported pt home.

## 2023-04-26 NOTE — Discharge Summary (Addendum)
 Physician Discharge Summary  Thomas Ramos DGL:[id number] DOB: November 09, 1970  PCP: Jearlean Mince, PA-C  Admitted from: Home Discharged to: Home  Admit date: 04/25/2023 Discharge date: 04/26/2023  Recommendations for Outpatient Follow-up:    Follow-up Information     Jearlean Mince, PA-C. Schedule an appointment as soon as possible for a visit in 3 day(s).   Specialty: Physician Assistant Why: Hospital Follow Up.  To be seen with repeat labs (CBC & BMP).  Recommend outpatient Dermatology consultation for evaluation of bilateral leg rash concerning for vasculitis. Contact information: [address] [place] [address] [phone]         Dermatology, Jonette Nestle. Schedule an appointment as soon as possible for a visit.   Contact information: [address] [place] [address] [phone]                  Home Health: None    Equipment/Devices: None    Discharge Condition: Improved and stable.   Code Status: Full Code Diet recommendation:  Discharge Diet Orders (From admission, onward)     Start     Ordered   04/26/23 0000  Diet - low sodium heart healthy        04/26/23 1400             Discharge Diagnoses:  Principal Problem:   Left leg cellulitis Active Problems:   Type 2 diabetes mellitus with complication, without long-term current use of insulin  (HCC)   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HCC)   GERD (gastroesophageal reflux disease)   Chronic venous insufficiency   Chronic diastolic (congestive) heart failure (HCC)   Essential hypertension   Quadriplegic spinal paralysis [facility])   Brief Hospital Course:  53 year old male, reportedly lives alone but has a caregiver, moves around with the help of a wheelchair, medical history significant for hypertension, hyperlipidemia, type 2 diabetes mellitus, chronic HFpEF, cerebral palsy and spastic quadriplegia, hospitalized 04/20/2023-04/23/2023 for left leg cellulitis, treated  with 2 days of IV ceftriaxone , infectious disease consulted on day of discharge, cellulitis was improving and patient was discharged home on cefadroxil  to complete total 7 days course of antibiotics.  Left leg compression stocking was placed on day of discharge to address underlying edema to promote further healing.  Patient then returned to the ED on 2/8 night with complaints of severe left leg pain.     Assessment & Plan:    Left leg cellulitis This is a recurrent issue.  Please refer to detailed discharge summary by this MD on 2/7 It appears that his left leg pain may be more due to the compression stocking placed on day of DC. The cellulitis itself has improved or even resolved.  He does have some residual skin changes/bruising or rash of unclear etiology.  In the ED, the compression stocking was taken down and Ace wrap was placed with significant improvement in his pain and symptoms.   He has no fever or leukocytosis, CRP of 1 and ESR of 16 also argues against ongoing infection.  MRSA PCR negative.  Blood cultures x 2: Negative to date. ID consultation 2/9 appreciated.  They did not think that this was cellulitis.  They felt that the rash had a vasculitic appearance (similar to ID consultation on 2/7 when they also were questioning underlying vasculitis).  Yesterday ID saw no indication for antibiotics and recommended considering outpatient referral to dermatology for evaluation and possible biopsy.  They added hepatitis C to prior labs. Patient has completed approximately 6  days of IV/p.o. antibiotics since 2/5 and discontinued at time of this discharge. Continue multimodality pain control including tramadol  for a brief period.  Now that the TED hose/compression stocking is off, pain is better.  Continue Ace wrap to minimize edema but this should not be wrapped tightly. Patient himself indicates that every time he has an infection, he develops skin discoloration as he currently has. Since the rash  on his left leg and now newly noted rash on his right leg albeit smaller, concerning for vasculitic rash, recommend outpatient dermatology consultation for further evaluation.  Unfortunately we do not have an inpatient dermatology service.  PCP to arrange this consultation.  I have added [facility] dermatology contact details for patient or family to arrange as well.  Hypotension/essential hypertension ??  Chronic During recent hospitalization and discharge, he had soft blood pressures with SBP mostly in the 90s and all 3 of his antihypertensives noted below were held until close outpatient follow-up with his PCP Antihypertensives were not resumed during this admission either. Overnight 2/9, had several blood pressures with SBP's in the 80s, reportedly confirmed by manual blood pressure checks but patient was asymptomatic.  He was bolused with IV fluids. This morning, patient was asymptomatic of dizziness, lightheadedness, chest pain, dyspnea and apart from his leg symptoms, had no other complaints.  Personally confirmed BP on the left upper arm using a manual cuff, 104/60.  Unable to check BP on right upper arm due to contractures.  Recheck BP at noon today was 109/77.  Clinically appears euvolemic.  Lactate 1.2.  Held Lasix  for today and can be resumed from tomorrow at home.  CMP unremarkable.   Continue to hold Norvasc , metoprolol , valsartan.     Chronic HFpEF  Appears compensated except trace leg edema, left more so.  The left leg edema may be more ?inflammatory than related to heart failure. Continue Lasix .  Held metoprolol  and valsartan due to soft blood pressures.   Type II DM  A1c was 6.6% in July 2022  Mildly uncontrolled and fluctuating in the hospital while on [employer] Home Dulaglutide on hold.   Cerebral palsy; spastic quadriplegia  Continue baclofen , supportive care    Mild pancytopenia Unclear etiology.  On yesterday's lab, this had resolved but again has mild pancytopenia today.   Some of it may be dilutional.  No bleeding reported.  Follow CBC closely as outpatient.         Consultants:   Infectious disease   Procedures:         Discharge Instructions  Discharge Instructions     (HEART FAILURE PATIENTS) Call MD:  Anytime you have any of the following symptoms: 1) 3 pound weight gain in 24 hours or 5 pounds in 1 week 2) shortness of breath, with or without a dry hacking cough 3) swelling in the hands, feet or stomach 4) if you have to sleep on extra pillows at night in order to breathe.   Complete by: As directed    Call MD for:  difficulty breathing, headache or visual disturbances   Complete by: As directed    Call MD for:  extreme fatigue   Complete by: As directed    Call MD for:  persistant dizziness or light-headedness   Complete by: As directed    Call MD for:  persistant nausea and vomiting   Complete by: As directed    Call MD for:  redness, tenderness, or signs of infection (pain, swelling, redness, odor or green/yellow discharge around incision  site)   Complete by: As directed    Call MD for:  severe uncontrolled pain   Complete by: As directed    Call MD for:  temperature >100.4   Complete by: As directed    Diet - low sodium heart healthy   Complete by: As directed    Discharge instructions   Complete by: As directed    Recommend ACE wrap dressing to left leg to help with edema/swelling.  This can be changed daily.  This should not be wrapped too tightly.   Increase activity slowly   Complete by: As directed         Medication List     PAUSE taking these medications    amLODipine  10 MG tablet Wait to take this until your doctor or other care provider tells you to start again. Commonly known as: NORVASC  Take 5 mg by mouth daily.   metoprolol  succinate 25 MG 24 hr tablet Wait to take this until your doctor or other care provider tells you to start again. Commonly known as: TOPROL -XL Take 25 mg by mouth daily.   valsartan 80  MG tablet Wait to take this until your doctor or other care provider tells you to start again. Commonly known as: DIOVAN Take 80 mg by mouth daily.       STOP taking these medications    cefadroxil  500 MG capsule Commonly known as: DURICEF       TAKE these medications    acetaminophen  325 MG tablet Commonly known as: TYLENOL  Take 2 tablets (650 mg total) by mouth every 6 (six) hours as needed for mild pain (pain score 1-3), moderate pain (pain score 4-6) or fever (or Fever >/= 101).   aspirin  81 MG chewable tablet Chew 81 mg by mouth daily.   baclofen  10 MG tablet Commonly known as: LIORESAL  Take 2.5 mg by mouth at bedtime.   Dulaglutide 0.75 MG/0.5ML Soaj Inject 0.75 mg into the skin once a week. Wednesday   furosemide  20 MG tablet Commonly known as: LASIX  Take 20 mg by mouth daily.   latanoprost  0.005 % ophthalmic solution Commonly known as: XALATAN  Place 1 drop into both eyes at bedtime.   montelukast  10 MG tablet Commonly known as: SINGULAIR  Take 1 tablet by mouth daily.   nitroGLYCERIN  0.4 MG SL tablet Commonly known as: NITROSTAT  Place 0.4 mg under the tongue every 5 (five) minutes as needed for chest pain.   omeprazole  20 MG capsule Commonly known as: PRILOSEC Take 1 capsule by mouth every morning.   ProAir  RespiClick 108 (90 Base) MCG/ACT Aepb Generic drug: Albuterol  Sulfate Inhale 2 puffs into the lungs every 6 (six) hours as needed (Shortness of breath).   rosuvastatin  5 MG tablet Commonly known as: CRESTOR  Take 5 mg by mouth at bedtime.   timolol  0.5 % ophthalmic solution Commonly known as: TIMOPTIC  Place 1 drop into both eyes 2 (two) times daily.   traMADol  50 MG tablet Commonly known as: ULTRAM  Take 1 tablet (50 mg total) by mouth every 12 (twelve) hours as needed for severe pain (pain score 7-10).       Allergies  Allergen Reactions   Penicillins Hives, Nausea And Vomiting and Other (See Comments)    Patient tolerated cefazolin   in 2017   Sulfamethoxazole -Trimethoprim  Nausea And Vomiting   Metronidazole  Nausea And Vomiting      Procedures/Studies: VAS US  LOWER EXTREMITY VENOUS (DVT) (7a-7p) Result Date: 04/21/2023  Lower Venous DVT Study Patient Name:  Donyel C Amadon  Date of Exam:   04/20/2023 Medical Rec #: [id number]      Accession #:    [id number] Date of Birth: 1971/02/07       Patient Gender: M Patient Age:   57 years Exam Location:  [facility] Procedure:      VAS US  LOWER EXTREMITY VENOUS (DVT) Referring Phys: Rexie Catena --------------------------------------------------------------------------------  Indications: Erythema, and Swelling.  Limitations: Patient with MS (severely contracted) and poor ultrasound/tissue interface. Comparison Study: Patient has had several previous exams, all undiagnostic due                   to above stated limitations. Performing Technologist: Arlyce Berger RVT, RDMS  Examination Guidelines: A complete evaluation includes B-mode imaging, spectral Doppler, color Doppler, and power Doppler as needed of all accessible portions of each vessel. Bilateral testing is considered an integral part of a complete examination. Limited examinations for reoccurring indications may be performed as noted. The reflux portion of the exam is performed with the patient in reverse Trendelenburg.  +-----+---------------+---------+-----------+----------+----------------------+ RIGHTCompressibilityPhasicitySpontaneityPropertiesThrombus Aging         +-----+---------------+---------+-----------+----------+----------------------+ CFV                 Yes      Yes                  patent by color and                                                      doppler                +-----+---------------+---------+-----------+----------+----------------------+   +--------+---------------+---------+-----------+----------+--------------------+ LEFT     CompressibilityPhasicitySpontaneityPropertiesThrombus Aging       +--------+---------------+---------+-----------+----------+--------------------+ CFV                    Yes      Yes                  patent by                                                                 color/doppler        +--------+---------------+---------+-----------+----------+--------------------+ SFJ                                                  unable to visualized +--------+---------------+---------+-----------+----------+--------------------+ FV Prox                                              unable to visualized +--------+---------------+---------+-----------+----------+--------------------+ FV Mid                 Yes      Yes                  patent by  color/doppler        +--------+---------------+---------+-----------+----------+--------------------+ FV                                                   unable to visualized Distal                                                                    +--------+---------------+---------+-----------+----------+--------------------+ PFV                                                  unable to visualized +--------+---------------+---------+-----------+----------+--------------------+ POP     Full           Yes      Yes                                       +--------+---------------+---------+-----------+----------+--------------------+ PTV                                                  unable to visualized +--------+---------------+---------+-----------+----------+--------------------+ PERO                                                 unable to visualized +--------+---------------+---------+-----------+----------+--------------------+     Summary: RIGHT: - No evidence of common femoral vein obstruction.  LEFT: - Mostly  inconclusive study due to above stated limitations. Limited visualization, no evidence of DVT in areas imaged. - No cystic structure found in the popliteal fossa.  *See table(s) above for measurements and observations. Electronically signed by Delaney Fearing on 04/21/2023 at 5:38:34 PM.    Final       Subjective: Patient evaluated along with his RN in the room.  Patient reports that ever since they took off the compression stocking in the ED, his pain has significantly improved.  Repeatedly asked him how his left leg pain was and he kept saying "I am okay" and that his pain was better since the stocking was taken off.  Night RN noted a rash on the right lower lower leg which patient did not know about and has no complaints of IV pain etc.  Discharge Exam:  Vitals:   04/26/23 0346 04/26/23 0724 04/26/23 0900 04/26/23 1215  BP: 90/76 (!) 97/51 104/60 109/77  Pulse: (!) 53 92  82  Resp: 18 16  16   Temp: 97.6 F (36.4 C) (!) 97.5 F (36.4 C)  97.6 F (36.4 C)  TempSrc:  Oral    SpO2: 100% 100%  100%    General exam: Young male, small built and moderately nourished, lying comfortably supine in bed.  Oral mucosa moist.  Well-looking, does not  look septic or toxic. Respiratory system: Clear to auscultation. Respiratory effort normal. Cardiovascular system: S1 & S2 heard, RRR. No JVD, murmurs, rubs, gallops or clicks. No pedal edema. Gastrointestinal system: Abdomen is nondistended, soft and nontender. No organomegaly or masses felt. Normal bowel sounds heard. Central nervous system: Alert and oriented. No focal neurological deficits. Extremities: Right upper extremity and bilateral lower extremity significant contractures. Right upper extremity at least grade 3 x 5 power. Left upper extremity graded 4+ by 5 power. Bilateral lower extremities grade 2 x 5 power.  Compared to recent discharge, left leg is less swollen, no increased warmth or tenderness.  No clear erythema.  No fluctuance or crepitus.?   Bruising or rash noted on left anterior lower leg as in pictures below, some areas of blackish discoloration and other areas dark red but no areas of erythema around the rash itself.  No change in this rash compared to yesterday.  Right lower leg darkish rash as noted in the picture below.  No inflammatory findings. Skin: No rashes, lesions or ulcers Psychiatry: Judgement and insight appear normal. Mood & affect appropriate.    Picture from 04/20/2023 from previous admission      Pictures from 04/25/2023, L leg       R leg rash 04/26/2023   The results of significant diagnostics from this hospitalization (including imaging, microbiology, ancillary and laboratory) are listed below for reference.     Microbiology: Recent Results (from the past 240 hours)  Blood culture (routine x 2)     Status: None (Preliminary result)   Collection Time: 04/25/23 12:43 AM   Specimen: BLOOD  Result Value Ref Range Status   Specimen Description BLOOD LEFT ANTECUBITAL  Final   Special Requests   Final    BOTTLES DRAWN AEROBIC AND ANAEROBIC Blood Culture results may not be optimal due to an inadequate volume of blood received in culture bottles   Culture   Final    NO GROWTH < 12 HOURS Performed at [facility], [address], [place], [place] [address]    Report Status PENDING  Incomplete  Blood culture (routine x 2)     Status: None (Preliminary result)   Collection Time: 04/25/23  1:36 AM   Specimen: BLOOD  Result Value Ref Range Status   Specimen Description BLOOD BLOOD LEFT HAND  Final   Special Requests   Final    BOTTLES DRAWN AEROBIC AND ANAEROBIC Blood Culture results may not be optimal due to an inadequate volume of blood received in culture bottles   Culture   Final    NO GROWTH < 12 HOURS Performed at [facility], [address], [place], [place] [address]    Report Status PENDING  Incomplete  MRSA Next Gen by PCR, Nasal     Status: None   Collection Time: 04/25/23 12:43  PM   Specimen: Nasal Swab  Result Value Ref Range Status   MRSA by PCR Next Gen NOT DETECTED NOT DETECTED Final    Comment: (NOTE) The GeneXpert MRSA Assay (FDA approved for NASAL specimens only), is one component of a comprehensive MRSA colonization surveillance program. It is not intended to diagnose MRSA infection nor to guide or monitor treatment for MRSA infections. Test performance is not FDA approved in patients less than 15 years old. Performed at [facility], [address], [place], [place] [address]      Labs: CBC: Recent Labs  Lab 04/20/23 1825 04/21/23 [phone] 04/22/23 [phone]  04/23/23 0819 04/25/23 0134 04/26/23 0440  WBC 5.2 3.7* 3.9* 4.1 5.4 3.1*  NEUTROABS 3.6  --   --   --  3.3  --   HGB 12.6* 11.6* 11.8* 12.3* 12.8* 10.4*  HCT 37.6* 35.0* 34.5* 36.4* 38.9* 31.5*  MCV 84.1 84.1 82.3 82.2 83.8 84.2  PLT 163 144* 137* 141* 168 120*    Basic Metabolic Panel: Recent Labs  Lab 04/20/23 1825 04/21/23 0457 04/22/23 0644 04/25/23 0134 04/26/23 0440  NA 140 138 137 136 139  K 3.2* 3.4* 4.0 3.6 4.4  CL 103 103 105 99 100  CO2 25 24 24 24 25   GLUCOSE 133* 105* 109* 118* 118*  BUN 15 13 15 20 19   CREATININE 0.92 0.77 0.93 1.10 1.06  CALCIUM  9.0 8.5* 8.8* 9.0 9.1  MG  --  2.0  --   --   --     Liver Function Tests: Recent Labs  Lab 04/20/23 1825 04/26/23 0440  AST 11* 11*  ALT 8 11  ALKPHOS 84 66  BILITOT 0.2 0.4  PROT 6.1* 5.0*  ALBUMIN 3.5 2.9*    CBG: Recent Labs  Lab 04/25/23 1130 04/25/23 1545 04/25/23 2027 04/26/23 0728 04/26/23 1132  GLUCAP 168* 86 134* 86 109*   Discussed with patient's friend listed in his chart under emergency contact, updated care and answered all questions.  I called and discussed in detail with patient's PCP Ms. Terrye Fiedler via phone, updated care.  She will closely follow-up outpatient and try to arrange an office visit midweek.   Time coordinating discharge: 25 minutes  SIGNED:  Aubrey Blas,  MD,  FACP, [facility], [facility], [facility]   [facility] [place]     To contact the attending provider between 7A-7P or the covering provider during after hours 7P-7A, please log into the web site [email or web address] and access using universal [place] password for that web site. If you do not have the password, please call the hospital operator.

## 2023-04-26 NOTE — Progress Notes (Addendum)
 Pt has wound on RLE, present upon admission. Unsure type of wound, appears to be a burn or abrasion. Pt unsure about wound. Denied any pain. Notified Attending and dayshift nurse. Updated LDA.    Over the course of night shift pt developed hypotension. Asymptomatic. Dr. Pandora Bogaert. 2L boluses given and labs ordered.

## 2023-04-26 NOTE — TOC Transition Note (Signed)
 Transition of Care [facility]) - Discharge Note   Patient Details  Name: Thomas Ramos MRN: [id number] Date of Birth: 1970/06/25  Transition of Care [facility]) CM/SW Contact:  Jeani Mill, RN Phone Number: 04/26/2023, 3:33 PM   Clinical Narrative:    Patient stable for discharge.  Patient has key to house and need [facility] transport home.  [facility] called to transport patient.  No other TOC needs.      Final next level of care: Home/Self Care Barriers to Discharge: Barriers Resolved   Patient Goals and CMS Choice    Return home        Discharge Placement             home          Discharge Plan and Services Additional resources added to the After Visit Summary for                                       Social Drivers of Health (SDOH) Interventions SDOH Screenings   Food Insecurity: No Food Insecurity (04/21/2023)  Housing: Unknown (04/21/2023)  Transportation Needs: No Transportation Needs (04/21/2023)  Utilities: Not At Risk (04/21/2023)  Financial Resource Strain: Low Risk  (04/20/2023)   Received from [facility]  Physical Activity: Unknown (03/31/2022)   Received from [facility], [facility]  Social Connections: Moderately Integrated (03/31/2022)   [facility]  Stress: No Stress Concern Present (03/31/2022)   [facility] from [facility]  Tobacco Use: Medium Risk (04/25/2023)     Readmission Risk Interventions    07/15/2022    2:11 PM  Readmission Risk Prevention Plan  Post Dischage Appt Complete  Medication Screening Complete  Transportation Screening Complete

## 2023-04-26 NOTE — Plan of Care (Signed)
 Discharge instructions discussed with patient.  Patient instructed on home medications, restrictions, and follow up appointments. Belongings gathered and sent with patient.  Patients medications given to patient from [facility]   Patient discharged via [facility].

## 2023-04-26 NOTE — Discharge Instructions (Addendum)

## 2023-04-26 NOTE — Progress Notes (Addendum)
 Patient has become hypotensive. He has no new complaints. Plan to give IVF, repeat basic labs and check lactate.

## 2023-04-28 LAB — ANTISTREPTOLYSIN O TITER: ASO: 102 [IU]/mL (ref 0.0–200.0)

## 2023-04-29 ENCOUNTER — Other Ambulatory Visit: Payer: Self-pay

## 2023-04-29 ENCOUNTER — Emergency Department (HOSPITAL_BASED_OUTPATIENT_CLINIC_OR_DEPARTMENT_OTHER): Payer: Medicare Other

## 2023-04-29 ENCOUNTER — Emergency Department (HOSPITAL_COMMUNITY): Payer: Medicare Other

## 2023-04-29 ENCOUNTER — Emergency Department (HOSPITAL_COMMUNITY)
Admission: EM | Admit: 2023-04-29 | Discharge: 2023-04-29 | Disposition: A | Discharge status: Home / Self Care | Payer: Medicare Other | Attending: Emergency Medicine | Admitting: Emergency Medicine

## 2023-04-29 DIAGNOSIS — I110 Hypertensive heart disease with heart failure: Secondary | ICD-10-CM | POA: Insufficient documentation

## 2023-04-29 DIAGNOSIS — Z7982 Long term (current) use of aspirin: Secondary | ICD-10-CM | POA: Diagnosis not present

## 2023-04-29 DIAGNOSIS — I5030 Unspecified diastolic (congestive) heart failure: Secondary | ICD-10-CM | POA: Diagnosis not present

## 2023-04-29 DIAGNOSIS — M79661 Pain in right lower leg: Secondary | ICD-10-CM | POA: Diagnosis present

## 2023-04-29 DIAGNOSIS — E119 Type 2 diabetes mellitus without complications: Secondary | ICD-10-CM | POA: Diagnosis not present

## 2023-04-29 DIAGNOSIS — M79604 Pain in right leg: Secondary | ICD-10-CM

## 2023-04-29 DIAGNOSIS — Z79899 Other long term (current) drug therapy: Secondary | ICD-10-CM | POA: Diagnosis not present

## 2023-04-29 LAB — CBC WITH DIFFERENTIAL/PLATELET
Abs Immature Granulocytes: 0.01 10*3/uL (ref 0.00–0.07)
Basophils Absolute: 0 10*3/uL (ref 0.0–0.1)
Basophils Relative: 0 %
Eosinophils Absolute: 0.1 10*3/uL (ref 0.0–0.5)
Eosinophils Relative: 2 %
HCT: 40.6 % (ref 39.0–52.0)
Hemoglobin: 12.8 g/dL — ABNORMAL LOW (ref 13.0–17.0)
Immature Granulocytes: 0 %
Lymphocytes Relative: 20 %
Lymphs Abs: 0.8 10*3/uL (ref 0.7–4.0)
MCH: 27.3 pg (ref 26.0–34.0)
MCHC: 31.5 g/dL (ref 30.0–36.0)
MCV: 86.6 fL (ref 80.0–100.0)
Monocytes Absolute: 0.3 10*3/uL (ref 0.1–1.0)
Monocytes Relative: 7 %
Neutro Abs: 2.9 10*3/uL (ref 1.7–7.7)
Neutrophils Relative %: 71 %
Platelets: 163 10*3/uL (ref 150–400)
RBC: 4.69 MIL/uL (ref 4.22–5.81)
RDW: 12.5 % (ref 11.5–15.5)
WBC: 4 10*3/uL (ref 4.0–10.5)
nRBC: 0 % (ref 0.0–0.2)

## 2023-04-29 LAB — COMPREHENSIVE METABOLIC PANEL
ALT: 11 U/L (ref 0–44)
AST: 14 U/L — ABNORMAL LOW (ref 15–41)
Albumin: 4.1 g/dL (ref 3.5–5.0)
Alkaline Phosphatase: 89 U/L (ref 38–126)
Anion gap: 9 (ref 5–15)
BUN: 19 mg/dL (ref 6–20)
CO2: 27 mmol/L (ref 22–32)
Calcium: 9.5 mg/dL (ref 8.9–10.3)
Chloride: 102 mmol/L (ref 98–111)
Creatinine, Ser: 0.93 mg/dL (ref 0.61–1.24)
GFR, Estimated: >60 mL/min (ref >60)
Glucose, Bld: 115 mg/dL — ABNORMAL HIGH (ref 70–99)
Potassium: 4 mmol/L (ref 3.5–5.1)
Sodium: 138 mmol/L (ref 135–145)
Total Bilirubin: 0.5 mg/dL (ref 0.0–1.2)
Total Protein: 7.3 g/dL (ref 6.5–8.1)

## 2023-04-29 MED ORDER — IBUPROFEN 200 MG PO TABS
600.0000 mg | ORAL_TABLET | Freq: Once | ORAL | Status: AC
  Administered 2023-04-29: 600 mg via ORAL
  Filled 2023-04-29: qty 3

## 2023-04-29 NOTE — Progress Notes (Signed)
Lower extremity venous duplex completed. Please see CV Procedures for preliminary results.  Shona Simpson, RVT 04/29/23 10:53 AM

## 2023-04-29 NOTE — ED Triage Notes (Signed)
Pt BIBA from home for right shin redness and pain. Recently seen and dx with cellulitis in the left leg and is on abx for it. States there was a 'blister' 2 weeks ago and 'then it started moving'. Wasn't able to get pain meds from caregiver last night.   106 palp HR 72

## 2023-04-29 NOTE — ED Notes (Signed)
[facility] called for transport.

## 2023-04-29 NOTE — ED Provider Notes (Signed)
EMERGENCY DEPARTMENT AT [facility] Provider Note   CSN: [id number] Arrival date & time: 04/29/23  7564     History  Chief Complaint  Patient presents with   Leg Pain    Thomas Ramos is a 53 y.o. male.  HPI 53 year old male presents with right leg pain.  Patient states the pain has been bothering him for a couple weeks and he has been admitted to the hospital twice for a left leg infection.  The pain was worse last night.  He denies any fevers or recent trauma.  The pain is primarily to his anterior lower leg.  He states there is a bruise there, and this is where the pain is primarily.  Patient has a pertinent history of cerebral palsy, diastolic heart failure, diabetes, hypertension.  Home Medications Prior to Admission medications   Medication Sig Start Date End Date Taking? Authorizing Provider  acetaminophen (TYLENOL) 325 MG tablet Take 2 tablets (650 mg total) by mouth every 6 (six) hours as needed for mild pain (pain score 1-3), moderate pain (pain score 4-6) or fever (or Fever >/= 101). 04/23/23   Hongalgi, Maximino Greenland, MD  Albuterol Sulfate (PROAIR RESPICLICK) 108 (90 Base) MCG/ACT AEPB Inhale 2 puffs into the lungs every 6 (six) hours as needed (Shortness of breath). 05/03/20   [provider]  amLODipine (NORVASC) 10 MG tablet Take 5 mg by mouth daily. 12/07/22   [provider]  aspirin 81 MG chewable tablet Chew 81 mg by mouth daily. Patient not taking: Reported on 04/21/2023    [provider]  baclofen (LIORESAL) 10 MG tablet Take 2.5 mg by mouth at bedtime. 06/01/18   [provider]  Dulaglutide 0.75 MG/0.5ML SOPN Inject 0.75 mg into the skin once a week. Wednesday 02/26/20   [provider]  furosemide (LASIX) 20 MG tablet Take 20 mg by mouth daily. 10/23/20   [provider]  latanoprost (XALATAN) 0.005 % ophthalmic solution Place 1 drop into both eyes at bedtime. 08/25/21   [provider]   metoprolol succinate (TOPROL-XL) 25 MG 24 hr tablet Take 25 mg by mouth daily. 02/13/21   [provider]  montelukast (SINGULAIR) 10 MG tablet Take 1 tablet by mouth daily. 07/28/21   [provider]  nitroGLYCERIN (NITROSTAT) 0.4 MG SL tablet Place 0.4 mg under the tongue every 5 (five) minutes as needed for chest pain. Patient not taking: Reported on 04/21/2023    [provider]  omeprazole (PRILOSEC) 20 MG capsule Take 1 capsule by mouth every morning. 12/12/19   [provider]  rosuvastatin (CRESTOR) 5 MG tablet Take 5 mg by mouth at bedtime.    [provider]  timolol (TIMOPTIC) 0.5 % ophthalmic solution Place 1 drop into both eyes 2 (two) times daily. 04/10/20   [provider]  traMADol (ULTRAM) 50 MG tablet Take 1 tablet (50 mg total) by mouth every 12 (twelve) hours as needed for severe pain (pain score 7-10). 04/26/23   Hongalgi, Maximino Greenland, MD  valsartan (DIOVAN) 80 MG tablet Take 80 mg by mouth daily.    [provider]      Allergies    Penicillins, Sulfamethoxazole-trimethoprim, and Metronidazole    Review of Systems   Review of Systems  Constitutional:  Negative for fever.  Musculoskeletal:  Positive for arthralgias.  Skin:  Positive for color change.    Physical Exam Updated Vital Signs BP 117/60 (BP Location: Right Arm)   Pulse 88  Temp 98.7 F (37.1 C) (Oral)   Resp (!) 22   SpO2 97%  Physical Exam Vitals and nursing note reviewed.  Constitutional:      Appearance: He is well-developed.     Comments: Patient has contractures.  HENT:     Head: Normocephalic and atraumatic.  Cardiovascular:     Rate and Rhythm: Normal rate and regular rhythm.     Pulses:          Dorsalis pedis pulses are detected w/ Doppler on the right side.  Pulmonary:     Effort: Pulmonary effort is normal.  Musculoskeletal:     Comments: See picture of right lower leg. The primary area of pain is the darkened skin to his lower  anterior leg. No focal swelling. No increased warmth, crepitus or pain out of proportion.  Skin:    General: Skin is warm and dry.  Neurological:     Mental Status: He is alert.     ED Results / Procedures / Treatments   Labs (all labs ordered are listed, but only abnormal results are displayed) Labs Reviewed  COMPREHENSIVE METABOLIC PANEL - Abnormal; Notable for the following components:      Result Value   Glucose, Bld 115 (*)    AST 14 (*)    All other components within normal limits  CBC WITH DIFFERENTIAL/PLATELET - Abnormal; Notable for the following components:   Hemoglobin 12.8 (*)    All other components within normal limits    EKG None  Radiology DG Tibia/Fibula Right Result Date: 04/29/2023 CLINICAL DATA:  Patient with contracture.  Right leg pain EXAM: RIGHT TIBIA AND FIBULA - 2 VIEW COMPARISON:  None Available. FINDINGS: Gracile bones. There is no evidence of fracture or other focal bone lesions. Diffuse muscle atrophy. Partially imaged collapse of the hindfoot. Mild diffuse subcutaneous soft tissue reticulations of the lower leg. IMPRESSION: Mild diffuse subcutaneous soft tissue reticulations of the lower leg, which may reflect soft tissue edema. Recommend correlation with physical examination. Electronically Signed   By: Agustin Cree M.D.   On: 04/29/2023 13:01   VAS [place] LOWER EXTREMITY VENOUS (DVT) (7a-7p) Result Date: 04/29/2023  Lower Venous DVT Study Patient Name:  Thomas Ramos  Date of Exam:   04/29/2023 Medical Rec #: [id number]      Accession #:    [id number] Date of Birth: Apr 03, 1970       Patient Gender: M Patient Age:   67 years Exam Location:  [facility] Procedure:      VAS [place] LOWER EXTREMITY VENOUS (DVT) Referring Phys: Lorin Picket Morgen Ritacco --------------------------------------------------------------------------------  Indications: Pain.  Limitations: Poor ultrasound/tissue interface and Patient positioning. Comparison Study: No significant changes seen since  previous exam 2/4/253 Performing Technologist: Shona Simpson  Examination Guidelines: A complete evaluation includes B-mode imaging, spectral Doppler, color Doppler, and power Doppler as needed of all accessible portions of each vessel. Bilateral testing is considered an integral part of a complete examination. Limited examinations for reoccurring indications may be performed as noted. The reflux portion of the exam is performed with the patient in reverse Trendelenburg.  +---------+---------------+---------+-----------+----------+-------------------+ RIGHT    CompressibilityPhasicitySpontaneityPropertiesThrombus Aging      +---------+---------------+---------+-----------+----------+-------------------+ CFV      Full           Yes      Yes                                      +---------+---------------+---------+-----------+----------+-------------------+  SFJ      Full                                                             +---------+---------------+---------+-----------+----------+-------------------+ FV Prox  Full                                                             +---------+---------------+---------+-----------+----------+-------------------+ FV Mid   Full                                                             +---------+---------------+---------+-----------+----------+-------------------+ FV Distal                        Yes                  Patent by color     +---------+---------------+---------+-----------+----------+-------------------+ PFV      Full                                                             +---------+---------------+---------+-----------+----------+-------------------+ POP      Full           Yes      Yes                                      +---------+---------------+---------+-----------+----------+-------------------+ PTV      Full                                         Not well visualized  +---------+---------------+---------+-----------+----------+-------------------+ PERO     Full                                         Not well visualized +---------+---------------+---------+-----------+----------+-------------------+   +----+---------------+---------+-----------+----------+-------------------+ LEFTCompressibilityPhasicitySpontaneityPropertiesThrombus Aging      +----+---------------+---------+-----------+----------+-------------------+ CFV                                              Unable to visualize +----+---------------+---------+-----------+----------+-------------------+     Summary: RIGHT: - There is no evidence of deep vein thrombosis in the lower extremity.  - No cystic structure found in the popliteal fossa.   *See table(s) above for measurements and observations.    Preliminary     Procedures Procedures    Medications Ordered in ED Medications  ibuprofen (ADVIL) tablet 600 mg (600 mg Oral Given 04/29/23 1345)    ED Course/ Medical Decision Making/ A&P                                 Medical Decision Making Amount and/or Complexity of Data Reviewed External Data Reviewed: notes.    Details: Discharge summary from a couple days ago Labs: ordered.    Details: Normal WBC Radiology: ordered and independent interpretation performed.    Details: No no fracture to his lower extremity  Risk OTC drugs.   I reviewed the hospitalist notes and discharge summary.  The lesion on the leg looks the same as the picture from 2/10.  This does not seem consistent with cellulitis.  WBC is normal.  His vital signs are unremarkable.  He was given some ibuprofen and a DVT study was obtained in addition to an x-ray which were both unremarkable.  I think the prior plan by the hospitalist of outpatient dermatology make sense but I do not think there is any emergent medical condition or need for admission.  Will discharge with return  precautions.        Final Clinical Impression(s) / ED Diagnoses Final diagnoses:  Right leg pain    Rx / DC Orders ED Discharge Orders     None         Pricilla Loveless, MD 04/29/23 1450

## 2023-04-29 NOTE — Discharge Instructions (Signed)
Follow-up with your primary care physician.  If you develop new or worsening or uncontrolled pain, fever, color change to the skin, or any other new/concerning symptoms then return to the ER or call 911.

## 2023-04-30 LAB — CULTURE, BLOOD (ROUTINE X 2)
Culture: NO GROWTH
Culture: NO GROWTH

## 2023-05-12 ENCOUNTER — Telehealth: Payer: Self-pay | Admitting: Internal Medicine

## 2023-05-12 NOTE — Telephone Encounter (Signed)
 Patient identification verified by 2 forms. Marilynn Rail, RN    Called and spoke to patient  Patient states:   -PCP would like him to follow up with cardiology regarding BP   -does not check BP at home   -was told BP is low  Patient denies:   -increased fatigue   -lightheaded/dizziness  RN offered 2/27 and 3/12 OV, patient declined  Per patient request OV scheduled for 3/17  Reviewed ED warning signs/precautions  Patient verbalized understanding, no questions at this time   2/25 PCP OV:

## 2023-05-12 NOTE — Telephone Encounter (Signed)
 Pt c/o BP issue: STAT if pt c/o blurred vision, one-sided weakness or slurred speech  1. What are your last 5 BP readings? Doesn't know no  2. Are you having any other symptoms (ex. Dizziness, headache, blurred vision, passed out)? no  3. What is your BP issue? He said it is running low

## 2023-05-31 ENCOUNTER — Ambulatory Visit: Payer: Medicare Other | Attending: Physician Assistant | Admitting: Physician Assistant

## 2023-05-31 VITALS — BP 129/75 | HR 117 | Ht 65.0 in | Wt 200.0 lb

## 2023-05-31 DIAGNOSIS — I10 Essential (primary) hypertension: Secondary | ICD-10-CM | POA: Insufficient documentation

## 2023-05-31 DIAGNOSIS — I4719 Other supraventricular tachycardia: Secondary | ICD-10-CM | POA: Diagnosis present

## 2023-05-31 MED ORDER — METOPROLOL SUCCINATE ER 25 MG PO TB24: 25.0000 mg | ORAL_TABLET | Freq: Every day | ORAL | 3 refills | Status: DC

## 2023-05-31 NOTE — Progress Notes (Unsigned)
 Cardiology Office Note:  .   Date:  06/01/2023  ID:  Thomas Ramos, DOB Jul 02, 1970, MRN [id number] PCP: Dani Gobble, PA-C  [facility] Providers Cardiologist:  Parke Poisson, MD     History of Present Illness: Marland Kitchen   Thomas Ramos is a 53 y.o. male with PMH of chronic diastolic heart failure, hypertension, hyperlipidemia, DM2, GERD and cerebral palsy.  His metoprolol was discontinued due to fatigue.  Echocardiogram obtained on 08/09/2020 showed EF 70 to 75%, no regional wall motion abnormality, mild LVH, grade 1 DD, no significant mitral or aortic valve issue.  It was suspected his heart murmur is flow related.  We then, patient had multiple hospitalizations.  He was hospitalized in early February 2025 due to left leg cellulitis.  Symptom was worsening for 2 weeks prior to admission.  Venous Doppler showed no evidence of DVT.  He was treated with IV antibiotic.  Infectious disease service was consulted who felt the distal left lower leg rash was not consistent with typical cellulitis and the question underlying vasculitis, however since he had a improvement with ceftriaxone, they recommended a 7-day course of antibiotic was cefadroxil with compression stocking.  He was readmitted 2 days after discharge with plaint of severe left leg pain.  This was found to be related to the compression stocking that was placed on the day of discharge.  Cellulitis itself has improved or even resolved.  He had no fever or leukocytosis.  CRP and ESR normal.  MRSA PCR negative.  Blood culture x 2 negative to date.  He was last seen by his PCP on 05/11/2023, blood pressure was 100/64 at the time.  He was set up to see podiatry service in mid March.  Based on phone message from 05/12/2023, patient was told by his PCP to follow-up with cardiology service regarding blood pressure.  He was told his blood pressures low.  Patient presents today for follow-up.  He denies any chest pain, shortness of breath or  dizziness.  Since discharge, he says his PCP has already discontinued all blood pressure medication including amlodipine, metoprolol and valsartan.  At this time, his blood pressure has normalized at 129/75.  However he is he is sinus tachycardia with heart rate of 117 bpm.  I recommended restart metoprolol succinate at 25 mg daily to help control the heart rate.  We will reassess the patient in 3 weeks.  He has no significant lower extremity edema, orthopnea or PND.  ROS:   He denies chest pain, palpitations, dyspnea, pnd, orthopnea, n, v, dizziness, syncope, edema, weight gain, or early satiety. All other systems reviewed and are otherwise negative except as noted above.    Studies Reviewed: Marland Kitchen   EKG Interpretation Date/Time:  Monday May 31 2023 15:01:00 EDT Ventricular Rate:  117 PR Interval:  144 QRS Duration:  90 QT Interval:  308 QTC Calculation: 429 R Axis:   125  Text Interpretation: Sinus tachycardia Right axis deviation Right ventricular hypertrophy Nonspecific ST abnormality Confirmed by Azalee Course [phone]) on 05/31/2023 3:14:39 PM    Cardiac Studies & Procedures   ______________________________________________________________________________________________     ECHOCARDIOGRAM  ECHOCARDIOGRAM COMPLETE 08/09/2020  Narrative ECHOCARDIOGRAM REPORT    Patient Name:   Thomas Ramos Date of Exam: 08/09/2020 Medical Rec #:  [id number]     Height:       65.0 in Accession #:    [id number]    Weight:       145.0 lb Date of  Birth:  [birth year]      BSA:          1.725 m Patient Age:    50 years      BP:           124/84 mmHg Patient Gender: M             HR:           112 bpm. Exam Location:  Outpatient  Procedure: 2D Echo, Cardiac Doppler and Color Doppler  Indications:    R00.0 Tachycardia; ; R94.31 Abnormal EKG  History:        Patient has prior history of Echocardiogram examinations, most recent 03/15/2017. CHF, Signs/Symptoms:Murmur; Risk Factors:Hypertension and Sleep  Apnea. Cerebral Palsy. GERD.  Sonographer:    Tiffany Dance Referring Phys: [id number] Parke Poisson   Sonographer Comments: Technically difficult study due to poor echo windows, no subcostal window and suboptimal apical window. No IV access. Multiple IV attempts by RN. IMPRESSIONS   1. Left ventricular ejection fraction, by estimation, is 70 to 75%. The left ventricle has hyperdynamic function. The left ventricle has no regional wall motion abnormalities. There is mild left ventricular hypertrophy. Left ventricular diastolic parameters are consistent with Grade I diastolic dysfunction (impaired relaxation). 2. Right ventricular systolic function is normal. The right ventricular size is normal. 3. The mitral valve is normal in structure. No evidence of mitral valve regurgitation. No evidence of mitral stenosis. 4. The aortic valve is normal in structure. Aortic valve regurgitation is not visualized. No aortic stenosis is present. 5. The inferior vena cava is normal in size with greater than 50% respiratory variability, suggesting right atrial pressure of 3 mmHg.  FINDINGS Left Ventricle: Left ventricular ejection fraction, by estimation, is 70 to 75%. The left ventricle has hyperdynamic function. The left ventricle has no regional wall motion abnormalities. The left ventricular internal cavity size was normal in size. There is mild left ventricular hypertrophy. Left ventricular diastolic parameters are consistent with Grade I diastolic dysfunction (impaired relaxation).  Right Ventricle: The right ventricular size is normal. No increase in right ventricular wall thickness. Right ventricular systolic function is normal.  Left Atrium: Left atrial size was normal in size.  Right Atrium: Right atrial size was normal in size.  Pericardium: There is no evidence of pericardial effusion.  Mitral Valve: The mitral valve is normal in structure. No evidence of mitral valve regurgitation. No  evidence of mitral valve stenosis.  Tricuspid Valve: The tricuspid valve is normal in structure. Tricuspid valve regurgitation is not demonstrated. No evidence of tricuspid stenosis.  Aortic Valve: The aortic valve is normal in structure. Aortic valve regurgitation is not visualized. No aortic stenosis is present.  Pulmonic Valve: The pulmonic valve was normal in structure. Pulmonic valve regurgitation is not visualized. No evidence of pulmonic stenosis.  Aorta: The aortic root is normal in size and structure.  Venous: The inferior vena cava is normal in size with greater than 50% respiratory variability, suggesting right atrial pressure of 3 mmHg.  IAS/Shunts: No atrial level shunt detected by color flow Doppler.   LEFT VENTRICLE PLAX 2D LVIDd:         3.10 cm LVIDs:         1.70 cm LV PW:         1.70 cm LV IVS:        1.20 cm   LEFT ATRIUM           Index LA diam:  3.20 cm 1.85 cm/m LA Vol (A4C): 17.1 ml 9.91 ml/m  AORTA Ao Asc diam: 3.60 cm  MITRAL VALVE MV Area (PHT): 5.13 cm MV Decel Time: 148 msec MV E velocity: 50.80 cm/s MV A velocity: 51.40 cm/s MV E/A ratio:  0.99  Donato Schultz MD Electronically signed by Donato Schultz MD Signature Date/Time: 08/09/2020/5:26:07 PM    Final          ______________________________________________________________________________________________      Risk Assessment/Calculations:             Physical Exam:   VS:  BP 129/75 (BP Location: Left Arm, Patient Position: Sitting, Cuff Size: Normal)   Pulse (!) 117   Ht 5\' 5"  (1.651 m)   Wt 200 lb (90.7 kg)   SpO2 98%   BMI 33.28 kg/m    Wt Readings from Last 3 Encounters:  06/01/23 200 lb (90.7 kg)  05/31/23 200 lb (90.7 kg)  04/20/23 200 lb (90.7 kg)    GEN: well developed in no acute distress. In electric wheelchair NECK: No JVD; No carotid bruits CARDIAC: Tachycardic, no murmurs, rubs, gallops RESPIRATORY:  Clear to auscultation without rales, wheezing or  rhonchi  ABDOMEN: Soft, non-tender, non-distended EXTREMITIES:  No edema; No deformity   ASSESSMENT AND PLAN: .    Atrial tachycardia: Heart rate 117 bpm.  Will resume metoprolol succinate 25 mg daily  History of hypertension: Due to recent low blood pressure, his amlodipine, metoprolol and valsartan were all discontinued.  However his heart rate is elevated, I will resume metoprolol succinate 25 mg daily.       Dispo: Follow-up in 3 weeks  Signed, Azalee Course, PA

## 2023-05-31 NOTE — Patient Instructions (Signed)
 Medication Instructions:  DISCONTINUE VALSARTAN  DISCONTINUE AMLODIPINE  RESTART METOPROLOL SUCCINATE 25 MG DAILY *If you need a refill on your cardiac medications before your next appointment, please call your pharmacy*   Lab Work: NO LABS If you have labs (blood work) drawn today and your tests are completely normal, you will receive your results only by: MyChart Message (if you have MyChart) OR A paper copy in the mail If you have any lab test that is abnormal or we need to change your treatment, we will call you to review the results.   Testing/Procedures: NO TESTING   Follow-Up: At [facility], you and your health needs are our priority.  As part of our continuing mission to provide you with exceptional heart care, we have created designated Provider Care Teams.  These Care Teams include your primary Cardiologist (physician) and Advanced Practice Providers (APPs -  Physician Assistants and Nurse Practitioners) who all work together to provide you with the care you need, when you need it.  We recommend signing up for the patient portal called "MyChart".  Sign up information is provided on this After Visit Summary.  MyChart is used to connect with patients for Virtual Visits (Telemedicine).  Patients are able to view lab/test results, encounter notes, upcoming appointments, etc.  Non-urgent messages can be sent to your provider as well.   To learn more about what you can do with MyChart, go to [email or web address].    Your next appointment:   3 week(s)  Provider:   Azalee Course, PA  Other Instructions

## 2023-06-01 ENCOUNTER — Encounter: Payer: Self-pay | Admitting: Podiatry

## 2023-06-01 ENCOUNTER — Ambulatory Visit (INDEPENDENT_AMBULATORY_CARE_PROVIDER_SITE_OTHER): Payer: Medicare Other | Admitting: Podiatry

## 2023-06-01 VITALS — Ht 65.0 in | Wt 200.0 lb

## 2023-06-01 DIAGNOSIS — E1142 Type 2 diabetes mellitus with diabetic polyneuropathy: Secondary | ICD-10-CM | POA: Diagnosis not present

## 2023-06-01 DIAGNOSIS — Z872 Personal history of diseases of the skin and subcutaneous tissue: Secondary | ICD-10-CM

## 2023-06-01 DIAGNOSIS — E119 Type 2 diabetes mellitus without complications: Secondary | ICD-10-CM

## 2023-06-01 DIAGNOSIS — M2141 Flat foot [pes planus] (acquired), right foot: Secondary | ICD-10-CM

## 2023-06-01 DIAGNOSIS — M79674 Pain in right toe(s): Secondary | ICD-10-CM | POA: Diagnosis not present

## 2023-06-01 DIAGNOSIS — B351 Tinea unguium: Secondary | ICD-10-CM

## 2023-06-01 DIAGNOSIS — M79675 Pain in left toe(s): Secondary | ICD-10-CM

## 2023-06-01 DIAGNOSIS — M2142 Flat foot [pes planus] (acquired), left foot: Secondary | ICD-10-CM

## 2023-06-07 NOTE — Progress Notes (Signed)
 ANNUAL DIABETIC FOOT EXAM  Subjective: Thomas Ramos presents today for annual diabetic foot exam. Patient states his motorized chair needs to be repaired and he has scheduled it for repair. He has h/o left lower extremity cellulitis. Chief Complaint  Patient presents with   Nail Problem    Pt is here for [facility] PCP is Dr Karleen Hampshire and LOV was in February.   Patient confirms h/o diabetes.  Patient has been diagnosed with neuropathy.  Dani Gobble, PA-C is patient's PCP.  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HCC)    S/P Botox injection    approx every 4 months   Seasonal allergies    Patient Active Problem List   Diagnosis Date Noted   Left leg cellulitis 04/20/2023   Heart murmur 02/26/2020   Hiatal hernia    Type 2 diabetes mellitus with complication, without long-term current use of insulin (HCC) 08/03/2017   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HCC)    Pressure injury of skin 08/16/2016   Onychomycosis 02/20/2016   Essential hypertension    OSA (obstructive sleep apnea) 12/11/2015   Chronic diastolic (congestive) heart failure (HCC)    Chronic venous insufficiency    Depression with anxiety 09/20/2015   Hypersomnia 07/16/2015   GERD (gastroesophageal reflux disease) 07/16/2015   Snoring 07/16/2015   Hyperlipidemia 02/22/2012   Dysphagia 09/16/2010   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HCC) 09/16/2010   Past Surgical History:  Procedure Laterality Date   BIOPSY  06/24/2021   Procedure: BIOPSY;  Surgeon: Sherrilyn Rist, MD;  Location: [facility];  Service: Gastroenterology;;   BOTOX INJECTION N/A 12/21/2012   Procedure: BOTOX INJECTION;  Surgeon: Louis Meckel, MD;  Location: [facility] ENDOSCOPY;  Service: Endoscopy;  Laterality: N/A;   BOTOX INJECTION N/A 06/28/2014   Procedure: BOTOX INJECTION;  Surgeon: Louis Meckel, MD;  Location: [facility]COPY;  Service: Endoscopy;  Laterality: N/A;   COLONOSCOPY WITH PROPOFOL N/A 06/24/2021   Procedure: COLONOSCOPY WITH PROPOFOL;  Surgeon: Sherrilyn Rist, MD;  Location: [facility] ENDOSCOPY;  Service: Gastroenterology;  Laterality: N/A;   ESOPHAGOGASTRODUODENOSCOPY N/A 12/21/2012   Procedure: ESOPHAGOGASTRODUODENOSCOPY (EGD);  Surgeon: Louis Meckel, MD;  Location: [facility];  Service: Endoscopy;  Laterality: N/A;   ESOPHAGOGASTRODUODENOSCOPY N/A 06/28/2014   Procedure: ESOPHAGOGASTRODUODENOSCOPY (EGD);  Surgeon: Louis Meckel, MD;  Location: [facility] ENDOSCOPY;  Service: Endoscopy;  Laterality: N/A;   ESOPHAGOGASTRODUODENOSCOPY N/A 06/24/2021   Procedure: ESOPHAGOGASTRODUODENOSCOPY (EGD);  Surgeon: Sherrilyn Rist, MD;  Location: [facility];  Service: Gastroenterology;  Laterality: N/A;   ESOPHAGOGASTRODUODENOSCOPY (EGD) WITH PROPOFOL N/A 09/03/2017   Procedure: ESOPHAGOGASTRODUODENOSCOPY (EGD) WITH PROPOFOL;  Surgeon: Sherrilyn Rist, MD;  Location: [facility] ENDOSCOPY;  Service: Gastroenterology;  Laterality: N/A;   ESOPHAGUS SURGERY     stretched esophagus   legs     MOUTH SURGERY     POLYPECTOMY  06/24/2021   Procedure: POLYPECTOMY;  Surgeon: Sherrilyn Rist, MD;  Location: [facility];  Service: Gastroenterology;;   TENDON RELEASE     Current Outpatient Medications on File Prior to Visit  Medication Sig Dispense Refill   acetaminophen (TYLENOL) 325 MG tablet Take 2 tablets (650 mg total) by mouth every 6 (six) hours as needed for mild pain (pain score 1-3), moderate pain (pain score 4-6) or fever (or Fever >/= 101).     Albuterol Sulfate (PROAIR  RESPICLICK) 108 (90 Base) MCG/ACT AEPB Inhale 2 puffs into the lungs every 6 (six) hours as needed (Shortness of breath).     aspirin 81 MG chewable tablet Chew 81 mg by mouth daily.     baclofen (LIORESAL) 10 MG tablet Take 2.5 mg by mouth at bedtime.     Dulaglutide 0.75 MG/0.5ML SOPN Inject 0.75 mg into the skin once a week. Wednesday     furosemide (LASIX) 20 MG tablet Take 20 mg by mouth daily.     latanoprost (XALATAN) 0.005 % ophthalmic solution Place 1 drop into both eyes at bedtime.     metoprolol succinate (TOPROL-XL) 25 MG 24 hr tablet Take 1 tablet (25 mg total) by mouth daily. 90 tablet 3   montelukast (SINGULAIR) 10 MG tablet Take 1 tablet by mouth daily.     nitroGLYCERIN (NITROSTAT) 0.4 MG SL tablet Place 0.4 mg under the tongue every 5 (five) minutes as needed for chest pain.     omeprazole (PRILOSEC) 20 MG capsule Take 1 capsule by mouth every morning.     rosuvastatin (CRESTOR) 5 MG tablet Take 5 mg by mouth at bedtime.     timolol (TIMOPTIC) 0.5 % ophthalmic solution Place 1 drop into both eyes 2 (two) times daily.     traMADol (ULTRAM) 50 MG tablet Take 1 tablet (50 mg total) by mouth every 12 (twelve) hours as needed for severe pain (pain score 7-10). 6 tablet 0   No current facility-administered medications on file prior to visit.    Allergies  Allergen Reactions   Penicillins Hives, Nausea And Vomiting and Other (See Comments)    Patient tolerated cefazolin in 2017   Sulfamethoxazole-Trimethoprim Nausea And Vomiting   Metronidazole Nausea And Vomiting   Social History   Occupational History   Occupation: Disabled  Tobacco Use   Smoking status: Never   Smokeless tobacco: Former   Tobacco comments:    smoked one time  Vaping Use   Vaping status: Never Used  Substance and Sexual Activity   Alcohol use: Yes    Comment: 2 beers every other Friday and Saturday   Drug use: No   Sexual activity: Not on file   Family History  Problem Relation Age of Onset    Hypertension Father    Lung cancer Father    Diabetes Mother    Immunization History  Administered Date(s) Administered   Influenza Inj Mdck Quad Pf 01/06/2021, 12/09/2021   Influenza, Seasonal, Injecte, Preservative Fre 12/13/2014, 12/09/2015   Influenza,inj,Quad PF,6+ Mos 12/16/2017, 12/29/2018   Influenza-Unspecified 01/04/2014, 12/13/2014, 12/15/2015, 12/16/2017, 12/29/2018, 01/06/2021   PFIZER(Purple Top)SARS-COV-2 Vaccination 05/05/2019, 05/26/2019, 03/23/2020   Pfizer Covid-19 Vaccine Bivalent Booster 54yrs & up 01/29/2021   Tdap 07/09/2015     Review of Systems: Negative except as noted in the HPI.   Objective: There were no vitals filed for this visit.  Thomas Ramos is a pleasant 53 y.o. male in NAD. AAO X 3.  Diabetic foot exam was performed with the following findings:   Vascular Examination: Capillary refill time immediate b/l. Vascular status intact b/l with  palpable pedal pulses. Pedal hair absent.  No pain with calf compression b/l. Skin temperature gradient WNL b/l. No cyanosis or clubbing b/l. No ischemia or gangrene noted b/l. Dependent edema noted b/l LE.  Neurological Examination: Sensation grossly intact b/l with 10 gram monofilament. Vibratory sensation intact b/l. Pt has subjective symptoms of neuropathy.  Dermatological Examination: Pedal skin with normal turgor, texture and tone b/l.  No open wounds. No interdigital macerations.   Toenails 1-5 b/l thick, discolored, elongated with subungual debris and pain on dorsal palpation.   No corns, calluses nor porokeratotic lesions noted.  Musculoskeletal Examination: Flaccid lower extremity noted b/l lower extremities. Overlapping digit left hallux. Pes planus b/l. Utilizes motorized chair for mobility assistance.  Radiographs: None     Lab Results  Component Value Date   HGBA1C 5.9 (H) 04/21/2023  ADA Risk Categorization: High Risk  Patient has one or more of the following: Loss of protective  sensation Absent pedal pulses Severe Foot deformity History of foot ulcer  Assessment: 1. Pain due to onychomycosis of toenails of both feet   2. History of ulcer of lower extremity   3. Pes planus of both feet   4. Diabetic peripheral neuropathy associated with type 2 diabetes mellitus (HCC)   5. Encounter for diabetic foot exam (HCC)     Plan: -Consent given for treatment as described below: -Examined patient. -Diabetic foot examination performed today. -Continue supportive shoe gear daily. -Toenails 1-5 bilaterally were debrided in length and girth with sterile nail nippers and dremel. Pinpoint bleeding of L 5th toe addressed with Lumicain Hemostatic Solution, cleansed with alcohol. Triple antibiotic ointment applied. No further treatment required by patient/caregiver. -Patient/POA to call should there be question/concern in the interim. Return in about 4 months (around 10/01/2023).  Freddie Breech, DPM      [place] LOCATION: [address], [place] [address]                   Office [phone]   [facility] LOCATION: [address] [place], [place] [address] Office [phone]

## 2023-06-29 ENCOUNTER — Ambulatory Visit: Admitting: Physician Assistant

## 2023-07-01 ENCOUNTER — Ambulatory Visit: Attending: Physician Assistant | Admitting: Physician Assistant

## 2023-07-01 VITALS — BP 108/72 | HR 58

## 2023-07-01 DIAGNOSIS — I5032 Chronic diastolic (congestive) heart failure: Secondary | ICD-10-CM | POA: Diagnosis present

## 2023-07-01 DIAGNOSIS — I4719 Other supraventricular tachycardia: Secondary | ICD-10-CM | POA: Insufficient documentation

## 2023-07-01 MED ORDER — METOPROLOL SUCCINATE ER 25 MG PO TB24: 12.5000 mg | ORAL_TABLET | Freq: Every day | ORAL | 3 refills | Status: AC

## 2023-07-01 NOTE — Progress Notes (Signed)
 Cardiology Office Note:  .   Date:  07/03/2023  ID:  Thomas Ramos, DOB [birth year], MRN [id number] PCP: Jearlean Mince, PA-C  [facility] Providers Cardiologist:  Euell Herrlich, MD     History of Present Illness: Thomas Ramos   Thomas Ramos is a 53 y.o. male with PMH of chronic diastolic heart failure, hypertension, hyperlipidemia, DM2, GERD and cerebral palsy.  Echocardiogram obtained on 08/09/2020 showed EF 70 to 75%, no regional wall motion abnormality, mild LVH, grade 1 DD, no significant mitral or aortic valve issue.  It was suspected his heart murmur is flow related.  Since then, patient had multiple hospitalizations.  He was hospitalized in early February 2025 due to left leg cellulitis.  Symptom was worsening for 2 weeks prior to admission.  Venous Doppler showed no evidence of DVT.  He was treated with IV antibiotic.  Infectious disease service was consulted who felt the distal left lower leg rash was not consistent with typical cellulitis and questioned underlying vasculitis, however since he had a improvement with ceftriaxone , they recommended a 7-day course of antibiotic with cefadroxil  with compression stocking.  He was readmitted 2 days after discharge with complaint of severe left leg pain.  This was found to be related to the compression stocking that was placed on the day of discharge.  Cellulitis itself has improved or even resolved.  He had no fever or leukocytosis.  CRP and ESR normal.  MRSA PCR negative.  Blood culture x 2 negative to date.  He was last seen by his PCP on 05/11/2023, blood pressure was 100/64 at the time.  He was set up to see podiatry service in mid March.  Based on phone message from 05/12/2023, patient was told by his PCP to follow-up with cardiology service regarding blood pressure.  He was told his blood pressures low. His PCP discontinued all BP meds including amlodipine , metoprolol  and valsartan. His BP was normal on the last visit, however he was tachycardic  with HR 117 last time, I restarted his metoprolol  succinate at 25mg  daily to help control HR.   He presents today for 3 week follow up.  Patient presents today for follow-up.  He has been feeling well after starting on the metoprolol  succinate, his heart rate today is 58 bpm.  I decided to cut the metoprolol  succinate to half which is 12.5 mg daily.  He has no significant lower extremity edema.  He denies any chest pain or shortness of breath.  Overall he has been doing well and can follow-up with Dr. Acharya in 4 to 6 months.  ROS:   He denies chest pain, palpitations, dyspnea, pnd, orthopnea, n, v, dizziness, syncope, edema, weight gain, or early satiety. All other systems reviewed and are otherwise negative except as noted above.    Studies Reviewed: .        Cardiac Studies & Procedures   ______________________________________________________________________________________________     ECHOCARDIOGRAM  ECHOCARDIOGRAM COMPLETE 08/09/2020  Narrative ECHOCARDIOGRAM REPORT    Patient Name:   Thomas Ramos Date of Exam: 08/09/2020 Medical Rec #:  [id number]     Height:       65.0 in Accession #:    [id number]    Weight:       145.0 lb Date of Birth:  [birth year]      BSA:          1.725 m Patient Age:    50 years      BP:  124/84 mmHg Patient Gender: M             HR:           112 bpm. Exam Location:  Outpatient  Procedure: 2D Echo, Cardiac Doppler and Color Doppler  Indications:    R00.0 Tachycardia; ; R94.31 Abnormal EKG  History:        Patient has prior history of Echocardiogram examinations, most recent 03/15/2017. CHF, Signs/Symptoms:Murmur; Risk Factors:Hypertension and Sleep Apnea. Cerebral Palsy. GERD.  Sonographer:    Tiffany Dance Referring Phys: [id number] Euell Herrlich   Sonographer Comments: Technically difficult study due to poor echo windows, no subcostal window and suboptimal apical window. No IV access. Multiple IV attempts by  RN. IMPRESSIONS   1. Left ventricular ejection fraction, by estimation, is 70 to 75%. The left ventricle has hyperdynamic function. The left ventricle has no regional wall motion abnormalities. There is mild left ventricular hypertrophy. Left ventricular diastolic parameters are consistent with Grade I diastolic dysfunction (impaired relaxation). 2. Right ventricular systolic function is normal. The right ventricular size is normal. 3. The mitral valve is normal in structure. No evidence of mitral valve regurgitation. No evidence of mitral stenosis. 4. The aortic valve is normal in structure. Aortic valve regurgitation is not visualized. No aortic stenosis is present. 5. The inferior vena cava is normal in size with greater than 50% respiratory variability, suggesting right atrial pressure of 3 mmHg.  FINDINGS Left Ventricle: Left ventricular ejection fraction, by estimation, is 70 to 75%. The left ventricle has hyperdynamic function. The left ventricle has no regional wall motion abnormalities. The left ventricular internal cavity size was normal in size. There is mild left ventricular hypertrophy. Left ventricular diastolic parameters are consistent with Grade I diastolic dysfunction (impaired relaxation).  Right Ventricle: The right ventricular size is normal. No increase in right ventricular wall thickness. Right ventricular systolic function is normal.  Left Atrium: Left atrial size was normal in size.  Right Atrium: Right atrial size was normal in size.  Pericardium: There is no evidence of pericardial effusion.  Mitral Valve: The mitral valve is normal in structure. No evidence of mitral valve regurgitation. No evidence of mitral valve stenosis.  Tricuspid Valve: The tricuspid valve is normal in structure. Tricuspid valve regurgitation is not demonstrated. No evidence of tricuspid stenosis.  Aortic Valve: The aortic valve is normal in structure. Aortic valve regurgitation is not  visualized. No aortic stenosis is present.  Pulmonic Valve: The pulmonic valve was normal in structure. Pulmonic valve regurgitation is not visualized. No evidence of pulmonic stenosis.  Aorta: The aortic root is normal in size and structure.  Venous: The inferior vena cava is normal in size with greater than 50% respiratory variability, suggesting right atrial pressure of 3 mmHg.  IAS/Shunts: No atrial level shunt detected by color flow Doppler.   LEFT VENTRICLE PLAX 2D LVIDd:         3.10 cm LVIDs:         1.70 cm LV PW:         1.70 cm LV IVS:        1.20 cm   LEFT ATRIUM           Index LA diam:      3.20 cm 1.85 cm/m LA Vol (A4C): 17.1 ml 9.91 ml/m  AORTA Ao Asc diam: 3.60 cm  MITRAL VALVE MV Area (PHT): 5.13 cm MV Decel Time: 148 msec MV E velocity: 50.80 cm/s MV A velocity: 51.40 cm/s MV  E/A ratio:  0.99  Dorothye Gathers MD Electronically signed by Dorothye Gathers MD Signature Date/Time: 08/09/2020/5:26:07 PM    Final          ______________________________________________________________________________________________      Risk Assessment/Calculations:            Physical Exam:   VS:  BP 108/72 (BP Location: Left Arm, Patient Position: Sitting, Cuff Size: Normal)   Pulse (!) 58   SpO2 98%    Wt Readings from Last 3 Encounters:  06/01/23 200 lb (90.7 kg)  05/31/23 200 lb (90.7 kg)  04/20/23 200 lb (90.7 kg)    GEN: Well nourished, well developed in no acute distress NECK: No JVD; No carotid bruits CARDIAC: RRR, no murmurs, rubs, gallops RESPIRATORY:  Clear to auscultation without rales, wheezing or rhonchi  ABDOMEN: Soft, non-tender, non-distended EXTREMITIES:  No edema; No deformity   ASSESSMENT AND PLAN: .    Atrial Tachycardia Heart rate improved with metoprolol  succinate, current HR borderline low. - Reduce metoprolol  succinate to 12.5 mg daily.  Chronic Heart Failure with Preserved Ejection Fraction EF 70-75% with grade one diastolic  dysfunction. No heart failure exacerbation symptoms.        Dispo: Follow-up with Dr. Acharya in 4-6 month  Signed, Cameo Shewell, [place]

## 2023-07-01 NOTE — Patient Instructions (Signed)
 Medication Instructions:  DECREASE METOPROLOL SUCCINATE TO 12.5 MG DAILY *If you need a refill on your cardiac medications before your next appointment, please call your pharmacy*  Lab Work: NO LABS If you have labs (blood work) drawn today and your tests are completely normal, you will receive your results only by: MyChart Message (if you have MyChart) OR A paper copy in the mail If you have any lab test that is abnormal or we need to change your treatment, we will call you to review the results.  Testing/Procedures: NO TESTING  Follow-Up: At [facility], you and your health needs are our priority.  As part of our continuing mission to provide you with exceptional heart care, our providers are all part of one team.  This team includes your primary Cardiologist (physician) and Advanced Practice Providers or APPs (Physician Assistants and Nurse Practitioners) who all work together to provide you with the care you need, when you need it.  Your next appointment:   4-6 month(s)  Provider:   Euell Herrlich, MD   We recommend signing up for the patient portal called "MyChart".  Sign up information is provided on this After Visit Summary.  MyChart is used to connect with patients for Virtual Visits (Telemedicine).  Patients are able to view lab/test results, encounter notes, upcoming appointments, etc.  Non-urgent messages can be sent to your provider as well.   To learn more about what you can do with MyChart, go to [email or web address].   Other Instructions   1st Floor: - Lobby - Registration  - Pharmacy  - Lab - Cafe  2nd Floor: - PV Lab - Diagnostic Testing (echo, CT, nuclear med)  3rd Floor: - Vacant  4th Floor: - TCTS (cardiothoracic surgery) - AFib Clinic - Structural Heart Clinic - Vascular Surgery  - Vascular Ultrasound  5th Floor: - HeartCare Cardiology (general and EP) - Clinical Pharmacy for coumadin, hypertension, lipid, weight-loss  medications, and med management appointments    Valet parking services will be available as well.

## 2023-07-29 ENCOUNTER — Inpatient Hospital Stay (HOSPITAL_COMMUNITY)
Admission: EM | Admit: 2023-07-29 | Discharge: 2023-08-02 | DRG: 391 | Disposition: A | Discharge status: Home / Self Care | Attending: Internal Medicine | Admitting: Internal Medicine

## 2023-07-29 ENCOUNTER — Encounter (HOSPITAL_COMMUNITY): Payer: Self-pay

## 2023-07-29 ENCOUNTER — Emergency Department (HOSPITAL_COMMUNITY)

## 2023-07-29 ENCOUNTER — Other Ambulatory Visit: Payer: Self-pay

## 2023-07-29 DIAGNOSIS — Z79899 Other long term (current) drug therapy: Secondary | ICD-10-CM

## 2023-07-29 DIAGNOSIS — I5032 Chronic diastolic (congestive) heart failure: Secondary | ICD-10-CM | POA: Diagnosis present

## 2023-07-29 DIAGNOSIS — Z5320 Procedure and treatment not carried out because of patient's decision for unspecified reasons: Secondary | ICD-10-CM | POA: Diagnosis not present

## 2023-07-29 DIAGNOSIS — G800 Spastic quadriplegic cerebral palsy: Secondary | ICD-10-CM | POA: Diagnosis present

## 2023-07-29 DIAGNOSIS — Z833 Family history of diabetes mellitus: Secondary | ICD-10-CM

## 2023-07-29 DIAGNOSIS — Z8719 Personal history of other diseases of the digestive system: Secondary | ICD-10-CM

## 2023-07-29 DIAGNOSIS — I110 Hypertensive heart disease with heart failure: Secondary | ICD-10-CM | POA: Diagnosis present

## 2023-07-29 DIAGNOSIS — E8809 Other disorders of plasma-protein metabolism, not elsewhere classified: Secondary | ICD-10-CM | POA: Diagnosis present

## 2023-07-29 DIAGNOSIS — Z7985 Long-term (current) use of injectable non-insulin antidiabetic drugs: Secondary | ICD-10-CM

## 2023-07-29 DIAGNOSIS — E118 Type 2 diabetes mellitus with unspecified complications: Secondary | ICD-10-CM | POA: Diagnosis present

## 2023-07-29 DIAGNOSIS — G8250 Quadriplegia, unspecified: Secondary | ICD-10-CM | POA: Diagnosis present

## 2023-07-29 DIAGNOSIS — F32A Depression, unspecified: Secondary | ICD-10-CM | POA: Diagnosis present

## 2023-07-29 DIAGNOSIS — Z880 Allergy status to penicillin: Secondary | ICD-10-CM

## 2023-07-29 DIAGNOSIS — G809 Cerebral palsy, unspecified: Secondary | ICD-10-CM | POA: Diagnosis present

## 2023-07-29 DIAGNOSIS — K5289 Other specified noninfective gastroenteritis and colitis: Principal | ICD-10-CM | POA: Diagnosis present

## 2023-07-29 DIAGNOSIS — G4733 Obstructive sleep apnea (adult) (pediatric): Secondary | ICD-10-CM | POA: Diagnosis present

## 2023-07-29 DIAGNOSIS — Z888 Allergy status to other drugs, medicaments and biological substances status: Secondary | ICD-10-CM

## 2023-07-29 DIAGNOSIS — Z882 Allergy status to sulfonamides status: Secondary | ICD-10-CM

## 2023-07-29 DIAGNOSIS — E876 Hypokalemia: Secondary | ICD-10-CM | POA: Diagnosis present

## 2023-07-29 DIAGNOSIS — J45909 Unspecified asthma, uncomplicated: Secondary | ICD-10-CM | POA: Diagnosis present

## 2023-07-29 DIAGNOSIS — F419 Anxiety disorder, unspecified: Secondary | ICD-10-CM | POA: Diagnosis present

## 2023-07-29 DIAGNOSIS — E871 Hypo-osmolality and hyponatremia: Secondary | ICD-10-CM | POA: Diagnosis not present

## 2023-07-29 DIAGNOSIS — Z8249 Family history of ischemic heart disease and other diseases of the circulatory system: Secondary | ICD-10-CM

## 2023-07-29 DIAGNOSIS — Z7984 Long term (current) use of oral hypoglycemic drugs: Secondary | ICD-10-CM

## 2023-07-29 DIAGNOSIS — E785 Hyperlipidemia, unspecified: Secondary | ICD-10-CM | POA: Diagnosis present

## 2023-07-29 DIAGNOSIS — F418 Other specified anxiety disorders: Secondary | ICD-10-CM | POA: Diagnosis present

## 2023-07-29 DIAGNOSIS — I10 Essential (primary) hypertension: Secondary | ICD-10-CM | POA: Diagnosis present

## 2023-07-29 DIAGNOSIS — D509 Iron deficiency anemia, unspecified: Secondary | ICD-10-CM | POA: Diagnosis not present

## 2023-07-29 DIAGNOSIS — Z87891 Personal history of nicotine dependence: Secondary | ICD-10-CM

## 2023-07-29 DIAGNOSIS — Z7982 Long term (current) use of aspirin: Secondary | ICD-10-CM

## 2023-07-29 DIAGNOSIS — K5641 Fecal impaction: Secondary | ICD-10-CM | POA: Diagnosis present

## 2023-07-29 DIAGNOSIS — K219 Gastro-esophageal reflux disease without esophagitis: Secondary | ICD-10-CM | POA: Diagnosis present

## 2023-07-29 LAB — CBC WITH DIFFERENTIAL/PLATELET
Abs Immature Granulocytes: 0.02 10*3/uL (ref 0.00–0.07)
Basophils Absolute: 0 10*3/uL (ref 0.0–0.1)
Basophils Relative: 1 %
Eosinophils Absolute: 0.1 10*3/uL (ref 0.0–0.5)
Eosinophils Relative: 2 %
HCT: 41.7 % (ref 39.0–52.0)
Hemoglobin: 13.8 g/dL (ref 13.0–17.0)
Immature Granulocytes: 0 %
Lymphocytes Relative: 26 %
Lymphs Abs: 1.5 10*3/uL (ref 0.7–4.0)
MCH: 26.1 pg (ref 26.0–34.0)
MCHC: 33.1 g/dL (ref 30.0–36.0)
MCV: 79 fL — ABNORMAL LOW (ref 80.0–100.0)
Monocytes Absolute: 0.4 10*3/uL (ref 0.1–1.0)
Monocytes Relative: 8 %
Neutro Abs: 3.6 10*3/uL (ref 1.7–7.7)
Neutrophils Relative %: 63 %
Platelets: 230 10*3/uL (ref 150–400)
RBC: 5.28 MIL/uL (ref 4.22–5.81)
RDW: 13.1 % (ref 11.5–15.5)
WBC: 5.6 10*3/uL (ref 4.0–10.5)
nRBC: 0 % (ref 0.0–0.2)

## 2023-07-29 LAB — URINALYSIS, ROUTINE W REFLEX MICROSCOPIC
Bilirubin Urine: NEGATIVE
Glucose, UA: NEGATIVE mg/dL
Hgb urine dipstick: NEGATIVE
Ketones, ur: NEGATIVE mg/dL
Leukocytes,Ua: NEGATIVE
Nitrite: NEGATIVE
Protein, ur: NEGATIVE mg/dL
Specific Gravity, Urine: 1.01 (ref 1.005–1.030)
pH: 5 (ref 5.0–8.0)

## 2023-07-29 LAB — COMPREHENSIVE METABOLIC PANEL WITH GFR
ALT: 8 U/L (ref 0–44)
AST: 16 U/L (ref 15–41)
Albumin: 4 g/dL (ref 3.5–5.0)
Alkaline Phosphatase: 100 U/L (ref 38–126)
Anion gap: 11 (ref 5–15)
BUN: 13 mg/dL (ref 6–20)
CO2: 26 mmol/L (ref 22–32)
Calcium: 9.3 mg/dL (ref 8.9–10.3)
Chloride: 101 mmol/L (ref 98–111)
Creatinine, Ser: 0.92 mg/dL (ref 0.61–1.24)
GFR, Estimated: >60 mL/min (ref >60)
Glucose, Bld: 108 mg/dL — ABNORMAL HIGH (ref 70–99)
Potassium: 3.6 mmol/L (ref 3.5–5.1)
Sodium: 138 mmol/L (ref 135–145)
Total Bilirubin: 0.8 mg/dL (ref 0.0–1.2)
Total Protein: 7 g/dL (ref 6.5–8.1)

## 2023-07-29 LAB — LIPASE, BLOOD: Lipase: 32 U/L (ref 11–51)

## 2023-07-29 MED ORDER — IOHEXOL 350 MG/ML SOLN
75.0000 mL | Freq: Once | INTRAVENOUS | Status: AC | PRN
Start: 2023-07-29 — End: 2023-07-29
  Administered 2023-07-29: 75 mL via INTRAVENOUS

## 2023-07-29 MED ORDER — MAGNESIUM CITRATE PO SOLN
1.0000 | Freq: Once | ORAL | Status: AC
  Administered 2023-07-29: 1 via ORAL
  Filled 2023-07-29: qty 296

## 2023-07-29 NOTE — ED Triage Notes (Signed)
 Pt from home and c/o abd pain for 1 week. C/O n/v

## 2023-07-29 NOTE — ED Provider Notes (Signed)
 Patient was signed out to myself at shift change.  CT scan of the abdomen and pelvis is concerning for fecal impaction as well as stercoral colitis.  Did discuss this with Dr. Staci Dykes with gastroenterology who did recommend disimpaction and enema.  Long discussion was had with patient regarding this and he has declined at this point noting that he does not want a rectal exam or disimpaction and would like to try oral medications.  Patient case was discussed with attending physician who is in agreement to plan for admission to the hospitalist service for optimization of bowel regimen and observation.  Did discuss patient case with the hospitalist Dr. [facility] Lavender who has accepted for admission at this time.   Roselynn Connors, PA-C 07/29/23 [id number]    Rolinda Climes, DO 07/29/23 2211

## 2023-07-29 NOTE — ED Provider Notes (Signed)
 [facility] AT [facility] Provider Note   CSN: [id number] Arrival date & time: 07/29/23  1350     History  Chief Complaint  Patient presents with   Abdominal Pain    Thomas Ramos is a 53 y.o. male presents the emergency department with a chief complaint of abdominal pain, nausea, and vomiting.  Patient states that for about 1 week he has had abdominal pain as well as episodes of nausea and vomiting.  Patient states that last episode of vomiting was on Tuesday.  Patient denies nausea or vomiting yesterday or today.  Patient denies hematemesis, denies diarrhea, denies hematochezia.   Abdominal Pain Associated symptoms: nausea and vomiting   Associated symptoms: no chest pain, no chills, no cough, no diarrhea, no fever and no shortness of breath        Home Medications Prior to Admission medications   Medication Sig Start Date End Date Taking? Authorizing Provider  acetaminophen  (TYLENOL ) 325 MG tablet Take 2 tablets (650 mg total) by mouth every 6 (six) hours as needed for mild pain (pain score 1-3), moderate pain (pain score 4-6) or fever (or Fever >/= 101). 04/23/23   Hongalgi, Anand D, MD  Albuterol  Sulfate (PROAIR  RESPICLICK) 108 (90 Base) MCG/ACT AEPB Inhale 2 puffs into the lungs every 6 (six) hours as needed (Shortness of breath). 05/03/20   [provider]  aspirin  81 MG chewable tablet Chew 81 mg by mouth daily.    [provider]  baclofen  (LIORESAL ) 10 MG tablet Take 2.5 mg by mouth at bedtime. 06/01/18   [provider]  Dulaglutide 0.75 MG/0.5ML SOPN Inject 0.75 mg into the skin once a week. Wednesday 02/26/20   [provider]  furosemide  (LASIX ) 20 MG tablet Take 20 mg by mouth daily. 10/23/20   [provider]  latanoprost  (XALATAN ) 0.005 % ophthalmic solution Place 1 drop into both eyes at bedtime. 08/25/21   [provider]  metoprolol  succinate (TOPROL -XL) 25 MG 24 hr tablet Take 0.5  tablets (12.5 mg total) by mouth daily. 07/01/23   Meng, Hao, PA  montelukast  (SINGULAIR ) 10 MG tablet Take 1 tablet by mouth daily. 07/28/21   [provider]  nitroGLYCERIN  (NITROSTAT ) 0.4 MG SL tablet Place 0.4 mg under the tongue every 5 (five) minutes as needed for chest pain.    [provider]  omeprazole  (PRILOSEC) 20 MG capsule Take 1 capsule by mouth every morning. 12/12/19   [provider]  rosuvastatin  (CRESTOR ) 5 MG tablet Take 5 mg by mouth at bedtime.    [provider]  timolol  (TIMOPTIC ) 0.5 % ophthalmic solution Place 1 drop into both eyes 2 (two) times daily. 04/10/20   [provider]  traMADol  (ULTRAM ) 50 MG tablet Take 1 tablet (50 mg total) by mouth every 12 (twelve) hours as needed for severe pain (pain score 7-10). 04/26/23   Hongalgi, Thomasene Flemings, MD      Allergies    Penicillins, Sulfamethoxazole -trimethoprim , and Metronidazole     Review of Systems   Review of Systems  Constitutional:  Negative for chills and fever.  Respiratory:  Negative for cough, chest tightness and shortness of breath.   Cardiovascular:  Negative for chest pain.  Gastrointestinal:  Positive for abdominal pain, nausea and vomiting. Negative for abdominal distention, blood in stool and diarrhea.  Genitourinary:  Negative for flank pain.  Skin:  Negative for rash and wound.    Physical Exam Updated Vital Signs BP 110/77   Pulse  78   Temp 97.6 F (36.4 C) (Oral)   Resp 20   SpO2 98%  Physical Exam Vitals and nursing note reviewed.  Constitutional:      General: He is awake. He is not in acute distress.    Appearance: He is not ill-appearing, toxic-appearing or diaphoretic.  HENT:     Head: Normocephalic and atraumatic.  Eyes:     General: No scleral icterus. Cardiovascular:     Rate and Rhythm: Normal rate and regular rhythm.  Pulmonary:     Effort: Pulmonary effort is normal. No respiratory distress.     Breath sounds: Normal breath sounds.  No wheezing, rhonchi or rales.  Abdominal:     General: Abdomen is flat. Bowel sounds are normal. There is no distension.     Palpations: Abdomen is soft.     Tenderness: There is no abdominal tenderness. There is no right CVA tenderness, left CVA tenderness, guarding or rebound. Negative signs include Murphy's sign and McBurney's sign.  Skin:    General: Skin is warm and dry.     Capillary Refill: Capillary refill takes less than 2 seconds.  Neurological:     Mental Status: He is alert and oriented to person, place, and time.     Comments: Patient has history of congenital cerebral palsy  Psychiatric:        Behavior: Behavior is cooperative.     ED Results / Procedures / Treatments   Labs (all labs ordered are listed, but only abnormal results are displayed) Labs Reviewed  CBC WITH DIFFERENTIAL/PLATELET  COMPREHENSIVE METABOLIC PANEL WITH GFR  LIPASE, BLOOD  URINALYSIS, ROUTINE W REFLEX MICROSCOPIC    EKG None  Radiology No results found.  Procedures Procedures    Medications Ordered in ED Medications - No data to display  ED Course/ Medical Decision Making/ A&P    Patient presents to the ED for concern of abdominal pain, nausea, vomiting, this involves an extensive number of treatment options, and is a complaint that carries with it a high risk of complications and morbidity.  The differential diagnosis includes pancreatitis, appendicitis, hepatitis, stomach ulcer, small bowel obstruction, GI bleed, etc.   Co morbidities that complicate the patient evaluation  Congenital cerebral palsy   Lab Tests:  I Ordered, and personally interpreted labs.  The pertinent results include: CBC-unremarkable, UA - PENDING, Lipase - PENDING, CMP - unremarkable other than glucose of 108   Imaging Studies ordered:  I ordered imaging studies including CT abdomen and pelvis - PENDING I independently visualized and interpreted imaging which showed  I agree with the radiologist  interpretation   Medicines ordered and prescription drug management: I have reviewed the patients home medicines and have made adjustments as needed    Critical Interventions:  none   Problem List / ED Course:  Abdominal pain, nausea, vomiting Patient presents with nonspecific abdominal pain x 1 week with associated nausea and vomiting.  Patient states that last episode of nausea and vomiting was on Tuesday.  Patient denies nausea yesterday or today.  Denies hematemesis, denies diarrhea, denies hematochezia.  Patient has history significant for congenital cerebral palsy. On exam no real pain elicited with palpation, no rebound, no guarding, abdomen soft nontender, no CVA tenderness, no bladder tenderness, patient denies urinary symptoms Labs ordered including CBC, CMP, UA, lipase CT abdomen pelvis ordered for thoroughness Patient signed out at shift change with work-up pending   Reevaluation:  After the interventions noted above, I reevaluated the patient and found that  they have :stayed the same   Social Determinants of Health:  none   Dispostion:  Sign out given to Christopher Rigney PA-C for ongoing diagnosis and treatment.   Click here for ABCD2, HEART and other calculatorsREFRESH Note before signing :1}                              Medical Decision Making Amount and/or Complexity of Data Reviewed Labs: ordered. Radiology: ordered.          Final Clinical Impression(s) / ED Diagnoses Final diagnoses:  None    Rx / DC Orders ED Discharge Orders     None         Fonda Hymen, PA-C 07/29/23 1534    Kingsley, Victoria K, DO 07/30/23 1557

## 2023-07-30 ENCOUNTER — Encounter (HOSPITAL_COMMUNITY): Payer: Self-pay | Admitting: Internal Medicine

## 2023-07-30 DIAGNOSIS — Z8249 Family history of ischemic heart disease and other diseases of the circulatory system: Secondary | ICD-10-CM | POA: Diagnosis not present

## 2023-07-30 DIAGNOSIS — G809 Cerebral palsy, unspecified: Secondary | ICD-10-CM

## 2023-07-30 DIAGNOSIS — G800 Spastic quadriplegic cerebral palsy: Secondary | ICD-10-CM | POA: Diagnosis present

## 2023-07-30 DIAGNOSIS — Z882 Allergy status to sulfonamides status: Secondary | ICD-10-CM | POA: Diagnosis not present

## 2023-07-30 DIAGNOSIS — E8809 Other disorders of plasma-protein metabolism, not elsewhere classified: Secondary | ICD-10-CM | POA: Diagnosis present

## 2023-07-30 DIAGNOSIS — Z7985 Long-term (current) use of injectable non-insulin antidiabetic drugs: Secondary | ICD-10-CM | POA: Diagnosis not present

## 2023-07-30 DIAGNOSIS — Z880 Allergy status to penicillin: Secondary | ICD-10-CM | POA: Diagnosis not present

## 2023-07-30 DIAGNOSIS — K219 Gastro-esophageal reflux disease without esophagitis: Secondary | ICD-10-CM | POA: Diagnosis present

## 2023-07-30 DIAGNOSIS — E785 Hyperlipidemia, unspecified: Secondary | ICD-10-CM | POA: Diagnosis present

## 2023-07-30 DIAGNOSIS — E876 Hypokalemia: Secondary | ICD-10-CM | POA: Diagnosis present

## 2023-07-30 DIAGNOSIS — J45909 Unspecified asthma, uncomplicated: Secondary | ICD-10-CM | POA: Diagnosis present

## 2023-07-30 DIAGNOSIS — E118 Type 2 diabetes mellitus with unspecified complications: Secondary | ICD-10-CM | POA: Diagnosis not present

## 2023-07-30 DIAGNOSIS — K5641 Fecal impaction: Secondary | ICD-10-CM | POA: Diagnosis present

## 2023-07-30 DIAGNOSIS — G4733 Obstructive sleep apnea (adult) (pediatric): Secondary | ICD-10-CM | POA: Diagnosis present

## 2023-07-30 DIAGNOSIS — I5032 Chronic diastolic (congestive) heart failure: Secondary | ICD-10-CM

## 2023-07-30 DIAGNOSIS — K5289 Other specified noninfective gastroenteritis and colitis: Secondary | ICD-10-CM | POA: Diagnosis present

## 2023-07-30 DIAGNOSIS — I10 Essential (primary) hypertension: Secondary | ICD-10-CM

## 2023-07-30 DIAGNOSIS — Z7982 Long term (current) use of aspirin: Secondary | ICD-10-CM | POA: Diagnosis not present

## 2023-07-30 DIAGNOSIS — E871 Hypo-osmolality and hyponatremia: Secondary | ICD-10-CM | POA: Diagnosis not present

## 2023-07-30 DIAGNOSIS — F419 Anxiety disorder, unspecified: Secondary | ICD-10-CM | POA: Diagnosis present

## 2023-07-30 DIAGNOSIS — F418 Other specified anxiety disorders: Secondary | ICD-10-CM

## 2023-07-30 DIAGNOSIS — Z79899 Other long term (current) drug therapy: Secondary | ICD-10-CM | POA: Diagnosis not present

## 2023-07-30 DIAGNOSIS — Z87891 Personal history of nicotine dependence: Secondary | ICD-10-CM | POA: Diagnosis not present

## 2023-07-30 DIAGNOSIS — I110 Hypertensive heart disease with heart failure: Secondary | ICD-10-CM | POA: Diagnosis present

## 2023-07-30 DIAGNOSIS — Z833 Family history of diabetes mellitus: Secondary | ICD-10-CM | POA: Diagnosis not present

## 2023-07-30 DIAGNOSIS — F32A Depression, unspecified: Secondary | ICD-10-CM | POA: Diagnosis present

## 2023-07-30 DIAGNOSIS — D509 Iron deficiency anemia, unspecified: Secondary | ICD-10-CM | POA: Diagnosis not present

## 2023-07-30 LAB — CBC WITH DIFFERENTIAL/PLATELET
Abs Immature Granulocytes: 0.02 10*3/uL (ref 0.00–0.07)
Basophils Absolute: 0 10*3/uL (ref 0.0–0.1)
Basophils Relative: 1 %
Eosinophils Absolute: 0.1 10*3/uL (ref 0.0–0.5)
Eosinophils Relative: 1 %
HCT: 38 % — ABNORMAL LOW (ref 39.0–52.0)
Hemoglobin: 12.4 g/dL — ABNORMAL LOW (ref 13.0–17.0)
Immature Granulocytes: 0 %
Lymphocytes Relative: 23 %
Lymphs Abs: 1.3 10*3/uL (ref 0.7–4.0)
MCH: 26 pg (ref 26.0–34.0)
MCHC: 32.6 g/dL (ref 30.0–36.0)
MCV: 79.7 fL — ABNORMAL LOW (ref 80.0–100.0)
Monocytes Absolute: 0.5 10*3/uL (ref 0.1–1.0)
Monocytes Relative: 8 %
Neutro Abs: 3.9 10*3/uL (ref 1.7–7.7)
Neutrophils Relative %: 67 %
Platelets: 210 10*3/uL (ref 150–400)
RBC: 4.77 MIL/uL (ref 4.22–5.81)
RDW: 13.1 % (ref 11.5–15.5)
WBC: 5.8 10*3/uL (ref 4.0–10.5)
nRBC: 0 % (ref 0.0–0.2)

## 2023-07-30 LAB — COMPREHENSIVE METABOLIC PANEL WITH GFR
ALT: 8 U/L (ref 0–44)
AST: 12 U/L — ABNORMAL LOW (ref 15–41)
Albumin: 3.5 g/dL (ref 3.5–5.0)
Alkaline Phosphatase: 96 U/L (ref 38–126)
Anion gap: 10 (ref 5–15)
BUN: 14 mg/dL (ref 6–20)
CO2: 27 mmol/L (ref 22–32)
Calcium: 8.9 mg/dL (ref 8.9–10.3)
Chloride: 100 mmol/L (ref 98–111)
Creatinine, Ser: 0.84 mg/dL (ref 0.61–1.24)
GFR, Estimated: >60 mL/min (ref >60)
Glucose, Bld: 108 mg/dL — ABNORMAL HIGH (ref 70–99)
Potassium: 3.4 mmol/L — ABNORMAL LOW (ref 3.5–5.1)
Sodium: 137 mmol/L (ref 135–145)
Total Bilirubin: 0.7 mg/dL (ref 0.0–1.2)
Total Protein: 6.3 g/dL — ABNORMAL LOW (ref 6.5–8.1)

## 2023-07-30 LAB — GLUCOSE, CAPILLARY
Glucose-Capillary: 170 mg/dL — ABNORMAL HIGH (ref 70–99)
Glucose-Capillary: 87 mg/dL (ref 70–99)
Glucose-Capillary: 135 mg/dL — ABNORMAL HIGH (ref 70–99)
Glucose-Capillary: 138 mg/dL — ABNORMAL HIGH (ref 70–99)

## 2023-07-30 LAB — HIV ANTIBODY (ROUTINE TESTING W REFLEX): HIV Screen 4th Generation wRfx: NONREACTIVE

## 2023-07-30 MED ORDER — ROSUVASTATIN CALCIUM 5 MG PO TABS
5.0000 mg | ORAL_TABLET | Freq: Every day | ORAL | Status: DC
  Administered 2023-07-30 – 2023-08-01 (×3): 5 mg via ORAL
  Filled 2023-07-30 (×3): qty 1

## 2023-07-30 MED ORDER — LATANOPROST 0.005 % OP SOLN
1.0000 [drp] | Freq: Every day | OPHTHALMIC | Status: DC
  Administered 2023-07-30 – 2023-08-01 (×3): 1 [drp] via OPHTHALMIC
  Filled 2023-07-30: qty 2.5

## 2023-07-30 MED ORDER — PANTOPRAZOLE SODIUM 40 MG PO TBEC
40.0000 mg | DELAYED_RELEASE_TABLET | Freq: Every day | ORAL | Status: DC
  Administered 2023-07-30 – 2023-08-02 (×4): 40 mg via ORAL
  Filled 2023-07-30 (×4): qty 1

## 2023-07-30 MED ORDER — POTASSIUM CHLORIDE CRYS ER 20 MEQ PO TBCR
40.0000 meq | EXTENDED_RELEASE_TABLET | Freq: Two times a day (BID) | ORAL | Status: AC
  Administered 2023-07-30 (×2): 40 meq via ORAL
  Filled 2023-07-30 (×2): qty 2

## 2023-07-30 MED ORDER — INSULIN ASPART 100 UNIT/ML IJ SOLN
0.0000 [IU] | Freq: Three times a day (TID) | INTRAMUSCULAR | Status: DC
  Administered 2023-07-30: 1 [IU] via SUBCUTANEOUS
  Administered 2023-07-30 – 2023-08-01 (×2): 2 [IU] via SUBCUTANEOUS
  Administered 2023-08-02: 1 [IU] via SUBCUTANEOUS

## 2023-07-30 MED ORDER — METOPROLOL SUCCINATE ER 25 MG PO TB24
25.0000 mg | ORAL_TABLET | Freq: Every day | ORAL | Status: DC
  Administered 2023-07-30 – 2023-08-02 (×4): 25 mg via ORAL
  Filled 2023-07-30 (×4): qty 1

## 2023-07-30 MED ORDER — ALBUTEROL SULFATE (2.5 MG/3ML) 0.083% IN NEBU: 3.0000 mL | INHALATION_SOLUTION | Freq: Four times a day (QID) | RESPIRATORY_TRACT | Status: DC | PRN

## 2023-07-30 MED ORDER — SENNOSIDES-DOCUSATE SODIUM 8.6-50 MG PO TABS
1.0000 | ORAL_TABLET | Freq: Two times a day (BID) | ORAL | Status: DC
  Administered 2023-07-30 – 2023-08-02 (×7): 1 via ORAL
  Filled 2023-07-30 (×7): qty 1

## 2023-07-30 MED ORDER — AMLODIPINE BESYLATE 10 MG PO TABS
10.0000 mg | ORAL_TABLET | Freq: Every day | ORAL | Status: DC
  Administered 2023-08-01 – 2023-08-02 (×2): 10 mg via ORAL
  Filled 2023-07-30 (×4): qty 1

## 2023-07-30 MED ORDER — POLYETHYLENE GLYCOL 3350 17 G PO PACK
17.0000 g | PACK | Freq: Two times a day (BID) | ORAL | Status: DC
  Administered 2023-07-30 – 2023-08-02 (×7): 17 g via ORAL
  Filled 2023-07-30 (×7): qty 1

## 2023-07-30 MED ORDER — ENOXAPARIN SODIUM 40 MG/0.4ML IJ SOSY
40.0000 mg | PREFILLED_SYRINGE | INTRAMUSCULAR | Status: DC
  Administered 2023-07-30 – 2023-08-02 (×4): 40 mg via SUBCUTANEOUS
  Filled 2023-07-30 (×4): qty 0.4

## 2023-07-30 MED ORDER — POLYETHYLENE GLYCOL 3350 17 G PO PACK
17.0000 g | PACK | Freq: Once | ORAL | Status: AC
  Administered 2023-07-30: 17 g via ORAL
  Filled 2023-07-30: qty 1

## 2023-07-30 MED ORDER — SODIUM CHLORIDE 0.9 % IV SOLN
2.0000 g | INTRAVENOUS | Status: DC
  Administered 2023-07-30 – 2023-08-02 (×4): 2 g via INTRAVENOUS
  Filled 2023-07-30 (×4): qty 20

## 2023-07-30 MED ORDER — TIMOLOL MALEATE 0.5 % OP SOLN
1.0000 [drp] | Freq: Two times a day (BID) | OPHTHALMIC | Status: DC
  Administered 2023-07-30 – 2023-08-02 (×6): 1 [drp] via OPHTHALMIC
  Filled 2023-07-30: qty 5

## 2023-07-30 MED ORDER — MONTELUKAST SODIUM 10 MG PO TABS
10.0000 mg | ORAL_TABLET | Freq: Every day | ORAL | Status: DC
  Administered 2023-07-30 – 2023-08-02 (×4): 10 mg via ORAL
  Filled 2023-07-30 (×4): qty 1

## 2023-07-30 MED ORDER — BISACODYL 10 MG RE SUPP
10.0000 mg | Freq: Once | RECTAL | Status: AC
  Administered 2023-07-30: 10 mg via RECTAL
  Filled 2023-07-30: qty 1

## 2023-07-30 MED ORDER — BISACODYL 10 MG RE SUPP
10.0000 mg | Freq: Every day | RECTAL | Status: DC | PRN
  Administered 2023-08-02: 10 mg via RECTAL

## 2023-07-30 MED ORDER — FUROSEMIDE 20 MG PO TABS
20.0000 mg | ORAL_TABLET | Freq: Every day | ORAL | Status: DC
  Administered 2023-07-30 – 2023-08-02 (×4): 20 mg via ORAL
  Filled 2023-07-30 (×4): qty 1

## 2023-07-30 NOTE — ED Notes (Signed)
 Called floor and advised patient will be in route to floor they were receptive.

## 2023-07-30 NOTE — Care Management Obs Status (Signed)
 MEDICARE OBSERVATION STATUS NOTIFICATION   Patient Details  Name: Thomas Ramos MRN: [id number] Date of Birth: Sep 27, 1970   Medicare Observation Status Notification Given:  Yes    Terre Ferri, RN 07/30/2023, 1:09 PM

## 2023-07-30 NOTE — Plan of Care (Signed)

## 2023-07-30 NOTE — TOC Initial Note (Signed)
 Transition of Care (TOC) - Initial/Assessment Note   Spoke to patient at bedside. From home alone with caregiver Blanchie Bunkers.   Patient will need ambulance transport home at discharge. Confirmed address.   [place] has wheelchair, hoyer lift and hospital bed at home.  Patient Details  Name: Thomas Ramos MRN: [id number] Date of Birth: 11-29-1970  [facility]) CM/SW Contact:    Terre Ferri, RN Phone Number: 07/30/2023, 1:15 PM  Clinical Narrative:                   Expected Discharge Plan: Home/Self Care (has caregiver) Barriers to Discharge: Continued Medical Work up   Patient Goals and CMS Choice Patient states their goals for this hospitalization and ongoing recovery are:: to return to home   Choice offered to / list presented to : NA      Expected Discharge Plan and Services   Discharge Planning Services: CM Consult Post Acute Care Choice: NA Living arrangements for the past 2 months: Apartment                 DME Arranged: N/A DME Agency: NA       HH Arranged: NA HH Agency: NA        Prior Living Arrangements/Services Living arrangements for the past 2 months: Apartment Lives with:: Self Patient language and need for interpreter reviewed:: Yes Do you feel safe going back to the place where you live?: Yes      Need for Family Participation in Patient Care: Yes (Comment) Care giver support system in place?: Yes (comment) Current home services: DME Criminal Activity/Legal Involvement Pertinent to Current Situation/Hospitalization: No - Comment as needed  Activities of Daily Living   ADL Screening (condition at time of admission) Independently performs ADLs?: No Does the patient have a NEW difficulty with bathing/dressing/toileting/self-feeding that is expected to last >3 days?: No Does the patient have a NEW difficulty with getting in/out of bed, walking, or climbing stairs that is expected to last >3 days?: No Does the patient have  a NEW difficulty with communication that is expected to last >3 days?: No Is the patient deaf or have difficulty hearing?: No Does the patient have difficulty seeing, even when wearing glasses/contacts?: No Does the patient have difficulty concentrating, remembering, or making decisions?: No  Permission Sought/Granted   Permission granted to share information with : No              Emotional Assessment Appearance:: Appears stated age Attitude/Demeanor/Rapport: Engaged Affect (typically observed): Accepting Orientation: : Oriented to Self, Oriented to Place, Oriented to  Time, Oriented to Situation Alcohol / Substance Use: Not Applicable Psych Involvement: No (comment)  Admission diagnosis:  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] Patient Active Problem List   Diagnosis Date Noted   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   Left leg cellulitis 04/20/2023   Heart murmur 02/26/2020   Hiatal hernia    Type 2 diabetes mellitus with complication, without long-term current use of insulin  (HCC) 08/03/2017   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HCC)    Pressure injury of skin 08/16/2016   Onychomycosis 02/20/2016   Essential hypertension    OSA (obstructive sleep apnea) 12/11/2015   Chronic diastolic (congestive) heart failure (HCC)    Chronic venous insufficiency    Depression with anxiety 09/20/2015   Hypersomnia 07/16/2015   GERD (gastroesophageal reflux disease) 07/16/2015   Snoring 07/16/2015   Hyperlipidemia 02/22/2012   Dysphagia 09/16/2010   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HCC) 09/16/2010   PCP:  Jolena Nay,  Arlinda Lais, PA-C Pharmacy:   [facility] [place], [place] - [address] [place] [address] Phone: [phone] Fax: [phone]  [facility] [address] [place] [address] Phone: [phone] Fax: [phone]     Social Drivers of Health (SDOH) Social History: SDOH Screenings   Food Insecurity:  No Food Insecurity (07/30/2023)  Housing: Low Risk  (07/30/2023)  Transportation Needs: No Transportation Needs (07/30/2023)  Utilities: Not At Risk (07/30/2023)  Financial Resource Strain: Low Risk  (04/20/2023)   Received from [facility]  Physical Activity: Unknown (03/31/2022)   Received from [facility], [facility]  Social Connections: Moderately Integrated (03/31/2022)   [facility]  Stress: No Stress Concern Present (03/31/2022)   [facility]  Tobacco Use: Medium Risk (07/30/2023)   SDOH Interventions:     Readmission Risk Interventions    07/15/2022    2:11 PM  Readmission Risk Prevention Plan  Post Dischage Appt Complete  Medication Screening Complete  Transportation Screening Complete

## 2023-07-30 NOTE — Hospital Course (Addendum)
 The patient is a 53 year old chronically ill-appearing Caucasian male with a past medical history significant for but not limited to HTN, HLD, DMT2, chronic heart failure with preserved ejection fraction, history of cerebral palsy and spastic quadriplegia who lives alone with the help of caregiver moves around with the help of wheelchair was recently admitted for cellulitis of lower extremity who presented to the ER this time for abdominal discomfort of last 4 to 5 days.  Had not moved his bowels in over about a week and had an episode of vomiting about 5 days ago.  In the ED CT scan of the abdomen pelvis was done and showed a very large stool burden and features concerning for stercoral colitis.  He refused fecal disimpaction or enema and was given mag citrate orally and admitted for further evaluation.  The case was discussed with GI who recommended conservative measures and continuing laxatives. He continued to have a large stool burden on repeat imaging so will continue bowel regimen and have ordered another bottle of Mag Citrate.   Assessment and Plan:  Abdominal Pain likely from large stool burden with fecal impaction and stercoral colitis. Improving. Patient was refusing any fecal disimpaction in the ER initially.  Was given mag citrate following which patient had a small bowel movement. Still refusing Enema. C/w Bowel regimen w/ Senna-Docusate 1 tab po BID, Mirlax 17 gram po BID, Bisacodyl  Suppositories and add another bottle of Mag Citrate. KUB yesterday showed AM showed Large amount of stool seen in rectum concerning for impaction and no abnormal dilation and today it showed Diffuse gaseous distention of small bowel and colon with Rectal stool volume appearing to be decreased in the interval. On IV Ceftriaxone  and will be changed to oral Cipro  Flagyl .Aaron Aas  He is improved and having multiple bowel movements and repeat KUB done and showed mild colonic stool burden and no abnormal dilatation.  Diabetes  mellitus type 2: Takes semaglutide.  Last hemoglobin A1c was 5.9  3 months ago.  C/w Sensitive Novolog  SSI AC. CTM CBGs per Protocol. CBGs ranging from 104-127 on last 3 checks  Essential Hypertension: C/w Metoprolol  Succinate 25 mg po Daily, Furosemide  20 mg po Daily, and Amlodipine  10 mg po Daily. CTM BP per Protocol. Last BP reading was 104/74  Hypokalemia: Improved. K+ is now 4.1. CTM and Replete as Necessary. Repeat CMP in the AM   HypoNa+ : Na+ went from 137 -> 134 -> 131 and is now 134. CTM and Trend and repeat CMP in the AM  Hyperlipidemia: C/w Rosuvastatin  5 mg po qHS  Chronic HFpEF: C/w Furosemide  20 mg po Daily. Strict I's and O's and Daily Weights. CTM for S/Sx of Volume overload; He is -410 mL since admission   History of Cerebral Palsy: C/w Supportive Care; Has a Chronic foley change in outpatient  Microcytic Anemia: Hgb/Hct is now 13.5/41.1 w/ MCV of 79.5.  Anemia Panel done and showed an iron level of 80, UIBC of 311, TIBC 391, saturation ratio 21%, ferritin level 8%, folate 10.4, vitamin B12 level 212. CTM for S/Sx of Bleeding; No overt bleeding noted. Repeat CBC in the AM  GERD/GI Prophylaxis: C/w PPI with Pantoprazole  40 mg po Daily  Hypoalbuminemia: Patient's Albumin Level was trending down but now went from 4.0 -> 3.5 -> 3.4 -> 3.6 -> 3.2. CTM and Trend and repeat CMP in the AM

## 2023-07-30 NOTE — H&P (Signed)
 History and Physical    Thomas Ramos VZD:[id number] DOB: June 14, 1970 DOA: 07/29/2023  Patient coming from: Home.  Chief Complaint: Abdominal discomfort.  HPI: Thomas Ramos is a 53 y.o. male with history of hypertension, hyperlipidemia, diabetes mellitus type 2, chronic HFpEF, cerebral palsy and spastic quadriplegia lives alone with help of a caregiver moves around with help of wheelchair recently admitted in February 2025 for cellulitis of the lower extremity presents to the ER with complaints of abdominal discomfort for the last 4 to 5 days.  Has not moved his bowels for almost a week.  Had 1 episode of vomiting about 5 days ago.  ED Course: In the ER CT abdomen pelvis shows large stool burden and features concerning for stercoral colitis.  Labs are largely unremarkable.  Patient was refusing fecal disimpaction or any enema.  So was given mag citrate orally.  Admitted for further observation.  ER physician did discuss with on-called gastroenterology who recommended same plan.  Review of Systems: As per HPI, rest all negative.   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HCC)    S/P Botox  injection    approx every 4 months   Seasonal allergies     Past Surgical History:  Procedure Laterality Date   BIOPSY  06/24/2021   Procedure: BIOPSY;  Surgeon: Albertina Hugger, MD;  Location: [facility];  Service: Gastroenterology;;   BOTOX  INJECTION N/A 12/21/2012   Procedure: BOTOX  INJECTION;  Surgeon: Claudette Cue, MD;  Location: [facility]  ENDOSCOPY;  Service: Endoscopy;  Laterality: N/A;   BOTOX  INJECTION N/A 06/28/2014   Procedure: BOTOX  INJECTION;  Surgeon: Claudette Cue, MD;  Location: [facility] ENDOSCOPY;  Service: Endoscopy;  Laterality: N/A;   COLONOSCOPY WITH PROPOFOL  N/A 06/24/2021   Procedure: COLONOSCOPY WITH PROPOFOL ;  Surgeon: Albertina Hugger, MD;  Location: [facility];  Service: Gastroenterology;  Laterality: N/A;   ESOPHAGOGASTRODUODENOSCOPY N/A 12/21/2012   Procedure: ESOPHAGOGASTRODUODENOSCOPY (EGD);  Surgeon: Claudette Cue, MD;  Location: [facility];  Service: Endoscopy;  Laterality: N/A;   ESOPHAGOGASTRODUODENOSCOPY N/A 06/28/2014   Procedure: ESOPHAGOGASTRODUODENOSCOPY (EGD);  Surgeon: Claudette Cue, MD;  Location: [facility]COPY;  Service: Endoscopy;  Laterality: N/A;   ESOPHAGOGASTRODUODENOSCOPY N/A 06/24/2021   Procedure: ESOPHAGOGASTRODUODENOSCOPY (EGD);  Surgeon: Albertina Hugger, MD;  Location: [facility];  Service: Gastroenterology;  Laterality: N/A;   ESOPHAGOGASTRODUODENOSCOPY (EGD) WITH PROPOFOL  N/A 09/03/2017   Procedure: ESOPHAGOGASTRODUODENOSCOPY (EGD) WITH PROPOFOL ;  Surgeon: Albertina Hugger, MD;  Location: [facility] END[facility];  Service: Gastroenterology;  Laterality: N/A;   ESOPHAGUS SURGERY     stretched esophagus   legs     MOUTH SURGERY     POLYPECTOMY  06/24/2021   Procedure: POLYPECTOMY;  Surgeon: Albertina Hugger, MD;  Location: [facility] END[facility];  Service: Gastroenterology;;   TENDON RELEASE       reports that he has never smoked. He has quit using smokeless tobacco. He reports current alcohol use. He reports that  he does not use drugs.  Allergies  Allergen Reactions   Penicillins Hives, Nausea And Vomiting and Other (See Comments)    Patient tolerated cefazolin  in 2017   Sulfamethoxazole -Trimethoprim  Nausea And Vomiting   Metronidazole  Nausea And Vomiting    Family History  Problem Relation Age of Onset   Hypertension Father    Lung cancer Father    Diabetes Mother     Prior  to Admission medications   Medication Sig Start Date End Date Taking? Authorizing Provider  acetaminophen  (TYLENOL ) 325 MG tablet Take 2 tablets (650 mg total) by mouth every 6 (six) hours as needed for mild pain (pain score 1-3), moderate pain (pain score 4-6) or fever (or Fever >/= 101). 04/23/23  Yes Hongalgi, Anand D, MD  Albuterol  Sulfate (PROAIR  RESPICLICK) 108 (90 Base) MCG/ACT AEPB Inhale 2 puffs into the lungs every 6 (six) hours as needed (Shortness of breath). 05/03/20  Yes [provider]  amLODipine  (NORVASC ) 10 MG tablet Take 10 mg by mouth daily. 07/19/23  Yes [provider]  baclofen  (LIORESAL ) 10 MG tablet Take 2.5 mg by mouth at bedtime. 06/01/18  Yes [provider]  furosemide  (LASIX ) 20 MG tablet Take 20 mg by mouth daily. 10/23/20  Yes [provider]  latanoprost  (XALATAN ) 0.005 % ophthalmic solution Place 1 drop into both eyes at bedtime. 08/25/21  Yes [provider]  metoprolol  succinate (TOPROL -XL) 25 MG 24 hr tablet Take 0.5 tablets (12.5 mg total) by mouth daily. Patient taking differently: Take 25 mg by mouth daily. 07/01/23  Yes Meng, Hao, PA  montelukast  (SINGULAIR ) 10 MG tablet Take 1 tablet by mouth daily. 07/28/21  Yes [provider]  nitroGLYCERIN  (NITROSTAT ) 0.4 MG SL tablet Place 0.4 mg under the tongue every 5 (five) minutes as needed for chest pain.   Yes [provider]  omeprazole  (PRILOSEC) 20 MG capsule Take 1 capsule by mouth every morning. 12/12/19  Yes [provider]  rosuvastatin  (CRESTOR ) 5 MG tablet Take 5 mg by mouth at bedtime.   Yes [provider]  Semaglutide,0.25 or 0.5MG /DOS, 2 MG/3ML SOPN Inject 0.25 mg into the skin once a week. 06/15/23  Yes [provider]  timolol  (TIMOPTIC ) 0.5 % ophthalmic solution Place 1 drop into both eyes 2 (two) times daily. 04/10/20  Yes [provider]  aspirin  81 MG chewable tablet Chew 81 mg by mouth daily. Patient not  taking: Reported on 07/29/2023    [provider]    Physical Exam: Constitutional: Moderately built and nourished. Vitals:   07/29/23 1404 07/29/23 1745 07/29/23 2229  BP: 110/77 (!) 140/92 121/74  Pulse: 78 67 76  Resp: 20 18 16   Temp: 97.6 F (36.4 C) 97.7 F (36.5 C) 97.7 F (36.5 C)  TempSrc: Oral Oral Oral  SpO2: 98% 99% 95%   Eyes: Anicteric no pallor. ENMT: No discharge from the ears/nose or mouth. Neck: No mass felt.  No neck rigidity. Respiratory: No rhonchi or crepitations. Cardiovascular: S1-S2 heard. Abdomen: Soft mildly distended nontender. Musculoskeletal: Has contractures of the lower extremities and upper extremity right side. Skin: No rash. Neurologic: Alert awake oriented to time place and person.   Psychiatric: Appears normal.  Normal affect.   Labs on Admission: I have personally reviewed following labs and imaging studies  CBC: Recent Labs  Lab 07/29/23 1416  WBC 5.6  NEUTROABS 3.6  HGB 13.8  HCT 41.7  MCV 79.0*  PLT 230   Basic Metabolic Panel: Recent Labs  Lab  07/29/23 1416  NA 138  K 3.6  CL 101  CO2 26  GLUCOSE 108*  BUN 13  CREATININE 0.92  CALCIUM  9.3   GFR: CrCl cannot be calculated (Unknown ideal weight.). Liver Function Tests: Recent Labs  Lab 07/29/23 1416  AST 16  ALT 8  ALKPHOS 100  BILITOT 0.8  PROT 7.0  ALBUMIN 4.0   Recent Labs  Lab 07/29/23 1416  LIPASE 32   No results for input(s): "AMMONIA" in the last 168 hours. Coagulation Profile: No results for input(s): "INR", "PROTIME" in the last 168 hours. Cardiac Enzymes: No results for input(s): "CKTOTAL", "CKMB", "CKMBINDEX", "TROPONINI" in the last 168 hours. BNP (last 3 results) No results for input(s): "PROBNP" in the last 8760 hours. HbA1C: No results for input(s): "HGBA1C" in the last 72 hours. CBG: No results for input(s): "GLUCAP" in the last 168 hours. Lipid Profile: No results for input(s): "CHOL", "HDL", "LDLCALC", "TRIG",  "CHOLHDL", "LDLDIRECT" in the last 72 hours. Thyroid Function Tests: No results for input(s): "TSH", "T4TOTAL", "FREET4", "T3FREE", "THYROIDAB" in the last 72 hours. Anemia Panel: No results for input(s): "VITAMINB12", "FOLATE", "FERRITIN", "TIBC", "IRON", "RETICCTPCT" in the last 72 hours. Urine analysis:    Component Value Date/Time   COLORURINE YELLOW 07/29/2023 1705   APPEARANCEUR CLEAR 07/29/2023 1705   LABSPEC 1.010 07/29/2023 1705   PHURINE 5.0 07/29/2023 1705   GLUCOSEU NEGATIVE 07/29/2023 1705   HGBUR NEGATIVE 07/29/2023 1705   BILIRUBINUR NEGATIVE 07/29/2023 1705   KETONESUR NEGATIVE 07/29/2023 1705   PROTEINUR NEGATIVE 07/29/2023 1705   UROBILINOGEN 1.0 05/26/2014 2211   NITRITE NEGATIVE 07/29/2023 1705   LEUKOCYTESUR NEGATIVE 07/29/2023 1705   Sepsis Labs: @LABRCNTIP (procalcitonin:4,lacticidven:4) )No results found for this or any previous visit (from the past 240 hours).   Radiological Exams on Admission: CT ABDOMEN PELVIS W CONTRAST Result Date: 07/29/2023 CLINICAL DATA:  Abdominal pain for the past week with intermittent nausea and vomiting. Cerebral palsy. EXAM: CT ABDOMEN AND PELVIS WITH CONTRAST TECHNIQUE: Multidetector CT imaging of the abdomen and pelvis was performed using the standard protocol following bolus administration of intravenous contrast. RADIATION DOSE REDUCTION: This exam was performed according to the departmental dose-optimization program which includes automated exposure control, adjustment of the mA and/or kV according to patient size and/or use of iterative reconstruction technique. CONTRAST:  75mL OMNIPAQUE  IOHEXOL  350 MG/ML SOLN COMPARISON:  02/05/2017 FINDINGS: Lower chest: Borderline enlarged heart. Stable large hiatal hernia. Clear lung bases. Hepatobiliary: No focal liver abnormality is seen. No gallstones, gallbladder wall thickening, or biliary dilatation. Pancreas: Unremarkable. No pancreatic ductal dilatation or surrounding inflammatory  changes. Spleen: Normal in size without focal abnormality. Adrenals/Urinary Tract: Normal-appearing adrenal glands. Simple appearing right renal cysts, not requiring imaging follow-up. Unremarkable left kidney and ureters. The bladder is displaced anteriorly by a large area of fecal impaction in the rectum. Mild diffuse bladder wall thickening. Stomach/Bowel: Colonic redundancy with prominent stool throughout the colon, most pronounced in the rectum and distal sigmoid colon. These are both markedly dilated with mild surrounding wall thickening. Unremarkable small bowel. Large hiatal hernia. The appendix is not definitely identified. Vascular/Lymphatic: No significant vascular findings are present. No enlarged abdominal or pelvic lymph nodes. Reproductive: Mildly enlarged prostate gland. Other: Moderate-sized right inguinal hernia containing fat. Musculoskeletal: Moderate thoracolumbar and lower thoracic spine degenerative changes. Stable left hip dysplastic changes. IMPRESSION: 1. Prominent stool throughout the colon, most pronounced in the rectum and distal sigmoid colon. These are both markedly dilated with mild surrounding wall thickening. This is consistent with fecal  impaction and stercoral colitis. 2. Mild diffuse bladder wall thickening, likely due to chronic bladder outlet obstruction from the mildly enlarged prostate gland or chronically dilated, impacted rectum. Cystitis is also a possibility. 3. Stable large hiatal hernia. 4. Moderate-sized right inguinal hernia containing fat. Electronically Signed   By: Catherin Closs M.D.   On: 07/29/2023 18:23     Assessment/Plan Principal Problem:   Stercoral colitis Active Problems:   Type 2 diabetes mellitus with complication, without long-term current use of insulin  (HCC)   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HCC)   GERD (gastroesophageal reflux disease)   Depression with anxiety   Chronic diastolic (congestive) heart failure (HCC)   OSA (obstructive sleep  apnea)   Essential hypertension   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HCC)    Abdominal pain likely from large stool burden with fecal impaction and stercoral colitis.  Patient was refusing any fecal disimpaction in the ER initially.  Was given mag citrate following which patient had a small bowel movement.  Will order MiraLAX  and patient is agreeable for Dulcolax suppository and enema now.  I have ordered MiraLAX  since Dulcolax suppository.  May try enema if there is not much results with the above.  On ceftriaxone . Diabetes mellitus type 2 takes semaglutide.  Last hemoglobin A1c was 5.9  3 months ago.  On sliding scale coverage. Hypertension on metoprolol  Lasix  amlodipine . GERD on PPI. Hyperlipidemia on statins. Chronic HFpEF on Lasix . History of cerebral palsy.  Since patient has abdominal pain with large stool burden and fecal impaction will need further management and more than 2 midnight stay.   DVT prophylaxis: Lovenox . Code Status: Full code. Family Communication: Discussed with patient. Disposition Plan: Medical floor. Consults called: ER physician discussed with on-call gastroenterologist. Admission status: Observation.

## 2023-07-30 NOTE — ED Notes (Signed)
 Bug found on patient, took patient to [facility] room and deconed patient and changed linen. Placed belongings in red biohazard bags.

## 2023-07-30 NOTE — Progress Notes (Signed)
 PROGRESS NOTE    Thomas Ramos  WUJ:[id number] DOB: [birth year] DOA: 07/29/2023 PCP: Jearlean Mince, PA-C   Brief Narrative:  Care started prior to midnight in the emergency room and patient was admitted early this morning after midnight by Dr. Melford Spotted and I am in current agreement with his Assessment and Plan.  Additional changes to the plan of care been made accordingly.  The patient is a 53 year old chronically ill-appearing Caucasian male with a past medical history significant for Mann[place] to hypertension, hyperlipidemia, diabetes mellitus type 2, chronic heart failure with preserved ejection fraction, history of cerebral palsy and spastic quadriplegia who lives alone with the help of caregiver moves around with the help of wheelchair was recently admitted for cellulitis of lower extremity who presented to the ER this time for abdominal discomfort of last 4 to 5 days.  Had not moved his bowels in over about a week and had an episode of vomiting about 5 days ago.  In the ED CT scan of the abdomen pelvis was done and showed a very large stool burden and features concerning for stercoral colitis.  He refused fecal disimpaction or enema and was given mag citrate orally and admitted for further evaluation.  The case was discussed with GI who recommended conservative measures and continuing laxatives.  Currently he is evaluated and treated for the following but not limited to:  Assessment and Plan:  Abdominal Pain likely from large stool burden with fecal impaction and stercoral colitis. Patient was refusing any fecal disimpaction in the ER initially.  Was given mag citrate following which patient had a small bowel movement.  Will order MiraLAX  and patient is agreeable for Dulcolax suppository and enema now.  I have ordered MiraLAX  since Dulcolax suppository.  May try enema if there is not much results with the above.  On IV Ceftriaxone .  Diabetes mellitus type 2 takes semaglutide.  Last  hemoglobin A1c was 5.9  3 months ago.  On sliding scale coverage.  Essential Hypertension: On metoprolol  Lasix  amlodipine .  Hypokalemia: K+ is 3.4. Replete with po Kcl 40 mEQ BID x2. CTM and Replete as Necessary. Repeat CMP in the AM   Hyperlipidemia: C/w Rosuvastatin  5 mg po qHS  Chronic HFpEF: C/w Furosemide  20 mg po Daily.   History of Cerebral Palsy: C/w   Microcytic Anemia: Hgb/Hct is now 12.4/38.0 w/ MCV of 79.7. CTM for S/Sx of Bleeding; No overt bleeding noted. Repeat CBC in the AM  GERD/GI Prophylaxis: C/w PPI with Pantoprazole  40 mg po Daily   DVT prophylaxis: enoxaparin  (LOVENOX ) injection 40 mg Start: 07/30/23 1000    Code Status: Full Code Family Communication: No family present   Disposition Plan:  Level of care: Med-Surg Status is: Inpatient Remains inpatient appropriate because: Needs further clinical improvement.   Consultants:  ED d/w GI  Procedures:  As delineated as above  Antimicrobials:  Anti-infectives (From admission, onward)    Start     Dose/Rate Route Frequency Ordered Stop   07/30/23 0500  cefTRIAXone  (ROCEPHIN ) 2 g in sodium chloride  0.9 % 100 mL IVPB        2 g 200 mL/hr over 30 Minutes Intravenous Every 24 hours 07/30/23 0449        Objective: Vitals:   07/30/23 0552 07/30/23 0633 07/30/23 0732 07/30/23 1536  BP:  106/62 127/86 108/64  Pulse:  65 84 79  Resp:  17 16 16   Temp: 97.9 F (36.6 C) (!) 97.5 F (36.4 C) 98.4 F (36.9 C)  98.2 F (36.8 C)  TempSrc: Oral Oral Oral   SpO2:  100% 100% 98%   No intake or output data in the 24 hours ending 07/30/23 1953 There were no vitals filed for this visit.  Data Reviewed: I have personally reviewed following labs and imaging studies  CBC: Recent Labs  Lab 07/29/23 1416 07/30/23 0315  WBC 5.6 5.8  NEUTROABS 3.6 3.9  HGB 13.8 12.4*  HCT 41.7 38.0*  MCV 79.0* 79.7*  PLT 230 210   Basic Metabolic Panel: Recent Labs  Lab 07/29/23 1416 07/30/23 0315  NA 138 137  K 3.6  3.4*  CL 101 100  CO2 26 27  GLUCOSE 108* 108*  BUN 13 14  CREATININE 0.92 0.84  CALCIUM  9.3 8.9   GFR: CrCl cannot be calculated (Unknown ideal weight.). Liver Function Tests: Recent Labs  Lab 07/29/23 1416 07/30/23 0315  AST 16 12*  ALT 8 8  ALKPHOS 100 96  BILITOT 0.8 0.7  PROT 7.0 6.3*  ALBUMIN 4.0 3.5   Recent Labs  Lab 07/29/23 1416  LIPASE 32   No results for input(s): "AMMONIA" in the last 168 hours. Coagulation Profile: No results for input(s): "INR", "PROTIME" in the last 168 hours. Cardiac Enzymes: No results for input(s): "CKTOTAL", "CKMB", "CKMBINDEX", "TROPONINI" in the last 168 hours. BNP (last 3 results) No results for input(s): "PROBNP" in the last 8760 hours. HbA1C: No results for input(s): "HGBA1C" in the last 72 hours. CBG: Recent Labs  Lab 07/30/23 0759 07/30/23 1220 07/30/23 1629  GLUCAP 170* 87 135*   Lipid Profile: No results for input(s): "CHOL", "HDL", "LDLCALC", "TRIG", "CHOLHDL", "LDLDIRECT" in the last 72 hours. Thyroid Function Tests: No results for input(s): "TSH", "T4TOTAL", "FREET4", "T3FREE", "THYROIDAB" in the last 72 hours. Anemia Panel: No results for input(s): "VITAMINB12", "FOLATE", "FERRITIN", "TIBC", "IRON", "RETICCTPCT" in the last 72 hours. Sepsis Labs: No results for input(s): "PROCALCITON", "LATICACIDVEN" in the last 168 hours.  No results found for this or any previous visit (from the past 240 hours).   Radiology Studies: CT ABDOMEN PELVIS W CONTRAST Result Date: 07/29/2023 CLINICAL DATA:  Abdominal pain for the past week with intermittent nausea and vomiting. Cerebral palsy. EXAM: CT ABDOMEN AND PELVIS WITH CONTRAST TECHNIQUE: Multidetector CT imaging of the abdomen and pelvis was performed using the standard protocol following bolus administration of intravenous contrast. RADIATION DOSE REDUCTION: This exam was performed according to the departmental dose-optimization program which includes automated exposure  control, adjustment of the mA and/or kV according to patient size and/or use of iterative reconstruction technique. CONTRAST:  75mL OMNIPAQUE  IOHEXOL  350 MG/ML SOLN COMPARISON:  02/05/2017 FINDINGS: Lower chest: Borderline enlarged heart. Stable large hiatal hernia. Clear lung bases. Hepatobiliary: No focal liver abnormality is seen. No gallstones, gallbladder wall thickening, or biliary dilatation. Pancreas: Unremarkable. No pancreatic ductal dilatation or surrounding inflammatory changes. Spleen: Normal in size without focal abnormality. Adrenals/Urinary Tract: Normal-appearing adrenal glands. Simple appearing right renal cysts, not requiring imaging follow-up. Unremarkable left kidney and ureters. The bladder is displaced anteriorly by a large area of fecal impaction in the rectum. Mild diffuse bladder wall thickening. Stomach/Bowel: Colonic redundancy with prominent stool throughout the colon, most pronounced in the rectum and distal sigmoid colon. These are both markedly dilated with mild surrounding wall thickening. Unremarkable small bowel. Large hiatal hernia. The appendix is not definitely identified. Vascular/Lymphatic: No significant vascular findings are present. No enlarged abdominal or pelvic lymph nodes. Reproductive: Mildly enlarged prostate gland. Other: Moderate-sized right inguinal hernia containing fat.  Musculoskeletal: Moderate thoracolumbar and lower thoracic spine degenerative changes. Stable left hip dysplastic changes. IMPRESSION: 1. Prominent stool throughout the colon, most pronounced in the rectum and distal sigmoid colon. These are both markedly dilated with mild surrounding wall thickening. This is consistent with fecal impaction and stercoral colitis. 2. Mild diffuse bladder wall thickening, likely due to chronic bladder outlet obstruction from the mildly enlarged prostate gland or chronically dilated, impacted rectum. Cystitis is also a possibility. 3. Stable large hiatal hernia. 4.  Moderate-sized right inguinal hernia containing fat. Electronically Signed   By: Catherin Closs M.D.   On: 07/29/2023 18:23   Scheduled Meds:  amLODipine   10 mg Oral Daily   enoxaparin  (LOVENOX ) injection  40 mg Subcutaneous Q24H   furosemide   20 mg Oral Daily   insulin  aspart  0-9 Units Subcutaneous TID WC   latanoprost   1 drop Both Eyes QHS   metoprolol  succinate  25 mg Oral Daily   montelukast   10 mg Oral Daily   pantoprazole   40 mg Oral Daily   polyethylene glycol  17 g Oral BID   potassium chloride   40 mEq Oral BID   rosuvastatin   5 mg Oral QHS   senna-docusate  1 tablet Oral BID   timolol   1 drop Both Eyes BID   Continuous Infusions:  cefTRIAXone  (ROCEPHIN )  IV Stopped (07/30/23 7829)    LOS: 0 days   Aura Leeds, DO [facility] Available via Epic secure chat 7am-7pm After these hours, please refer to coverage provider listed on [email or web address] 07/30/2023, 7:53 PM

## 2023-07-31 ENCOUNTER — Inpatient Hospital Stay (HOSPITAL_COMMUNITY)

## 2023-07-31 DIAGNOSIS — G809 Cerebral palsy, unspecified: Secondary | ICD-10-CM | POA: Diagnosis not present

## 2023-07-31 DIAGNOSIS — K5289 Other specified noninfective gastroenteritis and colitis: Secondary | ICD-10-CM | POA: Diagnosis not present

## 2023-07-31 DIAGNOSIS — I5032 Chronic diastolic (congestive) heart failure: Secondary | ICD-10-CM | POA: Diagnosis not present

## 2023-07-31 DIAGNOSIS — E118 Type 2 diabetes mellitus with unspecified complications: Secondary | ICD-10-CM | POA: Diagnosis not present

## 2023-07-31 DIAGNOSIS — G4733 Obstructive sleep apnea (adult) (pediatric): Secondary | ICD-10-CM

## 2023-07-31 LAB — COMPREHENSIVE METABOLIC PANEL WITH GFR
ALT: 8 U/L (ref 0–44)
AST: 10 U/L — ABNORMAL LOW (ref 15–41)
Albumin: 3.4 g/dL — ABNORMAL LOW (ref 3.5–5.0)
Alkaline Phosphatase: 84 U/L (ref 38–126)
Anion gap: 7 (ref 5–15)
BUN: 14 mg/dL (ref 6–20)
CO2: 26 mmol/L (ref 22–32)
Calcium: 8.8 mg/dL — ABNORMAL LOW (ref 8.9–10.3)
Chloride: 101 mmol/L (ref 98–111)
Creatinine, Ser: 0.91 mg/dL (ref 0.61–1.24)
GFR, Estimated: >60 mL/min (ref >60)
Glucose, Bld: 91 mg/dL (ref 70–99)
Potassium: 4.8 mmol/L (ref 3.5–5.1)
Sodium: 134 mmol/L — ABNORMAL LOW (ref 135–145)
Total Bilirubin: 0.7 mg/dL (ref 0.0–1.2)
Total Protein: 6.1 g/dL — ABNORMAL LOW (ref 6.5–8.1)

## 2023-07-31 LAB — CBC WITH DIFFERENTIAL/PLATELET
Abs Immature Granulocytes: 0.02 10*3/uL (ref 0.00–0.07)
Basophils Absolute: 0 10*3/uL (ref 0.0–0.1)
Basophils Relative: 1 %
Eosinophils Absolute: 0.2 10*3/uL (ref 0.0–0.5)
Eosinophils Relative: 3 %
HCT: 37.5 % — ABNORMAL LOW (ref 39.0–52.0)
Hemoglobin: 12.3 g/dL — ABNORMAL LOW (ref 13.0–17.0)
Immature Granulocytes: 0 %
Lymphocytes Relative: 30 %
Lymphs Abs: 1.6 10*3/uL (ref 0.7–4.0)
MCH: 26 pg (ref 26.0–34.0)
MCHC: 32.8 g/dL (ref 30.0–36.0)
MCV: 79.3 fL — ABNORMAL LOW (ref 80.0–100.0)
Monocytes Absolute: 0.5 10*3/uL (ref 0.1–1.0)
Monocytes Relative: 9 %
Neutro Abs: 3 10*3/uL (ref 1.7–7.7)
Neutrophils Relative %: 57 %
Platelets: 183 10*3/uL (ref 150–400)
RBC: 4.73 MIL/uL (ref 4.22–5.81)
RDW: 13.1 % (ref 11.5–15.5)
WBC: 5.3 10*3/uL (ref 4.0–10.5)
nRBC: 0 % (ref 0.0–0.2)

## 2023-07-31 LAB — RETICULOCYTES
Immature Retic Fract: 16.5 % — ABNORMAL HIGH (ref 2.3–15.9)
RBC.: 4.76 MIL/uL (ref 4.22–5.81)
Retic Count, Absolute: 66.2 10*3/uL (ref 19.0–186.0)
Retic Ct Pct: 1.4 % (ref 0.4–3.1)

## 2023-07-31 LAB — FERRITIN: Ferritin: 8 ng/mL — ABNORMAL LOW (ref 24–336)

## 2023-07-31 LAB — GLUCOSE, CAPILLARY
Glucose-Capillary: 89 mg/dL (ref 70–99)
Glucose-Capillary: 119 mg/dL — ABNORMAL HIGH (ref 70–99)
Glucose-Capillary: 120 mg/dL — ABNORMAL HIGH (ref 70–99)
Glucose-Capillary: 166 mg/dL — ABNORMAL HIGH (ref 70–99)

## 2023-07-31 LAB — IRON AND TIBC
Iron: 80 ug/dL (ref 45–182)
Saturation Ratios: 21 % (ref 17.9–39.5)
TIBC: 391 ug/dL (ref 250–450)
UIBC: 311 ug/dL

## 2023-07-31 LAB — PHOSPHORUS: Phosphorus: 3 mg/dL (ref 2.5–4.6)

## 2023-07-31 LAB — MAGNESIUM: Magnesium: 2.9 mg/dL — ABNORMAL HIGH (ref 1.7–2.4)

## 2023-07-31 LAB — VITAMIN B12: Vitamin B-12: 212 pg/mL (ref 180–914)

## 2023-07-31 LAB — FOLATE: Folate: 7.4 ng/mL (ref >5.9)

## 2023-07-31 MED ORDER — BISACODYL 10 MG RE SUPP
10.0000 mg | Freq: Once | RECTAL | Status: AC
  Administered 2023-07-31: 10 mg via RECTAL
  Filled 2023-07-31: qty 1

## 2023-07-31 NOTE — Plan of Care (Signed)
  Problem: Education: Goal: Ability to describe self-care measures that may prevent or decrease complications (Diabetes Survival Skills Education) will improve Outcome: Progressing   Problem: Skin Integrity: Goal: Risk for impaired skin integrity will decrease Outcome: Progressing   Problem: Activity: Goal: Risk for activity intolerance will decrease Outcome: Progressing   Problem: Nutrition: Goal: Adequate nutrition will be maintained Outcome: Progressing   Problem: Elimination: Goal: Will not experience complications related to bowel motility Outcome: Progressing   Problem: Pain Managment: Goal: General experience of comfort will improve and/or be controlled Outcome: Progressing   Problem: Safety: Goal: Ability to remain free from injury will improve Outcome: Progressing

## 2023-07-31 NOTE — Progress Notes (Signed)
 PROGRESS NOTE    Thomas Ramos  RUE:[id number] DOB: 01-26-1971 DOA: 07/29/2023 PCP: Jearlean Mince, PA-C   Brief Narrative:  The patient is a 53 year old chronically ill-appearing Caucasian male with a past medical history significant for but not limited to HTN, HLD, DMT2, chronic heart failure with preserved ejection fraction, history of cerebral palsy and spastic quadriplegia who lives alone with the help of caregiver moves around with the help of wheelchair was recently admitted for cellulitis of lower extremity who presented to the ER this time for abdominal discomfort of last 4 to 5 days.  Had not moved his bowels in over about a week and had an episode of vomiting about 5 days ago.  In the ED CT scan of the abdomen pelvis was done and showed a very large stool burden and features concerning for stercoral colitis.  He refused fecal disimpaction or enema and was given mag citrate orally and admitted for further evaluation.  The case was discussed with GI who recommended conservative measures and continuing laxatives. He continues to have a large stool burden on repeat imaging so will continue bowel regimen.   Assessment and Plan:  Abdominal Pain likely from large stool burden with fecal impaction and stercoral colitis. Patient was refusing any fecal disimpaction in the ER initially.  Was given mag citrate following which patient had a small bowel movement. Still refusing Enema. C/w Bowel regimen w/ Senna-Docusate 1 tab po BID, Mirlax 17 gram po BID, Bisacodyl  Suppositories. KUB this AM showed "Large amount of stool seen in rectum concerning for impaction. No abnormal bowel dilatation."  On IV Ceftriaxone .  Diabetes mellitus type 2: Takes semaglutide.  Last hemoglobin A1c was 5.9  3 months ago.  C/w Sensitive Novolog  SSI AC. CTM CBGs per Protocol. CBGs ranging from 89-138  Essential Hypertension: C/w Metoprolol  Succinate 25 mg po Daily, Furosemide  20 mg po Daily, and Amlodipine  10 mg po  Daily.   Hypokalemia: K+ was 3.4 and is now 4.8. CTM and Replete as Necessary. Repeat CMP in the AM   Hyperlipidemia: C/w Rosuvastatin  5 mg po qHS  Chronic HFpEF: C/w Furosemide  20 mg po Daily. Strict I's and O's and Daily Weights. CTM for S/Sx of Volume overload  History of Cerebral Palsy: C/w Supportive Care; Has a Chronic foley  Microcytic Anemia: Hgb/Hct is now 12.3/37.5 w/ MCV of 79.3.  Panel done and showed an iron level of 80, UIBC of 311, TIBC 391, saturation ratio 21%, ferritin level 8%, folate 10.4, vitamin B12 level 212. CTM for S/Sx of Bleeding; No overt bleeding noted. Repeat CBC in the AM  GERD/GI Prophylaxis: C/w PPI with Pantoprazole  40 mg po Daily  Hypoalbuminemia: Patient's Albumin Level was trending down and went from 4.0 -> 3.5 -> 3.4. CTM and Trend and repeat CMP in the AM   DVT prophylaxis: enoxaparin  (LOVENOX ) injection 40 mg Start: 07/30/23 1000    Code Status: Full Code Family Communication: No family present @ bedside  Disposition Plan:  Level of care: Med-Surg Status is: Inpatient Remains inpatient appropriate because: Needs further bowel management given his continued large stool burden and fecal impaction   Consultants:  EDP/Admitter discussed case w/ GI  Procedures:  As delineated as above  Antimicrobials:  Anti-infectives (From admission, onward)    Start     Dose/Rate Route Frequency Ordered Stop   07/30/23 0500  cefTRIAXone  (ROCEPHIN ) 2 g in sodium chloride  0.9 % 100 mL IVPB        2 g 200 mL/hr over  30 Minutes Intravenous Every 24 hours 07/30/23 0449         Subjective: Seen and examined at bedside and was doing okay but still having some intermittent abdominal discomfort and pressure.  Having bowel movements now.  Denies any lightheadedness or dizziness.  No other concerns or complaints at this time.  Objective: Vitals:   07/30/23 1536 07/30/23 2104 07/31/23 0520 07/31/23 0745  BP: 108/64 110/77 117/76 106/71  Pulse: 79 64 70 72   Resp: 16 16 16 17   Temp: 98.2 F (36.8 C) 98.2 F (36.8 C) 97.6 F (36.4 C) 98.3 F (36.8 C)  TempSrc:  Oral Oral Oral  SpO2: 98% 100% 100% 100%    Intake/Output Summary (Last 24 hours) at 07/31/2023 1502 Last data filed at 07/31/2023 1300 Gross per 24 hour  Intake 120 ml  Output 1150 ml  Net -1030 ml   There were no vitals filed for this visit.  Examination: Physical Exam:  Constitutional: Chronically ill-appearing Caucasian male with cerebral palsy and contractures noted Respiratory: Diminished to auscultation bilaterally, no wheezing, rales, rhonchi or crackles. Normal respiratory effort and patient is not tachypenic. No accessory muscle use unlabored breathing.  Cardiovascular: RRR, no murmurs / rubs / gallops. S1 and S2 auscultated. No extremity edema.  Abdomen: Soft, a little-tender, non-distended. Bowel sounds positive.  GU: Deferred.  Catheter is in place Musculoskeletal: Has contractures and atrophy noted given his cerebral palsy Skin: No rashes, lesions, ulcers. No induration; Warm and dry.  Neurologic: CN 2-12 grossly intact with no focal deficits. Romberg sign and cerebellar reflexes not assessed.  Psychiatric: Awake and alert  Data Reviewed: I have personally reviewed following labs and imaging studies  CBC: Recent Labs  Lab 07/29/23 1416 07/30/23 0315 07/31/23 0357  WBC 5.6 5.8 5.3  NEUTROABS 3.6 3.9 3.0  HGB 13.8 12.4* 12.3*  HCT 41.7 38.0* 37.5*  MCV 79.0* 79.7* 79.3*  PLT 230 210 183   Basic Metabolic Panel: Recent Labs  Lab 07/29/23 1416 07/30/23 0315 07/31/23 0357  NA 138 137 134*  K 3.6 3.4* 4.8  CL 101 100 101  CO2 26 27 26   GLUCOSE 108* 108* 91  BUN 13 14 14   CREATININE 0.92 0.84 0.91  CALCIUM  9.3 8.9 8.8*  MG  --   --  2.9*  PHOS  --   --  3.0   GFR: CrCl cannot be calculated (Unknown ideal weight.). Liver Function Tests: Recent Labs  Lab 07/29/23 1416 07/30/23 0315 07/31/23 0357  AST 16 12* 10*  ALT 8 8 8   ALKPHOS 100  96 84  BILITOT 0.8 0.7 0.7  PROT 7.0 6.3* 6.1*  ALBUMIN 4.0 3.5 3.4*   Recent Labs  Lab 07/29/23 1416  LIPASE 32   No results for input(s): "AMMONIA" in the last 168 hours. Coagulation Profile: No results for input(s): "INR", "PROTIME" in the last 168 hours. Cardiac Enzymes: No results for input(s): "CKTOTAL", "CKMB", "CKMBINDEX", "TROPONINI" in the last 168 hours. BNP (last 3 results) No results for input(s): "PROBNP" in the last 8760 hours. HbA1C: No results for input(s): "HGBA1C" in the last 72 hours. CBG: Recent Labs  Lab 07/30/23 1220 07/30/23 1629 07/30/23 2107 07/31/23 0740 07/31/23 1200  GLUCAP 87 135* 138* 89 119*   Lipid Profile: No results for input(s): "CHOL", "HDL", "LDLCALC", "TRIG", "CHOLHDL", "LDLDIRECT" in the last 72 hours. Thyroid Function Tests: No results for input(s): "TSH", "T4TOTAL", "FREET4", "T3FREE", "THYROIDAB" in the last 72 hours. Anemia Panel: Recent Labs    07/31/23 0357  VITAMINB12 212  FOLATE 7.4  FERRITIN 8*  TIBC 391  IRON 80  RETICCTPCT 1.4   Sepsis Labs: No results for input(s): "PROCALCITON", "LATICACIDVEN" in the last 168 hours.  No results found for this or any previous visit (from the past 240 hours).   Radiology Studies: DG Abd Portable 1V Result Date: 07/31/2023 CLINICAL DATA:  Constipation. EXAM: PORTABLE ABDOMEN - 1 VIEW COMPARISON:  May 27, 2014. FINDINGS: No abnormal bowel dilatation. Large amount of stool seen in the rectum concerning for impaction. No radio-opaque calculi or other significant radiographic abnormality are seen. IMPRESSION: Large amount of stool seen in rectum concerning for impaction. No abnormal bowel dilatation. Electronically Signed   By: Rosalene Colon M.D.   On: 07/31/2023 10:40   CT ABDOMEN PELVIS W CONTRAST Result Date: 07/29/2023 CLINICAL DATA:  Abdominal pain for the past week with intermittent nausea and vomiting. Cerebral palsy. EXAM: CT ABDOMEN AND PELVIS WITH CONTRAST TECHNIQUE:  Multidetector CT imaging of the abdomen and pelvis was performed using the standard protocol following bolus administration of intravenous contrast. RADIATION DOSE REDUCTION: This exam was performed according to the departmental dose-optimization program which includes automated exposure control, adjustment of the mA and/or kV according to patient size and/or use of iterative reconstruction technique. CONTRAST:  75mL OMNIPAQUE  IOHEXOL  350 MG/ML SOLN COMPARISON:  02/05/2017 FINDINGS: Lower chest: Borderline enlarged heart. Stable large hiatal hernia. Clear lung bases. Hepatobiliary: No focal liver abnormality is seen. No gallstones, gallbladder wall thickening, or biliary dilatation. Pancreas: Unremarkable. No pancreatic ductal dilatation or surrounding inflammatory changes. Spleen: Normal in size without focal abnormality. Adrenals/Urinary Tract: Normal-appearing adrenal glands. Simple appearing right renal cysts, not requiring imaging follow-up. Unremarkable left kidney and ureters. The bladder is displaced anteriorly by a large area of fecal impaction in the rectum. Mild diffuse bladder wall thickening. Stomach/Bowel: Colonic redundancy with prominent stool throughout the colon, most pronounced in the rectum and distal sigmoid colon. These are both markedly dilated with mild surrounding wall thickening. Unremarkable small bowel. Large hiatal hernia. The appendix is not definitely identified. Vascular/Lymphatic: No significant vascular findings are present. No enlarged abdominal or pelvic lymph nodes. Reproductive: Mildly enlarged prostate gland. Other: Moderate-sized right inguinal hernia containing fat. Musculoskeletal: Moderate thoracolumbar and lower thoracic spine degenerative changes. Stable left hip dysplastic changes. IMPRESSION: 1. Prominent stool throughout the colon, most pronounced in the rectum and distal sigmoid colon. These are both markedly dilated with mild surrounding wall thickening. This is  consistent with fecal impaction and stercoral colitis. 2. Mild diffuse bladder wall thickening, likely due to chronic bladder outlet obstruction from the mildly enlarged prostate gland or chronically dilated, impacted rectum. Cystitis is also a possibility. 3. Stable large hiatal hernia. 4. Moderate-sized right inguinal hernia containing fat. Electronically Signed   By: Catherin Closs M.D.   On: 07/29/2023 18:23   Scheduled Meds:  amLODipine   10 mg Oral Daily   enoxaparin  (LOVENOX ) injection  40 mg Subcutaneous Q24H   furosemide   20 mg Oral Daily   insulin  aspart  0-9 Units Subcutaneous TID WC   latanoprost   1 drop Both Eyes QHS   metoprolol  succinate  25 mg Oral Daily   montelukast   10 mg Oral Daily   pantoprazole   40 mg Oral Daily   polyethylene glycol  17 g Oral BID   rosuvastatin   5 mg Oral QHS   senna-docusate  1 tablet Oral BID   timolol   1 drop Both Eyes BID   Continuous Infusions:  cefTRIAXone  (ROCEPHIN )  IV  2 g (07/31/23 0528)    LOS: 1 day   Aura Leeds, DO [facility] Available via Epic secure chat 7am-7pm After these hours, please refer to coverage provider listed on [email or web address] 07/31/2023, 3:02 PM

## 2023-07-31 NOTE — Progress Notes (Signed)
 PT Cancellation Note  Patient Details Name: Thomas Ramos MRN: [id number] DOB: [birth year]   Cancelled Treatment:    Reason Eval/Treat Not Completed: PT screened, no needs identified, will sign off -  Pt is dependent at baseline with 24/7 care and plans to return home when medically able. No PT needs, will sign off.   Dorothy Polhemus S, PT DPT Acute Rehabilitation Services Secure Chat Preferred  Office [phone]    Ethridge Herder 07/31/2023, 12:40 PM

## 2023-07-31 NOTE — Progress Notes (Signed)
 OT Cancellation Note  Patient Details Name: Thomas Ramos MRN: [id number] DOB: [birth year]   Cancelled Treatment:    Reason Eval/Treat Not Completed: OT screened, no needs identified, will sign off Pt is dependent at baseline with 24/7 care and plans to return home when medically able. No OT needs, will sign off. Thank you.    Sarabi Sockwell C, OT  Acute Rehabilitation Services Office [phone] Secure chat preferred   Mickael Alamo 07/31/2023, 11:51 AM

## 2023-08-01 ENCOUNTER — Inpatient Hospital Stay (HOSPITAL_COMMUNITY)

## 2023-08-01 DIAGNOSIS — E118 Type 2 diabetes mellitus with unspecified complications: Secondary | ICD-10-CM | POA: Diagnosis not present

## 2023-08-01 DIAGNOSIS — G809 Cerebral palsy, unspecified: Secondary | ICD-10-CM | POA: Diagnosis not present

## 2023-08-01 DIAGNOSIS — I5032 Chronic diastolic (congestive) heart failure: Secondary | ICD-10-CM | POA: Diagnosis not present

## 2023-08-01 DIAGNOSIS — K5289 Other specified noninfective gastroenteritis and colitis: Secondary | ICD-10-CM | POA: Diagnosis not present

## 2023-08-01 LAB — CBC WITH DIFFERENTIAL/PLATELET
Abs Immature Granulocytes: 0.03 10*3/uL (ref 0.00–0.07)
Basophils Absolute: 0 10*3/uL (ref 0.0–0.1)
Basophils Relative: 1 %
Eosinophils Absolute: 0.2 10*3/uL (ref 0.0–0.5)
Eosinophils Relative: 3 %
HCT: 38.2 % — ABNORMAL LOW (ref 39.0–52.0)
Hemoglobin: 12.7 g/dL — ABNORMAL LOW (ref 13.0–17.0)
Immature Granulocytes: 1 %
Lymphocytes Relative: 22 %
Lymphs Abs: 1.1 10*3/uL (ref 0.7–4.0)
MCH: 25.8 pg — ABNORMAL LOW (ref 26.0–34.0)
MCHC: 33.2 g/dL (ref 30.0–36.0)
MCV: 77.6 fL — ABNORMAL LOW (ref 80.0–100.0)
Monocytes Absolute: 0.3 10*3/uL (ref 0.1–1.0)
Monocytes Relative: 6 %
Neutro Abs: 3.6 10*3/uL (ref 1.7–7.7)
Neutrophils Relative %: 67 %
Platelets: 175 10*3/uL (ref 150–400)
RBC: 4.92 MIL/uL (ref 4.22–5.81)
RDW: 13.2 % (ref 11.5–15.5)
WBC: 5.3 10*3/uL (ref 4.0–10.5)
nRBC: 0 % (ref 0.0–0.2)

## 2023-08-01 LAB — COMPREHENSIVE METABOLIC PANEL WITH GFR
ALT: 9 U/L (ref 0–44)
AST: 13 U/L — ABNORMAL LOW (ref 15–41)
Albumin: 3.6 g/dL (ref 3.5–5.0)
Alkaline Phosphatase: 88 U/L (ref 38–126)
Anion gap: 9 (ref 5–15)
BUN: 19 mg/dL (ref 6–20)
CO2: 25 mmol/L (ref 22–32)
Calcium: 9.1 mg/dL (ref 8.9–10.3)
Chloride: 97 mmol/L — ABNORMAL LOW (ref 98–111)
Creatinine, Ser: 1.02 mg/dL (ref 0.61–1.24)
GFR, Estimated: >60 mL/min (ref >60)
Glucose, Bld: 176 mg/dL — ABNORMAL HIGH (ref 70–99)
Potassium: 4.1 mmol/L (ref 3.5–5.1)
Sodium: 131 mmol/L — ABNORMAL LOW (ref 135–145)
Total Bilirubin: 0.5 mg/dL (ref 0.0–1.2)
Total Protein: 6.5 g/dL (ref 6.5–8.1)

## 2023-08-01 LAB — PHOSPHORUS: Phosphorus: 3.3 mg/dL (ref 2.5–4.6)

## 2023-08-01 LAB — GLUCOSE, CAPILLARY
Glucose-Capillary: 88 mg/dL (ref 70–99)
Glucose-Capillary: 95 mg/dL (ref 70–99)
Glucose-Capillary: 159 mg/dL — ABNORMAL HIGH (ref 70–99)
Glucose-Capillary: 116 mg/dL — ABNORMAL HIGH (ref 70–99)

## 2023-08-01 LAB — MAGNESIUM: Magnesium: 2.4 mg/dL (ref 1.7–2.4)

## 2023-08-01 MED ORDER — BISACODYL 10 MG RE SUPP
10.0000 mg | Freq: Once | RECTAL | Status: AC
  Administered 2023-08-01: 10 mg via RECTAL
  Filled 2023-08-01: qty 1

## 2023-08-01 MED ORDER — MAGNESIUM CITRATE PO SOLN
1.0000 | Freq: Once | ORAL | Status: AC
  Administered 2023-08-01: 1 via ORAL
  Filled 2023-08-01: qty 296

## 2023-08-01 NOTE — Plan of Care (Signed)
  Problem: Fluid Volume: Goal: Ability to maintain a balanced intake and output will improve Outcome: Progressing   Problem: Health Behavior/Discharge Planning: Goal: Ability to manage health-related needs will improve Outcome: Progressing   Problem: Nutritional: Goal: Maintenance of adequate nutrition will improve Outcome: Progressing   Problem: Safety: Goal: Ability to remain free from injury will improve Outcome: Progressing

## 2023-08-01 NOTE — Plan of Care (Signed)
   Problem: Coping: Goal: Ability to adjust to condition or change in health will improve Outcome: Progressing   Problem: Nutritional: Goal: Maintenance of adequate nutrition will improve Outcome: Progressing   Problem: Skin Integrity: Goal: Risk for impaired skin integrity will decrease Outcome: Progressing

## 2023-08-01 NOTE — Progress Notes (Signed)
 PROGRESS NOTE    Thomas Ramos  BMW:[id number] DOB: Apr 20, 1970 DOA: 07/29/2023 PCP: Jearlean Mince, PA-C   Brief Narrative:  The patient is a 53 year old chronically ill-appearing Caucasian male with a past medical history significant for but not limited to HTN, HLD, DMT2, chronic heart failure with preserved ejection fraction, history of cerebral palsy and spastic quadriplegia who lives alone with the help of caregiver moves around with the help of wheelchair was recently admitted for cellulitis of lower extremity who presented to the ER this time for abdominal discomfort of last 4 to 5 days.  Had not moved his bowels in over about a week and had an episode of vomiting about 5 days ago.  In the ED CT scan of the abdomen pelvis was done and showed a very large stool burden and features concerning for stercoral colitis.  He refused fecal disimpaction or enema and was given mag citrate orally and admitted for further evaluation.  The case was discussed with GI who recommended conservative measures and continuing laxatives. He continued to have a large stool burden on repeat imaging so will continue bowel regimen and have ordered another bottle of Mag Citrate.   Assessment and Plan:  Abdominal Pain likely from large stool burden with fecal impaction and stercoral colitis. Improving. Patient was refusing any fecal disimpaction in the ER initially.  Was given mag citrate following which patient had a small bowel movement. Still refusing Enema. C/w Bowel regimen w/ Senna-Docusate 1 tab po BID, Mirlax 17 gram po BID, Bisacodyl  Suppositories and add another bottle of Mag Citrate. KUB yesterday showed AM showed Large amount of stool seen in rectum concerning for impaction and no abnormal dilation and today it showed Diffuse gaseous distention of small bowel and colon with Rectal stool volume appearing to be decreased in the interval. On IV Ceftriaxone    Diabetes mellitus type 2: Takes semaglutide.  Last  hemoglobin A1c was 5.9  3 months ago.  C/w Sensitive Novolog  SSI AC. CTM CBGs per Protocol. CBGs ranging from 88-166  Essential Hypertension: C/w Metoprolol  Succinate 25 mg po Daily, Furosemide  20 mg po Daily, and Amlodipine  10 mg po Daily. CTM BP per Protocol. Last BP reading was 104/74  Hypokalemia: Improved. K+ is now 4.1. CTM and Replete as Necessary. Repeat CMP in the AM   HypoNa+ : Na+ went from 137 -> 134 -> 131. CTM and Trend and repeat CMP in the AM  Hyperlipidemia: C/w Rosuvastatin  5 mg po qHS  Chronic HFpEF: C/w Furosemide  20 mg po Daily. Strict I's and O's and Daily Weights. CTM for S/Sx of Volume overload; He is -740 mL since admission   History of Cerebral Palsy: C/w Supportive Care; Has a Chronic foley  Microcytic Anemia: Hgb/Hct is now 12.7/38.2 w/ MCV of 77.6.  Panel done and showed an iron level of 80, UIBC of 311, TIBC 391, saturation ratio 21%, ferritin level 8%, folate 10.4, vitamin B12 level 212. CTM for S/Sx of Bleeding; No overt bleeding noted. Repeat CBC in the AM  GERD/GI Prophylaxis: C/w PPI with Pantoprazole  40 mg po Daily  Hypoalbuminemia: Patient's Albumin Level was trending down but now went from 4.0 -> 3.5 -> 3.4 -> 3.6. CTM and Trend and repeat CMP in the AM   DVT prophylaxis: enoxaparin  (LOVENOX ) injection 40 mg Start: 07/30/23 1000    Code Status: Full Code Family Communication: No family present @ bedside   Disposition Plan:  Level of care: Med-Surg Status is: Inpatient Remains inpatient appropriate because: Needs  further clinical improvement and continued Bowel Movements   Consultants:  EDP/Admitter discussed case w/ GI   Procedures:  As delineated as above  Antimicrobials:  Anti-infectives (From admission, onward)    Start     Dose/Rate Route Frequency Ordered Stop   07/30/23 0500  cefTRIAXone  (ROCEPHIN ) 2 g in sodium chloride  0.9 % 100 mL IVPB        2 g 200 mL/hr over 30 Minutes Intravenous Every 24 hours 07/30/23 0449          Subjective: Seen and examined at bedside and he still having some mild pain and states that he is having bowel movements and had to so far.  Denies lightheadedness or dizziness.  No other concerns or complaints this time.  Objective: Vitals:   07/31/23 1621 07/31/23 1956 08/01/23 0533 08/01/23 0740  BP: 96/67 96/68 91/65  104/74  Pulse: 79 72 61 72  Resp: 17 18 18 17   Temp: 97.7 F (36.5 C) 98.7 F (37.1 C) (!) 97.5 F (36.4 C) 97.8 F (36.6 C)  TempSrc: Oral  Oral Oral  SpO2: 100% 99% 100% 100%    Intake/Output Summary (Last 24 hours) at 08/01/2023 1433 Last data filed at 08/01/2023 1300 Gross per 24 hour  Intake 1000 ml  Output 500 ml  Net 500 ml   There were no vitals filed for this visit.  Examination: Physical Exam:  Constitutional: Chronically ill-appearing Caucasian male with cerebral palsy contractures noted Respiratory: Diminished to auscultation bilaterally, no wheezing, rales, rhonchi or crackles. Normal respiratory effort and patient is not tachypenic. No accessory muscle use.  Unlabored breathing Cardiovascular: RRR, no murmurs / rubs / gallops. S1 and S2 auscultated. No extremity edema.  Abdomen: Soft, a little-tender, non-distended. Bowel sounds positive.  GU: Deferred.  Catheter is in place Musculoskeletal: Has contractures and atrophy noted in the setting of his cerebral palsy Skin: No rashes, lesions, ulcers on limited skin evaluation. No induration; Warm and dry.  Neurologic: CN 2-12 grossly intact with no focal deficits but he has cerebral palsy and spastic quadriplegia. Romberg sign and cerebellar reflexes not assessed.  Psychiatric: He is awake and alert  Data Reviewed: I have personally reviewed following labs and imaging studies  CBC: Recent Labs  Lab 07/29/23 1416 07/30/23 0315 07/31/23 0357 08/01/23 0907  WBC 5.6 5.8 5.3 5.3  NEUTROABS 3.6 3.9 3.0 3.6  HGB 13.8 12.4* 12.3* 12.7*  HCT 41.7 38.0* 37.5* 38.2*  MCV 79.0* 79.7* 79.3* 77.6*   PLT 230 210 183 175   Basic Metabolic Panel: Recent Labs  Lab 07/29/23 1416 07/30/23 0315 07/31/23 0357 08/01/23 0907  NA 138 137 134* 131*  K 3.6 3.4* 4.8 4.1  CL 101 100 101 97*  CO2 26 27 26 25   GLUCOSE 108* 108* 91 176*  BUN 13 14 14 19   CREATININE 0.92 0.84 0.91 1.02  CALCIUM  9.3 8.9 8.8* 9.1  MG  --   --  2.9* 2.4  PHOS  --   --  3.0 3.3   GFR: CrCl cannot be calculated (Unknown ideal weight.). Liver Function Tests: Recent Labs  Lab 07/29/23 1416 07/30/23 0315 07/31/23 0357 08/01/23 0907  AST 16 12* 10* 13*  ALT 8 8 8 9   ALKPHOS 100 96 84 88  BILITOT 0.8 0.7 0.7 0.5  PROT 7.0 6.3* 6.1* 6.5  ALBUMIN 4.0 3.5 3.4* 3.6   Recent Labs  Lab 07/29/23 1416  LIPASE 32   No results for input(s): "AMMONIA" in the last 168 hours. Coagulation Profile:  No results for input(s): "INR", "PROTIME" in the last 168 hours. Cardiac Enzymes: No results for input(s): "CKTOTAL", "CKMB", "CKMBINDEX", "TROPONINI" in the last 168 hours. BNP (last 3 results) No results for input(s): "PROBNP" in the last 8760 hours. HbA1C: No results for input(s): "HGBA1C" in the last 72 hours. CBG: Recent Labs  Lab 07/31/23 1200 07/31/23 1707 07/31/23 1959 08/01/23 0737 08/01/23 1204  GLUCAP 119* 120* 166* 88 95   Lipid Profile: No results for input(s): "CHOL", "HDL", "LDLCALC", "TRIG", "CHOLHDL", "LDLDIRECT" in the last 72 hours. Thyroid Function Tests: No results for input(s): "TSH", "T4TOTAL", "FREET4", "T3FREE", "THYROIDAB" in the last 72 hours. Anemia Panel: Recent Labs    07/31/23 0357  VITAMINB12 212  FOLATE 7.4  FERRITIN 8*  TIBC 391  IRON 80  RETICCTPCT 1.4   Sepsis Labs: No results for input(s): "PROCALCITON", "LATICACIDVEN" in the last 168 hours.  No results found for this or any previous visit (from the past 240 hours).   Radiology Studies: DG Abd 1 View Result Date: 08/01/2023 CLINICAL DATA:  Constipation. EXAM: ABDOMEN - 1 VIEW COMPARISON:  07/31/2023  FINDINGS: Diffuse gaseous distention of small bowel and colon is similar to yesterday's study. Rectal stool volume appears decreased in the interval. Bones are diffusely demineralized with chronic deformity in the left hip. IMPRESSION: Persistent diffuse gaseous distention of small bowel and colon. Rectal stool volume appears decreased in the interval. Electronically Signed   By: Donnal Fusi M.D.   On: 08/01/2023 09:37   DG Abd Portable 1V Result Date: 07/31/2023 CLINICAL DATA:  Constipation. EXAM: PORTABLE ABDOMEN - 1 VIEW COMPARISON:  May 27, 2014. FINDINGS: No abnormal bowel dilatation. Large amount of stool seen in the rectum concerning for impaction. No radio-opaque calculi or other significant radiographic abnormality are seen. IMPRESSION: Large amount of stool seen in rectum concerning for impaction. No abnormal bowel dilatation. Electronically Signed   By: Rosalene Colon M.D.   On: 07/31/2023 10:40   Scheduled Meds:  amLODipine   10 mg Oral Daily   enoxaparin  (LOVENOX ) injection  40 mg Subcutaneous Q24H   furosemide   20 mg Oral Daily   insulin  aspart  0-9 Units Subcutaneous TID WC   latanoprost   1 drop Both Eyes QHS   metoprolol  succinate  25 mg Oral Daily   montelukast   10 mg Oral Daily   pantoprazole   40 mg Oral Daily   polyethylene glycol  17 g Oral BID   rosuvastatin   5 mg Oral QHS   senna-docusate  1 tablet Oral BID   timolol   1 drop Both Eyes BID   Continuous Infusions:  cefTRIAXone  (ROCEPHIN )  IV 2 g (08/01/23 0506)    LOS: 2 days   Aura Leeds, DO [facility] Available via Epic secure chat 7am-7pm After these hours, please refer to coverage provider listed on [email or web address] 08/01/2023, 2:33 PM

## 2023-08-02 ENCOUNTER — Inpatient Hospital Stay (HOSPITAL_COMMUNITY)

## 2023-08-02 ENCOUNTER — Other Ambulatory Visit (HOSPITAL_COMMUNITY): Payer: Self-pay

## 2023-08-02 DIAGNOSIS — G809 Cerebral palsy, unspecified: Secondary | ICD-10-CM | POA: Diagnosis not present

## 2023-08-02 DIAGNOSIS — E118 Type 2 diabetes mellitus with unspecified complications: Secondary | ICD-10-CM | POA: Diagnosis not present

## 2023-08-02 DIAGNOSIS — K5289 Other specified noninfective gastroenteritis and colitis: Secondary | ICD-10-CM | POA: Diagnosis not present

## 2023-08-02 DIAGNOSIS — I5032 Chronic diastolic (congestive) heart failure: Secondary | ICD-10-CM | POA: Diagnosis not present

## 2023-08-02 LAB — CBC WITH DIFFERENTIAL/PLATELET
Abs Immature Granulocytes: 0.03 10*3/uL (ref 0.00–0.07)
Basophils Absolute: 0 10*3/uL (ref 0.0–0.1)
Basophils Relative: 1 %
Eosinophils Absolute: 0.1 10*3/uL (ref 0.0–0.5)
Eosinophils Relative: 3 %
HCT: 41.1 % (ref 39.0–52.0)
Hemoglobin: 13.5 g/dL (ref 13.0–17.0)
Immature Granulocytes: 1 %
Lymphocytes Relative: 25 %
Lymphs Abs: 1.2 10*3/uL (ref 0.7–4.0)
MCH: 26.1 pg (ref 26.0–34.0)
MCHC: 32.8 g/dL (ref 30.0–36.0)
MCV: 79.5 fL — ABNORMAL LOW (ref 80.0–100.0)
Monocytes Absolute: 0.5 10*3/uL (ref 0.1–1.0)
Monocytes Relative: 10 %
Neutro Abs: 3.1 10*3/uL (ref 1.7–7.7)
Neutrophils Relative %: 60 %
Platelets: 190 10*3/uL (ref 150–400)
RBC: 5.17 MIL/uL (ref 4.22–5.81)
RDW: 13.1 % (ref 11.5–15.5)
WBC: 5 10*3/uL (ref 4.0–10.5)
nRBC: 0 % (ref 0.0–0.2)

## 2023-08-02 LAB — GLUCOSE, CAPILLARY
Glucose-Capillary: 104 mg/dL — ABNORMAL HIGH (ref 70–99)
Glucose-Capillary: 127 mg/dL — ABNORMAL HIGH (ref 70–99)

## 2023-08-02 LAB — COMPREHENSIVE METABOLIC PANEL WITH GFR
ALT: 8 U/L (ref 0–44)
AST: 13 U/L — ABNORMAL LOW (ref 15–41)
Albumin: 3.6 g/dL (ref 3.5–5.0)
Alkaline Phosphatase: 89 U/L (ref 38–126)
Anion gap: 11 (ref 5–15)
BUN: 19 mg/dL (ref 6–20)
CO2: 28 mmol/L (ref 22–32)
Calcium: 9.3 mg/dL (ref 8.9–10.3)
Chloride: 95 mmol/L — ABNORMAL LOW (ref 98–111)
Creatinine, Ser: 1 mg/dL (ref 0.61–1.24)
GFR, Estimated: >60 mL/min (ref >60)
Glucose, Bld: 96 mg/dL (ref 70–99)
Potassium: 4.5 mmol/L (ref 3.5–5.1)
Sodium: 134 mmol/L — ABNORMAL LOW (ref 135–145)
Total Bilirubin: 0.4 mg/dL (ref 0.0–1.2)
Total Protein: 6.8 g/dL (ref 6.5–8.1)

## 2023-08-02 LAB — MAGNESIUM: Magnesium: 3.2 mg/dL — ABNORMAL HIGH (ref 1.7–2.4)

## 2023-08-02 LAB — PHOSPHORUS: Phosphorus: 3.2 mg/dL (ref 2.5–4.6)

## 2023-08-02 MED ORDER — METRONIDAZOLE 500 MG PO TABS
500.0000 mg | ORAL_TABLET | Freq: Two times a day (BID) | ORAL | 0 refills | Status: AC
  Filled 2023-08-02: qty 10, 5d supply, fill #0

## 2023-08-02 MED ORDER — ONDANSETRON 4 MG PO TBDP
4.0000 mg | ORAL_TABLET | Freq: Three times a day (TID) | ORAL | 0 refills | Status: AC | PRN
  Filled 2023-08-02: qty 20, 7d supply, fill #0

## 2023-08-02 MED ORDER — SIMETHICONE 80 MG PO CHEW
80.0000 mg | CHEWABLE_TABLET | Freq: Four times a day (QID) | ORAL | 0 refills | Status: AC | PRN
  Filled 2023-08-02: qty 30, 8d supply, fill #0

## 2023-08-02 MED ORDER — SENNOSIDES-DOCUSATE SODIUM 8.6-50 MG PO TABS
1.0000 | ORAL_TABLET | Freq: Every day | ORAL | 0 refills | Status: DC
  Filled 2023-08-02: qty 30, 30d supply, fill #0

## 2023-08-02 MED ORDER — CIPROFLOXACIN HCL 500 MG PO TABS: 500.0000 mg | ORAL_TABLET | Freq: Two times a day (BID) | ORAL | Status: DC

## 2023-08-02 MED ORDER — POLYETHYLENE GLYCOL 3350 17 GM/SCOOP PO POWD
17.0000 g | Freq: Every day | ORAL | 0 refills | Status: DC
  Filled 2023-08-02: qty 238, 14d supply, fill #0

## 2023-08-02 MED ORDER — CIPROFLOXACIN HCL 500 MG PO TABS
500.0000 mg | ORAL_TABLET | Freq: Two times a day (BID) | ORAL | 0 refills | Status: AC
  Filled 2023-08-02: qty 10, 5d supply, fill #0

## 2023-08-02 MED ORDER — METRONIDAZOLE 500 MG PO TABS: 500.0000 mg | ORAL_TABLET | Freq: Two times a day (BID) | ORAL | Status: DC

## 2023-08-02 MED ORDER — SIMETHICONE 80 MG PO CHEW: 80.0000 mg | CHEWABLE_TABLET | Freq: Four times a day (QID) | ORAL | Status: DC | PRN

## 2023-08-02 NOTE — Care Management Important Message (Signed)
 Important Message  Patient Details  Name: Thomas Ramos MRN: [id number] Date of Birth: 08-Mar-1971   Important Message Given:  Yes - Medicare IM     Felix Host 08/02/2023, 2:47 PM

## 2023-08-02 NOTE — Discharge Summary (Signed)
 Physician Discharge Summary   Patient: Thomas Ramos MRN: [id number] DOB: 1971-01-04  Admit date:     07/29/2023  Discharge date: 08/02/23  Discharge Physician: Aura Leeds, DO   PCP: Jearlean Mince, PA-C   Recommendations at discharge:   Follow up with PCP w/in 1-2 weeks and repeat CBC, CMP, Mag, Phos w/in 1 week C/w Bowel regimen for discharge  Discharge Diagnoses: Principal Problem:   Stercoral colitis Active Problems:   Type 2 diabetes mellitus with complication, without long-term current use of insulin  (HCC)   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HCC)   GERD (gastroesophageal reflux disease)   Depression with anxiety   Chronic diastolic (congestive) heart failure (HCC)   OSA (obstructive sleep apnea)   Essential hypertension   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HCC)  Resolved Problems:   * No resolved hospital problems. [facility] Course: The patient is a 53 year old chronically ill-appearing Caucasian male with a past medical history significant for but not limited to HTN, HLD, DMT2, chronic heart failure with preserved ejection fraction, history of cerebral palsy and spastic quadriplegia who lives alone with the help of caregiver moves around with the help of wheelchair was recently admitted for cellulitis of lower extremity who presented to the ER this time for abdominal discomfort of last 4 to 5 days.  Had not moved his bowels in over about a week and had an episode of vomiting about 5 days ago.  In the ED CT scan of the abdomen pelvis was done and showed a very large stool burden and features concerning for stercoral colitis.  He refused fecal disimpaction or enema and was given mag citrate orally and admitted for further evaluation.  The case was discussed with GI who recommended conservative measures and continuing laxatives. He continued to have a large stool burden on repeat imaging so will continue bowel regimen and have ordered another bottle of Mag Citrate.   Assessment and  Plan:  Abdominal Pain likely from large stool burden with fecal impaction and stercoral colitis. Improving. Patient was refusing any fecal disimpaction in the ER initially.  Was given mag citrate following which patient had a small bowel movement. Still refusing Enema. C/w Bowel regimen w/ Senna-Docusate 1 tab po BID, Mirlax 17 gram po BID, Bisacodyl  Suppositories and add another bottle of Mag Citrate. KUB yesterday showed AM showed Large amount of stool seen in rectum concerning for impaction and no abnormal dilation and today it showed Diffuse gaseous distention of small bowel and colon with Rectal stool volume appearing to be decreased in the interval. On IV Ceftriaxone  and will be changed to oral Cipro  Flagyl ..  He is improved and having multiple bowel movements and repeat KUB done and showed mild colonic stool burden and no abnormal dilatation.  Diabetes mellitus type 2: Takes semaglutide.  Last hemoglobin A1c was 5.9  3 months ago.  C/w Sensitive Novolog  SSI AC. CTM CBGs per Protocol. CBGs ranging from 104-127 on last 3 checks  Essential Hypertension: C/w Metoprolol  Succinate 25 mg po Daily, Furosemide  20 mg po Daily, and Amlodipine  10 mg po Daily. CTM BP per Protocol. Last BP reading was 104/74  Hypokalemia: Improved. K+ is now 4.1. CTM and Replete as Necessary. Repeat CMP in the AM   HypoNa+ : Na+ went from 137 -> 134 -> 131 and is now 134. CTM and Trend and repeat CMP in the AM  Hyperlipidemia: C/w Rosuvastatin  5 mg po qHS  Chronic HFpEF: C/w Furosemide  20 mg po Daily. Strict I's and O's and  Daily Weights. CTM for S/Sx of Volume overload; He is -410 mL since admission   History of Cerebral Palsy: C/w Supportive Care; Has a Chronic foley change in outpatient  Microcytic Anemia: Hgb/Hct is now 13.5/41.1 w/ MCV of 79.5.  Anemia Panel done and showed an iron level of 80, UIBC of 311, TIBC 391, saturation ratio 21%, ferritin level 8%, folate 10.4, vitamin B12 level 212. CTM for S/Sx of  Bleeding; No overt bleeding noted. Repeat CBC in the AM  GERD/GI Prophylaxis: C/w PPI with Pantoprazole  40 mg po Daily  Hypoalbuminemia: Patient's Albumin Level was trending down but now went from 4.0 -> 3.5 -> 3.4 -> 3.6 -> 3.2. CTM and Trend and repeat CMP in the AM  Consultants: None  Procedures performed: As delineated as above   Disposition: Home Diet recommendation:  Discharge Diet Orders (From admission, onward)     Start     Ordered   08/02/23 0000  Diet - low sodium heart healthy        08/02/23 1409   08/02/23 0000  Diet Carb Modified        08/02/23 1409           Cardiac and Carb modified diet DISCHARGE MEDICATION: Allergies as of 08/02/2023       Reactions   Penicillins Hives, Nausea And Vomiting, Other (See Comments)   Patient tolerated cefazolin  in 2017   Sulfamethoxazole -trimethoprim  Nausea And Vomiting   Metronidazole  Nausea And Vomiting        Medication List     PAUSE taking these medications    Semaglutide(0.25 or 0.5MG /DOS) 2 MG/3ML Sopn Wait to take this until: Aug 08, 2023 Inject 0.25 mg into the skin once a week.       STOP taking these medications    aspirin  81 MG chewable tablet       TAKE these medications    acetaminophen  325 MG tablet Commonly known as: TYLENOL  Take 2 tablets (650 mg total) by mouth every 6 (six) hours as needed for mild pain (pain score 1-3), moderate pain (pain score 4-6) or fever (or Fever >/= 101).   amLODipine  10 MG tablet Commonly known as: NORVASC  Take 10 mg by mouth daily.   baclofen  10 MG tablet Commonly known as: LIORESAL  Take 2.5 mg by mouth at bedtime.   ciprofloxacin  500 MG tablet Commonly known as: CIPRO  Take 1 tablet (500 mg total) by mouth 2 (two) times daily for 5 days.   furosemide  20 MG tablet Commonly known as: LASIX  Take 20 mg by mouth daily.   latanoprost  0.005 % ophthalmic solution Commonly known as: XALATAN  Place 1 drop into both eyes at bedtime.   metoprolol   succinate 25 MG 24 hr tablet Commonly known as: TOPROL -XL Take 0.5 tablets (12.5 mg total) by mouth daily. What changed: how much to take   metroNIDAZOLE  500 MG tablet Commonly known as: FLAGYL  Take 1 tablet (500 mg total) by mouth every 12 (twelve) hours for 5 days.   montelukast  10 MG tablet Commonly known as: SINGULAIR  Take 1 tablet by mouth daily.   nitroGLYCERIN  0.4 MG SL tablet Commonly known as: NITROSTAT  Place 0.4 mg under the tongue every 5 (five) minutes as needed for chest pain.   omeprazole  20 MG capsule Commonly known as: PRILOSEC Take 1 capsule by mouth every morning.   ondansetron  4 MG disintegrating tablet Commonly known as: ZOFRAN -ODT Take 1 tablet (4 mg total) by mouth every 8 (eight) hours as needed for nausea or vomiting.  polyethylene glycol powder 17 GM/SCOOP powder Commonly known as: GLYCOLAX /MIRALAX  Take 17 g by mouth daily.   ProAir  RespiClick 108 (90 Base) MCG/ACT Aepb Generic drug: Albuterol  Sulfate Inhale 2 puffs into the lungs every 6 (six) hours as needed (Shortness of breath).   rosuvastatin  5 MG tablet Commonly known as: CRESTOR  Take 5 mg by mouth at bedtime.   Senna-S 8.6-50 MG tablet Generic drug: senna-docusate Take 1 tablet by mouth at bedtime.   simethicone  80 MG chewable tablet Commonly known as: MYLICON Chew 1 tablet (80 mg total) by mouth 4 (four) times daily as needed for flatulence.   timolol  0.5 % ophthalmic solution Commonly known as: TIMOPTIC  Place 1 drop into both eyes 2 (two) times daily.        Discharge Exam: There were no vitals filed for this visit. Vitals:   08/02/23 1103 08/02/23 1542  BP: 118/76 (!) 141/88  Pulse: 85 97  Resp:  16  Temp:  97.9 F (36.6 C)  SpO2:  100%   Examination: Physical Exam:  Constitutional: Chronically ill-appearing Caucasian male with cerebral palsy contractures noted. Respiratory: Slightly diminished to auscultation bilaterally, no wheezing, rales, rhonchi or crackles.  Normal respiratory effort and patient is not tachypenic. No accessory muscle use.  Unlabored breathing Cardiovascular: RRR, no murmurs / rubs / gallops. S1 and S2 auscultated. No extremity edema.  Abdomen: Soft, non-tender, mildly-distended. Bowel sounds positive.  GU: Deferred. Musculoskeletal: Has atrophied extremities Skin: No rashes, lesions, ulcers on limited skin evaluation Neurologic: As follows and spastic quadriplegia with contractures noted and atrophy of his extremities Psychiatric: He is awake and alert and oriented  Condition at discharge: stable  The results of significant diagnostics from this hospitalization (including imaging, microbiology, ancillary and laboratory) are listed below for reference.   Imaging Studies: DG Abd 1 View Result Date: 08/02/2023 CLINICAL DATA:  Constipation. EXAM: ABDOMEN - 1 VIEW COMPARISON:  Abdominal radiograph dated 08/01/2023. FINDINGS: Mild colonic stool burden. No bowel dilatation. Air is noted in the colon. Osteopenia with degenerative changes of the spine. Chronic bilateral hip deformity. No acute osseous pathology. IMPRESSION: Mild colonic stool burden. No bowel dilatation. Electronically Signed   By: Angus Bark M.D.   On: 08/02/2023 09:52   DG Abd 1 View Result Date: 08/01/2023 CLINICAL DATA:  Constipation. EXAM: ABDOMEN - 1 VIEW COMPARISON:  07/31/2023 FINDINGS: Diffuse gaseous distention of small bowel and colon is similar to yesterday's study. Rectal stool volume appears decreased in the interval. Bones are diffusely demineralized with chronic deformity in the left hip. IMPRESSION: Persistent diffuse gaseous distention of small bowel and colon. Rectal stool volume appears decreased in the interval. Electronically Signed   By: Donnal Fusi M.D.   On: 08/01/2023 09:37   DG Abd Portable 1V Result Date: 07/31/2023 CLINICAL DATA:  Constipation. EXAM: PORTABLE ABDOMEN - 1 VIEW COMPARISON:  May 27, 2014. FINDINGS: No abnormal bowel  dilatation. Large amount of stool seen in the rectum concerning for impaction. No radio-opaque calculi or other significant radiographic abnormality are seen. IMPRESSION: Large amount of stool seen in rectum concerning for impaction. No abnormal bowel dilatation. Electronically Signed   By: Rosalene Colon M.D.   On: 07/31/2023 10:40   CT ABDOMEN PELVIS W CONTRAST Result Date: 07/29/2023 CLINICAL DATA:  Abdominal pain for the past week with intermittent nausea and vomiting. Cerebral palsy. EXAM: CT ABDOMEN AND PELVIS WITH CONTRAST TECHNIQUE: Multidetector CT imaging of the abdomen and pelvis was performed using the standard protocol following bolus administration of intravenous  contrast. RADIATION DOSE REDUCTION: This exam was performed according to the departmental dose-optimization program which includes automated exposure control, adjustment of the mA and/or kV according to patient size and/or use of iterative reconstruction technique. CONTRAST:  75mL OMNIPAQUE  IOHEXOL  350 MG/ML SOLN COMPARISON:  02/05/2017 FINDINGS: Lower chest: Borderline enlarged heart. Stable large hiatal hernia. Clear lung bases. Hepatobiliary: No focal liver abnormality is seen. No gallstones, gallbladder wall thickening, or biliary dilatation. Pancreas: Unremarkable. No pancreatic ductal dilatation or surrounding inflammatory changes. Spleen: Normal in size without focal abnormality. Adrenals/Urinary Tract: Normal-appearing adrenal glands. Simple appearing right renal cysts, not requiring imaging follow-up. Unremarkable left kidney and ureters. The bladder is displaced anteriorly by a large area of fecal impaction in the rectum. Mild diffuse bladder wall thickening. Stomach/Bowel: Colonic redundancy with prominent stool throughout the colon, most pronounced in the rectum and distal sigmoid colon. These are both markedly dilated with mild surrounding wall thickening. Unremarkable small bowel. Large hiatal hernia. The appendix is not  definitely identified. Vascular/Lymphatic: No significant vascular findings are present. No enlarged abdominal or pelvic lymph nodes. Reproductive: Mildly enlarged prostate gland. Other: Moderate-sized right inguinal hernia containing fat. Musculoskeletal: Moderate thoracolumbar and lower thoracic spine degenerative changes. Stable left hip dysplastic changes. IMPRESSION: 1. Prominent stool throughout the colon, most pronounced in the rectum and distal sigmoid colon. These are both markedly dilated with mild surrounding wall thickening. This is consistent with fecal impaction and stercoral colitis. 2. Mild diffuse bladder wall thickening, likely due to chronic bladder outlet obstruction from the mildly enlarged prostate gland or chronically dilated, impacted rectum. Cystitis is also a possibility. 3. Stable large hiatal hernia. 4. Moderate-sized right inguinal hernia containing fat. Electronically Signed   By: Catherin Closs M.D.   On: 07/29/2023 18:23    Microbiology: Results for orders placed or performed during the hospital encounter of 04/25/23  Blood culture (routine x 2)     Status: None   Collection Time: 04/25/23 12:43 AM   Specimen: BLOOD  Result Value Ref Range Status   Specimen Description BLOOD LEFT ANTECUBITAL  Final   Special Requests   Final    BOTTLES DRAWN AEROBIC AND ANAEROBIC Blood Culture results may not be optimal due to an inadequate volume of blood received in culture bottles   Culture   Final    NO GROWTH 5 DAYS Performed at [facility], [address], [place], [place] [address]    Report Status 04/30/2023 FINAL  Final  Blood culture (routine x 2)     Status: None   Collection Time: 04/25/23  1:36 AM   Specimen: BLOOD  Result Value Ref Range Status   Specimen Description BLOOD BLOOD LEFT HAND  Final   Special Requests   Final    BOTTLES DRAWN AEROBIC AND ANAEROBIC Blood Culture results may not be optimal due to an inadequate volume of blood received in culture  bottles   Culture   Final    NO GROWTH 5 DAYS Performed at [facility], [address] [place], [place] [address]    Report Status 04/30/2023 FINAL  Final  MRSA Next Gen by PCR, Nasal     Status: None   Collection Time: 04/25/23 12:43 PM   Specimen: Nasal Swab  Result Value Ref Range Status   MRSA by PCR Next Gen NOT DETECTED NOT DETECTED Final    Comment: (NOTE) The GeneXpert MRSA Assay (FDA approved for NASAL specimens only), is one component of a comprehensive MRSA colonization surveillance program.  It is not intended to diagnose MRSA infection nor to guide or monitor treatment for MRSA infections. Test performance is not FDA approved in patients less than 51 years old. Performed at [facility], [address] [place], [place] [address]    Labs: CBC: Recent Labs  Lab 07/29/23 1416 07/30/23 0315 07/31/23 0357 08/01/23 0907 08/02/23 0733  WBC 5.6 5.8 5.3 5.3 5.0  NEUTROABS 3.6 3.9 3.0 3.6 3.1  HGB 13.8 12.4* 12.3* 12.7* 13.5  HCT 41.7 38.0* 37.5* 38.2* 41.1  MCV 79.0* 79.7* 79.3* 77.6* 79.5*  PLT 230 210 183 175 190   Basic Metabolic Panel: Recent Labs  Lab 07/29/23 1416 07/30/23 0315 07/31/23 0357 08/01/23 0907 08/02/23 0733  NA 138 137 134* 131* 134*  K 3.6 3.4* 4.8 4.1 4.5  CL 101 100 101 97* 95*  CO2 26 27 26 25 28   GLUCOSE 108* 108* 91 176* 96  BUN 13 14 14 19 19   CREATININE 0.92 0.84 0.91 1.02 1.00  CALCIUM  9.3 8.9 8.8* 9.1 9.3  MG  --   --  2.9* 2.4 3.2*  PHOS  --   --  3.0 3.3 3.2   Liver Function Tests: Recent Labs  Lab 07/29/23 1416 07/30/23 0315 07/31/23 0357 08/01/23 0907 08/02/23 0733  AST 16 12* 10* 13* 13*  ALT 8 8 8 9 8   ALKPHOS 100 96 84 88 89  BILITOT 0.8 0.7 0.7 0.5 0.4  PROT 7.0 6.3* 6.1* 6.5 6.8  ALBUMIN 4.0 3.5 3.4* 3.6 3.6   CBG: Recent Labs  Lab 08/01/23 1204 08/01/23 1713 08/01/23 2103 08/02/23 0808 08/02/23 1207  GLUCAP 95 159* 116* 104* 127*   Discharge time spent: greater than 30  minutes.  Signed: Aura Leeds, DO [facility] 08/03/2023

## 2023-08-02 NOTE — TOC Progression Note (Addendum)
 Transition of Care [facility]) - Progression Note    Patient Details  Name: Thomas Ramos MRN: [id number] Date of Birth: [birth year]  [facility] CM/SW Contact  Charolet Cope Arturo Late, RN Phone Number: 08/02/2023, 2:50 PM  Clinical Narrative:     Patient being discharged. Will need PTAR. Confirmed  address.   Will need to confirm caregiver aware and will be present when he arrives. Left message awaiting call back.   PTAR paperwork on chart   [name] called back . [name] has a key to get in and staff will be there. Confirmed with patient he has a key .   PTAR called   Expected Discharge Plan: Home/Self Care (has caregiver) Barriers to Discharge: Continued Medical Work up  Expected Discharge Plan and Services   Discharge Planning Services: CM Consult Post Acute Care Choice: NA Living arrangements for the past 2 months: Apartment Expected Discharge Date: 08/02/23               DME Arranged: N/A DME Agency: NA       HH Arranged: NA HH Agency: NA         Social Determinants of Health (SDOH) Interventions SDOH Screenings   Food Insecurity: No Food Insecurity (07/30/2023)  Housing: Low Risk  (07/30/2023)  Transportation Needs: No Transportation Needs (07/30/2023)  Utilities: Not At Risk (07/30/2023)  Financial Resource Strain: Low Risk  (04/20/2023)   Received from [facility]  Physical Activity: Unknown (03/31/2022)   Received from [facility], [facility]  Social Connections: Moderately Integrated (03/31/2022)   [facility]  Stress: No Stress Concern Present (03/31/2022)   [facility]  Tobacco Use: Medium Risk (07/30/2023)    Readmission Risk Interventions    07/15/2022    2:11 PM  Readmission Risk Prevention Plan  Post Dischage Appt Complete  Medication Screening Complete  Transportation Screening Complete

## 2023-08-02 NOTE — Progress Notes (Signed)
 Pt has no legal guardian

## 2023-08-03 ENCOUNTER — Observation Stay (HOSPITAL_COMMUNITY)
Admission: EM | Admit: 2023-08-03 | Discharge: 2023-08-05 | Disposition: A | Discharge status: Home / Self Care | Attending: Internal Medicine | Admitting: Internal Medicine

## 2023-08-03 ENCOUNTER — Encounter (HOSPITAL_COMMUNITY): Payer: Self-pay | Admitting: Emergency Medicine

## 2023-08-03 DIAGNOSIS — E119 Type 2 diabetes mellitus without complications: Secondary | ICD-10-CM | POA: Insufficient documentation

## 2023-08-03 DIAGNOSIS — I5032 Chronic diastolic (congestive) heart failure: Secondary | ICD-10-CM | POA: Diagnosis present

## 2023-08-03 DIAGNOSIS — E118 Type 2 diabetes mellitus with unspecified complications: Secondary | ICD-10-CM | POA: Diagnosis present

## 2023-08-03 DIAGNOSIS — Z79899 Other long term (current) drug therapy: Secondary | ICD-10-CM | POA: Insufficient documentation

## 2023-08-03 DIAGNOSIS — I10 Essential (primary) hypertension: Secondary | ICD-10-CM | POA: Diagnosis present

## 2023-08-03 DIAGNOSIS — G8250 Quadriplegia, unspecified: Secondary | ICD-10-CM | POA: Insufficient documentation

## 2023-08-03 DIAGNOSIS — K5904 Chronic idiopathic constipation: Secondary | ICD-10-CM | POA: Diagnosis not present

## 2023-08-03 DIAGNOSIS — G809 Cerebral palsy, unspecified: Secondary | ICD-10-CM | POA: Diagnosis present

## 2023-08-03 DIAGNOSIS — Q809 Congenital ichthyosis, unspecified: Secondary | ICD-10-CM | POA: Diagnosis not present

## 2023-08-03 DIAGNOSIS — J45909 Unspecified asthma, uncomplicated: Secondary | ICD-10-CM | POA: Insufficient documentation

## 2023-08-03 DIAGNOSIS — G4733 Obstructive sleep apnea (adult) (pediatric): Secondary | ICD-10-CM | POA: Insufficient documentation

## 2023-08-03 DIAGNOSIS — R1110 Vomiting, unspecified: Secondary | ICD-10-CM

## 2023-08-03 DIAGNOSIS — I110 Hypertensive heart disease with heart failure: Secondary | ICD-10-CM | POA: Insufficient documentation

## 2023-08-03 DIAGNOSIS — R112 Nausea with vomiting, unspecified: Principal | ICD-10-CM | POA: Diagnosis present

## 2023-08-03 MED ORDER — ONDANSETRON 8 MG PO TBDP
8.0000 mg | ORAL_TABLET | Freq: Once | ORAL | Status: AC
  Administered 2023-08-03: 8 mg via ORAL
  Filled 2023-08-03: qty 1

## 2023-08-03 NOTE — ED Triage Notes (Signed)
 Pt arriving via EMS from home with complaint of N/V/D x1 week. Recently seen at [facility] for same, no improvement in symptoms.

## 2023-08-03 NOTE — ED Provider Notes (Signed)
 [place] EMERGENCY DEPARTMENT AT [facility] Provider Note   CSN: [id number] Arrival date & time: 08/03/23  2216     History  Chief Complaint  Patient presents with   Nausea   Emesis   Diarrhea    Thomas Ramos is a 53 y.o. male.  The history is provided by the patient.  Patient w/complex history including cerebral palsy, CHF, diabetes presents with vomiting.  Patient was just released from the hospital yesterday.  He reports today has had 2 episodes of nonbloody emesis.  He reports 1 episode of diarrhea that is nonbloody.  He has no abdominal pain.  No fevers, no cough, no chest pain or reported. Patient reports he decided come back to the hospital so he can figure out what is causing his vomiting. He would like to see his gastroenterologist   Past Medical History:  Diagnosis Date   Anxiety    Asthma    Cellulitis 08/04/2017   Cerebral palsy (HCC)    Chronic diastolic (congestive) heart failure (HCC)    CP (cerebral palsy) (HCC)    Diabetes mellitus without complication (HCC)    borderline   Drug induced constipation    Environmental allergies    takes inhalers if needed   Esophageal stricture    GERD (gastroesophageal reflux disease)    Hypertension    Intractable nausea and vomiting 08/04/2023   Motility disorder, esophageal    Pneumonia    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HCC)    S/P Botox  injection    approx every 4 months   Seasonal allergies     Home Medications Prior to Admission medications   Medication Sig Start Date End Date Taking? Authorizing Provider  acetaminophen  (TYLENOL ) 325 MG tablet Take 2 tablets (650 mg total) by mouth every 6 (six) hours as needed for mild pain (pain score 1-3), moderate pain (pain score 4-6) or fever (or Fever >/= 101). 04/23/23   Hongalgi, Anand D, MD  Albuterol  Sulfate (PROAIR  RESPICLICK) 108 (90 Base) MCG/ACT AEPB Inhale 2 puffs into the lungs every 6 (six) hours as needed (Shortness of breath). 05/03/20    [provider]  amLODipine  (NORVASC ) 10 MG tablet Take 10 mg by mouth daily. 07/19/23   [provider]  baclofen  (LIORESAL ) 10 MG tablet Take 2.5 mg by mouth at bedtime. 06/01/18   [provider]  ciprofloxacin  (CIPRO ) 500 MG tablet Take 1 tablet (500 mg total) by mouth 2 (two) times daily for 5 days. 08/02/23 08/07/23  Aura Leeds Latif, DO  furosemide  (LASIX ) 20 MG tablet Take 20 mg by mouth daily. 10/23/20   [provider]  latanoprost  (XALATAN ) 0.005 % ophthalmic solution Place 1 drop into both eyes at bedtime. 08/25/21   [provider]  metoprolol  succinate (TOPROL -XL) 25 MG 24 hr tablet Take 0.5 tablets (12.5 mg total) by mouth daily. Patient taking differently: Take 25 mg by mouth daily. 07/01/23   Meng, Hao, PA  metroNIDAZOLE  (FLAGYL ) 500 MG tablet Take 1 tablet (500 mg total) by mouth every 12 (twelve) hours for 5 days. 08/02/23 08/07/23  Aura Leeds Latif, DO  montelukast  (SINGULAIR ) 10 MG tablet Take 1 tablet by mouth daily. 07/28/21   [provider]  nitroGLYCERIN  (NITROSTAT ) 0.4 MG SL tablet Place 0.4 mg under the tongue every 5 (five) minutes as needed for chest pain.    [provider]  omeprazole  (PRILOSEC) 20 MG capsule Take 1 capsule by mouth every morning. 12/12/19   [provider]  ondansetron  (  ZOFRAN -ODT) 4 MG disintegrating tablet Take 1 tablet (4 mg total) by mouth every 8 (eight) hours as needed for nausea or vomiting. 08/02/23   Sheikh, Omair Latif, DO  polyethylene glycol powder (GLYCOLAX /MIRALAX ) 17 GM/SCOOP powder Take 17 g by mouth daily. 08/02/23   Sheikh, Jonel Nephew Latif, DO  rosuvastatin  (CRESTOR ) 5 MG tablet Take 5 mg by mouth at bedtime.    [provider]  Semaglutide,0.25 or 0.5MG /DOS, 2 MG/3ML SOPN Inject 0.25 mg into the skin once a week. 06/15/23   [provider]  senna-docusate (SENOKOT-S) 8.6-50 MG tablet Take 1 tablet by mouth at bedtime. 08/02/23 09/01/23  Aura Leeds Latif, DO   simethicone  (MYLICON) 80 MG chewable tablet Chew 1 tablet (80 mg total) by mouth 4 (four) times daily as needed for flatulence. 08/02/23   Aura Leeds Latif, DO  timolol  (TIMOPTIC ) 0.5 % ophthalmic solution Place 1 drop into both eyes 2 (two) times daily. 04/10/20   [provider]      Allergies    Penicillins, Sulfamethoxazole -trimethoprim , and Metronidazole     Review of Systems   Review of Systems  Constitutional:  Negative for fever.  Gastrointestinal:  Positive for nausea and vomiting. Negative for abdominal pain.    Physical Exam Updated Vital Signs BP 101/88 (BP Location: Left Arm)   Pulse 82   Temp 97.6 F (36.4 C) (Oral)   Resp 16   SpO2 98%  Physical Exam CONSTITUTIONAL: Chronically ill-appearing, no acute distress HEAD: Normocephalic/atraumatic EYES: EOMI/PERRL, no icterus ENMT: Mucous membranes moist NECK: supple no meningeal signs CV: S1/S2 noted, no murmurs/rubs/gallops noted LUNGS: Lungs are clear to auscultation bilaterally, no apparent distress ABDOMEN: soft, protuberant but nondistended, no tenderness, no rebound or guarding, bowel sounds noted throughout abdomen GU: Wearing a diaper, no large inguinal hernia, no scrotal tenderness or edema, chaperone present Patient declined rectal exam.  There is no overlying erythema to the sacrum or buttocks.  Brown stool noted in the diaper, no blood or melena NEURO: Pt is awake/alert/appropriate EXTREMITIES: Chronic contractures noted, no deformities SKIN: warm, color normal  ED Results / Procedures / Treatments   Labs (all labs ordered are listed, but only abnormal results are displayed) Labs Reviewed  COMPREHENSIVE METABOLIC PANEL WITH GFR - Abnormal; Notable for the following components:      Result Value   Sodium 133 (*)    Glucose, Bld 157 (*)    BUN 23 (*)    All other components within normal limits  CBC WITH DIFFERENTIAL/PLATELET  LIPASE, BLOOD    EKG EKG  Interpretation Date/Time:  Wednesday Aug 04 2023 00:06:20 EDT Ventricular Rate:  84 PR Interval:  164 QRS Duration:  93 QT Interval:  366 QTC Calculation: 433 R Axis:   105  Text Interpretation: Sinus rhythm Right axis deviation Borderline T wave abnormalities Interpretation limited secondary to artifact Confirmed by Eldon Greenland ([id number]) on 08/04/2023 12:23:39 AM  Radiology DG Abd 1 View Result Date: 08/02/2023 CLINICAL DATA:  Constipation. EXAM: ABDOMEN - 1 VIEW COMPARISON:  Abdominal radiograph dated 08/01/2023. FINDINGS: Mild colonic stool burden. No bowel dilatation. Air is noted in the colon. Osteopenia with degenerative changes of the spine. Chronic bilateral hip deformity. No acute osseous pathology. IMPRESSION: Mild colonic stool burden. No bowel dilatation. Electronically Signed   By: Angus Bark M.D.   On: 08/02/2023 09:52    Procedures Procedures    Medications Ordered in ED Medications  ondansetron  (ZOFRAN -ODT) disintegrating tablet 8 mg (8 mg Oral Given 08/03/23 2354)  sodium chloride  0.9 %  bolus 1,000 mL (1,000 mLs Intravenous New Bag/Given 08/04/23 0219)  ondansetron  (ZOFRAN ) injection 4 mg (4 mg Intravenous Given 08/04/23 2440)    ED Course/ Medical Decision Making/ A&P Clinical Course as of 08/04/23 0257  Tue Aug 03, 2023  2348 Patient was just released in the hospital for stercoral colitis.  Prior to discharge, he was having normal bowel movements and was started on oral Cipro /Flagyl . Since leaving he has had 2 episodes of vomiting but none in the ED He denies abdominal pain and no focal tenderness He would like to avoid IV if possible. Will check labs, try oral antiemetics and reassess Patient reports he lives at home with a roommate and he is his own guardian [DW]  Wed Aug 04, 2023  0256 Overall labs are reassuring, but once patient trialed oral challenge he started to vomit.  It was nonbloody emesis.  He has no abdominal pain.  Due to his underlying  debility with recent admission, patient is a poor candidate for outpatient management. Will plan for readmission and monitoring.  IV fluids and Zofran  have been ordered.  Will defer imaging for now as he continues to deny abdominal pain [DW]  0257 Discussed with Dr. Brice Campi for admission [DW]    Clinical Course User Index [DW] Eldon Greenland, MD                                 Medical Decision Making Amount and/or Complexity of Data Reviewed Labs: ordered. ECG/medicine tests: ordered.  Risk Prescription drug management. Decision regarding hospitalization.   This patient presents to the ED for concern of vomiting, this involves an extensive number of treatment options, and is a complaint that carries with it a high risk of complications and morbidity.  The differential diagnosis includes but is not limited to cholecystitis, cholelithiasis, pancreatitis, gastritis, peptic ulcer disease, appendicitis, bowel obstruction, bowel perforation, diverticulitis, AAA, ischemic bowel, colitis, acute coronary syndrome   Comorbidities that complicate the patient evaluation: Patient's presentation is complicated by their history of cerebral palsy  Social Determinants of Health: Patient's poor mobility  increases the complexity of managing their presentation  Additional history obtained: Records reviewed previous admission documents  Lab Tests: I Ordered, and personally interpreted labs.  The pertinent results include:  mild hyperglycemia   Medicines ordered and prescription drug management: I ordered medication including Zofran  for nausea Reevaluation of the patient after these medicines showed that the patient    stayed the same  Critical Interventions:   admission for intractable vomiting  Consultations Obtained: I requested consultation with the admitting physician [facility], and discussed  findings as well as pertinent plan - they recommend: Evaluate for admission  Reevaluation: After  the interventions noted above, I reevaluated the patient and found that they have :stayed the same  Complexity of problems addressed: Patient's presentation is most consistent with  acute presentation with potential threat to life or bodily function  Disposition: After consideration of the diagnostic results and the patient's response to treatment,  I feel that the patent would benefit from admission  .           Final Clinical Impression(s) / ED Diagnoses Final diagnoses:  Intractable vomiting    Rx / DC Orders ED Discharge Orders     None         Eldon Greenland, MD 08/04/23 [phone]

## 2023-08-04 ENCOUNTER — Encounter (HOSPITAL_COMMUNITY): Payer: Self-pay | Admitting: Family Medicine

## 2023-08-04 ENCOUNTER — Other Ambulatory Visit: Payer: Self-pay

## 2023-08-04 ENCOUNTER — Observation Stay (HOSPITAL_COMMUNITY)

## 2023-08-04 DIAGNOSIS — I5032 Chronic diastolic (congestive) heart failure: Secondary | ICD-10-CM

## 2023-08-04 DIAGNOSIS — E118 Type 2 diabetes mellitus with unspecified complications: Secondary | ICD-10-CM | POA: Diagnosis not present

## 2023-08-04 DIAGNOSIS — I10 Essential (primary) hypertension: Secondary | ICD-10-CM

## 2023-08-04 DIAGNOSIS — G809 Cerebral palsy, unspecified: Secondary | ICD-10-CM | POA: Diagnosis not present

## 2023-08-04 DIAGNOSIS — R112 Nausea with vomiting, unspecified: Secondary | ICD-10-CM | POA: Diagnosis not present

## 2023-08-04 HISTORY — DX: Nausea with vomiting, unspecified: R11.2

## 2023-08-04 LAB — COMPREHENSIVE METABOLIC PANEL WITH GFR
ALT: 10 U/L (ref 0–44)
AST: 15 U/L (ref 15–41)
Albumin: 4 g/dL (ref 3.5–5.0)
Alkaline Phosphatase: 99 U/L (ref 38–126)
Anion gap: 11 (ref 5–15)
BUN: 23 mg/dL — ABNORMAL HIGH (ref 6–20)
CO2: 24 mmol/L (ref 22–32)
Calcium: 9.7 mg/dL (ref 8.9–10.3)
Chloride: 98 mmol/L (ref 98–111)
Creatinine, Ser: 0.68 mg/dL (ref 0.61–1.24)
GFR, Estimated: >60 mL/min (ref >60)
Glucose, Bld: 157 mg/dL — ABNORMAL HIGH (ref 70–99)
Potassium: 4.4 mmol/L (ref 3.5–5.1)
Sodium: 133 mmol/L — ABNORMAL LOW (ref 135–145)
Total Bilirubin: 0.6 mg/dL (ref 0.0–1.2)
Total Protein: 7.2 g/dL (ref 6.5–8.1)

## 2023-08-04 LAB — CBC WITH DIFFERENTIAL/PLATELET
Abs Immature Granulocytes: 0.06 10*3/uL (ref 0.00–0.07)
Basophils Absolute: 0 10*3/uL (ref 0.0–0.1)
Basophils Relative: 0 %
Eosinophils Absolute: 0 10*3/uL (ref 0.0–0.5)
Eosinophils Relative: 0 %
HCT: 41.7 % (ref 39.0–52.0)
Hemoglobin: 13.7 g/dL (ref 13.0–17.0)
Immature Granulocytes: 1 %
Lymphocytes Relative: 11 %
Lymphs Abs: 1 10*3/uL (ref 0.7–4.0)
MCH: 26.5 pg (ref 26.0–34.0)
MCHC: 32.9 g/dL (ref 30.0–36.0)
MCV: 80.7 fL (ref 80.0–100.0)
Monocytes Absolute: 0.6 10*3/uL (ref 0.1–1.0)
Monocytes Relative: 6 %
Neutro Abs: 7.3 10*3/uL (ref 1.7–7.7)
Neutrophils Relative %: 82 %
Platelets: 218 10*3/uL (ref 150–400)
RBC: 5.17 MIL/uL (ref 4.22–5.81)
RDW: 13.2 % (ref 11.5–15.5)
WBC: 8.9 10*3/uL (ref 4.0–10.5)
nRBC: 0 % (ref 0.0–0.2)

## 2023-08-04 LAB — LIPASE, BLOOD: Lipase: 30 U/L (ref 11–51)

## 2023-08-04 LAB — GLUCOSE, CAPILLARY
Glucose-Capillary: 130 mg/dL — ABNORMAL HIGH (ref 70–99)
Glucose-Capillary: 108 mg/dL — ABNORMAL HIGH (ref 70–99)
Glucose-Capillary: 107 mg/dL — ABNORMAL HIGH (ref 70–99)
Glucose-Capillary: 146 mg/dL — ABNORMAL HIGH (ref 70–99)
Glucose-Capillary: 188 mg/dL — ABNORMAL HIGH (ref 70–99)

## 2023-08-04 MED ORDER — POLYETHYLENE GLYCOL 3350 17 G PO PACK: 17.0000 g | PACK | Freq: Every day | ORAL | Status: DC | PRN

## 2023-08-04 MED ORDER — ONDANSETRON HCL 4 MG/2ML IJ SOLN: 4.0000 mg | Freq: Four times a day (QID) | INTRAMUSCULAR | Status: DC | PRN

## 2023-08-04 MED ORDER — ONDANSETRON HCL 4 MG PO TABS: 4.0000 mg | ORAL_TABLET | Freq: Four times a day (QID) | ORAL | Status: DC | PRN

## 2023-08-04 MED ORDER — TIMOLOL MALEATE 0.5 % OP SOLN
1.0000 [drp] | Freq: Two times a day (BID) | OPHTHALMIC | Status: DC
  Administered 2023-08-04 – 2023-08-05 (×3): 1 [drp] via OPHTHALMIC
  Filled 2023-08-04: qty 5

## 2023-08-04 MED ORDER — INSULIN ASPART 100 UNIT/ML IJ SOLN: 0.0000 [IU] | INTRAMUSCULAR | Status: DC

## 2023-08-04 MED ORDER — MONTELUKAST SODIUM 10 MG PO TABS
10.0000 mg | ORAL_TABLET | Freq: Every day | ORAL | Status: DC
  Administered 2023-08-04 – 2023-08-05 (×2): 10 mg via ORAL
  Filled 2023-08-04 (×2): qty 1

## 2023-08-04 MED ORDER — FUROSEMIDE 20 MG PO TABS: 20.0000 mg | ORAL_TABLET | Freq: Every day | ORAL | Status: AC | PRN

## 2023-08-04 MED ORDER — SODIUM CHLORIDE 0.9 % IV BOLUS (SEPSIS)
1000.0000 mL | Freq: Once | INTRAVENOUS | Status: AC
Start: 2023-08-04 — End: 2023-08-04
  Administered 2023-08-04: 1000 mL via INTRAVENOUS

## 2023-08-04 MED ORDER — ENOXAPARIN SODIUM 40 MG/0.4ML IJ SOSY
40.0000 mg | PREFILLED_SYRINGE | INTRAMUSCULAR | Status: DC
  Administered 2023-08-04 – 2023-08-05 (×2): 40 mg via SUBCUTANEOUS
  Filled 2023-08-04 (×2): qty 0.4

## 2023-08-04 MED ORDER — ACETAMINOPHEN 650 MG RE SUPP
650.0000 mg | Freq: Four times a day (QID) | RECTAL | Status: DC | PRN
Start: 2023-08-04 — End: 2023-08-05

## 2023-08-04 MED ORDER — LATANOPROST 0.005 % OP SOLN
1.0000 [drp] | Freq: Every day | OPHTHALMIC | Status: DC
  Administered 2023-08-04: 1 [drp] via OPHTHALMIC
  Filled 2023-08-04: qty 2.5

## 2023-08-04 MED ORDER — INSULIN ASPART 100 UNIT/ML IJ SOLN: 0.0000 [IU] | Freq: Three times a day (TID) | INTRAMUSCULAR | Status: DC

## 2023-08-04 MED ORDER — LACTATED RINGERS IV SOLN: INTRAVENOUS | Status: AC

## 2023-08-04 MED ORDER — ONDANSETRON HCL 4 MG/2ML IJ SOLN
4.0000 mg | Freq: Once | INTRAMUSCULAR | Status: AC
  Administered 2023-08-04: 4 mg via INTRAVENOUS
  Filled 2023-08-04: qty 2

## 2023-08-04 MED ORDER — BISACODYL 5 MG PO TBEC: 5.0000 mg | DELAYED_RELEASE_TABLET | Freq: Every day | ORAL | Status: DC | PRN

## 2023-08-04 MED ORDER — ALBUTEROL SULFATE 108 (90 BASE) MCG/ACT IN AEPB: 2.0000 | INHALATION_SPRAY | Freq: Four times a day (QID) | RESPIRATORY_TRACT | Status: DC | PRN

## 2023-08-04 MED ORDER — AMLODIPINE BESYLATE 10 MG PO TABS
10.0000 mg | ORAL_TABLET | Freq: Every day | ORAL | Status: DC
  Filled 2023-08-04: qty 1

## 2023-08-04 MED ORDER — ALBUTEROL SULFATE (2.5 MG/3ML) 0.083% IN NEBU: 2.5000 mg | INHALATION_SOLUTION | Freq: Four times a day (QID) | RESPIRATORY_TRACT | Status: DC | PRN

## 2023-08-04 MED ORDER — BACLOFEN 10 MG PO TABS
5.0000 mg | ORAL_TABLET | Freq: Every day | ORAL | Status: DC
  Administered 2023-08-04: 5 mg via ORAL
  Filled 2023-08-04: qty 1

## 2023-08-04 MED ORDER — SODIUM CHLORIDE 0.9 % IV SOLN: 12.5000 mg | Freq: Four times a day (QID) | INTRAVENOUS | Status: DC | PRN

## 2023-08-04 MED ORDER — ACETAMINOPHEN 325 MG PO TABS: 650.0000 mg | ORAL_TABLET | Freq: Four times a day (QID) | ORAL | Status: DC | PRN

## 2023-08-04 MED ORDER — ROSUVASTATIN CALCIUM 5 MG PO TABS
5.0000 mg | ORAL_TABLET | Freq: Every day | ORAL | Status: DC
Start: 2023-08-04 — End: 2023-08-05
  Administered 2023-08-04: 5 mg via ORAL
  Filled 2023-08-04: qty 1

## 2023-08-04 MED ORDER — METOPROLOL SUCCINATE ER 25 MG PO TB24
12.5000 mg | ORAL_TABLET | Freq: Every day | ORAL | Status: DC
  Filled 2023-08-04: qty 1

## 2023-08-04 NOTE — ED Notes (Signed)
 Pt given ginger ale and crackers. Pt currently vomiting. MD aware.

## 2023-08-04 NOTE — Assessment & Plan Note (Addendum)
 08-04-2023 chronic. Pt is AxOx4. Uses power wheelchair. Lives [place] to caretaker Thomas Ramos   08-05-2023 stable. He will need transport to home via ambulance. He does not have his power wheelchair with him in the hospital.

## 2023-08-04 NOTE — H&P (Signed)
 History and Physical    NATHANEL TALLMAN ONG:[id number] DOB: 1970-06-15 DOA: 08/03/2023  PCP: Jearlean Mince, PA-C   Patient coming from: Home   Chief Complaint: N/V   HPI: Thomas COSBY is a 53 y.o. male with medical history significant for cerebral palsy with spastic quadriplegia, hypertension, type 2 diabetes mellitus, OSA, and chronic HFpEF who presents with nausea and vomiting.  Patient was discharged from the hospital on 08/02/2023 after management of fecal impaction with stercoral colitis.  Back at home, he continued to have some loose stools after the aggressive bowel regimen in the hospital, and also developed nausea with recurrent bouts of nonbloody vomiting.  He has not had any abdominal pain, fever, chills, or urinary symptoms associated with this.  ED Course: Upon arrival to the ED, patient is found to be afebrile and saturating well on room air with normal HR and stable BP.  Labs are most notable for elevated BUN to creatinine ratio, normal WBC, normal LFTs, and normal lipase.  Patient was treated with a liter of saline and 2 doses of Zofran  in the ED.  He continued to vomit and Hospitalists were asked to admit.  Review of Systems:  All other systems reviewed and apart from HPI, are negative.  Past Medical History:  Diagnosis Date   Anxiety    Asthma    Cellulitis 08/04/2017   Cerebral palsy (HCC)    Chronic diastolic (congestive) heart failure (HCC)    CP (cerebral palsy) (HCC)    Diabetes mellitus without complication (HCC)    borderline   Drug induced constipation    Environmental allergies    takes inhalers if needed   Esophageal stricture    GERD (gastroesophageal reflux disease)    Hypertension    Intractable nausea and vomiting 08/04/2023   Motility disorder, esophageal    Pneumonia    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HCC)    S/P Botox  injection    approx every 4 months   Seasonal allergies     Past Surgical History:  Procedure Laterality Date    BIOPSY  06/24/2021   Procedure: BIOPSY;  Surgeon: Albertina Hugger, MD;  Location: [facility];  Service: Gastroenterology;;   BOTOX  INJECTION N/A 12/21/2012   Procedure: BOTOX  INJECTION;  Surgeon: Claudette Cue, MD;  Location: [facility] ENDOSCOPY;  Service: Endoscopy;  Laterality: N/A;   BOTOX  INJECTION N/A 06/28/2014   Procedure: BOTOX  INJECTION;  Surgeon: Claudette Cue, MD;  Location: [facility] ENDOSCOPY;  Service: Endoscopy;  Laterality: N/A;   COLONOSCOPY WITH PROPOFOL  N/A 06/24/2021   Procedure: COLONOSCOPY WITH PROPOFOL ;  Surgeon: Albertina Hugger, MD;  Location: [facility] ENDOSCOPY;  Service: Gastroenterology;  Laterality: N/A;   ESOPHAGOGASTRODUODENOSCOPY N/A 12/21/2012   Procedure: ESOPHAGOGASTRODUODENOSCOPY (EGD);  Surgeon: Claudette Cue, MD;  Location: [facility];  Service: Endoscopy;  Laterality: N/A;   ESOPHAGOGASTRODUODENOSCOPY N/A 06/28/2014   Procedure: ESOPHAGOGASTRODUODENOSCOPY (EGD);  Surgeon: Claudette Cue, MD;  Location: [facility] ENDOSCOPY;  Service: Endoscopy;  Laterality: N/A;   ESOPHAGOGASTRODUODENOSCOPY N/A 06/24/2021   Procedure: ESOPHAGOGASTRODUODENOSCOPY (EGD);  Surgeon: Albertina Hugger, MD;  Location: [facility] ENDOSCOPY;  Service: Gastroenterology;  Laterality: N/A;   ESOPHAGOGASTRODUODENOSCOPY (EGD) WITH PROPOFOL  N/A 09/03/2017   Procedure: ESOPHAGOGASTRODUODENOSCOPY (EGD) WITH PROPOFOL ;  Surgeon: Albertina Hugger, MD;  Location: [facility] ENDOSCOPY;  Service: Gastroenterology;  Laterality: N/A;   ESOPHAGUS SURGERY     stretched esophagus   legs     MOUTH SURGERY     POLYPECTOMY  06/24/2021   Procedure:  POLYPECTOMY;  Surgeon: Albertina Hugger, MD;  Location: [facility];  Service: Gastroenterology;;   TENDON RELEASE      Social History:   reports that he has never smoked. He has quit using smokeless tobacco. He reports current alcohol use. He reports that he does not use drugs.  Allergies  Allergen Reactions   Penicillins Hives, Nausea And Vomiting and Other (See Comments)     Patient tolerated cefazolin  in 2017   Sulfamethoxazole -Trimethoprim  Nausea And Vomiting   Metronidazole  Nausea And Vomiting    Family History  Problem Relation Age of Onset   Hypertension Father    Lung cancer Father    Diabetes Mother      Prior to Admission medications   Medication Sig Start Date End Date Taking? Authorizing Provider  acetaminophen  (TYLENOL ) 325 MG tablet Take 2 tablets (650 mg total) by mouth every 6 (six) hours as needed for mild pain (pain score 1-3), moderate pain (pain score 4-6) or fever (or Fever >/= 101). 04/23/23   Hongalgi, Thomasene Flemings, MD  Albuterol  Sulfate (PROAIR  RESPICLICK) 108 (90 Base) MCG/ACT AEPB Inhale 2 puffs into the lungs every 6 (six) hours as needed (Shortness of breath). 05/03/20   [provider]  amLODipine  (NORVASC ) 10 MG tablet Take 10 mg by mouth daily. 07/19/23   [provider]  baclofen  (LIORESAL ) 10 MG tablet Take 2.5 mg by mouth at bedtime. 06/01/18   [provider]  ciprofloxacin  (CIPRO ) 500 MG tablet Take 1 tablet (500 mg total) by mouth 2 (two) times daily for 5 days. 08/02/23 08/07/23  Aura Leeds Latif, DO  furosemide  (LASIX ) 20 MG tablet Take 20 mg by mouth daily. 10/23/20   [provider]  latanoprost  (XALATAN ) 0.005 % ophthalmic solution Place 1 drop into both eyes at bedtime. 08/25/21   [provider]  metoprolol  succinate (TOPROL -XL) 25 MG 24 hr tablet Take 0.5 tablets (12.5 mg total) by mouth daily. Patient taking differently: Take 25 mg by mouth daily. 07/01/23   Meng, Hao, PA  metroNIDAZOLE  (FLAGYL ) 500 MG tablet Take 1 tablet (500 mg total) by mouth every 12 (twelve) hours for 5 days. 08/02/23 08/07/23  Aura Leeds Latif, DO  montelukast  (SINGULAIR ) 10 MG tablet Take 1 tablet by mouth daily. 07/28/21   [provider]  nitroGLYCERIN  (NITROSTAT ) 0.4 MG SL tablet Place 0.4 mg under the tongue every 5 (five) minutes as needed for chest pain.    [provider]  omeprazole   (PRILOSEC) 20 MG capsule Take 1 capsule by mouth every morning. 12/12/19   [provider]  ondansetron  (ZOFRAN -ODT) 4 MG disintegrating tablet Take 1 tablet (4 mg total) by mouth every 8 (eight) hours as needed for nausea or vomiting. 08/02/23   Sheikh, Omair Latif, DO  polyethylene glycol powder (GLYCOLAX /MIRALAX ) 17 GM/SCOOP powder Take 17 g by mouth daily. 08/02/23   Aura Leeds Latif, DO  rosuvastatin  (CRESTOR ) 5 MG tablet Take 5 mg by mouth at bedtime.    [provider]  Semaglutide,0.25 or 0.5MG /DOS, 2 MG/3ML SOPN Inject 0.25 mg into the skin once a week. 06/15/23   [provider]  senna-docusate (SENOKOT-S) 8.6-50 MG tablet Take 1 tablet by mouth at bedtime. 08/02/23 09/01/23  Aura Leeds Latif, DO  simethicone  (MYLICON) 80 MG chewable tablet Chew 1 tablet (80 mg total) by mouth 4 (four) times daily as needed for flatulence. 08/02/23   Sheikh, Jonel Nephew Latif, DO  timolol  (TIMOPTIC ) 0.5 % ophthalmic solution Place 1 drop into both eyes 2 (  two) times daily. 04/10/20   [provider]    Physical Exam: Vitals:   08/03/23 2330 08/03/23 2343 08/04/23 0337 08/04/23 0403  BP: 113/81 101/88  103/67  Pulse:  82  80  Resp: (!) 23 16  18   Temp:  97.6 F (36.4 C) 97.7 F (36.5 C) 97.8 F (36.6 C)  TempSrc:  Oral Oral Oral  SpO2:  98%  99%  Weight:    59.4 kg  Height:    5\' 5"  (1.651 m)    Constitutional: NAD, no pallor or diaphoresis  Eyes: PERTLA, lids and conjunctivae normal ENMT: Mucous membranes are moist. Posterior pharynx clear of any exudate or lesions.   Neck: supple, no masses  Respiratory: no wheezing, no crackles. No accessory muscle use.  Cardiovascular: S1 & S2 heard, regular rate and rhythm. No JVD. Abdomen: Soft, no tenderness. Bowel sounds active.  Musculoskeletal: no clubbing / cyanosis. Contracted.   Skin: no significant rashes, lesions, ulcers. Warm, dry, well-perfused. Neurologic: No gross facial asymmetry. Moving upper extremities.  Alert and oriented.  Psychiatric: Calm. Cooperative.    Labs and Imaging on Admission: I have personally reviewed following labs and imaging studies  CBC: Recent Labs  Lab 07/30/23 0315 07/31/23 0357 08/01/23 0907 08/02/23 0733 08/04/23 0001  WBC 5.8 5.3 5.3 5.0 8.9  NEUTROABS 3.9 3.0 3.6 3.1 7.3  HGB 12.4* 12.3* 12.7* 13.5 13.7  HCT 38.0* 37.5* 38.2* 41.1 41.7  MCV 79.7* 79.3* 77.6* 79.5* 80.7  PLT 210 183 175 190 218   Basic Metabolic Panel: Recent Labs  Lab 07/30/23 0315 07/31/23 0357 08/01/23 0907 08/02/23 0733 08/04/23 0001  NA 137 134* 131* 134* 133*  K 3.4* 4.8 4.1 4.5 4.4  CL 100 101 97* 95* 98  CO2 27 26 25 28 24   GLUCOSE 108* 91 176* 96 157*  BUN 14 14 19 19  23*  CREATININE 0.84 0.91 1.02 1.00 0.68  CALCIUM  8.9 8.8* 9.1 9.3 9.7  MG  --  2.9* 2.4 3.2*  --   PHOS  --  3.0 3.3 3.2  --    GFR: Estimated Creatinine Clearance: 91.8 mL/min (by C-G formula based on SCr of 0.68 mg/dL). Liver Function Tests: Recent Labs  Lab 07/30/23 0315 07/31/23 0357 08/01/23 0907 08/02/23 0733 08/04/23 0001  AST 12* 10* 13* 13* 15  ALT 8 8 9 8 10   ALKPHOS 96 84 88 89 99  BILITOT 0.7 0.7 0.5 0.4 0.6  PROT 6.3* 6.1* 6.5 6.8 7.2  ALBUMIN 3.5 3.4* 3.6 3.6 4.0   Recent Labs  Lab 07/29/23 1416 08/04/23 0001  LIPASE 32 30   No results for input(s): "AMMONIA" in the last 168 hours. Coagulation Profile: No results for input(s): "INR", "PROTIME" in the last 168 hours. Cardiac Enzymes: No results for input(s): "CKTOTAL", "CKMB", "CKMBINDEX", "TROPONINI" in the last 168 hours. BNP (last 3 results) No results for input(s): "PROBNP" in the last 8760 hours. HbA1C: No results for input(s): "HGBA1C" in the last 72 hours. CBG: Recent Labs  Lab 08/01/23 1204 08/01/23 1713 08/01/23 2103 08/02/23 0808 08/02/23 1207  GLUCAP 95 159* 116* 104* 127*   Lipid Profile: No results for input(s): "CHOL", "HDL", "LDLCALC", "TRIG", "CHOLHDL", "LDLDIRECT" in the last 72  hours. Thyroid Function Tests: No results for input(s): "TSH", "T4TOTAL", "FREET4", "T3FREE", "THYROIDAB" in the last 72 hours. Anemia Panel: No results for input(s): "VITAMINB12", "FOLATE", "FERRITIN", "TIBC", "IRON", "RETICCTPCT" in the last 72 hours. Urine analysis:    Component Value Date/Time   COLORURINE YELLOW  07/29/2023 1705   APPEARANCEUR CLEAR 07/29/2023 1705   LABSPEC 1.010 07/29/2023 1705   PHURINE 5.0 07/29/2023 1705   GLUCOSEU NEGATIVE 07/29/2023 1705   HGBUR NEGATIVE 07/29/2023 1705   BILIRUBINUR NEGATIVE 07/29/2023 1705   KETONESUR NEGATIVE 07/29/2023 1705   PROTEINUR NEGATIVE 07/29/2023 1705   UROBILINOGEN 1.0 05/26/2014 2211   NITRITE NEGATIVE 07/29/2023 1705   LEUKOCYTESUR NEGATIVE 07/29/2023 1705   Sepsis Labs: @LABRCNTIP (procalcitonin:4,lacticidven:4) )No results found for this or any previous visit (from the past 240 hours).   Radiological Exams on Admission: DG Abd 1 View Result Date: 08/02/2023 CLINICAL DATA:  Constipation. EXAM: ABDOMEN - 1 VIEW COMPARISON:  Abdominal radiograph dated 08/01/2023. FINDINGS: Mild colonic stool burden. No bowel dilatation. Air is noted in the colon. Osteopenia with degenerative changes of the spine. Chronic bilateral hip deformity. No acute osseous pathology. IMPRESSION: Mild colonic stool burden. No bowel dilatation. Electronically Signed   By: Angus Bark M.D.   On: 08/02/2023 09:52    EKG: Independently reviewed. Sinus rhythm, QTc 433.   Assessment/Plan   1. Intractable N/V  - Patient denies abdominal pain and exam is benign; he is moving his bowels  - Check KUB, continue IVF hydration, antiemetics as-needed, monitor electrolytes, trial clears when feeling better and advance diet as he improves   2. Hypertension  - Continue Norvasc  and Toprol  as tolerated   3. Chronic HFpEF  - Hold diuretic for now, monitor volume status    4. Cerebral palsy  - Supportive care, baclofen     5. Type II DM  - Check CBGs and  use low-intensity SSI for now     DVT prophylaxis: Lovenox   Code Status: Full  Level of Care: Level of care: Med-Surg Family Communication: None present  Disposition Plan:  Patient is from: home  Anticipated d/c is to: Home  Anticipated d/c date is: 08/05/23  Patient currently: Pending tolerance of adequate oral intake  Consults called: None  Admission status: Observation     Walton Guppy, MD [facility]  08/04/2023, 4:16 AM

## 2023-08-04 NOTE — Progress Notes (Signed)
 PROGRESS NOTE    Thomas Ramos  XBJ:[id number] DOB: 04-19-1970 DOA: 08/03/2023 PCP: Jearlean Mince, PA-C  Subjective: Pt seen and examined. Known to me from prior admission. Lives at home with his caretaker. Usually has his power wheelchair but pt states he came to ER via EMS.  Abd XR shows "adynamic ileus". Pt is passing flatus and is hungry. Tolerated clear liquids and full liquids. He wants solid food. No N/V.    Hospital Course: HPI: Thomas Ramos is a 53 y.o. male with medical history significant for cerebral palsy with spastic quadriplegia, hypertension, type 2 diabetes mellitus, OSA, and chronic HFpEF who presents with nausea and vomiting.   Patient was discharged from the hospital on 08/02/2023 after management of fecal impaction with stercoral colitis.  Back at home, he continued to have some loose stools after the aggressive bowel regimen in the hospital, and also developed nausea with recurrent bouts of nonbloody vomiting.  He has not had any abdominal pain, fever, chills, or urinary symptoms associated with this.   ED Course: Upon arrival to the ED, patient is found to be afebrile and saturating well on room air with normal HR and stable BP.  Labs are most notable for elevated BUN to creatinine ratio, normal WBC, normal LFTs, and normal lipase.   Patient was treated with a liter of saline and 2 doses of Zofran  in the ED.  He continued to vomit and Hospitalists were asked to admit.  Significant Events: Admitted 08/03/2023 for intractable N/V   Admission Labs: Na 133, K 4.4, CO2 of 24, BUN 23, Scr 0.68, glu 157 Lipase 30 WBC 8.9, HgB 13.7, plt 218  Admission Imaging Studies: KUB Gaseous distension of the small bowel with more pronounced gaseous distension of the colon, suggesting an adynamic ileus.  Significant Labs:   Significant Imaging Studies:   Antibiotic Therapy: Anti-infectives (From admission, onward)    None        Procedures:   Consultants:     Assessment and Plan: * Intractable nausea and vomiting 08-04-2023 resolved. Start solid food. If pt can eat and pass flatus, he can go home tomorrow.  Type 2 diabetes mellitus with complication, without long-term current use of insulin  (HCC) 08-04-2023 on SSI.  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 Pt is AxOx4. Uses power wheelchair. 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  Essential hypertension 08-04-2023 stable.  OSA (obstructive sleep apnea) 08-04-2023 stable.  Chronic diastolic (congestive) heart failure (HCC) 08-04-2023 stable. Euvolemic.    DVT prophylaxis: enoxaparin  (LOVENOX ) injection 40 mg Start: 08/04/23 1000    Code Status: Full Code Family Communication: pt is decisional. Disposition Plan: return home Reason for continuing need for hospitalization: monitor pt's abd girth while he is eating solid food.   Objective: Vitals:   08/04/23 0337 08/04/23 0403 08/04/23 0910 08/04/23 1215  BP:  103/67 109/67 114/68  Pulse:  80 69 76  Resp:  18  16  Temp: 97.7 F (36.5 C) 97.8 F (36.6 C)  98.1 F (36.7 C)  TempSrc: Oral Oral  Oral  SpO2:  99%  98%  Weight:  59.4 kg    Height:  5\' 5"  (1.651 m)      Intake/Output Summary (Last 24 hours) at 08/04/2023 1729 Last data filed at 08/04/2023 1600 Gross per 24 hour  Intake 808.68 ml  Output 250 ml  Net 558.68 ml   Filed Weights   08/04/23 0403  Weight: 59.4 kg  Examination:  Physical Exam Vitals and nursing note reviewed.  Constitutional:      General: He is not in acute distress.    Appearance: He is not toxic-appearing.  HENT:     Head: Normocephalic and atraumatic.     Nose: Nose normal.  Cardiovascular:     Rate and Rhythm: Normal rate and regular rhythm.  Pulmonary:     Effort: Pulmonary effort is normal.     Breath sounds: Normal breath sounds.  Abdominal:     General: Bowel  sounds are normal. There is no distension.  Musculoskeletal:     Comments: Flexion contractures of elbows, wrists, knees, hips bilaterally. Chronic.  Skin:    General: Skin is warm and dry.     Capillary Refill: Capillary refill takes less than 2 seconds.  Neurological:     Mental Status: He is alert and oriented to person, place, and time.     Data Reviewed: I have personally reviewed following labs and imaging studies  CBC: Recent Labs  Lab 07/30/23 0315 07/31/23 0357 08/01/23 0907 08/02/23 0733 08/04/23 0001  WBC 5.8 5.3 5.3 5.0 8.9  NEUTROABS 3.9 3.0 3.6 3.1 7.3  HGB 12.4* 12.3* 12.7* 13.5 13.7  HCT 38.0* 37.5* 38.2* 41.1 41.7  MCV 79.7* 79.3* 77.6* 79.5* 80.7  PLT 210 183 175 190 218   Basic Metabolic Panel: Recent Labs  Lab 07/30/23 0315 07/31/23 0357 08/01/23 0907 08/02/23 0733 08/04/23 0001  NA 137 134* 131* 134* 133*  K 3.4* 4.8 4.1 4.5 4.4  CL 100 101 97* 95* 98  CO2 27 26 25 28 24   GLUCOSE 108* 91 176* 96 157*  BUN 14 14 19 19  23*  CREATININE 0.84 0.91 1.02 1.00 0.68  CALCIUM  8.9 8.8* 9.1 9.3 9.7  MG  --  2.9* 2.4 3.2*  --   PHOS  --  3.0 3.3 3.2  --    GFR: Estimated Creatinine Clearance: 91.8 mL/min (by C-G formula based on SCr of 0.68 mg/dL). Liver Function Tests: Recent Labs  Lab 07/30/23 0315 07/31/23 0357 08/01/23 0907 08/02/23 0733 08/04/23 0001  AST 12* 10* 13* 13* 15  ALT 8 8 9 8 10   ALKPHOS 96 84 88 89 99  BILITOT 0.7 0.7 0.5 0.4 0.6  PROT 6.3* 6.1* 6.5 6.8 7.2  ALBUMIN 3.5 3.4* 3.6 3.6 4.0   Recent Labs  Lab 07/29/23 1416 08/04/23 0001  LIPASE 32 30   CBG: Recent Labs  Lab 08/02/23 1207 08/04/23 0436 08/04/23 0806 08/04/23 1214 08/04/23 1634  GLUCAP 127* 130* 108* 107* 146*   Radiology Studies: DG Abd 1 View Result Date: 08/04/2023 CLINICAL DATA:  [id number] Intractable nausea and vomiting [id number] EXAM: ABDOMEN - 1 VIEW COMPARISON:  Aug 02, 2023 FINDINGS: Gaseous distension of the small bowel with more pronounced  gaseous distension of the colon. No visualized pneumoperitoneum. The upper abdomen was excluded from the field of view, limiting evaluation for organomegaly. No acute fracture or destructive lesion. IMPRESSION: Gaseous distension of the small bowel with more pronounced gaseous distension of the colon, suggesting an adynamic ileus. Electronically Signed   By: Rance Burrows M.D.   On: 08/04/2023 09:11    Scheduled Meds:  amLODipine   10 mg Oral Daily   baclofen   5 mg Oral QHS   enoxaparin  (LOVENOX ) injection  40 mg Subcutaneous Q24H   [START ON 08/05/2023] insulin  aspart  0-6 Units Subcutaneous TID WC   latanoprost   1 drop Both Eyes QHS   metoprolol  succinate  12.5  mg Oral Daily   montelukast   10 mg Oral Daily   rosuvastatin   5 mg Oral QHS   timolol   1 drop Both Eyes BID   Continuous Infusions:  lactated ringers  75 mL/hr at 08/04/23 0438   promethazine  (PHENERGAN ) injection (IM or IVPB)       LOS: 0 days   Time spent: 45 minutes  Unk Garb, DO  [facility]  08/04/2023, 5:29 PM

## 2023-08-04 NOTE — Assessment & Plan Note (Addendum)
 08-04-2023 on SSI.  08-05-2023 stable.

## 2023-08-04 NOTE — Assessment & Plan Note (Addendum)
 08-04-2023 chronic.  08-05-2023 stable.

## 2023-08-04 NOTE — Subjective & Objective (Addendum)
 Pt seen and examined. Ate dinner last night and breakfast this AM without any N/V. Passing flatus well. No abd pain.

## 2023-08-04 NOTE — Plan of Care (Signed)

## 2023-08-04 NOTE — Assessment & Plan Note (Addendum)
 08-04-2023 stable. Euvolemic.   08-05-2023 stable.

## 2023-08-04 NOTE — Assessment & Plan Note (Addendum)
 08-04-2023 stable.  08-05-2023 stable. He does not use CPAP.

## 2023-08-04 NOTE — Care Management Obs Status (Signed)
 MEDICARE OBSERVATION STATUS NOTIFICATION   Patient Details  Name: Thomas Ramos MRN: [id number] Date of Birth: 22-Mar-1970   Medicare Observation Status Notification Given:  Yes    Kathryn Parish, RN 08/04/2023, 10:57 AM

## 2023-08-04 NOTE — Assessment & Plan Note (Addendum)
 08-04-2023 resolved. Start solid food. If pt can eat and pass flatus, he can go home tomorrow.  08-05-2023 abd XR yesterday suggested adynamic colonic ileus but he had great BS yesterday. Repeat abd xrays today shows less gaseous distension of colon but still awaiting final read.  If pt tolerates lunch today without N/V, I think he can go home this afternoon. Pt states he has passed flatus multiple times last night and today.

## 2023-08-04 NOTE — Assessment & Plan Note (Addendum)
 08-04-2023 stable.  08-05-2023 stable.

## 2023-08-04 NOTE — Hospital Course (Addendum)
 HPI: Thomas Ramos is a 53 y.o. male with medical history significant for cerebral palsy with spastic quadriplegia, hypertension, type 2 diabetes mellitus, OSA, and chronic HFpEF who presents with nausea and vomiting.   Patient was discharged from the hospital on 08/02/2023 after management of fecal impaction with stercoral colitis.  Back at home, he continued to have some loose stools after the aggressive bowel regimen in the hospital, and also developed nausea with recurrent bouts of nonbloody vomiting.  He has not had any abdominal pain, fever, chills, or urinary symptoms associated with this.   ED Course: Upon arrival to the ED, patient is found to be afebrile and saturating well on room air with normal HR and stable BP.  Labs are most notable for elevated BUN to creatinine ratio, normal WBC, normal LFTs, and normal lipase.   Patient was treated with a liter of saline and 2 doses of Zofran  in the ED.  He continued to vomit and Hospitalists were asked to admit.  Significant Events: Admitted 08/03/2023 for intractable N/V   Admission Labs: Na 133, K 4.4, CO2 of 24, BUN 23, Scr 0.68, glu 157 Lipase 30 WBC 8.9, HgB 13.7, plt 218  Admission Imaging Studies: KUB Gaseous distension of the small bowel with more pronounced gaseous distension of the colon, suggesting an adynamic ileus.  Significant Labs:   Significant Imaging Studies:   Antibiotic Therapy: Anti-infectives (From admission, onward)    None       Procedures:   Consultants:

## 2023-08-04 NOTE — TOC Initial Note (Addendum)
 Transition of Care [facility]) - Initial/Assessment Note    Patient Details  Name: Thomas Ramos MRN: [id number] Date of Birth: [birth year]  Transition [facility] CM/SW Contact:    Kathryn Parish, RN Phone Number: 08/04/2023, 11:38 AM  Clinical Narrative:                 MOON completed. NCM meet with patient in the room. The patient is alert, oriented, able to answer questions and provide history without difficulty.  PTA lives in apartment alone. Patient states he des not have a legal guardian. However, does have a caregiver, Blanchie Bunkers [phone] that lives next door that provides 24 hour care and assistance. Verified PCP/insurance; DME hospital bed, hoyer lift and electric wheel chair; No HH or SDOH needs; will need PTAR transport home. CM did a chart search for legal guardianship papers and none were found. Removed the legal guardian information.    CM will continue to follow the patient for transportation coordination for discharge - pending medical stability to return home.    Expected Discharge Plan: Home/Self Care Barriers to Discharge: Continued Medical Work up   Patient Goals and CMS Choice Patient states their goals for this hospitalization and ongoing recovery are:: Home   Choice offered to / list presented to : NA      Expected Discharge Plan and Services   Discharge Planning Services: CM Consult Post Acute Care Choice: NA Living arrangements for the past 2 months: Apartment                 DME Arranged: N/A DME Agency: NA       HH Arranged: NA HH Agency: NA        Prior Living Arrangements/Services Living arrangements for the past 2 months: Apartment Lives with:: Self Patient language and need for interpreter reviewed:: Yes Do you feel safe going back to the place where you live?: Yes      Need for Family Participation in Patient Care: Yes (Comment) Care giver support system in place?: Yes (comment) Current home services: DME (hospital bed, WC,  hoyer lift) Criminal Activity/Legal Involvement Pertinent to Current Situation/Hospitalization: No - Comment as needed  Activities of Daily Living   ADL Screening (condition at time of admission) Independently performs ADLs?: No Does the patient have a NEW difficulty with bathing/dressing/toileting/self-feeding that is expected to last >3 days?: No Does the patient have a NEW difficulty with getting in/out of bed, walking, or climbing stairs that is expected to last >3 days?: No Does the patient have a NEW difficulty with communication that is expected to last >3 days?: No Is the patient deaf or have difficulty hearing?: No Does the patient have difficulty seeing, even when wearing glasses/contacts?: No Does the patient have difficulty concentrating, remembering, or making decisions?: No  Permission Sought/Granted Permission sought to share information with : Case Manager Permission granted to share information with : Yes, Verbal Permission Granted  Share Information with NAME: [name] and Darlene     Permission granted to share info w Relationship: Care giver and sister  Permission granted to share info w Contact Information: Lynnie Saucier [phone]   sister [phone]  Emotional Assessment Appearance:: Appears stated age Attitude/Demeanor/Rapport: Gracious Affect (typically observed): Appropriate Orientation: : Oriented to Self, Oriented to Place, Oriented to  Time, Oriented to Situation Alcohol / Substance Use: Not Applicable Psych Involvement: No (comment)  Admission diagnosis:  Intractable vomiting [R11.10] Intractable nausea and vomiting [R11.2] Patient Active Problem List   Diagnosis Date  Noted   Intractable nausea and vomiting 08/04/2023   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   Left leg cellulitis 04/20/2023   Heart murmur 02/26/2020   Hiatal hernia    Type 2 diabetes mellitus with complication, without long-term current use of insulin  (HCC) 08/03/2017   Quadriplegic spinal  paralysis (HCC)    Pressure injury of skin 08/16/2016   Onychomycosis 02/20/2016   Essential hypertension    OSA (obstructive sleep apnea) 12/11/2015   Chronic diastolic (congestive) heart failure (HCC)    Chronic venous insufficiency    Depression with anxiety 09/20/2015   Hypersomnia 07/16/2015   GERD (gastroesophageal reflux disease) 07/16/2015   Snoring 07/16/2015   Hyperlipidemia 02/22/2012   Dysphagia 09/16/2010   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HCC) 09/16/2010   PCP:  Jearlean Mince, PA-C Pharmacy:   [facility] [place], [place] - [address] [place] [address] Phone: [phone] Fax: [phone]  [facility] [address] [place] [address] Phone: [phone] Fax: [phone]     Social Drivers of Health (SDOH) Social History: SDOH Screenings   Food Insecurity: No Food Insecurity (08/04/2023)  Housing: Low Risk  (08/04/2023)  Transportation Needs: No Transportation Needs (08/04/2023)  Utilities: Not At Risk (08/04/2023)  Financial Resource Strain: Low Risk  (04/20/2023)   Received from [facility]  Physical Activity: Unknown (03/31/2022)   Received from [facility], [facility]  Social Connections: Socially Isolated (08/04/2023)  Stress: No Stress Concern Present (03/31/2022)   Received from [facility], [facility]  Tobacco Use: Medium Risk (08/04/2023)   SDOH Interventions:     Readmission Risk Interventions    07/15/2022    2:11 PM  Readmission Risk Prevention Plan  Post Dischage Appt Complete  Medication Screening Complete  Transportation Screening Complete

## 2023-08-05 ENCOUNTER — Observation Stay (HOSPITAL_COMMUNITY)

## 2023-08-05 DIAGNOSIS — G809 Cerebral palsy, unspecified: Secondary | ICD-10-CM | POA: Diagnosis not present

## 2023-08-05 DIAGNOSIS — K5904 Chronic idiopathic constipation: Secondary | ICD-10-CM | POA: Diagnosis not present

## 2023-08-05 DIAGNOSIS — I5032 Chronic diastolic (congestive) heart failure: Secondary | ICD-10-CM | POA: Diagnosis not present

## 2023-08-05 DIAGNOSIS — R112 Nausea with vomiting, unspecified: Secondary | ICD-10-CM | POA: Diagnosis not present

## 2023-08-05 LAB — BASIC METABOLIC PANEL WITH GFR
Anion gap: 8 (ref 5–15)
BUN: 16 mg/dL (ref 6–20)
CO2: 24 mmol/L (ref 22–32)
Calcium: 8.6 mg/dL — ABNORMAL LOW (ref 8.9–10.3)
Chloride: 103 mmol/L (ref 98–111)
Creatinine, Ser: 0.67 mg/dL (ref 0.61–1.24)
GFR, Estimated: >60 mL/min (ref >60)
Glucose, Bld: 107 mg/dL — ABNORMAL HIGH (ref 70–99)
Potassium: 4.5 mmol/L (ref 3.5–5.1)
Sodium: 135 mmol/L (ref 135–145)

## 2023-08-05 LAB — GLUCOSE, CAPILLARY
Glucose-Capillary: 116 mg/dL — ABNORMAL HIGH (ref 70–99)
Glucose-Capillary: 106 mg/dL — ABNORMAL HIGH (ref 70–99)
Glucose-Capillary: 99 mg/dL (ref 70–99)
Glucose-Capillary: 110 mg/dL — ABNORMAL HIGH (ref 70–99)

## 2023-08-05 LAB — MAGNESIUM: Magnesium: 2.2 mg/dL (ref 1.7–2.4)

## 2023-08-05 LAB — CBC
HCT: 27.6 % — ABNORMAL LOW (ref 39.0–52.0)
Hemoglobin: 8.6 g/dL — ABNORMAL LOW (ref 13.0–17.0)
MCH: 26.1 pg (ref 26.0–34.0)
MCHC: 31.2 g/dL (ref 30.0–36.0)
MCV: 83.9 fL (ref 80.0–100.0)
Platelets: 120 10*3/uL — ABNORMAL LOW (ref 150–400)
RBC: 3.29 MIL/uL — ABNORMAL LOW (ref 4.22–5.81)
RDW: 13.3 % (ref 11.5–15.5)
WBC: 3.5 10*3/uL — ABNORMAL LOW (ref 4.0–10.5)
nRBC: 0 % (ref 0.0–0.2)

## 2023-08-05 LAB — HEMOGLOBIN A1C
Hgb A1c MFr Bld: 5.7 % — ABNORMAL HIGH (ref 4.8–5.6)
Mean Plasma Glucose: 116.89 mg/dL

## 2023-08-05 MED ORDER — SENNOSIDES-DOCUSATE SODIUM 8.6-50 MG PO TABS: 2.0000 | ORAL_TABLET | Freq: Two times a day (BID) | ORAL | 0 refills | Status: AC

## 2023-08-05 MED ORDER — POLYETHYLENE GLYCOL 3350 17 GM/SCOOP PO POWD: 34.0000 g | Freq: Every day | ORAL | 0 refills | Status: DC

## 2023-08-05 NOTE — Progress Notes (Signed)
 Patient tolerated both lunch and breakfast, no N/V, pt requesting to go home.

## 2023-08-05 NOTE — Plan of Care (Signed)

## 2023-08-05 NOTE — Assessment & Plan Note (Signed)
 08-05-2023 pt to remain on daily or Bid laxative to prevent future occurrences of fecal impactions.

## 2023-08-05 NOTE — Progress Notes (Signed)
 PROGRESS NOTE    Thomas Ramos  ZOX:[id number] DOB: [birth year] DOA: 08/03/2023 PCP: Jearlean Mince, PA-C  Subjective: Pt seen and examined. Ate dinner last night and breakfast this AM without any N/V. Passing flatus well. No abd pain.   Hospital Course: HPI: Thomas Ramos is a 53 y.o. male with medical history significant for cerebral palsy with spastic quadriplegia, hypertension, type 2 diabetes mellitus, OSA, and chronic HFpEF who presents with nausea and vomiting.   Patient was discharged from the hospital on 08/02/2023 after management of fecal impaction with stercoral colitis.  Back at home, he continued to have some loose stools after the aggressive bowel regimen in the hospital, and also developed nausea with recurrent bouts of nonbloody vomiting.  He has not had any abdominal pain, fever, chills, or urinary symptoms associated with this.   ED Course: Upon arrival to the ED, patient is found to be afebrile and saturating well on room air with normal HR and stable BP.  Labs are most notable for elevated BUN to creatinine ratio, normal WBC, normal LFTs, and normal lipase.   Patient was treated with a liter of saline and 2 doses of Zofran  in the ED.  He continued to vomit and Hospitalists were asked to admit.  Significant Events: Admitted 08/03/2023 for intractable N/V   Admission Labs: Na 133, K 4.4, CO2 of 24, BUN 23, Scr 0.68, glu 157 Lipase 30 WBC 8.9, HgB 13.7, plt 218  Admission Imaging Studies: KUB Gaseous distension of the small bowel with more pronounced gaseous distension of the colon, suggesting an adynamic ileus.  Significant Labs:   Significant Imaging Studies:   Antibiotic Therapy: Anti-infectives (From admission, onward)    None       Procedures:   Consultants:     Assessment and Plan: * Intractable nausea and vomiting 08-04-2023 resolved. Start solid food. If pt can eat and pass flatus, he can go home tomorrow.  08-05-2023 abd XR  yesterday suggested adynamic colonic ileus but he had great BS yesterday. Repeat abd xrays today shows less gaseous distension of colon but still awaiting final read.  If pt tolerates lunch today without N/V, I think he can go home this afternoon. Pt states he has passed flatus multiple times last night and today.  Type 2 diabetes mellitus with complication, without long-term current use of insulin  (HCC) 08-04-2023 on SSI.  08-05-2023 stable.  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 Pt is AxOx4. Uses power wheelchair. Lives [place]door to caretaker Blanchie Bunkers   08-05-2023 stable. He will need transport to home via ambulance. He does not have his power wheelchair with him in the hospital.  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  08-05-2023 stable.  Essential hypertension 08-04-2023 stable.  08-05-2023 stable.  OSA (obstructive sleep apnea) 08-04-2023 stable.  08-05-2023 stable. He does not use CPAP.  Chronic diastolic (congestive) heart failure (HCC) 08-04-2023 stable. Euvolemic.   08-05-2023 stable.  DVT prophylaxis: enoxaparin  (LOVENOX ) injection 40 mg Start: 08/04/23 1000    Code Status: Full Code Family Communication: pt is decisional. No family at bedside Disposition Plan: return home Reason for continuing need for hospitalization: medically stable.  Objective: Vitals:   08/04/23 0910 08/04/23 1215 08/04/23 1947 08/05/23 0432  BP: 109/67 114/68 102/63 105/68  Pulse: 69 76 80 (!) 54  Resp:  16 20 18   Temp:  98.1 F (36.7 C) 97.6 F (36.4 C) 98.5 F (36.9 C)  TempSrc:  Oral Oral   SpO2:  98% 97% 100%  Weight:      Height:        Intake/Output Summary (Last 24 hours) at 08/05/2023 1129 Last data filed at 08/05/2023 0913 Gross per 24 hour  Intake 1687.27 ml  Output 750 ml  Net 937.27 ml   Filed Weights   08/04/23 0403  Weight: 59.4 kg    Examination:  Physical Exam Vitals and nursing note reviewed.  Constitutional:       Appearance: He is not toxic-appearing.  HENT:     Head: Normocephalic and atraumatic.  Cardiovascular:     Rate and Rhythm: Normal rate and regular rhythm.  Pulmonary:     Effort: Pulmonary effort is normal.     Breath sounds: Normal breath sounds.  Abdominal:     General: Bowel sounds are normal. There is no distension.     Palpations: Abdomen is soft.     Tenderness: There is no abdominal tenderness.  Skin:    General: Skin is warm and dry.  Neurological:     Mental Status: He is alert and oriented to person, place, and time.     Data Reviewed: I have personally reviewed following labs and imaging studies  CBC: Recent Labs  Lab 07/30/23 0315 07/31/23 0357 08/01/23 0907 08/02/23 0733 08/04/23 0001 08/05/23 0453  WBC 5.8 5.3 5.3 5.0 8.9 3.5*  NEUTROABS 3.9 3.0 3.6 3.1 7.3  --   HGB 12.4* 12.3* 12.7* 13.5 13.7 8.6*  HCT 38.0* 37.5* 38.2* 41.1 41.7 27.6*  MCV 79.7* 79.3* 77.6* 79.5* 80.7 83.9  PLT 210 183 175 190 218 120*   Basic Metabolic Panel: Recent Labs  Lab 07/31/23 0357 08/01/23 0907 08/02/23 0733 08/04/23 0001 08/05/23 0453  NA 134* 131* 134* 133* 135  K 4.8 4.1 4.5 4.4 4.5  CL 101 97* 95* 98 103  CO2 26 25 28 24 24   GLUCOSE 91 176* 96 157* 107*  BUN 14 19 19  23* 16  CREATININE 0.91 1.02 1.00 0.68 0.67  CALCIUM  8.8* 9.1 9.3 9.7 8.6*  MG 2.9* 2.4 3.2*  --  2.2  PHOS 3.0 3.3 3.2  --   --    GFR: Estimated Creatinine Clearance: 91.8 mL/min (by C-G formula based on SCr of 0.67 mg/dL). Liver Function Tests: Recent Labs  Lab 07/30/23 0315 07/31/23 0357 08/01/23 0907 08/02/23 0733 08/04/23 0001  AST 12* 10* 13* 13* 15  ALT 8 8 9 8 10   ALKPHOS 96 84 88 89 99  BILITOT 0.7 0.7 0.5 0.4 0.6  PROT 6.3* 6.1* 6.5 6.8 7.2  ALBUMIN 3.5 3.4* 3.6 3.6 4.0   Recent Labs  Lab 07/29/23 1416 08/04/23 0001  LIPASE 32 30   HbA1C: Recent Labs    08/05/23 0453  HGBA1C 5.7*   CBG: Recent Labs  Lab 08/04/23 1214 08/04/23 1634 08/04/23 1953  08/05/23 0059 08/05/23 0801  GLUCAP 107* 146* 188* 116* 106*    Radiology Studies: DG Abd 1 View Result Date: 08/04/2023 CLINICAL DATA:  [id number] Intractable nausea and vomiting [id number] EXAM: ABDOMEN - 1 VIEW COMPARISON:  Aug 02, 2023 FINDINGS: Gaseous distension of the small bowel with more pronounced gaseous distension of the colon. No visualized pneumoperitoneum. The upper abdomen was excluded from the field of view, limiting evaluation for organomegaly. No acute fracture or destructive lesion. IMPRESSION: Gaseous distension of the small bowel with more pronounced gaseous distension of the colon, suggesting an adynamic ileus. Electronically Signed   By: Rance Burrows M.D.   On: 08/04/2023 09:11    Scheduled Meds:  baclofen   5 mg Oral QHS   enoxaparin  (LOVENOX ) injection  40 mg Subcutaneous Q24H   insulin  aspart  0-6 Units Subcutaneous TID WC   latanoprost   1 drop Both Eyes QHS   metoprolol  succinate  12.5 mg Oral Daily   montelukast   10 mg Oral Daily   rosuvastatin   5 mg Oral QHS   timolol   1 drop Both Eyes BID   Continuous Infusions:  promethazine  (PHENERGAN ) injection (IM or IVPB)       LOS: 0 days   Time spent: 50 minutes  Unk Garb, DO  [facility]  08/05/2023, 11:29 AM

## 2023-08-05 NOTE — TOC Transition Note (Signed)
 Transition of Care [facility]) - Discharge Note   Patient Details  Name: Thomas Ramos MRN: [id number] Date of Birth: Jul 29, 1970  Transition of Care [facility]) CM/SW Contact:  Kathryn Parish, RN Phone Number: 08/05/2023, 2:29 PM   Clinical Narrative:    Patient being discharge to home with caregiver [name]. CM completed medical necessity, face sheet and called [facility] ([facility]). TOC signing off   Final next level of care: Home/Self Care Barriers to Discharge: Barriers Resolved   Patient Goals and CMS Choice Patient states their goals for this hospitalization and ongoing recovery are:: Home   Choice offered to / list presented to : NA      Discharge Placement                       Discharge Plan and Services Additional resources added to the After Visit Summary for     Discharge Planning Services: CM Consult Post Acute Care Choice: NA          DME Arranged: N/A DME Agency: NA       HH Arranged: NA HH Agency: NA        Social Drivers of Health (SDOH) Interventions SDOH Screenings   Food Insecurity: No Food Insecurity (08/04/2023)  Housing: Low Risk  (08/04/2023)  Transportation Needs: No Transportation Needs (08/04/2023)  Utilities: Not At Risk (08/04/2023)  Financial Resource Strain: Low Risk  (04/20/2023)   Received from [facility]  Physical Activity: Unknown (03/31/2022)   Received from [facility], [facility]  Social Connections: Socially Isolated (08/04/2023)  Stress: No Stress Concern Present (03/31/2022)   Received from [facility], [facility]  Tobacco Use: Medium Risk (08/04/2023)     Readmission Risk Interventions    07/15/2022    2:11 PM  Readmission Risk Prevention Plan  Post Dischage Appt Complete  Medication Screening Complete  Transportation Screening Complete

## 2023-08-05 NOTE — Discharge Summary (Signed)
 [facility] Phys[facility] Discharge Summary   Patient name: Thomas Ramos  Admit date:     08/03/2023  Discharge date: 08/05/2023  Attending Physician: Walton Guppy [[id number]]  Discharge Physician: Unk Garb   PCP: Jearlean Mince, PA-C  Admitted From: Home  Disposition:  Home  Recommendations for Outpatient Follow-up:  Follow up with PCP in 1-2 weeks  Home Health:No Equipment/Devices: None    Discharge Condition:Stable CODE STATUS:FULL Diet recommendation: Diabetic Fluid Restriction: None  Hospital Summary: HPI: Thomas Ramos is a 53 y.o. male with medical history significant for cerebral palsy with spastic quadriplegia, hypertension, type 2 diabetes mellitus, OSA, and chronic HFpEF who presents with nausea and vomiting.   Patient was discharged from the hospital on 08/02/2023 after management of fecal impaction with stercoral colitis.  Back at home, he continued to have some loose stools after the aggressive bowel regimen in the hospital, and also developed nausea with recurrent bouts of nonbloody vomiting.  He has not had any abdominal pain, fever, chills, or urinary symptoms associated with this.   ED Course: Upon arrival to the ED, patient is found to be afebrile and saturating well on room air with normal HR and stable BP.  Labs are most notable for elevated BUN to creatinine ratio, normal WBC, normal LFTs, and normal lipase.   Patient was treated with a liter of saline and 2 doses of Zofran  in the ED.  He continued to vomit and Hospitalists were asked to admit.  Significant Events: Admitted 08/03/2023 for intractable N/V   Admission Labs: Na 133, K 4.4, CO2 of 24, BUN 23, Scr 0.68, glu 157 Lipase 30 WBC 8.9, HgB 13.7, plt 218  Admission Imaging Studies: KUB Gaseous distension of the small bowel with more pronounced gaseous distension of the colon, suggesting an adynamic ileus.  Significant Labs:   Significant Imaging Studies:   Antibiotic  Therapy: Anti-infectives (From admission, onward)    None       Procedures:   Consultants:    Hospital Course by Problem: * Intractable nausea and vomiting 08-04-2023 resolved. Start solid food. If pt can eat and pass flatus, he can go home tomorrow.  08-05-2023 abd XR yesterday suggested adynamic colonic ileus but he had great BS yesterday. Repeat abd xrays today shows less gaseous distension of colon but still awaiting final read.  If pt tolerates lunch today without N/V, I think he can go home this afternoon. Pt states he has passed flatus multiple times last night and today.  Type 2 diabetes mellitus with complication, without long-term current use of insulin  (HCC) 08-04-2023 on SSI.  08-05-2023 stable.  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 Pt is AxOx4. Uses power wheelchair. [place] to caretaker Blanchie Bunkers   08-05-2023 stable. He will need transport to home via ambulance. He does not have his power wheelchair with him in the hospital.  Chronic idiopathic constipation 08-05-2023 pt to remain on daily or Bid laxative to prevent future occurrences of fecal impactions.  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  08-05-2023 stable.  Essential hypertension 08-04-2023 stable.  08-05-2023 stable.  OSA (obstructive sleep apnea) 08-04-2023 stable.  08-05-2023 stable. He does not use CPAP.  Chronic diastolic (congestive) heart failure (HCC) 08-04-2023 stable. Euvolemic.   08-05-2023 stable.    Discharge Diagnoses:  Principal Problem:   Intractable nausea and vomiting Active Problems:   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HCC)   Type 2 diabetes mellitus with complication, without long-term current use of insulin  (HCC)   Chronic  diastolic (congestive) heart failure (HCC)   OSA (obstructive sleep apnea)   Essential hypertension   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HCC)   Chronic idiopathic constipation   Discharge  Instructions  Discharge Instructions     Call MD for:  difficulty breathing, headache or visual disturbances   Complete by: As directed    Call MD for:  extreme fatigue   Complete by: As directed    Call MD for:  hives   Complete by: As directed    Call MD for:  persistant dizziness or light-headedness   Complete by: As directed    Call MD for:  persistant nausea and vomiting   Complete by: As directed    Call MD for:  redness, tenderness, or signs of infection (pain, swelling, redness, odor or green/yellow discharge around incision site)   Complete by: As directed    Call MD for:  severe uncontrolled pain   Complete by: As directed    Call MD for:  temperature >100.4   Complete by: As directed    Diet - low sodium heart healthy   Complete by: As directed    Diet Carb Modified   Complete by: As directed    Discharge instructions   Complete by: As directed    1. Follow up with your primary care provider in 1-2 weeks following discharge from hospital. 2. Take enough laxatives each day to prevent constipation. Aim to have 1-2 bowel movements per day.   Increase activity slowly   Complete by: As directed       Allergies as of 08/05/2023       Reactions   Penicillins Hives, Nausea And Vomiting, Other (See Comments)   Patient tolerated cefazolin  in 2017   Sulfamethoxazole -trimethoprim  Nausea And Vomiting   Metronidazole  Nausea And Vomiting        Medication List     STOP taking these medications    amLODipine  10 MG tablet Commonly known as: NORVASC    Semaglutide(0.25 or 0.5MG /DOS) 2 MG/3ML Sopn       TAKE these medications    acetaminophen  325 MG tablet Commonly known as: TYLENOL  Take 2 tablets (650 mg total) by mouth every 6 (six) hours as needed for mild pain (pain score 1-3), moderate pain (pain score 4-6) or fever (or Fever >/= 101).   baclofen  10 MG tablet Commonly known as: LIORESAL  Take 2.5 mg by mouth at bedtime.   ciprofloxacin  500 MG  tablet Commonly known as: CIPRO  Take 1 tablet (500 mg total) by mouth 2 (two) times daily for 5 days.   furosemide  20 MG tablet Commonly known as: LASIX  Take 1 tablet (20 mg total) by mouth daily as needed for fluid or edema. What changed:  when to take this reasons to take this   latanoprost  0.005 % ophthalmic solution Commonly known as: XALATAN  Place 1 drop into both eyes at bedtime.   metoprolol  succinate 25 MG 24 hr tablet Commonly known as: TOPROL -XL Take 0.5 tablets (12.5 mg total) by mouth daily.   metroNIDAZOLE  500 MG tablet Commonly known as: FLAGYL  Take 1 tablet (500 mg total) by mouth every 12 (twelve) hours for 5 days.   montelukast  10 MG tablet Commonly known as: SINGULAIR  Take 1 tablet by mouth at bedtime.   nitroGLYCERIN  0.4 MG SL tablet Commonly known as: NITROSTAT  Place 0.4 mg under the tongue every 5 (five) minutes as needed for chest pain.   omeprazole  20 MG capsule Commonly known as: PRILOSEC Take 1 capsule by mouth every morning.  ondansetron  4 MG disintegrating tablet Commonly known as: ZOFRAN -ODT Take 1 tablet (4 mg total) by mouth every 8 (eight) hours as needed for nausea or vomiting.   polyethylene glycol powder 17 GM/SCOOP powder Commonly known as: GLYCOLAX /MIRALAX  Take 34 g by mouth daily. What changed: how much to take   ProAir  RespiClick 108 (90 Base) MCG/ACT Aepb Generic drug: Albuterol  Sulfate Inhale 2 puffs into the lungs every 6 (six) hours as needed (Shortness of breath).   rosuvastatin  5 MG tablet Commonly known as: CRESTOR  Take 5 mg by mouth at bedtime.   senna-docusate 8.6-50 MG tablet Commonly known as: Senokot-S Take 2 tablets by mouth 2 (two) times daily. What changed:  how much to take when to take this   simethicone  80 MG chewable tablet Commonly known as: MYLICON Chew 1 tablet (80 mg total) by mouth 4 (four) times daily as needed for flatulence.   timolol  0.5 % ophthalmic solution Commonly known as:  TIMOPTIC  Place 1 drop into both eyes 2 (two) times daily.        Allergies  Allergen Reactions   Penicillins Hives, Nausea And Vomiting and Other (See Comments)    Patient tolerated cefazolin  in 2017   Sulfamethoxazole -Trimethoprim  Nausea And Vomiting   Metronidazole  Nausea And Vomiting    Discharge Exam: Vitals:   08/05/23 0432 08/05/23 1327  BP: 105/68 116/71  Pulse: (!) 54 67  Resp: 18 18  Temp: 98.5 F (36.9 C) (!) 97.5 F (36.4 C)  SpO2: 100% 100%    Physical Exam Vitals and nursing note reviewed.  Constitutional:      Appearance: He is not toxic-appearing.  HENT:     Head: Normocephalic and atraumatic.  Cardiovascular:     Rate and Rhythm: Normal rate and regular rhythm.  Pulmonary:     Effort: Pulmonary effort is normal.     Breath sounds: Normal breath sounds.  Abdominal:     General: Bowel sounds are normal. There is no distension.     Palpations: Abdomen is soft.     Tenderness: There is no abdominal tenderness.  Skin:    General: Skin is warm and dry.  Neurological:     Mental Status: He is alert and oriented to person, place, and time.     The results of significant diagnostics from this hospitalization (including imaging, microbiology, ancillary and laboratory) are listed below for reference.     Labs:  Basic Metabolic Panel: Recent Labs  Lab 07/31/23 0357 08/01/23 0907 08/02/23 0733 08/04/23 0001 08/05/23 0453  NA 134* 131* 134* 133* 135  K 4.8 4.1 4.5 4.4 4.5  CL 101 97* 95* 98 103  CO2 26 25 28 24 24   GLUCOSE 91 176* 96 157* 107*  BUN 14 19 19  23* 16  CREATININE 0.91 1.02 1.00 0.68 0.67  CALCIUM  8.8* 9.1 9.3 9.7 8.6*  MG 2.9* 2.4 3.2*  --  2.2  PHOS 3.0 3.3 3.2  --   --    Liver Function Tests: Recent Labs  Lab 07/30/23 0315 07/31/23 0357 08/01/23 0907 08/02/23 0733 08/04/23 0001  AST 12* 10* 13* 13* 15  ALT 8 8 9 8 10   ALKPHOS 96 84 88 89 99  BILITOT 0.7 0.7 0.5 0.4 0.6  PROT 6.3* 6.1* 6.5 6.8 7.2  ALBUMIN 3.5 3.4*  3.6 3.6 4.0   Recent Labs  Lab 07/29/23 1416 08/04/23 0001  LIPASE 32 30   CBC: Recent Labs  Lab 07/30/23 0315 07/31/23 0357 08/01/23 0907 08/02/23 0733 08/04/23 0001  08/05/23 0453  WBC 5.8 5.3 5.3 5.0 8.9 3.5*  NEUTROABS 3.9 3.0 3.6 3.1 7.3  --   HGB 12.4* 12.3* 12.7* 13.5 13.7 8.6*  HCT 38.0* 37.5* 38.2* 41.1 41.7 27.6*  MCV 79.7* 79.3* 77.6* 79.5* 80.7 83.9  PLT 210 183 175 190 218 120*   CBG: Recent Labs  Lab 08/04/23 1634 08/04/23 1953 08/05/23 0059 08/05/23 0801 08/05/23 1134  GLUCAP 146* 188* 116* 106* 99   Hgb A1c Recent Labs    08/05/23 0453  HGBA1C 5.7*   Urinalysis    Component Value Date/Time   COLORURINE YELLOW 07/29/2023 1705   APPEARANCEUR CLEAR 07/29/2023 1705   LABSPEC 1.010 07/29/2023 1705   PHURINE 5.0 07/29/2023 1705   GLUCOSEU NEGATIVE 07/29/2023 1705   HGBUR NEGATIVE 07/29/2023 1705   BILIRUBINUR NEGATIVE 07/29/2023 1705   KETONESUR NEGATIVE 07/29/2023 1705   PROTEINUR NEGATIVE 07/29/2023 1705   UROBILINOGEN 1.0 05/26/2014 2211   NITRITE NEGATIVE 07/29/2023 1705   LEUKOCYTESUR NEGATIVE 07/29/2023 1705   Sepsis Labs Recent Labs  Lab 08/01/23 0907 08/02/23 0733 08/04/23 0001 08/05/23 0453  WBC 5.3 5.0 8.9 3.5*    Procedures/Studies: DG Abd 1 View Result Date: 08/05/2023 CLINICAL DATA:  Abdominal distension EXAM: ABDOMEN - 1 VIEW COMPARISON:  Abdominal radiograph dated 08/04/2023 FINDINGS: Similar diffuse gas-filled bowel loops throughout the abdomen. Nonobstructive bowel gas pattern. Upper abdomen is not included within the field of view. Large volume stool within the ascending colon. Small volume stool projecting over the rectum. No abnormal radio-opaque calculi or mass effect. Similar left hip dysplasia. No acute dislocation. Partially imaged lung bases are clear. IMPRESSION: Nonobstructive bowel gas pattern. Similar diffuse gas-filled bowel loops throughout the abdomen. Large volume stool within the ascending colon.  Electronically Signed   By: Limin  Xu M.D.   On: 08/05/2023 12:23   DG Abd 1 View Result Date: 08/04/2023 CLINICAL DATA:  [id number] Intractable nausea and vomiting [id number] EXAM: ABDOMEN - 1 VIEW COMPARISON:  Aug 02, 2023 FINDINGS: Gaseous distension of the small bowel with more pronounced gaseous distension of the colon. No visualized pneumoperitoneum. The upper abdomen was excluded from the field of view, limiting evaluation for organomegaly. No acute fracture or destructive lesion. IMPRESSION: Gaseous distension of the small bowel with more pronounced gaseous distension of the colon, suggesting an adynamic ileus. Electronically Signed   By: Rance Burrows M.D.   On: 08/04/2023 09:11   DG Abd 1 View Result Date: 08/02/2023 CLINICAL DATA:  Constipation. EXAM: ABDOMEN - 1 VIEW COMPARISON:  Abdominal radiograph dated 08/01/2023. FINDINGS: Mild colonic stool burden. No bowel dilatation. Air is noted in the colon. Osteopenia with degenerative changes of the spine. Chronic bilateral hip deformity. No acute osseous pathology. IMPRESSION: Mild colonic stool burden. No bowel dilatation. Electronically Signed   By: Angus Bark M.D.   On: 08/02/2023 09:52   DG Abd 1 View Result Date: 08/01/2023 CLINICAL DATA:  Constipation. EXAM: ABDOMEN - 1 VIEW COMPARISON:  07/31/2023 FINDINGS: Diffuse gaseous distention of small bowel and colon is similar to yesterday's study. Rectal stool volume appears decreased in the interval. Bones are diffusely demineralized with chronic deformity in the left hip. IMPRESSION: Persistent diffuse gaseous distention of small bowel and colon. Rectal stool volume appears decreased in the interval. Electronically Signed   By: Donnal Fusi M.D.   On: 08/01/2023 09:37   DG Abd Portable 1V Result Date: 07/31/2023 CLINICAL DATA:  Constipation. EXAM: PORTABLE ABDOMEN - 1 VIEW COMPARISON:  May 27, 2014. FINDINGS: No abnormal bowel  dilatation. Large amount of stool seen in the rectum concerning  for impaction. No radio-opaque calculi or other significant radiographic abnormality are seen. IMPRESSION: Large amount of stool seen in rectum concerning for impaction. No abnormal bowel dilatation. Electronically Signed   By: Rosalene Colon M.D.   On: 07/31/2023 10:40   CT ABDOMEN PELVIS W CONTRAST Result Date: 07/29/2023 CLINICAL DATA:  Abdominal pain for the past week with intermittent nausea and vomiting. Cerebral palsy. EXAM: CT ABDOMEN AND PELVIS WITH CONTRAST TECHNIQUE: Multidetector CT imaging of the abdomen and pelvis was performed using the standard protocol following bolus administration of intravenous contrast. RADIATION DOSE REDUCTION: This exam was performed according to the departmental dose-optimization program which includes automated exposure control, adjustment of the mA and/or kV according to patient size and/or use of iterative reconstruction technique. CONTRAST:  75mL OMNIPAQUE  IOHEXOL  350 MG/ML SOLN COMPARISON:  02/05/2017 FINDINGS: Lower chest: Borderline enlarged heart. Stable large hiatal hernia. Clear lung bases. Hepatobiliary: No focal liver abnormality is seen. No gallstones, gallbladder wall thickening, or biliary dilatation. Pancreas: Unremarkable. No pancreatic ductal dilatation or surrounding inflammatory changes. Spleen: Normal in size without focal abnormality. Adrenals/Urinary Tract: Normal-appearing adrenal glands. Simple appearing right renal cysts, not requiring imaging follow-up. Unremarkable left kidney and ureters. The bladder is displaced anteriorly by a large area of fecal impaction in the rectum. Mild diffuse bladder wall thickening. Stomach/Bowel: Colonic redundancy with prominent stool throughout the colon, most pronounced in the rectum and distal sigmoid colon. These are both markedly dilated with mild surrounding wall thickening. Unremarkable small bowel. Large hiatal hernia. The appendix is not definitely identified. Vascular/Lymphatic: No significant vascular  findings are present. No enlarged abdominal or pelvic lymph nodes. Reproductive: Mildly enlarged prostate gland. Other: Moderate-sized right inguinal hernia containing fat. Musculoskeletal: Moderate thoracolumbar and lower thoracic spine degenerative changes. Stable left hip dysplastic changes. IMPRESSION: 1. Prominent stool throughout the colon, most pronounced in the rectum and distal sigmoid colon. These are both markedly dilated with mild surrounding wall thickening. This is consistent with fecal impaction and stercoral colitis. 2. Mild diffuse bladder wall thickening, likely due to chronic bladder outlet obstruction from the mildly enlarged prostate gland or chronically dilated, impacted rectum. Cystitis is also a possibility. 3. Stable large hiatal hernia. 4. Moderate-sized right inguinal hernia containing fat. Electronically Signed   By: Catherin Closs M.D.   On: 07/29/2023 18:23    Time coordinating discharge: 55 mins  SIGNED:  Unk Garb, DO [facility] 08/05/23, 1:40 PM

## 2023-08-05 NOTE — Plan of Care (Signed)
   Problem: Clinical Measurements: Goal: Ability to maintain clinical measurements within normal limits will improve Outcome: Progressing Goal: Will remain free from infection Outcome: Progressing Goal: Diagnostic test results will improve Outcome: Progressing Goal: Respiratory complications will improve Outcome: Progressing

## 2023-08-10 ENCOUNTER — Telehealth: Payer: Self-pay | Admitting: Internal Medicine

## 2023-08-10 NOTE — Telephone Encounter (Signed)
 Called Katie at [employer] back about message. Informed her that patient should be taking metoprolol  succinate 12.5 mg (1/2 tablet) by mouth daily. Katie verbalized understanding.

## 2023-08-10 NOTE — Telephone Encounter (Signed)
 Pt c/o medication issue:  1. Name of Medication:   metoprolol  succinate (TOPROL -XL) 25 MG 24 hr tablet   2. How are you currently taking this medication (dosage and times per day)?   3. Are you having a reaction (difficulty breathing--STAT)?   4. What is your medication issue?   Caller Industrial/product designer) wants a call back to clarify patient's dosage for this medication.

## 2023-09-02 ENCOUNTER — Other Ambulatory Visit: Payer: Self-pay

## 2023-09-02 ENCOUNTER — Emergency Department (HOSPITAL_COMMUNITY)

## 2023-09-02 ENCOUNTER — Encounter (HOSPITAL_COMMUNITY): Payer: Self-pay | Admitting: Emergency Medicine

## 2023-09-02 ENCOUNTER — Emergency Department (HOSPITAL_COMMUNITY)
Admission: EM | Admit: 2023-09-02 | Discharge: 2023-09-03 | Disposition: A | Discharge status: Home / Self Care | Attending: Emergency Medicine | Admitting: Emergency Medicine

## 2023-09-02 DIAGNOSIS — E119 Type 2 diabetes mellitus without complications: Secondary | ICD-10-CM | POA: Diagnosis not present

## 2023-09-02 DIAGNOSIS — R5381 Other malaise: Secondary | ICD-10-CM | POA: Insufficient documentation

## 2023-09-02 DIAGNOSIS — R350 Frequency of micturition: Secondary | ICD-10-CM | POA: Insufficient documentation

## 2023-09-02 DIAGNOSIS — I509 Heart failure, unspecified: Secondary | ICD-10-CM | POA: Diagnosis not present

## 2023-09-02 LAB — CBC WITH DIFFERENTIAL/PLATELET
Abs Immature Granulocytes: 0.01 10*3/uL (ref 0.00–0.07)
Basophils Absolute: 0 10*3/uL (ref 0.0–0.1)
Basophils Relative: 1 %
Eosinophils Absolute: 0.1 10*3/uL (ref 0.0–0.5)
Eosinophils Relative: 2 %
HCT: 42.8 % (ref 39.0–52.0)
Hemoglobin: 13.7 g/dL (ref 13.0–17.0)
Immature Granulocytes: 0 %
Lymphocytes Relative: 27 %
Lymphs Abs: 1.4 10*3/uL (ref 0.7–4.0)
MCH: 25.8 pg — ABNORMAL LOW (ref 26.0–34.0)
MCHC: 32 g/dL (ref 30.0–36.0)
MCV: 80.6 fL (ref 80.0–100.0)
Monocytes Absolute: 0.4 10*3/uL (ref 0.1–1.0)
Monocytes Relative: 7 %
Neutro Abs: 3.3 10*3/uL (ref 1.7–7.7)
Neutrophils Relative %: 63 %
Platelets: 208 10*3/uL (ref 150–400)
RBC: 5.31 MIL/uL (ref 4.22–5.81)
RDW: 13.2 % (ref 11.5–15.5)
WBC: 5.2 10*3/uL (ref 4.0–10.5)
nRBC: 0 % (ref 0.0–0.2)

## 2023-09-02 LAB — URINALYSIS, W/ REFLEX TO CULTURE (INFECTION SUSPECTED)
Bacteria, UA: NONE SEEN
Glucose, UA: NEGATIVE mg/dL
Hgb urine dipstick: NEGATIVE
Ketones, ur: NEGATIVE mg/dL
Leukocytes,Ua: NEGATIVE
Nitrite: NEGATIVE
Protein, ur: NEGATIVE mg/dL
Specific Gravity, Urine: 1.027 (ref 1.005–1.030)
pH: 5 (ref 5.0–8.0)

## 2023-09-02 LAB — COMPREHENSIVE METABOLIC PANEL WITH GFR
ALT: 10 U/L (ref 0–44)
AST: 14 U/L — ABNORMAL LOW (ref 15–41)
Albumin: 4.2 g/dL (ref 3.5–5.0)
Alkaline Phosphatase: 90 U/L (ref 38–126)
Anion gap: 9 (ref 5–15)
BUN: 15 mg/dL (ref 6–20)
CO2: 26 mmol/L (ref 22–32)
Calcium: 9.5 mg/dL (ref 8.9–10.3)
Chloride: 102 mmol/L (ref 98–111)
Creatinine, Ser: 0.92 mg/dL (ref 0.61–1.24)
GFR, Estimated: >60 mL/min (ref >60)
Glucose, Bld: 103 mg/dL — ABNORMAL HIGH (ref 70–99)
Potassium: 3.4 mmol/L — ABNORMAL LOW (ref 3.5–5.1)
Sodium: 137 mmol/L (ref 135–145)
Total Bilirubin: 0.9 mg/dL (ref 0.0–1.2)
Total Protein: 7.3 g/dL (ref 6.5–8.1)

## 2023-09-02 LAB — LIPASE, BLOOD: Lipase: 27 U/L (ref 11–51)

## 2023-09-02 NOTE — ED Provider Notes (Signed)
 [place] EMERGENCY DEPARTMENT AT [facility] Provider Note   CSN: [id number] Arrival date & time: 09/02/23  1711     Patient presents with: No chief complaint on file.   Thomas Ramos is a 53 y.o. male.   The history is provided by the patient and medical records. No language interpreter was used.  Illness Location:  Malaisea and fatigue, freqnency Severity:  Moderate Onset quality:  Gradual Duration:  2 days Timing:  Constant Progression:  Waxing and waning Chronicity:  New Associated symptoms: fatigue   Associated symptoms: no abdominal pain, no chest pain, no congestion, no cough, no diarrhea, no fever, no headaches, no loss of consciousness, no nausea, no rash, no rhinorrhea, no shortness of breath, no vomiting and no wheezing   Fatigue:    Severity:  Moderate   Timing:  Constant   Progression:  Waxing and waning      Prior to Admission medications   Medication Sig Start Date End Date Taking? Authorizing Provider  acetaminophen  (TYLENOL ) 325 MG tablet Take 2 tablets (650 mg total) by mouth every 6 (six) hours as needed for mild pain (pain score 1-3), moderate pain (pain score 4-6) or fever (or Fever >/= 101). 04/23/23   Hongalgi, Thomasene Flemings, MD  Albuterol  Sulfate (PROAIR  RESPICLICK) 108 (90 Base) MCG/ACT AEPB Inhale 2 puffs into the lungs every 6 (six) hours as needed (Shortness of breath). 05/03/20   [provider]  baclofen  (LIORESAL ) 10 MG tablet Take 2.5 mg by mouth at bedtime. 06/01/18   [provider]  furosemide  (LASIX ) 20 MG tablet Take 1 tablet (20 mg total) by mouth daily as needed for fluid or edema. 08/04/23   Unk Garb, DO  latanoprost  (XALATAN ) 0.005 % ophthalmic solution Place 1 drop into both eyes at bedtime. 08/25/21   [provider]  metoprolol  succinate (TOPROL -XL) 25 MG 24 hr tablet Take 0.5 tablets (12.5 mg total) by mouth daily. 07/01/23   Meng, Hao, PA  montelukast  (SINGULAIR ) 10 MG tablet Take 1 tablet by mouth at  bedtime. 07/28/21   [provider]  nitroGLYCERIN  (NITROSTAT ) 0.4 MG SL tablet Place 0.4 mg under the tongue every 5 (five) minutes as needed for chest pain.    [provider]  omeprazole  (PRILOSEC) 20 MG capsule Take 1 capsule by mouth every morning. 12/12/19   [provider]  ondansetron  (ZOFRAN -ODT) 4 MG disintegrating tablet Take 1 tablet (4 mg total) by mouth every 8 (eight) hours as needed for nausea or vomiting. 08/02/23   Sheikh, Omair Latif, DO  polyethylene glycol powder (GLYCOLAX /MIRALAX ) 17 GM/SCOOP powder Take 34 g by mouth daily. 08/05/23   Unk Garb, DO  rosuvastatin  (CRESTOR ) 5 MG tablet Take 5 mg by mouth at bedtime.    [provider]  senna-docusate (SENOKOT-S) 8.6-50 MG tablet Take 2 tablets by mouth 2 (two) times daily. 08/05/23 09/04/23  Unk Garb, DO  simethicone  (MYLICON) 80 MG chewable tablet Chew 1 tablet (80 mg total) by mouth 4 (four) times daily as needed for flatulence. 08/02/23   Aura Leeds Latif, DO  timolol  (TIMOPTIC ) 0.5 % ophthalmic solution Place 1 drop into both eyes 2 (two) times daily. 04/10/20   [provider]    Allergies: Penicillins, Sulfamethoxazole -trimethoprim , and Metronidazole     Review of Systems  Constitutional:  Positive for fatigue. Negative for chills and fever.  HENT:  Negative for congestion and rhinorrhea.   Respiratory:  Negative for cough, chest tightness, shortness of breath and wheezing.  Cardiovascular:  Negative for chest pain and palpitations.  Gastrointestinal:  Negative for abdominal distention, abdominal pain, constipation, diarrhea, nausea and vomiting.  Genitourinary:  Positive for frequency. Negative for dysuria and flank pain.  Musculoskeletal:  Negative for back pain, neck pain and neck stiffness.  Skin:  Negative for rash and wound.  Neurological:  Negative for loss of consciousness, light-headedness and headaches.  Psychiatric/Behavioral:  Negative for agitation and  confusion.   All other systems reviewed and are negative.   Updated Vital Signs BP 121/75   Pulse 60   Temp 98.3 F (36.8 C)   Resp (!) 22   SpO2 99%   Physical Exam Vitals and nursing note reviewed.  Constitutional:      General: He is not in acute distress.    Appearance: He is well-developed. He is not ill-appearing, toxic-appearing or diaphoretic.  HENT:     Head: Normocephalic and atraumatic.     Nose: No congestion or rhinorrhea.     Mouth/Throat:     Mouth: Mucous membranes are moist.     Pharynx: No oropharyngeal exudate or posterior oropharyngeal erythema.   Eyes:     Extraocular Movements: Extraocular movements intact.     Conjunctiva/sclera: Conjunctivae normal.     Pupils: Pupils are equal, round, and reactive to light.    Cardiovascular:     Rate and Rhythm: Normal rate and regular rhythm.     Pulses: Normal pulses.     Heart sounds: No murmur heard. Pulmonary:     Effort: Pulmonary effort is normal. No respiratory distress.     Breath sounds: Normal breath sounds. No wheezing, rhonchi or rales.  Chest:     Chest wall: No tenderness.  Abdominal:     Palpations: Abdomen is soft.     Tenderness: There is no abdominal tenderness. There is no right CVA tenderness, left CVA tenderness, guarding or rebound.   Musculoskeletal:        General: No swelling or tenderness.     Cervical back: Neck supple.   Skin:    General: Skin is warm and dry.     Capillary Refill: Capillary refill takes less than 2 seconds.     Findings: No erythema.   Neurological:     Mental Status: He is alert. Mental status is at baseline.   Psychiatric:        Mood and Affect: Mood normal.     (all labs ordered are listed, but only abnormal results are displayed) Labs Reviewed  CBC WITH DIFFERENTIAL/PLATELET - Abnormal; Notable for the following components:      Result Value   MCH 25.8 (*)    All other components within normal limits  COMPREHENSIVE METABOLIC PANEL WITH GFR -  Abnormal; Notable for the following components:   Potassium 3.4 (*)    Glucose, Bld 103 (*)    AST 14 (*)    All other components within normal limits  URINALYSIS, W/ REFLEX TO CULTURE (INFECTION SUSPECTED) - Abnormal; Notable for the following components:   Color, Urine AMBER (*)    APPearance HAZY (*)    Bilirubin Urine SMALL (*)    All other components within normal limits  URINE CULTURE  LIPASE, BLOOD    EKG: EKG Interpretation Date/Time:  Thursday September 02 2023 17:22:40 EDT Ventricular Rate:  78 PR Interval:  149 QRS Duration:  90 QT Interval:  375 QTC Calculation: 428 R Axis:   93  Text Interpretation: Sinus rhythm Borderline right axis deviation when compared  to prior, similar appearance No STEMI Confirmed by Wynell Heath ([id number]) on 09/02/2023 5:34:43 PM  Radiology: DG Chest Portable 1 View Result Date: 09/02/2023 CLINICAL DATA:  Weakness and fatigue EXAM: PORTABLE CHEST 1 VIEW COMPARISON:  07/13/2022 FINDINGS: Rotated patient. Low lung volumes. No acute airspace disease, pleural effusion or pneumothorax. Stable cardiomediastinal silhouette IMPRESSION: Low lung volumes. Electronically Signed   By: Esmeralda Hedge M.D.   On: 09/02/2023 18:41     Procedures   Medications Ordered in the ED - No data to display                                  Medical Decision Making Amount and/or Complexity of Data Reviewed Labs: ordered. Radiology: ordered.    Thomas Ramos is a 53 y.o. 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  According to patient, for the last day or 2 he has been having urinary frequency and today was feeling worse with general malaise and fatigue.  He feels yucky and was told to come the emergency department for evaluation.  He reports no chest pain or  abdominal pain but he said with EMS he did have some abdominal tenderness that has resolved.  He reports no constipation or diarrhea at this time and has had no nausea or vomiting.  He denies any trauma.  He denies any extremity pains and denies any new swelling.  Denies rashes.  Denies any congestion, cough, fevers, or chills.  He is worried he could have infection somewhere.  On exam, lungs were clear.  Chest nontender.  Abdomen nontender.  Bowel sounds appreciated.  Patient has weakness in both legs that he reports is unchanged from baseline and he had contractures in arms but could move them.  Patient otherwise resting and his vital signs are reassuring on arrival.  Given the patient's chronic medical issues and this fatigue and malaise with some urinary symptoms, will get screening labs and will look for occult infection.  Will get urinalysis and chest x-ray.  Will get screening labs for significant lecture light disturbance given his previous nausea and vomiting issues.  Anticipate reassessment after workup to determine disposition.  Symptoms may be viral.  Patient workup overall reassuring.  No clear evidence of UTI.  Electrolytes not critically abnormal.  Chest x-ray did not show pneumonia.  Patient resting with reassuring vital signs.  Will discharge for outpatient follow-up and suspected to have viral infection.  He agreed with plan of care and will be discharged for outpatient follow-up.      Final diagnoses:  Malaise  Urinary frequency    ED Discharge Orders     None       Clinical Impression: 1. Malaise   2. Urinary frequency     Disposition: Discharge  Condition: Good  I have discussed the results, Dx and Tx plan with the pt(& family if present). He/she/they expressed understanding and agree(s) with the plan. Discharge instructions discussed at great length. Strict return precautions discussed and pt &/or family have verbalized understanding of the instructions. No  further questions at time of discharge.    New Prescriptions   No medications on file    Follow Up: Jearlean Mince, PA-C [address] [place] [address] [phone]  [facility] Emergency Department at [facility] [address] [place]  [address] [phone]       Mahesh Sizemore, Marine Sia, MD 09/02/23 2312

## 2023-09-02 NOTE — ED Triage Notes (Signed)
 Pt BIB EMS from home, c/o generalized weakness and overall not feeling well. Called MD who recommended ER visit. Hx cerebral palsy.   BP 141/87 P 87 RR 18 SpO2 97% CBG 122

## 2023-09-02 NOTE — Discharge Instructions (Signed)
 Your history, exam, and workup today was overall reassuring.  Your urinalysis did not show evidence of urinary tract infection despite the urinary frequency you been having.  With feeling okay we look for other occult or hidden infection or other electrolyte or metabolic disturbance and your workup was overall reassuring.  We feel you are safe for discharge home and suspect you may have a mild viral infection making you feel bad all over.  Please follow-up with your primary doctor and rest and stay hydrated.  If any symptoms change or worsen acutely, please return to the nearest emergency department.

## 2023-09-02 NOTE — ED Notes (Signed)
 Pt provided with a ham sandwich and coke.

## 2023-09-02 NOTE — ED Notes (Signed)
[facility] transportation set up

## 2023-09-03 LAB — URINE CULTURE: Culture: NO GROWTH

## 2023-09-22 ENCOUNTER — Ambulatory Visit: Admitting: Physician Assistant

## 2023-10-14 ENCOUNTER — Encounter: Payer: Self-pay | Admitting: Gastroenterology

## 2023-10-14 ENCOUNTER — Ambulatory Visit (INDEPENDENT_AMBULATORY_CARE_PROVIDER_SITE_OTHER): Admitting: Gastroenterology

## 2023-10-14 VITALS — BP 130/80 | HR 123

## 2023-10-14 DIAGNOSIS — K5641 Fecal impaction: Secondary | ICD-10-CM

## 2023-10-14 DIAGNOSIS — K5909 Other constipation: Secondary | ICD-10-CM

## 2023-10-14 DIAGNOSIS — R933 Abnormal findings on diagnostic imaging of other parts of digestive tract: Secondary | ICD-10-CM | POA: Diagnosis not present

## 2023-10-14 NOTE — Progress Notes (Signed)
 [facility] Progress Note  Chief Complaint: Chronic constipation and abnormal imaging study  Summary of GI history:  DM, CP with spasticity-wheelchair-bound, large hiatal hernia with esophageal tortuosity causing intermittent dysphagia Chronic constipation (outlined in multiple prior notes)  Subjective  HPI: Thomas Ramos is here to see us  for follow-up after recent hospitalizations for fecal impaction and nausea and vomiting related to that. Extensive records related to that hospitalization were reviewed __________________  Marcey says that he has a BM every day and that he has not been taking MiraLAX .  As he has told me multiple times before, his concern is that if he takes it he may have unexpected results and he has to work every weekday.  So it becomes problematic for him to have unpredictability and loss of control because it is hard to get to a bathroom given his mobility issues. He denies rectal bleeding or abdominal pain.  He says his appetite is good and he is able to eat without difficulty most days. He expressed frustration and concerns that his providers in the hospital wanted to treat him with what sounds like enemas and possible disimpaction and he really did not wish that to happen.  ROS: Cardiovascular:  no chest pain Respiratory: no dyspnea  The patient's Past Medical, Family and Social History were reviewed and are on file in the EMR. Past Medical History:  Diagnosis Date   Anxiety    Asthma    Cellulitis 08/04/2017   Cerebral palsy (HCC)    Chronic diastolic (congestive) heart failure (HCC)    CP (cerebral palsy) (HCC)    Diabetes mellitus without complication (HCC)    borderline   Drug induced constipation    Environmental allergies    takes inhalers if needed   Esophageal stricture    GERD (gastroesophageal reflux disease)    Hypertension    Intractable nausea and vomiting 08/04/2023   Motility disorder, esophageal    Pneumonia    Quadriplegic spinal  paralysis (HCC)    S/P Botox  injection    approx every 4 months   Seasonal allergies     Past Surgical History:  Procedure Laterality Date   BIOPSY  06/24/2021   Procedure: BIOPSY;  Surgeon: Legrand Victory LITTIE DOUGLAS, MD;  Location: [facility];  Service: Gastroenterology;;   BOTOX  INJECTION N/A 12/21/2012   Procedure: BOTOX  INJECTION;  Surgeon: Lamar JONETTA Aho, MD;  Location: [facility];  Service: Endoscopy;  Laterality: N/A;   BOTOX  INJECTION N/A 06/28/2014   Procedure: BOTOX  INJECTION;  Surgeon: Lamar JONETTA Aho, MD;  Location: [facility] END[facility]COPY;  Service: Endoscopy;  Laterality: N/A;   COLONOSCOPY WITH PROPOFOL  N/A 06/24/2021   Procedure: COLONOSCOPY WITH PROPOFOL ;  Surgeon: Legrand Victory LITTIE DOUGLAS, MD;  Location: [facility];  Service: Gastroenterology;  Laterality: N/A;   ESOPHAGOGASTRODUODENOSCOPY N/A 12/21/2012   Procedure: ESOPHAGOGASTRODUODENOSCOPY (EGD);  Surgeon: Lamar JONETTA Aho, MD;  Location: [facility];  Service: Endoscopy;  Laterality: N/A;   ESOPHAGOGASTRODUODENOSCOPY N/A 06/28/2014   Procedure: ESOPHAGOGASTRODUODENOSCOPY (EGD);  Surgeon: Lamar JONETTA Aho, MD;  Location: [facility] END[facility]COPY;  Service: Endoscopy;  Laterality: N/A;   ESOPHAGOGASTRODUODENOSCOPY N/A 06/24/2021   Procedure: ESOPHAGOGASTRODUODENOSCOPY (EGD);  Surgeon: Legrand Victory LITTIE DOUGLAS, MD;  Location: [facility];  Service: Gastroenterology;  Laterality: N/A;   ESOPHAGOGASTRODUODENOSCOPY (EGD) WITH PROPOFOL  N/A 09/03/2017   Procedure: ESOPHAGOGASTRODUODENOSCOPY (EGD) WITH PROPOFOL ;  Surgeon: Legrand Victory LITTIE DOUGLAS, MD;  Location: [facility];  Service: Gastroenterology;  Laterality: N/A;   ESOPHAGUS SURGERY     stretched esophagus  legs     MOUTH SURGERY     POLYPECTOMY  06/24/2021   Procedure: POLYPECTOMY;  Surgeon: Legrand Victory LITTIE DOUGLAS, MD;  Location: [facility];  Service: Gastroenterology;;   TENDON RELEASE      Objective:  Med list reviewed  Current Outpatient Medications:    Albuterol  Sulfate (PROAIR  RESPICLICK) 108 (90  Base) MCG/ACT AEPB, Inhale 2 puffs into the lungs every 6 (six) hours as needed (Shortness of breath)., Disp: , Rfl:    baclofen  (LIORESAL ) 10 MG tablet, Take 2.5 mg by mouth at bedtime., Disp: , Rfl:    latanoprost  (XALATAN ) 0.005 % ophthalmic solution, Place 1 drop into both eyes at bedtime., Disp: , Rfl:    metoprolol  succinate (TOPROL -XL) 25 MG 24 hr tablet, Take 0.5 tablets (12.5 mg total) by mouth daily., Disp: 45 tablet, Rfl: 3   montelukast  (SINGULAIR ) 10 MG tablet, Take 1 tablet by mouth at bedtime., Disp: , Rfl:    nitroGLYCERIN  (NITROSTAT ) 0.4 MG SL tablet, Place 0.4 mg under the tongue every 5 (five) minutes as needed for chest pain., Disp: , Rfl:    omeprazole  (PRILOSEC) 20 MG capsule, Take 1 capsule by mouth every morning., Disp: , Rfl:    ondansetron  (ZOFRAN -ODT) 4 MG disintegrating tablet, Take 1 tablet (4 mg total) by mouth every 8 (eight) hours as needed for nausea or vomiting., Disp: 20 tablet, Rfl: 0   rosuvastatin  (CRESTOR ) 5 MG tablet, Take 5 mg by mouth at bedtime., Disp: , Rfl:    timolol  (TIMOPTIC ) 0.5 % ophthalmic solution, Place 1 drop into both eyes 2 (two) times daily., Disp: , Rfl:    acetaminophen  (TYLENOL ) 325 MG tablet, Take 2 tablets (650 mg total) by mouth every 6 (six) hours as needed for mild pain (pain score 1-3), moderate pain (pain score 4-6) or fever (or Fever >/= 101)., Disp: , Rfl:    furosemide  (LASIX ) 20 MG tablet, Take 1 tablet (20 mg total) by mouth daily as needed for fluid or edema., Disp: , Rfl:    polyethylene glycol powder (GLYCOLAX /MIRALAX ) 17 GM/SCOOP powder, Take 34 g by mouth daily. (Patient not taking: Reported on 10/14/2023), Disp: 850 g, Rfl: 0   simethicone  (MYLICON) 80 MG chewable tablet, Chew 1 tablet (80 mg total) by mouth 4 (four) times daily as needed for flatulence., Disp: 30 tablet, Rfl: 0   Vital signs in last 24 hrs: Vitals:   10/14/23 1448  BP: 130/80  Pulse: (!) 123   Wt Readings from Last 3 Encounters:  08/04/23 130 lb 15.3  oz (59.4 kg)  06/01/23 200 lb (90.7 kg)  05/31/23 200 lb (90.7 kg)    Physical Exam  Well-appearing Wheelchair-bound Abdomen soft and nondistended (limited exam) Labs:   ___________________________________________ Radiologic studies: CLINICAL DATA:  Abdominal pain for the past week with intermittent nausea and vomiting. Cerebral palsy.   EXAM: CT ABDOMEN AND PELVIS WITH CONTRAST   TECHNIQUE: Multidetector CT imaging of the abdomen and pelvis was performed using the standard protocol following bolus administration of intravenous contrast.   RADIATION DOSE REDUCTION: This exam was performed according to the departmental dose-optimization program which includes automated exposure control, adjustment of the mA and/or kV according to patient size and/or use of iterative reconstruction technique.   CONTRAST:  75mL OMNIPAQUE  IOHEXOL  350 MG/ML SOLN   COMPARISON:  02/05/2017   FINDINGS: Lower chest: Borderline enlarged heart. Stable large hiatal hernia. Clear lung bases.   Hepatobiliary: No focal liver abnormality is seen. No gallstones, gallbladder wall thickening, or biliary  dilatation.   Pancreas: Unremarkable. No pancreatic ductal dilatation or surrounding inflammatory changes.   Spleen: Normal in size without focal abnormality.   Adrenals/Urinary Tract: Normal-appearing adrenal glands. Simple appearing right renal cysts, not requiring imaging follow-up. Unremarkable left kidney and ureters. The bladder is displaced anteriorly by a large area of fecal impaction in the rectum. Mild diffuse bladder wall thickening.   Stomach/Bowel: Colonic redundancy with prominent stool throughout the colon, most pronounced in the rectum and distal sigmoid colon. These are both markedly dilated with mild surrounding wall thickening. Unremarkable small bowel. Large hiatal hernia. The appendix is not definitely identified.   Vascular/Lymphatic: No significant vascular findings are  present. No enlarged abdominal or pelvic lymph nodes.   Reproductive: Mildly enlarged prostate gland.   Other: Moderate-sized right inguinal hernia containing fat.   Musculoskeletal: Moderate thoracolumbar and lower thoracic spine degenerative changes. Stable left hip dysplastic changes.   IMPRESSION: 1. Prominent stool throughout the colon, most pronounced in the rectum and distal sigmoid colon. These are both markedly dilated with mild surrounding wall thickening. This is consistent with fecal impaction and stercoral colitis. 2. Mild diffuse bladder wall thickening, likely due to chronic bladder outlet obstruction from the mildly enlarged prostate gland or chronically dilated, impacted rectum. Cystitis is also a possibility. 3. Stable large hiatal hernia. 4. Moderate-sized right inguinal hernia containing fat.     Electronically Signed   By: Elspeth Bathe M.D.   On: 07/29/2023 18:23  _________________________  CLINICAL DATA:  Abdominal distension   EXAM: ABDOMEN - 1 VIEW   COMPARISON:  Abdominal radiograph dated 08/04/2023   FINDINGS: Similar diffuse gas-filled bowel loops throughout the abdomen. Nonobstructive bowel gas pattern. Upper abdomen is not included within the field of view. Large volume stool within the ascending colon. Small volume stool projecting over the rectum. No abnormal radio-opaque calculi or mass effect. Similar left hip dysplasia. No acute dislocation.   Partially imaged lung bases are clear.   IMPRESSION: Nonobstructive bowel gas pattern. Similar diffuse gas-filled bowel loops throughout the abdomen. Large volume stool within the ascending colon.     Electronically Signed   By: Limin  Xu M.D.   On: 08/05/2023 12:23   (Images personally reviewed-H Danis MD) ____________________________________________ Other:   _____________________________________________ Assessment & Plan  Assessment: Encounter Diagnoses  Name Primary?    Chronic constipation Yes   Abnormal finding on GI tract imaging    Fecal impaction (HCC)    Quintavious has chronic constipation due to his neurologic condition and limited mobility, and at this point he has almost certainly developed progressive GI dysmotility as a result.  The reported colitis on the CT scan was from the distention and perhaps some mucosal inflammation due to the obstipation.  I do not think he has IBD and are not currently planning a lower endoscopic evaluation of them.  Thorvald and I had about the same discussion that we usually do when he comes to see me for this issue.  I still recommend he try taking the MiraLAX  regularly, perhaps in the evening rather than in the morning.  That way perhaps it has effect by the following morning that is somewhat more predictable given his work schedule.  Getting more aggressive with escalating doses of that or trying pro secretory medicines will probably only make his stool loose and lead to incontinence. I again raised the idea of a colostomy but he is dead set against that.  Since he has a live-in caregiver, I suggested that on the weekends  when he is not working he could be administered 1 or 2 fleets enemas to get a reasonably good lower cleanout, thus allowing regular use of MiraLAX  to work better and decrease in the chance that he will have recurrence of what led to the May hospitalization.  Rebel says he will give it further consideration and contact me if he wants more advice.   32 minutes were spent on this encounter, including in depth chart review, independent review of results as outlined above, communicating results with the patient directly, face-to-face time with the patient, coordinating care, ordering studies and medications as appropriate, and documentation.   Victory LITTIE Brand III

## 2023-10-14 NOTE — Patient Instructions (Signed)
 _______________________________________________________  If your blood pressure at your visit was 140/90 or greater, please contact your primary care physician to follow up on this.  _______________________________________________________  If you are age 52 or older, your body mass index should be between 23-30. Your There is no height or weight on file to calculate BMI. If this is out of the aforementioned range listed, please consider follow up with your Primary Care Provider.  If you are age 40 or younger, your body mass index should be between 19-25. Your There is no height or weight on file to calculate BMI. If this is out of the aformentioned range listed, please consider follow up with your Primary Care Provider.   ________________________________________________________  The [facility] providers would like to encourage you to use MYCHART to communicate with providers for non-urgent requests or questions.  Due to long hold times on the telephone, sending your provider a message by [facility] may be a faster and more efficient way to get a response.  Please allow 48 business hours for a response.  Please remember that this is for non-urgent requests.  _______________________________________________________  [facility] is using a team-based approach to care.  Your team is made up of your doctor and two to three APPS. Our APPS (Nurse Practitioners and Physician Assistants) work with your physician to ensure care continuity for you. They are fully qualified to address your health concerns and develop a treatment plan. They communicate directly with your gastroenterologist to care for you. Seeing the Advanced Practice Practitioners on your physician's team can help you by facilitating care more promptly, often allowing for earlier appointments, access to diagnostic testing, procedures, and other specialty referrals.    Thank you for trusting me with your gastrointestinal care!    Dr.  Victory Legrand [facility]

## 2024-01-12 ENCOUNTER — Ambulatory Visit: Admitting: Physician Assistant

## 2024-01-12 ENCOUNTER — Encounter: Payer: Self-pay | Admitting: Physician Assistant

## 2024-01-12 DIAGNOSIS — Z872 Personal history of diseases of the skin and subcutaneous tissue: Secondary | ICD-10-CM | POA: Diagnosis not present

## 2024-01-12 DIAGNOSIS — D1801 Hemangioma of skin and subcutaneous tissue: Secondary | ICD-10-CM

## 2024-01-12 NOTE — Progress Notes (Signed)
   New Patient Visit   Subjective  Thomas Ramos is a 53 y.o. male NEW PATIENT who presents for the following: REFERRAL for Cellulitis from February of 2025.   Janis was previously diagnosed with cellulitis by his PCP. He states that his PCP gave him an antibiotic and it cleared up. Danyell states that he is not currently having any issues but he and his PCP would like for his legs to be looked at to be sure he no longer has cellulitis.   Also has palpable red lesions on scalp.   The following portions of the chart were reviewed this encounter and updated as appropriate: medications, allergies, medical history  Review of Systems:  No other skin or systemic complaints except as noted in HPI or Assessment and Plan.  Objective  Well appearing patient in no apparent distress; mood and affect are within normal limits.  A focused examination was performed of the following areas: Legs and scalp   Relevant exam findings are noted in the Assessment and Plan.    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  I, Gordan Beams, CMA, am acting as scribe for Deagen Krass K, PA-C.   Documentation: I have reviewed the above documentation for accuracy and completeness, and I agree with the above.  Jack Bolio K, PA-C

## 2024-01-12 NOTE — Patient Instructions (Addendum)
 Cellulitis Resolved  CHERRY ANGIOMAS ON SCALP (BENIGN)   Important Information  Due to recent changes in healthcare laws, you may see results of your pathology and/or laboratory studies on MyChart before the doctors have had a chance to review them. We understand that in some cases there may be results that are confusing or concerning to you. Please understand that not all results are received at the same time and often the doctors may need to interpret multiple results in order to provide you with the best plan of care or course of treatment. Therefore, we ask that you please give us  2 business days to thoroughly review all your results before contacting the office for clarification. Should we see a critical lab result, you will be contacted sooner.   If You Need Anything After Your Visit  If you have any questions or concerns for your doctor, please call our main line at [phone] If no one answers, please leave a voicemail as directed and we will return your call as soon as possible. Messages left after 4 pm will be answered the following business day.   You may also send us  a message via MyChart. We typically respond to MyChart messages within 1-2 business days.  For prescription refills, please ask your pharmacy to contact our office. Our fax number is [phone].  If you have an urgent issue when the clinic is closed that cannot wait until the next business day, you can page your doctor at the number below.    Please note that while we do our best to be available for urgent issues outside of office hours, we are not available 24/7.   If you have an urgent issue and are unable to reach us , you may choose to seek medical care at your doctor's office, retail clinic, urgent care center, or emergency room.  If you have a medical emergency, please immediately call 911 or go to the emergency department. In the event of inclement weather, please call our main line at [phone] for an  update on the status of any delays or closures.  Dermatology Medication Tips: Please keep the boxes that topical medications come in in order to help keep track of the instructions about where and how to use these. Pharmacies typically print the medication instructions only on the boxes and not directly on the medication tubes.   If your medication is too expensive, please contact our office at [phone] or send us  a message through MyChart.   We are unable to tell what your co-pay for medications will be in advance as this is different depending on your insurance coverage. However, we may be able to find a substitute medication at lower cost or fill out paperwork to get insurance to cover a needed medication.   If a prior authorization is required to get your medication covered by your insurance company, please allow us  1-2 business days to complete this process.  Drug prices often vary depending on where the prescription is filled and some pharmacies may offer cheaper prices.  The website www.goodrx.com contains coupons for medications through different pharmacies. The prices here do not account for what the cost may be with help from insurance (it may be cheaper with your insurance), but the website can give you the price if you did not use any insurance.  - You can print the associated coupon and take it with your prescription to the pharmacy.  - You may also stop by our office during regular business hours  and pick up a GoodRx coupon card.  - If you need your prescription sent electronically to a different pharmacy, notify our office through [facility] or by phone at [phone]

## 2024-01-18 ENCOUNTER — Ambulatory Visit: Admitting: Podiatry

## 2024-01-18 DIAGNOSIS — Z91198 Patient's noncompliance with other medical treatment and regimen for other reason: Secondary | ICD-10-CM

## 2024-01-18 NOTE — Progress Notes (Signed)
 1. Failure to attend appointment with reason given    Patient left before being seen. Clinic running behind. Patient had to catch transporation.

## 2024-01-25 ENCOUNTER — Ambulatory Visit: Admitting: Internal Medicine

## 2024-02-29 ENCOUNTER — Emergency Department (HOSPITAL_COMMUNITY)

## 2024-02-29 ENCOUNTER — Emergency Department (HOSPITAL_COMMUNITY)
Admission: EM | Admit: 2024-02-29 | Discharge: 2024-03-01 | Disposition: A | Discharge status: Home / Self Care | Attending: Emergency Medicine | Admitting: Emergency Medicine

## 2024-02-29 ENCOUNTER — Other Ambulatory Visit: Payer: Self-pay

## 2024-02-29 ENCOUNTER — Encounter (HOSPITAL_COMMUNITY): Payer: Self-pay

## 2024-02-29 DIAGNOSIS — E871 Hypo-osmolality and hyponatremia: Secondary | ICD-10-CM | POA: Diagnosis not present

## 2024-02-29 DIAGNOSIS — R8271 Bacteriuria: Secondary | ICD-10-CM | POA: Insufficient documentation

## 2024-02-29 DIAGNOSIS — I509 Heart failure, unspecified: Secondary | ICD-10-CM | POA: Insufficient documentation

## 2024-02-29 DIAGNOSIS — R509 Fever, unspecified: Secondary | ICD-10-CM

## 2024-02-29 DIAGNOSIS — R051 Acute cough: Secondary | ICD-10-CM | POA: Insufficient documentation

## 2024-02-29 DIAGNOSIS — E1165 Type 2 diabetes mellitus with hyperglycemia: Secondary | ICD-10-CM | POA: Insufficient documentation

## 2024-02-29 DIAGNOSIS — R531 Weakness: Secondary | ICD-10-CM | POA: Diagnosis not present

## 2024-02-29 DIAGNOSIS — Z7984 Long term (current) use of oral hypoglycemic drugs: Secondary | ICD-10-CM | POA: Insufficient documentation

## 2024-02-29 LAB — I-STAT CHEM 8, ED
BUN: 10 mg/dL (ref 6–20)
Calcium, Ion: 1.16 mmol/L (ref 1.15–1.40)
Chloride: 100 mmol/L (ref 98–111)
Creatinine, Ser: 0.9 mg/dL (ref 0.61–1.24)
Glucose, Bld: 221 mg/dL — ABNORMAL HIGH (ref 70–99)
HCT: 40 % (ref 39.0–52.0)
Hemoglobin: 13.6 g/dL (ref 13.0–17.0)
Potassium: 3.6 mmol/L (ref 3.5–5.1)
Sodium: 135 mmol/L (ref 135–145)
TCO2: 20 mmol/L — ABNORMAL LOW (ref 22–32)

## 2024-02-29 LAB — CBC WITH DIFFERENTIAL/PLATELET
Abs Immature Granulocytes: 0.05 K/uL (ref 0.00–0.07)
Basophils Absolute: 0 K/uL (ref 0.0–0.1)
Basophils Relative: 1 %
Eosinophils Absolute: 0 K/uL (ref 0.0–0.5)
Eosinophils Relative: 1 %
HCT: 39.1 % (ref 39.0–52.0)
Hemoglobin: 12.4 g/dL — ABNORMAL LOW (ref 13.0–17.0)
Immature Granulocytes: 1 %
Lymphocytes Relative: 14 %
Lymphs Abs: 1.1 K/uL (ref 0.7–4.0)
MCH: 23.6 pg — ABNORMAL LOW (ref 26.0–34.0)
MCHC: 31.7 g/dL (ref 30.0–36.0)
MCV: 74.5 fL — ABNORMAL LOW (ref 80.0–100.0)
Monocytes Absolute: 0.5 K/uL (ref 0.1–1.0)
Monocytes Relative: 7 %
Neutro Abs: 5.8 K/uL (ref 1.7–7.7)
Neutrophils Relative %: 76 %
Platelets: 189 K/uL (ref 150–400)
RBC: 5.25 MIL/uL (ref 4.22–5.81)
RDW: 14.9 % (ref 11.5–15.5)
WBC: 7.5 K/uL (ref 4.0–10.5)
nRBC: 0 % (ref 0.0–0.2)

## 2024-02-29 LAB — URINALYSIS, W/ REFLEX TO CULTURE (INFECTION SUSPECTED)
Bilirubin Urine: NEGATIVE
Glucose, UA: >=500 mg/dL — AB
Ketones, ur: NEGATIVE mg/dL
Nitrite: NEGATIVE
Protein, ur: 30 mg/dL — AB
Specific Gravity, Urine: 1.02 (ref 1.005–1.030)
pH: 5.5 (ref 5.0–8.0)

## 2024-02-29 LAB — COMPREHENSIVE METABOLIC PANEL WITH GFR
ALT: 10 U/L (ref 0–44)
AST: 16 U/L (ref 15–41)
Albumin: 4.2 g/dL (ref 3.5–5.0)
Alkaline Phosphatase: 117 U/L (ref 38–126)
Anion gap: 13 (ref 5–15)
BUN: 11 mg/dL (ref 6–20)
CO2: 21 mmol/L — ABNORMAL LOW (ref 22–32)
Calcium: 9.7 mg/dL (ref 8.9–10.3)
Chloride: 99 mmol/L (ref 98–111)
Creatinine, Ser: 0.93 mg/dL (ref 0.61–1.24)
GFR, Estimated: >60 mL/min (ref >60)
Glucose, Bld: 213 mg/dL — ABNORMAL HIGH (ref 70–99)
Potassium: 3.7 mmol/L (ref 3.5–5.1)
Sodium: 133 mmol/L — ABNORMAL LOW (ref 135–145)
Total Bilirubin: 0.5 mg/dL (ref 0.0–1.2)
Total Protein: 7.2 g/dL (ref 6.5–8.1)

## 2024-02-29 LAB — RESP PANEL BY RT-PCR (RSV, FLU A&B, COVID)  RVPGX2
Influenza A by PCR: NEGATIVE
Influenza B by PCR: NEGATIVE
Resp Syncytial Virus by PCR: NEGATIVE
SARS Coronavirus 2 by RT PCR: NEGATIVE

## 2024-02-29 LAB — PROTIME-INR
INR: 1.2 (ref 0.8–1.2)
Prothrombin Time: 15.6 s — ABNORMAL HIGH (ref 11.4–15.2)

## 2024-02-29 LAB — PRO BRAIN NATRIURETIC PEPTIDE: Pro Brain Natriuretic Peptide: 133 pg/mL (ref <300.0)

## 2024-02-29 LAB — I-STAT CG4 LACTIC ACID, ED: Lactic Acid, Venous: 1.8 mmol/L (ref 0.5–1.9)

## 2024-02-29 MED ORDER — ACETAMINOPHEN 325 MG PO TABS
650.0000 mg | ORAL_TABLET | Freq: Four times a day (QID) | ORAL | Status: DC | PRN
  Administered 2024-02-29: 650 mg via ORAL
  Filled 2024-02-29: qty 2

## 2024-02-29 MED ORDER — CEPHALEXIN 500 MG PO CAPS: 500.0000 mg | ORAL_CAPSULE | Freq: Two times a day (BID) | ORAL | 0 refills | Status: DC

## 2024-02-29 MED ORDER — CEPHALEXIN 250 MG PO CAPS
500.0000 mg | ORAL_CAPSULE | Freq: Once | ORAL | Status: AC
  Administered 2024-02-29: 500 mg via ORAL
  Filled 2024-02-29: qty 2

## 2024-02-29 MED ORDER — IOHEXOL 350 MG/ML SOLN
75.0000 mL | Freq: Once | INTRAVENOUS | Status: AC | PRN
  Administered 2024-02-29: 21:00:00 75 mL via INTRAVENOUS

## 2024-02-29 NOTE — Discharge Instructions (Signed)
 You have bacteria in your urine. You Ct scan of you chest did not show a pneumonia.  Your COVID, flu and RSV test were negative.  Follow-up outpatient.  Return for new or worsening symptoms

## 2024-02-29 NOTE — ED Triage Notes (Signed)
 Pt BIB [facility] where he is reported to have 4 days of coughing, fever & tachycardia. 140/68, 130 bpm, 98% on RA, 38 resp, 102 Fever was given 650mg  Tylenol  & 20g Lt Wrist PIV.

## 2024-02-29 NOTE — ED Provider Notes (Signed)
 [facility] AT [facility] Provider Note   CSN: [id number] Arrival date & time: 02/29/24  1801     Patient presents with: Fever and Tachycardia   Thomas Ramos is a 53 y.o. male here for evaluation of fever.  History of cerebral palsy, CHF, diabetes.  Has had 4 days of nonproductive cough as well as fever.  Temp 102 with EMS, given Tylenol .  States cough is nonproductive.  No headache, sore throat, rhinorrhea, chest pain, shortness of breath abdominal pain, nausea or vomiting.  Denies any dysuria, diarrhea.  He denies any rashes, lesions, bedsores.  No one is sick at his living facility   HPI     Prior to Admission medications  Medication Sig Start Date End Date Taking? Authorizing Provider  cephALEXin  (KEFLEX ) 500 MG capsule Take 1 capsule (500 mg total) by mouth 2 (two) times daily for 7 days. 02/29/24 03/07/24 Yes Kebra Lowrimore A, PA-C  acetaminophen  (TYLENOL ) 325 MG tablet Take 2 tablets (650 mg total) by mouth every 6 (six) hours as needed for mild pain (pain score 1-3), moderate pain (pain score 4-6) or fever (or Fever >/= 101). 04/23/23   Hongalgi, Trenda BIRCH, MD  Albuterol  Sulfate (PROAIR  RESPICLICK) 108 (90 Base) MCG/ACT AEPB Inhale 2 puffs into the lungs every 6 (six) hours as needed (Shortness of breath). 05/03/20   [provider]  baclofen  (LIORESAL ) 10 MG tablet Take 2.5 mg by mouth at bedtime. 06/01/18   [provider]  furosemide  (LASIX ) 20 MG tablet Take 1 tablet (20 mg total) by mouth daily as needed for fluid or edema. 08/04/23   Laurence Locus, DO  latanoprost  (XALATAN ) 0.005 % ophthalmic solution Place 1 drop into both eyes at bedtime. 08/25/21   [provider]  metFORMIN  (GLUCOPHAGE -XR) 500 MG 24 hr tablet Take 500 mg by mouth. 12/29/23   [provider]  metoprolol  succinate (TOPROL -XL) 25 MG 24 hr tablet Take 0.5 tablets (12.5 mg total) by mouth daily. 07/01/23   Meng, Hao, PA  montelukast  (SINGULAIR ) 10 MG  tablet Take 1 tablet by mouth at bedtime. 07/28/21   [provider]  nitroGLYCERIN  (NITROSTAT ) 0.4 MG SL tablet Place 0.4 mg under the tongue every 5 (five) minutes as needed for chest pain.    [provider]  omeprazole  (PRILOSEC) 20 MG capsule Take 1 capsule by mouth every morning. 12/12/19   [provider]  ondansetron  (ZOFRAN -ODT) 4 MG disintegrating tablet Take 1 tablet (4 mg total) by mouth every 8 (eight) hours as needed for nausea or vomiting. 08/02/23   Sherrill Cable Latif, DO  rosuvastatin  (CRESTOR ) 5 MG tablet Take 5 mg by mouth at bedtime.    [provider]  simethicone  (MYLICON) 80 MG chewable tablet Chew 1 tablet (80 mg total) by mouth 4 (four) times daily as needed for flatulence. 08/02/23   Sherrill Cable Latif, DO  timolol  (TIMOPTIC ) 0.5 % ophthalmic solution Place 1 drop into both eyes 2 (two) times daily. 04/10/20   [provider]    Allergies: Penicillins, Sulfamethoxazole -trimethoprim , and Metronidazole     Review of Systems  Constitutional:  Positive for activity change, fatigue and fever.  HENT: Negative.    Respiratory:  Positive for cough. Negative for apnea, choking, chest tightness, shortness of breath, wheezing and stridor.   Cardiovascular: Negative.   Gastrointestinal: Negative.   Genitourinary: Negative.   Musculoskeletal: Negative.   Skin: Negative.   Neurological: Negative.   All other systems reviewed and are negative.  Updated Vital Signs BP (!) 148/86   Pulse 80   Temp 97.6 F (36.4 C) (Oral)   Resp (!) 22   SpO2 100%   Physical Exam Vitals and nursing note reviewed.  Constitutional:      General: He is not in acute distress.    Appearance: He is well-developed. He is ill-appearing (chronically ill appearing). He is not toxic-appearing or diaphoretic.  HENT:     Head: Normocephalic and atraumatic.     Nose: Nose normal.     Mouth/Throat:     Mouth: Mucous membranes are moist.  Eyes:     Pupils:  Pupils are equal, round, and reactive to light.  Cardiovascular:     Rate and Rhythm: Normal rate and regular rhythm.     Pulses: Normal pulses.     Heart sounds: Normal heart sounds.  Pulmonary:     Effort: Pulmonary effort is normal. No respiratory distress.     Breath sounds: Normal breath sounds.  Abdominal:     General: Bowel sounds are normal. There is no distension.     Palpations: Abdomen is soft.     Tenderness: There is no abdominal tenderness. There is no right CVA tenderness, left CVA tenderness or guarding.  Musculoskeletal:        General: Normal range of motion.     Cervical back: Normal range of motion and neck supple.     Comments: Contractures right upper extremity, bilateral lower extremities  Skin:    General: Skin is warm and dry.  Neurological:     Mental Status: He is alert. Mental status is at baseline.     Motor: Weakness present.     (all labs ordered are listed, but only abnormal results are displayed) Labs Reviewed  COMPREHENSIVE METABOLIC PANEL WITH GFR - Abnormal; Notable for the following components:      Result Value   Sodium 133 (*)    CO2 21 (*)    Glucose, Bld 213 (*)    All other components within normal limits  CBC WITH DIFFERENTIAL/PLATELET - Abnormal; Notable for the following components:   Hemoglobin 12.4 (*)    MCV 74.5 (*)    MCH 23.6 (*)    All other components within normal limits  PROTIME-INR - Abnormal; Notable for the following components:   Prothrombin Time 15.6 (*)    All other components within normal limits  URINALYSIS, W/ REFLEX TO CULTURE (INFECTION SUSPECTED) - Abnormal; Notable for the following components:   APPearance HAZY (*)    Glucose, UA >=500 (*)    Hgb urine dipstick SMALL (*)    Protein, ur 30 (*)    Leukocytes,Ua SMALL (*)    Bacteria, UA RARE (*)    All other components within normal limits  I-STAT CHEM 8, ED - Abnormal; Notable for the following components:   Glucose, Bld 221 (*)    TCO2 20 (*)     All other components within normal limits  RESP PANEL BY RT-PCR (RSV, FLU A&B, COVID)  RVPGX2  CULTURE, BLOOD (ROUTINE X 2)  CULTURE, BLOOD (ROUTINE X 2)  URINE CULTURE  PRO BRAIN NATRIURETIC PEPTIDE  I-STAT CG4 LACTIC ACID, ED    EKG: None  Radiology: CT Angio Chest PE W and/or Wo Contrast Result Date: 02/29/2024 EXAM: CTA of the Chest with contrast for PE 02/29/2024 08:57:00 PM TECHNIQUE: CTA of the chest was performed without and with the administration of 75 mL of iohexol  (OMNIPAQUE ) 350 MG/ML injection. Multiplanar reformatted images  are provided for review. MIP images are provided for review. Automated exposure control, iterative reconstruction, and/or weight based adjustment of the mA/kV was utilized to reduce the radiation dose to as low as reasonably achievable. COMPARISON: 07/13/2022 CLINICAL HISTORY: Fever and tachycardia. FINDINGS: PULMONARY ARTERIES: No intraluminal filling defect that suggests pulmonary embolism is noted. Main pulmonary artery is normal in caliber. The pulmonary artery shows a normal branching pattern bilaterally. MEDIASTINUM: The heart is not significantly enlarged in size. No significant coronary calcifications are noted. The pericardium demonstrates no acute abnormality. The aorta shows no aneurysmal dilatation or dissection. The thoracic inlet is within normal limits. The esophagus is unremarkable. LYMPH NODES: No mediastinal, hilar or axillary lymphadenopathy. LUNGS AND PLEURA: The lungs are well aerated bilaterally. No focal infiltrate or effusion is seen. Very mild emphysematous changes are noted. No sizable parenchymal nodules seen. No pneumothorax. UPPER ABDOMEN: Moderate-sized sliding type hiatal hernia is noted. Visualized upper abdomen shows cystic change within the right kidney. No follow-up is recommended. SOFT TISSUES AND BONES: Degenerative changes of the thoracic spine are seen. No acute soft tissue abnormality. IMPRESSION: 1. No evidence of pulmonary  embolism. Electronically signed by: Oneil Devonshire MD 02/29/2024 09:10 PM EST RP Workstation: [id number]   DG Chest Port 1 View Result Date: 02/29/2024 CLINICAL DATA:  Questionable sepsis - evaluate for abnormality EXAM: PORTABLE CHEST - 1 VIEW COMPARISON:  September 02, 2023 FINDINGS: Markedly low lung volumes with elevation of the right hemidiaphragm. Hazy attenuation throughout both lungs. No pleural effusion or pneumothorax. Mild cardiomegaly. No acute fracture or destructive lesions. IMPRESSION: Markedly low lung volumes. Hazy attenuation throughout both lungs, which may reflect a combination of bronchovascular crowding and atelectasis. If there is continued concern for underlying pneumonia, a two-view chest radiograph with improved aeration would be recommended. Electronically Signed   By: Rogelia Myers M.D.   On: 02/29/2024 19:02     Procedures   Medications Ordered in the ED  cephALEXin  (KEFLEX ) capsule 500 mg (has no administration in time range)  acetaminophen  (TYLENOL ) tablet 650 mg (has no administration in time range)  iohexol  (OMNIPAQUE ) 350 MG/ML injection 75 mL (75 mLs Intravenous Contrast Given 02/29/24 153)    53 year old multiple medical comorbidities here for evaluation of fever and cough.  Symptoms over the last 4 days.  Febrile with EMS, received antipyretic.  Nonproductive cough however no chest pain, shortness of breath.  No abdominal pain.  No sick contacts at living facility.  Will plan on labs, imaging, reassess  Labs and imaging personally viewed interpreted:  CBC without leukocytosis, hemoglobin 12.4-similar to baseline Metabolic panel sodium 133, glucose 213 Lactic 1.8 BNP 133 Chest x-ray low lung volumes, hazy attenuation both lungs possible bronchovesicular crowding and atelectasis EKG sinus tachycardia.  Discussed results with patient.  Will plan on CT scan chest for better evaluation.  CT chest without acute abnormality.  Discussed results with patient.  He  has been afebrile here.  No leukocytosis, normal lactic acid.  COVID, flu, RSV negative.  Will plan on UA.  To assess for other area of infection.  Cath UA 21-50 WBC, small leuks, rare bacteria.  Sent for culture.  Will treat with Keflex , while awaiting culture.  No abdominal pain, dysuria, hematuria.  Discussed results with patient.  Will DC home.  Low suspicion for bacteremia at this time.  The patient has been appropriately medically screened and/or stabilized in the ED. I have low suspicion for any other emergent medical condition which would require further screening, evaluation or treatment  in the ED or require inpatient management.  Patient is hemodynamically stable and in no acute distress.  Patient able to ambulate in department prior to ED.  Evaluation does not show acute pathology that would require ongoing or additional emergent interventions while in the emergency department or further inpatient treatment.  I have discussed the diagnosis with the patient and answered all questions.  Pain is been managed while in the emergency department and patient has no further complaints prior to discharge.  Patient is comfortable with plan discussed in room and is stable for discharge at this time.  I have discussed strict return precautions for returning to the emergency department.  Patient was encouraged to follow-up with PCP/specialist refer to at discharge.                                   Medical Decision Making Amount and/or Complexity of Data Reviewed Independent Historian: EMS External Data Reviewed: labs, radiology, ECG and notes. Labs: ordered. Decision-making details documented in ED Course. Radiology: ordered and independent interpretation performed. Decision-making details documented in ED Course. ECG/medicine tests: ordered and independent interpretation performed. Decision-making details documented in ED Course.  Risk OTC drugs. Prescription drug management. Decision  regarding hospitalization. Diagnosis or treatment significantly limited by social determinants of health.       Final diagnoses:  Acute cough  Fever, unspecified fever cause  Bacteria in urine    ED Discharge Orders          Ordered    cephALEXin  (KEFLEX ) 500 MG capsule  2 times daily        02/29/24 2353               Winnifred Dufford A, PA-C 02/29/24 2353

## 2024-03-01 MED ORDER — CEPHALEXIN 500 MG PO CAPS: 500.0000 mg | ORAL_CAPSULE | Freq: Two times a day (BID) | ORAL | 0 refills | Status: AC

## 2024-03-01 NOTE — ED Notes (Signed)
 Spoke with pt caregiver Antonio and informed him pt is ready for dc, [facility] called, and he confirms he will be home when [facility] arrives to drop off pt.

## 2024-03-03 LAB — URINE CULTURE: Culture: >=100000 — AB

## 2024-03-04 ENCOUNTER — Telehealth (HOSPITAL_BASED_OUTPATIENT_CLINIC_OR_DEPARTMENT_OTHER): Payer: Self-pay | Admitting: *Deleted

## 2024-03-04 NOTE — Telephone Encounter (Signed)
 Post ED Visit - Positive Culture Follow-up  Culture report reviewed by antimicrobial stewardship pharmacist: [facility] []  Rankin Dee, Pharm.D. []  Venetia Gully, Pharm.D., BCPS AQ-ID []  Garrel Crews, Pharm.D., BCPS []  Almarie Lunger, Pharm.D., BCPS []  [employer], [address].D., BCPS, AAHIVP []  Rosaline Bihari, Pharm.D., BCPS, AAHIVP []  Vernell Meier, PharmD, BCPS []  Latanya Hint, PharmD, BCPS []  Donald Medley, PharmD, BCPS []  Rocky Bold, PharmD []  Dorothyann Alert, PharmD, BCPS [x]  Dorn Buttner,  PharmD  [facility] []  Rosaline Edison, PharmD []  Romona Bliss, PharmD []  Dolphus Roller, PharmD []  Veva Seip, Rph []  Vernell Daunt) Leonce, PharmD []  Eva Allis, PharmD []  Rosaline Millet, PharmD []  Iantha Batch, PharmD []  Arvin Gauss, PharmD []  Wanda Hasting, PharmD []  Ronal Rav, PharmD []  Rocky Slade, PharmD []  Bard Jeans, PharmD   Positive urine culture Treated with Cephalexin , organism sensitive to the same and no further patient follow-up is required at this time.  Thomas Ramos 03/04/2024, 1:43 PM

## 2024-03-05 LAB — CULTURE, BLOOD (ROUTINE X 2)
Culture: NO GROWTH
Culture: NO GROWTH
Special Requests: ADEQUATE
Special Requests: ADEQUATE

## 2024-03-07 ENCOUNTER — Ambulatory Visit: Admitting: Internal Medicine

## 2024-04-10 ENCOUNTER — Other Ambulatory Visit: Payer: Self-pay

## 2024-04-10 ENCOUNTER — Emergency Department (HOSPITAL_COMMUNITY)

## 2024-04-10 ENCOUNTER — Emergency Department (HOSPITAL_COMMUNITY)
Admission: EM | Admit: 2024-04-10 | Discharge: 2024-04-10 | Disposition: A | Discharge status: Home / Self Care | Attending: Emergency Medicine | Admitting: Emergency Medicine

## 2024-04-10 ENCOUNTER — Encounter (HOSPITAL_COMMUNITY): Payer: Self-pay

## 2024-04-10 DIAGNOSIS — R112 Nausea with vomiting, unspecified: Secondary | ICD-10-CM | POA: Diagnosis not present

## 2024-04-10 DIAGNOSIS — Z7984 Long term (current) use of oral hypoglycemic drugs: Secondary | ICD-10-CM | POA: Diagnosis not present

## 2024-04-10 DIAGNOSIS — R051 Acute cough: Secondary | ICD-10-CM | POA: Insufficient documentation

## 2024-04-10 DIAGNOSIS — E119 Type 2 diabetes mellitus without complications: Secondary | ICD-10-CM | POA: Diagnosis not present

## 2024-04-10 DIAGNOSIS — R0602 Shortness of breath: Secondary | ICD-10-CM | POA: Diagnosis not present

## 2024-04-10 DIAGNOSIS — I509 Heart failure, unspecified: Secondary | ICD-10-CM | POA: Diagnosis not present

## 2024-04-10 DIAGNOSIS — R059 Cough, unspecified: Secondary | ICD-10-CM | POA: Diagnosis present

## 2024-04-10 LAB — CBC
HCT: 40.4 % (ref 39.0–52.0)
Hemoglobin: 12.7 g/dL — ABNORMAL LOW (ref 13.0–17.0)
MCH: 24 pg — ABNORMAL LOW (ref 26.0–34.0)
MCHC: 31.4 g/dL (ref 30.0–36.0)
MCV: 76.4 fL — ABNORMAL LOW (ref 80.0–100.0)
Platelets: 211 10*3/uL (ref 150–400)
RBC: 5.29 MIL/uL (ref 4.22–5.81)
RDW: 14.6 % (ref 11.5–15.5)
WBC: 6.3 10*3/uL (ref 4.0–10.5)
nRBC: 0 % (ref 0.0–0.2)

## 2024-04-10 LAB — BASIC METABOLIC PANEL WITH GFR
Anion gap: 16 — ABNORMAL HIGH (ref 5–15)
BUN: 13 mg/dL (ref 6–20)
CO2: 20 mmol/L — ABNORMAL LOW (ref 22–32)
Calcium: 9.6 mg/dL (ref 8.9–10.3)
Chloride: 99 mmol/L (ref 98–111)
Creatinine, Ser: 0.89 mg/dL (ref 0.61–1.24)
GFR, Estimated: >60 mL/min
Glucose, Bld: 128 mg/dL — ABNORMAL HIGH (ref 70–99)
Potassium: 4.1 mmol/L (ref 3.5–5.1)
Sodium: 135 mmol/L (ref 135–145)

## 2024-04-10 LAB — CBG MONITORING, ED: Glucose-Capillary: 123 mg/dL — ABNORMAL HIGH (ref 70–99)

## 2024-04-10 MED ORDER — BENZONATATE 100 MG PO CAPS: 100.0000 mg | ORAL_CAPSULE | Freq: Three times a day (TID) | ORAL | 0 refills | Status: AC

## 2024-04-10 MED ORDER — ONDANSETRON 4 MG PO TBDP
4.0000 mg | ORAL_TABLET | Freq: Once | ORAL | Status: AC
  Administered 2024-04-10: 4 mg via ORAL
  Filled 2024-04-10: qty 1

## 2024-04-10 MED ORDER — ONDANSETRON HCL 4 MG PO TABS: 4.0000 mg | ORAL_TABLET | Freq: Four times a day (QID) | ORAL | 0 refills | Status: AC

## 2024-04-10 MED ORDER — ONDANSETRON HCL 4 MG PO TABS: 4.0000 mg | ORAL_TABLET | Freq: Four times a day (QID) | ORAL | 0 refills | Status: DC

## 2024-04-10 MED ORDER — BENZONATATE 100 MG PO CAPS: 100.0000 mg | ORAL_CAPSULE | Freq: Three times a day (TID) | ORAL | 0 refills | Status: DC

## 2024-04-10 MED ORDER — PAXLOVID (300/100) 20 X 150 MG & 10 X 100MG PO TBPK: 3.0000 | ORAL_TABLET | Freq: Two times a day (BID) | ORAL | 0 refills | Status: AC

## 2024-04-10 NOTE — Discharge Instructions (Addendum)
 Thank you letting us  take care of you today  We did testing here that was reassuring including blood work as well as a chest x-ray.  We think this is all related to a virus.  Please take Tylenol  and ibuprofen  as needed.  We have a prescription for Paxlovid  to help with your COVID symptoms You need to hold your Crestor  (rosuvastatin ) while taking Paxlovid  and resume it immediately after Paxlovid  is done.  Please follow-up with your primary care doctor for further evaluation.  Please comeback to the emergency department if you have persistent or worsening symptoms.

## 2024-04-10 NOTE — ED Notes (Signed)
 Patient transported to X-ray

## 2024-04-10 NOTE — ED Triage Notes (Signed)
 Pt BIB EMS from home for cough and congestion since yesterday. Pt did home covid test today which was positive. Pt is vomiting upon arrival.   EMS Vitals BP 138/82 HR 94

## 2024-04-10 NOTE — ED Provider Notes (Addendum)
 " [facility] AT [facility] Provider Note   CSN: [id number] Arrival date & time: 04/10/24  1401     Patient presents with: Emesis and Shortness of Breath   Thomas Ramos is a 54 y.o. male with a past medical history of cerebral palsy, CHF, diabetes who presents today for evaluation of cough as well as shortness of breath.  Patient reports that his symptoms initially developed yesterday with cough, shortness of breath.  Patient has had some cough productive of clear or white sputum.  Subjective fevers.  Due to ongoing symptoms presenting today for further evaluation.  Patient did report that he took a home COVID test that was positive.  Patient reported that he did develop some nausea and emesis today particularly with transitioning here in the emergency department.   Emesis Shortness of Breath Associated symptoms: vomiting        Prior to Admission medications  Medication Sig Start Date End Date Taking? Authorizing Provider  acetaminophen  (TYLENOL ) 325 MG tablet Take 2 tablets (650 mg total) by mouth every 6 (six) hours as needed for mild pain (pain score 1-3), moderate pain (pain score 4-6) or fever (or Fever >/= 101). 04/23/23   Hongalgi, Trenda BIRCH, MD  Albuterol  Sulfate (PROAIR  RESPICLICK) 108 (90 Base) MCG/ACT AEPB Inhale 2 puffs into the lungs every 6 (six) hours as needed (Shortness of breath). 05/03/20   [provider]  baclofen  (LIORESAL ) 10 MG tablet Take 2.5 mg by mouth at bedtime. 06/01/18   [provider]  furosemide  (LASIX ) 20 MG tablet Take 1 tablet (20 mg total) by mouth daily as needed for fluid or edema. 08/04/23   Laurence Locus, DO  latanoprost  (XALATAN ) 0.005 % ophthalmic solution Place 1 drop into both eyes at bedtime. 08/25/21   [provider]  metFORMIN  (GLUCOPHAGE -XR) 500 MG 24 hr tablet Take 500 mg by mouth. 12/29/23   [provider]  metoprolol  succinate (TOPROL -XL) 25 MG 24 hr tablet Take 0.5 tablets  (12.5 mg total) by mouth daily. 07/01/23   Meng, Hao, PA  montelukast  (SINGULAIR ) 10 MG tablet Take 1 tablet by mouth at bedtime. 07/28/21   [provider]  nitroGLYCERIN  (NITROSTAT ) 0.4 MG SL tablet Place 0.4 mg under the tongue every 5 (five) minutes as needed for chest pain.    [provider]  omeprazole  (PRILOSEC) 20 MG capsule Take 1 capsule by mouth every morning. 12/12/19   [provider]  ondansetron  (ZOFRAN -ODT) 4 MG disintegrating tablet Take 1 tablet (4 mg total) by mouth every 8 (eight) hours as needed for nausea or vomiting. 08/02/23   Sherrill Cable Latif, DO  rosuvastatin  (CRESTOR ) 5 MG tablet Take 5 mg by mouth at bedtime.    [provider]  simethicone  (MYLICON) 80 MG chewable tablet Chew 1 tablet (80 mg total) by mouth 4 (four) times daily as needed for flatulence. 08/02/23   Sherrill Cable Latif, DO  timolol  (TIMOPTIC ) 0.5 % ophthalmic solution Place 1 drop into both eyes 2 (two) times daily. 04/10/20   [provider]    Allergies: Penicillins, Sulfamethoxazole -trimethoprim , and Metronidazole     Review of Systems  Respiratory:  Positive for shortness of breath.   Gastrointestinal:  Positive for vomiting.    Updated Vital Signs BP 136/88   Pulse 92   Temp 98.4 F (36.9 C) (Oral)   Resp (!) 22   Ht 5' 5 (1.651 m)   SpO2 100%   BMI 21.79 kg/m   Physical Exam  Constitutional:      General: He is not in acute distress. HENT:     Head: Normocephalic.     Mouth/Throat:     Mouth: Mucous membranes are moist.  Eyes:     Pupils: Pupils are equal, round, and reactive to light.  Cardiovascular:     Rate and Rhythm: Normal rate.  Pulmonary:     Effort: Pulmonary effort is normal.     Breath sounds: No decreased breath sounds or wheezing.  Abdominal:     Palpations: Abdomen is soft.  Musculoskeletal:        General: Normal range of motion.     Cervical back: Normal range of motion.  Skin:    General: Skin is warm.      Capillary Refill: Capillary refill takes less than 2 seconds.  Neurological:     General: No focal deficit present.     Mental Status: He is alert.  Psychiatric:        Mood and Affect: Mood normal.     (all labs ordered are listed, but only abnormal results are displayed) Labs Reviewed  CBC - Abnormal; Notable for the following components:      Result Value   Hemoglobin 12.7 (*)    MCV 76.4 (*)    MCH 24.0 (*)    All other components within normal limits  CBG MONITORING, ED - Abnormal; Notable for the following components:   Glucose-Capillary 123 (*)    All other components within normal limits  BASIC METABOLIC PANEL WITH GFR    EKG: None  Radiology: No results found.  Procedures   Medications Ordered in the ED - No data to display                                Medical Decision Making Amount and/or Complexity of Data Reviewed Labs: ordered. Radiology: ordered.  Risk Prescription drug management.   Patient is a 54 year old male who presents today for evaluation of cough and shortness of breath in the setting of a positive home COVID test.  On initial assessment patient was noted to be hemodynamically stable and afebrile.  On my bedside assessment patient was noted resting comfortably without acute distress.  Intermittent nonproductive cough on my examination.  Lungs with overall no focal findings on auscultation.  Patient does have bruising to the medial left and lateral right aspects of his distal lower extremities felt to be more so contact/pressure injuries.  No additional evidence of acute injury.  At the time my assessment patient already had laboratory evaluation that was completed including unremarkable CBC.  Metabolic panel with evidence of anion gap of 16 as well as CO2 of 20.  Chest x-ray without evidence of acute cardiac or pulmonary abnormalities.  On reassessment patient remained clinically and hemodynamically stable.  I do suspect that his symptoms are  related to a viral process.  There is no evidence of pneumonia at this point in time, lower suspicion for pulmonary embolism or acute heart failure exacerbation.  No evidence of wheezing.  No hypoxia or ongoing tachycardia that would warrant further laboratory evaluation or imaging studies.  I feel that his nausea vomiting is related to his viral process and not related to any acute intra-abdominal pathology given absence of abdominal pain and otherwise reassuring labs.  Recommended conservative management at home.  Close outpatient follow-up recommended. Final diagnoses:  Acute cough    ED Discharge Orders  Ordered    ondansetron  (ZOFRAN ) 4 MG tablet  Every 6 hours        04/10/24 1602    benzonatate  (TESSALON ) 100 MG capsule  Every 8 hours        04/10/24 1602               Laurita Sieving, MD 04/10/24 1603    Laurita Sieving, MD 04/10/24 1612    Pamella Ozell LABOR, DO 04/10/24 2320  "

## 2024-04-12 ENCOUNTER — Ambulatory Visit: Admitting: Podiatry

## 2024-04-18 ENCOUNTER — Ambulatory Visit: Admitting: Podiatry

## 2024-04-18 DIAGNOSIS — Z91198 Patient's noncompliance with other medical treatment and regimen for other reason: Secondary | ICD-10-CM

## 2024-05-02 ENCOUNTER — Ambulatory Visit: Admitting: Internal Medicine

## 2024-05-09 ENCOUNTER — Ambulatory Visit: Admitting: Physician Assistant

## 2024-05-09 ENCOUNTER — Ambulatory Visit: Admitting: Podiatry

## 2024-07-19 ENCOUNTER — Ambulatory Visit: Admitting: Podiatry
